# Patient Record
Sex: Female | Born: 1955 | Race: White | Hispanic: No | Marital: Married | Smoking: Current every day smoker
Health system: Southern US, Community
[De-identification: ages and names within clinical notes are randomized; demographics above are authoritative.]

## PROBLEM LIST (undated history)

## (undated) DIAGNOSIS — Z923 Personal history of irradiation: Secondary | ICD-10-CM

## (undated) DIAGNOSIS — Z720 Tobacco use: Secondary | ICD-10-CM

## (undated) DIAGNOSIS — C3491 Malignant neoplasm of unspecified part of right bronchus or lung: Secondary | ICD-10-CM

## (undated) DIAGNOSIS — C801 Malignant (primary) neoplasm, unspecified: Secondary | ICD-10-CM

## (undated) DIAGNOSIS — G4730 Sleep apnea, unspecified: Secondary | ICD-10-CM

## (undated) DIAGNOSIS — N179 Acute kidney failure, unspecified: Secondary | ICD-10-CM

## (undated) DIAGNOSIS — G629 Polyneuropathy, unspecified: Secondary | ICD-10-CM

## (undated) DIAGNOSIS — G8929 Other chronic pain: Secondary | ICD-10-CM

## (undated) DIAGNOSIS — E039 Hypothyroidism, unspecified: Secondary | ICD-10-CM

## (undated) DIAGNOSIS — F419 Anxiety disorder, unspecified: Secondary | ICD-10-CM

## (undated) DIAGNOSIS — K219 Gastro-esophageal reflux disease without esophagitis: Secondary | ICD-10-CM

## (undated) DIAGNOSIS — M797 Fibromyalgia: Secondary | ICD-10-CM

## (undated) DIAGNOSIS — R32 Unspecified urinary incontinence: Secondary | ICD-10-CM

## (undated) DIAGNOSIS — M5430 Sciatica, unspecified side: Secondary | ICD-10-CM

## (undated) DIAGNOSIS — L723 Sebaceous cyst: Secondary | ICD-10-CM

## (undated) DIAGNOSIS — F32A Depression, unspecified: Secondary | ICD-10-CM

## (undated) DIAGNOSIS — F329 Major depressive disorder, single episode, unspecified: Secondary | ICD-10-CM

## (undated) DIAGNOSIS — M549 Dorsalgia, unspecified: Secondary | ICD-10-CM

## (undated) DIAGNOSIS — M1990 Unspecified osteoarthritis, unspecified site: Secondary | ICD-10-CM

## (undated) DIAGNOSIS — R0600 Dyspnea, unspecified: Secondary | ICD-10-CM

## (undated) DIAGNOSIS — E7800 Pure hypercholesterolemia, unspecified: Secondary | ICD-10-CM

## (undated) DIAGNOSIS — J449 Chronic obstructive pulmonary disease, unspecified: Secondary | ICD-10-CM

## (undated) DIAGNOSIS — F418 Other specified anxiety disorders: Secondary | ICD-10-CM

## (undated) HISTORY — PX: OTHER SURGICAL HISTORY: SHX169

## (undated) HISTORY — DX: Gastro-esophageal reflux disease without esophagitis: K21.9

## (undated) HISTORY — DX: Malignant neoplasm of unspecified part of right bronchus or lung: C34.91

## (undated) HISTORY — DX: Other specified anxiety disorders: F41.8

## (undated) HISTORY — DX: Tobacco use: Z72.0

## (undated) HISTORY — DX: Polyneuropathy, unspecified: G62.9

## (undated) HISTORY — PX: BREAST LUMPECTOMY: SHX2

## (undated) HISTORY — DX: Personal history of irradiation: Z92.3

## (undated) HISTORY — DX: Unspecified urinary incontinence: R32

## (undated) HISTORY — DX: Sebaceous cyst: L72.3

---

## 2003-08-24 DIAGNOSIS — C801 Malignant (primary) neoplasm, unspecified: Secondary | ICD-10-CM

## 2003-08-24 HISTORY — DX: Malignant (primary) neoplasm, unspecified: C80.1

## 2003-11-27 ENCOUNTER — Other Ambulatory Visit: Admission: RE | Admit: 2003-11-27 | Discharge: 2003-11-27 | Payer: Self-pay | Admitting: Radiology

## 2003-12-01 DIAGNOSIS — C50911 Malignant neoplasm of unspecified site of right female breast: Secondary | ICD-10-CM | POA: Insufficient documentation

## 2003-12-09 ENCOUNTER — Encounter (INDEPENDENT_AMBULATORY_CARE_PROVIDER_SITE_OTHER): Payer: Self-pay | Admitting: *Deleted

## 2003-12-09 ENCOUNTER — Encounter (INDEPENDENT_AMBULATORY_CARE_PROVIDER_SITE_OTHER): Payer: Self-pay | Admitting: General Surgery

## 2003-12-09 ENCOUNTER — Encounter: Admission: RE | Admit: 2003-12-09 | Discharge: 2003-12-09 | Payer: Self-pay | Admitting: General Surgery

## 2003-12-09 ENCOUNTER — Ambulatory Visit (HOSPITAL_BASED_OUTPATIENT_CLINIC_OR_DEPARTMENT_OTHER): Admission: RE | Admit: 2003-12-09 | Discharge: 2003-12-09 | Payer: Self-pay | Admitting: General Surgery

## 2003-12-09 ENCOUNTER — Ambulatory Visit (HOSPITAL_COMMUNITY): Admission: RE | Admit: 2003-12-09 | Discharge: 2003-12-09 | Payer: Self-pay | Admitting: General Surgery

## 2003-12-31 ENCOUNTER — Ambulatory Visit: Admission: RE | Admit: 2003-12-31 | Discharge: 2004-01-27 | Payer: Self-pay | Admitting: Radiation Oncology

## 2004-01-01 ENCOUNTER — Encounter: Admission: RE | Admit: 2004-01-01 | Discharge: 2004-01-01 | Payer: Self-pay | Admitting: Oncology

## 2004-03-03 ENCOUNTER — Ambulatory Visit: Admission: RE | Admit: 2004-03-03 | Discharge: 2004-05-14 | Payer: Self-pay | Admitting: Radiation Oncology

## 2004-06-02 ENCOUNTER — Ambulatory Visit: Admission: RE | Admit: 2004-06-02 | Discharge: 2004-06-02 | Payer: Self-pay | Admitting: Radiation Oncology

## 2004-06-09 ENCOUNTER — Ambulatory Visit: Admission: RE | Admit: 2004-06-09 | Discharge: 2004-06-09 | Payer: Self-pay | Admitting: Radiation Oncology

## 2004-06-26 ENCOUNTER — Ambulatory Visit: Payer: Self-pay | Admitting: Oncology

## 2004-07-10 ENCOUNTER — Ambulatory Visit: Payer: Self-pay | Admitting: Internal Medicine

## 2004-11-03 ENCOUNTER — Ambulatory Visit: Payer: Self-pay | Admitting: Oncology

## 2004-11-11 ENCOUNTER — Ambulatory Visit: Admission: RE | Admit: 2004-11-11 | Discharge: 2004-11-11 | Payer: Self-pay | Admitting: Radiation Oncology

## 2005-03-21 ENCOUNTER — Emergency Department (HOSPITAL_COMMUNITY): Admission: EM | Admit: 2005-03-21 | Discharge: 2005-03-21 | Payer: Self-pay | Admitting: *Deleted

## 2005-05-20 ENCOUNTER — Ambulatory Visit: Payer: Self-pay | Admitting: Oncology

## 2005-07-06 ENCOUNTER — Ambulatory Visit: Payer: Self-pay | Admitting: Internal Medicine

## 2005-07-13 ENCOUNTER — Ambulatory Visit: Payer: Self-pay | Admitting: Internal Medicine

## 2005-07-19 ENCOUNTER — Ambulatory Visit: Payer: Self-pay | Admitting: *Deleted

## 2005-11-19 ENCOUNTER — Ambulatory Visit: Payer: Self-pay | Admitting: Oncology

## 2006-03-22 ENCOUNTER — Ambulatory Visit: Payer: Self-pay | Admitting: Family Medicine

## 2006-04-17 ENCOUNTER — Emergency Department (HOSPITAL_COMMUNITY): Admission: EM | Admit: 2006-04-17 | Discharge: 2006-04-17 | Payer: Self-pay | Admitting: Emergency Medicine

## 2006-05-06 ENCOUNTER — Ambulatory Visit: Payer: Self-pay | Admitting: Oncology

## 2006-05-26 ENCOUNTER — Ambulatory Visit: Payer: Self-pay | Admitting: Family Medicine

## 2006-06-01 ENCOUNTER — Ambulatory Visit: Payer: Self-pay | Admitting: Family Medicine

## 2006-06-08 ENCOUNTER — Ambulatory Visit: Payer: Self-pay | Admitting: Family Medicine

## 2006-07-19 ENCOUNTER — Ambulatory Visit: Payer: Self-pay | Admitting: Family Medicine

## 2006-08-17 ENCOUNTER — Ambulatory Visit: Payer: Self-pay | Admitting: Obstetrics & Gynecology

## 2006-08-17 DIAGNOSIS — L723 Sebaceous cyst: Secondary | ICD-10-CM

## 2006-10-18 ENCOUNTER — Ambulatory Visit: Payer: Self-pay | Admitting: Family Medicine

## 2006-11-07 ENCOUNTER — Ambulatory Visit: Payer: Self-pay | Admitting: Family Medicine

## 2006-12-09 ENCOUNTER — Ambulatory Visit: Payer: Self-pay | Admitting: Oncology

## 2006-12-14 LAB — CBC WITH DIFFERENTIAL/PLATELET
BASO%: 0.8 % (ref 0.0–2.0)
HCT: 34.7 % — ABNORMAL LOW (ref 34.8–46.6)
LYMPH%: 32.3 % (ref 14.0–48.0)
MCHC: 35.4 g/dL (ref 32.0–36.0)
MCV: 88.8 fL (ref 81.0–101.0)
MONO#: 0.5 10*3/uL (ref 0.1–0.9)
MONO%: 7.1 % (ref 0.0–13.0)
NEUT%: 57.6 % (ref 39.6–76.8)
Platelets: 225 10*3/uL (ref 145–400)
RBC: 3.9 10*6/uL (ref 3.70–5.32)
WBC: 6.6 10*3/uL (ref 3.9–10.0)

## 2007-01-06 ENCOUNTER — Encounter: Admission: RE | Admit: 2007-01-06 | Discharge: 2007-01-06 | Payer: Self-pay | Admitting: Family Medicine

## 2007-01-10 ENCOUNTER — Ambulatory Visit: Payer: Self-pay | Admitting: Family Medicine

## 2007-01-10 ENCOUNTER — Encounter (INDEPENDENT_AMBULATORY_CARE_PROVIDER_SITE_OTHER): Payer: Self-pay | Admitting: Family Medicine

## 2007-01-10 DIAGNOSIS — N8111 Cystocele, midline: Secondary | ICD-10-CM | POA: Insufficient documentation

## 2007-01-10 DIAGNOSIS — R32 Unspecified urinary incontinence: Secondary | ICD-10-CM | POA: Insufficient documentation

## 2007-01-10 LAB — CONVERTED CEMR LAB: Pap Smear: NORMAL

## 2007-01-30 ENCOUNTER — Encounter (INDEPENDENT_AMBULATORY_CARE_PROVIDER_SITE_OTHER): Payer: Self-pay | Admitting: Family Medicine

## 2007-01-30 DIAGNOSIS — F329 Major depressive disorder, single episode, unspecified: Secondary | ICD-10-CM

## 2007-01-30 DIAGNOSIS — K219 Gastro-esophageal reflux disease without esophagitis: Secondary | ICD-10-CM | POA: Insufficient documentation

## 2007-01-30 DIAGNOSIS — F17200 Nicotine dependence, unspecified, uncomplicated: Secondary | ICD-10-CM | POA: Insufficient documentation

## 2007-01-30 DIAGNOSIS — F411 Generalized anxiety disorder: Secondary | ICD-10-CM | POA: Insufficient documentation

## 2007-01-30 DIAGNOSIS — F3289 Other specified depressive episodes: Secondary | ICD-10-CM | POA: Insufficient documentation

## 2007-05-10 ENCOUNTER — Encounter (INDEPENDENT_AMBULATORY_CARE_PROVIDER_SITE_OTHER): Payer: Self-pay | Admitting: *Deleted

## 2007-06-08 ENCOUNTER — Telehealth (INDEPENDENT_AMBULATORY_CARE_PROVIDER_SITE_OTHER): Payer: Self-pay | Admitting: Internal Medicine

## 2007-06-12 ENCOUNTER — Ambulatory Visit: Payer: Self-pay | Admitting: Oncology

## 2007-07-05 ENCOUNTER — Ambulatory Visit: Payer: Self-pay | Admitting: Family Medicine

## 2007-07-13 ENCOUNTER — Telehealth (INDEPENDENT_AMBULATORY_CARE_PROVIDER_SITE_OTHER): Payer: Self-pay | Admitting: Nurse Practitioner

## 2007-07-24 ENCOUNTER — Ambulatory Visit: Payer: Self-pay | Admitting: Family Medicine

## 2007-07-24 ENCOUNTER — Telehealth (INDEPENDENT_AMBULATORY_CARE_PROVIDER_SITE_OTHER): Payer: Self-pay | Admitting: Family Medicine

## 2007-08-09 ENCOUNTER — Ambulatory Visit: Payer: Self-pay | Admitting: Oncology

## 2007-09-28 ENCOUNTER — Ambulatory Visit: Payer: Self-pay | Admitting: Oncology

## 2007-11-15 ENCOUNTER — Telehealth (INDEPENDENT_AMBULATORY_CARE_PROVIDER_SITE_OTHER): Payer: Self-pay | Admitting: Nurse Practitioner

## 2007-11-21 ENCOUNTER — Telehealth (INDEPENDENT_AMBULATORY_CARE_PROVIDER_SITE_OTHER): Payer: Self-pay | Admitting: *Deleted

## 2007-11-22 ENCOUNTER — Ambulatory Visit: Payer: Self-pay | Admitting: Family Medicine

## 2007-11-22 DIAGNOSIS — M542 Cervicalgia: Secondary | ICD-10-CM | POA: Insufficient documentation

## 2007-11-22 DIAGNOSIS — Q742 Other congenital malformations of lower limb(s), including pelvic girdle: Secondary | ICD-10-CM

## 2007-12-08 ENCOUNTER — Encounter (INDEPENDENT_AMBULATORY_CARE_PROVIDER_SITE_OTHER): Payer: Self-pay | Admitting: Family Medicine

## 2007-12-20 ENCOUNTER — Ambulatory Visit: Payer: Self-pay | Admitting: Oncology

## 2007-12-25 ENCOUNTER — Telehealth (INDEPENDENT_AMBULATORY_CARE_PROVIDER_SITE_OTHER): Payer: Self-pay | Admitting: *Deleted

## 2007-12-25 ENCOUNTER — Encounter (INDEPENDENT_AMBULATORY_CARE_PROVIDER_SITE_OTHER): Payer: Self-pay | Admitting: Family Medicine

## 2007-12-25 LAB — CBC WITH DIFFERENTIAL/PLATELET
BASO%: 0.5 % (ref 0.0–2.0)
EOS%: 2.3 % (ref 0.0–7.0)
Eosinophils Absolute: 0.2 10*3/uL (ref 0.0–0.5)
LYMPH%: 23.7 % (ref 14.0–48.0)
MCH: 31.5 pg (ref 26.0–34.0)
MCHC: 35 g/dL (ref 32.0–36.0)
MCV: 90.1 fL (ref 81.0–101.0)
MONO%: 5.2 % (ref 0.0–13.0)
NEUT#: 6 10*3/uL (ref 1.5–6.5)
Platelets: 225 10*3/uL (ref 145–400)
RBC: 3.75 10*6/uL (ref 3.70–5.32)
RDW: 13.8 % (ref 11.3–14.5)

## 2007-12-25 LAB — COMPREHENSIVE METABOLIC PANEL
ALT: <8 U/L (ref 0–35)
AST: 11 U/L (ref 0–37)
Albumin: 3.8 g/dL (ref 3.5–5.2)
Alkaline Phosphatase: 63 U/L (ref 39–117)
Potassium: 3.8 mEq/L (ref 3.5–5.3)
Sodium: 139 mEq/L (ref 135–145)
Total Bilirubin: 0.3 mg/dL (ref 0.3–1.2)
Total Protein: 6.8 g/dL (ref 6.0–8.3)

## 2008-01-23 ENCOUNTER — Ambulatory Visit: Payer: Self-pay | Admitting: Family Medicine

## 2008-01-23 DIAGNOSIS — H6120 Impacted cerumen, unspecified ear: Secondary | ICD-10-CM | POA: Insufficient documentation

## 2008-01-23 DIAGNOSIS — H9319 Tinnitus, unspecified ear: Secondary | ICD-10-CM | POA: Insufficient documentation

## 2008-01-31 ENCOUNTER — Telehealth (INDEPENDENT_AMBULATORY_CARE_PROVIDER_SITE_OTHER): Payer: Self-pay | Admitting: *Deleted

## 2008-02-07 ENCOUNTER — Encounter: Admission: RE | Admit: 2008-02-07 | Discharge: 2008-02-07 | Payer: Self-pay | Admitting: Family Medicine

## 2008-02-08 ENCOUNTER — Encounter (INDEPENDENT_AMBULATORY_CARE_PROVIDER_SITE_OTHER): Payer: Self-pay | Admitting: Family Medicine

## 2008-02-26 ENCOUNTER — Encounter (INDEPENDENT_AMBULATORY_CARE_PROVIDER_SITE_OTHER): Payer: Self-pay | Admitting: Family Medicine

## 2008-02-26 ENCOUNTER — Ambulatory Visit: Payer: Self-pay | Admitting: Family Medicine

## 2008-02-26 DIAGNOSIS — G4700 Insomnia, unspecified: Secondary | ICD-10-CM | POA: Insufficient documentation

## 2008-02-26 LAB — CONVERTED CEMR LAB
ALT: <8 units/L (ref 0–35)
AST: 11 units/L (ref 0–37)
Alkaline Phosphatase: 58 units/L (ref 39–117)
CO2: 24 meq/L (ref 19–32)
Cholesterol: 220 mg/dL — ABNORMAL HIGH (ref 0–200)
Creatinine, Ser: 0.74 mg/dL (ref 0.40–1.20)
Glucose, Urine, Semiquant: NEGATIVE
LDL Cholesterol: 124 mg/dL — ABNORMAL HIGH (ref 0–99)
Sodium: 141 meq/L (ref 135–145)
Specific Gravity, Urine: >=1.03
Total Bilirubin: 0.3 mg/dL (ref 0.3–1.2)
Total CHOL/HDL Ratio: 5.4
VLDL: 55 mg/dL — ABNORMAL HIGH (ref 0–40)
WBC Urine, dipstick: NEGATIVE
pH: 5

## 2008-02-27 LAB — CONVERTED CEMR LAB: Pap Smear: NORMAL

## 2008-03-06 ENCOUNTER — Ambulatory Visit: Payer: Self-pay | Admitting: Family Medicine

## 2008-03-08 ENCOUNTER — Telehealth (INDEPENDENT_AMBULATORY_CARE_PROVIDER_SITE_OTHER): Payer: Self-pay | Admitting: Nurse Practitioner

## 2008-03-13 ENCOUNTER — Encounter (INDEPENDENT_AMBULATORY_CARE_PROVIDER_SITE_OTHER): Payer: Self-pay | Admitting: Family Medicine

## 2008-03-14 ENCOUNTER — Encounter (INDEPENDENT_AMBULATORY_CARE_PROVIDER_SITE_OTHER): Payer: Self-pay | Admitting: Family Medicine

## 2008-03-20 ENCOUNTER — Encounter (INDEPENDENT_AMBULATORY_CARE_PROVIDER_SITE_OTHER): Payer: Self-pay | Admitting: Family Medicine

## 2008-04-15 ENCOUNTER — Telehealth (INDEPENDENT_AMBULATORY_CARE_PROVIDER_SITE_OTHER): Payer: Self-pay | Admitting: Nurse Practitioner

## 2008-04-28 ENCOUNTER — Emergency Department (HOSPITAL_COMMUNITY): Admission: EM | Admit: 2008-04-28 | Discharge: 2008-04-28 | Payer: Self-pay | Admitting: Emergency Medicine

## 2008-04-30 ENCOUNTER — Telehealth (INDEPENDENT_AMBULATORY_CARE_PROVIDER_SITE_OTHER): Payer: Self-pay | Admitting: *Deleted

## 2008-05-06 ENCOUNTER — Ambulatory Visit: Payer: Self-pay | Admitting: Family Medicine

## 2008-05-16 ENCOUNTER — Ambulatory Visit (HOSPITAL_COMMUNITY): Admission: RE | Admit: 2008-05-16 | Discharge: 2008-05-16 | Payer: Self-pay | Admitting: Pediatrics

## 2008-05-17 ENCOUNTER — Encounter (INDEPENDENT_AMBULATORY_CARE_PROVIDER_SITE_OTHER): Payer: Self-pay | Admitting: Family Medicine

## 2008-09-05 ENCOUNTER — Ambulatory Visit (HOSPITAL_COMMUNITY): Admission: RE | Admit: 2008-09-05 | Discharge: 2008-09-05 | Payer: Self-pay | Admitting: Pediatrics

## 2008-09-25 ENCOUNTER — Encounter (HOSPITAL_COMMUNITY): Admission: RE | Admit: 2008-09-25 | Discharge: 2008-10-25 | Payer: Self-pay | Admitting: Pediatrics

## 2008-10-03 ENCOUNTER — Ambulatory Visit (HOSPITAL_COMMUNITY): Admission: RE | Admit: 2008-10-03 | Discharge: 2008-10-03 | Payer: Self-pay | Admitting: Pediatrics

## 2008-10-04 ENCOUNTER — Ambulatory Visit (HOSPITAL_COMMUNITY): Admission: RE | Admit: 2008-10-04 | Discharge: 2008-10-04 | Payer: Self-pay | Admitting: Urology

## 2008-11-04 ENCOUNTER — Ambulatory Visit (HOSPITAL_COMMUNITY): Admission: RE | Admit: 2008-11-04 | Discharge: 2008-11-04 | Payer: Self-pay | Admitting: Pediatrics

## 2008-12-19 ENCOUNTER — Ambulatory Visit: Payer: Self-pay | Admitting: Oncology

## 2009-05-02 ENCOUNTER — Ambulatory Visit: Payer: Self-pay | Admitting: Oncology

## 2009-05-06 LAB — CBC WITH DIFFERENTIAL/PLATELET
BASO%: 0.4 % (ref 0.0–2.0)
Basophils Absolute: 0 10*3/uL (ref 0.0–0.1)
HCT: 42.1 % (ref 34.8–46.6)
HGB: 14.2 g/dL (ref 11.6–15.9)
MONO#: 0.4 10*3/uL (ref 0.1–0.9)
NEUT%: 73.7 % (ref 38.4–76.8)
RDW: 13.2 % (ref 11.2–14.5)
WBC: 9.4 10*3/uL (ref 3.9–10.3)
lymph#: 1.9 10*3/uL (ref 0.9–3.3)

## 2009-05-06 LAB — COMPREHENSIVE METABOLIC PANEL
AST: 19 U/L (ref 0–37)
Albumin: 3.7 g/dL (ref 3.5–5.2)
BUN: 7 mg/dL (ref 6–23)
Calcium: 9.1 mg/dL (ref 8.4–10.5)
Chloride: 107 mEq/L (ref 96–112)
Glucose, Bld: 119 mg/dL — ABNORMAL HIGH (ref 70–99)
Potassium: 3.5 mEq/L (ref 3.5–5.3)
Sodium: 143 mEq/L (ref 135–145)
Total Protein: 6.9 g/dL (ref 6.0–8.3)

## 2010-01-05 ENCOUNTER — Emergency Department (HOSPITAL_COMMUNITY): Admission: EM | Admit: 2010-01-05 | Discharge: 2010-01-05 | Payer: Self-pay | Admitting: Emergency Medicine

## 2010-04-28 ENCOUNTER — Inpatient Hospital Stay (HOSPITAL_COMMUNITY): Admission: RE | Admit: 2010-04-28 | Discharge: 2010-04-29 | Payer: Self-pay | Admitting: Urology

## 2010-04-28 ENCOUNTER — Ambulatory Visit: Payer: Self-pay | Admitting: Cardiology

## 2010-06-17 ENCOUNTER — Ambulatory Visit (HOSPITAL_COMMUNITY): Admission: RE | Admit: 2010-06-17 | Discharge: 2010-06-17 | Payer: Self-pay | Admitting: Pediatrics

## 2010-09-08 ENCOUNTER — Emergency Department (HOSPITAL_COMMUNITY)
Admission: EM | Admit: 2010-09-08 | Discharge: 2010-09-08 | Payer: Self-pay | Source: Home / Self Care | Admitting: Emergency Medicine

## 2010-09-13 ENCOUNTER — Encounter: Payer: Self-pay | Admitting: Pediatrics

## 2010-09-13 ENCOUNTER — Encounter: Payer: Self-pay | Admitting: Family Medicine

## 2010-09-14 ENCOUNTER — Encounter: Payer: Self-pay | Admitting: Oncology

## 2010-10-23 ENCOUNTER — Encounter: Payer: Self-pay | Admitting: Internal Medicine

## 2010-10-23 ENCOUNTER — Telehealth (INDEPENDENT_AMBULATORY_CARE_PROVIDER_SITE_OTHER): Payer: Self-pay | Admitting: *Deleted

## 2010-10-27 ENCOUNTER — Encounter: Payer: Self-pay | Admitting: Internal Medicine

## 2010-10-29 NOTE — Progress Notes (Signed)
  Phone Note Outgoing Call Call back at [phone]   Call placed by: Carolan Clines  Summary of Call: phone answered by room mate and message left to return call when pt arrives home     Appended Document:  pt returned call and tcs scrrening complete and forwarded to scheduler

## 2010-11-03 NOTE — Letter (Signed)
Summary: TRIAGE ORDER  TRIAGE ORDER   Imported By: Ave Filter 10/23/2010 16:44:36  _____________________________________________________________________  External Attachment:    Type:   Image     Comment:   External Document  Appended Document: TRIAGE ORDER ok as is  Appended Document: TRIAGE ORDER Instrctions mailed to pt.

## 2010-11-03 NOTE — Letter (Signed)
Summary: [facility] INSTRUCTIONS  [facility] INSTRUCTIONS   Imported By: Ave Filter 10/27/2010 08:56:08  _____________________________________________________________________  External Attachment:    Type:   Image     Comment:   External Document

## 2010-11-05 LAB — SURGICAL PCR SCREEN
MRSA, PCR: NEGATIVE
Staphylococcus aureus: NEGATIVE

## 2010-11-05 LAB — CARDIAC PANEL(CRET KIN+CKTOT+MB+TROPI)
CK, MB: 0.9 ng/mL (ref 0.3–4.0)
CK, MB: 0.9 ng/mL (ref 0.3–4.0)
Relative Index: INVALID (ref 0.0–2.5)
Total CK: 58 U/L (ref 7–177)
Troponin I: <0.01 ng/mL (ref 0.00–0.06)

## 2010-11-05 LAB — CBC
MCH: 30.7 pg (ref 26.0–34.0)
MCV: 90.8 fL (ref 78.0–100.0)
Platelets: 255 10*3/uL (ref 150–400)
RDW: 13.1 % (ref 11.5–15.5)
WBC: 8.5 10*3/uL (ref 4.0–10.5)

## 2010-11-05 LAB — BASIC METABOLIC PANEL
BUN: 3 mg/dL — ABNORMAL LOW (ref 6–23)
Calcium: 9.6 mg/dL (ref 8.4–10.5)
Chloride: 102 mEq/L (ref 96–112)
Creatinine, Ser: 0.64 mg/dL (ref 0.4–1.2)
GFR calc Af Amer: >60 mL/min (ref >60)
GFR calc non Af Amer: >60 mL/min (ref >60)
Potassium: 3.4 mEq/L — ABNORMAL LOW (ref 3.5–5.1)
Sodium: 140 mEq/L (ref 135–145)

## 2010-11-05 LAB — D-DIMER, QUANTITATIVE: D-Dimer, Quant: 0.46 ug/mL-FEU (ref 0.00–0.48)

## 2010-11-11 ENCOUNTER — Other Ambulatory Visit: Payer: Self-pay | Admitting: Internal Medicine

## 2010-11-11 ENCOUNTER — Ambulatory Visit (HOSPITAL_COMMUNITY)
Admission: RE | Admit: 2010-11-11 | Discharge: 2010-11-11 | Disposition: A | Discharge status: Home / Self Care | Payer: Medicaid Other | Source: Ambulatory Visit | Attending: Internal Medicine | Admitting: Internal Medicine

## 2010-11-11 ENCOUNTER — Encounter: Payer: Medicaid Other | Admitting: Internal Medicine

## 2010-11-11 DIAGNOSIS — D126 Benign neoplasm of colon, unspecified: Secondary | ICD-10-CM | POA: Insufficient documentation

## 2010-11-11 DIAGNOSIS — K219 Gastro-esophageal reflux disease without esophagitis: Secondary | ICD-10-CM

## 2010-11-11 DIAGNOSIS — R1310 Dysphagia, unspecified: Secondary | ICD-10-CM

## 2010-11-11 DIAGNOSIS — K449 Diaphragmatic hernia without obstruction or gangrene: Secondary | ICD-10-CM | POA: Insufficient documentation

## 2010-11-11 DIAGNOSIS — Z1211 Encounter for screening for malignant neoplasm of colon: Secondary | ICD-10-CM

## 2010-11-11 HISTORY — PX: ESOPHAGOGASTRODUODENOSCOPY: SHX1529

## 2010-11-11 HISTORY — PX: COLONOSCOPY: SHX174

## 2010-11-16 ENCOUNTER — Encounter: Payer: Self-pay | Admitting: Gastroenterology

## 2010-11-17 NOTE — Op Note (Signed)
NAME:  Sydney Blair, Sydney Blair           ACCOUNT NO.:  [id number]  MEDICAL RECORD NO.:  [id number]           PATIENT TYPE:  O  LOCATION:  [facility]                          FACILITY:  [facility]  PHYSICIAN:  R. Roetta Sessions, M.D. DATE OF BIRTH:  [birth year]  DATE OF PROCEDURE:  11/10/2010 DATE OF DISCHARGE:                              OPERATIVE REPORT   INDICATIONS FOR PROCEDURE:  This is a 55 year old lady referred at courtesy Dr. Ancil Boozer for first ever screening colonoscopy.  She has no lower GI tract symptoms.  No family history of colon cancer or colon polyps.  This approach has been discussed with the patient the risks, benefits, limitations, alternatives, and ponderables..  Sydney Blair relates that she has had longstanding GERD, well controlled on Nexium/omeprazole, but over the past 3 months developed esophageal dysphagia to solids and liquids and wonders if she could have her upper GI tract evaluated at the same time.  This approach is not unreasonable. I did discuss the possibility of EGD with dilation, etc., versus alternative of pill esophagram with this nice lady.  We talked about the risks, benefits, and alternatives of the EGD.  She is desirous of having an EGD with intervention as appropriate today at the time as her colonoscopy.  Please see the documentation of medical record.  PROCEDURE NOTE:  O2 saturation, blood pressure, pulse, and respirations were monitored throughout the entire procedure.  CONSCIOUS SEDATION:  Versed 7 mg IV, Demerol 150 mg IV in divided doses, Cetacaine spray for topical pharyngeal anesthesia.  INSTRUMENTATION:  Pentax video chip system.  FINDINGS:  EGD examination of tubular esophagus revealed no mucosal abnormalities.  Esophagus.  The tubular esophagus was patent through the EG junction.  Stomach:  Gas cavity was emptied and insufflated well with air. Thorough examination of the gastric mucosa including retroflexion of the proximal stomach  and esophagogastric junction demonstrated only a small hiatal hernia.  The gastric mucosa was well seen and appeared normal, otherwise.  Pylorus was patent and easily traversed.  Examination of the bulb and second portion revealed no abnormalities.  Therapeutic/diagnostic maneuvers performed:  Scope was withdrawn.  A 56- French Maloney dye was passed to full insertion with ease.  Look back revealed no apparent complication related to passage of the dilator. The patient tolerated the procedure well and was prepared for colonoscopy.  Digital rectal exam revealed no abnormalities.  Endoscopic findings:  Prep was adequate.  Colon:  Colonic mucosa was surveyed from the rectosigmoid junction through the left transverse, right colon, through the appendiceal orifice, ileocecal valve/cecum.  These structures were well seen and photographed for the record.  Terminal ileum was intubated to 5 cm. From this level, scope was slowly cautiously withdrawn.  All previous mentioned mucosal surfaces were again seen.  The patient was noted to have a 7-mm polyp at the splenic flexure with hot snare removed.  The remainder of colonic mucosa appeared normal.  Scope was pulled down into the rectum.  A thorough examination of the rectal mucosa including retroflexed view of the anal verge demonstrated 2 distal diminutive polyps which were ablated with the tip of the hot snare cautery unit.  No other rectal mucosal abnormalities were seen.  The patient tolerated the procedure well.  Cecal withdrawal time 16 minutes.  IMPRESSION: 1. Esophagogastroduodenoscopy, small hiatal hernia, otherwise normal     upper gastrointestinal tract status with passage of 56-French     Maloney dilator.  Colonoscopy findings, diminutive rectal polyp     status post hot snare ablation, otherwise normal rectum. 2. Polyp at splenic flexure status post snare polypectomy.  Remaining     colonic mucosa appeared  normal.  RECOMMENDATIONS: 1. Literature on reflux, hiatal hernia, and polyps provided to Ms.     Turkey today. 2. She is to continue her Nexium. 3. Follow up on path. 4. Further recommendations to follow.     Jonathon Bellows, M.D.     RMR/MEDQ  D:  11/11/2010  T:  11/11/2010  Job:  [id number]  cc:   Ancil Boozer, MD Fax: [phone]  Electronically Signed by Lorrin Goodell M.D. on 11/17/2010 11:23:44 AM

## 2010-12-07 ENCOUNTER — Ambulatory Visit: Payer: Medicaid Other | Attending: Pediatrics

## 2010-12-07 DIAGNOSIS — R0989 Other specified symptoms and signs involving the circulatory and respiratory systems: Secondary | ICD-10-CM | POA: Insufficient documentation

## 2010-12-07 DIAGNOSIS — R0609 Other forms of dyspnea: Secondary | ICD-10-CM | POA: Insufficient documentation

## 2010-12-07 DIAGNOSIS — R5381 Other malaise: Secondary | ICD-10-CM | POA: Insufficient documentation

## 2010-12-07 DIAGNOSIS — R5383 Other fatigue: Secondary | ICD-10-CM | POA: Insufficient documentation

## 2010-12-07 DIAGNOSIS — G4733 Obstructive sleep apnea (adult) (pediatric): Secondary | ICD-10-CM | POA: Insufficient documentation

## 2010-12-08 NOTE — Procedures (Signed)
NAME:  Sydney Blair, Sydney Blair           ACCOUNT NO.:  [id number]  MEDICAL RECORD NO.:  [id number]          PATIENT TYPE:  OUT  LOCATION:  SLEEP LAB                     FACILITY:  [facility]  PHYSICIAN:  Shavonna Corella A. Gerilyn Pilgrim, M.D. DATE OF BIRTH:  March 25, 1956  DATE OF STUDY:  12/07/2010                           NOCTURNAL POLYSOMNOGRAM  REFERRING PHYSICIAN:  Francoise Schaumann. Halm, DO, FAAP  REFERRING PHYSICIAN:  Francoise Schaumann. Halm, DO, FAAP.  RECORDING DATE:  December 07, 2010.  INDICATIONS:  A 55 year old who presents with fatigue, snoring and difficulty sleeping.  This has been done to evaluate for obstructive sleep apnea syndrome.  INDICATION FOR STUDY:  EPWORTH SLEEPINESS SCORE:  MEDICATIONS:  Clonazepam, tramadol, Atrovent, omeprazole, Flonase, prednisone, albuterol.  EPWORTH SLEEPINESS SCALE:  5.  BMI:  29.  ARCHITECTURAL SUMMARY:  Total recording time was 4-21 minutes.  Sleep efficiency 89%.  Sleep latency 13 minutes.  REM latency 0.  Stage N1 4.3%, N2 is 59.6%, N3 36.2% and REM sleep 0.  RESPIRATORY SUMMARY:  Baseline oxygen saturation is 90, lowest saturation 81 during non REM sleep.  Diagnostic AHI is 20 and RDI 21.  LIMB MOVEMENT SUMMARY:  PLM index 0.  ELECTROCARDIOGRAM SUMMARY:  Average heart rate 78 with no significant dysrhythmias observed.  IMPRESSION: 1. Moderate obstructive sleep apnea syndrome. 2. Abnormal sleep architecture with significantly increased slow wave     sleep suggestive of sleep deprivation.  RECOMMENDATIONS:  Formal CPAP titration study.  SLEEP ARCHITECTURE:  RESPIRATORY DATA:  OXYGEN DATA:  CARDIAC DATA:  MOVEMENT-PARASOMNIA:  IMPRESSIONS-RECOMMENDATIONS:     [name] A. Gerilyn Pilgrim, M.D. Electronically Signed 12/09/2010 10:03:08    KAD/MEDQ  D:  12/08/2010 09:23:55  T:  12/08/2010 21:43:36  Job:  [id number]

## 2011-01-08 ENCOUNTER — Other Ambulatory Visit (HOSPITAL_COMMUNITY): Payer: Self-pay | Admitting: Pediatrics

## 2011-01-08 DIAGNOSIS — N63 Unspecified lump in unspecified breast: Secondary | ICD-10-CM

## 2011-01-08 DIAGNOSIS — Z780 Asymptomatic menopausal state: Secondary | ICD-10-CM

## 2011-01-08 NOTE — Group Therapy Note (Signed)
NAME:  Sydney Blair, Sydney Blair NO.:  [id number]   MEDICAL RECORD NO.:  [id number]          PATIENT TYPE:  WOC   LOCATION:  [facility]                   FACILITY:  [facility]   PHYSICIAN:  Dorthula Perfect, MD     DATE OF BIRTH:  1956-06-22   DATE OF SERVICE:                                  CLINIC NOTE   A 55 year old white female gravida 2, para 2, last menstrual period  2005, is referred here from [facility] for evaluation of a left labial  cyst.  In 2005 she had a right breast lumpectomy performed for breast  cancer.  She has received chemotherapy and radiation therapy.  She is on  tamoxifen. She is followed by Dr. Darnelle Catalan as far as the breast cancer.  She thinks this area on the left labia has been present for at least 6  months.  It is nontender.  It has not changed in size.   She has been menopausal since 2005 and has had no vaginal bleeding or  spotting.  Her last Pap smear was 1 year ago, and by her history, was  normal.   PHYSICAL EXAM:  Weight 167, height 5 feet 3 inches.  Blood pressure  151/85.  ABDOMEN:  Flat and nontender.  No masses are felt.  Both inguinal areas  are completely normal.  No adenopathy is noted.  PELVIC:  External genitalia were examined.  In the upper left labia is  an area that is about 13-14 mm by 6-7 mm.  It is nontender.  It is  freely mobile.  It is not superficial.  It feels like a sebaceous cyst.  I had Dr. Okey Dupre also examine her and he feels the same way.  The rest of  her pelvic exam was not performed.   DIAGNOSES:  Sebaceous cyst, left labia.   DISPOSITION:  1. Reassurance.  2. The patient is asked to return if this area gets markedly larger      and/or symptomatic, such as being painful or tender.           ______________________________  Dorthula Perfect, MD     ER/MEDQ  D:  08/17/2006  T:  08/17/2006  Job:  [id number]   cc:   [facility]

## 2011-01-08 NOTE — Op Note (Signed)
NAMEANAEL, ROSCH                ACCOUNT NO.:  [id number]   MEDICAL RECORD NO.:  [id number]                   PATIENT TYPE:  AMB   LOCATION:  [facility]                                  FACILITY:  [facility]   PHYSICIAN:  Rose Phi. Maple Hudson, M.D.                DATE OF BIRTH:  1956-01-29   DATE OF PROCEDURE:  12/09/2003  DATE OF DISCHARGE:                                 OPERATIVE REPORT   PREOPERATIVE DIAGNOSIS:  Stage I carcinoma of the right breast.   POSTOPERATIVE DIAGNOSIS:  Stage I carcinoma of the right breast.   OPERATION PERFORMED:  1. Blue dye injection.  2. Right sentinel lymph node biopsy.  3. Right partial mastectomy.   SURGEON:  Rose Phi. Maple Hudson, M.D.   ANESTHESIA:  General.   DESCRIPTION OF PROCEDURE:  Prior to coming to the operating room 1 mCi of  technetium sulfur colloid was injected intradermally.  After suitable  general anesthesia was induced, 5 mL of a mixture of 2 mL of methylene blue  and 3 mL of injectable saline were injected in the subareolar tissue and the  breast gently massaged for about three minutes.  With both arms extended on  the arm board, the breast and axilla were prepped and draped in the standard  fashion.  A short transverse right axillary incision was made with  dissection through the subcutaneous tissue to the clavipectoral fascia.  Just deep to the clavipectoral fascia, one could see a blue lymphatic going  into a fairly small blue and hot lymph node.  The counts were in excess of  1000.  The lymphatics were clipped and the node removed. There were no other  blue, hot or palpable nodes.  That was submitted to the pathologist.   While that was being evaluated, a curved incision incorporating a wedge of  skin and the palpable mass in the upper inner quadrant of the right breast  was made and a wide excision of the mass was carried out.  Specimen was  oriented for the pathologist.  Touch Preps for the margins were clean and  Touch Preps  showed the lymph node to be negative.  Both incisions were then  injected with 0.25% Marcaine.  They both were closed in two layers with 3-0  Vicryl and subcuticular 4-0 Monocryl and Steri-Strips.  Dressing applied.  The patient was then transferred to the recovery room in satisfactory  condition having tolerated the procedure well.                                               Rose Phi. Maple Hudson, M.D.    PRY/MEDQ  D:  12/09/2003  T:  12/10/2003  Job:  [id number]

## 2011-01-13 ENCOUNTER — Ambulatory Visit (HOSPITAL_COMMUNITY): Payer: Medicaid Other

## 2011-01-13 ENCOUNTER — Other Ambulatory Visit (HOSPITAL_COMMUNITY): Payer: Medicaid Other

## 2011-01-20 ENCOUNTER — Encounter (HOSPITAL_COMMUNITY): Payer: Self-pay

## 2011-01-20 ENCOUNTER — Ambulatory Visit (HOSPITAL_COMMUNITY)
Admission: RE | Admit: 2011-01-20 | Discharge: 2011-01-20 | Disposition: A | Discharge status: Home / Self Care | Payer: Medicaid Other | Source: Ambulatory Visit | Attending: Pediatrics | Admitting: Pediatrics

## 2011-01-20 DIAGNOSIS — M899 Disorder of bone, unspecified: Secondary | ICD-10-CM | POA: Insufficient documentation

## 2011-01-20 DIAGNOSIS — Z780 Asymptomatic menopausal state: Secondary | ICD-10-CM

## 2011-01-20 DIAGNOSIS — N63 Unspecified lump in unspecified breast: Secondary | ICD-10-CM | POA: Insufficient documentation

## 2011-01-20 DIAGNOSIS — M949 Disorder of cartilage, unspecified: Secondary | ICD-10-CM | POA: Insufficient documentation

## 2011-01-20 HISTORY — DX: Malignant (primary) neoplasm, unspecified: C80.1

## 2011-07-08 ENCOUNTER — Ambulatory Visit: Payer: Medicaid Other | Admitting: Gastroenterology

## 2011-07-20 ENCOUNTER — Encounter: Payer: Self-pay | Admitting: Gastroenterology

## 2011-07-20 ENCOUNTER — Ambulatory Visit (INDEPENDENT_AMBULATORY_CARE_PROVIDER_SITE_OTHER): Payer: Medicaid Other | Admitting: Gastroenterology

## 2011-07-20 ENCOUNTER — Other Ambulatory Visit: Payer: Self-pay | Admitting: Gastroenterology

## 2011-07-20 VITALS — BP 129/77 | HR 87 | Temp 97.0°F | Ht 62.0 in | Wt 189.2 lb

## 2011-07-20 DIAGNOSIS — K219 Gastro-esophageal reflux disease without esophagitis: Secondary | ICD-10-CM

## 2011-07-20 DIAGNOSIS — D369 Benign neoplasm, unspecified site: Secondary | ICD-10-CM | POA: Insufficient documentation

## 2011-07-20 MED ORDER — DEXLANSOPRAZOLE 60 MG PO CPDR: 60.0000 mg | DELAYED_RELEASE_CAPSULE | Freq: Every day | ORAL | Status: DC

## 2011-07-20 MED ORDER — PANTOPRAZOLE SODIUM 40 MG PO TBEC: 40.0000 mg | DELAYED_RELEASE_TABLET | Freq: Every day | ORAL | Status: DC

## 2011-07-20 NOTE — Progress Notes (Signed)
Referring Provider: Ara Kussmaul, MD Primary Care Physician:  Ara Kussmaul, MD Primary Gastroenterologist: Dr. Jena Gauss   Chief Complaint  Patient presents with  . Dysphagia    HPI:   Sydney Blair presents today regarding concerns of dysphagia. She recently had an endoscopy and colonoscopy in March of this year. Findings outlined in PSH. No web, ring, or stricture noted on EGD. She was empirically dilated and did not improvement in symptoms. She will need a surveillance colonoscopy in 2017 due to findings of tubular adenoma. She reports intermittent dysphagia, pointing to throat. Question of oropharyngeal component. Difficulty with tough textures. No abdominal pain, nausea, vomiting, melena, or hematochezia. Denies any wt loss or lack of appetite. Does continue to have reflux, and she had done quite well with Nexium in the past. However, Medicaid is not covering this. She has been taking Prilosec for close eo a year. Complains of nocturnal reflux. Trying to avoid eating before bed.    Past Medical History  Diagnosis Date  . Cancer breast  . Incontinence   . Sebaceous cyst     lt labial location  . Tobacco abuse   . GERD (gastroesophageal reflux disease)   . Depression with anxiety     Past Surgical History  Procedure Date  . Breast lumpectomy     right with node dissection  . Esophagogastroduodenoscopy 11/11/2010    small hiatal hernia, 56 French dilator  . Colonoscopy 11/11/2010    diminutive rectal polyp otherwise normal; pathology showed  tubular adenoma  . Bladder tack     Current Outpatient Prescriptions  Medication Sig Dispense Refill  . albuterol (PROVENTIL HFA;VENTOLIN HFA) 108 (90 BASE) MCG/ACT inhaler Inhale 2 puffs into the lungs every 6 (six) hours as needed.        . ARIPiprazole (ABILIFY) 5 MG tablet Take 5 mg by mouth daily.        . budesonide-formoterol (SYMBICORT) 80-4.5 MCG/ACT inhaler Inhale 2 puffs into the lungs 2 (two) times daily.        .  butalbital-acetaminophen-caffeine (FIORICET, ESGIC) 50-325-40 MG per tablet Take 1 tablet by mouth 2 (two) times daily as needed.        . clonazePAM (KLONOPIN) 2 MG tablet Take 2 mg by mouth 2 (two) times daily as needed.        Marland Kitchen FLUoxetine (PROZAC) 40 MG capsule Take 40 mg by mouth daily.        Marland Kitchen ipratropium-albuterol (DUONEB) 0.5-2.5 (3) MG/3ML SOLN Take 3 mLs by nebulization.        Marland Kitchen omeprazole (PRILOSEC) 20 MG capsule Take 20 mg by mouth daily.        . predniSONE (DELTASONE) 10 MG tablet Take 10 mg by mouth daily.        . pregabalin (LYRICA) 150 MG capsule Take 150 mg by mouth 2 (two) times daily.        . simvastatin (ZOCOR) 10 MG tablet Take 10 mg by mouth at bedtime.        . traMADol (ULTRAM) 50 MG tablet Take 50 mg by mouth every 6 (six) hours as needed. Maximum dose= 8 tablets per day       . pantoprazole (PROTONIX) 40 MG tablet Take 1 tablet (40 mg total) by mouth daily. Take 30 minutes before breakfast.  30 tablet  1    Allergies as of 07/20/2011 - Review Complete 07/20/2011  Allergen Reaction Noted  . Penicillins Hives and Swelling     Family History  Problem  Relation Age of Onset  . Colon cancer Neg Hx     History   Social History  . Marital Status: Married    Spouse Name: N/A    Number of Children: N/A  . Years of Education: N/A   Social History Main Topics  . Smoking status: Current Everyday Smoker -- 1.0 packs/day    Types: Cigarettes  . Smokeless tobacco: None   Comment: started at age 57  . Alcohol Use: No  . Drug Use: No  . Sexually Active: None   Other Topics Concern  . None   Social History Narrative  . None    Review of Systems: Gen: Denies fever, chills, anorexia. Denies fatigue, weakness, weight loss.  CV: Denies chest pain, palpitations, syncope, peripheral edema, and claudication. Resp: Denies dyspnea at rest, cough, wheezing, coughing up blood, and pleurisy. GI: SEE HPI Derm: Denies rash, itching, dry skin Psych: Denies depression,  anxiety, memory loss, confusion. No homicidal or suicidal ideation.  Heme: Denies bruising, bleeding, and enlarged lymph nodes.  Physical Exam: BP 129/77  Pulse 87  Temp(Src) 97 F (36.1 C) (Temporal)  Ht 5\' 2"  (1.575 m)  Wt 189 lb 3.2 oz (85.821 kg)  BMI 34.61 kg/m2 General:   Alert and oriented. No distress noted. Pleasant and cooperative.  Head:  Normocephalic and atraumatic. Eyes:  Conjuctiva clear without scleral icterus. Mouth:  Oral mucosa pink and moist. Good dentition. No lesions. Neck:  Supple, without mass or thyromegaly. Heart:  S1, S2 present without murmurs, rubs, or gallops. Regular rate and rhythm. Abdomen:  +BS, soft, non-tender and non-distended. No rebound or guarding. No HSM or masses noted. Msk:  Symmetrical without gross deformities. Normal posture. Extremities:  Without edema. Neurologic:  Alert and  oriented x4;  grossly normal neurologically. Skin:  Intact without significant lesions or rashes. Cervical Nodes:  No significant cervical adenopathy. Psych:  Alert and cooperative. Normal mood and affect.

## 2011-07-20 NOTE — Assessment & Plan Note (Signed)
March 2012 last colonoscopy. Due for surveillance 2017.

## 2011-07-20 NOTE — Patient Instructions (Addendum)
We have set you up for an xray to look at your esophagus and stomach. We will be calling you with these results.  For now, stop taking Prilosec (Omeprazole). Start taking Protonix 40 mg daily that we have sent to your pharmacy. Your insurance will only cover this for now. However, if you see no improvement in 30 days, we can then try Dexilant or Nexium. .   We will be in touch shortly! Please review the reflux diet.

## 2011-07-20 NOTE — Assessment & Plan Note (Signed)
55 year old female with intermittent GERD, on Prilosec daily. Better results with Nexium in the past. Last EGD March 2012 with empiric dilation. Complains of dysphagia now, question of oropharyngeal component. No melena noted, no N/V, wt loss, lack of appetite. Due to recent EGD, will assess with UGI/BPE. ?esophageal dysmotility. Will also switch to Protonix. She will need to take this for 30 days prior to consideration of non-preferred PPIs such as Nexium, Dexilant, Aciphex, etc. GERD diet, avoid eating late at night. Further recommendations after BPE.

## 2011-07-21 NOTE — Progress Notes (Signed)
Cc to PCP 

## 2011-07-27 ENCOUNTER — Ambulatory Visit (HOSPITAL_COMMUNITY)
Admission: RE | Admit: 2011-07-27 | Discharge: 2011-07-27 | Disposition: A | Discharge status: Home / Self Care | Payer: Medicaid Other | Source: Ambulatory Visit | Attending: Gastroenterology | Admitting: Gastroenterology

## 2011-07-27 DIAGNOSIS — K219 Gastro-esophageal reflux disease without esophagitis: Secondary | ICD-10-CM | POA: Insufficient documentation

## 2011-07-27 DIAGNOSIS — R1310 Dysphagia, unspecified: Secondary | ICD-10-CM | POA: Insufficient documentation

## 2011-07-27 DIAGNOSIS — K224 Dyskinesia of esophagus: Secondary | ICD-10-CM | POA: Insufficient documentation

## 2011-07-29 NOTE — Progress Notes (Signed)
Quick Note:  Overall looks good. Minimal esophageal dysmotility as I suspected. She needs to continue with Protonix as planned. If no improvement in next few weeks, we can try a PA for another PPI agent.   For now, needs to chew well, take small bites, drink small sips of water if needed with meals. Sit upright after meals.   Follow-up in 3 mos. ______

## 2011-08-02 ENCOUNTER — Encounter: Payer: Self-pay | Admitting: Internal Medicine

## 2011-08-02 NOTE — Progress Notes (Signed)
Quick Note:  Pt aware. Please nic follow up in 3 months. ______

## 2011-08-06 ENCOUNTER — Inpatient Hospital Stay (HOSPITAL_COMMUNITY)
Admission: EM | Admit: 2011-08-06 | Discharge: 2011-08-08 | DRG: 194 | Disposition: A | Discharge status: Home / Self Care | Payer: Medicaid Other | Attending: Internal Medicine | Admitting: Internal Medicine

## 2011-08-06 ENCOUNTER — Other Ambulatory Visit: Payer: Self-pay

## 2011-08-06 ENCOUNTER — Emergency Department (HOSPITAL_COMMUNITY): Payer: Medicaid Other

## 2011-08-06 ENCOUNTER — Encounter (HOSPITAL_COMMUNITY): Payer: Self-pay | Admitting: Emergency Medicine

## 2011-08-06 DIAGNOSIS — R509 Fever, unspecified: Secondary | ICD-10-CM

## 2011-08-06 DIAGNOSIS — IMO0002 Reserved for concepts with insufficient information to code with codable children: Secondary | ICD-10-CM

## 2011-08-06 DIAGNOSIS — F329 Major depressive disorder, single episode, unspecified: Secondary | ICD-10-CM

## 2011-08-06 DIAGNOSIS — M609 Myositis, unspecified: Secondary | ICD-10-CM

## 2011-08-06 DIAGNOSIS — K219 Gastro-esophageal reflux disease without esophagitis: Secondary | ICD-10-CM

## 2011-08-06 DIAGNOSIS — H9319 Tinnitus, unspecified ear: Secondary | ICD-10-CM

## 2011-08-06 DIAGNOSIS — G4700 Insomnia, unspecified: Secondary | ICD-10-CM

## 2011-08-06 DIAGNOSIS — M542 Cervicalgia: Secondary | ICD-10-CM

## 2011-08-06 DIAGNOSIS — J101 Influenza due to other identified influenza virus with other respiratory manifestations: Secondary | ICD-10-CM

## 2011-08-06 DIAGNOSIS — L723 Sebaceous cyst: Secondary | ICD-10-CM

## 2011-08-06 DIAGNOSIS — F411 Generalized anxiety disorder: Secondary | ICD-10-CM

## 2011-08-06 DIAGNOSIS — C50911 Malignant neoplasm of unspecified site of right female breast: Secondary | ICD-10-CM | POA: Diagnosis present

## 2011-08-06 DIAGNOSIS — F17200 Nicotine dependence, unspecified, uncomplicated: Secondary | ICD-10-CM

## 2011-08-06 DIAGNOSIS — N179 Acute kidney failure, unspecified: Secondary | ICD-10-CM

## 2011-08-06 DIAGNOSIS — H6120 Impacted cerumen, unspecified ear: Secondary | ICD-10-CM

## 2011-08-06 DIAGNOSIS — F3289 Other specified depressive episodes: Secondary | ICD-10-CM

## 2011-08-06 DIAGNOSIS — C50919 Malignant neoplasm of unspecified site of unspecified female breast: Secondary | ICD-10-CM

## 2011-08-06 DIAGNOSIS — J09X2 Influenza due to identified novel influenza A virus with other respiratory manifestations: Principal | ICD-10-CM | POA: Diagnosis present

## 2011-08-06 DIAGNOSIS — Q742 Other congenital malformations of lower limb(s), including pelvic girdle: Secondary | ICD-10-CM

## 2011-08-06 DIAGNOSIS — Z853 Personal history of malignant neoplasm of breast: Secondary | ICD-10-CM

## 2011-08-06 DIAGNOSIS — E860 Dehydration: Secondary | ICD-10-CM

## 2011-08-06 DIAGNOSIS — R32 Unspecified urinary incontinence: Secondary | ICD-10-CM

## 2011-08-06 DIAGNOSIS — J441 Chronic obstructive pulmonary disease with (acute) exacerbation: Secondary | ICD-10-CM

## 2011-08-06 DIAGNOSIS — IMO0001 Reserved for inherently not codable concepts without codable children: Secondary | ICD-10-CM | POA: Diagnosis present

## 2011-08-06 DIAGNOSIS — N8111 Cystocele, midline: Secondary | ICD-10-CM

## 2011-08-06 DIAGNOSIS — D369 Benign neoplasm, unspecified site: Secondary | ICD-10-CM

## 2011-08-06 DIAGNOSIS — E876 Hypokalemia: Secondary | ICD-10-CM

## 2011-08-06 HISTORY — DX: Acute kidney failure, unspecified: N17.9

## 2011-08-06 HISTORY — DX: Chronic obstructive pulmonary disease, unspecified: J44.9

## 2011-08-06 LAB — DIFFERENTIAL
Basophils Relative: 0 % (ref 0–1)
Eosinophils Absolute: 0 10*3/uL (ref 0.0–0.7)
Monocytes Relative: 10 % (ref 3–12)
Neutro Abs: 8 10*3/uL — ABNORMAL HIGH (ref 1.7–7.7)
Neutrophils Relative %: 68 % (ref 43–77)

## 2011-08-06 LAB — COMPREHENSIVE METABOLIC PANEL
ALT: 15 U/L (ref 0–35)
AST: 36 U/L (ref 0–37)
Albumin: 3.6 g/dL (ref 3.5–5.2)
Alkaline Phosphatase: 65 U/L (ref 39–117)
BUN: 12 mg/dL (ref 6–23)
Chloride: 90 mEq/L — ABNORMAL LOW (ref 96–112)
Potassium: 2.8 mEq/L — ABNORMAL LOW (ref 3.5–5.1)
Sodium: 132 mEq/L — ABNORMAL LOW (ref 135–145)
Total Bilirubin: 0.3 mg/dL (ref 0.3–1.2)
Total Protein: 7.1 g/dL (ref 6.0–8.3)

## 2011-08-06 LAB — CARDIAC PANEL(CRET KIN+CKTOT+MB+TROPI)
Relative Index: 0.6 (ref 0.0–2.5)
Relative Index: 0.7 (ref 0.0–2.5)
Total CK: 1034 U/L — ABNORMAL HIGH (ref 7–177)
Troponin I: <0.3 ng/mL (ref <0.30)
Troponin I: <0.3 ng/mL (ref <0.30)

## 2011-08-06 LAB — URINALYSIS, ROUTINE W REFLEX MICROSCOPIC
Leukocytes, UA: NEGATIVE
Nitrite: NEGATIVE
Specific Gravity, Urine: 1.02 (ref 1.005–1.030)
Urobilinogen, UA: 0.2 mg/dL (ref 0.0–1.0)
pH: 6 (ref 5.0–8.0)

## 2011-08-06 LAB — GLUCOSE, CAPILLARY

## 2011-08-06 LAB — BLOOD GAS, ARTERIAL
Acid-Base Excess: 2.8 mmol/L — ABNORMAL HIGH (ref 0.0–2.0)
Bicarbonate: 27.7 mEq/L — ABNORMAL HIGH (ref 20.0–24.0)
O2 Saturation: 91.8 %
Patient temperature: 37
TCO2: 25 mmol/L (ref 0–100)
pH, Arterial: 7.363 (ref 7.350–7.400)

## 2011-08-06 LAB — MAGNESIUM: Magnesium: 1.6 mg/dL (ref 1.5–2.5)

## 2011-08-06 LAB — INFLUENZA PANEL BY PCR (TYPE A & B)
Influenza A By PCR: POSITIVE — AB
Influenza B By PCR: NEGATIVE

## 2011-08-06 LAB — CBC
Hemoglobin: 13 g/dL (ref 12.0–15.0)
MCH: 31 pg (ref 26.0–34.0)
MCHC: 32.9 g/dL (ref 30.0–36.0)
Platelets: 175 10*3/uL (ref 150–400)
RBC: 4.19 MIL/uL (ref 3.87–5.11)

## 2011-08-06 LAB — URINE MICROSCOPIC-ADD ON

## 2011-08-06 MED ORDER — ONDANSETRON HCL 4 MG/2ML IJ SOLN: 4.0000 mg | Freq: Four times a day (QID) | INTRAMUSCULAR | Status: DC | PRN

## 2011-08-06 MED ORDER — POTASSIUM CHLORIDE 10 MEQ/100ML IV SOLN
10.0000 meq | INTRAVENOUS | Status: AC
  Administered 2011-08-06 (×4): 10 meq via INTRAVENOUS
  Filled 2011-08-06 (×3): qty 100

## 2011-08-06 MED ORDER — GUAIFENESIN ER 600 MG PO TB12
600.0000 mg | ORAL_TABLET | Freq: Two times a day (BID) | ORAL | Status: DC
  Administered 2011-08-06 – 2011-08-08 (×4): 600 mg via ORAL
  Filled 2011-08-06 (×4): qty 1

## 2011-08-06 MED ORDER — POTASSIUM CHLORIDE 10 MEQ/100ML IV SOLN
10.0000 meq | Freq: Once | INTRAVENOUS | Status: AC
  Administered 2011-08-06: 10 meq via INTRAVENOUS
  Filled 2011-08-06: qty 100

## 2011-08-06 MED ORDER — MOXIFLOXACIN HCL IN NACL 400 MG/250ML IV SOLN
400.0000 mg | INTRAVENOUS | Status: DC
  Filled 2011-08-06 (×3): qty 250

## 2011-08-06 MED ORDER — SODIUM CHLORIDE 0.9 % IV SOLN: INTRAVENOUS | Status: DC

## 2011-08-06 MED ORDER — CLONAZEPAM 0.5 MG PO TABS
1.0000 mg | ORAL_TABLET | Freq: Two times a day (BID) | ORAL | Status: DC | PRN
Start: 2011-08-06 — End: 2011-08-08

## 2011-08-06 MED ORDER — BUDESONIDE-FORMOTEROL FUMARATE 80-4.5 MCG/ACT IN AERO
INHALATION_SPRAY | RESPIRATORY_TRACT | Status: AC
  Filled 2011-08-06: qty 6.9

## 2011-08-06 MED ORDER — FLUOXETINE HCL 20 MG PO CAPS
40.0000 mg | ORAL_CAPSULE | Freq: Every day | ORAL | Status: DC
  Administered 2011-08-07 – 2011-08-08 (×2): 40 mg via ORAL
  Filled 2011-08-06 (×2): qty 2

## 2011-08-06 MED ORDER — TRAMADOL HCL 50 MG PO TABS: 50.0000 mg | ORAL_TABLET | Freq: Four times a day (QID) | ORAL | Status: DC | PRN

## 2011-08-06 MED ORDER — PANTOPRAZOLE SODIUM 40 MG PO TBEC
40.0000 mg | DELAYED_RELEASE_TABLET | Freq: Every day | ORAL | Status: DC
  Administered 2011-08-06 – 2011-08-08 (×3): 40 mg via ORAL
  Filled 2011-08-06 (×3): qty 1

## 2011-08-06 MED ORDER — ALUM & MAG HYDROXIDE-SIMETH 200-200-20 MG/5ML PO SUSP: 30.0000 mL | Freq: Four times a day (QID) | ORAL | Status: DC | PRN

## 2011-08-06 MED ORDER — MOXIFLOXACIN HCL IN NACL 400 MG/250ML IV SOLN: 400.0000 mg | INTRAVENOUS | Status: DC

## 2011-08-06 MED ORDER — ARIPIPRAZOLE 5 MG PO TABS
5.0000 mg | ORAL_TABLET | Freq: Every day | ORAL | Status: DC
  Filled 2011-08-06 (×3): qty 1

## 2011-08-06 MED ORDER — BUTALBITAL-APAP-CAFFEINE 50-325-40 MG PO TABS: 1.0000 | ORAL_TABLET | Freq: Two times a day (BID) | ORAL | Status: DC | PRN

## 2011-08-06 MED ORDER — PREDNISONE 10 MG PO TABS
10.0000 mg | ORAL_TABLET | Freq: Every day | ORAL | Status: DC
  Administered 2011-08-07 – 2011-08-08 (×2): 10 mg via ORAL
  Filled 2011-08-06 (×2): qty 1

## 2011-08-06 MED ORDER — SENNA 8.6 MG PO TABS
1.0000 | ORAL_TABLET | Freq: Two times a day (BID) | ORAL | Status: DC
  Administered 2011-08-06 – 2011-08-08 (×4): 8.6 mg via ORAL
  Filled 2011-08-06 (×4): qty 1

## 2011-08-06 MED ORDER — POTASSIUM CHLORIDE IN NACL 40-0.9 MEQ/L-% IV SOLN
INTRAVENOUS | Status: AC
  Filled 2011-08-06: qty 1000

## 2011-08-06 MED ORDER — SODIUM CHLORIDE 0.9 % IV BOLUS (SEPSIS)
500.0000 mL | Freq: Once | INTRAVENOUS | Status: AC
  Administered 2011-08-06: 1000 mL via INTRAVENOUS

## 2011-08-06 MED ORDER — MORPHINE SULFATE 2 MG/ML IJ SOLN: 2.0000 mg | INTRAMUSCULAR | Status: DC | PRN

## 2011-08-06 MED ORDER — IPRATROPIUM BROMIDE 0.02 % IN SOLN
0.5000 mg | Freq: Once | RESPIRATORY_TRACT | Status: AC
  Administered 2011-08-06: 0.5 mg via RESPIRATORY_TRACT
  Filled 2011-08-06: qty 2.5

## 2011-08-06 MED ORDER — PREGABALIN 75 MG PO CAPS
150.0000 mg | ORAL_CAPSULE | Freq: Two times a day (BID) | ORAL | Status: DC
  Administered 2011-08-06 – 2011-08-08 (×4): 150 mg via ORAL
  Filled 2011-08-06 (×4): qty 2

## 2011-08-06 MED ORDER — ONDANSETRON HCL 4 MG PO TABS: 4.0000 mg | ORAL_TABLET | Freq: Four times a day (QID) | ORAL | Status: DC | PRN

## 2011-08-06 MED ORDER — ACETAMINOPHEN 325 MG PO TABS: 650.0000 mg | ORAL_TABLET | Freq: Four times a day (QID) | ORAL | Status: DC | PRN

## 2011-08-06 MED ORDER — BUDESONIDE-FORMOTEROL FUMARATE 80-4.5 MCG/ACT IN AERO
2.0000 | INHALATION_SPRAY | Freq: Two times a day (BID) | RESPIRATORY_TRACT | Status: DC
  Administered 2011-08-07 – 2011-08-08 (×3): 2 via RESPIRATORY_TRACT
  Filled 2011-08-06: qty 6.9

## 2011-08-06 MED ORDER — MOXIFLOXACIN HCL IN NACL 400 MG/250ML IV SOLN
400.0000 mg | Freq: Once | INTRAVENOUS | Status: AC
  Administered 2011-08-06: 400 mg via INTRAVENOUS
  Filled 2011-08-06: qty 250

## 2011-08-06 MED ORDER — LEVALBUTEROL HCL 0.63 MG/3ML IN NEBU: 0.6300 mg | INHALATION_SOLUTION | RESPIRATORY_TRACT | Status: DC | PRN

## 2011-08-06 MED ORDER — ALBUTEROL SULFATE (5 MG/ML) 0.5% IN NEBU
2.5000 mg | INHALATION_SOLUTION | Freq: Once | RESPIRATORY_TRACT | Status: AC
  Administered 2011-08-06: 2.5 mg via RESPIRATORY_TRACT
  Filled 2011-08-06: qty 0.5

## 2011-08-06 MED ORDER — POTASSIUM CHLORIDE 10 MEQ/100ML IV SOLN
INTRAVENOUS | Status: AC
  Administered 2011-08-06: 10 meq via INTRAVENOUS
  Filled 2011-08-06: qty 100

## 2011-08-06 MED ORDER — SIMVASTATIN 10 MG PO TABS
10.0000 mg | ORAL_TABLET | Freq: Every day | ORAL | Status: DC
  Administered 2011-08-06 – 2011-08-07 (×2): 10 mg via ORAL
  Filled 2011-08-06 (×2): qty 1

## 2011-08-06 MED ORDER — SODIUM CHLORIDE 0.9 % IV BOLUS (SEPSIS)
1000.0000 mL | Freq: Once | INTRAVENOUS | Status: AC
  Administered 2011-08-06: 1000 mL via INTRAVENOUS

## 2011-08-06 MED ORDER — IPRATROPIUM BROMIDE 0.02 % IN SOLN
0.5000 mg | Freq: Four times a day (QID) | RESPIRATORY_TRACT | Status: DC
  Administered 2011-08-06 – 2011-08-08 (×7): 0.5 mg via RESPIRATORY_TRACT
  Filled 2011-08-06 (×6): qty 2.5

## 2011-08-06 MED ORDER — METHYLPREDNISOLONE SODIUM SUCC 125 MG IJ SOLR
125.0000 mg | Freq: Once | INTRAMUSCULAR | Status: AC
  Administered 2011-08-06: 125 mg via INTRAVENOUS
  Filled 2011-08-06: qty 2

## 2011-08-06 MED ORDER — ACETAMINOPHEN 500 MG PO TABS
1000.0000 mg | ORAL_TABLET | Freq: Once | ORAL | Status: AC
  Administered 2011-08-06: 1000 mg via ORAL
  Filled 2011-08-06: qty 2

## 2011-08-06 MED ORDER — LEVALBUTEROL HCL 0.63 MG/3ML IN NEBU
0.6300 mg | INHALATION_SOLUTION | Freq: Four times a day (QID) | RESPIRATORY_TRACT | Status: DC
  Administered 2011-08-06 – 2011-08-08 (×7): 0.63 mg via RESPIRATORY_TRACT
  Filled 2011-08-06 (×7): qty 3

## 2011-08-06 MED ORDER — POTASSIUM CHLORIDE IN NACL 40-0.9 MEQ/L-% IV SOLN
INTRAVENOUS | Status: DC
  Administered 2011-08-06 – 2011-08-07 (×2): via INTRAVENOUS
  Filled 2011-08-06 (×11): qty 1000

## 2011-08-06 MED ORDER — ENOXAPARIN SODIUM 40 MG/0.4ML ~~LOC~~ SOLN
40.0000 mg | SUBCUTANEOUS | Status: DC
  Administered 2011-08-06 – 2011-08-07 (×2): 40 mg via SUBCUTANEOUS
  Filled 2011-08-06 (×2): qty 0.4

## 2011-08-06 MED ORDER — ACETAMINOPHEN 650 MG RE SUPP: 650.0000 mg | Freq: Four times a day (QID) | RECTAL | Status: DC | PRN

## 2011-08-06 NOTE — ED Notes (Signed)
Donato Heinz  [phone]  Husband out of town.
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
## 2011-08-06 NOTE — ED Provider Notes (Signed)
History   This chart was scribed for Sydney Lennert, MD by Sydney Blair. The patient was seen in room APA05/APA05 and the patient's care was started at 12:00pm.   CSN: [id number] Arrival date & time: 08/06/2011 11:31 AM   First MD Initiated Contact with Patient 08/06/11 1200      Chief Complaint  Patient presents with  . Dizziness    (Consider location/radiation/quality/duration/timing/severity/associated sxs/prior treatment) HPI Sydney Blair is a 55 y.o. female who presents to the Emergency Department complaining of constant moderate dizziness accompanied by confusion and off balance for the past few days. She also reports having a headache, and having a productive cough with yellow sputum. Patient denies chest pain. She reports being on 1 liter of oxygen at home, and also uses a Cpap.   PCP: Dr. Anastasia Blair  Past Medical History  Diagnosis Date  . Cancer breast  . Incontinence   . Sebaceous cyst     lt labial location  . Tobacco abuse   . GERD (gastroesophageal reflux disease)   . Depression with anxiety   . COPD (chronic obstructive pulmonary disease)     Past Surgical History  Procedure Date  . Breast lumpectomy     right with node dissection  . Esophagogastroduodenoscopy 11/11/2010    small hiatal hernia, 56 French dilator  . Colonoscopy 11/11/2010    diminutive rectal polyp otherwise normal; pathology showed  tubular adenoma  . Bladder tack   . Mastectomy     partial    Family History  Problem Relation Age of Onset  . Colon cancer Neg Hx     History  Substance Use Topics  . Smoking status: Current Everyday Smoker -- 1.0 packs/day    Types: Cigarettes  . Smokeless tobacco: Not on file   Comment: started at age 12  . Alcohol Use: No    OB History    Grav Para Term Preterm Abortions TAB SAB Ect Mult Living                  Review of Systems  Constitutional: Negative for fatigue.  HENT: Negative for congestion, sinus pressure and ear discharge.     Eyes: Negative for discharge.  Respiratory: Positive for cough (productive with yellow sputum).   Cardiovascular: Negative for chest pain.  Gastrointestinal: Negative for abdominal pain and diarrhea.  Genitourinary: Negative for frequency and hematuria.  Musculoskeletal: Negative for back pain.  Skin: Negative for rash.  Neurological: Positive for dizziness and headaches. Negative for seizures.  Hematological: Negative.   Psychiatric/Behavioral: Positive for confusion. Negative for hallucinations.    Allergies  Penicillins  Home Medications   Current Outpatient Rx  Name Route Sig Dispense Refill  . ARIPIPRAZOLE 5 MG PO TABS Oral Take 5 mg by mouth daily.     . BUDESONIDE-FORMOTEROL FUMARATE 80-4.5 MCG/ACT IN AERO Inhalation Inhale 2 puffs into the lungs 2 (two) times daily.      Marland Kitchen BUTALBITAL-APAP-CAFFEINE 50-325-40 MG PO TABS Oral Take 1 tablet by mouth 2 (two) times daily as needed.      Marland Kitchen CLONAZEPAM 2 MG PO TABS Oral Take 2 mg by mouth 2 (two) times daily as needed.      Marland Kitchen FLUOXETINE HCL 40 MG PO CAPS Oral Take 40 mg by mouth daily.      . IPRATROPIUM-ALBUTEROL 0.5-2.5 (3) MG/3ML IN SOLN Nebulization Take 3 mLs by nebulization.      Marland Kitchen OMEPRAZOLE 20 MG PO CPDR Oral Take 20 mg by mouth daily.      Marland Kitchen  PANTOPRAZOLE SODIUM 40 MG PO TBEC Oral Take 1 tablet (40 mg total) by mouth daily. Take 30 minutes before breakfast. 30 tablet 1  . PREDNISONE 10 MG PO TABS Oral Take 10 mg by mouth daily.      Marland Kitchen PREGABALIN 150 MG PO CAPS Oral Take 150 mg by mouth 2 (two) times daily.      Marland Kitchen SIMVASTATIN 10 MG PO TABS Oral Take 10 mg by mouth at bedtime.      . TRAMADOL HCL 50 MG PO TABS Oral Take 50 mg by mouth every 6 (six) hours as needed. Maximum dose= 8 tablets per day     . ALBUTEROL SULFATE HFA 108 (90 BASE) MCG/ACT IN AERS Inhalation Inhale 2 puffs into the lungs every 6 (six) hours as needed. wheezing      BP 89/48  Pulse 85  Temp(Src) 101.1 F (38.4 C) (Core (Comment))  Resp 23  Ht 5\' 6"   (1.676 m)  Wt 194 lb (87.998 kg)  BMI 31.31 kg/m2  SpO2 94%  Physical Exam  Nursing note and vitals reviewed. Constitutional: She is oriented to person, place, and time. She appears well-developed and well-nourished. No distress.  HENT:  Head: Normocephalic and atraumatic.       Mucus membranes dry   Eyes: EOM are normal. Pupils are equal, round, and reactive to light.  Neck: Neck supple. No tracheal deviation present.  Cardiovascular: Normal rate, regular rhythm and normal heart sounds.   Pulmonary/Chest: Effort normal. No respiratory distress.       Crackles at left base   Abdominal: Soft. She exhibits no distension.  Musculoskeletal: Normal range of motion. She exhibits no edema.  Neurological: She is alert and oriented to person, place, and time. No sensory deficit.  Skin: Skin is warm and dry.  Psychiatric: She has a normal mood and affect. Her behavior is normal.    ED Course  Procedures (including critical care time)  DIAGNOSTIC STUDIES: Oxygen Saturation is 94% on Leon 1 liter, adequate by my interpretation.    COORDINATION OF CARE:  1:20pm: Recheck: Her dizziness has not improved.    Labs Reviewed  CBC - Abnormal; Notable for the following:    WBC 11.8 (*)    All other components within normal limits  DIFFERENTIAL - Abnormal; Notable for the following:    Neutro Abs 8.0 (*)    Monocytes Absolute 1.2 (*)    All other components within normal limits  COMPREHENSIVE METABOLIC PANEL - Abnormal; Notable for the following:    Sodium 132 (*)    Potassium 2.8 (*)    Chloride 90 (*)    Creatinine, Ser 1.93 (*)    Calcium 8.3 (*)    GFR calc non Af Amer 29 (*)    GFR calc Af Amer 33 (*)    All other components within normal limits  BLOOD GAS, ARTERIAL - Abnormal; Notable for the following:    pCO2 arterial 50.0 (*)    pO2, Arterial 62.6 (*)    Bicarbonate 27.7 (*)    Acid-Base Excess 2.8 (*)    All other components within normal limits  CARDIAC PANEL(CRET  KIN+CKTOT+MB+TROPI) - Abnormal; Notable for the following:    Total CK 1110 (*)    CK, MB 6.3 (*)    All other components within normal limits  URINALYSIS, ROUTINE W REFLEX MICROSCOPIC - Abnormal; Notable for the following:    Hgb urine dipstick SMALL (*)    Bilirubin Urine SMALL (*)    Protein,  ur TRACE (*)    All other components within normal limits  URINE MICROSCOPIC-ADD ON - Abnormal; Notable for the following:    Squamous Epithelial / LPF FEW (*)    Casts HYALINE CASTS (*)    All other components within normal limits  LACTIC ACID, PLASMA  CULTURE, BLOOD (ROUTINE X 2)  CULTURE, BLOOD (ROUTINE X 2)  INFLUENZA PANEL BY PCR   Ct Head Wo Contrast  08/06/2011  *RADIOLOGY REPORT*  Clinical Data: Dizziness.  Breast cancer.  CT HEAD WITHOUT CONTRAST  Technique:  Contiguous axial images were obtained from the base of the skull through the vertex without contrast.  Comparison: 01/01/2004.  Findings: No intracranial hemorrhage.  No CT evidence of large acute infarct.  Small acute infarct cannot be excluded by CT.  No intracranial mass lesion detected on this unenhanced exam.  Partially empty sella incidentally noted and unchanged.  Outer cortex of the anterior right temporal region sclerotic lesion measuring up to 1.4 cm versus prior 1.3 cm.  This may represent an osteoma.  No skull base destructive lesion.  IMPRESSION: No intracranial hemorrhage or CT evidence of large acute infarct. Please see above.  Original Report Authenticated By: Fuller Canada, M.D.   Dg Chest Portable 1 View  08/06/2011  *RADIOLOGY REPORT*  Clinical Data: Shortness of breath, fever  PORTABLE CHEST - 1 VIEW  Comparison: Chest x-ray of 09/08/2010  Findings: No active infiltrate or effusion is seen.  Again there is peribronchial thickening noted which may indicate bronchitis, possibly chronic.  The heart is within normal limits in size.  No bony abnormality is seen.  IMPRESSION: No pneumonia.  Question chronic bronchitis.   Original Report Authenticated By: Juline Patch, M.D.   Results for orders placed during the hospital encounter of 08/06/11  CBC      Component Value Range   WBC 11.8 (*) 4.0 - 10.5 (K/uL)   RBC 4.19  3.87 - 5.11 (MIL/uL)   Hemoglobin 13.0  12.0 - 15.0 (g/dL)   HCT 45.4  09.8 - 11.9 (%)   MCV 94.3  78.0 - 100.0 (fL)   MCH 31.0  26.0 - 34.0 (pg)   MCHC 32.9  30.0 - 36.0 (g/dL)   RDW 14.7  82.9 - 56.2 (%)   Platelets 175  150 - 400 (K/uL)  DIFFERENTIAL      Component Value Range   Neutrophils Relative 68  43 - 77 (%)   Neutro Abs 8.0 (*) 1.7 - 7.7 (K/uL)   Lymphocytes Relative 22  12 - 46 (%)   Lymphs Abs 2.6  0.7 - 4.0 (K/uL)   Monocytes Relative 10  3 - 12 (%)   Monocytes Absolute 1.2 (*) 0.1 - 1.0 (K/uL)   Eosinophils Relative 0  0 - 5 (%)   Eosinophils Absolute 0.0  0.0 - 0.7 (K/uL)   Basophils Relative 0  0 - 1 (%)   Basophils Absolute 0.1  0.0 - 0.1 (K/uL)  COMPREHENSIVE METABOLIC PANEL      Component Value Range   Sodium 132 (*) 135 - 145 (mEq/L)   Potassium 2.8 (*) 3.5 - 5.1 (mEq/L)   Chloride 90 (*) 96 - 112 (mEq/L)   CO2 31  19 - 32 (mEq/L)   Glucose, Bld 97  70 - 99 (mg/dL)   BUN 12  6 - 23 (mg/dL)   Creatinine, Ser 1.30 (*) 0.50 - 1.10 (mg/dL)   Calcium 8.3 (*) 8.4 - 10.5 (mg/dL)   Total Protein 7.1  6.0 - 8.3 (g/dL)   Albumin 3.6  3.5 - 5.2 (g/dL)   AST 36  0 - 37 (U/L)   ALT 15  0 - 35 (U/L)   Alkaline Phosphatase 65  39 - 117 (U/L)   Total Bilirubin 0.3  0.3 - 1.2 (mg/dL)   GFR calc non Af Amer 29 (*) >90 (mL/min)   GFR calc Af Amer 33 (*) >90 (mL/min)  LACTIC ACID, PLASMA      Component Value Range   Lactic Acid, Venous 1.0  0.5 - 2.2 (mmol/L)  CULTURE, BLOOD (ROUTINE X 2)      Component Value Range   Specimen Description BLOOD BLOOD LEFT HAND     Special Requests BOTTLES DRAWN AEROBIC AND ANAEROBIC  4 CC EACH     Culture PENDING     Report Status PENDING    CULTURE, BLOOD (ROUTINE X 2)      Component Value Range   Specimen Description BLOOD BLOOD  LEFT HAND     Special Requests BOTTLES DRAWN AEROBIC ONLY  2 CC EACH     Culture PENDING     Report Status PENDING    BLOOD GAS, ARTERIAL      Component Value Range   O2 Content 1.0     Delivery systems NASAL CANNULA     pH, Arterial 7.363  7.350 - 7.400    pCO2 arterial 50.0 (*) 35.0 - 45.0 (mmHg)   pO2, Arterial 62.6 (*) 80.0 - 100.0 (mmHg)   Bicarbonate 27.7 (*) 20.0 - 24.0 (mEq/L)   TCO2 25.0  0 - 100 (mmol/L)   Acid-Base Excess 2.8 (*) 0.0 - 2.0 (mmol/L)   O2 Saturation 91.8     Patient temperature 37.0     Collection site LEFT RADIAL     Drawn by COLLECTED BY RT     Sample type ARTERIAL     Allens test (pass/fail) PASS  PASS   CARDIAC PANEL(CRET KIN+CKTOT+MB+TROPI)      Component Value Range   Total CK 1110 (*) 7 - 177 (U/L)   CK, MB 6.3 (*) 0.3 - 4.0 (ng/mL)   Troponin I <0.30  <0.30 (ng/mL)   Relative Index 0.6  0.0 - 2.5   URINALYSIS, ROUTINE W REFLEX MICROSCOPIC      Component Value Range   Color, Urine YELLOW  YELLOW    APPearance CLEAR  CLEAR    Specific Gravity, Urine 1.020  1.005 - 1.030    pH 6.0  5.0 - 8.0    Glucose, UA NEGATIVE  NEGATIVE (mg/dL)   Hgb urine dipstick SMALL (*) NEGATIVE    Bilirubin Urine SMALL (*) NEGATIVE    Ketones, ur NEGATIVE  NEGATIVE (mg/dL)   Protein, ur TRACE (*) NEGATIVE (mg/dL)   Urobilinogen, UA 0.2  0.0 - 1.0 (mg/dL)   Nitrite NEGATIVE  NEGATIVE    Leukocytes, UA NEGATIVE  NEGATIVE   URINE MICROSCOPIC-ADD ON      Component Value Range   Squamous Epithelial / LPF FEW (*) RARE    WBC, UA 3-6  <3 (WBC/hpf)   RBC / HPF 3-6  <3 (RBC/hpf)   Bacteria, UA RARE  RARE    Casts HYALINE CASTS (*) NEGATIVE    Dg Chest Portable 1 View  08/06/2011  *RADIOLOGY REPORT*  Clinical Data: Shortness of breath, fever  PORTABLE CHEST - 1 VIEW  Comparison: Chest x-ray of 09/08/2010  Findings: No active infiltrate or effusion is seen.  Again there is peribronchial thickening noted which may  indicate bronchitis, possibly chronic.  The heart is  within normal limits in size.  No bony abnormality is seen.  IMPRESSION: No pneumonia.  Question chronic bronchitis.  Original Report Authenticated By: Juline Patch, M.D.   Varney Biles Kayleen Memos W/high Density W/kub  07/27/2011  *RADIOLOGY REPORT*  Clinical Data: Dysphagia for solids and liquids for 1 month, history of upper endoscopy and esophageal dilatation 5 months ago  UPPER GI SERIES WITH AIR/HIGH DENSITY BARIUM AND KUB:  Technique: Routine air contrast upper GI series performed following ingestion of effervescent granules and high density barium.  Scout image was performed.  Fluoroscopy time:  3.9 minutes  Comparison:  None  Findings: Normal bowel gas pattern on scout image. Normal esophageal distention. No esophageal mass or stricture. 12.5 mm diameter barium tablet passes from oral cavity to stomach without delay. Targeted rapid sequence imaging of cervical esophagus and hypopharynx is normal. Mild diffuse age-related impairment of esophageal motility identified. Small amount of gastroesophageal reflux is noted.  Stomach distends without mass or ulceration. Normal rugal fold pattern without gastric outlet obstruction. Duodenal bulb and sweep normal appearance. Visualized jejunal loops unremarkable.  IMPRESSION: Minimal esophageal dysmotility compatible with age. Mild gastroesophageal reflux. No other esophageal or upper gastrointestinal abnormalities identified.  Original Report Authenticated By: Lollie Marrow, M.D.      1. Fever   2. Dehydration     Date: 08/06/2011  [id number]  Rhythm: normal sinus rhythm  QRS Axis: normal  Intervals: normal  ST/T Wave abnormalities: ST depressions laterally  Conduction Disutrbances:none  Narrative Interpretation:   Old EKG Reviewed: changes noted  CRITICAL CARE Performed by: Evyn Putzier L   Total critical care time: 45  Critical care time was exclusive of separately billable procedures and treating other patients.  Critical care was necessary to treat or  prevent imminent or life-threatening deterioration.  Critical care was time spent personally by me on the following activities: development of treatment plan with patient and/or surrogate as well as nursing, discussions with consultants, evaluation of patient's response to treatment, examination of patient, obtaining history from patient or surrogate, ordering and performing treatments and interventions, ordering and review of laboratory studies, ordering and review of radiographic studies, pulse oximetry and re-evaluation of patient's condition.    MDM  Fever,  Influenza,  Pneumonia,    The chart was scribed for me under my direct supervision.  I personally performed the history, physical, and medical decision making and all procedures in the evaluation of this patient.Sydney Lennert, MD 08/06/11 [phone]

## 2011-08-06 NOTE — Plan of Care (Signed)
Problem: Consults Goal: Respiratory Problems Patient Education See Patient Education Module for education specifics. Outcome: Progressing Reduce sob w/ activity, maintain O2 sats greater than 92%  Problem: Phase II Progression Outcomes Goal: Discharge plan remains appropriate-arrangements made Outcome: Completed/Met Date Met:  08/06/11 Plan to d/c home when stable

## 2011-08-06 NOTE — ED Notes (Signed)
Critical value CKMB called by Virl Diamond in lab 6.3  Dr Estell Harpin notified.

## 2011-08-07 ENCOUNTER — Encounter (HOSPITAL_COMMUNITY): Payer: Self-pay | Admitting: Internal Medicine

## 2011-08-07 DIAGNOSIS — J441 Chronic obstructive pulmonary disease with (acute) exacerbation: Secondary | ICD-10-CM | POA: Diagnosis present

## 2011-08-07 DIAGNOSIS — M609 Myositis, unspecified: Secondary | ICD-10-CM | POA: Diagnosis present

## 2011-08-07 DIAGNOSIS — N179 Acute kidney failure, unspecified: Secondary | ICD-10-CM

## 2011-08-07 DIAGNOSIS — E860 Dehydration: Secondary | ICD-10-CM | POA: Diagnosis present

## 2011-08-07 DIAGNOSIS — E876 Hypokalemia: Secondary | ICD-10-CM | POA: Diagnosis present

## 2011-08-07 DIAGNOSIS — J101 Influenza due to other identified influenza virus with other respiratory manifestations: Secondary | ICD-10-CM | POA: Diagnosis present

## 2011-08-07 HISTORY — DX: Acute kidney failure, unspecified: N17.9

## 2011-08-07 LAB — BASIC METABOLIC PANEL
BUN: 9 mg/dL (ref 6–23)
Calcium: 7.3 mg/dL — ABNORMAL LOW (ref 8.4–10.5)
Chloride: 107 mEq/L (ref 96–112)
Creatinine, Ser: 0.83 mg/dL (ref 0.50–1.10)
GFR calc Af Amer: >90 mL/min (ref >90)
GFR calc non Af Amer: 79 mL/min — ABNORMAL LOW (ref >90)

## 2011-08-07 LAB — CARDIAC PANEL(CRET KIN+CKTOT+MB+TROPI): Relative Index: 0.8 (ref 0.0–2.5)

## 2011-08-07 MED ORDER — IPRATROPIUM BROMIDE 0.02 % IN SOLN
RESPIRATORY_TRACT | Status: AC
  Filled 2011-08-07: qty 2.5

## 2011-08-07 MED ORDER — ALBUTEROL SULFATE (5 MG/ML) 0.5% IN NEBU
INHALATION_SOLUTION | RESPIRATORY_TRACT | Status: AC
  Filled 2011-08-07: qty 0.5

## 2011-08-07 MED ORDER — ARIPIPRAZOLE 10 MG PO TABS
5.0000 mg | ORAL_TABLET | Freq: Every day | ORAL | Status: DC
  Filled 2011-08-07 (×3): qty 1

## 2011-08-07 MED ORDER — ARIPIPRAZOLE 10 MG PO TABS
5.0000 mg | ORAL_TABLET | Freq: Every day | ORAL | Status: DC
  Administered 2011-08-07 – 2011-08-08 (×2): 5 mg via ORAL
  Filled 2011-08-07 (×3): qty 1

## 2011-08-07 MED ORDER — OSELTAMIVIR PHOSPHATE 75 MG PO CAPS
75.0000 mg | ORAL_CAPSULE | Freq: Two times a day (BID) | ORAL | Status: DC
  Administered 2011-08-07 – 2011-08-08 (×3): 75 mg via ORAL
  Filled 2011-08-07 (×3): qty 1

## 2011-08-07 MED ORDER — POTASSIUM CHLORIDE IN NACL 40-0.9 MEQ/L-% IV SOLN
INTRAVENOUS | Status: AC
  Filled 2011-08-07: qty 1000

## 2011-08-07 NOTE — Progress Notes (Signed)
Patient was placed on BIPAP following patient being found with saturations of 26% on room air and patient snoring loudly. Patient wasn't very alert following treatment. Patient did arouse later to answer a few questions that included that she wore a CPAP at home along with O 2. Patient was placed on BIPAP 12/6 with 2 lpm bled into unit. Patient is now arousal and saturations or up to 98%.

## 2011-08-07 NOTE — Progress Notes (Signed)
Subjective: Feels better. Breathing easier. Cough improved. Tolerating a diet without nausea or vomiting. Strength is better. More comfortable. Asking about blurry vision for the past year.  Objective: Vital signs in last 24 hours: Filed Vitals:   08/06/11 2231 08/07/11 0409 08/07/11 0459 08/07/11 0817  BP: 102/67  102/64   Pulse: 77  66   Temp: 98 F (36.7 C)  97.4 F (36.3 C)   TempSrc: Axillary  Axillary   Resp: 24  24   Height:      Weight:      SpO2: 97% 98% 98% 98%   Weight change:   Intake/Output Summary (Last 24 hours) at 08/07/11 1043 Last data filed at 08/07/11 0900  Gross per 24 hour  Intake   2120 ml  Output   1425 ml  Net    695 ml   Physical Exam: General: Much more comfortable today. HEENT: Left pupil larger than the right, reactive. Funduscopic exam without any significant abnormalities noted. Moist mucous membranes Lungs coarse throughout but less wheeze and less labored Cardiovascular regular rate rhythm without murmurs gallops rubs Abdomen soft nontender nondistended Extremities no clubbing cyanosis or edema   Lab Results: Basic Metabolic Panel:  Lab 08/07/11 6578 08/06/11 1805 08/06/11 1213  NA 139 -- 132*  K 4.8 -- 2.8*  CL 107 -- 90*  CO2 27 -- 31  GLUCOSE 96 -- 97  BUN 9 -- 12  CREATININE 0.83 -- 1.93*  CALCIUM 7.3* -- 8.3*  MG -- 1.6 --  PHOS -- -- --   Liver Function Tests:  Lab 08/06/11 1213  AST 36  ALT 15  ALKPHOS 65  BILITOT 0.3  PROT 7.1  ALBUMIN 3.6   No results found for this basename: LIPASE:2,AMYLASE:2 in the last 168 hours No results found for this basename: AMMONIA:2 in the last 168 hours CBC:  Lab 08/06/11 1213  WBC 11.8*  NEUTROABS 8.0*  HGB 13.0  HCT 39.5  MCV 94.3  PLT 175   Cardiac Enzymes:  Lab 08/07/11 0009 08/06/11 1828 08/06/11 1213  CKTOTAL 951* 1034* 1110*  CKMB 7.5* 6.8* 6.3*  CKMBINDEX -- -- --  TROPONINI <0.30 <0.30 <0.30   BNP: No components found with this basename:  POCBNP:3 D-Dimer: No results found for this basename: DDIMER:2 in the last 168 hours CBG:  Lab 08/06/11 2227  GLUCAP 312*   Hemoglobin A1C: No results found for this basename: HGBA1C in the last 168 hours Fasting Lipid Panel: No results found for this basename: CHOL,HDL,LDLCALC,TRIG,CHOLHDL,LDLDIRECT in the last 469 hours  Micro Results: Recent Results (from the past 240 hour(s))  CULTURE, BLOOD (ROUTINE X 2)     Status: Normal (Preliminary result)   Collection Time   08/06/11 12:30 PM      Component Value Range Status Comment   Specimen Description BLOOD BLOOD LEFT HAND   Final    Special Requests BOTTLES DRAWN AEROBIC AND ANAEROBIC  4 CC EACH   Final    Culture NO GROWTH 1 DAY   Final    Report Status PENDING   Incomplete   CULTURE, BLOOD (ROUTINE X 2)     Status: Normal (Preliminary result)   Collection Time   08/06/11  1:10 PM      Component Value Range Status Comment   Specimen Description BLOOD BLOOD LEFT HAND   Final    Special Requests BOTTLES DRAWN AEROBIC ONLY  2 CC EACH   Final    Culture NO GROWTH 1 DAY   Final  Report Status PENDING   Incomplete    Studies/Results: Ct Head Wo Contrast  08/06/2011  *RADIOLOGY REPORT*  Clinical Data: Dizziness.  Breast cancer.  CT HEAD WITHOUT CONTRAST  Technique:  Contiguous axial images were obtained from the base of the skull through the vertex without contrast.  Comparison: 01/01/2004.  Findings: No intracranial hemorrhage.  No CT evidence of large acute infarct.  Small acute infarct cannot be excluded by CT.  No intracranial mass lesion detected on this unenhanced exam.  Partially empty sella incidentally noted and unchanged.  Outer cortex of the anterior right temporal region sclerotic lesion measuring up to 1.4 cm versus prior 1.3 cm.  This may represent an osteoma.  No skull base destructive lesion.  IMPRESSION: No intracranial hemorrhage or CT evidence of large acute infarct. Please see above.  Original Report Authenticated  By: Fuller Canada, M.D.   Dg Chest Portable 1 View  08/06/2011  *RADIOLOGY REPORT*  Clinical Data: Shortness of breath, fever  PORTABLE CHEST - 1 VIEW  Comparison: Chest x-ray of 09/08/2010  Findings: No active infiltrate or effusion is seen.  Again there is peribronchial thickening noted which may indicate bronchitis, possibly chronic.  The heart is within normal limits in size.  No bony abnormality is seen.  IMPRESSION: No pneumonia.  Question chronic bronchitis.  Original Report Authenticated By: Juline Patch, M.D.   Scheduled Meds:   . acetaminophen  1,000 mg Oral Once  . albuterol  2.5 mg Nebulization Once  . ARIPiprazole  5 mg Oral Daily  . budesonide-formoterol  2 puff Inhalation BID  . enoxaparin  40 mg Subcutaneous Q24H  . FLUoxetine  40 mg Oral Daily  . guaiFENesin  600 mg Oral BID  . ipratropium  0.5 mg Nebulization Once  . ipratropium  0.5 mg Nebulization Q6H  . levalbuterol  0.63 mg Nebulization Q6H  . methylPREDNISolone (SOLU-MEDROL) injection  125 mg Intravenous Once  . moxifloxacin  400 mg Intravenous Once  . oseltamivir  75 mg Oral BID  . pantoprazole  40 mg Oral Q1200  . potassium chloride  10 mEq Intravenous Once  . potassium chloride  10 mEq Intravenous Q1 Hr x 4  . predniSONE  10 mg Oral Q breakfast  . pregabalin  150 mg Oral BID  . senna  1 tablet Oral BID  . simvastatin  10 mg Oral QHS  . sodium chloride  1,000 mL Intravenous Once  . sodium chloride  500 mL Intravenous Once  . DISCONTD: sodium chloride   Intravenous STAT  . DISCONTD: ARIPiprazole  5 mg Oral Daily  . DISCONTD: moxifloxacin  400 mg Intravenous Q24H  . DISCONTD: moxifloxacin  400 mg Intravenous Q24H   Continuous Infusions:   . DISCONTD: 0.9 % NaCl with KCl 40 mEq / L 150 mL/hr at 08/07/11 0428   PRN Meds:.acetaminophen, alum & mag hydroxide-simeth, butalbital-acetaminophen-caffeine, clonazePAM, levalbuterol, morphine, ondansetron (ZOFRAN) IV, ondansetron, traMADol, DISCONTD:  acetaminophen Assessment/Plan: Active Problems:  Influenza A  COPD exacerbation  Hypokalemia  Dehydration  ADENOCARCINOMA, BREAST, RIGHT  ANXIETY  TOBACCO ABUSE  DEPRESSION  GERD  Myositis  ARF (acute renal failure) anisocoria  Much better today. Stop IV fluids. Increase activity. Discontinue Foley catheter. Outpatient ophthalmology referral. Home tomorrow if stable.   LOS: 1 day   Onie Kasparek L 08/07/2011, 10:43 AM

## 2011-08-07 NOTE — H&P (Signed)
Hospital Admission Note Date: 08/07/2011  Patient name: Sydney Blair Medical record number: [id number] Date of birth: Apr 07, 1956 Age: 55 y.o. Gender: female PCP: Ara Kussmaul, MD  Attending physician: Christiane Ha  Chief Complaint: Fever, weakness, cough  History of Present Illness:  Sydney Blair is an 55 y.o. female smoker who presents with a several day history of worsening cough, fevers, weakness, shortness of breath and wheezing. She continues to smoke a pack of cigarettes a day. Apparently she had an episode a few days ago where she became confused and got lost in her own house. Her appetite has been poor. She's had a bit of nausea. She is coughing up yellow sputum. She's had myalgias. No sick contacts. She feels dizzy with standing. She's had no chest pain. She uses home oxygen and CPAP at night.  Past Medical History  Diagnosis Date  . Cancer breast  . Incontinence   . Sebaceous cyst     lt labial location  . Tobacco abuse   . GERD (gastroesophageal reflux disease)   . Depression with anxiety   . COPD (chronic obstructive pulmonary disease)   . ARF (acute renal failure) 08/07/2011    Meds: Prescriptions prior to admission  Medication Sig Dispense Refill  . ARIPiprazole (ABILIFY) 5 MG tablet Take 5 mg by mouth daily.       . budesonide-formoterol (SYMBICORT) 80-4.5 MCG/ACT inhaler Inhale 2 puffs into the lungs 2 (two) times daily.        . butalbital-acetaminophen-caffeine (FIORICET, ESGIC) 50-325-40 MG per tablet Take 1 tablet by mouth 2 (two) times daily as needed.        . clonazePAM (KLONOPIN) 2 MG tablet Take 2 mg by mouth 2 (two) times daily as needed.        Marland Kitchen FLUoxetine (PROZAC) 40 MG capsule Take 40 mg by mouth daily.        Marland Kitchen ipratropium-albuterol (DUONEB) 0.5-2.5 (3) MG/3ML SOLN Take 3 mLs by nebulization.        Marland Kitchen omeprazole (PRILOSEC) 20 MG capsule Take 20 mg by mouth daily.        . pantoprazole (PROTONIX) 40 MG tablet Take 1 tablet (40 mg  total) by mouth daily. Take 30 minutes before breakfast.  30 tablet  1  . predniSONE (DELTASONE) 10 MG tablet Take 10 mg by mouth daily.        . pregabalin (LYRICA) 150 MG capsule Take 150 mg by mouth 2 (two) times daily.        . simvastatin (ZOCOR) 10 MG tablet Take 10 mg by mouth at bedtime.        . traMADol (ULTRAM) 50 MG tablet Take 50 mg by mouth every 6 (six) hours as needed. Maximum dose= 8 tablets per day       . albuterol (PROVENTIL HFA;VENTOLIN HFA) 108 (90 BASE) MCG/ACT inhaler Inhale 2 puffs into the lungs every 6 (six) hours as needed. wheezing        Allergies: Penicillins History   Social History  . Marital Status: Married    Spouse Name: N/A    Number of Children: N/A  . Years of Education: N/A   Occupational History  . Not on file.   Social History Main Topics  . Smoking status: Current Everyday Smoker -- 1.0 packs/day    Types: Cigarettes  . Smokeless tobacco: Not on file   Comment: started at age 65  . Alcohol Use: No  . Drug Use: No  . Sexually Active:  Yes    Birth Control/ Protection: Post-menopausal     chemo stopped her periods   Other Topics Concern  . Not on file   Social History Narrative  . No narrative on file   Family History  Problem Relation Age of Onset  . Colon cancer Neg Hx    Past Surgical History  Procedure Date  . Breast lumpectomy     right with node dissection  . Esophagogastroduodenoscopy 11/11/2010    small hiatal hernia, 56 French dilator  . Colonoscopy 11/11/2010    diminutive rectal polyp otherwise normal; pathology showed  tubular adenoma  . Bladder tack   . Mastectomy     partial    Review of Systems: Systems reviewed and as per HPI, otherwise negative.  Physical Exam: Blood pressure 102/64, pulse 66, temperature 97.4 F (36.3 C), temperature source Axillary, resp. rate 24, height 5\' 6"  (1.676 m), weight 87.4 kg (192 lb 10.9 oz), SpO2 98.00%. BP 102/64  Pulse 66  Temp(Src) 97.4 F (36.3 C) (Axillary)  Resp  24  Ht 5\' 6"  (1.676 m)  Wt 87.4 kg (192 lb 10.9 oz)  BMI 31.10 kg/m2  SpO2 98%  General Appearance:    ill-appearing. Cooperative and appropriate.   Head:    Normocephalic, without obvious abnormality, atraumatic  Eyes:    PERRL, conjunctiva/corneas clear, EOM's intact, fundi    benign, both eyes  Ears:    Normal TM's and external ear canals, both ears  Nose:   Nares normal, septum midline, mucosa normal, no drainage    or sinus tenderness  Throat:   very dry mucous membranes   Neck:   Supple, symmetrical, trachea midline, no adenopathy;    thyroid:  no enlargement/tenderness/nodules; no carotid   bruit or JVD  Back:     Symmetric, no curvature, ROM normal, no CVA tenderness  Lungs:     rhonchi and wheeze throughout. Breathing is nonlabored.       Heart:    Regular rate and rhythm, S1 and S2 normal, no murmur, rub   or gallop     Abdomen:     Soft, non-tender, bowel sounds active all four quadrants,    no masses, no organomegaly  Genitalia:   Foley catheter draining clear urine   Rectal:   deferred   Extremities:   Extremities normal, atraumatic, no cyanosis or edema  Pulses:   2+ and symmetric all extremities  Skin:   Skin color, texture, turgor normal, no rashes or lesions  Lymph nodes:   Cervical, supraclavicular, and axillary nodes normal  Neurologic:   CNII-XII intact, normal strength, sensation and reflexes    throughout    Psychiatric: Normal affect. Cooperative.  Lab results: Basic Metabolic Panel:  Basename 08/06/11 1805 08/06/11 1213  NA -- 132*  K -- 2.8*  CL -- 90*  CO2 -- 31  GLUCOSE -- 97  BUN -- 12  CREATININE -- 1.93*  CALCIUM -- 8.3*  MG 1.6 --  PHOS -- --   Liver Function Tests:  Basename 08/06/11 1213  AST 36  ALT 15  ALKPHOS 65  BILITOT 0.3  PROT 7.1  ALBUMIN 3.6   No results found for this basename: LIPASE:2,AMYLASE:2 in the last 72 hours No results found for this basename: AMMONIA:2 in the last 72 hours CBC:  Basename 08/06/11 1213    WBC 11.8*  NEUTROABS 8.0*  HGB 13.0  HCT 39.5  MCV 94.3  PLT 175   Cardiac Enzymes:  Basename 08/07/11 0009 08/06/11  1828 08/06/11 1213  CKTOTAL 951* 1034* 1110*  CKMB 7.5* 6.8* 6.3*  CKMBINDEX -- -- --  TROPONINI <0.30 <0.30 <0.30   BNP: No components found with this basename: POCBNP:3 D-Dimer: No results found for this basename: DDIMER:2 in the last 72 hours CBG:  Basename 08/06/11 2227  GLUCAP 312*   Hemoglobin A1C: No results found for this basename: HGBA1C in the last 72 hours Fasting Lipid Panel: No results found for this basename: CHOL,HDL,LDLCALC,TRIG,CHOLHDL,LDLDIRECT in the last 72 hours Thyroid Function Tests: No results found for this basename: TSH,T4TOTAL,FREET4,T3FREE,THYROIDAB in the last 72 hours Anemia Panel: No results found for this basename: VITAMINB12,FOLATE,FERRITIN,TIBC,IRON,RETICCTPCT in the last 72 hours Coagulation: No results found for this basename: LABPROT:2,INR:2 in the last 72 hours Urine Drug Screen: Drugs of Abuse  No results found for this basename: labopia, cocainscrnur, labbenz, amphetmu, thcu, labbarb    Alcohol Level: No results found for this basename: ETH:2 in the last 72 hours Urinalysis: Results for FONDA, ROCHON (MRN [id number]) as of 08/07/2011 08:26  Ref. Range 08/06/2011 14:08  Color, Urine Latest Range: YELLOW  YELLOW  APPearance Latest Range: CLEAR  CLEAR  Specific Gravity, Urine Latest Range: 1.005-1.030  1.020  pH Latest Range: 5.0-8.0  6.0  Glucose, UA Latest Range: NEGATIVE mg/dL NEGATIVE  Bilirubin Urine Latest Range: NEGATIVE  SMALL (A)  Ketones, ur Latest Range: NEGATIVE mg/dL NEGATIVE  Protein Latest Range: NEGATIVE mg/dL TRACE (A)  Urobilinogen, UA Latest Range: 0.0-1.0 mg/dL 0.2  Nitrite Latest Range: NEGATIVE  NEGATIVE  Leukocytes, UA Latest Range: NEGATIVE  NEGATIVE  WBC, UA Latest Range: <3 WBC/hpf 3-6  RBC / HPF Latest Range: <3 RBC/hpf 3-6  Squamous Epithelial / LPF Latest Range: RARE  FEW  (A)  Bacteria, UA Latest Range: RARE  RARE  Casts Latest Range: NEGATIVE  HYALINE CASTS (A)    Imaging results:  Ct Head Wo Contrast  08/06/2011  *RADIOLOGY REPORT*  Clinical Data: Dizziness.  Breast cancer.  CT HEAD WITHOUT CONTRAST  Technique:  Contiguous axial images were obtained from the base of the skull through the vertex without contrast.  Comparison: 01/01/2004.  Findings: No intracranial hemorrhage.  No CT evidence of large acute infarct.  Small acute infarct cannot be excluded by CT.  No intracranial mass lesion detected on this unenhanced exam.  Partially empty sella incidentally noted and unchanged.  Outer cortex of the anterior right temporal region sclerotic lesion measuring up to 1.4 cm versus prior 1.3 cm.  This may represent an osteoma.  No skull base destructive lesion.  IMPRESSION: No intracranial hemorrhage or CT evidence of large acute infarct. Please see above.  Original Report Authenticated By: Fuller Canada, M.D.   Dg Chest Portable 1 View  08/06/2011  *RADIOLOGY REPORT*  Clinical Data: Shortness of breath, fever  PORTABLE CHEST - 1 VIEW  Comparison: Chest x-ray of 09/08/2010  Findings: No active infiltrate or effusion is seen.  Again there is peribronchial thickening noted which may indicate bronchitis, possibly chronic.  The heart is within normal limits in size.  No bony abnormality is seen.  IMPRESSION: No pneumonia.  Question chronic bronchitis.  Original Report Authenticated By: Juline Patch, M.D.    Assessment & Plan: Active Problems:  Influenza A  COPD exacerbation  Hypokalemia  Dehydration  ADENOCARCINOMA, BREAST, RIGHT  ANXIETY  TOBACCO ABUSE  DEPRESSION  GERD  Myositis  ARF (acute renal failure)  Patient will be hydrated, start Tamiflu. Droplet percussions. Replete potassium IV. Antipyretics and supportive care. Bronchodilators. She received  a dose of Solu-Medrol. She tells me she is on chronic prednisone 10 mg daily. I will continue her home dose  for now and reassess tomorrow. Encouraged tobacco cessation. repeat labs in the morning.  Slade Pierpoint L 08/07/2011, 8:03 AM

## 2011-08-08 MED ORDER — OSELTAMIVIR PHOSPHATE 75 MG PO CAPS: 75.0000 mg | ORAL_CAPSULE | Freq: Two times a day (BID) | ORAL | Status: AC

## 2011-08-08 MED ORDER — PREDNISONE 10 MG PO TABS: ORAL_TABLET | ORAL | Status: DC

## 2011-08-08 NOTE — Progress Notes (Signed)
Reviewed discharge instructions and medications with client. Client discharged to home in stable condition.

## 2011-08-08 NOTE — Discharge Summary (Signed)
Physician Discharge Summary  Patient ID: Sydney Blair MRN: [id number] DOB/AGE: Jun 19, 1956 55 y.o.  Admit date: 08/06/2011 Discharge date: 08/08/2011  Discharge Diagnoses:  Active Problems:  Influenza A  COPD exacerbation  Hypokalemia  Dehydration  ADENOCARCINOMA, BREAST, RIGHT  ANXIETY  TOBACCO ABUSE  DEPRESSION  GERD  Myositis  ARF (acute renal failure)   Current Discharge Medication List    START taking these medications   Details  oseltamivir (TAMIFLU) 75 MG capsule Take 1 capsule (75 mg total) by mouth 2 (two) times daily. Qty: 7 capsule, Refills: 0      CONTINUE these medications which have CHANGED   Details  predniSONE (DELTASONE) 10 MG tablet Take 4 tablets for 2 days, then 3 tablets daily for 2 days, then 2 tablets daily for 2 days then resume one tablet daily Qty: 20 tablet, Refills: 0      CONTINUE these medications which have NOT CHANGED   Details  ARIPiprazole (ABILIFY) 5 MG tablet Take 5 mg by mouth daily.     budesonide-formoterol (SYMBICORT) 80-4.5 MCG/ACT inhaler Inhale 2 puffs into the lungs 2 (two) times daily.      butalbital-acetaminophen-caffeine (FIORICET, ESGIC) 50-325-40 MG per tablet Take 1 tablet by mouth 2 (two) times daily as needed.      clonazePAM (KLONOPIN) 2 MG tablet Take 2 mg by mouth 2 (two) times daily as needed.      FLUoxetine (PROZAC) 40 MG capsule Take 40 mg by mouth daily.      ipratropium-albuterol (DUONEB) 0.5-2.5 (3) MG/3ML SOLN Take 3 mLs by nebulization.      omeprazole (PRILOSEC) 20 MG capsule Take 20 mg by mouth daily.      pantoprazole (PROTONIX) 40 MG tablet Take 1 tablet (40 mg total) by mouth daily. Take 30 minutes before breakfast. Qty: 30 tablet, Refills: 1    pregabalin (LYRICA) 150 MG capsule Take 150 mg by mouth 2 (two) times daily.      simvastatin (ZOCOR) 10 MG tablet Take 10 mg by mouth at bedtime.      traMADol (ULTRAM) 50 MG tablet Take 50 mg by mouth every 6 (six) hours as needed. Maximum  dose= 8 tablets per day     albuterol (PROVENTIL HFA;VENTOLIN HFA) 108 (90 BASE) MCG/ACT inhaler Inhale 2 puffs into the lungs every 6 (six) hours as needed. wheezing        Discharge Orders    Future Appointments: Provider: Department: Dept Phone: Center:   11/01/2011 10:00 AM Gerrit Halls, NP [facility] [phone] [facility]     Future Orders Please Complete By Expires   Diet general      Increase activity slowly      Discharge instructions      Comments:   Drink Plenty of fluids.      Follow-up Information    Follow up with HALM,STEVEN J. (If symptoms worsen)          Disposition: Home or Self Care  Discharged Condition: stable  Consults:  none  Labs:   Results for orders placed during the hospital encounter of 08/06/11 (from the past 48 hour(s))  CBC     Status: Abnormal   Collection Time   08/06/11 12:13 PM      Component Value Range Comment   WBC 11.8 (*) 4.0 - 10.5 (K/uL)    RBC 4.19  3.87 - 5.11 (MIL/uL)    Hemoglobin 13.0  12.0 - 15.0 (g/dL)    HCT 14.7  82.9 - 56.2 (%)  MCV 94.3  78.0 - 100.0 (fL)    MCH 31.0  26.0 - 34.0 (pg)    MCHC 32.9  30.0 - 36.0 (g/dL)    RDW 16.1  09.6 - 04.5 (%)    Platelets 175  150 - 400 (K/uL)   DIFFERENTIAL     Status: Abnormal   Collection Time   08/06/11 12:13 PM      Component Value Range Comment   Neutrophils Relative 68  43 - 77 (%)    Neutro Abs 8.0 (*) 1.7 - 7.7 (K/uL)    Lymphocytes Relative 22  12 - 46 (%)    Lymphs Abs 2.6  0.7 - 4.0 (K/uL)    Monocytes Relative 10  3 - 12 (%)    Monocytes Absolute 1.2 (*) 0.1 - 1.0 (K/uL)    Eosinophils Relative 0  0 - 5 (%)    Eosinophils Absolute 0.0  0.0 - 0.7 (K/uL)    Basophils Relative 0  0 - 1 (%)    Basophils Absolute 0.1  0.0 - 0.1 (K/uL)   COMPREHENSIVE METABOLIC PANEL     Status: Abnormal   Collection Time   08/06/11 12:13 PM      Component Value Range Comment   Sodium 132 (*) 135 - 145 (mEq/L)    Potassium 2.8 (*) 3.5 - 5.1 (mEq/L)    Chloride 90 (*) 96 -  112 (mEq/L)    CO2 31  19 - 32 (mEq/L)    Glucose, Bld 97  70 - 99 (mg/dL)    BUN 12  6 - 23 (mg/dL)    Creatinine, Ser 4.09 (*) 0.50 - 1.10 (mg/dL)    Calcium 8.3 (*) 8.4 - 10.5 (mg/dL)    Total Protein 7.1  6.0 - 8.3 (g/dL)    Albumin 3.6  3.5 - 5.2 (g/dL)    AST 36  0 - 37 (U/L)    ALT 15  0 - 35 (U/L)    Alkaline Phosphatase 65  39 - 117 (U/L)    Total Bilirubin 0.3  0.3 - 1.2 (mg/dL)    GFR calc non Af Amer 29 (*) >90 (mL/min)    GFR calc Af Amer 33 (*) >90 (mL/min)   CARDIAC PANEL(CRET KIN+CKTOT+MB+TROPI)     Status: Abnormal   Collection Time   08/06/11 12:13 PM      Component Value Range Comment   Total CK 1110 (*) 7 - 177 (U/L)    CK, MB 6.3 (*) 0.3 - 4.0 (ng/mL)    Troponin I <0.30  <0.30 (ng/mL)    Relative Index 0.6  0.0 - 2.5    LACTIC ACID, PLASMA     Status: Normal   Collection Time   08/06/11 12:30 PM      Component Value Range Comment   Lactic Acid, Venous 1.0  0.5 - 2.2 (mmol/L)   CULTURE, BLOOD (ROUTINE X 2)     Status: Normal (Preliminary result)   Collection Time   08/06/11 12:30 PM      Component Value Range Comment   Specimen Description BLOOD BLOOD LEFT HAND      Special Requests BOTTLES DRAWN AEROBIC AND ANAEROBIC  4 CC EACH      Culture NO GROWTH 2 DAYS      Report Status PENDING     BLOOD GAS, ARTERIAL     Status: Abnormal   Collection Time   08/06/11 12:47 PM      Component Value Range Comment   O2 Content  1.0      Delivery systems NASAL CANNULA      pH, Arterial 7.363  7.350 - 7.400     pCO2 arterial 50.0 (*) 35.0 - 45.0 (mmHg)    pO2, Arterial 62.6 (*) 80.0 - 100.0 (mmHg)    Bicarbonate 27.7 (*) 20.0 - 24.0 (mEq/L)    TCO2 25.0  0 - 100 (mmol/L)    Acid-Base Excess 2.8 (*) 0.0 - 2.0 (mmol/L)    O2 Saturation 91.8      Patient temperature 37.0      Collection site LEFT RADIAL      Drawn by COLLECTED BY RT      Sample type ARTERIAL      Allens test (pass/fail) PASS  PASS    CULTURE, BLOOD (ROUTINE X 2)     Status: Normal (Preliminary  result)   Collection Time   08/06/11  1:10 PM      Component Value Range Comment   Specimen Description BLOOD BLOOD LEFT HAND      Special Requests BOTTLES DRAWN AEROBIC ONLY  2 CC EACH      Culture NO GROWTH 2 DAYS      Report Status PENDING     URINALYSIS, ROUTINE W REFLEX MICROSCOPIC     Status: Abnormal   Collection Time   08/06/11  2:08 PM      Component Value Range Comment   Color, Urine YELLOW  YELLOW     APPearance CLEAR  CLEAR     Specific Gravity, Urine 1.020  1.005 - 1.030     pH 6.0  5.0 - 8.0     Glucose, UA NEGATIVE  NEGATIVE (mg/dL)    Hgb urine dipstick SMALL (*) NEGATIVE     Bilirubin Urine SMALL (*) NEGATIVE     Ketones, ur NEGATIVE  NEGATIVE (mg/dL)    Protein, ur TRACE (*) NEGATIVE (mg/dL)    Urobilinogen, UA 0.2  0.0 - 1.0 (mg/dL)    Nitrite NEGATIVE  NEGATIVE     Leukocytes, UA NEGATIVE  NEGATIVE    URINE MICROSCOPIC-ADD ON     Status: Abnormal   Collection Time   08/06/11  2:08 PM      Component Value Range Comment   Squamous Epithelial / LPF FEW (*) RARE     WBC, UA 3-6  <3 (WBC/hpf)    RBC / HPF 3-6  <3 (RBC/hpf)    Bacteria, UA RARE  RARE     Casts HYALINE CASTS (*) NEGATIVE    INFLUENZA PANEL BY PCR     Status: Abnormal   Collection Time   08/06/11  4:15 PM      Component Value Range Comment   Influenza A By PCR POSITIVE (*) NEGATIVE     Influenza B By PCR NEGATIVE  NEGATIVE     H1N1 flu by pcr NOT DETECTED  NOT DETECTED    MAGNESIUM     Status: Normal   Collection Time   08/06/11  6:05 PM      Component Value Range Comment   Magnesium 1.6  1.5 - 2.5 (mg/dL)   CARDIAC PANEL(CRET KIN+CKTOT+MB+TROPI)     Status: Abnormal   Collection Time   08/06/11  6:28 PM      Component Value Range Comment   Total CK 1034 (*) 7 - 177 (U/L)    CK, MB 6.8 (*) 0.3 - 4.0 (ng/mL) CRITICAL VALUE NOTED.  VALUE IS CONSISTENT WITH PREVIOUSLY REPORTED AND CALLED VALUE.   Troponin I <0.30  <  0.30 (ng/mL)    Relative Index 0.7  0.0 - 2.5    GLUCOSE, CAPILLARY      Status: Abnormal   Collection Time   08/06/11 10:27 PM      Component Value Range Comment   Glucose-Capillary 312 (*) 70 - 99 (mg/dL)    Comment 1 Notify RN      Comment 2 Documented in Chart     CARDIAC PANEL(CRET KIN+CKTOT+MB+TROPI)     Status: Abnormal   Collection Time   08/07/11 12:09 AM      Component Value Range Comment   Total CK 951 (*) 7 - 177 (U/L)    CK, MB 7.5 (*) 0.3 - 4.0 (ng/mL) CRITICAL VALUE NOTED.  VALUE IS CONSISTENT WITH PREVIOUSLY REPORTED AND CALLED VALUE.   Troponin I <0.30  <0.30 (ng/mL)    Relative Index 0.8  0.0 - 2.5    BASIC METABOLIC PANEL     Status: Abnormal   Collection Time   08/07/11  8:17 AM      Component Value Range Comment   Sodium 139  135 - 145 (mEq/L)    Potassium 4.8  3.5 - 5.1 (mEq/L)    Chloride 107  96 - 112 (mEq/L)    CO2 27  19 - 32 (mEq/L)    Glucose, Bld 96  70 - 99 (mg/dL)    BUN 9  6 - 23 (mg/dL)    Creatinine, Ser 4.09  0.50 - 1.10 (mg/dL)    Calcium 7.3 (*) 8.4 - 10.5 (mg/dL)    GFR calc non Af Amer 79 (*) >90 (mL/min)    GFR calc Af Amer >90  >90 (mL/min)     Diagnostics:  Ct Head Wo Contrast  08/06/2011  *RADIOLOGY REPORT*  Clinical Data: Dizziness.  Breast cancer.  CT HEAD WITHOUT CONTRAST  Technique:  Contiguous axial images were obtained from the base of the skull through the vertex without contrast.  Comparison: 01/01/2004.  Findings: No intracranial hemorrhage.  No CT evidence of large acute infarct.  Small acute infarct cannot be excluded by CT.  No intracranial mass lesion detected on this unenhanced exam.  Partially empty sella incidentally noted and unchanged.  Outer cortex of the anterior right temporal region sclerotic lesion measuring up to 1.4 cm versus prior 1.3 cm.  This may represent an osteoma.  No skull base destructive lesion.  IMPRESSION: No intracranial hemorrhage or CT evidence of large acute infarct. Please see above.  Original Report Authenticated By: Fuller Canada, M.D.   Dg Chest Portable 1  View  08/06/2011  *RADIOLOGY REPORT*  Clinical Data: Shortness of breath, fever  PORTABLE CHEST - 1 VIEW  Comparison: Chest x-ray of 09/08/2010  Findings: No active infiltrate or effusion is seen.  Again there is peribronchial thickening noted which may indicate bronchitis, possibly chronic.  The heart is within normal limits in size.  No bony abnormality is seen.  IMPRESSION: No pneumonia.  Question chronic bronchitis.  Original Report Authenticated By: Juline Patch, M.D.   Varney Biles Kayleen Memos W/high Density W/kub  07/27/2011  *RADIOLOGY REPORT*  Clinical Data: Dysphagia for solids and liquids for 1 month, history of upper endoscopy and esophageal dilatation 5 months ago  UPPER GI SERIES WITH AIR/HIGH DENSITY BARIUM AND KUB:  Technique: Routine air contrast upper GI series performed following ingestion of effervescent granules and high density barium.  Scout image was performed.  Fluoroscopy time:  3.9 minutes  Comparison:  None  Findings: Normal bowel gas pattern on  scout image. Normal esophageal distention. No esophageal mass or stricture. 12.5 mm diameter barium tablet passes from oral cavity to stomach without delay. Targeted rapid sequence imaging of cervical esophagus and hypopharynx is normal. Mild diffuse age-related impairment of esophageal motility identified. Small amount of gastroesophageal reflux is noted.  Stomach distends without mass or ulceration. Normal rugal fold pattern without gastric outlet obstruction. Duodenal bulb and sweep normal appearance. Visualized jejunal loops unremarkable.  IMPRESSION: Minimal esophageal dysmotility compatible with age. Mild gastroesophageal reflux. No other esophageal or upper gastrointestinal abnormalities identified.  Original Report Authenticated By: Lollie Marrow, M.D.    Procedures:  EKG: Normal sinus rhythm Low voltage QRS Septal infarct , age undetermined Abnormal ECG When compared with ECG of 06-Aug-2011 12:31, QRS voltage has decreased Septal infarct  is now Present Non-specific change in ST segment in Lateral leads T wave inversion no longer evident in Lateral leads  Full Code   Hospital Course: See H&P for complete admission details. The patient is a pleasant 55 year old white female smoker who presented with weakness, shortness of breath, cough, fevers chills. Her temperature in the emergency room was 1-2.6. She appeared acutely ill with extremely dry mucous membranes. Coarse wheeze and rhonchi on lung exam. She was uncomfortable and tachypneic. Her potassium was low and her creatinine was higher than usual. She had been given a dose of Avelox as well as steroids bronchodilators oxygen therapy in the emergency room. Her flu swab was positive for influenza A. The Avelox was stopped and she was started on Tamiflu. She is on chronic prednisone and her steroids were increased while here. She was given IV hydration, potassium repletion, supportive care. Her bronchodilators were continued. She is on chronic oxygen and CPAP at night. We talked about quitting smoking and I've encouraged her to again attempt. By the time of discharge, she was ambulating, afebrile, lungs were more clear, no longer dehydrated and feeling much better. She is stable for discharge.  Discharge Exam:  Blood pressure 123/84, pulse 98, temperature 99.6 F (37.6 C), temperature source Oral, resp. rate 16, height 5\' 6"  (1.676 m), weight 87.8 kg (193 lb 9 oz), SpO2 82.00%.  Comfortable Lungs less wheeze,  Better air movement   Signed: [place] Brogden L 08/08/2011, 9:55 AM

## 2011-08-11 LAB — CULTURE, BLOOD (ROUTINE X 2)
Culture: NO GROWTH
Culture: NO GROWTH

## 2011-09-23 ENCOUNTER — Other Ambulatory Visit: Payer: Self-pay

## 2011-09-24 MED ORDER — PANTOPRAZOLE SODIUM 40 MG PO TBEC: 40.0000 mg | DELAYED_RELEASE_TABLET | Freq: Every day | ORAL | Status: DC

## 2011-11-01 ENCOUNTER — Telehealth: Payer: Self-pay | Admitting: Gastroenterology

## 2011-11-01 ENCOUNTER — Ambulatory Visit: Payer: Medicaid Other | Admitting: Gastroenterology

## 2011-11-01 NOTE — Telephone Encounter (Signed)
Pt was a no show

## 2011-12-23 NOTE — Telephone Encounter (Signed)
First no-show. Let's offer OV for f/u of dysphagia. May send letter.

## 2011-12-29 ENCOUNTER — Encounter: Payer: Self-pay | Admitting: Internal Medicine

## 2011-12-29 NOTE — Telephone Encounter (Signed)
Letter mailed to patient to call office to set up OV °

## 2012-02-01 ENCOUNTER — Encounter (HOSPITAL_COMMUNITY): Payer: Self-pay | Admitting: *Deleted

## 2012-02-01 ENCOUNTER — Emergency Department (HOSPITAL_COMMUNITY): Payer: Medicaid Other

## 2012-02-01 ENCOUNTER — Emergency Department (HOSPITAL_COMMUNITY)
Admission: EM | Admit: 2012-02-01 | Discharge: 2012-02-01 | Disposition: A | Discharge status: Home / Self Care | Payer: Medicaid Other | Attending: Emergency Medicine | Admitting: Emergency Medicine

## 2012-02-01 DIAGNOSIS — F17200 Nicotine dependence, unspecified, uncomplicated: Secondary | ICD-10-CM | POA: Insufficient documentation

## 2012-02-01 DIAGNOSIS — M25569 Pain in unspecified knee: Secondary | ICD-10-CM | POA: Insufficient documentation

## 2012-02-01 DIAGNOSIS — F341 Dysthymic disorder: Secondary | ICD-10-CM | POA: Insufficient documentation

## 2012-02-01 DIAGNOSIS — J4489 Other specified chronic obstructive pulmonary disease: Secondary | ICD-10-CM | POA: Insufficient documentation

## 2012-02-01 DIAGNOSIS — J449 Chronic obstructive pulmonary disease, unspecified: Secondary | ICD-10-CM

## 2012-02-01 DIAGNOSIS — M549 Dorsalgia, unspecified: Secondary | ICD-10-CM

## 2012-02-01 DIAGNOSIS — M79609 Pain in unspecified limb: Secondary | ICD-10-CM | POA: Insufficient documentation

## 2012-02-01 DIAGNOSIS — M7989 Other specified soft tissue disorders: Secondary | ICD-10-CM | POA: Insufficient documentation

## 2012-02-01 DIAGNOSIS — K219 Gastro-esophageal reflux disease without esophagitis: Secondary | ICD-10-CM | POA: Insufficient documentation

## 2012-02-01 LAB — COMPREHENSIVE METABOLIC PANEL
ALT: 9 U/L (ref 0–35)
AST: 12 U/L (ref 0–37)
Albumin: 3.1 g/dL — ABNORMAL LOW (ref 3.5–5.2)
CO2: 33 mEq/L — ABNORMAL HIGH (ref 19–32)
Calcium: 9.3 mg/dL (ref 8.4–10.5)
Chloride: 96 mEq/L (ref 96–112)
Creatinine, Ser: 0.72 mg/dL (ref 0.50–1.10)
Sodium: 138 mEq/L (ref 135–145)
Total Bilirubin: 0.2 mg/dL — ABNORMAL LOW (ref 0.3–1.2)

## 2012-02-01 LAB — PRO B NATRIURETIC PEPTIDE: Pro B Natriuretic peptide (BNP): 335.7 pg/mL — ABNORMAL HIGH (ref 0–125)

## 2012-02-01 LAB — DIFFERENTIAL
Basophils Absolute: 0 10*3/uL (ref 0.0–0.1)
Basophils Relative: 0 % (ref 0–1)
Lymphocytes Relative: 18 % (ref 12–46)
Monocytes Absolute: 0.7 10*3/uL (ref 0.1–1.0)
Neutro Abs: 10.6 10*3/uL — ABNORMAL HIGH (ref 1.7–7.7)

## 2012-02-01 LAB — CBC
HCT: 39.8 % (ref 36.0–46.0)
MCHC: 31.9 g/dL (ref 30.0–36.0)
Platelets: 270 10*3/uL (ref 150–400)
RDW: 16.5 % — ABNORMAL HIGH (ref 11.5–15.5)
WBC: 14 10*3/uL — ABNORMAL HIGH (ref 4.0–10.5)

## 2012-02-01 MED ORDER — HYDROMORPHONE HCL PF 1 MG/ML IJ SOLN
1.0000 mg | Freq: Once | INTRAMUSCULAR | Status: AC
  Administered 2012-02-01: 1 mg via INTRAVENOUS
  Filled 2012-02-01: qty 1

## 2012-02-01 MED ORDER — OXYCODONE-ACETAMINOPHEN 5-325 MG PO TABS: 1.0000 | ORAL_TABLET | Freq: Four times a day (QID) | ORAL | Status: AC | PRN

## 2012-02-01 MED ORDER — ONDANSETRON HCL 4 MG/2ML IJ SOLN
4.0000 mg | Freq: Once | INTRAMUSCULAR | Status: AC
  Administered 2012-02-01: 4 mg via INTRAVENOUS
  Filled 2012-02-01: qty 2

## 2012-02-01 MED ORDER — PREDNISONE 10 MG PO TABS: 10.0000 mg | ORAL_TABLET | Freq: Every day | ORAL | Status: DC

## 2012-02-01 MED ORDER — SODIUM CHLORIDE 0.9 % IV SOLN
Freq: Once | INTRAVENOUS | Status: AC
  Administered 2012-02-01: 11:00:00 via INTRAVENOUS

## 2012-02-01 NOTE — ED Provider Notes (Addendum)
History  This chart was scribed for Sydney Lennert, MD by Bennett Scrape. This patient was seen in room APA07/[facility]07 and the patient's care was started at 10:04AM.  CSN: [id number]  Arrival date & time 02/01/12  4098   First MD Initiated Contact with Patient 02/01/12 1004      Chief Complaint  Patient presents with  . Shortness of Breath  . Back Pain  . Leg Swelling    Patient is a 56 y.o. female presenting with back pain. The history is provided by the patient. No language interpreter was used.  Back Pain  This is a recurrent problem. The current episode started more than 1 week ago. The problem occurs constantly. The problem has been gradually worsening. The pain is associated with no known injury. The pain is present in the lumbar spine. The quality of the pain is described as stabbing. The pain radiates to the left thigh, left knee and left foot. The pain is at a severity of 10/10. The symptoms are aggravated by certain positions. Pertinent negatives include no chest pain, no headaches and no abdominal pain. She has tried nothing for the symptoms.    Sydney Blair is a 56 y.o. female who presents to the Emergency Department complaining of 2 years of gradual onset, gradually worsening, constant left-sided lower back pain described as sharp that radiates shooting pains down both legs but is worse in the left leg. She states that the pain has been at its worst the past 2 to 3 months. She rates her pain a 10 out of 10 at its worst. The pain is worse with certain postions, lifting her legs, sitting and walking. She reports that she is having increased difficulty walking because of the pain. She denies taking any OTC medications to improve her pain. She denies having any known cause. She denies any recent falls or injuries that would have worsened the back painShe also states that she has constant bilateral foot numbness and bilateral finger numbness for the past 2 weeks. She states that  she almost fell several times last week because of the feet numbness but denies any injury. She states that she was following up with Dr. Gerilyn Pilgrim but has not followed up since Medicaid canceled her claim. She states that when she was following up with [place] she was getting tramadol with improvement in her pain. She denies chest pain, abdominal pain, bowel or bladder incontinence and dysuria as associated symptoms. She has a h/o CA, GERD and COPD. She states that she has been taking one prednisone a day for the past year for COPD. She also states that she has chronic diarrhea, cough, SOB, and urgency. She is a current everyday smoker but denies alcohol use.   Was seeing Dr. Stephania Fragmin. Last saw him 6 months ago.   Past Medical History  Diagnosis Date  . Cancer breast  . Incontinence   . Sebaceous cyst     lt labial location  . Tobacco abuse   . GERD (gastroesophageal reflux disease)   . Depression with anxiety   . COPD (chronic obstructive pulmonary disease)   . ARF (acute renal failure) 08/07/2011    Past Surgical History  Procedure Date  . Breast lumpectomy     right with node dissection  . Esophagogastroduodenoscopy 11/11/2010    small hiatal hernia, 56 French dilator  . Colonoscopy 11/11/2010    diminutive rectal polyp otherwise normal; pathology showed  tubular adenoma  . Bladder tack   . Mastectomy  partial    Family History  Problem Relation Age of Onset  . Colon cancer Neg Hx     History  Substance Use Topics  . Smoking status: Current Everyday Smoker -- 1.0 packs/day    Types: Cigarettes  . Smokeless tobacco: Not on file   Comment: started at age 63  . Alcohol Use: No    Review of Systems  Constitutional: Negative for fatigue.  HENT: Negative for congestion, sinus pressure and ear discharge.   Eyes: Negative for discharge.  Respiratory: Negative for cough.   Cardiovascular: Negative for chest pain.  Gastrointestinal: Negative for abdominal pain and diarrhea.    Genitourinary: Negative for frequency and hematuria.  Musculoskeletal: Positive for back pain (radiates down both legs).  Skin: Negative for rash.  Neurological: Negative for seizures and headaches.  Hematological: Negative.   Psychiatric/Behavioral: Negative for hallucinations.    Allergies  Penicillins  Home Medications   Current Outpatient Rx  Name Route Sig Dispense Refill  . ALBUTEROL SULFATE HFA 108 (90 BASE) MCG/ACT IN AERS Inhalation Inhale 2 puffs into the lungs every 6 (six) hours as needed. wheezing    . ARIPIPRAZOLE 5 MG PO TABS Oral Take 5 mg by mouth daily.     . BUDESONIDE-FORMOTEROL FUMARATE 80-4.5 MCG/ACT IN AERO Inhalation Inhale 2 puffs into the lungs 2 (two) times daily.      Marland Kitchen BUTALBITAL-APAP-CAFFEINE 50-325-40 MG PO TABS Oral Take 1 tablet by mouth 2 (two) times daily as needed.      Marland Kitchen CLONAZEPAM 2 MG PO TABS Oral Take 2 mg by mouth 2 (two) times daily as needed.      Marland Kitchen FLUOXETINE HCL 40 MG PO CAPS Oral Take 40 mg by mouth daily.      . IPRATROPIUM-ALBUTEROL 0.5-2.5 (3) MG/3ML IN SOLN Nebulization Take 3 mLs by nebulization.      Marland Kitchen OMEPRAZOLE 20 MG PO CPDR Oral Take 20 mg by mouth daily.      Marland Kitchen PANTOPRAZOLE SODIUM 40 MG PO TBEC Oral Take 1 tablet (40 mg total) by mouth daily. Take 30 minutes before breakfast. 30 tablet 11  . PREDNISONE 10 MG PO TABS  Take 4 tablets for 2 days, then 3 tablets daily for 2 days, then 2 tablets daily for 2 days then resume one tablet daily 20 tablet 0  . PREGABALIN 150 MG PO CAPS Oral Take 150 mg by mouth 2 (two) times daily.      Marland Kitchen SIMVASTATIN 10 MG PO TABS Oral Take 10 mg by mouth at bedtime.      . TRAMADOL HCL 50 MG PO TABS Oral Take 50 mg by mouth every 6 (six) hours as needed. Maximum dose= 8 tablets per day       Triage Vitals: BP 142/66  Pulse 97  Temp(Src) 98.8 F (37.1 C) (Oral)  Resp 19  Ht 5\' 2"  (1.575 m)  Wt 187 lb (84.823 kg)  BMI 34.20 kg/m2  SpO2 94%  Physical Exam  Nursing note and vitals  reviewed. Constitutional: She is oriented to person, place, and time. She appears well-developed and well-nourished. No distress.  HENT:  Head: Normocephalic and atraumatic.  Eyes: Conjunctivae and EOM are normal. No scleral icterus.  Neck: Neck supple. No thyromegaly present.  Cardiovascular: Normal rate and regular rhythm.  Exam reveals no gallop and no friction rub.   No murmur heard. Pulmonary/Chest: Effort normal. No stridor. She has wheezes (minimal wheezing). She has no rales. She exhibits no tenderness.  Abdominal: Soft. She exhibits  no distension. There is no tenderness. There is no rebound.  Musculoskeletal: Normal range of motion. She exhibits edema (1+ pitting edema in both ankles).       Tenderness in lumbar spine that radiates down the left leg, positive straight leg raise on the left  Lymphadenopathy:    She has no cervical adenopathy.  Neurological: She is alert and oriented to person, place, and time. Coordination normal.  Skin: Skin is warm and dry. No rash noted. No erythema.  Psychiatric: She has a normal mood and affect. Her behavior is normal.    ED Course  Procedures (including critical care time)  DIAGNOSTIC STUDIES: Oxygen Saturation is 94% on room air, adequate by my interpretation.    COORDINATION OF CARE: 10:20AM-Discussed treatment plan which includes blood work and MRI with pt and pt agreed to plan.   Labs Reviewed  CBC - Abnormal; Notable for the following:    WBC 14.0 (*)    RDW 16.5 (*)    All other components within normal limits  DIFFERENTIAL - Abnormal; Notable for the following:    Neutro Abs 10.6 (*)    All other components within normal limits  COMPREHENSIVE METABOLIC PANEL - Abnormal; Notable for the following:    Potassium 3.1 (*)    CO2 33 (*)    BUN 5 (*)    Albumin 3.1 (*)    Total Bilirubin 0.2 (*)    All other components within normal limits  PRO B NATRIURETIC PEPTIDE - Abnormal; Notable for the following:    Pro B Natriuretic  peptide (BNP) 335.7 (*)    All other components within normal limits  TROPONIN I  URINALYSIS, ROUTINE W REFLEX MICROSCOPIC   No results found.   No diagnosis found.    MDM        Date: 03/02/2012  Rate: 88  Rhythm: normal sinus rhythm  QRS Axis: normal  Intervals: normal  ST/T Wave abnormalities: normal  Conduction Disutrbances:none  Narrative Interpretation:   Old EKG Reviewed: none available     The chart was scribed for me under my direct supervision.  I personally performed the history, physical, and medical decision making and all procedures in the evaluation of this patient.Sydney Lennert, MD 02/01/12 1420  Sydney Lennert, MD 03/02/12 1357

## 2012-02-01 NOTE — Discharge Instructions (Signed)
Follow up with a doctor in 2-3 weeks for recheck

## 2012-02-01 NOTE — ED Notes (Signed)
Lower back pain for 2-3 months, worse over the past few weeks, swelling to lower extremities that started a few days ago became worse today, c/o sob but pt states sob is not any different than her normal.  ,pt hx of chronic sob due to copd, worse with exertion,

## 2012-03-07 ENCOUNTER — Encounter (HOSPITAL_COMMUNITY): Payer: Self-pay | Admitting: Emergency Medicine

## 2012-03-07 ENCOUNTER — Emergency Department (HOSPITAL_COMMUNITY)
Admission: EM | Admit: 2012-03-07 | Discharge: 2012-03-07 | Disposition: A | Discharge status: Home / Self Care | Payer: Medicaid Other | Attending: Emergency Medicine | Admitting: Emergency Medicine

## 2012-03-07 DIAGNOSIS — N179 Acute kidney failure, unspecified: Secondary | ICD-10-CM | POA: Insufficient documentation

## 2012-03-07 DIAGNOSIS — J4489 Other specified chronic obstructive pulmonary disease: Secondary | ICD-10-CM | POA: Insufficient documentation

## 2012-03-07 DIAGNOSIS — F17200 Nicotine dependence, unspecified, uncomplicated: Secondary | ICD-10-CM | POA: Insufficient documentation

## 2012-03-07 DIAGNOSIS — M79609 Pain in unspecified limb: Secondary | ICD-10-CM | POA: Insufficient documentation

## 2012-03-07 DIAGNOSIS — G629 Polyneuropathy, unspecified: Secondary | ICD-10-CM

## 2012-03-07 DIAGNOSIS — J449 Chronic obstructive pulmonary disease, unspecified: Secondary | ICD-10-CM | POA: Insufficient documentation

## 2012-03-07 DIAGNOSIS — K219 Gastro-esophageal reflux disease without esophagitis: Secondary | ICD-10-CM | POA: Insufficient documentation

## 2012-03-07 MED ORDER — GABAPENTIN 300 MG PO CAPS: 300.0000 mg | ORAL_CAPSULE | Freq: Three times a day (TID) | ORAL | Status: DC

## 2012-03-07 MED ORDER — GABAPENTIN 300 MG PO CAPS
300.0000 mg | ORAL_CAPSULE | Freq: Once | ORAL | Status: AC
  Administered 2012-03-07: 300 mg via ORAL
  Filled 2012-03-07: qty 1

## 2012-03-07 NOTE — ED Provider Notes (Signed)
I saw and evaluated the patient, reviewed the resident's note and I agree with the findings and plan. Has copd, no dm. No chf, liver dz, or renal dz.  C/o bilat leg swelling and pain.  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  pe no distress. Heart and lungs nl.  abd obese. Legs + 2 pitting edema with erythema c/w venous stasis changes.    No cellulitis.  No tests indicated.  Will give gabapentin for peripheral neuropathy and refer back to pcp.    Cheri Guppy, MD 03/07/12 [phone]

## 2012-03-07 NOTE — ED Provider Notes (Signed)
History     CSN: [id number]  Arrival date & time 03/07/12  0825   First MD Initiated Contact with Patient 03/07/12 [phone]      No chief complaint on file.   (Consider location/radiation/quality/duration/timing/severity/associated sxs/prior treatment) Patient is a 56 y.o. female presenting with leg pain. The history is provided by the patient.  Leg Pain  Incident onset: months. Incident location: no injury incident. There was no injury mechanism. The pain is present in the left leg, left foot, right leg and right foot. The quality of the pain is described as tingling and burning. The pain is moderate. The pain has been constant since onset. Pertinent negatives include no numbness. She reports no foreign bodies present. The symptoms are aggravated by bearing weight. She has tried nothing for the symptoms. The treatment provided no relief.    Past Medical History  Diagnosis Date  . Cancer breast  . Incontinence   . Sebaceous cyst     lt labial location  . Tobacco abuse   . GERD (gastroesophageal reflux disease)   . Depression with anxiety   . COPD (chronic obstructive pulmonary disease)   . ARF (acute renal failure) 08/07/2011    Past Surgical History  Procedure Date  . Breast lumpectomy     right with node dissection  . Esophagogastroduodenoscopy 11/11/2010    small hiatal hernia, 56 French dilator  . Colonoscopy 11/11/2010    diminutive rectal polyp otherwise normal; pathology showed  tubular adenoma  . Bladder tack   . Mastectomy     partial    Family History  Problem Relation Age of Onset  . Colon cancer Neg Hx     History  Substance Use Topics  . Smoking status: Current Everyday Smoker -- 1.0 packs/day    Types: Cigarettes  . Smokeless tobacco: Not on file   Comment: started at age 8  . Alcohol Use: No    OB History    Grav Para Term Preterm Abortions TAB SAB Ect Mult Living                  Review of Systems  Constitutional: Negative for fever, chills,  diaphoresis and fatigue.  HENT: Negative for ear pain, congestion, sore throat, facial swelling, mouth sores, trouble swallowing, neck pain and neck stiffness.   Eyes: Negative.   Respiratory: Negative for apnea, cough, chest tightness, shortness of breath and wheezing.   Cardiovascular: Positive for leg swelling. Negative for chest pain and palpitations.  Gastrointestinal: Negative for nausea, vomiting, abdominal pain, diarrhea and abdominal distention.  Genitourinary: Negative for hematuria, flank pain, vaginal discharge, difficulty urinating and menstrual problem.  Musculoskeletal: Negative for back pain and gait problem.  Skin: Negative for rash and wound.  Neurological: Negative for dizziness, tremors, seizures, syncope, facial asymmetry, numbness and headaches.  Psychiatric/Behavioral: Negative.   All other systems reviewed and are negative.    Allergies  Penicillins  Home Medications   Current Outpatient Rx  Name Route Sig Dispense Refill  . BECLOMETHASONE DIPROPIONATE 80 MCG/ACT IN AERS Inhalation Inhale 1 puff into the lungs as needed. For shortness of breath    . BUDESONIDE-FORMOTEROL FUMARATE 80-4.5 MCG/ACT IN AERO Inhalation Inhale 2 puffs into the lungs 2 (two) times daily.    . FUROSEMIDE 40 MG PO TABS Oral Take 40 mg by mouth daily.    Marland Kitchen OMEPRAZOLE 20 MG PO CPDR Oral Take 20 mg by mouth daily.    Marland Kitchen PREDNISONE 10 MG PO TABS Oral Take 10 mg  by mouth daily.      BP 129/85  Pulse 100  Temp 98.4 F (36.9 C) (Oral)  Resp 20  SpO2 96%  Physical Exam  Nursing note and vitals reviewed. Constitutional: She is oriented to person, place, and time. She appears well-developed and well-nourished. No distress.  HENT:  Head: Normocephalic and atraumatic.  Right Ear: External ear normal.  Left Ear: External ear normal.  Nose: Nose normal.  Mouth/Throat: Oropharynx is clear and moist. No oropharyngeal exudate.  Eyes: Conjunctivae and EOM are normal. Pupils are equal, round,  and reactive to light. Right eye exhibits no discharge. Left eye exhibits no discharge.  Neck: Normal range of motion. Neck supple. No JVD present. No tracheal deviation present. No thyromegaly present.  Cardiovascular: Normal rate, regular rhythm, normal heart sounds and intact distal pulses.  Exam reveals no gallop and no friction rub.   No murmur heard. Pulmonary/Chest: Effort normal and breath sounds normal. No respiratory distress. She has no wheezes. She has no rales. She exhibits no tenderness.  Abdominal: Soft. Bowel sounds are normal. She exhibits no distension. There is no tenderness. There is no rebound and no guarding.  Musculoskeletal: Normal range of motion. She exhibits edema (2+ pitting edema to bilteral mid shins).       Pain with palpation of both legs to the feet  Lymphadenopathy:    She has no cervical adenopathy.  Neurological: She is alert and oriented to person, place, and time. No cranial nerve deficit. Coordination normal.  Skin: Skin is warm. No rash noted. She is not diaphoretic.  Psychiatric: She has a normal mood and affect. Her behavior is normal. Judgment and thought content normal.    ED Course  Procedures (including critical care time)  Labs Reviewed - No data to display No results found.   No diagnosis found.    MDM  56 year old female patient with past medical history of COPD depression hypertension and diabetes presents with worsening bilateral leg pain. Patient was here month ago with the same complaints of bilateral leg pain and leg swelling. Patient says is progressively worsening canal are taken. Patient says she has pain with weight-bearing on her feet. Patient says the pain feels burning tingling sensation with some loss of sensation is there all the time not worse any time. Nothing makes him better nothing makes it worse aside from weight-bearing and palpation. 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 Patient without any physical exam findings consistent with heart failure such as S3 JVD. Patient without a history of liver or kidney failure and no exam findings consistent with that either. Patient with normal laboratory work when she was here before with the same complaints. Suspect peripheral neuropathy and not well treated. We'll give patient prescription for gabapentin and instruct her to followup with PCP for further management.  Case discussed with Dr. Allison Quarry, MD 03/07/12 [phone]

## 2012-03-07 NOTE — ED Provider Notes (Signed)
I saw and evaluated the patient, reviewed the resident's note and I agree with the findings and plan.  Cheri Guppy, MD 03/07/12 [phone]

## 2012-03-07 NOTE — ED Notes (Signed)
Pt reports 3 months of bilateral leg pain, cramps and swelling. Denies recent injury. Pt with 3-4+ edema from feet to knees.

## 2012-04-19 ENCOUNTER — Emergency Department (HOSPITAL_COMMUNITY): Payer: Medicaid Other

## 2012-04-19 ENCOUNTER — Emergency Department (HOSPITAL_COMMUNITY)
Admission: EM | Admit: 2012-04-19 | Discharge: 2012-04-19 | Disposition: A | Discharge status: Home / Self Care | Payer: Medicaid Other | Attending: Emergency Medicine | Admitting: Emergency Medicine

## 2012-04-19 ENCOUNTER — Encounter (HOSPITAL_COMMUNITY): Payer: Self-pay | Admitting: *Deleted

## 2012-04-19 DIAGNOSIS — J449 Chronic obstructive pulmonary disease, unspecified: Secondary | ICD-10-CM | POA: Insufficient documentation

## 2012-04-19 DIAGNOSIS — K219 Gastro-esophageal reflux disease without esophagitis: Secondary | ICD-10-CM | POA: Insufficient documentation

## 2012-04-19 DIAGNOSIS — F17200 Nicotine dependence, unspecified, uncomplicated: Secondary | ICD-10-CM | POA: Insufficient documentation

## 2012-04-19 DIAGNOSIS — J4489 Other specified chronic obstructive pulmonary disease: Secondary | ICD-10-CM | POA: Insufficient documentation

## 2012-04-19 DIAGNOSIS — IMO0001 Reserved for inherently not codable concepts without codable children: Secondary | ICD-10-CM | POA: Insufficient documentation

## 2012-04-19 DIAGNOSIS — M940 Chondrocostal junction syndrome [Tietze]: Secondary | ICD-10-CM | POA: Insufficient documentation

## 2012-04-19 HISTORY — DX: Fibromyalgia: M79.7

## 2012-04-19 MED ORDER — BENZONATATE 100 MG PO CAPS: 100.0000 mg | ORAL_CAPSULE | Freq: Three times a day (TID) | ORAL | Status: AC

## 2012-04-19 NOTE — ED Notes (Signed)
Pt reports episode of coughing that caused severe right sided rib cage pain. Reports same thing happened a week and a half ago. States it feels like my ribs are broken. Pt reports hx of coughing due to copd and smoking. Reports pain increases with certain movements and coughing.

## 2012-04-19 NOTE — ED Provider Notes (Signed)
History     CSN: [id number]  Arrival date & time 04/19/12  1753   First MD Initiated Contact with Patient 04/19/12 1925      Chief Complaint  Patient presents with  . Chest Pain    rib  . Cough    (Consider location/radiation/quality/duration/timing/severity/associated sxs/prior treatment) HPI  56 y.o. female in no acute distress complaining of right-sided mid axillary rib cage pain exacerbated by coughing. Patient states she thinks that her bruits are broken. She denies any trauma. Patient had a sentinel event a week ago and that resolved pain has been worsening over the course of the past 2 days. Pain is moderate 7/10 not relieved by tramadol. Exacerbated by deep breathing movement and coughing. Patient denies any fever, shortness of breath, or productive cough. She is an active smoker with a diagnosis of COPD.   Past Medical History  Diagnosis Date  . Cancer breast  . Incontinence   . Sebaceous cyst     lt labial location  . Tobacco abuse   . GERD (gastroesophageal reflux disease)   . Depression with anxiety   . COPD (chronic obstructive pulmonary disease)   . ARF (acute renal failure) 08/07/2011  . Fibromyalgia     Past Surgical History  Procedure Date  . Breast lumpectomy     right with node dissection  . Esophagogastroduodenoscopy 11/11/2010    small hiatal hernia, 56 French dilator  . Colonoscopy 11/11/2010    diminutive rectal polyp otherwise normal; pathology showed  tubular adenoma  . Bladder tack   . Mastectomy     partial    Family History  Problem Relation Age of Onset  . Colon cancer Neg Hx     History  Substance Use Topics  . Smoking status: Current Everyday Smoker -- 1.0 packs/day    Types: Cigarettes  . Smokeless tobacco: Not on file   Comment: started at age 50  . Alcohol Use: No    OB History    Grav Para Term Preterm Abortions TAB SAB Ect Mult Living                  Review of Systems  Constitutional: Negative for fever.    Musculoskeletal: Positive for arthralgias.  All other systems reviewed and are negative.    Allergies  Penicillins  Home Medications   Current Outpatient Rx  Name Route Sig Dispense Refill  . BECLOMETHASONE DIPROPIONATE 80 MCG/ACT IN AERS Inhalation Inhale 1 puff into the lungs 2 (two) times daily. For shortness of breath    . BUDESONIDE-FORMOTEROL FUMARATE 80-4.5 MCG/ACT IN AERO Inhalation Inhale 1 puff into the lungs 2 (two) times daily.     . DULOXETINE HCL 60 MG PO CPEP Oral Take 60 mg by mouth daily. Take with food    . FLUOXETINE HCL 40 MG PO CAPS Oral Take 40 mg by mouth daily.    . FUROSEMIDE 40 MG PO TABS Oral Take 40 mg by mouth daily.    Marland Kitchen LEVOTHYROXINE SODIUM 100 MCG PO TABS Oral Take 100 mcg by mouth daily.    Marland Kitchen OMEPRAZOLE 20 MG PO CPDR Oral Take 20 mg by mouth 2 (two) times daily.     Marland Kitchen PRAVASTATIN SODIUM 20 MG PO TABS Oral Take 20 mg by mouth daily.    Marland Kitchen PREDNISONE 10 MG PO TABS Oral Take 10 mg by mouth daily.    Marland Kitchen PREGABALIN 100 MG PO CAPS Oral Take 100 mg by mouth 3 (three) times daily.    Marland Kitchen  TRAMADOL HCL 50 MG PO TABS Oral Take 50-100 mg by mouth every 6 (six) hours as needed. For pain - do not exceed 6 tablets in 24 hours    . ZOLPIDEM TARTRATE 5 MG PO TABS Oral Take 5 mg by mouth at bedtime as needed. For insomnia    . BENZONATATE 100 MG PO CAPS Oral Take 1 capsule (100 mg total) by mouth every 8 (eight) hours. 21 capsule 0    BP 134/76  Pulse 98  Temp 98.8 F (37.1 C) (Oral)  Resp 24  SpO2 94%  Physical Exam  Nursing note and vitals reviewed. Constitutional: She is oriented to person, place, and time. She appears well-developed and well-nourished. No distress.  HENT:  Head: Normocephalic.  Eyes: Conjunctivae and EOM are normal.  Cardiovascular: Normal rate and regular rhythm.   Pulmonary/Chest: Effort normal and breath sounds normal. No respiratory distress. She has no wheezes. She has no rales. She exhibits tenderness.       Tenderness is reproducible  to palpation of the right lower mid axillary area.  Abdominal: Soft.  Musculoskeletal: Normal range of motion.  Lymphadenopathy:    She has no cervical adenopathy.  Neurological: She is alert and oriented to person, place, and time.  Psychiatric: She has a normal mood and affect.    ED Course  Procedures (including critical care time)  Labs Reviewed - No data to display Dg Chest 2 View  04/19/2012  *RADIOLOGY REPORT*  Clinical Data: Cough for 2 weeks.  CHEST - 2 VIEW  Comparison: Plain films of the chest 02/01/2012 and 09/08/2010.  Findings: Linear atelectasis or scar is seen in the left lung base. The lungs are otherwise clear.  The chest is hyperexpanded.  Heart size is normal.  No pneumothorax or pleural fluid.  IMPRESSION: No acute finding.  Stable compared to prior exam with changes of COPD noted.   Original Report Authenticated By: Bernadene Bell. D'ALESSIO, M.D.      1. Costochondritis, acute       MDM  56 y.o. female complaining of right midaxillary rib pain exacerbated by cough or movement. Chest x-ray is unremarkable except for COPD hyperexpansion diaphragm for a flattening consistent with prior CXRs. Pain is reproducible. I will treat her for costochondritis and ask her to follow with her primary care physician.  Counseled patient on smoking cessation.  We'll advise her to take Motrin 800 mg 3 times a day for 5 days. No will also write her for Tessalon.       Wynetta Emery, PA-C 04/19/12 2011

## 2012-04-20 NOTE — ED Provider Notes (Signed)
Medical screening examination/treatment/procedure(s) were performed by non-physician practitioner and as supervising physician I was immediately available for consultation/collaboration.    Nelia Shi, MD 04/20/12 2250

## 2012-06-20 ENCOUNTER — Emergency Department (HOSPITAL_COMMUNITY): Payer: Medicaid Other

## 2012-06-20 ENCOUNTER — Emergency Department (HOSPITAL_COMMUNITY)
Admission: EM | Admit: 2012-06-20 | Discharge: 2012-06-20 | Disposition: A | Discharge status: Home / Self Care | Payer: Medicaid Other | Attending: Emergency Medicine | Admitting: Emergency Medicine

## 2012-06-20 ENCOUNTER — Encounter (HOSPITAL_COMMUNITY): Payer: Self-pay

## 2012-06-20 DIAGNOSIS — K219 Gastro-esophageal reflux disease without esophagitis: Secondary | ICD-10-CM | POA: Insufficient documentation

## 2012-06-20 DIAGNOSIS — N179 Acute kidney failure, unspecified: Secondary | ICD-10-CM | POA: Insufficient documentation

## 2012-06-20 DIAGNOSIS — J90 Pleural effusion, not elsewhere classified: Secondary | ICD-10-CM | POA: Insufficient documentation

## 2012-06-20 DIAGNOSIS — F17200 Nicotine dependence, unspecified, uncomplicated: Secondary | ICD-10-CM | POA: Insufficient documentation

## 2012-06-20 DIAGNOSIS — IMO0001 Reserved for inherently not codable concepts without codable children: Secondary | ICD-10-CM | POA: Insufficient documentation

## 2012-06-20 DIAGNOSIS — F341 Dysthymic disorder: Secondary | ICD-10-CM | POA: Insufficient documentation

## 2012-06-20 DIAGNOSIS — J449 Chronic obstructive pulmonary disease, unspecified: Secondary | ICD-10-CM | POA: Insufficient documentation

## 2012-06-20 DIAGNOSIS — R091 Pleurisy: Secondary | ICD-10-CM

## 2012-06-20 DIAGNOSIS — N644 Mastodynia: Secondary | ICD-10-CM | POA: Insufficient documentation

## 2012-06-20 DIAGNOSIS — R0602 Shortness of breath: Secondary | ICD-10-CM | POA: Insufficient documentation

## 2012-06-20 DIAGNOSIS — Z79899 Other long term (current) drug therapy: Secondary | ICD-10-CM | POA: Insufficient documentation

## 2012-06-20 DIAGNOSIS — J4489 Other specified chronic obstructive pulmonary disease: Secondary | ICD-10-CM | POA: Insufficient documentation

## 2012-06-20 DIAGNOSIS — Z872 Personal history of diseases of the skin and subcutaneous tissue: Secondary | ICD-10-CM | POA: Insufficient documentation

## 2012-06-20 DIAGNOSIS — Z853 Personal history of malignant neoplasm of breast: Secondary | ICD-10-CM | POA: Insufficient documentation

## 2012-06-20 LAB — POCT I-STAT TROPONIN I

## 2012-06-20 LAB — CBC WITH DIFFERENTIAL/PLATELET
Eosinophils Absolute: 0.1 10*3/uL (ref 0.0–0.7)
Hemoglobin: 12.5 g/dL (ref 12.0–15.0)
Lymphs Abs: 1.1 10*3/uL (ref 0.7–4.0)
MCH: 30.9 pg (ref 26.0–34.0)
Monocytes Relative: 6 % (ref 3–12)
Neutrophils Relative %: 85 % — ABNORMAL HIGH (ref 43–77)
RBC: 4.05 MIL/uL (ref 3.87–5.11)

## 2012-06-20 LAB — URINALYSIS, ROUTINE W REFLEX MICROSCOPIC
Bilirubin Urine: NEGATIVE
Hgb urine dipstick: NEGATIVE
Specific Gravity, Urine: 1.007 (ref 1.005–1.030)
pH: 7.5 (ref 5.0–8.0)

## 2012-06-20 LAB — BASIC METABOLIC PANEL
BUN: 5 mg/dL — ABNORMAL LOW (ref 6–23)
Chloride: 97 mEq/L (ref 96–112)
GFR calc non Af Amer: >90 mL/min (ref >90)
Glucose, Bld: 102 mg/dL — ABNORMAL HIGH (ref 70–99)
Potassium: 3.1 mEq/L — ABNORMAL LOW (ref 3.5–5.1)

## 2012-06-20 MED ORDER — IOHEXOL 350 MG/ML SOLN
80.0000 mL | Freq: Once | INTRAVENOUS | Status: AC | PRN
  Administered 2012-06-20: 80 mL via INTRAVENOUS

## 2012-06-20 MED ORDER — SODIUM CHLORIDE 0.9 % IV SOLN
Freq: Once | INTRAVENOUS | Status: AC
  Administered 2012-06-20: 75 mL/h via INTRAVENOUS

## 2012-06-20 MED ORDER — POTASSIUM CHLORIDE CRYS ER 20 MEQ PO TBCR
40.0000 meq | EXTENDED_RELEASE_TABLET | Freq: Once | ORAL | Status: AC
  Administered 2012-06-20: 40 meq via ORAL
  Filled 2012-06-20: qty 2

## 2012-06-20 NOTE — ED Provider Notes (Signed)
Medical screening examination/treatment/procedure(s) were performed by non-physician practitioner and as supervising physician I was immediately available for consultation/collaboration.  Everette Dimauro, MD 06/20/12 1459 

## 2012-06-20 NOTE — ED Provider Notes (Signed)
History     CSN: [id number]  Arrival date & time 06/20/12  1033   First MD Initiated Contact with Patient 06/20/12 1037      Chief Complaint  Patient presents with  . Breast Pain    (Consider location/radiation/quality/duration/timing/severity/associated sxs/prior treatment) HPI Comments: 56 year old female with a history of breast cancer (treated in 05 with chemotherapy and radiation), COPD and fibromyalgia presents to the emergency complaining of left-sided breast/chest pain.  Onset of symptoms began approximately one week ago and described as a constant chest tightness that is sharp in nature and significantly worsened with coughing and deep inhalation.  Patient describes that it feels like "I am being stabbed with a knife".  Associated symptoms include shortness of breath, dyspnea on exertion and cough.  Patient denies fever, night sweats, chills, PND, orthopnea, claudication, unilateral leg swelling, hemoptysis, recent travel, fever, night sweats, chills, diaphoresis, exertional chest pain, palpitations, or syncope.  Patient is a current everyday smoker but denies normal replacement therapy or recent travel.  The history is provided by the patient.    Past Medical History  Diagnosis Date  . Cancer breast  . Incontinence   . Sebaceous cyst     lt labial location  . Tobacco abuse   . GERD (gastroesophageal reflux disease)   . Depression with anxiety   . COPD (chronic obstructive pulmonary disease)   . ARF (acute renal failure) 08/07/2011  . Fibromyalgia     Past Surgical History  Procedure Date  . Breast lumpectomy     right with node dissection  . Esophagogastroduodenoscopy 11/11/2010    small hiatal hernia, 56 French dilator  . Colonoscopy 11/11/2010    diminutive rectal polyp otherwise normal; pathology showed  tubular adenoma  . Bladder tack   . Mastectomy     partial    Family History  Problem Relation Age of Onset  . Colon cancer Neg Hx     History    Substance Use Topics  . Smoking status: Current Every Day Smoker -- 1.0 packs/day    Types: Cigarettes  . Smokeless tobacco: Not on file   Comment: started at age 12  . Alcohol Use: No    OB History    Grav Para Term Preterm Abortions TAB SAB Ect Mult Living                  Review of Systems  All other systems reviewed and are negative.    Allergies  Penicillins  Home Medications   Current Outpatient Rx  Name Route Sig Dispense Refill  . ALENDRONATE SODIUM 70 MG PO TABS Oral Take 70 mg by mouth every 7 (seven) days. On Monday  -  Take with a full glass of water on an empty stomach.    . BECLOMETHASONE DIPROPIONATE 80 MCG/ACT IN AERS Inhalation Inhale 1 puff into the lungs 2 (two) times daily.    . BUDESONIDE-FORMOTEROL FUMARATE 160-4.5 MCG/ACT IN AERO Inhalation Inhale 1 puff into the lungs 2 (two) times daily.    Marland Kitchen VITAMIN D 2000 UNITS PO TABS Oral Take 2,000 Units by mouth daily.    . DULOXETINE HCL 60 MG PO CPEP Oral Take 60 mg by mouth daily. Take with food    . FLUOXETINE HCL 40 MG PO CAPS Oral Take 40 mg by mouth daily.    Marland Kitchen FOLIC ACID 400 MCG PO TABS Oral Take 400 mcg by mouth daily.    . FUROSEMIDE 40 MG PO TABS Oral Take 40 mg by  mouth daily.    . IPRATROPIUM BROMIDE HFA 17 MCG/ACT IN AERS Inhalation Inhale 2 puffs into the lungs every 6 (six) hours as needed. For cough    . LEVOTHYROXINE SODIUM 100 MCG PO TABS Oral Take 100 mcg by mouth daily.    Marland Kitchen OMEPRAZOLE 20 MG PO CPDR Oral Take 20 mg by mouth 2 (two) times daily.     Marland Kitchen PRAVASTATIN SODIUM 20 MG PO TABS Oral Take 20 mg by mouth daily.    Marland Kitchen PREDNISONE 10 MG PO TABS Oral Take 10 mg by mouth daily.    Marland Kitchen PREGABALIN 100 MG PO CAPS Oral Take 100 mg by mouth 3 (three) times daily.    . TRAMADOL HCL 50 MG PO TABS Oral Take 50-100 mg by mouth every 6 (six) hours as needed. For pain - do not exceed 6 tablets in 24 hours    . VITAMIN B-12 50 MCG PO TABS Oral Take 50 mcg by mouth daily.    Marland Kitchen ZOLPIDEM TARTRATE 5 MG PO  TABS Oral Take 5 mg by mouth at bedtime as needed. For insomnia      BP 132/76  Pulse 85  Resp 18  SpO2 95%  Physical Exam  Nursing note and vitals reviewed. Constitutional: She appears well-developed and well-nourished. No distress.  HENT:  Head: Normocephalic and atraumatic.  Eyes: Conjunctivae normal and EOM are normal. Pupils are equal, round, and reactive to light.  Neck: Normal range of motion. Neck supple. Normal carotid pulses and no JVD present. Carotid bruit is not present. No rigidity. Normal range of motion present.  Cardiovascular: Normal rate, regular rhythm, S1 normal, S2 normal, normal heart sounds, intact distal pulses and normal pulses.  Exam reveals no gallop and no friction rub.   No murmur heard.      No pitting edema bilaterally, RRR, no aberrant sounds on auscultations, distal pulses intact, no carotid bruit or JVD.   Pulmonary/Chest: Effort normal and breath sounds normal. No accessory muscle usage or stridor. No respiratory distress. She exhibits tenderness (left breast and chest). She exhibits no bony tenderness.  Abdominal: Bowel sounds are normal.       Obese Soft non tender. Non pulsatile aorta.   Skin: Skin is warm, dry and intact. No rash noted. She is not diaphoretic. No cyanosis. Nails show no clubbing.    ED Course  Procedures (including critical care time)  Labs Reviewed  CBC WITH DIFFERENTIAL - Abnormal; Notable for the following:    WBC 13.0 (*)     Neutrophils Relative 85 (*)     Neutro Abs 11.1 (*)     Lymphocytes Relative 8 (*)     All other components within normal limits  BASIC METABOLIC PANEL - Abnormal; Notable for the following:    Potassium 3.1 (*)     Glucose, Bld 102 (*)     BUN 5 (*)     All other components within normal limits  URINALYSIS, ROUTINE W REFLEX MICROSCOPIC  POCT I-STAT TROPONIN I   Dg Chest 2 View  06/20/2012  *RADIOLOGY REPORT*  Clinical Data: Cough, short of breath, left chest pain, smoking history  CHEST - 2  VIEW  Comparison: Chest x-ray of 04/19/2012  Findings: The lungs are clear and somewhat hyperaerated. Mediastinal contours are stable.  The heart is within upper limits of normal.  No bony abnormality is seen.  IMPRESSION: Stable chest x-ray.  No active lung disease.   Original Report Authenticated By: Juline Patch, M.D.  Ct Angio Chest Pe W/cm &/or Wo Cm  06/20/2012  *RADIOLOGY REPORT*  Clinical Data: Chest pain and shortness of breath.  CT ANGIOGRAPHY CHEST  Technique:  Multidetector CT imaging of the chest using the standard protocol during bolus administration of intravenous contrast. Multiplanar reconstructed images including MIPs were obtained and reviewed to evaluate the vascular anatomy.  Contrast: 80mL OMNIPAQUE IOHEXOL 350 MG/ML SOLN  Comparison: Chest CT 11/04/2008.  Findings:  Mediastinum: No filling defects within the pulmonary arterial tree to suggest underlying pulmonary embolism. Heart size is normal. There is no significant pericardial fluid, thickening or pericardial calcification. There is atherosclerosis of the thoracic aorta, the great vessels of the mediastinum and the coronary arteries, including calcified atherosclerotic plaque in the left anterior descending coronary artery. No pathologically enlarged mediastinal or hilar lymph nodes. The esophagus is unremarkable in appearance.  Lungs/Pleura: Mild diffuse bronchial wall thickening is again noted.  There are some patchy areas of mild cylindrical and varicose bronchiectasis, most pronounced in the periphery of the right lower lobe where there is some mucoid plugging within an area of bronchiectasis.  Pattern of diffuse centrilobular ground-glass attenuation micro nodularity is noted, most compatible with a smoking related disease such as respiratory bronchiolitis.  No definite larger more suspicious appearing pulmonary nodules or masses are otherwise identified.  Background of mild centrilobular and paraseptal emphysema, most  pronounced in the lung apices.  No acute consolidative airspace disease.  No pleural effusions.  Upper Abdomen: Unremarkable.  Musculoskeletal: There are healing nondisplaced fractures of the lateral aspect of the right eighth and ninth ribs. Large disc osteophyte complex at T6-T7 which causes focal narrowing of the central spinal canal (9 mm AP).  There are no aggressive appearing lytic or blastic lesions noted in the visualized portions of the skeleton.  IMPRESSION: 1.  No evidence of pulmonary embolism. 2.  Healing nondisplaced fractures of the lateral aspect of the right eighth and ninth ribs. 3. Smoking related changes in the lungs, as above, compatible with a combination of the mild centrilobular and paraseptal emphysema, as well as a smoking related disease such as respiratory bronchiolitis (RB), or if the patient is symptomatic, respiratory bronchiolitis interstitial lung disease (RB-ILD). 4.  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  This is similar compared to prior study 05/07/2009. 5. Atherosclerosis, including left anterior descending coronary artery disease. Please note that although the presence of coronary artery calcium documents the presence of coronary artery disease, the severity of this disease and any potential stenosis cannot be assessed on this non-gated CT examination.  Assessment for potential risk factor modification, dietary therapy or pharmacologic therapy may be warranted, if clinically indicated.   Original Report Authenticated By: Florencia Reasons, M.D.      No diagnosis found.  Date: 06/20/2012  Rate: 90  Rhythm: normal sinus rhythm  QRS Axis: normal  Intervals: normal  ST/T Wave abnormalities: nonspecific T wave changes  Conduction Disutrbances:none  Narrative Interpretation:   Old EKG Reviewed:  unchanged    MDM   56 year old female with a history of COPD and breast cancer presents emergency department complaining of atypical sharp stabbing chest/breast pain with onset about one week ago.  Associated symptoms included cough shortness of breath and pleurisy. Patient is to be discharged with recommendation to follow up with PCP & breast clinic in regards to today's  hospital visit. Chest pain is not likely of cardiac or pulmonary etiology d/t presentation, CT angio with out PE, VSS, no tracheal deviation, no JVD or new murmur, RRR, breath sounds equal bilaterally, EKG without acute abnormalities, negative troponin, and negative CXR. Pt has been advised to return to the ED is CP becomes exertional, associated with diaphoresis or nausea, radiates to left jaw/arm, worsens or becomes concerning in any way. Pt appears reliable for follow up and is agreeable to discharge.   Case has been discussed with and seen by Dr. Jeraldine Loots who agrees with the above plan to discharge.         Jaci Carrel, [place] 06/20/12 [phone]

## 2012-06-20 NOTE — ED Notes (Signed)
Pt with pain in her breast when she coughs, began a week ago, hx of breast CA

## 2012-06-23 ENCOUNTER — Other Ambulatory Visit (HOSPITAL_COMMUNITY): Payer: Self-pay | Admitting: Internal Medicine

## 2012-06-23 DIAGNOSIS — Z09 Encounter for follow-up examination after completed treatment for conditions other than malignant neoplasm: Secondary | ICD-10-CM

## 2012-06-23 DIAGNOSIS — Z139 Encounter for screening, unspecified: Secondary | ICD-10-CM

## 2012-06-27 ENCOUNTER — Ambulatory Visit (HOSPITAL_COMMUNITY): Payer: Medicaid Other

## 2012-06-28 ENCOUNTER — Ambulatory Visit (HOSPITAL_COMMUNITY)
Admission: RE | Admit: 2012-06-28 | Discharge: 2012-06-28 | Disposition: A | Discharge status: Home / Self Care | Payer: Medicaid Other | Source: Ambulatory Visit | Attending: Internal Medicine | Admitting: Internal Medicine

## 2012-06-28 DIAGNOSIS — Z853 Personal history of malignant neoplasm of breast: Secondary | ICD-10-CM | POA: Insufficient documentation

## 2012-06-28 DIAGNOSIS — Z09 Encounter for follow-up examination after completed treatment for conditions other than malignant neoplasm: Secondary | ICD-10-CM | POA: Insufficient documentation

## 2012-10-27 ENCOUNTER — Institutional Professional Consult (permissible substitution): Payer: Medicaid Other | Admitting: Pulmonary Disease

## 2012-11-09 ENCOUNTER — Encounter: Payer: Self-pay | Admitting: Internal Medicine

## 2012-11-09 ENCOUNTER — Ambulatory Visit (INDEPENDENT_AMBULATORY_CARE_PROVIDER_SITE_OTHER): Payer: Medicaid Other | Admitting: Urgent Care

## 2012-11-09 VITALS — BP 110/58 | HR 112 | Temp 98.3°F | Ht 62.0 in | Wt 212.0 lb

## 2012-11-09 DIAGNOSIS — R1310 Dysphagia, unspecified: Secondary | ICD-10-CM

## 2012-11-09 NOTE — Progress Notes (Signed)
Referring Provider: Dorrene German, MD Primary Care Physician:  Dorrene German, MD Primary Gastroenterologist:  Dr. Jena Gauss  Chief Complaint  Patient presents with  . Dysphagia    HPI:  Sydney Blair is a 57 y.o. female here for follow up for dysphagia.  Last EGD with empiric esophageal dilation (65F) was in March 2012 by Dr Jena Gauss.  She continues to c/o occasional regurgitation.  Pills feel like they get stuck in her upper esophagus.  She has problems with solids & liquids as well.  She has pain in right ribs.  Dx with costochondritis.  Denies heartburn & indigestion.  On omeprazole 20mg  BID.  Takes Aleve 4-6 daily for right hip & rib pain for the past month.   Past Medical History  Diagnosis Date  . Cancer 2005    breast  . Incontinence   . Sebaceous cyst     lt labial location  . Tobacco abuse   . GERD (gastroesophageal reflux disease)   . Depression with anxiety   . COPD (chronic obstructive pulmonary disease)   . ARF (acute renal failure) 08/07/2011  . Fibromyalgia   . Peripheral neuropathy     Past Surgical History  Procedure Laterality Date  . Breast lumpectomy      right with node dissection  . Esophagogastroduodenoscopy  11/11/2010    RUE:AVWUJ hiatal hernia, 56 French dilator  . Colonoscopy  11/11/2010    WJX:[id number] rectal polyp otherwise normal; pathology showed  tubular adenoma  . Bladder tack    . Mastectomy      partial    Current Outpatient Prescriptions  Medication Sig Dispense Refill  . alendronate (FOSAMAX) 70 MG tablet Take 70 mg by mouth every 7 (seven) days. On Monday  -  Take with a full glass of water on an empty stomach.      Marland Kitchen aspirin 81 MG tablet Take 81 mg by mouth daily.      . beclomethasone (QVAR) 80 MCG/ACT inhaler Inhale 1 puff into the lungs 2 (two) times daily.      . budesonide-formoterol (SYMBICORT) 160-4.5 MCG/ACT inhaler Inhale 1 puff into the lungs 2 (two) times daily.      . cyanocobalamin (,VITAMIN B-12,) 1000 MCG/ML  injection Inject 1,000 mcg into the muscle every 30 (thirty) days.      . cyclobenzaprine (FLEXERIL) 10 MG tablet Take 10 mg by mouth 3 (three) times daily as needed for muscle spasms.      . DULoxetine (CYMBALTA) 60 MG capsule Take 60 mg by mouth daily. Take with food      . FLUoxetine (PROZAC) 40 MG capsule Take 40 mg by mouth daily.      . folic acid (FOLVITE) 400 MCG tablet Take 400 mcg by mouth daily.      Marland Kitchen HYDROcodone-acetaminophen (NORCO/VICODIN) 5-325 MG per tablet Take 1 tablet by mouth every 6 (six) hours as needed for pain.      Marland Kitchen ipratropium (ATROVENT HFA) 17 MCG/ACT inhaler Inhale 2 puffs into the lungs every 6 (six) hours as needed. For cough      . levothyroxine (SYNTHROID, LEVOTHROID) 100 MCG tablet Take 100 mcg by mouth daily.      . nitroGLYCERIN (NITROSTAT) 0.4 MG SL tablet Place 0.4 mg under the tongue every 5 (five) minutes as needed for chest pain.      Marland Kitchen omeprazole (PRILOSEC) 20 MG capsule Take 20 mg by mouth 2 (two) times daily.       . predniSONE (DELTASONE) 10 MG  tablet Take 10 mg by mouth daily.      . pregabalin (LYRICA) 100 MG capsule Take 300 mg by mouth 2 (two) times daily.       . traMADol (ULTRAM) 50 MG tablet Take 50-100 mg by mouth every 6 (six) hours as needed. For pain - do not exceed 6 tablets in 24 hours      . Vitamin D, Ergocalciferol, (DRISDOL) 50000 UNITS CAPS Take 50,000 Units by mouth every 7 (seven) days.      Marland Kitchen zolpidem (AMBIEN) 5 MG tablet Take 5 mg by mouth at bedtime as needed. For insomnia       No current facility-administered medications for this visit.    Allergies as of 11/09/2012 - Review Complete 11/09/2012  Allergen Reaction Noted  . Penicillins Anaphylaxis     Review of Systems: See HPI, otherwise negative ROS  Physical Exam: BP 110/58  Pulse 112  Temp(Src) 98.3 F (36.8 C) (Oral)  Ht 5\' 2"  (1.575 m)  Wt 212 lb (96.163 kg)  BMI 38.77 kg/m2 General:   Alert,  Well-developed, obese, pleasant and cooperative in NAD Eyes:   Sclera clear, no icterus.   Conjunctiva pink. Mouth:  No deformity or lesions, oropharynx pink and moist. Neck:  Supple; no masses or thyromegaly. Heart:  Mild tachycardia. Abdomen:  Normal bowel sounds.  No bruits.  Soft, non-tender and non-distended without masses, hepatosplenomegaly or hernias noted.  No guarding or rebound tenderness.   Rectal:  Deferred. Msk:  Symmetrical without gross deformities.  Pulses:  Normal pulses noted. Extremities:  +clubbing.  Trace ankle edema bilat. Neurologic:  Alert and oriented x4;  grossly normal neurologically. Skin:  Intact without significant lesions or rashes.

## 2012-11-09 NOTE — Patient Instructions (Addendum)
We will call you with results of your barium pill esophogram.  If you do not receive a call by a week after your test, please call us. Dysphagia 2 diet 1-800-QUIT-NOW for help quitting smoking Avoid all anti-inflammatories we discussed.  You may use 1 Aleve daily if needed for rib, joint pain. Continue omeprazole 20mg  before breakfast & dinner.  Dysphagia Diet Level 2, Mechanically Altered This dysphagia mechanically altered diet is restricted to:  Foods that are moist, soft-textured, and easy to chew and swallow.  Meats that are ground or minced to no larger than -inch pieces. Meats are moist with gravy or sauce added.  Foods that do not include bread or bread-like textures except soft pancakes, well-moistened with syrup or sauce.  Textures with some chewing ability required.  Casseroles without rice.  Cooked vegetables that are less than -inch in size and easily mashed with a fork. No cooked corn, peas, broccoli, cauliflower, cabbage, Brussels sprouts, asparagus, or other fibrous, non-tender, or rubbery cooked vegetables.  Canned fruit except for pineapple. Fruit must be cut into no larger than -inch pieces.  Foods that do not include nuts, seeds, coconut, or sticky textures. FOOD TEXTURES Includes all foods listed on Dysphagia Diet Level 1, Pureed, in addition to the foods listed below. Beverages  Recommended: All beverages thickened to recommended consistency with minimal amounts of texture pulp. Any texture should be suspended in the liquid and should not fall out.  Avoid: All others.  You are currently limited to one of the following liquid consistency levels:  Thin.  Nectar-like.  Honey-like.  Spoon-thick. Breads  Recommended: Soft pancakes, well-moistened with syrup or sauce.  Avoid: All others. Cereals  Recommended: Cooked cereals with little texture, including oatmeal. Unprocessed wheat bran stirred into cereals for bulk. If thin liquids are restricted, it  is important that all of the liquid is absorbed into the cereal.  Avoid: All dry cereals and any cooked cereals that may contain flax seeds or other seeds or nuts. Whole-grain, dry, or coarse cereals. Cereals with nuts, seeds, dried fruit, or coconut. Desserts  Recommended: Pudding, custard. Soft fruit pies with bottom crust only. Canned fruit (excluding pineapple). Soft, moist cakes with icing.  Avoid: Dry, coarse cakes and cookies. Anything with nuts, seeds, coconut, pineapple, or dried fruit. Breakfast yogurt with nuts. Rice or bread pudding.  These foods are considered thin liquids and should be avoided if thin liquids are restricted:  Frozen malts, milk shakes, frozen yogurt, eggnog, nutritional supplements, ice cream, sherbet, regular or sugar-free gelatin, or any foods that become thin liquid at either room temperature, 70 F (21.1 C) or body temperature, 98 F (36.7 C). Fats  Recommended: Butter, margarine, cream for cereal (depending on liquid consistency recommendations), gravy, cream sauces, sour cream, sour cream dips with soft additives, mayonnaise, salad dressings, cream cheese, cream cheese spreads with soft additives, whipped toppings.  Avoid: All fats with coarse or chunky additives. Fruits  Recommended: Soft drained, canned, or cooked fruits without seeds or skin. Fresh soft and ripe banana. Fruit juices with a small amount of pulp. If thin liquids are restricted, fruit juices should be thickened to appropriate consistency.  Avoid: Fresh or frozen fruits. Cooked fruit with skin or seeds. Dried fruits. Fresh, canned, or cooked pineapple. Meats and Meat Substitutes Meat pieces should not exceed -inch cubes and should be tender.  Recommended: Moistened ground or cooked meat, poultry, or fish. Moist ground or tender meat may be served with gravy or sauce. Casseroles without rice. Moist  macaroni and cheese, well-cooked pasta with meat sauce, tuna noodle casserole, soft, moist  lasagna. Moist meatballs, meatloaf, or fish loaf. Protein salads, such as tuna or egg without large chunks, celery, or onion. Cottage cheese, smooth quiche without large chunks. Poached, scrambled, or soft-cooked eggs (egg yolks should not be "runny" but should be moist and able to be mashed with butter, margarine, or other moisture added to them). Cook eggs to 160 F (71.1 C) or use pasteurized eggs for safety. Souffls may have small, soft chunks. Tofu. Well-cooked, slightly mashed, moist legumes such as baked beans. All meats or protein substitutes should be served with sauces or moistened to help maintain cohesiveness in the mouth.  Avoid: Dry meats and tough meats, such as bacon, sausage, hot dogs, and bratwurst. Dry casseroles or casseroles with rice or large chunks. Peanut butter. Cheese slices and cubes. Hard-cooked or crisp fried eggs. Sandwiches. Pizza. Potatoes and Starches  Recommended: Well-cooked, moistened, boiled, baked, or mashed potatoes. Well-cooked shredded hash brown potatoes that are not crisp. All potatoes need to be moist and in sauces. Well-cooked noodles in sauce. Spaetzel or soft dumplings that have been moistened with butter or gravy.  Avoid: Potato skins and chips. Fried or French-fried potatoes. Rice. Soups  Recommended: Soups with easy-to-chew or easy-to-swallow meats or vegetables. Contents in soups should be less than -inch pieces. Soups will need to be thickened to appropriate consistency if soup is thinner than prescribed liquid consistency.  Avoid: Soups with large chunks of meat and vegetables. Soups with rice, corn, peas. Vegetables  Recommended: All soft, well-cooked vegetables. Vegetables should be less than -inch pieces. They should be easily mashed with a fork.  Avoid: Cooked corn and peas. Broccoli, cabbage, Brussels sprouts, asparagus, or other fibrous, non-tender, or rubbery cooked vegetables. Miscellaneous  Recommended: Jams and preserves without  seeds, jelly. Sauces or salsas with small, tender, less than -inch pieces. Soft, smooth chocolate bars that are easily chewed.  Avoid: Seeds, nuts, coconut, or sticky foods. Chewy candies such as caramels or licorice. Document Released: 08/09/2005 Document Revised: 11/01/2011 Document Reviewed: 09/01/2009 [facility] Patient Information 2013 [place], [place].

## 2012-11-09 NOTE — Assessment & Plan Note (Addendum)
Sydney Blair is a pleasant 57 y.o. female with persistent dysphagia with both solids & liquids despite empiric dilation with 73F by Dr Jena Gauss in 2012.  Check BPE for occult stricture, ring or web. Query oropharyngeal component.  Excessive ALEVE use should be discontinued due to potential GI & renal toxicity. Dysphagia 2 diet 1-800-QUIT-NOW for help quitting smoking Avoid all anti-inflammatories we discussed.  You may use 1 Aleve daily if needed for rib, joint pain. Continue omeprazole 20mg  before breakfast & dinner.

## 2012-11-10 NOTE — Progress Notes (Signed)
Faxed to PCP

## 2012-11-13 ENCOUNTER — Institutional Professional Consult (permissible substitution): Payer: Medicaid Other | Admitting: Pulmonary Disease

## 2012-11-14 ENCOUNTER — Other Ambulatory Visit: Payer: Self-pay | Admitting: Neurology

## 2012-11-14 ENCOUNTER — Other Ambulatory Visit (HOSPITAL_COMMUNITY): Payer: Medicaid Other

## 2012-11-14 DIAGNOSIS — R42 Dizziness and giddiness: Secondary | ICD-10-CM

## 2012-11-16 ENCOUNTER — Ambulatory Visit (HOSPITAL_COMMUNITY)
Admission: RE | Admit: 2012-11-16 | Discharge: 2012-11-16 | Disposition: A | Discharge status: Home / Self Care | Payer: Medicaid Other | Source: Ambulatory Visit | Attending: Neurology | Admitting: Neurology

## 2012-11-16 ENCOUNTER — Other Ambulatory Visit (HOSPITAL_COMMUNITY): Payer: Medicaid Other

## 2012-11-16 DIAGNOSIS — I10 Essential (primary) hypertension: Secondary | ICD-10-CM | POA: Insufficient documentation

## 2012-11-16 DIAGNOSIS — I2510 Atherosclerotic heart disease of native coronary artery without angina pectoris: Secondary | ICD-10-CM | POA: Insufficient documentation

## 2012-11-16 DIAGNOSIS — R0989 Other specified symptoms and signs involving the circulatory and respiratory systems: Secondary | ICD-10-CM | POA: Insufficient documentation

## 2012-11-16 DIAGNOSIS — R42 Dizziness and giddiness: Secondary | ICD-10-CM | POA: Insufficient documentation

## 2012-11-22 ENCOUNTER — Other Ambulatory Visit: Payer: Self-pay | Admitting: Neurology

## 2012-12-01 ENCOUNTER — Encounter: Payer: Self-pay | Admitting: Pulmonary Disease

## 2012-12-01 ENCOUNTER — Ambulatory Visit (INDEPENDENT_AMBULATORY_CARE_PROVIDER_SITE_OTHER): Payer: Medicaid Other | Admitting: Pulmonary Disease

## 2012-12-01 ENCOUNTER — Other Ambulatory Visit: Payer: Medicaid Other

## 2012-12-01 VITALS — BP 138/74 | HR 95 | Temp 99.1°F | Ht 62.0 in | Wt 212.8 lb

## 2012-12-01 DIAGNOSIS — J84115 Respiratory bronchiolitis interstitial lung disease: Secondary | ICD-10-CM | POA: Insufficient documentation

## 2012-12-01 DIAGNOSIS — R0989 Other specified symptoms and signs involving the circulatory and respiratory systems: Secondary | ICD-10-CM

## 2012-12-01 MED ORDER — TIOTROPIUM BROMIDE MONOHYDRATE 18 MCG IN CAPS: 18.0000 ug | ORAL_CAPSULE | Freq: Every day | RESPIRATORY_TRACT | Status: DC

## 2012-12-01 MED ORDER — PREDNISONE 5 MG PO TABS: ORAL_TABLET | ORAL | Status: DC

## 2012-12-01 NOTE — Assessment & Plan Note (Signed)
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  Unfortunately, the patient continues to smoke, and I have told her there is no treatment for this disease except total smoking cessation.  I have also told her this can be a progressive disease if she does not stop smoking.  It is really unclear to me if she has chronic airflow obstruction or not, and she will obviously need pulmonary function studies for evaluation.  I will also send an alpha one antitrypsin level for completeness.

## 2012-12-01 NOTE — Progress Notes (Signed)
  Subjective:    Patient ID: Sydney Blair, female    DOB: 1956/07/09, 57 y.o.   MRN: [id number]  HPI The patient is a 57 year old female who I've been asked to see for ongoing breathing issues and chronic respiratory failure.  The patient has had breathing issues for 3-4 years, and feels they are getting worse.  It has gotten to the point that she will get winded bringing groceries in from the car, or even attempting light housework.  Most of the time, she will get short of breath just walking through the house.  She has a presumed diagnosis of COPD, but has never had pulmonary function studies.  She has been on various inhalers, as well as prednisone at 10 mg a day for the last 3 years.  She is also wearing oxygen while she sleeps, and as needed during the day.  Unfortunately she continues to smoke.  The patient has a chronic cough that is primarily dry in nature, and does have intermittent lower extremity edema.  She also tells me that her weight has increased 100 pounds in the last 2 years.  She has had a CT of her chest which shows mild emphysematous changes, but also changes suggestive of respiratory bronchiolitis with interstitial lung disease.   Review of Systems  Constitutional: Negative for fever and unexpected weight change.  HENT: Positive for congestion. Negative for ear pain, nosebleeds, sore throat, rhinorrhea, sneezing, trouble swallowing, dental problem, postnasal drip and sinus pressure.   Eyes: Negative for redness and itching.  Respiratory: Positive for cough, chest tightness, shortness of breath and wheezing.   Cardiovascular: Positive for chest pain and leg swelling. Negative for palpitations.  Gastrointestinal: Negative for nausea and vomiting.  Genitourinary: Negative for dysuria.  Musculoskeletal: Negative for joint swelling.  Skin: Negative for rash.  Neurological: Positive for headaches.  Hematological: Does not bruise/bleed easily.  Psychiatric/Behavioral: Positive  for dysphoric mood. The patient is nervous/anxious.        Objective:   Physical Exam Constitutional: obese female, no acute distress  HENT:  Nares patent without discharge  Oropharynx without exudate, palate and uvula are thick and very elongated  Eyes:  Perrla, eomi, no scleral icterus  Neck:  No JVD, no TMG  Cardiovascular:  Normal rate, regular rhythm, no rubs or gallops.  No murmurs        Intact distal pulses but diminished.  Pulmonary :  Mildly decreased breath sounds, no stridor or respiratory distress   No rales, rhonchi, or wheezing  Abdominal:  Soft, nondistended, bowel sounds present.  No tenderness noted.   Musculoskeletal:  1+ lower extremity edema noted.  Lymph Nodes:  No cervical lymphadenopathy noted  Skin:  No cyanosis noted  Neurologic:  Alert, appropriate, moves all 4 extremities without obvious deficit.         Assessment & Plan:

## 2012-12-01 NOTE — Patient Instructions (Addendum)
Stay on symbicort 2 puffs am and pm everyday no matter what.  Rinse mouth well.  Stop qvar, atrovent. Decrease prednisone to 7.5mg  a day for a week, then decrease to 5mg  each day and stay there. Will start on spiriva one inhalation each am everyday. You must stop smoking, or your breathing will never improve. Will check blood test today to evaluate for inherited form of lung disease Will see you back in 3 weeks, and do breathing studies the same day.

## 2012-12-01 NOTE — Assessment & Plan Note (Signed)
The pt has doe that is multifactorial.  She has some type of underlying lung disease, but unclear if copd or just RBILD with ongoing airway inflammation from smoking.  She has gained 100 pounds in 2 yrs, and is clearly deconditioned getting up on exam table.  She has persistent LE edema with changes of LVH with diastolic dysfxn on recent echo, and would benefit from diuresis.  I will leave that to her primary md.

## 2012-12-05 LAB — ALPHA-1 ANTITRYPSIN PHENOTYPE: A-1 Antitrypsin: 171 mg/dL (ref 83–199)

## 2012-12-06 ENCOUNTER — Inpatient Hospital Stay (HOSPITAL_COMMUNITY): Admission: RE | Admit: 2012-12-06 | Payer: Medicaid Other | Source: Ambulatory Visit

## 2012-12-07 ENCOUNTER — Telehealth: Payer: Self-pay | Admitting: *Deleted

## 2012-12-07 NOTE — Telephone Encounter (Signed)
Unable to reach patient...no working numbers for the pt available--emergency contact is D/C as well. Will send letter to pt to contact our office.

## 2012-12-11 ENCOUNTER — Ambulatory Visit (HOSPITAL_COMMUNITY)
Admission: RE | Admit: 2012-12-11 | Discharge: 2012-12-11 | Disposition: A | Discharge status: Home / Self Care | Payer: Medicaid Other | Source: Ambulatory Visit | Attending: Urgent Care | Admitting: Urgent Care

## 2012-12-11 DIAGNOSIS — K224 Dyskinesia of esophagus: Secondary | ICD-10-CM | POA: Insufficient documentation

## 2012-12-11 DIAGNOSIS — R1310 Dysphagia, unspecified: Secondary | ICD-10-CM | POA: Insufficient documentation

## 2012-12-13 NOTE — Progress Notes (Signed)
Quick Note:  No esophageal stricture. laryngeal penetration. ?aspiration but not "caught" on BPE Recommend speech therapy evaluation. ______

## 2012-12-14 NOTE — Progress Notes (Signed)
Referral has been faxed to speech therapy at [facility] and they will contact Mrs. Savarino to schedule date & time

## 2012-12-14 NOTE — Progress Notes (Signed)
Quick Note:  Tried to contact pt- no phone number listed for pt. Tried to call emergency contact- that number has been changed. Letter mailed to pt. ______

## 2012-12-15 ENCOUNTER — Telehealth: Payer: Self-pay | Admitting: Pulmonary Disease

## 2012-12-15 NOTE — Telephone Encounter (Signed)
Patient aware of lab results. New number has been put in pt chart---primary number [phone] Nothing further needed.

## 2012-12-19 ENCOUNTER — Telehealth: Payer: Self-pay

## 2012-12-19 NOTE — Telephone Encounter (Signed)
Pt received letter and called the office. She is aware of her BPE results and the need for speech therapy. She said they have already called her and she is scheduled for next week. Pts phone number has already been changed in epic.

## 2012-12-25 ENCOUNTER — Ambulatory Visit (HOSPITAL_COMMUNITY)
Admission: RE | Admit: 2012-12-25 | Discharge: 2012-12-25 | Disposition: A | Discharge status: Home / Self Care | Payer: Medicaid Other | Source: Ambulatory Visit | Attending: Gastroenterology | Admitting: Gastroenterology

## 2012-12-25 DIAGNOSIS — R1310 Dysphagia, unspecified: Secondary | ICD-10-CM | POA: Insufficient documentation

## 2012-12-25 DIAGNOSIS — IMO0001 Reserved for inherently not codable concepts without codable children: Secondary | ICD-10-CM | POA: Insufficient documentation

## 2012-12-26 ENCOUNTER — Ambulatory Visit (HOSPITAL_COMMUNITY)
Admission: RE | Admit: 2012-12-26 | Discharge: 2012-12-26 | Disposition: A | Discharge status: Home / Self Care | Payer: Medicaid Other | Source: Ambulatory Visit | Attending: Pulmonary Disease | Admitting: Pulmonary Disease

## 2012-12-26 ENCOUNTER — Encounter: Payer: Self-pay | Admitting: Pulmonary Disease

## 2012-12-26 ENCOUNTER — Ambulatory Visit (INDEPENDENT_AMBULATORY_CARE_PROVIDER_SITE_OTHER): Payer: Medicaid Other | Admitting: Pulmonary Disease

## 2012-12-26 VITALS — BP 108/70 | HR 88 | Temp 96.9°F | Ht 61.0 in | Wt 207.4 lb

## 2012-12-26 DIAGNOSIS — J988 Other specified respiratory disorders: Secondary | ICD-10-CM | POA: Insufficient documentation

## 2012-12-26 DIAGNOSIS — J439 Emphysema, unspecified: Secondary | ICD-10-CM

## 2012-12-26 DIAGNOSIS — R0609 Other forms of dyspnea: Secondary | ICD-10-CM | POA: Insufficient documentation

## 2012-12-26 DIAGNOSIS — R062 Wheezing: Secondary | ICD-10-CM | POA: Insufficient documentation

## 2012-12-26 DIAGNOSIS — R059 Cough, unspecified: Secondary | ICD-10-CM | POA: Insufficient documentation

## 2012-12-26 DIAGNOSIS — J438 Other emphysema: Secondary | ICD-10-CM

## 2012-12-26 DIAGNOSIS — J84115 Respiratory bronchiolitis interstitial lung disease: Secondary | ICD-10-CM

## 2012-12-26 DIAGNOSIS — R0989 Other specified symptoms and signs involving the circulatory and respiratory systems: Secondary | ICD-10-CM | POA: Insufficient documentation

## 2012-12-26 DIAGNOSIS — R05 Cough: Secondary | ICD-10-CM | POA: Insufficient documentation

## 2012-12-26 LAB — PULMONARY FUNCTION TEST

## 2012-12-26 MED ORDER — ALBUTEROL SULFATE (5 MG/ML) 0.5% IN NEBU
2.5000 mg | INHALATION_SOLUTION | Freq: Once | RESPIRATORY_TRACT | Status: AC
  Administered 2012-12-26: 2.5 mg via RESPIRATORY_TRACT

## 2012-12-26 NOTE — Assessment & Plan Note (Signed)
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

## 2012-12-26 NOTE — Progress Notes (Signed)
  Subjective:    Patient ID: Sydney Blair, female    DOB: 18-Jan-1956, 56 y.o.   MRN: [id number]  HPI The patient comes in today for followup of PFTs.  She was found to have moderate airflow obstruction, definite air trapping, no restriction, and a diffusion capacity that was moderately reduced at 52% of predicted.  I have reviewed the study with her in detail, and answered all of her questions.  The patient states since being on Spiriva, she has seen a decrease in her cough and mucus.  Since decreasing her prednisone, she has seen no change in her breathing.  It should be noted, she continues to smoke.   Review of Systems  Constitutional: Negative for fever and unexpected weight change.  HENT: Negative for ear pain, nosebleeds, congestion, sore throat, rhinorrhea, sneezing, trouble swallowing, dental problem, postnasal drip and sinus pressure.   Eyes: Negative for redness and itching.  Respiratory: Positive for cough, shortness of breath and wheezing. Negative for chest tightness.   Cardiovascular: Positive for leg swelling ( feet swell//numb feet). Negative for palpitations.  Gastrointestinal: Negative for nausea and vomiting.  Genitourinary: Negative for dysuria.  Musculoskeletal: Negative for joint swelling.  Skin: Negative for rash.  Neurological: Negative for headaches.  Hematological: Does not bruise/bleed easily.  Psychiatric/Behavioral: Negative for dysphoric mood. The patient is not nervous/anxious.        Objective:   Physical Exam Obese female in no acute distress Nose without purulence or discharge noted Oropharynx clear Neck without lymphadenopathy or thyromegaly Chest with decreased breath sounds, a few rhonchi bilaterally. Cardiac exam is regular rate and rhythm Lower extremities with mild edema, no cyanosis Alert and oriented, moves all 4 extremities.       Assessment & Plan:

## 2012-12-26 NOTE — Assessment & Plan Note (Signed)
The patient surprisingly had moderate disease by PFTs today, she is on an excellent bronchodilator regimen currently.  I see no reason for her to be steroid dependent, although her ongoing smoking does lead to chronic airway inflammation and increased risk of recurrent exacerbations.  I had a long discussion with her about smoking cessation and its importance.

## 2012-12-26 NOTE — Patient Instructions (Addendum)
Stay on symbicort and spiriva Decrease prednisone to 2.5mg  ( 1/2 of a five mg tab) each day for one week, then stop Stop smoking!  This is the most important treatment for you followup with me in 6mos if doing well.

## 2012-12-27 ENCOUNTER — Encounter (HOSPITAL_COMMUNITY): Payer: Medicaid Other

## 2013-01-04 ENCOUNTER — Telehealth: Payer: Self-pay

## 2013-01-04 NOTE — Telephone Encounter (Signed)
I'm not sure. Were they faxed to [place]? I can't find in epic. I am cc'ing Verlon Au. Looks like she had followed-up on the BPE and referred to speech in the absence of [place]. I don't know if she has seen it? I can address it, as long as I know where to look.

## 2013-01-04 NOTE — Telephone Encounter (Signed)
Pt called wanting the results from the speech therapy.Please advise

## 2013-01-08 NOTE — Telephone Encounter (Signed)
Please request results

## 2013-01-09 NOTE — Telephone Encounter (Signed)
Please request copy of speech therapy results. I could not find in EPIC.

## 2013-01-11 NOTE — Telephone Encounter (Signed)
Sydney Blair can you please try and see if we can get the results for Speech Therapy

## 2013-01-12 NOTE — Telephone Encounter (Signed)
I spoke with Angelica Chessman at [facility] [employer] and she will be faxing results to [place].

## 2013-01-16 NOTE — Progress Notes (Signed)
Clinical/Bedside Swallow Evaluation Patient Details  Name: Sydney Blair MRN: [id number] Date of Birth: [birth year]  Today's Date: 12/25/2012 Time:  -     Past Medical History:  Past Medical History  Diagnosis Date  . Cancer 2005    breast  . Incontinence   . Sebaceous cyst     lt labial location  . Tobacco abuse   . GERD (gastroesophageal reflux disease)   . Depression with anxiety   . COPD (chronic obstructive pulmonary disease)   . ARF (acute renal failure) 08/07/2011  . Fibromyalgia   . Peripheral neuropathy    Past Surgical History:  Past Surgical History  Procedure Laterality Date  . Breast lumpectomy      right with node dissection  . Esophagogastroduodenoscopy  11/11/2010    NWG:[id number] hiatal hernia, 56 French dilator  . Colonoscopy  11/11/2010    [id number] rectal polyp otherwise normal; pathology showed  tubular adenoma  . Bladder tack    . Mastectomy      partial   HPI:  Symptoms/Limitations Symptoms: Pt c/o hoarseness and dysphagia to solids and liquids x 6-7 months ago  Prior Functional Status  Cognitive/Linguistic Baseline: Within functional limits Type of Home: Mobile home  General  Date of Onset: 08/23/12 HPI: Sydney Blair is a 57 yo woman who was referred for clinical swallow evaluation by Jeraldine Loots due to penetration on BPE completed on 12/14/2012. Positive TOB use. Type of Study: Bedside swallow evaluation Previous Swallow Assessment: None on record (has had BPE and EGD in past) Diet Prior to this Study: Regular;Thin liquids Temperature Spikes Noted: No Respiratory Status: Room air History of Recent Intubation: No Behavior/Cognition: Alert;Cooperative;Pleasant mood Oral Cavity - Dentition: Adequate natural dentition Self-Feeding Abilities: Able to feed self Patient Positioning: Upright in chair Baseline Vocal Quality: Hoarse Volitional Cough: Strong;Congested Volitional Swallow: Able to elicit  Oral Motor/Sensory Function   Overall Oral Motor/Sensory Function: Appears within functional limits for tasks assessed  Consistency Results  Ice Chips Ice chips: Not tested  Thin Liquid Thin Liquid: Within functional limits Presentation: Cup;Straw  Nectar Thick Liquid Nectar Thick Liquid: Not tested  Honey Thick Liquid Honey Thick Liquid: Not tested  Puree Puree: Within functional limits  Regular textures: Within functional limits  SLP Goals   N/A  Assessment/Plan  Patient Active Problem List   Diagnosis Date Noted  . COPD (chronic obstructive pulmonary disease) with emphysema 12/26/2012  . DOE (dyspnea on exertion) 12/01/2012  . 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 Dysphagia 11/09/2012  . Influenza A 08/07/2011  . COPD exacerbation 08/07/2011  . Hypokalemia 08/07/2011  . Myositis 08/07/2011  . Dehydration 08/07/2011  . ARF (acute renal failure) 08/07/2011  . Tubular adenoma 07/20/2011  . INSOMNIA 02/26/2008  . CERUMEN IMPACTION, RIGHT 01/23/2008  . 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 NECK PAIN 11/22/2007  . HAMMER TOE 11/22/2007  . ANXIETY 01/30/2007  . TOBACCO ABUSE 01/30/2007  . DEPRESSION 01/30/2007  . GERD 01/30/2007  . PROLAPSE, VAGINAL WALL, CYSTOCELE, MIDLINE 01/10/2007  . INCONTINENCE 01/10/2007  . SEBACEOUS CYST 08/17/2006  . ADENOCARCINOMA, BREAST, RIGHT 12/01/2003   General Behavior During Therapy: [facility] for tasks assessed/performed Cognition: WFL for tasks performed  SLP Assessment/Plan Clinical Impression Statement: Pt shows no overt signs or symptoms of aspiration during clinical exam, although she complains of intermittent dysphagia with solids and liquids. BPE was positive for age related esophageal dysmotility and one episode of penetration to the vocal folds without witnessed aspiration with thin barium. Pt  does present with hoarse vocal quality that she says is not painful and started after being diagnosed with fibromyalgia  about 6-7 months ago. She states that she has a hard time swallowing liquids and pills. Pt does take a PPI twice daily. Pt was advised to take small sips of liquid and chew food thoroughly. If she continues to have difficulty with swallowing, an objective study via MBSS would provide more information and should be ordered. Pt continues to smoke which likely contributes to hoarseness and coughing. No further SLP services indicated at this time, unless physician desires MBSS. She goes for PFT tomorrow in [place].  Thank you,  Havery Moros, CCC-SLP [phone]  PORTER,DABNEY 12/25/2012,8:17 PM

## 2013-01-17 NOTE — Telephone Encounter (Signed)
Reviewed speech pathologist note. Please let patient know the following: No evidence of aspiration or oropharyngeal dysphagia. Continue to use plenty of liquid when taking her pills and eating solid food. Set upright 30 minutes after eating and taking medication.

## 2013-01-17 NOTE — Telephone Encounter (Signed)
Pt is aware of results. 

## 2013-01-25 ENCOUNTER — Ambulatory Visit (HOSPITAL_COMMUNITY)
Admission: RE | Admit: 2013-01-25 | Discharge: 2013-01-25 | Disposition: A | Discharge status: Home / Self Care | Payer: Medicaid Other | Source: Ambulatory Visit | Attending: Neurology | Admitting: Neurology

## 2013-01-25 DIAGNOSIS — R1310 Dysphagia, unspecified: Secondary | ICD-10-CM | POA: Insufficient documentation

## 2013-01-25 DIAGNOSIS — R2681 Unsteadiness on feet: Secondary | ICD-10-CM | POA: Insufficient documentation

## 2013-01-25 DIAGNOSIS — IMO0001 Reserved for inherently not codable concepts without codable children: Secondary | ICD-10-CM | POA: Insufficient documentation

## 2013-01-25 NOTE — Evaluation (Addendum)
Physical Therapy Evaluation  Patient Details  Name: Sydney Blair MRN: [id number] Date of Birth: 13-Jan-1956  Today's Date: 01/25/2013 Time: 0810-0848 PT Time Calculation (min): 38 min Charges: Evaluation: 1             Visit#: 1 of 4  Re-eval:   Assessment Diagnosis: unsteady gait Next MD Visit: Dr. Gerilyn Pilgrim - next week Prior Therapy: SLP evaluation  Authorization: [employer]    Authorization Time Period:    Authorization Visit#: 1 of 4   Past Medical History:  Past Medical History  Diagnosis Date  . Cancer 2005    breast  . Incontinence   . Sebaceous cyst     lt labial location  . Tobacco abuse   . GERD (gastroesophageal reflux disease)   . Depression with anxiety   . COPD (chronic obstructive pulmonary disease)   . ARF (acute renal failure) 08/07/2011  . Fibromyalgia   . Peripheral neuropathy    Past Surgical History:  Past Surgical History  Procedure Laterality Date  . Breast lumpectomy      right with node dissection  . Esophagogastroduodenoscopy  11/11/2010    YQM:VHQIO hiatal hernia, 56 French dilator  . Colonoscopy  11/11/2010    NGE:XBMWUXLKGM rectal polyp otherwise normal; pathology showed  tubular adenoma  . Bladder tack    . Mastectomy      partial    Subjective Symptoms/Limitations Symptoms: PMH: Periphreal neuropathy, fibromyalgia, hx of breast cancer Pertinent History: Pt is referred to PT for unsteady gait.  Her c/co is difficulty walking because her legs get a "real funny feeling", she doesnt feel that her legs are weak and reports increased falls.  Patient Stated Goals: Pt reports she doesnt want to feel unsteady anymore, is unsure if PT will work Pain Assessment Currently in Pain?: Yes Pain Score:   4 (FACES) Pain Location: Foot Pain Orientation: Right;Left Pain Type: Neuropathic pain Pain Radiating Towards: BLE Pain Onset: More than a month ago Pain Relieving Factors: unable to find anything to help with pain Effect of Pain on Daily  Activities: difficulty walking  Precautions/Restrictions  Precautions Precautions: Fall Precaution Comments: hx of cancer  Balance Screening Balance Screen Has the patient fallen in the past 6 months: Yes How many times?: 6 Has the patient had a decrease in activity level because of a fear of falling? : No Is the patient reluctant to leave their home because of a fear of falling? : No  Prior Functioning  Home Living Type of Home: Mobile home Prior Function Vocation: Unemployed Comments: enjoys taking her grandson to the park.   Cognition/Observation Observation/Other Assessments Observations: mild swelling to her BLE  Sensation/Coordination/Flexibility/Functional Tests Functional Tests Functional Tests: Activity specific balance confidence scale (ABC): 20%  Assessment RLE Strength Right Hip Flexion: 4/5 Right Hip ABduction: 3+/5 Right Hip ADduction: 3/5 Right Knee Flexion: 5/5 Right Knee Extension: 5/5 LLE Strength Left Hip Flexion: 4/5 Left Hip ABduction: 3/5 Left Hip ADduction: 3/5 Left Knee Flexion: 5/5 Left Knee Extension: 5/5  Mobility/Balance  Ambulation/Gait Ambulation/Gait: Yes Assistive device: Straight cane Static Standing Balance Single Leg Stance - Right Leg: 3 Single Leg Stance - Left Leg: 3 Tandem Stance - Right Leg: 0 Tandem Stance - Left Leg: 0 Rhomberg - Eyes Opened: 60 Rhomberg - Eyes Closed: 10 Berg Balance Test Sit to Stand: Able to stand without using hands and stabilize independently Standing Unsupported: Able to stand safely 2 minutes Sitting with Back Unsupported but Feet Supported on Floor or Stool: Able to sit  safely and securely 2 minutes Stand to Sit: Sits safely with minimal use of hands Transfers: Able to transfer safely, definite need of hands Standing Unsupported with Eyes Closed: Able to stand 10 seconds safely Standing Ubsupported with Feet Together: Able to place feet together independently and stand 1 minute safely From  Standing, Reach Forward with Outstretched Arm: Can reach forward >12 cm safely (5") From Standing Position, Pick up Object from Floor: Able to pick up shoe, needs supervision From Standing Position, Turn to Look Behind Over each Shoulder: Looks behind one side only/other side shows less weight shift Turn 360 Degrees: Able to turn 360 degrees safely one side only in 4 seconds or less Standing Unsupported, Alternately Place Feet on Step/Stool: Able to stand independently and complete 8 steps >20 seconds Standing Unsupported, One Foot in Front: Able to plae foot ahead of the other independently and hold 30 seconds Standing on One Leg: Able to lift leg independently and hold equal to or more than 3 seconds Total Score: 47   Exercise/Treatments Supine Bridges: Both;10 reps Straight Leg Raises: Both;10 reps Sidelying Hip ABduction: Both;10 reps Hip ADduction: Both;10 reps Prone  Hip Extension: Both;10 reps  Physical Therapy Assessment and Plan PT Assessment and Plan Clinical Impression Statement: Pt is a 57 year old female referred to PT for unsteady gait with impairments listed below.  Pt has significant findings of impaired hip strategy and berg balance test 47/56.  Pt may benefit from 18-74mmHg compression hose to decrease edema to BLE.  Pt will benefit from skilled therapeutic intervention in order to improve on the following deficits: Decreased balance;Decreased activity tolerance;Difficulty walking;Increased fascial restricitons Rehab Potential: Fair Clinical Impairments Affecting Rehab Potential: secondary to limiations with insurance PT Frequency: Min 1X/week PT Duration:  (3 weeks) PT Treatment/Interventions: Engineer, water;Therapeutic activities;Therapeutic exercise;Balance training;Patient/family education PT Plan: FOCUS ON HEP for balance, do not use aerobic equipment as it is not available to her. Continue with BLE strengtheing (squats, heel/toe raises, standing knee  flexion, stair training) and gait training activities (retro, tandem, heel and toe walking, walking with head turns)    Goals Home Exercise Program Pt will Perform Home Exercise Program: Independently PT Goal: Perform Home Exercise Program - Progress: Goal set today PT Short Term Goals Time to Complete Short Term Goals: 3 weeks PT Short Term Goal 1: Pt will improve her LE strength by 1 muscle grade in order to have greater ease with ambulation.   PT Short Term Goal 2: Pt will improve her proprioceptive awareness and demonstrate Rt and Lt SLS x15 sec on solid surface to decrease of falls.  PT Short Term Goal 3: Pt will improve her balance and score greater than a 50/56 on the berg balance test to decrease risk of falls.   Problem List Patient Active Problem List   Diagnosis Date Noted  . Unsteady gait 01/25/2013  . COPD (chronic obstructive pulmonary disease) with emphysema 12/26/2012  . DOE (dyspnea on exertion) 12/01/2012  . 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 Dysphagia 11/09/2012  . Influenza A 08/07/2011  . COPD exacerbation 08/07/2011  . Hypokalemia 08/07/2011  . Myositis 08/07/2011  . Dehydration 08/07/2011  . ARF (acute renal failure) 08/07/2011  . Tubular adenoma 07/20/2011  . INSOMNIA 02/26/2008  . CERUMEN IMPACTION, RIGHT 01/23/2008  . 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 NECK PAIN 11/22/2007  . HAMMER TOE 11/22/2007  . ANXIETY 01/30/2007  . TOBACCO ABUSE 01/30/2007  . DEPRESSION 01/30/2007  . GERD 01/30/2007  .  PROLAPSE, VAGINAL WALL, CYSTOCELE, MIDLINE 01/10/2007  . INCONTINENCE 01/10/2007  . SEBACEOUS CYST 08/17/2006  . ADENOCARCINOMA, BREAST, RIGHT 12/01/2003    General Behavior During Therapy: [facility] for tasks assessed/performed Cognition: [facility] for tasks performed PT Plan of Care PT Home Exercise Plan: see scanned report PT Patient Instructions: Teach back for: foot care for periphreal neurpathy, compression  stockings, HEP.  Discussed finding support groups for anxiety and loss of child, POC and answered questions regarding diagnosis and importance of training for strength and balance to improve balance.  Consulted and Agree with Plan of Care: Patient  GP    Annett Fabian, MPT, ATC 01/25/2013, 9:28 AM  Physician Documentation Your signature is required to indicate approval of the treatment plan as stated above.  Please sign and either send electronically or make a copy of this report for your files and return this physician signed original.   Please mark one 1.__approve of plan  2. ___approve of plan with the following conditions.   ______________________________                                                          _____________________ Physician Signature                                                                                                             Date  INITIAL EVALUATION  Physical Therapy     Patient Name: ERSIE SAVINO Date Of Birth: July 29, 1956  Guardian Name: N/A Treatment ICD-9 Code: 7812  Address: [address] Date of Evaluation: 12/25/2012  [place], [place] [address] Requested Dates of Service: 01/25/2013 - 02/15/2013       Therapy History: No known therapy for this problem  Reason For Referral: Recipient has a new injury, disease or condition  Prior Level of Function: Independent/Modified Independent with all ADLs (OT/PT) or Audition, Communication, Voice and/or Swallowing Skills (ST/AUD)  Additional Medical History: Symptoms/Limitations Symptoms: PMH: Periphreal neuropathy, fibromyalgia, hx of breast cancer Pertinent History: Pt is referred to PT for unsteady gait. Her c/co is difficulty walking because her legs get a "real funny feeling", she doesnt feel that her legs are weak and reports increased falls. Patient Stated Goals: Pt reports she doesnt want to feel unsteady anymore, is unsure if PT will work Pain Assessment Currently in Pain?: Yes Pain Score: 4 (FACES) Pain  Location: Foot Pain Orientation: Right;Left Pain Type: Neuropathic pain Pain Radiating Towards: BLE Pain Onset: More than a month ago Pain Relieving Factors: unable to find anything to help with pain Effect of Pain on Daily Activities: difficulty walking Precautions/Restrictions Precautions Precautions: Fall Precaution Comments: hx of cancer Balance Screening Balance Screen Has the patient fallen in the past 6 months: Yes How many times?: 6 Has the patient had a decrease in activity level because of a fear of falling? : No Is the patient reluctant to  leave their home because of a fear of falling? : No Prior Functioning Home Living Type of Home: Mobile home Prior Function Vocation: Unemployed Comments: enjoys taking her grandson to the park. Cognition/Observation Observation/Other Assessments Observations: mild swelling to her BLE Sensation/Coordination/Flexibility/Functional Tests Functional Tests Functional Tests: Activity specific balance confidence scale (ABC): 20%  Prematurity: N/A  Severity Level: N/A       Treatment Goals:  1. Goal: Pt will Perform Home Exercise Program: Independently  Baseline: Given  Duration: 3 Week(s)  2. Goal: PT Short Term Goal 1: Pt will improve her LE strength by 1 muscle grade in order to have greater ease with ambulation.  Baseline: RLE Strength Right Hip Flexion: 4/5 Right Hip ABduction: 3+/5 Right Hip ADduction: 3/5 Right Knee Flexion: 5/5 Right Knee Extension: 5/5 LLE Strength Left Hip Flexion: 4/5 Left Hip ABduction: 3/5 Left Hip ADduction: 3/5 Left Knee Flexion: 5/5 Left Knee Extension: 5/5 Ambulation/Gait: Yes Assistive device: Straight cane  Duration: 3 Week(s)  3. Goal: Pt will improve her proprioceptive awareness and demonstrate Rt and Lt SLS x15 sec on solid surface to decrease of falls  Baseline: Static Standing Balance Single Leg Stance - Right Leg: 3 Single Leg Stance - Left Leg: 3 Tandem Stance - Right Leg: 0 Tandem Stance - Left Leg: 0 Rhomberg - Eyes  Opened: 60 Rhomberg - Eyes Closed: 10  Duration: 3 Week(s)  Goal: Pt will improve her balance and score greater than a 50/56 on the berg balance test to decrease risk of falls.  Baseline: Berg Score: 47/56  Duration: 3 Week(s)         Treatment Frequency/Duration:  1x/week for 3 weeks  Units per visit: N/A    Additional Information: PT Assessment and Plan Clinical Impression Statement: Pt is a 57 year old female referred to PT for unsteady gait with impairments listed below. Pt has significant findings of impaired hip strategy and berg balance test 47/56. Pt may benefit from 18-34mmHg compression hose to decrease edema to BLE. Pt will benefit from skilled therapeutic intervention in order to improve on the following deficits: Decreased balance;Decreased activity tolerance;Difficulty walking;Increased fascial restricitons Rehab Potential: Fair Clinical Impairments Affecting Rehab Potential: secondary to limiations with insurance PT Frequency: Min 1X/week PT Duration: (3 weeks) PT Treatment/Interventions: Engineer, water;Therapeutic activities;Therapeutic exercise;Balance training;Patient/family education PT Home Exercise Plan: see scanned report PT Patient Instructions: Teach back for: foot care for periphreal neurpathy, compression stockings, HEP. Discussed finding support groups for anxiety and loss of child, POC and answered questions regarding diagnosis and importance of training for strength and balance to improve balance.   Annett Fabian, MPT, [place] 01/25/13      Therapist Signature  Date Physician Signature  Date    Annett Fabian       Therapist Name  Physician Name   Refer to the Review Status page for current case status

## 2013-01-30 ENCOUNTER — Ambulatory Visit (HOSPITAL_COMMUNITY)
Admission: RE | Admit: 2013-01-30 | Discharge: 2013-01-30 | Disposition: A | Discharge status: Home / Self Care | Payer: Medicaid Other | Source: Ambulatory Visit | Attending: Neurology | Admitting: Neurology

## 2013-01-30 ENCOUNTER — Telehealth (HOSPITAL_COMMUNITY): Payer: Self-pay

## 2013-01-30 NOTE — Progress Notes (Signed)
Physical Therapy Discharge Patient Details  Name: Sydney Blair MRN: [id number] Date of Birth: 1956-07-07  Today's Date: 01/30/2013 Time: 1200-1205 PT Time Calculation (min): 5 min  Physical Therapy Assessment and Plan PT Assessment and Plan Clinical Impression Statement: Pt came in today and explained to pt that her diagnosis is not covered by Medicaid, they have not denied her as of today, however with current Medicaid changes, her diagnosis was not covered.  Updated her HEP with LE strengthening and balance activities she can safely continue at home.      Problem List Patient Active Problem List   Diagnosis Date Noted  . Unsteady gait 01/25/2013  . COPD (chronic obstructive pulmonary disease) with emphysema 12/26/2012  . DOE (dyspnea on exertion) 12/01/2012  . 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 Dysphagia 11/09/2012  . Influenza A 08/07/2011  . COPD exacerbation 08/07/2011  . Hypokalemia 08/07/2011  . Myositis 08/07/2011  . Dehydration 08/07/2011  . ARF (acute renal failure) 08/07/2011  . Tubular adenoma 07/20/2011  . INSOMNIA 02/26/2008  . CERUMEN IMPACTION, RIGHT 01/23/2008  . 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 NECK PAIN 11/22/2007  . HAMMER TOE 11/22/2007  . ANXIETY 01/30/2007  . TOBACCO ABUSE 01/30/2007  . DEPRESSION 01/30/2007  . GERD 01/30/2007  . PROLAPSE, VAGINAL WALL, CYSTOCELE, MIDLINE 01/10/2007  . INCONTINENCE 01/10/2007  . SEBACEOUS CYST 08/17/2006  . ADENOCARCINOMA, BREAST, RIGHT 12/01/2003    PT Plan of Care PT Home Exercise Plan: see updated report Consulted and Agree with Plan of Care: Patient  GP    Harlow Basley, MPT, ATC 01/30/2013, 12:02 PM

## 2013-02-08 ENCOUNTER — Ambulatory Visit (HOSPITAL_COMMUNITY): Payer: Medicaid Other | Admitting: Physical Therapy

## 2013-02-15 ENCOUNTER — Ambulatory Visit (HOSPITAL_COMMUNITY): Payer: Medicaid Other | Admitting: Physical Therapy

## 2013-03-05 ENCOUNTER — Ambulatory Visit (INDEPENDENT_AMBULATORY_CARE_PROVIDER_SITE_OTHER): Payer: Medicaid Other | Admitting: Gastroenterology

## 2013-03-05 ENCOUNTER — Encounter: Payer: Self-pay | Admitting: Gastroenterology

## 2013-03-05 VITALS — BP 111/64 | HR 97 | Temp 97.2°F | Ht 61.0 in | Wt 210.0 lb

## 2013-03-05 DIAGNOSIS — R1310 Dysphagia, unspecified: Secondary | ICD-10-CM

## 2013-03-05 NOTE — Patient Instructions (Addendum)
1. I will discuss your swallowing issues with Dr. Jena Gauss. Further recommendations to follow. 2. In the meantime, please to your food thoroughly. Have plenty of liquids available to use when you swallow food or pills.  3. Please check with your pharmacy, if they're any pills that you can crush and take in applesauce please do so.

## 2013-03-05 NOTE — Progress Notes (Signed)
Primary Care Physician:  Dorrene German, MD  Primary Gastroenterologist:  Roetta Sessions, MD   Chief Complaint  Patient presents with  . Dysphagia    HPI:  Sydney Blair is a 57 y.o. female here for followup of dysphagia. EGD in March 2012 Dr. Jena Gauss showed small hiatal hernia, normal esophagus. 56 French Maloney dilator was passed due to history of dysphagia. Patient reported that this did not help her symptoms. She had a barium pill esophagram in April 2014. She had laryngeal penetration but no aspiration noted. Age-related esophageal dysmotility. Noted to have a wet sounding cough after the witnessed penetration. We advised that she have speech therapy evaluation.  The speech pathologist note stated there was no evidence of aspiration or oropharyngeal dysphagia but advised objective study via modified barium swallow study if patient continued to have difficulty swallowing.. She's had no weight loss.  Worsening problems taking pills. Takes one at a time. Coughs pills back up frequently. Pills are dissolving in throat area. Really bad for past few weeks. With food, three hours after meals, feels the food come up in esophagus. Belch and the food just spontaneously comes back up. No heartburn on omeprazole BID. Does not eat and lay down. Liquids go down ok. BM regular. No melena, brbpr. No abdominal pain.  Current Outpatient Prescriptions  Medication Sig Dispense Refill  . budesonide-formoterol (SYMBICORT) 160-4.5 MCG/ACT inhaler Inhale 1 puff into the lungs 2 (two) times daily.      . cyclobenzaprine (FLEXERIL) 10 MG tablet Take 10 mg by mouth 3 (three) times daily as needed for muscle spasms.      . DULoxetine (CYMBALTA) 60 MG capsule Take 60 mg by mouth daily. Take with food      . FLUoxetine (PROZAC) 40 MG capsule Take 40 mg by mouth daily.      . fluticasone (FLONASE) 50 MCG/ACT nasal spray Place 2 sprays into the nose daily.      Marland Kitchen HYDROcodone-acetaminophen (NORCO/VICODIN) 5-325 MG per  tablet Take 1 tablet by mouth every 6 (six) hours as needed for pain.      Marland Kitchen levothyroxine (SYNTHROID, LEVOTHROID) 100 MCG tablet Take 100 mcg by mouth daily.      . meclizine (ANTIVERT) 12.5 MG tablet Take 12.5 mg by mouth 3 (three) times daily as needed.      . nitroGLYCERIN (NITROSTAT) 0.4 MG SL tablet Place 0.4 mg under the tongue every 5 (five) minutes as needed for chest pain.      Marland Kitchen omeprazole (PRILOSEC) 20 MG capsule Take 20 mg by mouth 2 (two) times daily.       . pravastatin (PRAVACHOL) 20 MG tablet Take 40 mg by mouth daily.       . pregabalin (LYRICA) 100 MG capsule Take 300 mg by mouth 2 (two) times daily.       Marland Kitchen spironolactone (ALDACTONE) 50 MG tablet Take 50 mg by mouth daily.      Marland Kitchen tiotropium (SPIRIVA HANDIHALER) 18 MCG inhalation capsule Place 1 capsule (18 mcg total) into inhaler and inhale daily.  30 capsule  3   No current facility-administered medications for this visit.    Allergies as of 03/05/2013 - Review Complete 03/05/2013  Allergen Reaction Noted  . Penicillins Anaphylaxis     Past Medical History  Diagnosis Date  . Cancer 2005    breast  . Incontinence   . Sebaceous cyst     lt labial location  . Tobacco abuse   . GERD (gastroesophageal reflux disease)   .  Depression with anxiety   . COPD (chronic obstructive pulmonary disease)   . ARF (acute renal failure) 08/07/2011  . Fibromyalgia   . Peripheral neuropathy     Past Surgical History  Procedure Laterality Date  . Breast lumpectomy      right with node dissection  . Esophagogastroduodenoscopy  11/11/2010    YQM:VHQIO hiatal hernia, 56 French dilator  . Colonoscopy  11/11/2010    NGE:XBMWUXLKGM rectal polyp and splenic flexure otherwise normal; pathology showed  tubular adenomas. Next TCS 10/2015.  . Bladder tack    . Mastectomy      partial    Family History  Problem Relation Age of Onset  . Cancer Other   . Heart attack Other   . Lung disease Other     History   Social History  .  Marital Status: Married    Spouse Name: N/A    Number of Children: 1  . Years of Education: N/A   Occupational History  . disabled    Social History Main Topics  . Smoking status: Current Every Day Smoker -- 1.00 packs/day for 35 years    Types: Cigarettes  . Smokeless tobacco: Not on file     Comment: STILL SMOKING-ONLY 2-3 CIGS DAILY...started at age 50  . Alcohol Use: No  . Drug Use: No  . Sexually Active: Yes    Birth Control/ Protection: Post-menopausal     Comment: chemo stopped her periods   Other Topics Concern  . Not on file   Social History Narrative   Lives w. Husband   Lost 1 son-?etiology      ROS:  General: Negative for anorexia, weight loss, fever, chills, fatigue, weakness. Eyes: Negative for vision changes.  ENT: Negative for hoarseness,  nasal congestion.see hpi CV: Negative for chest pain, angina, palpitations. + dyspnea on exertion, peripheral edema.  Respiratory: Negative for dyspnea at rest,  cough, sputum, wheezing. +DOE GI: See history of present illness. GU:  Negative for dysuria, hematuria, urinary incontinence, urinary frequency, nocturnal urination.  MS: Negative for joint pain, low back pain.  Derm: Negative for rash or itching.  Neuro: Negative for weakness, abnormal sensation, seizure, frequent headaches, memory loss, confusion.  Psych: Negative for anxiety, depression, suicidal ideation, hallucinations.  Endo: Negative for unusual weight change.  Heme: Negative for bruising or bleeding. Allergy: Negative for rash or hives.    Physical Examination:  BP 111/64  Pulse 97  Temp(Src) 97.2 F (36.2 C) (Oral)  Ht 5\' 1"  (1.549 m)  Wt 210 lb (95.255 kg)  BMI 39.7 kg/m2   General: Well-nourished, well-developed in no acute distress.  Head: Normocephalic, atraumatic.   Eyes: Conjunctiva pink, no icterus. Mouth: Oropharyngeal mucosa moist and pink , no lesions erythema or exudate. Neck: Supple without thyromegaly, masses, or  lymphadenopathy.  Lungs: Clear to auscultation bilaterally.  Heart: Regular rate and rhythm, no murmurs rubs or gallops.  Abdomen: Bowel sounds are normal, nontender, nondistended, no hepatosplenomegaly or masses, no abdominal bruits or    hernia , no rebound or guarding.   Rectal: not performed Extremities: Trace lower extremity edema. No clubbing or deformities.  Neuro: Alert and oriented x 4 , grossly normal neurologically.  Skin: Warm and dry, no rash or jaundice.   Psych: Alert and cooperative, normal mood and affect.

## 2013-03-05 NOTE — Assessment & Plan Note (Signed)
Patient continues to have difficulty swallowing pills, worsened over the last few weeks. Notes that the pills are dissolving in the back or throat area and eventually she's ever washed them down. "Coughs up" food at times, can be hours after a meal. Food goes down okay for the most part. No problems with liquids. Previous EGD/ED in 2012 did not help. BPE/ST eval as above.  To discuss with Dr. Jena Gauss. Consider MBSS v. Neck CT v. Esophageal manometry.

## 2013-03-05 NOTE — Progress Notes (Signed)
Cc PCP 

## 2013-03-06 NOTE — Progress Notes (Signed)
Please let patient know, I discussed her swallowing issues with Dr. Jena Gauss.  He advises we pursue modified barium swallowing study (diagnosis difficulty swallowing, advised by speech therapist) as next step. Please arrange.

## 2013-03-07 ENCOUNTER — Other Ambulatory Visit: Payer: Self-pay | Admitting: Gastroenterology

## 2013-03-07 DIAGNOSIS — R1310 Dysphagia, unspecified: Secondary | ICD-10-CM

## 2013-03-07 NOTE — Progress Notes (Signed)
Referral has been sent to [facility] Speech Pathologist and Angelica Chessman will contact the patient with date & time

## 2013-03-08 ENCOUNTER — Other Ambulatory Visit: Payer: Self-pay | Admitting: Gastroenterology

## 2013-03-08 NOTE — Progress Notes (Signed)
Pt is aware that Speech will be contacting her to set up Therapy.

## 2013-03-15 ENCOUNTER — Other Ambulatory Visit: Payer: Self-pay | Admitting: Internal Medicine

## 2013-03-15 DIAGNOSIS — R1310 Dysphagia, unspecified: Secondary | ICD-10-CM

## 2013-03-16 ENCOUNTER — Encounter (HOSPITAL_COMMUNITY): Payer: Self-pay | Admitting: *Deleted

## 2013-03-16 ENCOUNTER — Emergency Department (HOSPITAL_COMMUNITY)
Admission: EM | Admit: 2013-03-16 | Discharge: 2013-03-16 | Discharge status: Against Medical Advice | Payer: Medicaid Other | Attending: Emergency Medicine | Admitting: Emergency Medicine

## 2013-03-16 DIAGNOSIS — R52 Pain, unspecified: Secondary | ICD-10-CM | POA: Insufficient documentation

## 2013-03-16 DIAGNOSIS — M79609 Pain in unspecified limb: Secondary | ICD-10-CM | POA: Insufficient documentation

## 2013-03-16 DIAGNOSIS — M542 Cervicalgia: Secondary | ICD-10-CM | POA: Insufficient documentation

## 2013-03-16 DIAGNOSIS — J449 Chronic obstructive pulmonary disease, unspecified: Secondary | ICD-10-CM | POA: Insufficient documentation

## 2013-03-16 DIAGNOSIS — F17200 Nicotine dependence, unspecified, uncomplicated: Secondary | ICD-10-CM | POA: Insufficient documentation

## 2013-03-16 DIAGNOSIS — J4489 Other specified chronic obstructive pulmonary disease: Secondary | ICD-10-CM | POA: Insufficient documentation

## 2013-03-16 NOTE — ED Notes (Signed)
Pt c/o pain in her bones, states her feet hurt, arms hurt, and neck hurts. Pt states this has been going on for about a year but has gotten worse today.

## 2013-03-27 ENCOUNTER — Encounter (HOSPITAL_COMMUNITY): Payer: Medicaid Other | Admitting: Speech Pathology

## 2013-03-27 ENCOUNTER — Other Ambulatory Visit (HOSPITAL_COMMUNITY): Payer: Medicaid Other

## 2013-04-04 ENCOUNTER — Other Ambulatory Visit (HOSPITAL_COMMUNITY): Payer: Self-pay | Admitting: Anesthesiology

## 2013-04-04 DIAGNOSIS — M5432 Sciatica, left side: Secondary | ICD-10-CM

## 2013-04-04 DIAGNOSIS — G608 Other hereditary and idiopathic neuropathies: Secondary | ICD-10-CM

## 2013-04-04 DIAGNOSIS — M545 Low back pain: Secondary | ICD-10-CM

## 2013-04-09 ENCOUNTER — Ambulatory Visit (HOSPITAL_COMMUNITY)
Admission: RE | Admit: 2013-04-09 | Discharge: 2013-04-09 | Disposition: A | Discharge status: Home / Self Care | Payer: Medicaid Other | Source: Ambulatory Visit | Attending: Internal Medicine | Admitting: Internal Medicine

## 2013-04-09 ENCOUNTER — Other Ambulatory Visit: Payer: Self-pay | Admitting: Internal Medicine

## 2013-04-09 ENCOUNTER — Ambulatory Visit (HOSPITAL_COMMUNITY)
Admission: RE | Admit: 2013-04-09 | Discharge: 2013-04-09 | Disposition: A | Discharge status: Home / Self Care | Payer: Medicaid Other | Source: Ambulatory Visit | Attending: Anesthesiology | Admitting: Anesthesiology

## 2013-04-09 DIAGNOSIS — R1310 Dysphagia, unspecified: Secondary | ICD-10-CM | POA: Insufficient documentation

## 2013-04-09 DIAGNOSIS — Q762 Congenital spondylolisthesis: Secondary | ICD-10-CM | POA: Insufficient documentation

## 2013-04-09 DIAGNOSIS — M47817 Spondylosis without myelopathy or radiculopathy, lumbosacral region: Secondary | ICD-10-CM | POA: Insufficient documentation

## 2013-04-09 DIAGNOSIS — G608 Other hereditary and idiopathic neuropathies: Secondary | ICD-10-CM

## 2013-04-09 DIAGNOSIS — M545 Low back pain, unspecified: Secondary | ICD-10-CM | POA: Insufficient documentation

## 2013-04-09 DIAGNOSIS — IMO0001 Reserved for inherently not codable concepts without codable children: Secondary | ICD-10-CM | POA: Insufficient documentation

## 2013-04-09 DIAGNOSIS — M5432 Sciatica, left side: Secondary | ICD-10-CM

## 2013-04-09 DIAGNOSIS — M5430 Sciatica, unspecified side: Secondary | ICD-10-CM | POA: Insufficient documentation

## 2013-04-09 NOTE — Procedures (Addendum)
Objective Swallowing Evaluation: Modified Barium Swallowing Study   Patient Details  Name: Sydney Blair MRN: [id number] Date of Birth: 04/19/1956  Today's Date: 04/09/2013 Time:  - 10:30AM    Past Medical History:  Past Medical History  Diagnosis Date  . Cancer 2005    breast  . Incontinence   . Sebaceous cyst     lt labial location  . Tobacco abuse   . GERD (gastroesophageal reflux disease)   . Depression with anxiety   . COPD (chronic obstructive pulmonary disease)   . ARF (acute renal failure) 08/07/2011  . Fibromyalgia   . Peripheral neuropathy    Past Surgical History:  Past Surgical History  Procedure Laterality Date  . Breast lumpectomy      right with node dissection  . Esophagogastroduodenoscopy  11/11/2010    WUJ:WJXBJ hiatal hernia, 56 French dilator  . Colonoscopy  11/11/2010    YNW:[id number] rectal polyp and splenic flexure otherwise normal; pathology showed  tubular adenomas. Next TCS 10/2015.  . Bladder tack    . Mastectomy      partial   HPI:  Sydney Blair First is a 57 yo female who was referred by Dr. Benard Rink for MBSS due to pt with continued complaints of difficulty swallowing. Ms. Moilanen was seen in my office in May for clinical swallow evaluation.She had BPE in April which showed penetration without aspiration age-related esophageal dysmotility, and question of small hiatal hernia.   Symptoms/Limitations Symptoms: Pt continues to complain of difficulty swallowing pills and some solids. Special Tests: mbss  Recommendation/Prognosis  Clinical Impression Dysphagia Diagnosis: Mild oral phase dysphagia;Mild pharyngeal phase dysphagia;Suspected primary esophageal dysphagia  Clinical impression: Mild oropharyngeal phase dysphagia characterized by delayed oral transit with solids (pt has dentures) and difficulty with anterior posterior transit of barium tablet to between faucial pillars, but once there she swallowed without issue. Pt with consistent  premature spillage with thins and nectars to the pyriforms and delay in swallow initiiation with consistent penetration of liquids (flash without aspiration). Delayed emptying of esophagus noted especially with solids. Significant delay in passing of barium tablet near LES/? small hiatal hernia. The pill passed after several minutes, but did collect food behind it which fillled 2/3 of esophagus. Pt expressed discomfort and pointed to laryngeal area, however no residuals were seen there- perhaps referred symptoms from esophagus. Pt may benefit from repeat EGD (last done in 2012), however she reports that it didn't seem to help that much.   Pt does have delay in swallow initiation and penetration, but therapy is unlikely to change this. It would not surprise me if pt does occasionally aspirate when taking large sips, as she reports strong coughing episodes while eating/drinking at times (not witness with SLP today or when seen in May). Pt was encouraged to take small sips and chew food thoroughly (especially meats and breads) to facilitate better movement through esophagus. She may do better to take her pills whole with puree (she reports that it is cumbersome to take with thin liquids) since she seemed to have difficulty transferring pill over base of tongue. No further SLP recommendations can be made. All recommendations were reviewed extensively with pt.   Swallow Evaluation Recommendations Recommended Consults: Consider esophageal assessment (Consider EGD with dilation, however pt reports min relief) Diet Recommendations: Dysphagia 3 (Mechanical Soft);Thin liquid Liquid Administration via: Cup Medication Administration: Whole meds with liquid Supervision: Patient able to self feed Compensations: Small sips/bites;Follow solids with liquid Postural Changes and/or Swallow Maneuvers: Seated upright 90 degrees;Upright  30-60 min after meal;Out of bed for meals Oral Care Recommendations: Oral care BID Other  Recommendations: Clarify dietary restrictions Follow up Recommendations: None Prognosis Prognosis for Safe Diet Advancement: Good Individuals Consulted Consulted and Agree with Results and Recommendations: Patient (SLP spoke at length with pt) Report Sent to : Referring physician   General:  Date of Onset: 11/21/12 HPI: Sydney Blair is a 57 yo female who was referred by Dr. Benard Rink for MBSS due to pt with continued complaints of difficulty swallowing. Ms. Mcdougald was seen in my office in May for clinical swallow evaluation.She had BPE in April which showed penetration without aspiration age-related esophageal dysmotility, and question of small hiatal hernia.  Type of Study: Modified Barium Swallowing Study Reason for Referral: Objectively evaluate swallowing function Previous Swallow Assessment: Clinical: Pt shows no overt signs or symptoms of aspiration during clinical exam, although she complains of intermittent dysphagia with solids and liquids. BPE was positive for age related esophageal dysmotility and one episode of penetration to the vocal folds without witnessed aspiration with thin barium. Pt does present with hoarse vocal quality that she says is not painful and started after being diagnosed with fibromyalgia about 6-7 months ago. She states that she has a hard time swallowing liquids and pills. Pt does take a PPI twice daily. Pt was advised to take small sips of liquid and chew food thoroughly. If she continues to have difficulty with swallowing, an objective study via MBSS would provide more information and should be ordered. Pt continues to smoke which likely contributes to hoarseness and coughing. No further SLP services indicated at this time, unless physician desires MBSS. She goes for PFT tomorrow in [place]. Diet Prior to this Study: Regular;Thin liquids Temperature Spikes Noted: No Respiratory Status: Room air History of Recent Intubation: No Behavior/Cognition:  Alert;Cooperative Oral Cavity - Dentition: Dentures, top;Dentures, bottom Oral Motor / Sensory Function: Within functional limits Self-Feeding Abilities: Able to feed self Patient Positioning: Upright in chair Baseline Vocal Quality: Clear;Hoarse (pt smokes TOB) Volitional Cough: Strong Volitional Swallow: Able to elicit Anatomy: Within functional limits Pharyngeal Secretions: Not observed secondary MBS  Reason for Referral:  Objectively evaluate swallowing function   Oral Phase Oral Preparation/Oral Phase Oral Phase: Impaired Oral - Solids Oral - Pill: Reduced posterior propulsion;Delayed oral transit Oral Phase - Comment Oral Phase - Comment: Mild prolonged oral transit with solids  Pharyngeal Phase  Pharyngeal Phase Pharyngeal Phase: Impaired Pharyngeal - Nectar Pharyngeal - Nectar Cup: Premature spillage to valleculae;Delayed swallow initiation;Premature spillage to pyriform sinuses;Reduced airway/laryngeal closure;Penetration/Aspiration during swallow;Lateral channel residue Penetration/Aspiration details (nectar cup): Material does not enter airway;Material enters airway, remains ABOVE vocal cords then ejected out Pharyngeal - Thin Pharyngeal - Thin Cup: Delayed swallow initiation;Premature spillage to valleculae;Premature spillage to pyriform sinuses;Reduced airway/laryngeal closure;Lateral channel residue;Penetration/Aspiration during swallow Penetration/Aspiration details (thin cup): Material does not enter airway;Material enters airway, remains ABOVE vocal cords then ejected out Pharyngeal - Thin Straw: Delayed swallow initiation;Premature spillage to valleculae;Premature spillage to pyriform sinuses;Reduced airway/laryngeal closure;Lateral channel residue;Penetration/Aspiration during swallow Penetration/Aspiration details (thin straw): Material enters airway, remains ABOVE vocal cords then ejected out;Material does not enter airway Pharyngeal - Solids Pharyngeal - Puree:  Within functional limits Pharyngeal - Regular: Within functional limits Pharyngeal - Pill:  (Pt had difficulty propelling posteriorly in oral cavity) Pharyngeal Phase - Comment Pharyngeal Comment: consistent premature spillage to pyriforms, delay in swallow initiation, with penetration of liquids (without witnessed aspiration) despite chin tuck  Cervical Esophageal Phase  Cervical Esophageal Phase Cervical Esophageal Phase:  Impaired Cervical Esophageal Phase - Comment Cervical Esophageal Comment: Stasis of barium tablet in distal esophagus for several minutes- additional po stacked up behind it and pt expressed discomfort, but pill eventually passed throught to stomach.   Thank you,  Havery Moros, CCC-SLP [phone]  Kamon Fahr 04/09/2013, 5:19 PM

## 2013-04-10 ENCOUNTER — Other Ambulatory Visit: Payer: Self-pay | Admitting: Pulmonary Disease

## 2013-04-12 NOTE — Progress Notes (Signed)
Pt's appt. Is 06/04/13.

## 2013-05-02 ENCOUNTER — Emergency Department (HOSPITAL_COMMUNITY): Payer: Medicaid Other

## 2013-05-02 ENCOUNTER — Encounter (HOSPITAL_COMMUNITY): Payer: Self-pay | Admitting: Family Medicine

## 2013-05-02 ENCOUNTER — Emergency Department (HOSPITAL_COMMUNITY)
Admission: EM | Admit: 2013-05-02 | Discharge: 2013-05-02 | Disposition: A | Discharge status: Home / Self Care | Payer: Medicaid Other | Attending: Emergency Medicine | Admitting: Emergency Medicine

## 2013-05-02 DIAGNOSIS — S93409A Sprain of unspecified ligament of unspecified ankle, initial encounter: Secondary | ICD-10-CM | POA: Insufficient documentation

## 2013-05-02 DIAGNOSIS — IMO0002 Reserved for concepts with insufficient information to code with codable children: Secondary | ICD-10-CM | POA: Insufficient documentation

## 2013-05-02 DIAGNOSIS — Z853 Personal history of malignant neoplasm of breast: Secondary | ICD-10-CM | POA: Insufficient documentation

## 2013-05-02 DIAGNOSIS — Z79899 Other long term (current) drug therapy: Secondary | ICD-10-CM | POA: Insufficient documentation

## 2013-05-02 DIAGNOSIS — J4489 Other specified chronic obstructive pulmonary disease: Secondary | ICD-10-CM | POA: Insufficient documentation

## 2013-05-02 DIAGNOSIS — Z880 Allergy status to penicillin: Secondary | ICD-10-CM | POA: Insufficient documentation

## 2013-05-02 DIAGNOSIS — Y929 Unspecified place or not applicable: Secondary | ICD-10-CM | POA: Insufficient documentation

## 2013-05-02 DIAGNOSIS — J449 Chronic obstructive pulmonary disease, unspecified: Secondary | ICD-10-CM | POA: Insufficient documentation

## 2013-05-02 DIAGNOSIS — Y9301 Activity, walking, marching and hiking: Secondary | ICD-10-CM | POA: Insufficient documentation

## 2013-05-02 DIAGNOSIS — G609 Hereditary and idiopathic neuropathy, unspecified: Secondary | ICD-10-CM | POA: Insufficient documentation

## 2013-05-02 DIAGNOSIS — X500XXA Overexertion from strenuous movement or load, initial encounter: Secondary | ICD-10-CM | POA: Insufficient documentation

## 2013-05-02 DIAGNOSIS — F341 Dysthymic disorder: Secondary | ICD-10-CM | POA: Insufficient documentation

## 2013-05-02 DIAGNOSIS — IMO0001 Reserved for inherently not codable concepts without codable children: Secondary | ICD-10-CM | POA: Insufficient documentation

## 2013-05-02 DIAGNOSIS — K219 Gastro-esophageal reflux disease without esophagitis: Secondary | ICD-10-CM | POA: Insufficient documentation

## 2013-05-02 DIAGNOSIS — F17200 Nicotine dependence, unspecified, uncomplicated: Secondary | ICD-10-CM | POA: Insufficient documentation

## 2013-05-02 DIAGNOSIS — S93401A Sprain of unspecified ligament of right ankle, initial encounter: Secondary | ICD-10-CM

## 2013-05-02 MED ORDER — NAPROXEN 250 MG PO TABS
500.0000 mg | ORAL_TABLET | Freq: Once | ORAL | Status: AC
  Administered 2013-05-02: 500 mg via ORAL
  Filled 2013-05-02: qty 2

## 2013-05-02 NOTE — ED Notes (Signed)
Pt sts she was walking earlier and felt something pop in her right foot. sts now severe pain.

## 2013-05-02 NOTE — ED Provider Notes (Signed)
Medical screening examination/treatment/procedure(s) were performed by non-physician practitioner and as supervising physician I was immediately available for consultation/collaboration.  Anaeli Cornwall L Markeshia Giebel, MD 05/02/13 1624 

## 2013-05-02 NOTE — ED Notes (Signed)
Patient presents to ed c/o pain in her right ankle states she was walking and heard something pop in her ankle. Denies twisting or turning ankle. Positive pedal pulse

## 2013-05-02 NOTE — ED Provider Notes (Signed)
CSN: [id number]     Arrival date & time 05/02/13  0857 History   First MD Initiated Contact with Patient 05/02/13 [phone]     Chief Complaint  Patient presents with  . Foot Pain   (Consider location/radiation/quality/duration/timing/severity/associated sxs/prior Treatment) HPI Comments: 57 year old female presents to the emergency department complaining of right foot and ankle pain beginning earlier this morning. Patient states she was walking and felt something "pop" in her right foot and has had severe pain since. Pain described as sharp and throbbing, nonradiating rated 10 out of 10. She has not had any alleviating factors for her pain. Applying pressure makes the pain worse. She has not noticed any swelling at this time.  The history is provided by the patient.    Past Medical History  Diagnosis Date  . Cancer 2005    breast  . Incontinence   . Sebaceous cyst     lt labial location  . Tobacco abuse   . GERD (gastroesophageal reflux disease)   . Depression with anxiety   . COPD (chronic obstructive pulmonary disease)   . ARF (acute renal failure) 08/07/2011  . Fibromyalgia   . Peripheral neuropathy    Past Surgical History  Procedure Laterality Date  . Breast lumpectomy      right with node dissection  . Esophagogastroduodenoscopy  11/11/2010    JXB:JYNWG hiatal hernia, 56 French dilator  . Colonoscopy  11/11/2010    [id number] rectal polyp and splenic flexure otherwise normal; pathology showed  tubular adenomas. Next TCS 10/2015.  . Bladder tack    . Mastectomy      partial   Family History  Problem Relation Age of Onset  . Cancer Other   . Heart attack Other   . Lung disease Other    History  Substance Use Topics  . Smoking status: Current Every Day Smoker -- 1.00 packs/day for 35 years    Types: Cigarettes  . Smokeless tobacco: Not on file     Comment: STILL SMOKING-ONLY 2-3 CIGS DAILY...started at age 24  . Alcohol Use: No   OB History   Grav Para Term  Preterm Abortions TAB SAB Ect Mult Living                 Review of Systems  Musculoskeletal: Negative for joint swelling.       Positive for right ankle and foot pain.  All other systems reviewed and are negative.    Allergies  Penicillins  Home Medications   Current Outpatient Rx  Name  Route  Sig  Dispense  Refill  . budesonide-formoterol (SYMBICORT) 160-4.5 MCG/ACT inhaler   Inhalation   Inhale 1 puff into the lungs 2 (two) times daily.         . cyclobenzaprine (FLEXERIL) 10 MG tablet   Oral   Take 10 mg by mouth 3 (three) times daily as needed for muscle spasms.         . DULoxetine (CYMBALTA) 60 MG capsule   Oral   Take 60 mg by mouth daily. Take with food         . FLUoxetine (PROZAC) 40 MG capsule   Oral   Take 40 mg by mouth daily.         . fluticasone (FLONASE) 50 MCG/ACT nasal spray   Nasal   Place 2 sprays into the nose daily.         Marland Kitchen levothyroxine (SYNTHROID, LEVOTHROID) 100 MCG tablet   Oral   Take 100 mcg  by mouth daily.         . meclizine (ANTIVERT) 12.5 MG tablet   Oral   Take 12.5 mg by mouth 3 (three) times daily as needed for dizziness.          . nitroGLYCERIN (NITROSTAT) 0.4 MG SL tablet   Sublingual   Place 0.4 mg under the tongue every 5 (five) minutes as needed for chest pain.         Marland Kitchen omeprazole (PRILOSEC) 20 MG capsule   Oral   Take 20 mg by mouth 2 (two) times daily.          Marland Kitchen oxyCODONE (OXYCONTIN) 10 MG 12 hr tablet   Oral   Take 10 mg by mouth every 12 (twelve) hours.         . pravastatin (PRAVACHOL) 20 MG tablet   Oral   Take 40 mg by mouth daily.          . pregabalin (LYRICA) 100 MG capsule   Oral   Take 300 mg by mouth 2 (two) times daily.          Marland Kitchen spironolactone (ALDACTONE) 50 MG tablet   Oral   Take 50 mg by mouth daily.         Marland Kitchen tiotropium (SPIRIVA) 18 MCG inhalation capsule   Inhalation   Place 18 mcg into inhaler and inhale daily.          BP 120/60  Pulse 95   Temp(Src) 98.9 F (37.2 C)  Resp 18  SpO2 90% Physical Exam  Nursing note and vitals reviewed. Constitutional: She is oriented to person, place, and time. She appears well-developed and well-nourished. No distress.  HENT:  Head: Normocephalic and atraumatic.  Mouth/Throat: Oropharynx is clear and moist.  Eyes: Conjunctivae are normal.  Neck: Normal range of motion. Neck supple.  Cardiovascular: Normal rate, regular rhythm and normal heart sounds.   Pulses:      Dorsalis pedis pulses are 2+ on the right side.       Posterior tibial pulses are 2+ on the right side.  Capillary refill less than 3 seconds.  Pulmonary/Chest: Effort normal and breath sounds normal.  Musculoskeletal: She exhibits no edema.  Tender to palpation over AITFL and head of the fifth metatarsal. Mild overlying swelling laterally, no erythema or bruising. No deformity. Able to perform range of motion of ankle, slow and painful.  Neurological: She is alert and oriented to person, place, and time.  Sensation intact.  Skin: Skin is warm and dry. She is not diaphoretic.  Psychiatric: She has a normal mood and affect. Her behavior is normal.    ED Course  Procedures (including critical care time) Labs Review Labs Reviewed - No data to display Imaging Review No results found.  MDM   1. Ankle sprain, right, initial encounter    Patient with right ankle sprain. X-ray without any acute abnormality. Neurovascularly intact. Ace wrap and crutches given. Discussed RICE. Return precautions discussed. Patient states understanding of plan and is agreeable.    Trevor Mace, PA-C 05/02/13 1048

## 2013-06-04 ENCOUNTER — Telehealth: Payer: Self-pay | Admitting: Internal Medicine

## 2013-06-04 ENCOUNTER — Encounter: Payer: Self-pay | Admitting: Gastroenterology

## 2013-06-04 ENCOUNTER — Ambulatory Visit (INDEPENDENT_AMBULATORY_CARE_PROVIDER_SITE_OTHER): Payer: Medicaid Other | Admitting: Gastroenterology

## 2013-06-04 ENCOUNTER — Encounter (INDEPENDENT_AMBULATORY_CARE_PROVIDER_SITE_OTHER): Payer: Self-pay

## 2013-06-04 VITALS — BP 117/74 | HR 85 | Temp 97.9°F | Ht 62.0 in | Wt 207.4 lb

## 2013-06-04 DIAGNOSIS — R1310 Dysphagia, unspecified: Secondary | ICD-10-CM | POA: Insufficient documentation

## 2013-06-04 DIAGNOSIS — K5900 Constipation, unspecified: Secondary | ICD-10-CM

## 2013-06-04 MED ORDER — LINACLOTIDE 145 MCG PO CAPS: 145.0000 ug | ORAL_CAPSULE | Freq: Every day | ORAL | Status: DC

## 2013-06-04 NOTE — Assessment & Plan Note (Signed)
Recurrent pill and solid food dysphagia, with last EGD in 2012 with empiric dilation. Speech Pathology has also evaluated patient recently and felt she was dealing with primary esophageal dysphagia. BPE in April of this year with age-related dysmotility. Would recommend repeat EGD/ED in near future with Dr. Jena Gauss; risks and benefits were discussed in detail with reported understanding by patient. May ultimately need further evaluation at tertiary facility, ?manometry.   Continue PPI BID

## 2013-06-04 NOTE — Assessment & Plan Note (Signed)
Next colonoscopy 2017. Start Linzess 145 mcg daily.

## 2013-06-04 NOTE — Telephone Encounter (Signed)
Pt LMOM that she was returning LAW call. I called patient back to let her know that we are needing to [facility] her procedure and that LAW will be in touch in the morning. Pt understood.

## 2013-06-04 NOTE — Progress Notes (Signed)
 Referring Provider: Avbuere, Edwin A, MD Primary Care Physician:  AVBUERE,EDWIN A, MD Primary GI: Dr. Rourk   Chief Complaint  Patient presents with  . Follow-up  . Dysphagia    HPI:   Sydney Blair returns today to discuss a possible repeat upper endoscopy with dilation. Recently seen in our office due to recurrent pill dysphagia. EGD in March 2012 by Dr. Rourk showed small hiatal hernia, normal esophagus, s/p 56 F dilation empirically. Underwent MBSS with Speech Pathology who felt she had mild oral phase dysphagia, mild pharyngeal phase dysphagia, suspected primary esophageal dysphagia.   Pills get "hung up", +solid food dysphagia. No dysphagia with soft foods. Mild odynophagia. Hoarse all the time. No melena, hematochezia. Stays constipated. GERD controlled on BID PPI. No rectal bleeding.   Past Medical History  Diagnosis Date  . Cancer 2005    breast  . Incontinence   . Sebaceous cyst     lt labial location  . Tobacco abuse   . GERD (gastroesophageal reflux disease)   . Depression with anxiety   . COPD (chronic obstructive pulmonary disease)   . ARF (acute renal failure) 08/07/2011  . Fibromyalgia   . Peripheral neuropathy     Past Surgical History  Procedure Laterality Date  . Breast lumpectomy      right with node dissection  . Esophagogastroduodenoscopy  11/11/2010    RMR:small hiatal hernia, 56 French dilator  . Colonoscopy  11/11/2010    RMR:diminutive rectal polyp and splenic flexure otherwise normal; pathology showed  tubular adenomas. Next TCS 10/2015.  . Bladder tack    . Mastectomy      partial    Current Outpatient Prescriptions  Medication Sig Dispense Refill  . budesonide-formoterol (SYMBICORT) 160-4.5 MCG/ACT inhaler Inhale 1 puff into the lungs 2 (two) times daily.      . cyclobenzaprine (FLEXERIL) 10 MG tablet Take 10 mg by mouth 3 (three) times daily as needed for muscle spasms.      . DULoxetine (CYMBALTA) 60 MG capsule Take 60 mg by mouth  daily. Take with food      . FLUoxetine (PROZAC) 40 MG capsule Take 40 mg by mouth daily.      . fluticasone (FLONASE) 50 MCG/ACT nasal spray Place 2 sprays into the nose daily.      . levothyroxine (SYNTHROID, LEVOTHROID) 100 MCG tablet Take 100 mcg by mouth daily.      . meclizine (ANTIVERT) 12.5 MG tablet Take 12.5 mg by mouth 3 (three) times daily as needed for dizziness.       . nitroGLYCERIN (NITROSTAT) 0.4 MG SL tablet Place 0.4 mg under the tongue every 5 (five) minutes as needed for chest pain.      . omeprazole (PRILOSEC) 20 MG capsule Take 20 mg by mouth 2 (two) times daily.       . oxyCODONE (OXYCONTIN) 10 MG 12 hr tablet Take 10 mg by mouth every 12 (twelve) hours.      . pravastatin (PRAVACHOL) 20 MG tablet Take 40 mg by mouth daily.       . pregabalin (LYRICA) 100 MG capsule Take 300 mg by mouth 2 (two) times daily.       . spironolactone (ALDACTONE) 50 MG tablet Take 50 mg by mouth daily.      . tiotropium (SPIRIVA) 18 MCG inhalation capsule Place 18 mcg into inhaler and inhale daily.       No current facility-administered medications for this visit.    Allergies as   of 06/04/2013 - Review Complete 06/04/2013  Allergen Reaction Noted  . Penicillins Anaphylaxis     Family History  Problem Relation Age of Onset  . Cancer Other   . Heart attack Other   . Lung disease Other     History   Social History  . Marital Status: Married    Spouse Name: N/A    Number of Children: 1  . Years of Education: N/A   Occupational History  . disabled    Social History Main Topics  . Smoking status: Current Every Day Smoker -- 1.00 packs/day for 35 years    Types: Cigarettes  . Smokeless tobacco: None     Comment: STILL SMOKING-ONLY 2-3 CIGS DAILY...started at age 15  . Alcohol Use: No  . Drug Use: No  . Sexual Activity: Yes    Birth Control/ Protection: Post-menopausal     Comment: chemo stopped her periods   Other Topics Concern  . None   Social History Narrative    Lives w. Husband   Lost 1 son-?etiology    Review of Systems: Gen: Denies fever, chills, anorexia. Denies fatigue, weakness, weight loss.  CV: Denies chest pain, palpitations, syncope, peripheral edema, and claudication. Resp: +DOE GI: see HPI Derm: +dry skin Psych: Denies depression, anxiety, memory loss, confusion. No homicidal or suicidal ideation.  Heme: Denies bruising, bleeding, and enlarged lymph nodes.  Physical Exam: BP 117/74  Pulse 85  Temp(Src) 97.9 F (36.6 C) (Oral)  Ht 5' 2" (1.575 m)  Wt 207 lb 6.4 oz (94.076 kg)  BMI 37.92 kg/m2 General:   Alert and oriented. No distress noted. Pleasant and cooperative.  Head:  Normocephalic and atraumatic. Eyes:  Conjuctiva clear without scleral icterus. Mouth:  Oral mucosa pink and moist. Good dentition. No lesions. Heart:  S1, S2 present without murmurs, rubs, or gallops. Regular rate and rhythm. Abdomen:  +BS, soft, non-tender and non-distended. No rebound or guarding. Possible small umbilical hernia noted.  Msk:  Symmetrical without gross deformities. Normal posture. Extremities:  Mild pedal/ankle edema Neurologic:  Alert and  oriented x4;  grossly normal neurologically. Skin:  Intact without significant lesions or rashes. Psych:  Alert and cooperative. Normal mood and affect.  April 2014 BPE: Laryngeal penetration without direct visualization of aspiration,  though the patient did demonstrate a somewhat wet sounding cough  subsequent to the witnessed penetration.  Age-related esophageal dysmotility.  Question tiny hiatal hernia.   Aug 2014 MBSS . Suspected primary esophageal dysphagia. Mild oropharyngeal phase dysphagia characterized by delayed oral transit with solids (pt has dentures) and difficulty with anterior posterior transit of barium tablet to between faucial pillars, but once there she swallowed without issue. Pt with consistent premature spillage with thins and nectars to the pyriforms and delay in swallow  initiiation with consistent penetration of liquids (flash without aspiration). Delayed emptying of esophagus noted especially with solids. Significant delay in passing of barium tablet near LES/? small hiatal hernia. The pill passed after several minutes, but did collect food behind it which fillled 2/3 of esophagus. Pt expressed discomfort and pointed to laryngeal area, however no residuals were seen there- perhaps referred symptoms from esophagus. Pt may benefit from repeat EGD (last done in 2012), however she reports that it didn't seem to help that much.    

## 2013-06-04 NOTE — Patient Instructions (Signed)
For constipation: Start taking Linzess 1 capsule each morning on an empty stomach, 30 minutes before breakfast. I have provided a voucher for a month supply and sent to the pharmacy.   We have scheduled you for an upper endoscopy with dilation with Dr. Jena Gauss in the near future. Further recommendations to follow.

## 2013-06-04 NOTE — Telephone Encounter (Signed)
I have LMOM for Sydney Blair to call us back ASAP to R/S EGD/ED because of an emergency added to RMRs schedule on 06/14/13

## 2013-06-05 ENCOUNTER — Other Ambulatory Visit: Payer: Self-pay | Admitting: Internal Medicine

## 2013-06-05 NOTE — Telephone Encounter (Signed)
Returned her call LMOM

## 2013-06-05 NOTE — Progress Notes (Signed)
cc'd to pcp 

## 2013-06-06 ENCOUNTER — Encounter (HOSPITAL_COMMUNITY): Payer: Self-pay | Admitting: Pharmacy Technician

## 2013-06-21 ENCOUNTER — Encounter (HOSPITAL_COMMUNITY): Payer: Self-pay | Admitting: *Deleted

## 2013-06-21 ENCOUNTER — Encounter (HOSPITAL_COMMUNITY)
Admission: RE | Disposition: A | Discharge status: Home / Self Care | Payer: Self-pay | Source: Ambulatory Visit | Attending: Internal Medicine

## 2013-06-21 ENCOUNTER — Ambulatory Visit (HOSPITAL_COMMUNITY)
Admission: RE | Admit: 2013-06-21 | Discharge: 2013-06-21 | Disposition: A | Discharge status: Home / Self Care | Payer: Medicaid Other | Source: Ambulatory Visit | Attending: Internal Medicine | Admitting: Internal Medicine

## 2013-06-21 DIAGNOSIS — J4489 Other specified chronic obstructive pulmonary disease: Secondary | ICD-10-CM | POA: Insufficient documentation

## 2013-06-21 DIAGNOSIS — R1310 Dysphagia, unspecified: Secondary | ICD-10-CM

## 2013-06-21 DIAGNOSIS — K5900 Constipation, unspecified: Secondary | ICD-10-CM

## 2013-06-21 DIAGNOSIS — J449 Chronic obstructive pulmonary disease, unspecified: Secondary | ICD-10-CM | POA: Insufficient documentation

## 2013-06-21 HISTORY — PX: ESOPHAGOGASTRODUODENOSCOPY (EGD) WITH ESOPHAGEAL DILATION: SHX5812

## 2013-06-21 SURGERY — ESOPHAGOGASTRODUODENOSCOPY (EGD) WITH ESOPHAGEAL DILATION
Anesthesia: Moderate Sedation

## 2013-06-21 MED ORDER — ONDANSETRON HCL 4 MG/2ML IJ SOLN
INTRAMUSCULAR | Status: DC | PRN
  Administered 2013-06-21: 4 mg via INTRAVENOUS

## 2013-06-21 MED ORDER — ONDANSETRON HCL 4 MG/2ML IJ SOLN
INTRAMUSCULAR | Status: AC
  Filled 2013-06-21: qty 2

## 2013-06-21 MED ORDER — BUTAMBEN-TETRACAINE-BENZOCAINE 2-2-14 % EX AERO
INHALATION_SPRAY | CUTANEOUS | Status: DC | PRN
  Administered 2013-06-21: 2 via TOPICAL

## 2013-06-21 MED ORDER — MEPERIDINE HCL 100 MG/ML IJ SOLN
INTRAMUSCULAR | Status: AC
  Filled 2013-06-21: qty 2

## 2013-06-21 MED ORDER — SODIUM CHLORIDE 0.9 % IV SOLN
INTRAVENOUS | Status: DC
  Administered 2013-06-21: 10:00:00 via INTRAVENOUS

## 2013-06-21 MED ORDER — MEPERIDINE HCL 100 MG/ML IJ SOLN
INTRAMUSCULAR | Status: DC | PRN
  Administered 2013-06-21: 25 mg via INTRAVENOUS
  Administered 2013-06-21: 50 mg via INTRAVENOUS

## 2013-06-21 MED ORDER — STERILE WATER FOR IRRIGATION IR SOLN
Status: DC | PRN
  Administered 2013-06-21: 11:00:00

## 2013-06-21 MED ORDER — MIDAZOLAM HCL 5 MG/5ML IJ SOLN
INTRAMUSCULAR | Status: DC | PRN
  Administered 2013-06-21: 1 mg via INTRAVENOUS
  Administered 2013-06-21: 2 mg via INTRAVENOUS

## 2013-06-21 MED ORDER — MIDAZOLAM HCL 5 MG/5ML IJ SOLN
INTRAMUSCULAR | Status: AC
  Filled 2013-06-21: qty 10

## 2013-06-21 NOTE — Interval H&P Note (Signed)
History and Physical Interval Note:  06/21/2013 10:43 AM  Sydney Blair  has presented today for surgery, with the diagnosis of DYSPHAGIA  The various methods of treatment have been discussed with the patient and family. After consideration of risks, benefits and other options for treatment, the patient has consented to  Procedure(s) with comments: ESOPHAGOGASTRODUODENOSCOPY (EGD) WITH ESOPHAGEAL DILATION (N/A) - 8:45-rescheduled to 11:30am [name] notified pt as a surgical intervention .  The patient's history has been reviewed, patient examined, no change in status, stable for surgery.  I have reviewed the patient's chart and labs.  Questions were answered to the patient's satisfaction.     [name] The risks, benefits, limitations, alternatives and imponderables have been reviewed with the patient. Potential for esophageal dilation, biopsy, etc. have also been reviewed.  Questions have been answered. All parties agreeable.

## 2013-06-21 NOTE — Op Note (Signed)
[facility] [address] [place], [address]   ENDOSCOPY PROCEDURE REPORT  PATIENT: Sydney Blair, Sydney Blair  MR#: [id number] BIRTHDATE: [birth year] , 57  yrs. old GENDER: Female ENDOSCOPIST: R.  Roetta Sessions, MD FACP FACG REFERRED BY:  Fleet Contras, M.D. PROCEDURE DATE:  06/21/2013 PROCEDURE:     EGD with Elease Hashimoto dilation followed by esophageal biopsy  INDICATIONS:     recurrent esophageal dysphagia  INFORMED CONSENT:   The risks, benefits, limitations, alternatives and imponderables have been discussed.  The potential for biopsy, esophogeal dilation, etc. have also been reviewed.  Questions have been answered.  All parties agreeable.  Please see the history and physical in the medical record for more information.  MEDICATIONS: Versed 3 mg IV and Demerol 75 mg IV in divided doses. Zofran 4 mg IV. Cetacaine spray.  DESCRIPTION OF PROCEDURE:   The EG-2990i ([id number]) and EG-2990i ([id number])  endoscope was introduced through the mouth and advanced to the second portion of the duodenum without difficulty or limitations.  The mucosal surfaces were surveyed very carefully during advancement of the scope and upon withdrawal.  Retroflexion view of the proximal stomach and esophagogastric junction was performed.      FINDINGS: Normal appearing esophageal mucosa. Ttubular esophagus appeared patent throughout its course. Stomach. Minimal streaky erythema of the antrum; otherwise the gastric mucosa appeared entirely normal. Patent pylorus. Normal first and second portion of the duodenum.  THERAPEUTIC / DIAGNOSTIC MANEUVERS PERFORMED:  A 56 French Maloney dilator was passed to full insertion without any resistance, whatsoever. A look back revealed no apparent complication related to this maneuver Subsequently. Biopsies the mid and distal esophagus were taken to assess histology.   COMPLICATIONS:  None  IMPRESSION:  Normal esophagus  - status post Maloney dilation  and subsequent biopsy. Minimal erythema of the gastric mucosa of doubtful clinical significance.  RECOMMENDATIONS:   Continue twice a day omeprazole for now. Followup on pathology. Continue linzess as this appears to be working very well for her constipation.    _______________________________ R. Roetta Sessions, MD FACP [facility] eSigned:  R. Roetta Sessions, MD FACP [facility] 06/21/2013 11:28 AM     CC:  PATIENT NAME:  Vera, Furniss MR#: [id number]

## 2013-06-21 NOTE — H&P (View-Only) (Signed)
Referring Provider: Dorrene German, MD Primary Care Physician:  Dorrene German, MD Primary GI: Dr. Jena Gauss   Chief Complaint  Patient presents with  . Follow-up  . Dysphagia    HPI:   Sydney Blair returns today to discuss a possible repeat upper endoscopy with dilation. Recently seen in our office due to recurrent pill dysphagia. EGD in March 2012 by Dr. Jena Gauss showed small hiatal hernia, normal esophagus, s/p 38 F dilation empirically. Underwent MBSS with Speech Pathology who felt she had mild oral phase dysphagia, mild pharyngeal phase dysphagia, suspected primary esophageal dysphagia.   Pills get "hung up", +solid food dysphagia. No dysphagia with soft foods. Mild odynophagia. Hoarse all the time. No melena, hematochezia. Stays constipated. GERD controlled on BID PPI. No rectal bleeding.   Past Medical History  Diagnosis Date  . Cancer 2005    breast  . Incontinence   . Sebaceous cyst     lt labial location  . Tobacco abuse   . GERD (gastroesophageal reflux disease)   . Depression with anxiety   . COPD (chronic obstructive pulmonary disease)   . ARF (acute renal failure) 08/07/2011  . Fibromyalgia   . Peripheral neuropathy     Past Surgical History  Procedure Laterality Date  . Breast lumpectomy      right with node dissection  . Esophagogastroduodenoscopy  11/11/2010    [id number] hiatal hernia, 56 French dilator  . Colonoscopy  11/11/2010    [id number] rectal polyp and splenic flexure otherwise normal; pathology showed  tubular adenomas. Next TCS 10/2015.  . Bladder tack    . Mastectomy      partial    Current Outpatient Prescriptions  Medication Sig Dispense Refill  . budesonide-formoterol (SYMBICORT) 160-4.5 MCG/ACT inhaler Inhale 1 puff into the lungs 2 (two) times daily.      . cyclobenzaprine (FLEXERIL) 10 MG tablet Take 10 mg by mouth 3 (three) times daily as needed for muscle spasms.      . DULoxetine (CYMBALTA) 60 MG capsule Take 60 mg by mouth  daily. Take with food      . FLUoxetine (PROZAC) 40 MG capsule Take 40 mg by mouth daily.      . fluticasone (FLONASE) 50 MCG/ACT nasal spray Place 2 sprays into the nose daily.      Marland Kitchen levothyroxine (SYNTHROID, LEVOTHROID) 100 MCG tablet Take 100 mcg by mouth daily.      . meclizine (ANTIVERT) 12.5 MG tablet Take 12.5 mg by mouth 3 (three) times daily as needed for dizziness.       . nitroGLYCERIN (NITROSTAT) 0.4 MG SL tablet Place 0.4 mg under the tongue every 5 (five) minutes as needed for chest pain.      Marland Kitchen omeprazole (PRILOSEC) 20 MG capsule Take 20 mg by mouth 2 (two) times daily.       Marland Kitchen oxyCODONE (OXYCONTIN) 10 MG 12 hr tablet Take 10 mg by mouth every 12 (twelve) hours.      . pravastatin (PRAVACHOL) 20 MG tablet Take 40 mg by mouth daily.       . pregabalin (LYRICA) 100 MG capsule Take 300 mg by mouth 2 (two) times daily.       Marland Kitchen spironolactone (ALDACTONE) 50 MG tablet Take 50 mg by mouth daily.      Marland Kitchen tiotropium (SPIRIVA) 18 MCG inhalation capsule Place 18 mcg into inhaler and inhale daily.       No current facility-administered medications for this visit.    Allergies as  of 06/04/2013 - Review Complete 06/04/2013  Allergen Reaction Noted  . Penicillins Anaphylaxis     Family History  Problem Relation Age of Onset  . Cancer Other   . Heart attack Other   . Lung disease Other     History   Social History  . Marital Status: Married    Spouse Name: N/A    Number of Children: 1  . Years of Education: N/A   Occupational History  . disabled    Social History Main Topics  . Smoking status: Current Every Day Smoker -- 1.00 packs/day for 35 years    Types: Cigarettes  . Smokeless tobacco: None     Comment: STILL SMOKING-ONLY 2-3 CIGS DAILY...started at age 68  . Alcohol Use: No  . Drug Use: No  . Sexual Activity: Yes    Birth Control/ Protection: Post-menopausal     Comment: chemo stopped her periods   Other Topics Concern  . None   Social History Narrative    Lives w. Husband   Lost 1 son-?etiology    Review of Systems: Gen: Denies fever, chills, anorexia. Denies fatigue, weakness, weight loss.  CV: Denies chest pain, palpitations, syncope, peripheral edema, and claudication. Resp: +DOE GI: see HPI Derm: +dry skin Psych: Denies depression, anxiety, memory loss, confusion. No homicidal or suicidal ideation.  Heme: Denies bruising, bleeding, and enlarged lymph nodes.  Physical Exam: BP 117/74  Pulse 85  Temp(Src) 97.9 F (36.6 C) (Oral)  Ht 5\' 2"  (1.575 m)  Wt 207 lb 6.4 oz (94.076 kg)  BMI 37.92 kg/m2 General:   Alert and oriented. No distress noted. Pleasant and cooperative.  Head:  Normocephalic and atraumatic. Eyes:  Conjuctiva clear without scleral icterus. Mouth:  Oral mucosa pink and moist. Good dentition. No lesions. Heart:  S1, S2 present without murmurs, rubs, or gallops. Regular rate and rhythm. Abdomen:  +BS, soft, non-tender and non-distended. No rebound or guarding. Possible small umbilical hernia noted.  Msk:  Symmetrical without gross deformities. Normal posture. Extremities:  Mild pedal/ankle edema Neurologic:  Alert and  oriented x4;  grossly normal neurologically. Skin:  Intact without significant lesions or rashes. Psych:  Alert and cooperative. Normal mood and affect.  April 2014 BPE: Laryngeal penetration without direct visualization of aspiration,  though the patient did demonstrate a somewhat wet sounding cough  subsequent to the witnessed penetration.  Age-related esophageal dysmotility.  Question tiny hiatal hernia.   Aug 2014 MBSS . Suspected primary esophageal dysphagia. Mild oropharyngeal phase dysphagia characterized by delayed oral transit with solids (pt has dentures) and difficulty with anterior posterior transit of barium tablet to between faucial pillars, but once there she swallowed without issue. Pt with consistent premature spillage with thins and nectars to the pyriforms and delay in swallow  initiiation with consistent penetration of liquids (flash without aspiration). Delayed emptying of esophagus noted especially with solids. Significant delay in passing of barium tablet near LES/? small hiatal hernia. The pill passed after several minutes, but did collect food behind it which fillled 2/3 of esophagus. Pt expressed discomfort and pointed to laryngeal area, however no residuals were seen there- perhaps referred symptoms from esophagus. Pt may benefit from repeat EGD (last done in 2012), however she reports that it didn't seem to help that much.

## 2013-06-22 ENCOUNTER — Encounter (HOSPITAL_COMMUNITY): Payer: Self-pay | Admitting: Internal Medicine

## 2013-06-26 ENCOUNTER — Encounter: Payer: Self-pay | Admitting: Pulmonary Disease

## 2013-06-26 ENCOUNTER — Encounter: Payer: Self-pay | Admitting: Internal Medicine

## 2013-06-26 ENCOUNTER — Telehealth: Payer: Self-pay

## 2013-06-26 ENCOUNTER — Ambulatory Visit (INDEPENDENT_AMBULATORY_CARE_PROVIDER_SITE_OTHER): Payer: Medicaid Other | Admitting: Pulmonary Disease

## 2013-06-26 VITALS — BP 132/72 | HR 92 | Temp 99.0°F | Ht 62.0 in | Wt 208.4 lb

## 2013-06-26 DIAGNOSIS — J438 Other emphysema: Secondary | ICD-10-CM

## 2013-06-26 DIAGNOSIS — J441 Chronic obstructive pulmonary disease with (acute) exacerbation: Secondary | ICD-10-CM

## 2013-06-26 DIAGNOSIS — J439 Emphysema, unspecified: Secondary | ICD-10-CM

## 2013-06-26 MED ORDER — LEVOFLOXACIN 750 MG PO TABS: 750.0000 mg | ORAL_TABLET | Freq: Every day | ORAL | Status: DC

## 2013-06-26 MED ORDER — PREDNISONE 10 MG PO TABS: ORAL_TABLET | ORAL | Status: DC

## 2013-06-26 NOTE — Patient Instructions (Signed)
Will treat with levaquin 750mg  one a day for 5 days Prednisone taper over 8 days. Stop smoking. Continue your current breathing medications. followup with me in 6mos, but sooner if having issues.

## 2013-06-26 NOTE — Assessment & Plan Note (Signed)
The patient's history is most consistent with acute bronchitis and an acute COPD exacerbation.  She will need treatment with antibiotics as well as a short course of prednisone.  I have counseled her extensively about smoking cessation, and explained that she will continue to have these episodes until she can quit smoking.

## 2013-06-26 NOTE — Telephone Encounter (Signed)
Letter from: Corbin Ade Reason for Letter: Results Review Send letter to patient.  Send copy of letter with path to referring provider and PCP.   Offered patient followup appointment with extender in 6 months

## 2013-06-26 NOTE — Progress Notes (Signed)
  Subjective:    Patient ID: Sydney Blair, female    DOB: 02-05-1956, 57 y.o.   MRN: [id number]  HPI The patient comes in today for followup, but also for an acute sick visit.  She has known COPD with ongoing smoking, and gives a 1-1/2 day history of increasing cough with purulent mucus, chest congestion, and increased shortness of breath with wheezing.  She denies any fever, chills, or sweats.   Review of Systems  Constitutional: Negative for fever and unexpected weight change.  HENT: Negative for congestion, dental problem, ear pain, nosebleeds, postnasal drip, rhinorrhea, sinus pressure, sneezing, sore throat and trouble swallowing.   Eyes: Negative for redness and itching.  Respiratory: Positive for cough, chest tightness, shortness of breath and wheezing.   Cardiovascular: Negative for palpitations and leg swelling.  Gastrointestinal: Negative for nausea and vomiting.  Genitourinary: Negative for dysuria.  Musculoskeletal: Negative for joint swelling.  Skin: Negative for rash.  Neurological: Negative for headaches.  Hematological: Does not bruise/bleed easily.  Psychiatric/Behavioral: Negative for dysphoric mood. The patient is not nervous/anxious.        Objective:   Physical Exam Overweight female in no acute distress Nose without purulent discharge noted Oropharynx clear Neck without lymphadenopathy or thyromegaly Chest with rhonchi noted, mildly decreased breath sounds, no active wheezing Cardiac exam with regular rate and rhythm Lower extremities without significant edema, no cyanosis Alert and oriented, moves all 4 extremities.       Assessment & Plan:

## 2013-06-26 NOTE — Telephone Encounter (Signed)
Letter mailed to pt.  

## 2013-06-26 NOTE — Assessment & Plan Note (Signed)
The patient is on an excellent maintenance dilator regimen, and I have asked her to continue with this.

## 2013-06-26 NOTE — Addendum Note (Signed)
Addended by: Maisie Fus on: 06/26/2013 10:16 AM   Modules accepted: Orders

## 2013-06-26 NOTE — Telephone Encounter (Signed)
Cc PCP 

## 2013-07-02 NOTE — Telephone Encounter (Signed)
REMINDER APPT MADE 

## 2013-07-09 ENCOUNTER — Telehealth: Payer: Self-pay | Admitting: Pulmonary Disease

## 2013-07-09 NOTE — Telephone Encounter (Signed)
Spoke to pt. States that she has finished Levaquin and is still coughing. Cough is non productive and is worse at night. Pt is wanting to know of any medications she can take OTC due to her insurance, it will not cover most rx cough medications.  KC - please advise. Thanks.

## 2013-07-09 NOTE — Telephone Encounter (Signed)
i called and made pt aware of recs. Nothing further needed

## 2013-07-09 NOTE — Telephone Encounter (Signed)
She needs to stop smoking.  This irritates her airways even more and keeps her cough from getting better.  Can try delsym otc or mucinex dm extra strength one in am and pm with large glass of water.

## 2013-07-11 ENCOUNTER — Telehealth: Payer: Self-pay

## 2013-07-11 MED ORDER — LUBIPROSTONE 24 MCG PO CAPS: 24.0000 ug | ORAL_CAPSULE | Freq: Two times a day (BID) | ORAL | Status: DC

## 2013-07-11 NOTE — Telephone Encounter (Signed)
Working on [employer] for [employer]. Pt has [place] medicaid and they will not pay for linzess until pt has tried [place]. Called pt, she has not tried [place]. She is aware that she will have to try it prior to insurance paying for linzess. Also informed her that Valinda Hoar has to be taken with food. She said it was ok to send in rx to pharmacy and she will let us know if it doesn't work so we can re-try [place] for linzess.

## 2013-07-11 NOTE — Telephone Encounter (Signed)
done

## 2013-07-20 ENCOUNTER — Emergency Department (HOSPITAL_COMMUNITY)
Admission: EM | Admit: 2013-07-20 | Discharge: 2013-07-20 | Disposition: A | Discharge status: Home / Self Care | Payer: Medicaid Other | Attending: Emergency Medicine | Admitting: Emergency Medicine

## 2013-07-20 ENCOUNTER — Encounter (HOSPITAL_COMMUNITY): Payer: Self-pay | Admitting: Emergency Medicine

## 2013-07-20 ENCOUNTER — Emergency Department (HOSPITAL_COMMUNITY): Payer: Medicaid Other

## 2013-07-20 DIAGNOSIS — F17200 Nicotine dependence, unspecified, uncomplicated: Secondary | ICD-10-CM | POA: Insufficient documentation

## 2013-07-20 DIAGNOSIS — Z880 Allergy status to penicillin: Secondary | ICD-10-CM | POA: Insufficient documentation

## 2013-07-20 DIAGNOSIS — G8929 Other chronic pain: Secondary | ICD-10-CM | POA: Insufficient documentation

## 2013-07-20 DIAGNOSIS — Z8659 Personal history of other mental and behavioral disorders: Secondary | ICD-10-CM | POA: Insufficient documentation

## 2013-07-20 DIAGNOSIS — Z853 Personal history of malignant neoplasm of breast: Secondary | ICD-10-CM | POA: Insufficient documentation

## 2013-07-20 DIAGNOSIS — J441 Chronic obstructive pulmonary disease with (acute) exacerbation: Secondary | ICD-10-CM | POA: Insufficient documentation

## 2013-07-20 DIAGNOSIS — Z87448 Personal history of other diseases of urinary system: Secondary | ICD-10-CM | POA: Insufficient documentation

## 2013-07-20 DIAGNOSIS — K219 Gastro-esophageal reflux disease without esophagitis: Secondary | ICD-10-CM | POA: Insufficient documentation

## 2013-07-20 DIAGNOSIS — M549 Dorsalgia, unspecified: Secondary | ICD-10-CM | POA: Insufficient documentation

## 2013-07-20 DIAGNOSIS — IMO0001 Reserved for inherently not codable concepts without codable children: Secondary | ICD-10-CM | POA: Insufficient documentation

## 2013-07-20 DIAGNOSIS — R609 Edema, unspecified: Secondary | ICD-10-CM | POA: Insufficient documentation

## 2013-07-20 DIAGNOSIS — R600 Localized edema: Secondary | ICD-10-CM

## 2013-07-20 DIAGNOSIS — Z872 Personal history of diseases of the skin and subcutaneous tissue: Secondary | ICD-10-CM | POA: Insufficient documentation

## 2013-07-20 DIAGNOSIS — Z792 Long term (current) use of antibiotics: Secondary | ICD-10-CM | POA: Insufficient documentation

## 2013-07-20 DIAGNOSIS — Z79899 Other long term (current) drug therapy: Secondary | ICD-10-CM | POA: Insufficient documentation

## 2013-07-20 HISTORY — DX: Dorsalgia, unspecified: M54.9

## 2013-07-20 HISTORY — DX: Sciatica, unspecified side: M54.30

## 2013-07-20 HISTORY — DX: Other chronic pain: G89.29

## 2013-07-20 LAB — COMPREHENSIVE METABOLIC PANEL
ALT: 14 U/L (ref 0–35)
AST: 26 U/L (ref 0–37)
Albumin: 3.1 g/dL — ABNORMAL LOW (ref 3.5–5.2)
Chloride: 95 mEq/L — ABNORMAL LOW (ref 96–112)
Creatinine, Ser: 0.83 mg/dL (ref 0.50–1.10)
Sodium: 136 mEq/L (ref 135–145)
Total Bilirubin: 0.6 mg/dL (ref 0.3–1.2)

## 2013-07-20 LAB — CBC WITH DIFFERENTIAL/PLATELET
Basophils Absolute: 0 10*3/uL (ref 0.0–0.1)
Basophils Relative: 0 % (ref 0–1)
Lymphocytes Relative: 13 % (ref 12–46)
MCHC: 30.7 g/dL (ref 30.0–36.0)
Monocytes Absolute: 0.6 10*3/uL (ref 0.1–1.0)
Neutro Abs: 9.4 10*3/uL — ABNORMAL HIGH (ref 1.7–7.7)
Neutrophils Relative %: 80 % — ABNORMAL HIGH (ref 43–77)
Platelets: 257 10*3/uL (ref 150–400)
RDW: 17.6 % — ABNORMAL HIGH (ref 11.5–15.5)
WBC: 11.8 10*3/uL — ABNORMAL HIGH (ref 4.0–10.5)

## 2013-07-20 LAB — PRO B NATRIURETIC PEPTIDE: Pro B Natriuretic peptide (BNP): 531.5 pg/mL — ABNORMAL HIGH (ref 0–125)

## 2013-07-20 LAB — D-DIMER, QUANTITATIVE: D-Dimer, Quant: 0.73 ug/mL-FEU — ABNORMAL HIGH (ref 0.00–0.48)

## 2013-07-20 MED ORDER — SODIUM CHLORIDE 0.9 % IV SOLN
INTRAVENOUS | Status: DC
  Administered 2013-07-20: 1000 mL via INTRAVENOUS

## 2013-07-20 MED ORDER — ENOXAPARIN SODIUM 100 MG/ML ~~LOC~~ SOLN
100.0000 mg | Freq: Once | SUBCUTANEOUS | Status: AC
  Administered 2013-07-20: 100 mg via SUBCUTANEOUS
  Filled 2013-07-20: qty 1

## 2013-07-20 MED ORDER — IOHEXOL 350 MG/ML SOLN
100.0000 mL | Freq: Once | INTRAVENOUS | Status: AC | PRN
  Administered 2013-07-20: 100 mL via INTRAVENOUS

## 2013-07-20 MED ORDER — FUROSEMIDE 40 MG PO TABS: 40.0000 mg | ORAL_TABLET | Freq: Every day | ORAL | Status: DC

## 2013-07-20 MED ORDER — POTASSIUM CHLORIDE CRYS ER 20 MEQ PO TBCR: 20.0000 meq | EXTENDED_RELEASE_TABLET | Freq: Every day | ORAL | Status: DC

## 2013-07-20 NOTE — ED Provider Notes (Signed)
CSN: [id number]     Arrival date & time 07/20/13  1557 History   First MD Initiated Contact with Patient 07/20/13 1714      This chart was scribed for Ward Givens, MD by Arlan Organ, ED Scribe. This patient was seen in room APA12/APA12 and the patient's care was started 5:18 PM.   Chief Complaint  Patient presents with  . Peripheral Neuropathy   The history is provided by the patient. No language interpreter was used.   HPI Comments: Sydney Blair is a 57 y.o. Female with a hx of chronic back pain who presents to the Emergency Department complaining of lower extremity heaviness that started about 1 year ago, but has worsened in the last week. She denies any recent injury. Pt also reports recent weight gain, chronic back pain which she says is relieved with steroid injections shots. She states she has a 3rd injection scheduled for her current back pain next week. Pt states she feels a lot of pressure and weight in her legs when she walks. She says she feels as if "someone is pushing down on her hips and legs" preventing her from moving her legs. She reports leg swelling that started 3 days ago and intermittent SOB. She reports having a 2%  oxygen  at home. She says typically uses her oxygen when she is sitting up. Pt says she currently has a cane, but does not use it all of the time. She currently lives with her husband who helps her throughout the day when he is home. Pt says she smokes about 1 pack of cigarettes a day. She denies fever, CP, nausea, or emesis. Pt reports going through treatment for breast cancer in 2005. Patient not denies any prior history of DVT or any recent injury.  PCP Dr Concepc[facility]  Past Medical History  Diagnosis Date  . Cancer 2005    breast  . Incontinence   . Sebaceous cyst     lt labial location  . Tobacco abuse   . GERD (gastroesophageal reflux disease)   . Depression with anxiety   . COPD (chronic obstructive pulmonary  disease)   . ARF (acute renal failure) 08/07/2011  . Fibromyalgia   . Peripheral neuropathy   . Chronic back pain   . Sciatic pain    Past Surgical History  Procedure Laterality Date  . Breast lumpectomy      right with node dissection  . Esophagogastroduodenoscopy  11/11/2010    YNW:GNFAO hiatal hernia, 56 French dilator  . Colonoscopy  11/11/2010    ZHY:QMVHQIONGE rectal polyp and splenic flexure otherwise normal; pathology showed  tubular adenomas. Next TCS 10/2015.  . Bladder tack    . Mastectomy      partial  . Esophagogastroduodenoscopy (egd) with esophageal dilation N/A 06/21/2013    Procedure: ESOPHAGOGASTRODUODENOSCOPY (EGD) WITH ESOPHAGEAL DILATION;  Surgeon: Corbin Ade, MD;  Location: [facility];  Service: Endoscopy;  Laterality: N/A;  8:45-rescheduled to 11:30am [name] notified pt   Family History  Problem Relation Age of Onset  . Cancer Other   . Heart attack Other   . Lung disease Other    History  Substance Use Topics  . Smoking status: Current Every Day Smoker -- 1.00 packs/day for 35 years    Types: Cigarettes  . Smokeless tobacco: Not on file     Comment: STILL SMOKING-ONLY 2-3 CIGS DAILY...started at age 55  . Alcohol Use: No   Lives at home  Lives with husband Uses oxygen 2 lpm [place] at night  OB History   Grav Para Term Preterm Abortions TAB SAB Ect Mult Living                 Review of Systems  Constitutional: Negative for fever.  Respiratory: Negative for shortness of breath.   Musculoskeletal: Positive for back pain.  Skin:       Lower extremity heaviness  All other systems reviewed and are negative.    Allergies  Penicillins  Home Medications   Current Outpatient Rx  Name  Route  Sig  Dispense  Refill  . budesonide-formoterol (SYMBICORT) 160-4.5 MCG/ACT inhaler   Inhalation   Inhale 1 puff into the lungs 2 (two) times daily.         . cyclobenzaprine (FLEXERIL) 10 MG tablet   Oral   Take 10 mg by mouth 3 (three) times  daily as needed for muscle spasms.         . fluticasone (FLONASE) 50 MCG/ACT nasal spray   Nasal   Place 2 sprays into the nose daily.         Marland Kitchen levofloxacin (LEVAQUIN) 750 MG tablet   Oral   Take 1 tablet (750 mg total) by mouth daily.   5 tablet   0   . levothyroxine (SYNTHROID, LEVOTHROID) 100 MCG tablet   Oral   Take 100 mcg by mouth daily.         Marland Kitchen lubiprostone (AMITIZA) 24 MCG capsule   Oral   Take 1 capsule (24 mcg total) by mouth 2 (two) times daily with a meal.   60 capsule   3   . meclizine (ANTIVERT) 12.5 MG tablet   Oral   Take 12.5 mg by mouth 3 (three) times daily as needed for dizziness.          . nitroGLYCERIN (NITROSTAT) 0.4 MG SL tablet   Sublingual   Place 0.4 mg under the tongue every 5 (five) minutes as needed for chest pain.         Marland Kitchen omeprazole (PRILOSEC) 20 MG capsule   Oral   Take 20 mg by mouth 2 (two) times daily.          Marland Kitchen oxyCODONE-acetaminophen (PERCOCET) 10-325 MG per tablet   Oral   Take 1 tablet by mouth 2 (two) times daily.         . pravastatin (PRAVACHOL) 20 MG tablet   Oral   Take 40 mg by mouth daily.          . predniSONE (DELTASONE) 10 MG tablet      Take 4 tabs po x 2 days, then 3 x 2 days, then 2 x 2 days, then 1 x 2 days then stop.   20 tablet   0   . pregabalin (LYRICA) 100 MG capsule   Oral   Take 300 mg by mouth 2 (two) times daily.          Marland Kitchen spironolactone (ALDACTONE) 50 MG tablet   Oral   Take 50 mg by mouth daily.         Marland Kitchen tiotropium (SPIRIVA) 18 MCG inhalation capsule   Inhalation   Place 18 mcg into inhaler and inhale daily.          Triage Vitals: BP 100/50  Pulse 90  Temp(Src) 98.1 F (36.7 C) (Oral)  Resp 20  Ht 5\' 2"  (1.575 m)  Wt 207 lb (93.895 kg)  BMI 37.85 kg/m2  SpO2 96%  Vital signs normal    Physical Exam  Nursing note and vitals reviewed. Constitutional: She is oriented to person, place, and time. She appears well-developed and well-nourished.   Non-toxic appearance. She does not appear ill. No distress.  HENT:  Head: Normocephalic and atraumatic.  Right Ear: External ear normal.  Left Ear: External ear normal.  Nose: Nose normal. No mucosal edema or rhinorrhea.  Mouth/Throat: Oropharynx is clear and moist and mucous membranes are normal. No dental abscesses or uvula swelling.  Eyes: Conjunctivae and EOM are normal. Pupils are equal, round, and reactive to light.  Neck: Normal range of motion and full passive range of motion without pain. Neck supple.  Cardiovascular: Normal rate, regular rhythm and normal heart sounds.  Exam reveals no gallop and no friction rub.   No murmur heard. Pulmonary/Chest: Effort normal. No respiratory distress. She has wheezes. She has no rhonchi. She has no rales. She exhibits no tenderness and no crepitus.  Rare wheeze  Abdominal: Soft. Normal appearance and bowel sounds are normal. She exhibits no distension. There is no tenderness. There is no rebound and no guarding.  Musculoskeletal: Normal range of motion. She exhibits no edema and no tenderness.  Moves all extremities well.  No pitting edema No redness or lesions of skin  Neurological: She is alert and oriented to person, place, and time. She has normal strength. No cranial nerve deficit.  Skin: Skin is warm, dry and intact. No rash noted. No erythema. No pallor.  Psychiatric: She has a normal mood and affect. Her speech is normal and behavior is normal. Her mood appears not anxious.    ED Course  Procedures (including critical care time)  Medications  0.9 %  sodium chloride infusion (0 mLs Intravenous Stopped 07/20/13 2251)  iohexol (OMNIPAQUE) 350 MG/ML injection 100 mL (100 mLs Intravenous Contrast Given 07/20/13 2111)  enoxaparin (LOVENOX) injection 100 mg (100 mg Subcutaneous Given 07/20/13 2241)     DIAGNOSTIC STUDIES: Oxygen Saturation is 96% on RA, Adequate by my interpretation.    COORDINATION OF CARE: 5:18 PM- Will order  chest X-Ray and blood work. Discussed treatment plan with pt at bedside and pt agreed to plan.     8:22 PM- Discussed lab results with pt.  Spoke to pt regarding CT angio scan for possible pulmonary embolism or pneumonia.  Labs Review Results for orders placed during the hospital encounter of 07/20/13  PRO B NATRIURETIC PEPTIDE      Result Value Range   Pro B Natriuretic peptide (BNP) 531.5 (*) 0 - 125 pg/mL  D-DIMER, QUANTITATIVE      Result Value Range   D-Dimer, Quant 0.73 (*) 0.00 - 0.48 ug/mL-FEU  CBC WITH DIFFERENTIAL      Result Value Range   WBC 11.8 (*) 4.0 - 10.5 K/uL   RBC 4.08  3.87 - 5.11 MIL/uL   Hemoglobin 11.1 (*) 12.0 - 15.0 g/dL   HCT 16.1  09.6 - 04.5 %   MCV 88.7  78.0 - 100.0 fL   MCH 27.2  26.0 - 34.0 pg   MCHC 30.7  30.0 - 36.0 g/dL   RDW 40.9 (*) 81.1 - 91.4 %   Platelets 257  150 - 400 K/uL   Neutrophils Relative % 80 (*) 43 - 77 %   Neutro Abs 9.4 (*) 1.7 - 7.7 K/uL   Lymphocytes Relative 13  12 - 46 %   Lymphs Abs 1.5  0.7 - 4.0 K/uL   Monocytes Relative  5  3 - 12 %   Monocytes Absolute 0.6  0.1 - 1.0 K/uL   Eosinophils Relative 1  0 - 5 %   Eosinophils Absolute 0.2  0.0 - 0.7 K/uL   Basophils Relative 0  0 - 1 %   Basophils Absolute 0.0  0.0 - 0.1 K/uL  COMPREHENSIVE METABOLIC PANEL      Result Value Range   Sodium 136  135 - 145 mEq/L   Potassium 3.4 (*) 3.5 - 5.1 mEq/L   Chloride 95 (*) 96 - 112 mEq/L   CO2 32  19 - 32 mEq/L   Glucose, Bld 111 (*) 70 - 99 mg/dL   BUN 7  6 - 23 mg/dL   Creatinine, Ser 1.61  0.50 - 1.10 mg/dL   Calcium 9.2  8.4 - 09.6 mg/dL   Total Protein 6.9  6.0 - 8.3 g/dL   Albumin 3.1 (*) 3.5 - 5.2 g/dL   AST 26  0 - 37 U/L   ALT 14  0 - 35 U/L   Alkaline Phosphatase 98  39 - 117 U/L   Total Bilirubin 0.6  0.3 - 1.2 mg/dL   GFR calc non Af Amer 78 (*) >90 mL/min   GFR calc Af Amer >90  >90 mL/min   Laboratory interpretation all normal except elevated d-dimer, mild elevation of BNP, mild leukocytosis   Imaging  Review Dg Chest 2 View  07/20/2013   CLINICAL DATA:  Shortness of breath.  EXAM: CHEST  2 VIEW  COMPARISON:  CTA chest 06/20/2012  FINDINGS: The heart size and mediastinal contours are within normal limits. There are a few faint peripheral opacities, one in the right upper lung field, one in the right mid lung field, and one in the left mid lung field. Mild diffuse interstitial prominence is seen throughout both lungs, chronic. There is irregularity in contour of at least two right ribs, that are felt to correspond to previously described fractured right ribs on the chest CT of 06/20/2012. No definite acute rib fracture is seen. Negative for pneumothorax.  IMPRESSION: 1. Question small faint areas of airspace disease in both lungs. Multifocal pneumonia cannot be excluded, suggest clinical correlation. Focal pleural abnormalities could have a similar appearance. 2. Remote right rib fractures.   Electronically Signed   By: Britta Mccreedy M.D.   On: 07/20/2013 18:49   Ct Angio Chest W/cm &/or Wo Cm  07/20/2013   CLINICAL DATA:  Shortness of breath. Abnormal chest x-ray. Swelling and pain in legs. Increased D-dimer.  EXAM: CT ANGIOGRAPHY CHEST WITH CONTRAST  TECHNIQUE: Multidetector CT imaging of the chest was performed using the standard protocol during bolus administration of intravenous contrast. Multiplanar CT image reconstructions including MIPs were obtained to evaluate the vascular anatomy.  CONTRAST:  OMNIPAQUE IOHEXOL 350 MG/ML SOLN  COMPARISON:  06/20/2012  FINDINGS: Technically adequate study with good opacification of the central and segmental pulmonary arteries. No focal filling defects demonstrated. No evidence of significant pulmonary embolus. There is mild emphysematous change in the upper lungs with slight peripheral fibrosis noted. There are multiple old right rib fractures. Chest x-ray changes likely correspond to the fibrosis and the rib fractures. No focal airspace disease or  consolidation. No pneumothorax. No pleural effusions. No significant lymphadenopathy in the chest. There are calcified lymph nodes in the mediastinum. The esophagus is decompressed. There is a small Bochdalek's type diaphragmatic hernia on the right containing fat. Visualized portions of the upper abdominal organs are grossly unremarkable.  Review of the MIP images confirms the above findings.  IMPRESSION: No evidence of significant pulmonary embolus. No evidence of active pulmonary disease.   Electronically Signed   By: Burman Nieves M.D.   On: 07/20/2013 21:27    EKG Interpretation   None       MDM   1. Peripheral edema    Discharge medications Lasix, potassium if her doppler US is negative   Plan discharge   I personally performed the services described in this documentation, which was scribed in my presence. The recorded information has been reviewed and considered.  Devoria Albe, MD, Armando Gang   Ward Givens, MD 07/20/13 [phone]

## 2013-07-20 NOTE — ED Notes (Signed)
States she is having increased difficulty walking onset yesterday, states she has peripheral neuropathy

## 2013-07-21 ENCOUNTER — Other Ambulatory Visit (HOSPITAL_COMMUNITY): Payer: Medicaid Other

## 2013-07-23 ENCOUNTER — Other Ambulatory Visit (HOSPITAL_COMMUNITY): Payer: Self-pay | Admitting: Emergency Medicine

## 2013-07-23 ENCOUNTER — Ambulatory Visit (HOSPITAL_COMMUNITY)
Admission: RE | Admit: 2013-07-23 | Discharge: 2013-07-23 | Disposition: A | Discharge status: Home / Self Care | Payer: Medicaid Other | Source: Ambulatory Visit | Attending: Emergency Medicine | Admitting: Emergency Medicine

## 2013-07-23 DIAGNOSIS — M7989 Other specified soft tissue disorders: Secondary | ICD-10-CM

## 2013-08-09 ENCOUNTER — Telehealth: Payer: Self-pay

## 2013-08-09 MED ORDER — LINACLOTIDE 145 MCG PO CAPS: 145.0000 ug | ORAL_CAPSULE | Freq: Every day | ORAL | Status: DC

## 2013-08-09 NOTE — Telephone Encounter (Signed)
agree

## 2013-08-09 NOTE — Telephone Encounter (Signed)
Pt called- she tried the [place] and it is not working for her and it is hurting her stomach. Advised pt that we can proceed with PA for linzess. I will send in PA to [facility] medicaid.

## 2013-09-20 ENCOUNTER — Other Ambulatory Visit (HOSPITAL_COMMUNITY): Payer: Self-pay | Admitting: Internal Medicine

## 2013-09-20 DIAGNOSIS — Z139 Encounter for screening, unspecified: Secondary | ICD-10-CM

## 2013-09-21 ENCOUNTER — Ambulatory Visit (HOSPITAL_COMMUNITY)
Admission: RE | Admit: 2013-09-21 | Discharge: 2013-09-21 | Disposition: A | Discharge status: Home / Self Care | Payer: Medicaid Other | Source: Ambulatory Visit | Attending: Internal Medicine | Admitting: Internal Medicine

## 2013-09-21 DIAGNOSIS — Z139 Encounter for screening, unspecified: Secondary | ICD-10-CM

## 2013-09-21 DIAGNOSIS — Z1231 Encounter for screening mammogram for malignant neoplasm of breast: Secondary | ICD-10-CM | POA: Insufficient documentation

## 2013-11-27 ENCOUNTER — Encounter: Payer: Self-pay | Admitting: Internal Medicine

## 2013-12-24 ENCOUNTER — Ambulatory Visit (HOSPITAL_COMMUNITY): Payer: Medicaid Other

## 2013-12-24 ENCOUNTER — Ambulatory Visit: Payer: Medicaid Other | Admitting: Pulmonary Disease

## 2013-12-27 ENCOUNTER — Other Ambulatory Visit (HOSPITAL_COMMUNITY): Payer: Self-pay | Admitting: Internal Medicine

## 2013-12-27 ENCOUNTER — Ambulatory Visit (HOSPITAL_COMMUNITY)
Admission: RE | Admit: 2013-12-27 | Discharge: 2013-12-27 | Disposition: A | Discharge status: Home / Self Care | Payer: Medicaid Other | Source: Ambulatory Visit | Attending: Internal Medicine | Admitting: Internal Medicine

## 2013-12-27 DIAGNOSIS — M79609 Pain in unspecified limb: Secondary | ICD-10-CM

## 2013-12-27 DIAGNOSIS — I739 Peripheral vascular disease, unspecified: Secondary | ICD-10-CM

## 2013-12-27 NOTE — Progress Notes (Signed)
VASCULAR LAB PRELIMINARY  ARTERIAL  ABI completed: ABIs indicate adequate blood flow.    RIGHT    LEFT    PRESSURE WAVEFORM  PRESSURE WAVEFORM  BRACHIAL Breast cancer  BRACHIAL 142 T  DP   DP    AT 172 B AT 162 T  PT 190 B PT 184 T  PER   PER    GREAT TOE  NA GREAT TOE  NA    RIGHT LEFT  ABI 1.34 1.30     Tiah Heckel R Marko Skalski, RVT 12/27/2013, 11:35 AM

## 2014-01-11 ENCOUNTER — Ambulatory Visit (INDEPENDENT_AMBULATORY_CARE_PROVIDER_SITE_OTHER): Payer: Medicaid Other

## 2014-01-11 VITALS — BP 100/71 | HR 97 | Resp 16 | Ht 62.0 in | Wt 200.0 lb

## 2014-01-11 DIAGNOSIS — M2010 Hallux valgus (acquired), unspecified foot: Secondary | ICD-10-CM

## 2014-01-11 DIAGNOSIS — S93336A Other dislocation of unspecified foot, initial encounter: Secondary | ICD-10-CM

## 2014-01-11 DIAGNOSIS — S93104A Unspecified dislocation of right toe(s), initial encounter: Secondary | ICD-10-CM

## 2014-01-11 DIAGNOSIS — M2040 Other hammer toe(s) (acquired), unspecified foot: Secondary | ICD-10-CM

## 2014-01-11 NOTE — Patient Instructions (Signed)
Pre-Operative Instructions  Congratulations, you have decided to take an important step to improving your quality of life.  You can be assured that the doctors of [facility] will be with you every step of the way.  1. Plan to be at the surgery center/hospital at least 1 (one) hour prior to your scheduled time unless otherwise directed by the surgical center/hospital staff.  You must have a responsible adult accompany you, remain during the surgery and drive you home.  Make sure you have directions to the surgical center/hospital and know how to get there on time. 2. For hospital based surgery you will need to obtain a history and physical form from your family physician within 1 month prior to the date of surgery- we will give you a form for you primary physician.  3. We make every effort to accommodate the date you request for surgery.  There are however, times where surgery dates or times have to be moved.  We will contact you as soon as possible if a change in schedule is required.   4. No Aspirin/Ibuprofen for one week before surgery.  If you are on aspirin, any non-steroidal anti-inflammatory medications (Mobic, Aleve, Ibuprofen) you should stop taking it 7 days prior to your surgery.  You make take Tylenol  For pain prior to surgery.  5. Medications- If you are taking daily heart and blood pressure medications, seizure, reflux, allergy, asthma, anxiety, pain or diabetes medications, make sure the surgery center/hospital is aware before the day of surgery so they may notify you which medications to take or avoid the day of surgery. 6. No food or drink after midnight the night before surgery unless directed otherwise by surgical center/hospital staff. 7. No alcoholic beverages 24 hours prior to surgery.  No smoking 24 hours prior to or 24 hours after surgery. 8. Wear loose pants or shorts- loose enough to fit over bandages, boots, and casts. 9. No slip on shoes, sneakers are best. 10. Bring  your boot with you to the surgery center/hospital.  Also bring crutches or a walker if your physician has prescribed it for you.  If you do not have this equipment, it will be provided for you after surgery. 11. If you have not been contracted by the surgery center/hospital by the day before your surgery, call to confirm the date and time of your surgery. 12. Leave-time from work may vary depending on the type of surgery you have.  Appropriate arrangements should be made prior to surgery with your employer. 13. Prescriptions will be provided immediately following surgery by your doctor.  Have these filled as soon as possible after surgery and take the medication as directed. 14. Remove nail polish on the operative foot. 15. Wash the night before surgery.  The night before surgery wash the foot and leg well with the antibacterial soap provided and water paying special attention to beneath the toenails and in between the toes.  Rinse thoroughly with water and dry well with a towel.  Perform this wash unless told not to do so by your physician.  Enclosed: 1 Ice pack (please put in freezer the night before surgery)   1 Hibiclens skin cleaner   Pre-op Instructions  If you have any questions regarding the instructions, do not hesitate to call our office.  [place]: [address] [place], [place] [address] [phone]  [place]: [address] [place], [place] [address] [phone]  [place]: [address]  [place], [place] [address] [phone]  Dr. Asyia Hornung   Tuchman DPM, Dr. Norman Regal DPM Dr. Adelae Yodice DPM, Dr. M. Todd Hyatt DPM, Dr. Kathryn Egerton DPM 

## 2014-01-11 NOTE — Progress Notes (Signed)
   Subjective:    Patient ID: Sydney Blair, female    DOB: 12/29/1955, 59 y.o.   MRN: [id number]  HPI Comments: "I have this bad toe"  Patient c/o aching 1st MPJ and 2nd toe right for about 2 years. The 2nd toe overlaps the 1st toe. The toe sometimes looks purple and red. She can't wear enclosed shoes-too painful. PCP sent for vascular studies because of discoloration and results were good.  Foot Pain Associated symptoms include coughing, fatigue, myalgias and numbness.      Review of Systems  Constitutional: Positive for fatigue.  HENT: Positive for tinnitus.   Eyes: Positive for visual disturbance.  Respiratory: Positive for cough.   Musculoskeletal: Positive for back pain, gait problem and myalgias.  Neurological: Positive for dizziness and numbness.  All other systems reviewed and are negative.      Objective:   Physical Exam Vascular status is intact with pedal pulses palpable DP +2/4 bilateral PT plus one over 4 bilateral ABIs were 1.3 posse some limited he cannot rule out noncompressible vessels however pulses are certainly palpable temperature is warm and has mild Raynaud's type symptomology with her toes change in color at times patient explains that happens during cold weather and weather changes in particular. 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 Neurologically epicritic sensations diminished on Semmes Weinstein testing to forefoot digits patient does have a history peripheral neuropathy as well as fibromyalgia contributing factors to her pain in symptomology as well as abnormal gait and posse development deforming the first place her left foot is relatively rectus and unremarkable. 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 #2 is hammertoe deformity with redislocation second toe right foot which is painful tender symptomatic and arthritic. At this time discussed options for reconstruction which would likely require Lapidus and reviewed and Green as well as hammertoe repair and tenotomy capsulotomy possibly 12 weeks recovery in the air fracture boot to 6 weeks of nonweightbearing during the recovery. The other option was offered to the patient is a simple amputation of the second toe at the MTP joint his toes or redislocated nonfunctional and since the bunion and great toe is asymptomatic patient would likely continue to ambulate would be a Darco or surgical shoe for 3-4 weeks tops. Patient would easily be able to care for self a she does have take care of herself.. Patient has a husband these gone from home prolonged periods of time she would not have anybody help take care of her if she had the immobilizer nonweightbearing. Based on that parameter patient has elected my recommendation is a simple amputation consent form is reviewed and signed all questions asked by the patient are answered there no contraindications of surgery will be scheduled her convenience with appropriate provided postop followup thereafter.  Harriet Masson DPM

## 2014-01-16 ENCOUNTER — Encounter: Payer: Self-pay | Admitting: Gastroenterology

## 2014-01-16 ENCOUNTER — Other Ambulatory Visit: Payer: Self-pay | Admitting: Gastroenterology

## 2014-01-16 ENCOUNTER — Ambulatory Visit (INDEPENDENT_AMBULATORY_CARE_PROVIDER_SITE_OTHER): Payer: Medicaid Other | Admitting: Gastroenterology

## 2014-01-16 VITALS — BP 142/80 | HR 86 | Temp 97.6°F | Ht 62.0 in | Wt 196.0 lb

## 2014-01-16 DIAGNOSIS — K5900 Constipation, unspecified: Secondary | ICD-10-CM

## 2014-01-16 DIAGNOSIS — R490 Dysphonia: Secondary | ICD-10-CM

## 2014-01-16 DIAGNOSIS — K219 Gastro-esophageal reflux disease without esophagitis: Secondary | ICD-10-CM

## 2014-01-16 DIAGNOSIS — Z800 Family history of malignant neoplasm of digestive organs: Secondary | ICD-10-CM

## 2014-01-16 DIAGNOSIS — R1319 Other dysphagia: Secondary | ICD-10-CM

## 2014-01-16 NOTE — Patient Instructions (Signed)
1. Referral to ear nose and throat doctor for your chronic hoarseness. 2. Return to the office in 6 months. 3. If you decide you would like to have further testing for your swallowing issues, i.e. manometry at [facility] please let me know. 4. For feeling of fullness, poor digestion, I would recommend eating 5-6 small noted fat meals/snacks daily. If her symptoms worsen, would advise you to have a gastric emptying study. Please call and let me know if you have any further issues.  Gastroparesis  Gastroparesis is also called slowed stomach emptying (delayed gastric emptying). It is a condition in which the stomach takes too long to empty its contents. It often happens in people with diabetes.  CAUSES  Gastroparesis happens when nerves to the stomach are damaged or stop working. When the nerves are damaged, the muscles of the stomach and intestines do not work normally. The movement of food is slowed or stopped. High blood glucose (sugar) causes changes in nerves and can damage the blood vessels that carry oxygen and nutrients to the nerves. RISK FACTORS  Diabetes.  Post-viral syndromes.  Eating disorders (anorexia, bulimia).  Surgery on the stomach or vagus nerve.  Gastroesophageal reflux disease (rarely).  Smooth muscle disorders (amyloidosis, scleroderma).  Metabolic disorders, including hypothyroidism.  Parkinson disease. SYMPTOMS   Heartburn.  Feeling sick to your stomach (nausea).  Vomiting of undigested food.  An early feeling of fullness when eating.  Weight loss.  Abdominal bloating.  Erratic blood glucose levels.  Lack of appetite.  Gastroesophageal reflux.  Spasms of the stomach wall. Complications can include:  Bacterial overgrowth in stomach. Food stays in the stomach and can ferment and cause bacteria to grow.  Weight loss due to difficulty digesting and absorbing nutrients.  Vomiting.  Obstruction in the stomach. Undigested food can harden and cause  nausea and vomiting.  Blood glucose fluctuations caused by inconsistent food absorption. DIAGNOSIS  The diagnosis of gastroparesis is confirmed through one or more of the following tests:  Barium X-rays and scans. These tests look at how long it takes for food to move through the stomach.  Gastric manometry. This test measures electrical and muscular activity in the stomach. A thin tube is passed down the throat into the stomach. The tube contains a wire that takes measurements of the stomach's electrical and muscular activity as it digests liquids and solid food.  Endoscopy. This procedure is done with a long, thin tube called an endoscope. It is passed through the mouth and gently down the esophagus into the stomach. This tube helps the caregiver look at the lining of the stomach to check for any abnormalities.  Ultrasonography. This can rule out gallbladder disease or pancreatitis. This test will outline and define the shape of the gallbladder and pancreas. TREATMENT   Treatments may include:  Exercise.  Medicines to control nausea and vomiting.  Medicines to stimulate stomach muscles.  Changes in what and when you eat.  Having smaller meals more often.  Eating low-fiber forms of high-fiber foods, such as eating cooked vegetables instead of raw vegetables.  Eating low-fat foods.  Consuming liquids, which are easier to digest.  In severe cases, feeding tubes and intravenous (IV) feeding may be needed. It is important to note that in most cases, treatment does not cure gastroparesis. It is usually a lasting (chronic) condition. Treatment helps you manage the underlying condition so that you can be as healthy and comfortable as possible. Other treatments  A gastric neurostimulator has been developed to  assist people with gastroparesis. The battery-operated device is surgically implanted. It emits mild electrical pulses to help improve stomach emptying and to control nausea and  vomiting.  The use of botulinum toxin has been shown to improve stomach emptying by decreasing the prolonged contractions of the muscle between the stomach and the small intestine (pyloric sphincter). The benefits are temporary. SEEK MEDICAL CARE IF:   You have diabetes and you are having problems keeping your blood glucose in goal range.  You are having nausea, vomiting, bloating, or early feelings of fullness with eating.  Your symptoms do not change with a change in diet. Document Released: 08/09/2005 Document Revised: 12/04/2012 Document Reviewed: 01/16/2009 [facility] Patient Information 2014 [place], [place].

## 2014-01-16 NOTE — Assessment & Plan Note (Signed)
No improvement with esophageal dilation. Unremarkable esophageal biopsies. Age-related esophageal motility noted on barium swallow last year. Previous findings of modified barium swallow study with esophageal emptying issues noted. Offered patient esophageal manometry at [facility]. She is not interested at this time. Discussed swallowing precautions. She will call if symptoms become progressive.

## 2014-01-16 NOTE — Assessment & Plan Note (Signed)
No improvement on omeprazole twice a day. Chronic smoker. Family history of throat cancer, father. Recommend ENT evaluation.

## 2014-01-16 NOTE — Assessment & Plan Note (Addendum)
GERD, well controlled on omeprazole twice a day. Anti-reflex measures discussed.  Complains of early satiety, bloating. Possible gastroparesis. Discussed options of gastric emptying study versus trial of gastroparesis diet. She prefers trial of diet. She will call with any progressive symptoms.

## 2014-01-16 NOTE — Assessment & Plan Note (Signed)
Doing well on Linzess.

## 2014-01-16 NOTE — Progress Notes (Signed)
Primary Care Physician: Philis Fendt, MD  Primary Gastroenterologist:  Garfield Cornea, MD   Chief Complaint  Patient presents with  . Follow-up    HPI: Sydney Blair is a 58 y.o. female here for followup office visit. She was last seen at time of EGD in October for dysphagia. She had a normal-appearing esophagus. 38 French Maloney dilator was passed without any evidence of abnormality. Esophageal biopsies were negative. Advised to continue omeprazole twice a day. She has also been on Linzess for constipation. Next colonoscopy due 2017.Underwent MBSS with Speech Pathology last year who felt she had mild oral phase dysphagia, mild pharyngeal phase dysphagia, suspected primary esophageal dysphagia.   Complains of ongoing problems with swallowing pills and solid food. No difficulty with liquids. Has to chew food thoroughly. Feels like food goes down wrong way at times. Complains of hoarseness. Smoking pack of cigarettes per day. No heartburn on omeprazole BID. Linzess helps constipation. No melena, brbpr. No abdominal pain.   Seeing Dr. Merlene Laughter for chronic pain, fibromyalgia, sensory neuropathy. On Tylenol No. 3, Neurontin, Cymbalta.  Weight down 10 pounds now off Lyrica. Changed diet a lot due to prediabetes.   Father had throat cancer.   Current Outpatient Prescriptions  Medication Sig Dispense Refill  . acetaminophen-codeine (TYLENOL #3) 300-30 MG per tablet Take by mouth every 4 (four) hours as needed for moderate pain.      Marland Kitchen albuterol (PROVENTIL HFA;VENTOLIN HFA) 108 (90 BASE) MCG/ACT inhaler Inhale 2 puffs into the lungs every 6 (six) hours as needed for wheezing or shortness of breath.      Marland Kitchen aspirin 81 MG tablet Take 81 mg by mouth daily.      . budesonide-formoterol (SYMBICORT) 160-4.5 MCG/ACT inhaler Inhale 2 puffs into the lungs 2 (two) times daily.      . cetirizine (ZYRTEC) 10 MG tablet Take 10 mg by mouth daily.      . cyclobenzaprine (FLEXERIL) 10 MG tablet  Take 10 mg by mouth at bedtime.       . DULoxetine (CYMBALTA) 60 MG capsule Take 60 mg by mouth daily.      Marland Kitchen gabapentin (NEURONTIN) 400 MG capsule Take 400 mg by mouth 3 (three) times daily.      Marland Kitchen levothyroxine (SYNTHROID, LEVOTHROID) 100 MCG tablet Take 100 mcg by mouth daily.      . Linaclotide (LINZESS) 145 MCG CAPS capsule Take 1 capsule (145 mcg total) by mouth daily.  30 capsule  11  . meclizine (ANTIVERT) 12.5 MG tablet Take 12.5 mg by mouth 2 (two) times daily as needed for dizziness.       Marland Kitchen omeprazole (PRILOSEC) 20 MG capsule Take 20 mg by mouth 2 (two) times daily.       . pravastatin (PRAVACHOL) 40 MG tablet Take 40 mg by mouth every evening.      . Vortioxetine HBr (BRINTELLIX) 10 MG TABS Take 10 mg by mouth every morning.      . zolpidem (AMBIEN) 5 MG tablet Take 5 mg by mouth at bedtime as needed for sleep.       No current facility-administered medications for this visit.    Allergies as of 01/16/2014 - Review Complete 01/16/2014  Allergen Reaction Noted  . Penicillins Anaphylaxis     ROS:  General: Negative for anorexia, weight loss, fever, chills, fatigue, weakness. ENT: Negative for nasal congestion. See history of present illness CV: Negative for chest pain, angina, palpitations, dyspnea on exertion, peripheral edema.  Respiratory: Negative for dyspnea at rest, dyspnea on exertion,  sputum, wheezing. See history of present illness GI: See history of present illness. GU:  Negative for dysuria, hematuria, urinary incontinence, urinary frequency, nocturnal urination.  Endo: Negative for unusual weight change.    Physical Examination:   BP 142/80  Pulse 86  Temp(Src) 97.6 F (36.4 C) (Oral)  Ht 5\' 2"  (1.575 m)  Wt 196 lb (88.905 kg)  BMI 35.84 kg/m2  General: Well-nourished, well-developed in no acute distress. Positive hoarseness. Eyes: No icterus. Mouth: Oropharyngeal mucosa moist and pink , no lesions erythema or exudate. Lungs: Clear to auscultation  bilaterally.  Heart: Regular rate and rhythm, no murmurs rubs or gallops.  Abdomen: Bowel sounds are normal, nontender, nondistended, no hepatosplenomegaly or masses, no abdominal bruits or hernia , no rebound or guarding.   Extremities: No lower extremity edema. No clubbing or deformities. Neuro: Alert and oriented x 4   Skin: Warm and dry, no jaundice.   Psych: Alert and cooperative, normal mood and affect.

## 2014-01-17 ENCOUNTER — Telehealth: Payer: Self-pay | Admitting: *Deleted

## 2014-01-17 NOTE — Telephone Encounter (Signed)
Looking over my pre-surgery papers.  It said I would need to bring a boot and a walker with me.  If I do, is there any way you can write out a prescription so I can take it to [facility]?   So I can see if Medicaid will cover any of it.  I returned her call.  I informed her she will not be using a boot nor the walker because she chose the option to have the toe amputated.  She asked when she would be able to drive.  I told her he would make that determination at her one week follow up.  I also informed her as long as she's taking the pain medication she will not be able to drive.

## 2014-01-28 DIAGNOSIS — S93119A Dislocation of interphalangeal joint of unspecified toe(s), initial encounter: Secondary | ICD-10-CM

## 2014-01-29 ENCOUNTER — Telehealth: Payer: Self-pay | Admitting: *Deleted

## 2014-01-29 NOTE — Telephone Encounter (Signed)
Need to know how long I keep my foot wrapped until I come back to see him.  I returned her call.  She stated she had surgery yesterday.  I told her to leave the bandage on until her scheduled appointment, we will take it off.  She asked how long does she need to elevate.  I told her to elevate as much as possible to help reduce swelling.  She asked how long does she ice it.  I told her to ice it every hour for a couple of days.

## 2014-01-30 ENCOUNTER — Telehealth: Payer: Self-pay

## 2014-01-30 NOTE — Telephone Encounter (Signed)
Spoke with pt regarding post op status. Pain is being managed effectively with medication. Advise to ice and elevate as much as possible. Pt to keep f/u appt and to call if any issues or concerns arise.

## 2014-02-05 ENCOUNTER — Ambulatory Visit (INDEPENDENT_AMBULATORY_CARE_PROVIDER_SITE_OTHER): Payer: Medicaid Other

## 2014-02-05 VITALS — BP 135/83 | HR 87 | Resp 18 | Ht 62.0 in | Wt 194.0 lb

## 2014-02-05 DIAGNOSIS — S93336A Other dislocation of unspecified foot, initial encounter: Secondary | ICD-10-CM

## 2014-02-05 DIAGNOSIS — Z09 Encounter for follow-up examination after completed treatment for conditions other than malignant neoplasm: Secondary | ICD-10-CM

## 2014-02-05 DIAGNOSIS — M2040 Other hammer toe(s) (acquired), unspecified foot: Secondary | ICD-10-CM

## 2014-02-05 DIAGNOSIS — S93104A Unspecified dislocation of right toe(s), initial encounter: Secondary | ICD-10-CM

## 2014-02-05 NOTE — Patient Instructions (Signed)

## 2014-02-05 NOTE — Progress Notes (Signed)
   Subjective:    Patient ID: Sydney Blair, female    DOB: 08/26/1955, 58 y.o.   MRN: [id number]  HPI Comments: Pt presents for POV1 of Right 2nd toe amputation.  Pt's surgery site is a little pink, but without swelling and drainage.  Pt states she has pain in right 1st and 3rd toes occasionally.     Review of Systems no new findings or systemic changes noted     Objective:   Physical Exam Lower extremity objective findings as follows vascular status is intact pedal pulses palpable bilateral patient is status post amputation second toe right foot incision site clean dry well coapted dressings intact and dry x-rays reveal unremarkable adequate resection of bone at the metatarsal head noted there is no open wounds ulcerations no dehiscence no discharge or drainage no signs of infection noted mild edema and ecchymosis consistent with postop course. Minimal pain or discomfort occasionally some achiness or twinging according to the patient       Assessment & Plan:  Assessment good postop progress following amputation second toe right foot presto compressive dressing reapplied reappointed one week for followup maintain moderate walking activities ambulation maintain Darco shoe at all times reappointed one week plan for suture removal at that time.  Harriet Masson DPM

## 2014-02-12 ENCOUNTER — Ambulatory Visit (INDEPENDENT_AMBULATORY_CARE_PROVIDER_SITE_OTHER): Payer: Medicaid Other

## 2014-02-12 VITALS — BP 116/77 | HR 78 | Resp 17 | Ht 61.0 in | Wt 190.0 lb

## 2014-02-12 DIAGNOSIS — M2040 Other hammer toe(s) (acquired), unspecified foot: Secondary | ICD-10-CM

## 2014-02-12 DIAGNOSIS — S93104D Unspecified dislocation of right toe(s), subsequent encounter: Secondary | ICD-10-CM

## 2014-02-12 DIAGNOSIS — Z09 Encounter for follow-up examination after completed treatment for conditions other than malignant neoplasm: Secondary | ICD-10-CM

## 2014-02-12 DIAGNOSIS — M2041 Other hammer toe(s) (acquired), right foot: Secondary | ICD-10-CM

## 2014-02-12 DIAGNOSIS — Z5189 Encounter for other specified aftercare: Secondary | ICD-10-CM

## 2014-02-12 MED ORDER — HYDROCODONE-ACETAMINOPHEN 10-325 MG PO TABS: 1.0000 | ORAL_TABLET | Freq: Three times a day (TID) | ORAL | Status: DC | PRN

## 2014-02-12 NOTE — Progress Notes (Signed)
   Subjective:    Patient ID: Sydney Blair, female    DOB: 1955/11/07, 58 y.o.   MRN: [id number]  HPI Comments: Pt states she may need some more pain medication, since she continues to have some pain and can't sleep regularly at night.     Review of Systems no new findings or systemic changes noted     Objective:   Physical Exam Neurovascular status is intact pedal pulses palpable epicritic and proprioceptive sensations intact and symmetric is normal plantar response DTRs not elicited patient's incision is well coapted sutures removed at this time Neosporin and Band-Aid dressing are applied maintain a catgut anklet to maintain compression as well patient having a little aching throbbing and aching and posse some mild phantom pain and feels like her toe is still there at times. Remainder of exam unremarkable no dehiscence no discharge no drainage       Assessment & Plan:  Assessment good postop progress following amputation second toe right foot maintain Neosporin and Band-Aid for least a week with daily dressing changes may resume normal bathing and hygiene starting tomorrow maintain Darco shoe for one more week and then can discontinue and return to come for walking tennis or athletic shoe in the future recheck in 3 or 4 weeks for long-term followup. Dispensed additional prescription for some hydrocodone for minor pain on an as-needed basis. Reschedule 3-4 weeks for postop followup and x-ray  Harriet Masson DPM

## 2014-02-21 ENCOUNTER — Ambulatory Visit (INDEPENDENT_AMBULATORY_CARE_PROVIDER_SITE_OTHER): Payer: Medicaid Other | Admitting: Otolaryngology

## 2014-03-12 ENCOUNTER — Ambulatory Visit (INDEPENDENT_AMBULATORY_CARE_PROVIDER_SITE_OTHER): Payer: Medicaid Other

## 2014-03-12 ENCOUNTER — Ambulatory Visit (INDEPENDENT_AMBULATORY_CARE_PROVIDER_SITE_OTHER): Payer: Medicaid Other | Admitting: Podiatry

## 2014-03-12 VITALS — BP 130/76 | HR 90 | Resp 19

## 2014-03-12 DIAGNOSIS — M2010 Hallux valgus (acquired), unspecified foot: Secondary | ICD-10-CM

## 2014-03-12 DIAGNOSIS — M2040 Other hammer toe(s) (acquired), unspecified foot: Secondary | ICD-10-CM

## 2014-03-12 NOTE — Progress Notes (Signed)
   Subjective:    Patient ID: Sydney Blair, female    DOB: 1955-09-14, 58 y.o.   MRN: [id number]  HPI Comments: Pt presents for post-op of right 2nd toe amputation on 01/29/2104.  Pt's right 2nd interdigital space is without redness, and does have a small dry scab, and very little swelling to the foot.  Dr. Jacqualyn Posey states the site looks good on x-ray.  I instructed pt to wear a cotton white footie over the site, leaving off the bandaid due to areas of skin rash from the long-term bandaid wear, and to make an appt for 1 month and call with concerns.     Review of Systems     Objective:   Physical Exam        Assessment & Plan:

## 2014-03-21 ENCOUNTER — Ambulatory Visit (INDEPENDENT_AMBULATORY_CARE_PROVIDER_SITE_OTHER): Payer: Medicaid Other | Admitting: Otolaryngology

## 2014-03-21 DIAGNOSIS — K219 Gastro-esophageal reflux disease without esophagitis: Secondary | ICD-10-CM

## 2014-03-21 DIAGNOSIS — R490 Dysphonia: Secondary | ICD-10-CM

## 2014-04-09 ENCOUNTER — Ambulatory Visit (INDEPENDENT_AMBULATORY_CARE_PROVIDER_SITE_OTHER): Payer: Medicaid Other

## 2014-04-09 VITALS — BP 129/70 | HR 91 | Resp 17

## 2014-04-09 DIAGNOSIS — S93104D Unspecified dislocation of right toe(s), subsequent encounter: Secondary | ICD-10-CM

## 2014-04-09 DIAGNOSIS — G609 Hereditary and idiopathic neuropathy, unspecified: Secondary | ICD-10-CM

## 2014-04-09 DIAGNOSIS — G629 Polyneuropathy, unspecified: Secondary | ICD-10-CM

## 2014-04-09 DIAGNOSIS — Z5189 Encounter for other specified aftercare: Secondary | ICD-10-CM

## 2014-04-09 DIAGNOSIS — Z9889 Other specified postprocedural states: Secondary | ICD-10-CM

## 2014-04-09 DIAGNOSIS — M2040 Other hammer toe(s) (acquired), unspecified foot: Secondary | ICD-10-CM

## 2014-04-09 DIAGNOSIS — M2030 Hallux varus (acquired), unspecified foot: Secondary | ICD-10-CM

## 2014-04-09 NOTE — Progress Notes (Signed)
   Subjective:    Patient ID: Sydney Blair, female    DOB: [birth year], 58 y.o.   MRN: [id number]  HPI  Pt presents as post op 2nd right toe amp, she also states that she has bilateral foot pain/burning sensation, pain has been ongoing for a year and worsening. No relief with resting, activity worsens.   Review of Systems new findings or systemic changes noted     Objective:   Physical Exam Neurovascular status intact and unchanged patient status post amputation second toe right foot 2 months postop doing well no open wound no dehiscence no discharge or drainage no pain or discomfort. 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 Discuss that she would consider and talk with her neurologist about an implantable spinal nerve stimulator to help with her peripheral neuropathy pain. As far as her right foot operative sites well healed and coapted       Assessment & Plan:  Assessment good postop progress following education second toe right return in 3 months for long-term followup x-ray and likely discharge thereafter did suggest considering a spinal nerve stimulator for pain management the future if needed  Harriet Masson DPM

## 2014-04-09 NOTE — Patient Instructions (Signed)

## 2014-04-18 ENCOUNTER — Ambulatory Visit (INDEPENDENT_AMBULATORY_CARE_PROVIDER_SITE_OTHER): Payer: Medicaid Other | Admitting: Otolaryngology

## 2014-04-18 DIAGNOSIS — K219 Gastro-esophageal reflux disease without esophagitis: Secondary | ICD-10-CM

## 2014-04-18 DIAGNOSIS — R490 Dysphonia: Secondary | ICD-10-CM

## 2014-05-30 ENCOUNTER — Ambulatory Visit (INDEPENDENT_AMBULATORY_CARE_PROVIDER_SITE_OTHER): Payer: Medicaid Other | Admitting: Otolaryngology

## 2014-05-30 DIAGNOSIS — R490 Dysphonia: Secondary | ICD-10-CM

## 2014-05-30 DIAGNOSIS — K219 Gastro-esophageal reflux disease without esophagitis: Secondary | ICD-10-CM

## 2014-06-10 ENCOUNTER — Encounter: Payer: Self-pay | Admitting: Internal Medicine

## 2014-07-09 ENCOUNTER — Ambulatory Visit (INDEPENDENT_AMBULATORY_CARE_PROVIDER_SITE_OTHER): Payer: Medicaid Other

## 2014-07-09 VITALS — BP 137/77 | HR 82 | Resp 12

## 2014-07-09 DIAGNOSIS — M2040 Other hammer toe(s) (acquired), unspecified foot: Secondary | ICD-10-CM

## 2014-07-09 DIAGNOSIS — Z9889 Other specified postprocedural states: Secondary | ICD-10-CM

## 2014-07-09 DIAGNOSIS — M2011 Hallux valgus (acquired), right foot: Secondary | ICD-10-CM

## 2014-07-09 DIAGNOSIS — M2041 Other hammer toe(s) (acquired), right foot: Secondary | ICD-10-CM

## 2014-07-09 DIAGNOSIS — G629 Polyneuropathy, unspecified: Secondary | ICD-10-CM

## 2014-07-09 NOTE — Patient Instructions (Signed)
Peripheral Neuropathy Peripheral neuropathy is a type of nerve damage. It affects nerves that carry signals between the spinal cord and other parts of the body. These are called peripheral nerves. With peripheral neuropathy, one nerve or a group of nerves may be damaged.  CAUSES  Many things can damage peripheral nerves. For some people with peripheral neuropathy, the cause is unknown. Some causes include:  Diabetes. This is the most common cause of peripheral neuropathy.  Injury to a nerve.  Pressure or stress on a nerve that lasts a long time.  Too little vitamin B. Alcoholism can lead to this.  Infections.  Autoimmune diseases, such as multiple sclerosis and systemic lupus erythematosus.  Inherited nerve diseases.  Some medicines, such as cancer drugs.  Toxic substances, such as lead and mercury.  Too little blood flowing to the legs.  Kidney disease.  Thyroid disease. SIGNS AND SYMPTOMS  Different people have different symptoms. The symptoms you have will depend on which of your nerves is damaged. Common symptoms include:  Loss of feeling (numbness) in the feet and hands.  Tingling in the feet and hands.  Pain that burns.  Very sensitive skin.  Weakness.  Not being able to move a part of the body (paralysis).  Muscle twitching.  Clumsiness or poor coordination.  Loss of balance.  Not being able to control your bladder.  Feeling dizzy.  Sexual problems. DIAGNOSIS  Peripheral neuropathy is a symptom, not a disease. Finding the cause of peripheral neuropathy can be hard. To figure that out, your health care provider will take a medical history and do a physical exam. A neurological exam will also be done. This involves checking things affected by your brain, spinal cord, and nerves (nervous system). For example, your health care provider will check your reflexes, how you move, and what you can feel.  Other types of tests may also be ordered, such as:  Blood  tests.  A test of the fluid in your spinal cord.  Imaging tests, such as CT scans or an MRI.  Electromyography (EMG). This test checks the nerves that control muscles.  Nerve conduction velocity tests. These tests check how fast messages pass through your nerves.  Nerve biopsy. A small piece of nerve is removed. It is then checked under a microscope. TREATMENT   Medicine is often used to treat peripheral neuropathy. Medicines may include:  Pain-relieving medicines. Prescription or over-the-counter medicine may be suggested.  Antiseizure medicine. This may be used for pain.  Antidepressants. These also may help ease pain from neuropathy.  Lidocaine. This is a numbing medicine. You might wear a patch or be given a shot.  Mexiletine. This medicine is typically used to help control irregular heart rhythms.  Surgery. Surgery may be needed to relieve pressure on a nerve or to destroy a nerve that is causing pain.  Physical therapy to help movement.  Assistive devices to help movement. HOME CARE INSTRUCTIONS   Only take over-the-counter or prescription medicines as directed by your health care provider. Follow the instructions carefully for any given medicines. Do not take any other medicines without first getting approval from your health care provider.  If you have diabetes, work closely with your health care provider to keep your blood sugar under control.  If you have numbness in your feet:  Check every day for signs of injury or infection. Watch for redness, warmth, and swelling.  Wear padded socks and comfortable shoes. These help protect your feet.  Do not do   things that put pressure on your damaged nerve.  Do not smoke. Smoking keeps blood from getting to damaged nerves.  Avoid or limit alcohol. Too much alcohol can cause a lack of B vitamins. These vitamins are needed for healthy nerves.  Develop a good support system. Coping with peripheral neuropathy can be  stressful. Talk to a mental health specialist or join a support group if you are struggling.  Follow up with your health care provider as directed. SEEK MEDICAL CARE IF:   You have new signs or symptoms of peripheral neuropathy.  You are struggling emotionally from dealing with peripheral neuropathy.  You have a fever. SEEK IMMEDIATE MEDICAL CARE IF:   You have an injury or infection that is not healing.  You feel very dizzy or begin vomiting.  You have chest pain.  You have trouble breathing. Document Released: 07/30/2002 Document Revised: 04/21/2011 Document Reviewed: 04/16/2013 [facility] Patient Information 2015 [place], [place]. This information is not intended to replace advice given to you by your health care provider. Make sure you discuss any questions you have with your health care provider.   Recommendations for the chronic pain and peripheral neuropathy would be to discuss with your neurologist the possibilities of using an implantable nerve stimulator, this would be like an internal TENS unit that can be surgically implanted to block pain. Need to talk with your neurologist about the possibility of this helping.

## 2014-07-09 NOTE — Progress Notes (Signed)
   Subjective:    Patient ID: Sydney Blair, female    DOB: May 25, 1956, 58 y.o.   MRN: [id number]  HPI DOS 01/21/14 2ND TOE AMPUTATION.'' THE TOE AREA IS SORE, BURNING, AND HAVE TINGGLING SENSATION.''   Review of Systemsno new findings or systemic changes noted     Objective:   Physical Exam Patient is proxy 5 months status post amputation second toe right foot the site is well healed and coapted she can't wear flip-flops and was advised she should not wear flip-flops. Patient continues to have peripheral neuropathy still taking gabapentin and Cymbalta as prescribed by neurology. 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 #2 patient needs to have persistent peripheral neuropathy and neuritis or neuralgia symptoms did recommend a possible implantable nerve stimulator she will discuss this with her neurologist and pain management people. Discharge to an as-needed basis for any future follow-updispensed proxy 10 minutes discussing and counseling about pain neuropathic pain associated with her history of chemotherapy. Continues to have burning has pain all parts of her body but significantly in her feet as well. Patient will follow-up in the future as needed  Harriet Masson DPM

## 2014-08-01 ENCOUNTER — Encounter: Payer: Self-pay | Admitting: Pulmonary Disease

## 2014-08-01 ENCOUNTER — Ambulatory Visit (INDEPENDENT_AMBULATORY_CARE_PROVIDER_SITE_OTHER): Payer: Medicaid Other | Admitting: Pulmonary Disease

## 2014-08-01 VITALS — BP 120/64 | HR 83 | Temp 98.0°F | Ht 62.0 in | Wt 176.6 lb

## 2014-08-01 DIAGNOSIS — J438 Other emphysema: Secondary | ICD-10-CM

## 2014-08-01 NOTE — Assessment & Plan Note (Signed)
The patient appears to be at a stable baseline from a COPD standpoint. Unfortunately she continues to smoke, and I have told her that her lung disease will be progressive if she is not able to stop. I have asked her to stay on her current bronchodilator regimen, and also to work on weight reduction.

## 2014-08-01 NOTE — Patient Instructions (Signed)
Continue on your current breathing medications. Stop smoking. This is the key to you getting better. Stay as active as possible. followup with me again in 84mos

## 2014-08-01 NOTE — Progress Notes (Signed)
   Subjective:    Patient ID: Sydney Blair, female    DOB: 12-12-1955, 59 y.o.   MRN: [id number]  HPI The patient comes in today for follow-up of her known COPD.  She is staying on her bronchodilator regimen, but unfortunately continues to smoke. She has not had an acute exacerbation since the last visit, and feels that her breathing is at baseline. She has a mild cough probably related to her smoking, but no purulent mucus.   Review of Systems  Constitutional: Negative for fever and unexpected weight change.  HENT: Positive for congestion and rhinorrhea. Negative for dental problem, ear pain, nosebleeds, postnasal drip, sinus pressure, sneezing, sore throat and trouble swallowing.   Eyes: Negative for redness and itching.  Respiratory: Positive for cough, shortness of breath and wheezing. Negative for chest tightness.   Cardiovascular: Negative for palpitations and leg swelling.  Gastrointestinal: Negative for nausea and vomiting.  Genitourinary: Negative for dysuria.  Musculoskeletal: Negative for joint swelling.  Skin: Negative for rash.  Neurological: Negative for headaches.  Hematological: Does not bruise/bleed easily.  Psychiatric/Behavioral: Negative for dysphoric mood. The patient is not nervous/anxious.        Objective:   Physical Exam Obese female in no acute distress Nose without purulence or discharge noted Neck without lymphadenopathy or thyromegaly Chest with fairly good airflow, no rhonchi or wheezes Cardiac exam with regular rate and rhythm Lower extremities with minimal ankle edema, no cyanosis Alert and oriented, moves all 4 extremities.       Assessment & Plan:

## 2014-08-28 ENCOUNTER — Ambulatory Visit: Payer: Medicaid Other | Admitting: Nurse Practitioner

## 2014-09-18 ENCOUNTER — Ambulatory Visit: Payer: Medicaid Other | Admitting: Nurse Practitioner

## 2014-09-19 ENCOUNTER — Ambulatory Visit (INDEPENDENT_AMBULATORY_CARE_PROVIDER_SITE_OTHER): Payer: Medicaid Other | Admitting: Otolaryngology

## 2014-09-30 ENCOUNTER — Ambulatory Visit: Payer: Medicaid Other | Admitting: Nurse Practitioner

## 2014-10-10 ENCOUNTER — Ambulatory Visit (INDEPENDENT_AMBULATORY_CARE_PROVIDER_SITE_OTHER): Payer: Medicaid Other | Admitting: Otolaryngology

## 2014-10-16 ENCOUNTER — Ambulatory Visit: Payer: Medicaid Other | Admitting: Nurse Practitioner

## 2014-10-24 ENCOUNTER — Other Ambulatory Visit (HOSPITAL_COMMUNITY): Payer: Self-pay | Admitting: Internal Medicine

## 2014-10-24 DIAGNOSIS — Z1231 Encounter for screening mammogram for malignant neoplasm of breast: Secondary | ICD-10-CM

## 2014-10-24 DIAGNOSIS — IMO0002 Reserved for concepts with insufficient information to code with codable children: Secondary | ICD-10-CM

## 2014-10-24 DIAGNOSIS — R229 Localized swelling, mass and lump, unspecified: Secondary | ICD-10-CM

## 2014-10-29 ENCOUNTER — Encounter (HOSPITAL_COMMUNITY): Payer: Medicaid Other

## 2014-10-29 ENCOUNTER — Encounter: Payer: Self-pay | Admitting: Nurse Practitioner

## 2014-10-29 ENCOUNTER — Ambulatory Visit (INDEPENDENT_AMBULATORY_CARE_PROVIDER_SITE_OTHER): Payer: Medicaid Other | Admitting: Nurse Practitioner

## 2014-10-29 VITALS — BP 162/89 | HR 83 | Temp 97.4°F | Ht 62.0 in | Wt 180.8 lb

## 2014-10-29 DIAGNOSIS — R490 Dysphonia: Secondary | ICD-10-CM

## 2014-10-29 DIAGNOSIS — R1310 Dysphagia, unspecified: Secondary | ICD-10-CM

## 2014-10-29 DIAGNOSIS — R197 Diarrhea, unspecified: Secondary | ICD-10-CM

## 2014-10-29 DIAGNOSIS — K219 Gastro-esophageal reflux disease without esophagitis: Secondary | ICD-10-CM

## 2014-10-29 MED ORDER — RANITIDINE HCL 300 MG PO TABS: 300.0000 mg | ORAL_TABLET | Freq: Every day | ORAL | Status: DC

## 2014-10-29 NOTE — Assessment & Plan Note (Signed)
Continued symptoms but is still wanting to hold off on GES and esophageal manometry. Food goes down ok, is following gastroparesis diet. Continued marginal and intermittent GERD symptom control. Will add Zantac for better control. Follow-up in 3 months for re-evaluation. Continues gastroparesis diet.

## 2014-10-29 NOTE — Assessment & Plan Note (Signed)
Continued hoarseness, has seen ENT and the patient states they attributed the issue to GERD symptoms. Adding Zantac for better GERD symptoms control. Is scheduled for follow-up with the ENT next week. Return for follow-up in 3 months.

## 2014-10-29 NOTE — Assessment & Plan Note (Signed)
GERD with marginal and intermittent control, on Prilosec 20 mg bid, will add Zantac for attempted better control. Continues to want to hold off on further testing for now. Follow-up in 3 months for re-evaluation.

## 2014-10-29 NOTE — Patient Instructions (Signed)
1. I've increased your Zantac to 300 mg at bedtime 2. Collect the stool samples the next time you have diarrhea and bring them to the lab 3. Start taking a probiotic daily. You can get them over the counter or if you'd prefer it as a prescription, let us know and we can do that 4. Follow-up in 3 months to re-evaluate your symptoms.

## 2014-10-29 NOTE — Progress Notes (Signed)
Referring Provider: [name], MD Primary Care Physician:  Philis Fendt, MD Primary GI:  Dr. Gala Romney  Chief Complaint  Patient presents with  . Follow-up    HPI:   59 year old female presents for 6 month follow-up on previous visit for constipation, GERD, and dysphagia. At that time was determined GERD well controlled on PPI, noted early satiety possible gastroparesis but patietn wanted to hold off on GES and trial gastroparesis diet. Dysphagia no improvement after esophageal dilation and unremarkable gastric biopsies, noted age-related dysmotility on barium swallow 2014, offered esophageal manometry at [facility] which she declined as last visit. Constipation was improved on Linzess at last visit. Hoarseness not improved on bit omeprazole, noted chronic smoker and history of throat cancer, recommended ENT evaluation.  Today she states she's doing about the same. Has been seeing Dr. Melene Plan (ENT) who said her hoarsenes is due to acid reflux, she has not had any improvements but she has a follow-up visit next week. States she's started having diarrhea a couple months ago so she stopped the Linzess and has been taking Imodium with some improvement. Denies abdominal pain. Has occasional/rare N/V. Continued gastroparesis and swallowing difficulty. Is still wanting to hold off on GES and esophageal monometry at this time. Denies hematochezia and melena. States she has marginal and intermittent control with her GERD symptoms. Is currently on Omeprazole 20 mg bid. Has not been trialed on any other anti-reflux medications. When she has diarrhea it is with every bowel movement and multiple times through the day. Has to go immediately after eating. Is described as watery. Denies any other upper or lower GI symptoms.  Past Medical History  Diagnosis Date  . Cancer 2005    breast  . Incontinence   . Sebaceous cyst     lt labial location  . Tobacco abuse   . GERD (gastroesophageal reflux disease)   .  Depression with anxiety   . COPD (chronic obstructive pulmonary disease)   . ARF (acute renal failure) 08/07/2011  . Fibromyalgia   . Peripheral neuropathy   . Chronic back pain   . Sciatic pain     Past Surgical History  Procedure Laterality Date  . Breast lumpectomy      right with node dissection  . Esophagogastroduodenoscopy  11/11/2010    [id number]:[id number] hiatal hernia, 75 French dilator  . Colonoscopy  11/11/2010    [id number]:[id number] rectal polyp and splenic flexure otherwise normal; pathology showed  tubular adenomas. Next TCS 10/2015.  . Bladder tack    . Mastectomy      partial  . Esophagogastroduodenoscopy (egd) with esophageal dilation N/A 06/21/2013    CXK:[id number] esophagus  - status post Maloney dilation s/p bx (benign esophageal bx)    Current Outpatient Prescriptions  Medication Sig Dispense Refill  . albuterol (PROVENTIL HFA;VENTOLIN HFA) 108 (90 BASE) MCG/ACT inhaler Inhale 2 puffs into the lungs every 6 (six) hours as needed for wheezing or shortness of breath.    Marland Kitchen aspirin 81 MG tablet Take 81 mg by mouth daily.    . budesonide-formoterol (SYMBICORT) 160-4.5 MCG/ACT inhaler Inhale 2 puffs into the lungs 2 (two) times daily.    . cetirizine (ZYRTEC) 10 MG tablet Take 10 mg by mouth daily.    . DULoxetine (CYMBALTA) 60 MG capsule Take 60 mg by mouth daily.    Marland Kitchen gabapentin (NEURONTIN) 400 MG capsule Take 600 mg by mouth 3 (three) times daily.     Marland Kitchen levothyroxine (SYNTHROID, LEVOTHROID) 100 MCG tablet Take  100 mcg by mouth daily.    . meclizine (ANTIVERT) 12.5 MG tablet Take 12.5 mg by mouth 2 (two) times daily as needed for dizziness.     . methocarbamol (ROBAXIN) 500 MG tablet Take 500 mg by mouth 4 (four) times daily as needed for muscle spasms.    Marland Kitchen omeprazole (PRILOSEC) 20 MG capsule Take 20 mg by mouth 2 (two) times daily.     . pravastatin (PRAVACHOL) 40 MG tablet Take 40 mg by mouth every evening.    . ranitidine (ZANTAC) 150 MG capsule Take 150 mg by mouth at bedtime.     . traMADol (ULTRAM) 50 MG tablet Take 50 mg by mouth every 6 (six) hours as needed.    . zolpidem (AMBIEN) 5 MG tablet Take 10 mg by mouth at bedtime as needed for sleep.      No current facility-administered medications for this visit.    Allergies as of 10/29/2014 - Review Complete 10/29/2014  Allergen Reaction Noted  . Penicillins Anaphylaxis     Family History  Problem Relation Age of Onset  . Cancer Other   . Heart attack Other   . Lung disease Other     History   Social History  . Marital Status: Married    Spouse Name: N/A  . Number of Children: 1  . Years of Education: N/A   Occupational History  . disabled    Social History Main Topics  . Smoking status: Current Every Day Smoker -- 1.00 packs/day for 35 years    Types: Cigarettes  . Smokeless tobacco: Not on file  . Alcohol Use: No  . Drug Use: No  . Sexual Activity: Yes    Birth Control/ Protection: Post-menopausal     Comment: chemo stopped her periods   Other Topics Concern  . None   Social History Narrative   Lives w. Husband   Lost 1 son-?etiology    Review of Systems: Gen: Denies fever, chills, anorexia. Denies fatigue, weakness, weight loss.  CV: Denies chest pain, palpitations, syncope, peripheral edema, and claudication. Resp: Denies dyspnea at rest, cough, wheezing, coughing up blood, and pleurisy. GI: Denies vomiting blood, jaundice, and fecal incontinence.   Denies dysphagia or odynophagia. Derm: Denies rash, itching, dry skin Psych: Denies depression, anxiety, memory loss, confusion. No homicidal or suicidal ideation.  Heme: Denies bruising, bleeding, and enlarged lymph nodes.  Physical Exam: BP 162/89 mmHg  Pulse 83  Temp(Src) 97.4 F (36.3 C)  Ht 5\' 2"  (1.575 m)  Wt 180 lb 12.8 oz (82.01 kg)  BMI 33.06 kg/m2 General:   Alert and oriented. No distress noted. Pleasant and cooperative.  Head:  Normocephalic and atraumatic. Mouth:  Oral mucosa pink and moist. Good dentition. No  lesions. Lungs:  Clear to auscultation bilaterally. No wheezes, rales, or rhonchi. No distress.  Heart:  S1, S2 present without murmurs, rubs, or gallops. Regular rate and rhythm. Abdomen:  +BS, soft, and non-distended. Mild epigastric TTP. No rebound or guarding. No HSM or masses noted. Msk:  Symmetrical without gross deformities. Normal posture. Extremities:  Without edema. Neurologic:  Alert and  oriented x4;  grossly normal neurologically. Skin:  Intact without significant lesions or rashes. Psych:  Alert and cooperative. Normal mood and affect.    10/29/2014 11:08 AM

## 2014-10-29 NOTE — Assessment & Plan Note (Signed)
Was previously having constipation for which Linzess was effective but in the past month has begun having watery dirrhea without abdominal pain. Has stopped taking Linzess and trying Imodium. Has never had issues with diarrhea before. Will order stool studies to check for infection. Follow-up in 3 months for further evaluation.

## 2014-10-30 NOTE — Progress Notes (Signed)
CC'ED TO PCP 

## 2014-11-01 ENCOUNTER — Telehealth: Payer: Self-pay | Admitting: *Deleted

## 2014-11-01 NOTE — Telephone Encounter (Signed)
"  I need a spacer for between my toes.  Do I need an appointment or can I get one?"  I called and left her a message to schedule an appointment with Dr. Blenda Mounts, to find out what type of spacer would be good for her to use.

## 2014-11-05 ENCOUNTER — Ambulatory Visit (HOSPITAL_COMMUNITY)
Admission: RE | Admit: 2014-11-05 | Discharge: 2014-11-05 | Disposition: A | Discharge status: Home / Self Care | Payer: Medicaid Other | Source: Ambulatory Visit | Attending: Internal Medicine | Admitting: Internal Medicine

## 2014-11-05 DIAGNOSIS — R921 Mammographic calcification found on diagnostic imaging of breast: Secondary | ICD-10-CM | POA: Diagnosis not present

## 2014-11-05 DIAGNOSIS — N641 Fat necrosis of breast: Secondary | ICD-10-CM | POA: Insufficient documentation

## 2014-11-05 DIAGNOSIS — Z853 Personal history of malignant neoplasm of breast: Secondary | ICD-10-CM | POA: Diagnosis not present

## 2014-11-05 DIAGNOSIS — N63 Unspecified lump in breast: Secondary | ICD-10-CM | POA: Diagnosis present

## 2014-11-05 DIAGNOSIS — R229 Localized swelling, mass and lump, unspecified: Secondary | ICD-10-CM

## 2014-11-05 DIAGNOSIS — IMO0002 Reserved for concepts with insufficient information to code with codable children: Secondary | ICD-10-CM

## 2014-11-07 ENCOUNTER — Ambulatory Visit (INDEPENDENT_AMBULATORY_CARE_PROVIDER_SITE_OTHER): Payer: Medicaid Other | Admitting: Otolaryngology

## 2014-11-07 ENCOUNTER — Telehealth: Payer: Self-pay | Admitting: Internal Medicine

## 2014-11-07 DIAGNOSIS — R490 Dysphonia: Secondary | ICD-10-CM | POA: Diagnosis not present

## 2014-11-07 NOTE — Telephone Encounter (Signed)
Pt called this afternoon and said she thought she was suppose to be getting bottles in the mail. I asked her was she talking about a prescription and she started coughing and hung up. Do you know what she's referring too?

## 2014-11-07 NOTE — Telephone Encounter (Signed)
Candy mailed the stool study containers to her last week.

## 2015-01-27 ENCOUNTER — Ambulatory Visit (INDEPENDENT_AMBULATORY_CARE_PROVIDER_SITE_OTHER): Payer: Medicaid Other | Admitting: Pulmonary Disease

## 2015-01-27 ENCOUNTER — Encounter (INDEPENDENT_AMBULATORY_CARE_PROVIDER_SITE_OTHER): Payer: Self-pay

## 2015-01-27 ENCOUNTER — Encounter: Payer: Self-pay | Admitting: Pulmonary Disease

## 2015-01-27 VITALS — BP 118/64 | HR 85 | Temp 98.0°F | Ht 61.0 in | Wt 181.4 lb

## 2015-01-27 DIAGNOSIS — J438 Other emphysema: Secondary | ICD-10-CM

## 2015-01-27 DIAGNOSIS — J84115 Respiratory bronchiolitis interstitial lung disease: Secondary | ICD-10-CM | POA: Diagnosis not present

## 2015-01-27 MED ORDER — TIOTROPIUM BROMIDE MONOHYDRATE 2.5 MCG/ACT IN AERS: 2.0000 | INHALATION_SPRAY | Freq: Every day | RESPIRATORY_TRACT | Status: DC

## 2015-01-27 NOTE — Progress Notes (Signed)
   Subjective:    Patient ID: Sydney Blair, female    DOB: [birth year], 59 y.o.   MRN: [id number]  HPI The patient comes in today for follow-up of her known COPD. Fortunately, she continues to smoke, but feels that her breathing is not far from her usual baseline. She has been taken off Spiriva, and has seen increased cough and mucus production since that time.   Review of Systems  Constitutional: Negative for fever and unexpected weight change.  HENT: Negative for congestion, dental problem, ear pain, nosebleeds, postnasal drip, rhinorrhea, sinus pressure, sneezing, sore throat and trouble swallowing.   Eyes: Negative for redness and itching.  Respiratory: Positive for cough and shortness of breath. Negative for chest tightness and wheezing.   Cardiovascular: Negative for palpitations and leg swelling.  Gastrointestinal: Negative for nausea and vomiting.  Genitourinary: Negative for dysuria.  Musculoskeletal: Negative for joint swelling.  Skin: Negative for rash.  Neurological: Negative for headaches.  Hematological: Does not bruise/bleed easily.  Psychiatric/Behavioral: Negative for dysphoric mood. The patient is not nervous/anxious.        Objective:   Physical Exam Overweight female in no acute distress Nose without purulence or discharge noted Neck without lymphadenopathy or thyromegaly Chest with a few rhonchi, but adequate airflow with mild basilar crackles. Cardiac exam with regular rate and rhythm Lower extremities with no significant edema Alert and oriented, moves all 4 extremities.       Assessment & Plan:

## 2015-01-27 NOTE — Patient Instructions (Signed)
Stay on your symbicort, and add back spiriva respimat 2 inhalations each am everyday Continue to work on smoking cessation. followup with Dr. Lamonte Sakai in 24mo, but call if you are having issues with your breathing.

## 2015-01-27 NOTE — Assessment & Plan Note (Signed)
The patient unfortunately continues to smoke, but at least has not had frequent exacerbation since her last visit. For some reason she was taken off Spiriva, and has seen increasing cough and mucus production since that time. I will get her back on Spiriva respiratory, and I've asked her to stay on Symbicort as well. She has her rescue inhaler for rescue only. Finally, I have stressed to her that she must stop smoking if she wishes to stay functional.

## 2015-01-29 ENCOUNTER — Encounter: Payer: Self-pay | Admitting: Nurse Practitioner

## 2015-01-29 ENCOUNTER — Ambulatory Visit (INDEPENDENT_AMBULATORY_CARE_PROVIDER_SITE_OTHER): Payer: Medicaid Other | Admitting: Nurse Practitioner

## 2015-01-29 ENCOUNTER — Telehealth: Payer: Self-pay

## 2015-01-29 VITALS — BP 137/88 | HR 82 | Temp 97.3°F | Ht 61.0 in | Wt 182.8 lb

## 2015-01-29 DIAGNOSIS — K219 Gastro-esophageal reflux disease without esophagitis: Secondary | ICD-10-CM | POA: Diagnosis not present

## 2015-01-29 DIAGNOSIS — R1310 Dysphagia, unspecified: Secondary | ICD-10-CM | POA: Diagnosis not present

## 2015-01-29 DIAGNOSIS — R490 Dysphonia: Secondary | ICD-10-CM | POA: Diagnosis not present

## 2015-01-29 NOTE — Patient Instructions (Signed)
1. Continue taking her acid blocking medicines. 2. Follow-up with ENT 6 and continue to evaluate your hoarseness. 3. Return here for follow-up if needed for any change in symptoms or worsening symptoms.

## 2015-01-29 NOTE — Telephone Encounter (Signed)
I spoke with the patient and she said everything was fine and she will call back if she needs a follow-up appt.

## 2015-01-29 NOTE — Telephone Encounter (Signed)
Pt called office and stated that she was not happy at her visit with [place] today. States that she hasn't been happy the last 2 visits.  States that she had to wait for an hour to be seen and that she was not happy with the NP either. I placed pt on hold so she could speak with the office manager but pt hung up. Sydney Blair is aware of pt complaint.

## 2015-01-29 NOTE — Progress Notes (Signed)
Referring Provider: [name], MD Primary Care Physician:  Philis Fendt, MD Primary GI:  Dr. Gala Romney  Chief Complaint  Patient presents with  . Follow-up    HPI:   59 year old female presents for follow-up on GERD symptoms. At last visit she was having marginal control on Prilosec 20 mg twice a day. We added Zantac daily for improved baseline control. At that time the patient was not wanting to proceed with any other workup.  Today she states her GERD symptoms are about the same. Deneis chest pain, esophageal burning. Only symptoms is hoarseness. Is seeing ENT and at last visit the patient stated they thought her symptoms were due to GERD. Now she states they're not sure. Overall she states her GERD isn't bad currently "it doesn't wake me up at night or anything." Has continued solid food and pill dysphagia. Underwent MBSS with Speech Pathology 2014 who felt she had mild oral phase dysphagia, mild pharyngeal phase dysphagia, suspected primary esophageal dysphagia. EGD with dilation in 2014 did not improve symptoms. Denies chest pain, dyspnea, dizziness, lightheadedness, syncope, near syncope. Denies any other upper or lower GI symptoms. She continues to decline further evaluation/workup at [facility] (ie- monometry).   Past Medical History  Diagnosis Date  . Cancer 2005    breast  . Incontinence   . Sebaceous cyst     lt labial location  . Tobacco abuse   . GERD (gastroesophageal reflux disease)   . Depression with anxiety   . COPD (chronic obstructive pulmonary disease)   . ARF (acute renal failure) 08/07/2011  . Fibromyalgia   . Peripheral neuropathy   . Chronic back pain   . Sciatic pain     Past Surgical History  Procedure Laterality Date  . Breast lumpectomy      right with node dissection  . Esophagogastroduodenoscopy  11/11/2010    OZD:GUYQI hiatal hernia, 65 French dilator  . Colonoscopy  11/11/2010    [id number] rectal polyp and splenic flexure otherwise  normal; pathology showed  tubular adenomas. Next TCS 10/2015.  . Bladder tack    . Mastectomy      partial  . Esophagogastroduodenoscopy (egd) with esophageal dilation N/A 06/21/2013    IEP:PIRJJO esophagus  - status post Maloney dilation s/p bx (benign esophageal bx)    Current Outpatient Prescriptions  Medication Sig Dispense Refill  . albuterol (PROVENTIL HFA;VENTOLIN HFA) 108 (90 BASE) MCG/ACT inhaler Inhale 2 puffs into the lungs every 6 (six) hours as needed for wheezing or shortness of breath.    Marland Kitchen aspirin 81 MG tablet Take 81 mg by mouth daily.    . budesonide-formoterol (SYMBICORT) 160-4.5 MCG/ACT inhaler Inhale 2 puffs into the lungs 2 (two) times daily.    . cetirizine (ZYRTEC) 10 MG tablet Take 10 mg by mouth daily.    . DULoxetine (CYMBALTA) 60 MG capsule Take 60 mg by mouth daily.    . fluticasone (FLONASE) 50 MCG/ACT nasal spray Place 2 sprays into both nostrils daily.    Marland Kitchen gabapentin (NEURONTIN) 400 MG capsule Take 600 mg by mouth 4 (four) times daily.     Marland Kitchen levothyroxine (SYNTHROID, LEVOTHROID) 100 MCG tablet Take 100 mcg by mouth daily.    . meclizine (ANTIVERT) 12.5 MG tablet Take 12.5 mg by mouth 2 (two) times daily as needed for dizziness.     . meloxicam (MOBIC) 15 MG tablet Take 15 mg by mouth daily.    . methocarbamol (ROBAXIN) 500 MG tablet Take 500 mg by mouth 4 (  four) times daily as needed for muscle spasms.    Marland Kitchen omeprazole (PRILOSEC) 20 MG capsule Take 20 mg by mouth 2 (two) times daily.     . pravastatin (PRAVACHOL) 40 MG tablet Take 40 mg by mouth every evening.    . ranitidine (ZANTAC) 300 MG tablet Take 1 tablet (300 mg total) by mouth at bedtime. (Patient taking differently: Take 150 mg by mouth at bedtime. ) 30 tablet 5  . Tiotropium Bromide Monohydrate (SPIRIVA RESPIMAT) 2.5 MCG/ACT AERS Inhale 2 puffs into the lungs daily. 4 g 4  . traMADol (ULTRAM) 50 MG tablet Take 50 mg by mouth every 6 (six) hours as needed.    . Vortioxetine HBr 10 MG TABS Take by  mouth daily.    Marland Kitchen zolpidem (AMBIEN) 5 MG tablet Take 10 mg by mouth at bedtime as needed for sleep.      No current facility-administered medications for this visit.    Allergies as of 01/29/2015 - Review Complete 01/29/2015  Allergen Reaction Noted  . Penicillins Anaphylaxis     Family History  Problem Relation Age of Onset  . Cancer Other   . Heart attack Other   . Lung disease Other     History   Social History  . Marital Status: Married    Spouse Name: N/A  . Number of Children: 1  . Years of Education: N/A   Occupational History  . disabled    Social History Main Topics  . Smoking status: Current Every Day Smoker -- 1.00 packs/day for 35 years    Types: Cigarettes  . Smokeless tobacco: Not on file  . Alcohol Use: No  . Drug Use: No  . Sexual Activity: Yes    Birth Control/ Protection: Post-menopausal     Comment: chemo stopped her periods   Other Topics Concern  . None   Social History Narrative   Lives w. Husband   Lost 1 son-?etiology    Review of Systems: General: Negative for anorexia, weight loss, fever, chills, fatigue, weakness. Eyes: Negative for vision changes.  CV: Negative for chest pain, angina, palpitations, peripheral edema.  Respiratory: Negative for dyspnea at rest, cough, wheezing.  GI: See history of present illness. Derm: Negative for rash or itching.  Endo: Negative for unusual weight change.  Heme: Negative for bruising or bleeding. Allergy: Negative for rash or hives.   Physical Exam: BP 137/88 mmHg  Pulse 82  Temp(Src) 97.3 F (36.3 C)  Ht '5\' 1"'$  (1.549 m)  Wt 182 lb 12.8 oz (82.918 kg)  BMI 34.56 kg/m2 General:   Alert and oriented. Pleasant and cooperative. Well-nourished and well-developed.  Head:  Normocephalic and atraumatic. Eyes:  Without icterus, sclera clear and conjunctiva pink.  Ears:  Normal auditory acuity. Cardiovascular:  S1, S2 present without murmurs appreciated. Extremities without clubbing or  edema. Respiratory:  Clear to auscultation bilaterally. No wheezes, rales, or rhonchi. No distress.  Gastrointestinal:  +BS, soft, non-tender and non-distended. No HSM noted. No guarding or rebound. No masses appreciated.  Rectal:  Deferred  Musculoskalatal:  Symmetrical without gross deformities. Normal posture. Psych:  Alert and cooperative. Normal mood and affect. Heme/Lymph/Immune: No significant cervical adenopathy. No excessive bruising noted.    01/29/2015 11:05 AM

## 2015-01-30 ENCOUNTER — Telehealth: Payer: Self-pay | Admitting: Pulmonary Disease

## 2015-01-30 NOTE — Telephone Encounter (Signed)
Called and spoke to [employer] and was advised pt's Spiriva respimat needs a PA. However, the Spiriva Handihaler is covered. Called and spoke to pt. Informed her of the PA. Pt stated she has used the handihaler before and it worked well for her.   [place] please advise if you want to proceed with PA for respimat or order the handihaler. Thanks.

## 2015-01-31 MED ORDER — TIOTROPIUM BROMIDE MONOHYDRATE 18 MCG IN CAPS: 18.0000 ug | ORAL_CAPSULE | Freq: Every day | RESPIRATORY_TRACT | Status: AC

## 2015-01-31 NOTE — Telephone Encounter (Signed)
[place] is covered by insurance.  Do you want to proceed with Respimat or order handihaler?  Please advise.

## 2015-01-31 NOTE — Telephone Encounter (Signed)
handihaler is fine if it does not require PA

## 2015-01-31 NOTE — Telephone Encounter (Signed)
Called and spoke to pt. Informed her of the change. Rx sent to preferred pharmacy. Pt verbalized understanding and denied any further questions or concerns at this time.

## 2015-02-03 ENCOUNTER — Ambulatory Visit: Payer: Medicaid Other | Admitting: Pulmonary Disease

## 2015-02-03 ENCOUNTER — Telehealth: Payer: Self-pay | Admitting: *Deleted

## 2015-02-03 NOTE — Telephone Encounter (Signed)
Per last OV pt to f/u with RB in 6 months, That will be her new provider

## 2015-02-03 NOTE — Telephone Encounter (Signed)
Patient has [place] Medicaid/[employer] PA are paper fax and must be signed by provided. Since [facility] is no longer with [facility]. What to do?  [facility] is pharmacy.  Spiriva is non-formulary.  Pt KZ#[phone] N

## 2015-02-03 NOTE — Telephone Encounter (Signed)
Dr. Gwenette Greet had already ok for patient to switch to Spiriva handihaler since no PA was required. Pt has already p/u inhaler. Nothing further needed at this time.

## 2015-02-04 NOTE — Assessment & Plan Note (Signed)
Patient with continued hoarseness, has a follow-up scheduled with ENT. At last visit ENT said they weren't sure if it was GERD causing her symptoms or not and he wanted to further evaluate her. At this point patient is declining further GI evaluation as she has been offered previously. She reiterated [place] today. Continue PPI and Zantac, return as needed for any worsening or recurrent symptoms.

## 2015-02-04 NOTE — Assessment & Plan Note (Signed)
Patient with continued GERD symptoms although, per her description, these seem tolerable. Continue with PPI and Zantac as previously ordered. Return as needed for worsening or recurrent symptoms or she changes her mind about further workup. She has declined, and continues to decline, further workup at this point.

## 2015-02-04 NOTE — Assessment & Plan Note (Signed)
Patient with dysphagia, likely from GERD symptoms or possible ENT etiology. Modified barium swallow study with speech therapy in 2014 with mild oral phase dysphagia, mild pharyngeal phase dysphagia, suspected primary esophageal dysphagia. She underwent endoscopy dilation 2014 which she states did not improve her symptoms. She is continued to decline further evaluation and workup including referral to [facility]. At this point we'll have her return as needed for worsening or continued symptoms. Keep follow-up 0.0 with ENT

## 2015-02-05 NOTE — Progress Notes (Signed)
cc'd to pcp 

## 2015-02-06 ENCOUNTER — Ambulatory Visit (INDEPENDENT_AMBULATORY_CARE_PROVIDER_SITE_OTHER): Payer: Medicaid Other | Admitting: Otolaryngology

## 2015-02-06 DIAGNOSIS — R490 Dysphonia: Secondary | ICD-10-CM | POA: Diagnosis not present

## 2015-02-06 DIAGNOSIS — K219 Gastro-esophageal reflux disease without esophagitis: Secondary | ICD-10-CM | POA: Diagnosis not present

## 2015-04-23 ENCOUNTER — Emergency Department (HOSPITAL_COMMUNITY)
Admission: EM | Admit: 2015-04-23 | Discharge: 2015-04-23 | Disposition: A | Discharge status: Home / Self Care | Payer: Medicaid Other

## 2015-04-23 NOTE — ED Notes (Signed)
Pt not in waiting room

## 2015-04-23 NOTE — ED Notes (Signed)
Called for pt x 2, pt not in waiting room.

## 2015-04-23 NOTE — ED Notes (Signed)
Called for pt x 1.

## 2015-07-30 ENCOUNTER — Encounter: Payer: Self-pay | Admitting: Gastroenterology

## 2015-07-30 ENCOUNTER — Ambulatory Visit (INDEPENDENT_AMBULATORY_CARE_PROVIDER_SITE_OTHER): Payer: Medicaid Other | Admitting: Gastroenterology

## 2015-07-30 VITALS — BP 124/87 | HR 86 | Temp 97.2°F | Ht 61.0 in | Wt 176.4 lb

## 2015-07-30 DIAGNOSIS — K591 Functional diarrhea: Secondary | ICD-10-CM

## 2015-07-30 DIAGNOSIS — K219 Gastro-esophageal reflux disease without esophagitis: Secondary | ICD-10-CM

## 2015-07-30 DIAGNOSIS — K589 Irritable bowel syndrome without diarrhea: Secondary | ICD-10-CM | POA: Diagnosis not present

## 2015-07-30 MED ORDER — DICYCLOMINE HCL 10 MG PO CAPS: 10.0000 mg | ORAL_CAPSULE | Freq: Three times a day (TID) | ORAL | Status: DC

## 2015-07-30 MED ORDER — PANTOPRAZOLE SODIUM 40 MG PO TBEC: 40.0000 mg | DELAYED_RELEASE_TABLET | Freq: Two times a day (BID) | ORAL | Status: DC

## 2015-07-30 NOTE — Progress Notes (Signed)
CC'ED TO PCP 

## 2015-07-30 NOTE — Assessment & Plan Note (Signed)
Patient reports diarrhea returned earlier this year when she switched from Lyrica to Neurontin. Used to have diarrhea with fecal urgency years ago until she started Lyrica. Actually had to be treated for constipation last year. Symptoms associated with urgency and fecal incontinence. Discussed doing stool studies that have been planned previously which patient wants to avoid. Discussed empirical treatment with Flagyl but she is not interested. We will proceed IBS treatment and go from there. Start Bentyl 10 mg every before meals daily at bedtime hold for constipation. She'll call in a few weeks with a progress report. We have requested most recent labs from PCP for review.

## 2015-07-30 NOTE — Assessment & Plan Note (Signed)
Chronic GERD with breakthrough symptoms on current regimen. Continues to have intermittent hoarseness for which she is seeing the ENT previously and they have questioned acid reflux as the etiology. At this time patient would like to try a different PPI which I think is reasonable. Will start pantoprazole 40 mg twice daily before meals. She will call in a few weeks and let us know how she is doing. Advised that it may take a couple of months for her to notice dramatic improvement in her hoarseness if it is related to reflux. I am concerned that some of it is related to her chronic smoking.

## 2015-07-30 NOTE — Progress Notes (Signed)
Primary Care Physician: Philis Fendt, MD  Primary Gastroenterologist:  Garfield Cornea, MD   Chief Complaint  Patient presents with  . Diarrhea    HPI: Sydney Blair is a 59 y.o. female here for follow-up visit. She made the appointment for diarrhea. We last saw her in June. She has a history of chronic GERD, question LPR, recurrent diarrhea.  Patient states that she used to have diarrhea with postprandial urgency years ago. Symptoms resolved when she took Lyrica which she was on chronically up until earlier this year. She was switched to Neurontin. After that point her diarrhea returned. She is having 4-5 stools per day. Complains of fecal urgency and incontinence. If she coughs she has a bowel movement. This can happen frequently. Occurs a lot of times while she is asleep. Wakes up in the morning having been incontinent without her knowledge. Denies any melena rectal bleeding. Rarely has any abdominal pain. Appetite remains good. No unintentional weight loss. Denies any recent antibiotics. No well water. No ill contacts or travel abroad. No camping. Back in March we had recommended stool studies the patient states she never received the bottles in the mail. lyrica caused constiaption and diarrhea resolved. Several months ago neurtonin instead and diarrhea back. With regards to her heartburn, she continues to have breakthrough symptoms. Her hoarseness is intermittent. Denies any dysphagia. Only tried on omeprazole in the past along with ranitidine.    Current Outpatient Prescriptions  Medication Sig Dispense Refill  . albuterol (PROVENTIL HFA;VENTOLIN HFA) 108 (90 BASE) MCG/ACT inhaler Inhale 2 puffs into the lungs every 6 (six) hours as needed for wheezing or shortness of breath.    Marland Kitchen aspirin 81 MG tablet Take 81 mg by mouth daily.    . budesonide-formoterol (SYMBICORT) 160-4.5 MCG/ACT inhaler Inhale 2 puffs into the lungs 2 (two) times daily.    . cetirizine (ZYRTEC) 10 MG  tablet Take 10 mg by mouth daily.    . DULoxetine (CYMBALTA) 60 MG capsule Take 60 mg by mouth daily.    . fluticasone (FLONASE) 50 MCG/ACT nasal spray Place 2 sprays into both nostrils daily.    Marland Kitchen gabapentin (NEURONTIN) 400 MG capsule Take 600 mg by mouth 4 (four) times daily.     Marland Kitchen levothyroxine (SYNTHROID, LEVOTHROID) 100 MCG tablet Take 100 mcg by mouth daily.    . meclizine (ANTIVERT) 12.5 MG tablet Take 12.5 mg by mouth 2 (two) times daily as needed for dizziness.     . meloxicam (MOBIC) 15 MG tablet Take 15 mg by mouth daily.    . methocarbamol (ROBAXIN) 500 MG tablet Take 500 mg by mouth 4 (four) times daily as needed for muscle spasms.    Marland Kitchen omeprazole (PRILOSEC) 20 MG capsule Take 20 mg by mouth 2 (two) times daily.     . pravastatin (PRAVACHOL) 40 MG tablet Take 40 mg by mouth every evening.    . ranitidine (ZANTAC) 300 MG tablet Take 1 tablet (300 mg total) by mouth at bedtime. (Patient taking differently: Take 150 mg by mouth at bedtime. ) 30 tablet 5  . tiotropium (SPIRIVA) 18 MCG inhalation capsule Place 1 capsule (18 mcg total) into inhaler and inhale daily. 30 capsule 6  . traMADol (ULTRAM) 50 MG tablet Take 50 mg by mouth every 6 (six) hours as needed.    . zolpidem (AMBIEN) 5 MG tablet Take 10 mg by mouth at bedtime as needed for sleep.     . Vortioxetine HBr 10 MG  TABS Take by mouth daily.     No current facility-administered medications for this visit.    Allergies as of 07/30/2015 - Review Complete 07/30/2015  Allergen Reaction Noted  . Penicillins Anaphylaxis     ROS:  General: Negative for anorexia, weight loss, fever, chills, fatigue, weakness. ENT: Negative for hoarseness, difficulty swallowing , nasal congestion. CV: Negative for chest pain, angina, palpitations, dyspnea on exertion, peripheral edema.  Respiratory: Negative for dyspnea at rest, dyspnea on exertion, cough, sputum, wheezing.  GI: See history of present illness. GU:  Negative for dysuria,  hematuria, urinary incontinence, urinary frequency, nocturnal urination.  Endo: Negative for unusual weight change.    Physical Examination:   BP 124/87 mmHg  Pulse 86  Temp(Src) 97.2 F (36.2 C)  Ht '5\' 1"'$  (1.549 m)  Wt 176 lb 6.4 oz (80.015 kg)  BMI 33.35 kg/m2  General: Well-nourished, well-developed in no acute distress.  Eyes: No icterus. Mouth: Oropharyngeal mucosa moist and pink , no lesions erythema or exudate. Lungs: Clear to auscultation bilaterally.  Heart: Regular rate and rhythm, no murmurs rubs or gallops.  Abdomen: Bowel sounds are normal, nontender, nondistended, no hepatosplenomegaly or masses, no abdominal bruits or hernia , no rebound or guarding.   Extremities: No lower extremity edema. No clubbing or deformities. Neuro: Alert and oriented x 4   Skin: Warm and dry, no jaundice.   Psych: Alert and cooperative, normal mood and affect.  Labs:  Requested last labs from PCP Imaging Studies: No results found.

## 2015-07-30 NOTE — Patient Instructions (Signed)
1. Start dicyclomine one capsule before each meal and at bed night for loose stools. Hold for constipation. Prescription sent to pharmacy. 2. Stop omeprazole. Begin pantoprazole 40 mg twice daily before breakfast and evening meal. Prescription sent to pharmacy. 3. Call in a few weeks and let me know how you are doing or sooner if needed.

## 2015-09-02 ENCOUNTER — Ambulatory Visit: Payer: Medicaid Other | Admitting: Emergency Medicine

## 2015-09-03 ENCOUNTER — Telehealth: Payer: Self-pay | Admitting: Internal Medicine

## 2015-09-03 ENCOUNTER — Encounter: Payer: Self-pay | Admitting: Internal Medicine

## 2015-09-03 MED ORDER — METRONIDAZOLE 500 MG PO TABS: 500.0000 mg | ORAL_TABLET | Freq: Three times a day (TID) | ORAL | Status: DC

## 2015-09-03 NOTE — Telephone Encounter (Signed)
Pt is aware. Please schedule ov.  

## 2015-09-03 NOTE — Telephone Encounter (Signed)
Routing to [facility] 

## 2015-09-03 NOTE — Telephone Encounter (Signed)
APPT MADE AND LETTER SENT  °

## 2015-09-03 NOTE — Addendum Note (Signed)
Addended by: Mahala Menghini on: 09/03/2015 02:17 PM   Modules accepted: Orders

## 2015-09-03 NOTE — Telephone Encounter (Signed)
PATIENT CALLED AND STATED THAT THE DICYCLOMINE IS NOT WORKING FOR THE DIARRHEA.  PLEASE ADVISE [phone]

## 2015-09-03 NOTE — Telephone Encounter (Signed)
Let's try Flagyl to cover for possible infectious etiology given patient declined stool studies. RX sent. Return to the office in 4 weeks. We can proceed with TCS at that time (due for surveillance of polyps).

## 2015-09-25 ENCOUNTER — Other Ambulatory Visit: Payer: Self-pay | Admitting: Gastroenterology

## 2015-10-07 ENCOUNTER — Ambulatory Visit: Payer: Medicaid Other | Admitting: Gastroenterology

## 2015-10-20 ENCOUNTER — Ambulatory Visit: Payer: Medicaid Other | Admitting: Emergency Medicine

## 2015-11-11 ENCOUNTER — Ambulatory Visit (INDEPENDENT_AMBULATORY_CARE_PROVIDER_SITE_OTHER): Payer: Medicaid Other

## 2015-11-11 ENCOUNTER — Ambulatory Visit (INDEPENDENT_AMBULATORY_CARE_PROVIDER_SITE_OTHER): Payer: Medicaid Other | Admitting: Sports Medicine

## 2015-11-11 ENCOUNTER — Encounter: Payer: Self-pay | Admitting: Sports Medicine

## 2015-11-11 DIAGNOSIS — Z89421 Acquired absence of other right toe(s): Secondary | ICD-10-CM

## 2015-11-11 DIAGNOSIS — S93601A Unspecified sprain of right foot, initial encounter: Secondary | ICD-10-CM

## 2015-11-11 DIAGNOSIS — M2011 Hallux valgus (acquired), right foot: Secondary | ICD-10-CM

## 2015-11-11 DIAGNOSIS — M79671 Pain in right foot: Secondary | ICD-10-CM

## 2015-11-11 DIAGNOSIS — R600 Localized edema: Secondary | ICD-10-CM

## 2015-11-11 DIAGNOSIS — G588 Other specified mononeuropathies: Secondary | ICD-10-CM

## 2015-11-11 NOTE — Progress Notes (Signed)
Patient ID: [name], female   DOB: 09/26/1955, 60 y.o.   MRN: [id number] Subjective: Sydney Blair is a 60 y.o. female patient who presents to office for evaluation of right foot pain. Patient complains of progressive pain especially over the last 1-2 weeks at the right 4-5 toes and along side of her foot; states that she could have sprained it but is not sure; states that it feels better with ice with is still painful and swollen. Patient denies any other pedal complaints.  Patient Active Problem List   Diagnosis Date Noted  . IBS (irritable bowel syndrome) 07/30/2015  . Diarrhea 10/29/2014  . Hoarseness 10/29/2014  . Chronic hoarseness 01/16/2014  . Other dysphagia 01/16/2014  . Dysphagia, unspecified(787.20) 06/04/2013  . Unspecified constipation 06/04/2013  . Unsteady gait 01/25/2013  . COPD (chronic obstructive pulmonary disease) with emphysema ([place]) 12/26/2012  . DOE (dyspnea on exertion) 12/01/2012  . 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 Dysphagia 11/09/2012  . COPD exacerbation ([place]) 08/07/2011  . Hypokalemia 08/07/2011  . Myositis 08/07/2011  . Dehydration 08/07/2011  . ARF (acute renal failure) ([place]) 08/07/2011  . Tubular adenoma 07/20/2011  . INSOMNIA 02/26/2008  . CERUMEN IMPACTION, RIGHT 01/23/2008  . 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 NECK PAIN 11/22/2007  . HAMMER TOE 11/22/2007  . ANXIETY 01/30/2007  . TOBACCO ABUSE 01/30/2007  . DEPRESSION 01/30/2007  . GERD 01/30/2007  . PROLAPSE, VAGINAL WALL, CYSTOCELE, MIDLINE 01/10/2007  . INCONTINENCE 01/10/2007  . SEBACEOUS CYST 08/17/2006  . ADENOCARCINOMA, BREAST, RIGHT 12/01/2003    Current Outpatient Prescriptions on File Prior to Visit  Medication Sig Dispense Refill  . albuterol (PROVENTIL HFA;VENTOLIN HFA) 108 (90 BASE) MCG/ACT inhaler Inhale 2 puffs into the lungs every 6 (six) hours as needed for wheezing or shortness of breath.    Marland Kitchen  aspirin 81 MG tablet Take 81 mg by mouth daily.    . budesonide-formoterol (SYMBICORT) 160-4.5 MCG/ACT inhaler Inhale 2 puffs into the lungs 2 (two) times daily.    . cetirizine (ZYRTEC) 10 MG tablet Take 10 mg by mouth daily.    Marland Kitchen dicyclomine (BENTYL) 10 MG capsule TAKE ONE CAPSULE BY MOUTH FOUR TIMES DAILY BEFORE MEALS AND AT BEDTIME. HOLD FOR CONSTIPATION 120 capsule 1  . DULoxetine (CYMBALTA) 60 MG capsule Take 60 mg by mouth daily.    . fluticasone (FLONASE) 50 MCG/ACT nasal spray Place 2 sprays into both nostrils daily.    Marland Kitchen gabapentin (NEURONTIN) 400 MG capsule Take 600 mg by mouth 4 (four) times daily.     Marland Kitchen levothyroxine (SYNTHROID, LEVOTHROID) 100 MCG tablet Take 100 mcg by mouth daily.    . meclizine (ANTIVERT) 12.5 MG tablet Take 12.5 mg by mouth 2 (two) times daily as needed for dizziness.     . meloxicam (MOBIC) 15 MG tablet Take 15 mg by mouth daily.    . methocarbamol (ROBAXIN) 500 MG tablet Take 500 mg by mouth 4 (four) times daily as needed for muscle spasms.    . metroNIDAZOLE (FLAGYL) 500 MG tablet Take 1 tablet (500 mg total) by mouth 3 (three) times daily. 30 tablet 0  . pantoprazole (PROTONIX) 40 MG tablet Take 1 tablet (40 mg total) by mouth 2 (two) times daily before a meal. 60 tablet 5  . pravastatin (PRAVACHOL) 40 MG tablet Take 40 mg by mouth every evening.    . ranitidine (ZANTAC) 300 MG tablet Take 1 tablet (300 mg total) by mouth at bedtime. (Patient taking differently:  Take 150 mg by mouth at bedtime. ) 30 tablet 5  . tiotropium (SPIRIVA) 18 MCG inhalation capsule Place 1 capsule (18 mcg total) into inhaler and inhale daily. 30 capsule 6  . traMADol (ULTRAM) 50 MG tablet Take 50 mg by mouth every 6 (six) hours as needed.    . Vortioxetine HBr 10 MG TABS Take by mouth daily.    Marland Kitchen zolpidem (AMBIEN) 5 MG tablet Take 10 mg by mouth at bedtime as needed for sleep.      No current facility-administered medications on file prior to visit.    Allergies  Allergen  Reactions  . Penicillins Anaphylaxis    Objective:  General: Alert and oriented x3 in no acute distress  Dermatology: No open lesions bilateral lower extremities, no webspace macerations, no ecchymosis bilateral, all nails x 10 are short and asymptomatic. Feet are dirty on the bottom with evidence of walking barefoot.   Vascular: Dorsalis Pedis and Posterior Tibial pedal pulses palpable, Capillary Fill Time 3 seconds,(-) pedal hair growth bilateral, mild focal edema to dorsolateral right foot, Temperature gradient within normal limits.  Neurology: Johney Maine sensation intact via light touch bilateral, Protective sensation diminished with Semmes Weinstein Monofilament to all pedal sites, vibratory dimished bilateral.   Musculoskeletal: Mild tenderness with palpation at 5th>4th ray on right foot, Amputation status of 2nd toe, Significant bunion and hammertoe deformity vs adaptive changes after toe amp on right, No pain with calf compression bilateral. Strength within normal limits in all groups bilateral.   Xrays  Right Foot   Impression: Normal osseous mineralization, bunion, amputation status of 2nd toe, hammertoe, no obvious fracture/disclocation, mild soft tissue swelling, no other acute findings.  Assessment and Plan: Problem List Items Addressed This Visit    None    Visit Diagnoses    Right foot pain    -  Primary    Relevant Orders    DG Foot 2 Views Right    Sprain of foot, right, initial encounter        Other mononeuropathy        Status post amputation of toe of right foot (HCC)        2nd    Localized edema          -Complete examination performed -Xrays reviewed -Discussed treatement options for likely sprain complicated by neuropathy -Applied unna boot to right foot to keep clean, dry, and intact for 1 week; recommend to purchase post op shoe or to use a stiff sole shoe will unna boot is on. Patient may remove after 1 week -Cont with icing, protection, and elevation as  needed for pain relief -Patient to return to office in 2 weeks will consider re-xray to see if stress fracture is evident however not present at this encouter vs steroid injection if swelling/inflammation persists or sooner if condition worsens.  Landis Martins, DPM

## 2015-11-18 ENCOUNTER — Encounter: Payer: Self-pay | Admitting: Internal Medicine

## 2015-11-19 ENCOUNTER — Other Ambulatory Visit: Payer: Self-pay | Admitting: Nurse Practitioner

## 2015-11-25 ENCOUNTER — Ambulatory Visit: Payer: Medicaid Other | Admitting: Sports Medicine

## 2015-12-09 ENCOUNTER — Ambulatory Visit: Payer: Medicaid Other | Admitting: Emergency Medicine

## 2016-01-22 ENCOUNTER — Other Ambulatory Visit (HOSPITAL_COMMUNITY): Payer: Self-pay | Admitting: Internal Medicine

## 2016-01-22 DIAGNOSIS — Z1231 Encounter for screening mammogram for malignant neoplasm of breast: Secondary | ICD-10-CM

## 2016-01-28 ENCOUNTER — Ambulatory Visit (HOSPITAL_COMMUNITY): Payer: Medicaid Other

## 2016-01-28 ENCOUNTER — Ambulatory Visit (HOSPITAL_COMMUNITY)
Admission: RE | Admit: 2016-01-28 | Discharge: 2016-01-28 | Disposition: A | Discharge status: Home / Self Care | Payer: Medicaid Other | Source: Ambulatory Visit | Attending: Internal Medicine | Admitting: Internal Medicine

## 2016-01-28 DIAGNOSIS — Z1231 Encounter for screening mammogram for malignant neoplasm of breast: Secondary | ICD-10-CM | POA: Insufficient documentation

## 2016-02-02 ENCOUNTER — Other Ambulatory Visit: Payer: Self-pay | Admitting: Gastroenterology

## 2016-02-03 ENCOUNTER — Encounter: Payer: Self-pay | Admitting: Orthopaedic Surgery

## 2016-02-03 ENCOUNTER — Ambulatory Visit (INDEPENDENT_AMBULATORY_CARE_PROVIDER_SITE_OTHER): Payer: Medicaid Other

## 2016-02-03 ENCOUNTER — Ambulatory Visit (INDEPENDENT_AMBULATORY_CARE_PROVIDER_SITE_OTHER): Payer: Medicaid Other | Admitting: Orthopaedic Surgery

## 2016-02-03 VITALS — BP 125/80 | HR 97 | Temp 97.0°F | Ht 60.0 in | Wt 182.6 lb

## 2016-02-03 DIAGNOSIS — J438 Other emphysema: Secondary | ICD-10-CM

## 2016-02-03 DIAGNOSIS — S92351G Displaced fracture of fifth metatarsal bone, right foot, subsequent encounter for fracture with delayed healing: Secondary | ICD-10-CM

## 2016-02-03 DIAGNOSIS — R1310 Dysphagia, unspecified: Secondary | ICD-10-CM

## 2016-02-03 DIAGNOSIS — M79671 Pain in right foot: Secondary | ICD-10-CM

## 2016-02-03 NOTE — Patient Instructions (Signed)
Return after CT.

## 2016-02-03 NOTE — Addendum Note (Signed)
Addended by: Baldomero Lamy B on: 02/03/2016 04:22 PM   Modules accepted: Orders

## 2016-02-03 NOTE — Progress Notes (Signed)
Subjective:  My right foot hurts    Patient ID: Sydney Blair, female    DOB: 1955/09/01, 60 y.o.   MRN: [id number]  Foot Pain This is a chronic problem. The current episode started more than 1 year ago. The problem occurs daily. The problem has been gradually worsening. Associated symptoms include arthralgias, coughing, joint swelling and myalgias. Pertinent negatives include no chest pain or congestion. The symptoms are aggravated by standing and walking. She has tried immobilization, NSAIDs, relaxation, rest, ice and heat for the symptoms. The treatment provided moderate relief.   She has a long history of right foot pain.  She has significant peripheral neuropathy and myofascitis.  She has no trauma.  She has had amputation of the right second toe years ago.  She has a large right foot bunion.  She has swelling of the foot different from the swelling she gets from her peripheral neuropathy.  She has no redness.  The foot hurts over the neck of the fifth and fourth metatarsals.  She is not getting any better but worse.  She saw a podiatrist in March of this year and x-rays were negative.  She only went one visit.  She is wearing only sandals now as she cannot get into a regular show.  She has COPD and that is a problem as well. That and the foot pain limit her activities.  She says she had no trauma to the foot.  She has chronic GERD and dysphagia that really limits her as well.  She has some knee pain and hip pains.  She smokes and not willing to stop.   Review of Systems  HENT: Negative for congestion.   Respiratory: Positive for cough and shortness of breath.   Cardiovascular: Negative for chest pain and leg swelling.  Endocrine: Positive for cold intolerance.  Musculoskeletal: Positive for myalgias, joint swelling, arthralgias and gait problem.  Allergic/Immunologic: Positive for environmental allergies.  Psychiatric/Behavioral: The patient is nervous/anxious.    Past  Medical History  Diagnosis Date  . Cancer [facility]) 2005    breast  . Incontinence   . Sebaceous cyst     lt labial location  . Tobacco abuse   . GERD (gastroesophageal reflux disease)   . Depression with anxiety   . COPD (chronic obstructive pulmonary disease) ([place])   . ARF (acute renal failure) ([place]) 08/07/2011  . Fibromyalgia   . Peripheral neuropathy ([place])   . Chronic back pain   . Sciatic pain     Past Surgical History  Procedure Laterality Date  . Breast lumpectomy      right with node dissection  . Esophagogastroduodenoscopy  11/11/2010    WCB:JSEGB hiatal hernia, 41 French dilator  . Colonoscopy  11/11/2010    TDV:[id number] rectal polyp and splenic flexure otherwise normal; pathology showed  tubular adenomas. Next TCS 10/2015.  . Bladder tack    . Mastectomy      partial  . Esophagogastroduodenoscopy (egd) with esophageal dilation N/A 06/21/2013    GYI:RSWNIO esophagus  - status post Maloney dilation s/p bx (benign esophageal bx)    Current Outpatient Prescriptions on File Prior to Visit  Medication Sig Dispense Refill  . albuterol (PROVENTIL HFA;VENTOLIN HFA) 108 (90 BASE) MCG/ACT inhaler Inhale 2 puffs into the lungs every 6 (six) hours as needed for wheezing or shortness of breath.    Marland Kitchen aspirin 81 MG tablet Take 81 mg by mouth daily.    . budesonide-formoterol (SYMBICORT) 160-4.5 MCG/ACT inhaler Inhale 2 puffs  into the lungs 2 (two) times daily.    . cetirizine (ZYRTEC) 10 MG tablet Take 10 mg by mouth daily.    Marland Kitchen dicyclomine (BENTYL) 10 MG capsule TAKE ONE CAPSULE BY MOUTH FOUR TIMES DAILY BEFORE MEALS AND AT BEDTIME. HOLD IF CONSTIPATED. 120 capsule 3  . DULoxetine (CYMBALTA) 60 MG capsule Take 60 mg by mouth daily.    . fluticasone (FLONASE) 50 MCG/ACT nasal spray Place 2 sprays into both nostrils daily.    Marland Kitchen gabapentin (NEURONTIN) 400 MG capsule Take 600 mg by mouth 4 (four) times daily.     . meclizine (ANTIVERT) 12.5 MG tablet Take 12.5 mg by mouth 2 (two) times  daily as needed for dizziness.     . methocarbamol (ROBAXIN) 500 MG tablet Take 500 mg by mouth 4 (four) times daily as needed for muscle spasms.    . pravastatin (PRAVACHOL) 40 MG tablet Take 40 mg by mouth every evening.    . tiotropium (SPIRIVA) 18 MCG inhalation capsule Place 1 capsule (18 mcg total) into inhaler and inhale daily. 30 capsule 6  . Vortioxetine HBr 10 MG TABS Take by mouth daily. Reported on 02/03/2016    . zolpidem (AMBIEN) 5 MG tablet Take 10 mg by mouth at bedtime as needed for sleep.     Marland Kitchen levothyroxine (SYNTHROID, LEVOTHROID) 100 MCG tablet Take 100 mcg by mouth daily. Reported on 02/03/2016    . meloxicam (MOBIC) 15 MG tablet Take 15 mg by mouth daily. Reported on 02/03/2016    . metroNIDAZOLE (FLAGYL) 500 MG tablet Take 1 tablet (500 mg total) by mouth 3 (three) times daily. (Patient not taking: Reported on 02/03/2016) 30 tablet 0  . pantoprazole (PROTONIX) 40 MG tablet Take 1 tablet (40 mg total) by mouth 2 (two) times daily before a meal. (Patient not taking: Reported on 02/03/2016) 60 tablet 5  . ranitidine (ZANTAC) 300 MG tablet Take 1 tablet (300 mg total) by mouth at bedtime. (Patient not taking: Reported on 02/03/2016) 30 tablet 5  . traMADol (ULTRAM) 50 MG tablet Take 50 mg by mouth every 6 (six) hours as needed. Reported on 02/03/2016     No current facility-administered medications on file prior to visit.    Social History   Social History  . Marital Status: Married    Spouse Name: N/A  . Number of Children: 1  . Years of Education: N/A   Occupational History  . disabled    Social History Main Topics  . Smoking status: Current Every Day Smoker -- 1.00 packs/day for 35 years    Types: Cigarettes  . Smokeless tobacco: Not on file  . Alcohol Use: No  . Drug Use: No  . Sexual Activity: Yes    Birth Control/ Protection: Post-menopausal     Comment: chemo stopped her periods   Other Topics Concern  . Not on file   Social History Narrative   Lives w.  Husband   Lost 1 son-?etiology    BP 125/80 mmHg  Pulse 97  Temp(Src) 97 F (36.1 C)  Ht 5' (1.524 m)  Wt 182 lb 9.6 oz (82.827 kg)  BMI 35.66 kg/m2     Objective:   Physical Exam  Constitutional: She is oriented to person, place, and time. She appears well-developed and well-nourished.  HENT:  Head: Normocephalic and atraumatic.  Eyes: Conjunctivae and EOM are normal. Pupils are equal, round, and reactive to light.  Neck: Normal range of motion. Neck supple.  Cardiovascular: Normal rate, regular rhythm  and intact distal pulses.   Pulmonary/Chest: Effort normal.  Abdominal: Soft.  Musculoskeletal: She exhibits tenderness (Pain of the right foot, more laterally.  Large bunion great toe and overlap of toe to the third toe.  Absence of second toe right.  Limp to right.  Left foot negative.).  She has some pain of both knees, crepitus but ROM is 0 to 110 bilaterally.  Limp to right secondary to foot problem.  Neurological: She is alert and oriented to person, place, and time. She displays normal reflexes. No cranial nerve deficit. She exhibits normal muscle tone. Coordination normal.  Skin: Skin is warm and dry.  Psychiatric: She has a normal mood and affect. Her behavior is normal. Judgment and thought content normal.    X-rays were done of the right foot, reported separately.      Assessment & Plan:   Encounter Diagnoses  Name Primary?  . Fracture of fifth metatarsal bone of right foot with delayed healing   . Right foot pain Yes  . Other emphysema ([place])   . Dysphagia    The fifth metatarsal has a fracture but not complete healing.  I would like to get a CT of the foot. I have shown her the x-rays and told her of my concerns.  She is agreeable to this.  She is to wear her sandals.  Call if any problem.  Precautions given.  Electronically Signed Sanjuana Kava, MD 6/13/201712:15 PM

## 2016-02-05 ENCOUNTER — Ambulatory Visit (HOSPITAL_COMMUNITY)
Admission: RE | Admit: 2016-02-05 | Discharge: 2016-02-05 | Disposition: A | Discharge status: Home / Self Care | Payer: Medicaid Other | Source: Ambulatory Visit | Attending: Orthopaedic Surgery | Admitting: Orthopaedic Surgery

## 2016-02-05 DIAGNOSIS — S92351A Displaced fracture of fifth metatarsal bone, right foot, initial encounter for closed fracture: Secondary | ICD-10-CM | POA: Insufficient documentation

## 2016-02-05 DIAGNOSIS — M79671 Pain in right foot: Secondary | ICD-10-CM

## 2016-02-05 DIAGNOSIS — X58XXXA Exposure to other specified factors, initial encounter: Secondary | ICD-10-CM | POA: Insufficient documentation

## 2016-02-05 DIAGNOSIS — M2011 Hallux valgus (acquired), right foot: Secondary | ICD-10-CM | POA: Insufficient documentation

## 2016-02-06 ENCOUNTER — Ambulatory Visit (HOSPITAL_COMMUNITY): Payer: Medicaid Other

## 2016-02-10 ENCOUNTER — Encounter: Payer: Self-pay | Admitting: Orthopaedic Surgery

## 2016-02-10 ENCOUNTER — Ambulatory Visit (INDEPENDENT_AMBULATORY_CARE_PROVIDER_SITE_OTHER): Payer: Medicaid Other | Admitting: Orthopaedic Surgery

## 2016-02-10 ENCOUNTER — Ambulatory Visit (INDEPENDENT_AMBULATORY_CARE_PROVIDER_SITE_OTHER): Payer: Medicaid Other

## 2016-02-10 VITALS — BP 132/86 | HR 100 | Temp 97.5°F | Resp 20 | Ht 65.0 in | Wt 182.0 lb

## 2016-02-10 DIAGNOSIS — M25552 Pain in left hip: Secondary | ICD-10-CM

## 2016-02-10 DIAGNOSIS — S92351K Displaced fracture of fifth metatarsal bone, right foot, subsequent encounter for fracture with nonunion: Secondary | ICD-10-CM | POA: Diagnosis not present

## 2016-02-10 NOTE — Progress Notes (Signed)
Patient Sydney Blair, female DOB:1956/01/13, 60 y.o. [id number]  Chief Complaint  Patient presents with  . Follow-up    right foot and left hip pain    HPI  Sydney Blair is a 60 y.o. female who has chronic right foot pain.  She got a CT scan of the right foot and it shows: IMPRESSION: 1. 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 Subcutaneous edema plantar to these fracture site. 2. Hallux valgus. Varus angulation at the Chopart joint. 3. 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 4. Mild degenerative findings at the Lisfranc joint, without Malalignment.  I have explained the findings to her.  This is what we talked about last visit about a possible nonunion of the fracture.  I have told her about possible surgery to correct this.  She has new problem:  Left hip pain confined to the left hip.  It hurts after walking on it. She has noted decreased motion in the left hip. She has no numbness, no trauma, no redness.   HPI  Body mass index is 30.29 kg/(m^2).  ROS  Review of Systems  HENT: Negative for congestion.   Respiratory: Positive for cough and shortness of breath.   Cardiovascular: Negative for chest pain and leg swelling.  Endocrine: Positive for cold intolerance.  Musculoskeletal: Positive for myalgias, joint swelling, arthralgias and gait problem.  Allergic/Immunologic: Positive for environmental allergies.  Psychiatric/Behavioral: The patient is nervous/anxious.     Past Medical History  Diagnosis Date  . Cancer [facility]) 2005    breast  . Incontinence   . Sebaceous cyst     lt labial location  . Tobacco abuse   . GERD (gastroesophageal reflux disease)   . Depression with anxiety   . COPD (chronic obstructive pulmonary disease) ([place])   . ARF (acute renal failure) ([place]) 08/07/2011  . Fibromyalgia   . Peripheral neuropathy ([place])    . Chronic back pain   . Sciatic pain     Past Surgical History  Procedure Laterality Date  . Breast lumpectomy      right with node dissection  . Esophagogastroduodenoscopy  11/11/2010    DJM:EQAST hiatal hernia, 18 French dilator  . Colonoscopy  11/11/2010    [id number] rectal polyp and splenic flexure otherwise normal; pathology showed  tubular adenomas. Next TCS 10/2015.  . Bladder tack    . Mastectomy      partial  . Esophagogastroduodenoscopy (egd) with esophageal dilation N/A 06/21/2013    JJH:ERDEYC esophagus  - status post Maloney dilation s/p bx (benign esophageal bx)    Family History  Problem Relation Age of Onset  . Cancer Other   . Heart attack Other   . Lung disease Other     Social History Social History  Substance Use Topics  . Smoking status: Current Every Day Smoker -- 1.00 packs/day for 35 years    Types: Cigarettes  . Smokeless tobacco: Not on file  . Alcohol Use: No    Allergies  Allergen Reactions  . Penicillins Anaphylaxis    Current Outpatient Prescriptions  Medication Sig Dispense Refill  . albuterol (PROVENTIL HFA;VENTOLIN HFA) 108 (90 BASE) MCG/ACT inhaler Inhale 2 puffs into the lungs every 6 (six) hours as needed for wheezing or shortness of breath.    Marland Kitchen aspirin 81 MG tablet Take 81 mg by mouth daily.    . budesonide-formoterol (SYMBICORT) 160-4.5 MCG/ACT inhaler Inhale 2  puffs into the lungs 2 (two) times daily.    . cetirizine (ZYRTEC) 10 MG tablet Take 10 mg by mouth daily.    . diclofenac (VOLTAREN) 0.1 % ophthalmic solution 4 (four) times daily.    Marland Kitchen dicyclomine (BENTYL) 10 MG capsule TAKE ONE CAPSULE BY MOUTH FOUR TIMES DAILY BEFORE MEALS AND AT BEDTIME. HOLD IF CONSTIPATED. 120 capsule 3  . DULoxetine (CYMBALTA) 60 MG capsule Take 60 mg by mouth daily.    . fluticasone (FLONASE) 50 MCG/ACT nasal spray Place 2 sprays into both nostrils daily.    Marland Kitchen gabapentin (NEURONTIN) 400 MG capsule Take 600 mg by mouth 4 (four) times daily.      Marland Kitchen ibuprofen (ADVIL,MOTRIN) 800 MG tablet Take 800 mg by mouth every 8 (eight) hours as needed.    Marland Kitchen levothyroxine (SYNTHROID, LEVOTHROID) 100 MCG tablet Take 100 mcg by mouth daily. Reported on 02/03/2016    . meclizine (ANTIVERT) 12.5 MG tablet Take 12.5 mg by mouth 2 (two) times daily as needed for dizziness.     . meloxicam (MOBIC) 15 MG tablet Take 15 mg by mouth daily. Reported on 02/03/2016    . methocarbamol (ROBAXIN) 500 MG tablet Take 500 mg by mouth 4 (four) times daily as needed for muscle spasms.    . metroNIDAZOLE (FLAGYL) 500 MG tablet Take 1 tablet (500 mg total) by mouth 3 (three) times daily. (Patient not taking: Reported on 02/03/2016) 30 tablet 0  . nortriptyline (PAMELOR) 25 MG capsule Take 25 mg by mouth at bedtime.    . pantoprazole (PROTONIX) 40 MG tablet TAKE 1 TABLET(40 MG) BY MOUTH TWICE DAILY BEFORE A MEAL 60 tablet 5  . pravastatin (PRAVACHOL) 40 MG tablet Take 40 mg by mouth every evening.    . ranitidine (ZANTAC) 300 MG tablet Take 1 tablet (300 mg total) by mouth at bedtime. (Patient not taking: Reported on 02/03/2016) 30 tablet 5  . tapentadol (NUCYNTA) 50 MG TABS tablet Take 50 mg by mouth.    . tiotropium (SPIRIVA) 18 MCG inhalation capsule Place 1 capsule (18 mcg total) into inhaler and inhale daily. 30 capsule 6  . traMADol (ULTRAM) 50 MG tablet Take 50 mg by mouth every 6 (six) hours as needed. Reported on 02/03/2016    . Vortioxetine HBr 10 MG TABS Take by mouth daily. Reported on 02/03/2016    . zolpidem (AMBIEN) 5 MG tablet Take 10 mg by mouth at bedtime as needed for sleep.      No current facility-administered medications for this visit.     Physical Exam  Blood pressure 132/86, pulse 100, temperature 97.5 F (36.4 C), resp. rate 20, height '5\' 5"'$  (1.651 m), weight 182 lb (82.555 kg).  Constitutional: overall normal hygiene, normal nutrition, well developed, normal grooming, normal body habitus. Assistive device:none  Musculoskeletal: gait and station  Limp right, muscle tone and strength are normal, no tremors or atrophy is present.  .  Neurological: coordination overall normal.  Deep tendon reflex/nerve stretch intact.  Sensation normal.  Cranial nerves II-XII intact.   Skin:   normal overall no scars, lesions, ulcers or rashes. No psoriasis.  Psychiatric: Alert and oriented x 3.  Recent memory intact, remote memory unclear.  Normal mood and affect. Well groomed.  Good eye contact.  Cardiovascular: overall no swelling, no varicosities, no edema bilaterally, normal temperatures of the legs and arms, no clubbing, cyanosis and good capillary refill.  Lymphatic: palpation is normal.  Her right foot is tender over the distal fifth metatarsal  with no redness or swelling.  NV is intact.  She has a limp to the right.  The left hip has full motion and good gait. She has no pain of the right hip.  X-rays were done of the left hip, reported separately.  The patient has been educated about the nature of the problem(s) and counseled on treatment options.  The patient appeared to understand what I have discussed and is in agreement with it.  Encounter Diagnoses  Name Primary?  . 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 Left hip pain Yes    PLAN Call if any problems.  Precautions discussed.  Continue current medications.   Return to clinic 1 month   She wants to think about possible surgery of the right foot at this time.  Electronically Signed Sanjuana Kava, MD 6/20/20173:26 PM

## 2016-03-09 ENCOUNTER — Encounter: Payer: Self-pay | Admitting: Orthopaedic Surgery

## 2016-03-09 ENCOUNTER — Ambulatory Visit (INDEPENDENT_AMBULATORY_CARE_PROVIDER_SITE_OTHER): Payer: Medicaid Other | Admitting: Orthopaedic Surgery

## 2016-03-09 VITALS — BP 129/85 | HR 98 | Temp 97.9°F | Ht 60.0 in | Wt 184.4 lb

## 2016-03-09 DIAGNOSIS — J438 Other emphysema: Secondary | ICD-10-CM

## 2016-03-09 DIAGNOSIS — S92351K Displaced fracture of fifth metatarsal bone, right foot, subsequent encounter for fracture with nonunion: Secondary | ICD-10-CM | POA: Diagnosis not present

## 2016-03-09 NOTE — Progress Notes (Signed)
Patient Sydney Blair, female DOB:[birth year], 58 y.o. [id number]  Chief Complaint  Patient presents with  . Follow-up    Right Foot    HPI  Sydney Blair is a 60 y.o. 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  She has thought about possible surgery of the foot.  She wants to wait for now.  I talked to her about the surgery.  She has a bunion of the right great toe and prior amputation of the second toe.  The great toe is now tilted and rubbing the third toe.  She wants to consider surgery on both parts of the foot but not now.  I will see her as needed.  I went over precautions.  HPI  Body mass index is 36.01 kg/(m^2).  ROS  Review of Systems  HENT: Negative for congestion.   Respiratory: Positive for cough and shortness of breath.   Cardiovascular: Negative for chest pain and leg swelling.  Endocrine: Positive for cold intolerance.  Musculoskeletal: Positive for myalgias, joint swelling, arthralgias and gait problem.  Allergic/Immunologic: Positive for environmental allergies.  Psychiatric/Behavioral: The patient is nervous/anxious.     Past Medical History  Diagnosis Date  . Cancer [facility]) 2005    breast  . Incontinence   . Sebaceous cyst     lt labial location  . Tobacco abuse   . GERD (gastroesophageal reflux disease)   . Depression with anxiety   . COPD (chronic obstructive pulmonary disease) ([place])   . ARF (acute renal failure) ([place]) 08/07/2011  . Fibromyalgia   . Peripheral neuropathy ([place])   . Chronic back pain   . Sciatic pain     Past Surgical History  Procedure Laterality Date  . Breast lumpectomy      right with node dissection  . Esophagogastroduodenoscopy  11/11/2010    IRJ:JOACZ hiatal hernia, 50 French dilator  . Colonoscopy  11/11/2010    [id number] rectal polyp and splenic flexure otherwise normal; pathology showed  tubular adenomas. Next TCS 10/2015.  . Bladder tack    . Mastectomy      partial  .  Esophagogastroduodenoscopy (egd) with esophageal dilation N/A 06/21/2013    ATF:TDDUKG esophagus  - status post Maloney dilation s/p bx (benign esophageal bx)    Family History  Problem Relation Age of Onset  . Cancer Other   . Heart attack Other   . Lung disease Other     Social History Social History  Substance Use Topics  . Smoking status: Current Every Day Smoker -- 1.00 packs/day for 35 years    Types: Cigarettes  . Smokeless tobacco: None  . Alcohol Use: No    Allergies  Allergen Reactions  . Penicillins Anaphylaxis    Current Outpatient Prescriptions  Medication Sig Dispense Refill  . albuterol (PROVENTIL HFA;VENTOLIN HFA) 108 (90 BASE) MCG/ACT inhaler Inhale 2 puffs into the lungs every 6 (six) hours as needed for wheezing or shortness of breath.    Marland Kitchen aspirin 81 MG tablet Take 81 mg by mouth daily.    . budesonide-formoterol (SYMBICORT) 160-4.5 MCG/ACT inhaler Inhale 2 puffs into the lungs 2 (two) times daily.    . cetirizine (ZYRTEC) 10 MG tablet Take 10 mg by mouth daily.    . diclofenac (VOLTAREN) 0.1 % ophthalmic solution 4 (four) times daily.    Marland Kitchen dicyclomine (BENTYL) 10 MG capsule TAKE ONE CAPSULE BY MOUTH FOUR TIMES DAILY BEFORE MEALS AND AT BEDTIME. HOLD IF CONSTIPATED. 120 capsule 3  . DULoxetine (  CYMBALTA) 60 MG capsule Take 60 mg by mouth daily.    . fluticasone (FLONASE) 50 MCG/ACT nasal spray Place 2 sprays into both nostrils daily.    Marland Kitchen gabapentin (NEURONTIN) 400 MG capsule Take 600 mg by mouth 4 (four) times daily.     Marland Kitchen ibuprofen (ADVIL,MOTRIN) 800 MG tablet Take 800 mg by mouth every 8 (eight) hours as needed.    Marland Kitchen levothyroxine (SYNTHROID, LEVOTHROID) 100 MCG tablet Take 100 mcg by mouth daily. Reported on 02/03/2016    . meclizine (ANTIVERT) 12.5 MG tablet Take 12.5 mg by mouth 2 (two) times daily as needed for dizziness.     . meloxicam (MOBIC) 15 MG tablet Take 15 mg by mouth daily. Reported on 02/03/2016    . methocarbamol (ROBAXIN) 500 MG tablet  Take 500 mg by mouth 4 (four) times daily as needed for muscle spasms.    . metroNIDAZOLE (FLAGYL) 500 MG tablet Take 1 tablet (500 mg total) by mouth 3 (three) times daily. 30 tablet 0  . nortriptyline (PAMELOR) 25 MG capsule Take 25 mg by mouth at bedtime.    . pantoprazole (PROTONIX) 40 MG tablet TAKE 1 TABLET(40 MG) BY MOUTH TWICE DAILY BEFORE A MEAL 60 tablet 5  . pravastatin (PRAVACHOL) 40 MG tablet Take 40 mg by mouth every evening.    . ranitidine (ZANTAC) 300 MG tablet Take 1 tablet (300 mg total) by mouth at bedtime. 30 tablet 5  . tapentadol (NUCYNTA) 50 MG TABS tablet Take 50 mg by mouth.    . tiotropium (SPIRIVA) 18 MCG inhalation capsule Place 1 capsule (18 mcg total) into inhaler and inhale daily. 30 capsule 6  . traMADol (ULTRAM) 50 MG tablet Take 50 mg by mouth every 6 (six) hours as needed. Reported on 02/03/2016    . Vortioxetine HBr 10 MG TABS Take by mouth daily. Reported on 02/03/2016    . zolpidem (AMBIEN) 5 MG tablet Take 10 mg by mouth at bedtime as needed for sleep.      No current facility-administered medications for this visit.     Physical Exam  Blood pressure 129/85, pulse 98, temperature 97.9 F (36.6 C), height 5' (1.524 m), weight 184 lb 6.4 oz (83.643 kg).  Constitutional: overall normal hygiene, normal nutrition, well developed, normal grooming, normal body habitus. Assistive device:none  Musculoskeletal: gait and station Limp right, muscle tone and strength are normal, no tremors or atrophy is present.  .  Neurological: coordination overall normal.  Deep tendon reflex/nerve stretch intact.  Sensation normal.  Cranial nerves II-XII intact.   Skin:   normal overall no scars, lesions, ulcers or rashes. No psoriasis.  Psychiatric: Alert and oriented x 3.  Recent memory intact, remote memory unclear.  Normal mood and affect. Well groomed.  Good eye contact.  Cardiovascular: overall no swelling, no varicosities, no edema bilaterally, normal temperatures of  the legs and arms, no clubbing, cyanosis and good capillary refill.  Lymphatic: palpation is normal.  She has bunion deformity of the right great toe and prior amputation of the second toe on the right.  She has tenderness of the fifth metatarsal with no redness or swelling. She has a limp to the right.  Left foot negative.  The patient has been educated about the nature of the problem(s) and counseled on treatment options.  The patient appeared to understand what I have discussed and is in agreement with it.  Encounter Diagnoses  Name Primary?  . 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 Other emphysema ([place])     PLAN Call if any problems.  Precautions discussed.  Continue current medications.   Return to clinic see as needed.   Electronically Signed Sanjuana Kava, MD 7/18/201710:18 AM

## 2016-05-03 ENCOUNTER — Other Ambulatory Visit: Payer: Self-pay | Admitting: Gastroenterology

## 2016-05-28 ENCOUNTER — Ambulatory Visit (INDEPENDENT_AMBULATORY_CARE_PROVIDER_SITE_OTHER): Payer: Medicaid Other

## 2016-05-28 ENCOUNTER — Ambulatory Visit (INDEPENDENT_AMBULATORY_CARE_PROVIDER_SITE_OTHER): Payer: Medicaid Other | Admitting: Orthopedic Surgery

## 2016-05-28 VITALS — BP 131/77 | HR 92 | Ht 61.0 in | Wt 176.0 lb

## 2016-05-28 DIAGNOSIS — M79645 Pain in left finger(s): Secondary | ICD-10-CM

## 2016-05-28 DIAGNOSIS — S62617A Displaced fracture of proximal phalanx of left little finger, initial encounter for closed fracture: Secondary | ICD-10-CM

## 2016-05-28 NOTE — Progress Notes (Signed)
Patient ID: Sydney Blair, female   DOB: 05/18/1956, 60 y.o.   MRN: [id number]  Chief Complaint  Patient presents with  . Hand Pain    Left smal finger.    HPI Sydney Blair is a 60 y.o. female.  Presents for evaluation of her left small finger status post fall 2-1/2 weeks ago  Complains of 7 out of 10 constant throbbing pain swelling stiffness and numbness in the left small finger  Review of Systems Review of Systems 1. No chest pain 2. No shortness of breath   Past Medical History:  Diagnosis Date  . ARF (acute renal failure) ([place]) 08/07/2011  . Cancer ([place]) 2005   breast  . Chronic back pain   . COPD (chronic obstructive pulmonary disease) ([place])   . Depression with anxiety   . Fibromyalgia   . GERD (gastroesophageal reflux disease)   . Incontinence   . Peripheral neuropathy ([place])   . Sciatic pain   . Sebaceous cyst    lt labial location  . Tobacco abuse     Past Surgical History:  Procedure Laterality Date  . bladder tack    . BREAST LUMPECTOMY     right with node dissection  . COLONOSCOPY  11/11/2010   TOI:[id number] rectal polyp and splenic flexure otherwise normal; pathology showed  tubular adenomas. Next TCS 10/2015.  Marland Kitchen ESOPHAGOGASTRODUODENOSCOPY  11/11/2010   PJA:SNKNL hiatal hernia, 43 French dilator  . ESOPHAGOGASTRODUODENOSCOPY (EGD) WITH ESOPHAGEAL DILATION N/A 06/21/2013   ZJQ:BHALPF esophagus  - status post Maloney dilation s/p bx (benign esophageal bx)  . MASTECTOMY     partial    Social History Social History  Substance Use Topics  . Smoking status: Current Every Day Smoker    Packs/day: 1.00    Years: 35.00    Types: Cigarettes  . Smokeless tobacco: Not on file  . Alcohol use No    Allergies  Allergen Reactions  . Penicillins Anaphylaxis    No outpatient prescriptions have been marked as taking for the 05/28/16 encounter (Office Visit) with Carole Civil, MD.      Physical Exam Physical Exam BP 131/77   Pulse 92    Ht '5\' 1"'$  (1.549 m)   Wt 176 lb (79.8 kg)   BMI 33.25 kg/m   Gen. appearance. The patient is well-developed and well-nourished, grooming and hygiene are normal. There are no gross congenital abnormalities  The patient is alert and oriented to person place and time  Mood and affect are normal  Ambulation no acute abnormalities on ambulation  Examination reveals the following: On inspection we find left small finger swollen tender in motion at the fracture site of the proximal phalanx  With the range of motion of  decreased at the metacarpophalangeal joint and IP joints of the left small finger  Stability tests were not testable because of the fracture just distal to the joint  Strength tests revealed grade 5 motor strength  Skin we find no rash ulceration or erythema  Sensation remains intact  Impression vascular system shows no peripheral edema  Data Reviewed X-ray shows a proximal phalanx fracture slight apex volar angulation  Assessment    Closed fracture proximal phalanx left small finger    Plan    Close reduction splint x-ray 3 weeks       Arther Abbott 05/28/2016, 12:48 PM

## 2016-06-18 ENCOUNTER — Ambulatory Visit (INDEPENDENT_AMBULATORY_CARE_PROVIDER_SITE_OTHER): Payer: Medicaid Other

## 2016-06-18 ENCOUNTER — Encounter: Payer: Self-pay | Admitting: Orthopedic Surgery

## 2016-06-18 ENCOUNTER — Ambulatory Visit (INDEPENDENT_AMBULATORY_CARE_PROVIDER_SITE_OTHER): Payer: Medicaid Other | Admitting: Orthopedic Surgery

## 2016-06-18 DIAGNOSIS — S62647D Nondisplaced fracture of proximal phalanx of left little finger, subsequent encounter for fracture with routine healing: Secondary | ICD-10-CM

## 2016-06-18 NOTE — Progress Notes (Signed)
Patient ID: Sydney Blair, female   DOB: February 20, 1956, 60 y.o.   MRN: [id number]  FRACTURE CARE   Chief Complaint  Patient presents with  . Follow-up    LEFT SMALL FINGER FX, DOI 09/19?   Clinical alignment looks okay flexion is limited but in all 4 fingers are bent together she has decent motion  x-ray shows angulated fracture small finger proximal phalanx    I reviewed with her some exercises to do she doesn't have to come back.

## 2016-06-18 NOTE — Patient Instructions (Signed)
WORK ON EXERCISES

## 2016-08-26 ENCOUNTER — Other Ambulatory Visit (HOSPITAL_COMMUNITY): Payer: Self-pay | Admitting: Internal Medicine

## 2016-08-26 DIAGNOSIS — IMO0002 Reserved for concepts with insufficient information to code with codable children: Secondary | ICD-10-CM

## 2016-08-26 DIAGNOSIS — R229 Localized swelling, mass and lump, unspecified: Principal | ICD-10-CM

## 2016-09-02 ENCOUNTER — Other Ambulatory Visit (HOSPITAL_COMMUNITY): Payer: Self-pay | Admitting: Internal Medicine

## 2016-09-02 DIAGNOSIS — N6310 Unspecified lump in the right breast, unspecified quadrant: Secondary | ICD-10-CM

## 2016-09-03 ENCOUNTER — Ambulatory Visit: Payer: Medicaid Other | Admitting: Gastroenterology

## 2016-09-06 ENCOUNTER — Encounter: Payer: Self-pay | Admitting: Gastroenterology

## 2016-09-06 ENCOUNTER — Ambulatory Visit (INDEPENDENT_AMBULATORY_CARE_PROVIDER_SITE_OTHER): Payer: Medicaid Other | Admitting: Gastroenterology

## 2016-09-06 VITALS — BP 101/72 | HR 104 | Temp 98.1°F | Ht 60.0 in | Wt 172.8 lb

## 2016-09-06 DIAGNOSIS — R1319 Other dysphagia: Secondary | ICD-10-CM

## 2016-09-06 DIAGNOSIS — K219 Gastro-esophageal reflux disease without esophagitis: Secondary | ICD-10-CM | POA: Diagnosis not present

## 2016-09-06 MED ORDER — DEXLANSOPRAZOLE 60 MG PO CPDR: 60.0000 mg | DELAYED_RELEASE_CAPSULE | Freq: Every day | ORAL | 11 refills | Status: DC

## 2016-09-06 NOTE — Assessment & Plan Note (Signed)
61 year old female with history of historically difficult to control GERD who presents back with a 2 to three-week history of refractory symptoms. One week ago began having some dysphagia to solid foods. Suspect symptoms due to refractory GERD. Initially tried to change PPI therapy. Stop pantoprazole. Begin Dexilant 60 mg daily. Discussed antireflux measures, food handout provided. She'll come back in 4 weeks for follow-up. If ongoing refractory symptoms, consider EGD at that time. She is also due for colonoscopy and would pursue that after next office visit. Discussed with patient who is in agreement with plan.  Plan for deep sedation in the OR given polypharmacy and chronic opioids.

## 2016-09-06 NOTE — Patient Instructions (Signed)
1. Stop pantoprazole. Start Dexilant ONCE daily before breakfast. Samples and RX provided.  2. Return to the office in four weeks to schedule your colonoscopy and we may offer upper endoscopy depending on if you continue to have difficult to manage reflux.  Food Choices for Gastroesophageal Reflux Disease, Adult When you have gastroesophageal reflux disease (GERD), the foods you eat and your eating habits are very important. Choosing the right foods can help ease the discomfort of GERD. What general guidelines do I need to follow?  Choose fruits, vegetables, whole grains, low-fat dairy products, and low-fat meat, fish, and poultry.  Limit fats such as oils, salad dressings, butter, nuts, and avocado.  Keep a food diary to identify foods that cause symptoms.  Avoid foods that cause reflux. These may be different for different people.  Eat frequent small meals instead of three large meals each day.  Eat your meals slowly, in a relaxed setting.  Limit fried foods.  Cook foods using methods other than frying.  Avoid drinking alcohol.  Avoid drinking large amounts of liquids with your meals.  Avoid bending over or lying down until 2-3 hours after eating. What foods are not recommended? The following are some foods and drinks that may worsen your symptoms: Vegetables  Tomatoes. Tomato juice. Tomato and spaghetti sauce. Chili peppers. Onion and garlic. Horseradish. Fruits  Oranges, grapefruit, and lemon (fruit and juice). Meats  High-fat meats, fish, and poultry. This includes hot dogs, ribs, ham, sausage, salami, and bacon. Dairy  Whole milk and chocolate milk. Sour cream. Cream. Butter. Ice cream. Cream cheese. Beverages  Coffee and tea, with or without caffeine. Carbonated beverages or energy drinks. Condiments  Hot sauce. Barbecue sauce. Sweets/Desserts  Chocolate and cocoa. Donuts. Peppermint and spearmint. Fats and Oils  High-fat foods, including [place] fries and potato  chips. Other  Vinegar. Strong spices, such as black pepper, white pepper, red pepper, cayenne, curry powder, cloves, ginger, and chili powder. The items listed above may not be a complete list of foods and beverages to avoid. Contact your dietitian for more information.  This information is not intended to replace advice given to you by your health care provider. Make sure you discuss any questions you have with your health care provider. Document Released: 08/09/2005 Document Revised: 01/15/2016 Document Reviewed: 06/13/2013 Elsevier Interactive Patient Education  2017 [employer].

## 2016-09-06 NOTE — Progress Notes (Signed)
Primary Care Physician: Philis Fendt, MD  Primary Gastroenterologist:  Garfield Cornea, MD   Chief Complaint  Patient presents with  . Gastroesophageal Reflux    HPI: Sydney Blair is a 61 y.o. female here for follow-up. History of chronic GERD,? LPR, recurrent diarrhea. Last seen in November 2016. She presents today stating of the past 2-3 weeks she's had refractory reflux. Typically has been on pantoprazole 40 mg twice a day and Zantac 300 mg. Has had to add Rolaids. She is having bad heartburn after meals, nocturnal. Over the past 1 week feels like food doesn't go down well. No vomiting. No odynophagia. Denies abdominal pain. Bowel movements are regular. Usually one to 2 stools per day. No melena or rectal bleeding. Denies any new medication changes. No new activities. Eating the same foods as usual. She has previously failed omeprazole and now pantoprazole both twice a day.  Patient is due for surveillance colonoscopy for history of tubular adenomas. She was supposed to have a colonoscopy in March 2017, she states she did not receive the reminder letter that we sent.   Current Outpatient Prescriptions  Medication Sig Dispense Refill  . albuterol (PROVENTIL HFA;VENTOLIN HFA) 108 (90 BASE) MCG/ACT inhaler Inhale 2 puffs into the lungs every 6 (six) hours as needed for wheezing or shortness of breath.    Marland Kitchen aspirin 81 MG tablet Take 81 mg by mouth daily.    . budesonide-formoterol (SYMBICORT) 160-4.5 MCG/ACT inhaler Inhale 2 puffs into the lungs 2 (two) times daily.    . Ca Carbonate-Mag Hydroxide (ROLAIDS PO) Take 1 tablet by mouth as needed.    . cetirizine (ZYRTEC) 10 MG tablet Take 10 mg by mouth daily.    Marland Kitchen dicyclomine (BENTYL) 10 MG capsule TAKE 1 CAPSULE BY MOUTH FOUR TIMES DAILY BEFORE MEALS AND AT BEDTIME. HOLD IF CONSTIPATED 120 capsule 3  . DULoxetine (CYMBALTA) 60 MG capsule Take 60 mg by mouth daily.    . fluticasone (FLONASE) 50 MCG/ACT nasal spray Place 2 sprays  into both nostrils daily.    Marland Kitchen gabapentin (NEURONTIN) 400 MG capsule Take 800 mg by mouth 4 (four) times daily.     Marland Kitchen ibuprofen (ADVIL,MOTRIN) 800 MG tablet Take 800 mg by mouth every 8 (eight) hours as needed.    Marland Kitchen levothyroxine (SYNTHROID, LEVOTHROID) 100 MCG tablet Take 100 mcg by mouth daily. Reported on 02/03/2016    . meclizine (ANTIVERT) 12.5 MG tablet Take 12.5 mg by mouth 2 (two) times daily as needed for dizziness.     . meloxicam (MOBIC) 15 MG tablet Take 15 mg by mouth daily. Reported on 02/03/2016    . methocarbamol (ROBAXIN) 500 MG tablet Take 500 mg by mouth 4 (four) times daily as needed for muscle spasms.    . nortriptyline (PAMELOR) 25 MG capsule Take 25 mg by mouth at bedtime.    . pantoprazole (PROTONIX) 40 MG tablet TAKE 1 TABLET(40 MG) BY MOUTH TWICE DAILY BEFORE A MEAL 60 tablet 5  . pravastatin (PRAVACHOL) 40 MG tablet Take 40 mg by mouth every evening.    . ranitidine (ZANTAC) 300 MG tablet Take 1 tablet (300 mg total) by mouth at bedtime. 30 tablet 5  . tapentadol (NUCYNTA) 50 MG TABS tablet Take 100 mg by mouth 3 (three) times daily.     Marland Kitchen tiotropium (SPIRIVA) 18 MCG inhalation capsule Place 1 capsule (18 mcg total) into inhaler and inhale daily. 30 capsule 6  . zolpidem (AMBIEN) 5 MG tablet  Take 10 mg by mouth at bedtime as needed for sleep.      No current facility-administered medications for this visit.     Allergies as of 09/06/2016 - Review Complete 09/06/2016  Allergen Reaction Noted  . Penicillins Anaphylaxis         ROS:  General: Negative for anorexia, weight loss, fever, chills, fatigue, weakness. ENT: Negative for hoarseness, difficulty swallowing , nasal congestion. CV: Negative for chest pain, angina, palpitations, dyspnea on exertion, peripheral edema.  Respiratory: Negative for dyspnea at rest, dyspnea on exertion, cough, sputum, wheezing.  GI: See history of present illness. GU:  Negative for dysuria, hematuria, urinary incontinence, urinary  frequency, nocturnal urination.  Endo: Negative for unusual weight change.    Physical Examination:   BP 101/72   Pulse (!) 104   Temp 98.1 F (36.7 C) (Oral)   Ht 5' (1.524 m)   Wt 172 lb 12.8 oz (78.4 kg)   BMI 33.75 kg/m   General: Well-nourished, well-developed in no acute distress.  Eyes: No icterus. Mouth: Oropharyngeal mucosa moist and pink , no lesions erythema or exudate. Lungs: Clear to auscultation bilaterally.  Heart: Regular rate and rhythm, no murmurs rubs or gallops.  Abdomen: Bowel sounds are normal, nontender, nondistended, no hepatosplenomegaly or masses, no abdominal bruits or hernia , no rebound or guarding.   Extremities: No lower extremity edema. No clubbing or deformities. Neuro: Alert and oriented x 4   Skin: Warm and dry, no jaundice.   Psych: Alert and cooperative, normal mood and affect.

## 2016-09-07 ENCOUNTER — Encounter (HOSPITAL_COMMUNITY): Payer: Medicaid Other

## 2016-09-07 NOTE — Progress Notes (Signed)
CC'ED TO PCP 

## 2016-09-16 ENCOUNTER — Ambulatory Visit: Payer: Medicaid Other | Admitting: Gastroenterology

## 2016-10-05 ENCOUNTER — Other Ambulatory Visit: Payer: Self-pay

## 2016-10-05 ENCOUNTER — Encounter: Payer: Self-pay | Admitting: Gastroenterology

## 2016-10-05 ENCOUNTER — Ambulatory Visit (INDEPENDENT_AMBULATORY_CARE_PROVIDER_SITE_OTHER): Payer: Medicaid Other | Admitting: Gastroenterology

## 2016-10-05 VITALS — BP 131/68 | HR 93 | Temp 98.3°F | Ht 60.0 in | Wt 171.2 lb

## 2016-10-05 DIAGNOSIS — R1310 Dysphagia, unspecified: Secondary | ICD-10-CM

## 2016-10-05 DIAGNOSIS — R1319 Other dysphagia: Secondary | ICD-10-CM

## 2016-10-05 DIAGNOSIS — K219 Gastro-esophageal reflux disease without esophagitis: Secondary | ICD-10-CM

## 2016-10-05 DIAGNOSIS — D369 Benign neoplasm, unspecified site: Secondary | ICD-10-CM

## 2016-10-05 DIAGNOSIS — Z86010 Personal history of colonic polyps: Secondary | ICD-10-CM

## 2016-10-05 MED ORDER — PEG 3350-KCL-NA BICARB-NACL 420 G PO SOLR: 4000.0000 mL | ORAL | 0 refills | Status: DC

## 2016-10-05 NOTE — Progress Notes (Signed)
cc'ed to pcp °

## 2016-10-05 NOTE — Progress Notes (Signed)
Primary Care Physician:  Philis Fendt, MD  Primary Gastroenterologist:  Garfield Cornea, MD   Chief Complaint  Patient presents with  . Gastroesophageal Reflux    doing better    HPI:  Sydney Blair is a 61 y.o. female here for follow-up. She was last seen in January 2018. Seen at that time for refractory reflux.  History of chronic GERD, question LPR, recurrent diarrhea. She was due surveillance colonoscopy March 2017 for history of adenomatous colon polyps. At last office visit she was switched from pantoprazole to [place]. She states that heartburn is much better controlled. Swallowing issues really has not improved. Previous esophageal dilations helped significantly. Having lots of problems with solid foods especially bread, meat. Denies abdominal pain. Bowel movements have been irregular. No blood in the stool or melena. She is ready to schedule her surveillance colonoscopy for history of tubular adenomas.  Current Outpatient Prescriptions  Medication Sig Dispense Refill  . albuterol (PROVENTIL HFA;VENTOLIN HFA) 108 (90 BASE) MCG/ACT inhaler Inhale 2 puffs into the lungs every 6 (six) hours as needed for wheezing or shortness of breath.    Marland Kitchen aspirin 81 MG tablet Take 81 mg by mouth daily.    . budesonide-formoterol (SYMBICORT) 160-4.5 MCG/ACT inhaler Inhale 2 puffs into the lungs 2 (two) times daily.    . Ca Carbonate-Mag Hydroxide (ROLAIDS PO) Take 1 tablet by mouth as needed.    . cetirizine (ZYRTEC) 10 MG tablet Take 10 mg by mouth daily.    Marland Kitchen dexlansoprazole (DEXILANT) 60 MG capsule Take 1 capsule (60 mg total) by mouth daily before breakfast. 30 capsule 11  . dicyclomine (BENTYL) 10 MG capsule TAKE 1 CAPSULE BY MOUTH FOUR TIMES DAILY BEFORE MEALS AND AT BEDTIME. HOLD IF CONSTIPATED 120 capsule 3  . DULoxetine (CYMBALTA) 60 MG capsule Take 60 mg by mouth daily.    . fluticasone (FLONASE) 50 MCG/ACT nasal spray Place 2 sprays into both nostrils daily.    Marland Kitchen gabapentin  (NEURONTIN) 400 MG capsule Take 800 mg by mouth 4 (four) times daily.     Marland Kitchen ibuprofen (ADVIL,MOTRIN) 800 MG tablet Take 800 mg by mouth every 8 (eight) hours as needed.    Marland Kitchen levothyroxine (SYNTHROID, LEVOTHROID) 100 MCG tablet Take 100 mcg by mouth daily. Reported on 02/03/2016    . meclizine (ANTIVERT) 12.5 MG tablet Take 12.5 mg by mouth 2 (two) times daily as needed for dizziness.     . meloxicam (MOBIC) 15 MG tablet Take 15 mg by mouth daily. Reported on 02/03/2016    . methocarbamol (ROBAXIN) 500 MG tablet Take 500 mg by mouth 4 (four) times daily as needed for muscle spasms.    . nortriptyline (PAMELOR) 25 MG capsule Take 25 mg by mouth at bedtime.    . pravastatin (PRAVACHOL) 40 MG tablet Take 40 mg by mouth every evening.    . ranitidine (ZANTAC) 300 MG tablet Take 1 tablet (300 mg total) by mouth at bedtime. 30 tablet 5  . tapentadol (NUCYNTA) 50 MG TABS tablet Take 100 mg by mouth 3 (three) times daily.     Marland Kitchen tiotropium (SPIRIVA) 18 MCG inhalation capsule Place 1 capsule (18 mcg total) into inhaler and inhale daily. 30 capsule 6  . zolpidem (AMBIEN) 5 MG tablet Take 10 mg by mouth at bedtime as needed for sleep.      No current facility-administered medications for this visit.     Allergies as of 10/05/2016 - Review Complete 10/05/2016  Allergen Reaction Noted  .  Penicillins Anaphylaxis     Past Medical History:  Diagnosis Date  . ARF (acute renal failure) ([place]) 08/07/2011  . Cancer ([place]) 2005   breast  . Chronic back pain   . COPD (chronic obstructive pulmonary disease) ([place])   . Depression with anxiety   . Fibromyalgia   . GERD (gastroesophageal reflux disease)   . Incontinence   . Peripheral neuropathy ([place])   . Sciatic pain   . Sebaceous cyst    lt labial location  . Tobacco abuse     Past Surgical History:  Procedure Laterality Date  . bladder tack    . BREAST LUMPECTOMY     right with node dissection  . COLONOSCOPY  11/11/2010   [id number] rectal polyp  and splenic flexure otherwise normal; pathology showed  tubular adenomas. Next TCS 10/2015.  Marland Kitchen ESOPHAGOGASTRODUODENOSCOPY  11/11/2010   [id number] hiatal hernia, 60 French dilator  . ESOPHAGOGASTRODUODENOSCOPY (EGD) WITH ESOPHAGEAL DILATION N/A 06/21/2013   [id number] esophagus  - status post Maloney dilation s/p bx (benign esophageal bx)  . MASTECTOMY     partial    Family History  Problem Relation Age of Onset  . Cancer Other   . Heart attack Other   . Lung disease Other     Social History   Social History  . Marital status: Legally Separated    Spouse name: N/A  . Number of children: 1  . Years of education: N/A   Occupational History  . disabled Unemployed   Social History Main Topics  . Smoking status: Current Every Day Smoker    Packs/day: 1.00    Years: 35.00    Types: Cigarettes  . Smokeless tobacco: Never Used     Comment: one pack daily  . Alcohol use No  . Drug use: No  . Sexual activity: Yes    Birth control/ protection: Post-menopausal     Comment: chemo stopped her periods   Other Topics Concern  . Not on file   Social History Narrative   Lives w. Husband   Lost 1 son-?etiology      ROS:  General: Negative for anorexia, weight loss, fever, chills, fatigue, weakness. Eyes: Negative for vision changes.  ENT: Negative for hoarseness,nasal congestion.See history of present illness CV: Negative for chest pain, angina, palpitations, dyspnea on exertion, peripheral edema.  Respiratory: Negative for dyspnea at rest, dyspnea on exertion, cough, sputum, wheezing.  GI: See history of present illness. GU:  Negative for dysuria, hematuria, urinary incontinence, urinary frequency, nocturnal urination.  MS: Negative for joint pain, low back pain.  Derm: Negative for rash or itching.  Neuro: Negative for weakness, abnormal sensation, seizure, frequent headaches, memory loss, confusion.  Psych: Negative for anxiety, depression, suicidal ideation, hallucinations.   Endo: Negative for unusual weight change.  Heme: Negative for bruising or bleeding. Allergy: Negative for rash or hives.    Physical Examination:  BP 131/68   Pulse 93   Temp 98.3 F (36.8 C) (Oral)   Ht 5' (1.524 m)   Wt 171 lb 3.2 oz (77.7 kg)   BMI 33.44 kg/m    General: Well-nourished, well-developed in no acute distress.  Head: Normocephalic, atraumatic.   Eyes: Conjunctiva pink, no icterus. Mouth: Oropharyngeal mucosa moist and pink , no lesions erythema or exudate. Neck: Supple without thyromegaly, masses, or lymphadenopathy.  Lungs: Clear to auscultation bilaterally.  Heart: Regular rate and rhythm, no murmurs rubs or gallops.  Abdomen: Bowel sounds are normal, nontender, nondistended, no hepatosplenomegaly or masses, no  abdominal bruits or    hernia , no rebound or guarding.   Rectal: Not performed Extremities: No lower extremity edema. No clubbing or deformities.  Neuro: Alert and oriented x 4 , grossly normal neurologically.  Skin: Warm and dry, no rash or jaundice.   Psych: Alert and cooperative, normal mood and affect.

## 2016-10-05 NOTE — Patient Instructions (Signed)
1. Upper endoscopy and colonoscopy with Dr. Gala Romney. Please see separate instructions.

## 2016-10-05 NOTE — Assessment & Plan Note (Signed)
Chronic GERD with recent refractory symptoms. Change in PPI resulted in resolution of refractory heartburn. She continues to have solid food esophageal dysphagia however. Previous EGD with dilation helped her symptoms. She would like to try esophageal dilation again, this is reasonable. Plan for EGD with dilation at time of colonoscopy, deep sedation planned due to polypharmacy.  I have discussed the risks, alternatives, benefits with regards to but not limited to the risk of reaction to medication, bleeding, infection, perforation and the patient is agreeable to proceed. Written consent to be obtained.

## 2016-10-05 NOTE — Assessment & Plan Note (Signed)
Due for surveillance colonoscopy at this time. Plan for deep sedation in the OR given polypharmacy.  I have discussed the risks, alternatives, benefits with regards to but not limited to the risk of reaction to medication, bleeding, infection, perforation and the patient is agreeable to proceed. Written consent to be obtained.

## 2016-10-13 ENCOUNTER — Other Ambulatory Visit (HOSPITAL_COMMUNITY): Payer: Self-pay | Admitting: Internal Medicine

## 2016-10-13 DIAGNOSIS — N6310 Unspecified lump in the right breast, unspecified quadrant: Secondary | ICD-10-CM

## 2016-10-19 ENCOUNTER — Encounter (HOSPITAL_COMMUNITY): Payer: Self-pay

## 2016-10-19 ENCOUNTER — Other Ambulatory Visit (HOSPITAL_COMMUNITY): Payer: Self-pay | Admitting: Internal Medicine

## 2016-10-19 ENCOUNTER — Ambulatory Visit (HOSPITAL_COMMUNITY)
Admission: RE | Admit: 2016-10-19 | Discharge: 2016-10-19 | Disposition: A | Discharge status: Home / Self Care | Payer: Medicaid Other | Source: Ambulatory Visit | Attending: Internal Medicine | Admitting: Internal Medicine

## 2016-10-19 DIAGNOSIS — N6310 Unspecified lump in the right breast, unspecified quadrant: Secondary | ICD-10-CM

## 2016-10-19 DIAGNOSIS — D493 Neoplasm of unspecified behavior of breast: Secondary | ICD-10-CM | POA: Insufficient documentation

## 2016-10-19 DIAGNOSIS — Z853 Personal history of malignant neoplasm of breast: Secondary | ICD-10-CM | POA: Insufficient documentation

## 2016-10-22 NOTE — Patient Instructions (Signed)
Sydney Blair  10/22/2016     '@PREFPERIOPPHARMACY'$ @   Your procedure is scheduled on  11/01/2016  Report to [facility] at  72   A.M.  Call this number if you have problems the morning of surgery:  [phone]   Remember:  Do not eat food or drink liquids after midnight.  Take these medicines the morning of surgery with A SIP OF WATER  Zyrtec, dexilant, cymbalta,neurontin, levothyroxine, antivert, tapentadol, trintellix.   Do not wear jewelry, make-up or nail polish.  Do not wear lotions, powders, or perfumes, or deoderant.  Do not shave 48 hours prior to surgery.  Men may shave face and neck.  Do not bring valuables to the hospital.  [facility] is not responsible for any belongings or valuables.  Contacts, dentures or bridgework may not be worn into surgery.  Leave your suitcase in the car.  After surgery it may be brought to your room.  For patients admitted to the hospital, discharge time will be determined by your treatment team.  Patients discharged the day of surgery will not be allowed to drive home.   Name and phone number of your driver:   family Special instructions:  Follow the diet and prep instructions given to you by Dr Roseanne Kaufman office.  Please read over the following fact sheets that you were given. Anesthesia Post-op Instructions and Care and Recovery After Surgery       Esophagogastroduodenoscopy Esophagogastroduodenoscopy (EGD) is a procedure to examine the lining of the esophagus, stomach, and first part of the small intestine (duodenum). This procedure is done to check for problems such as inflammation, bleeding, ulcers, or growths. During this procedure, a long, flexible, lighted tube with a camera attached (endoscope) is inserted down the throat. Tell a health care provider about:  Any allergies you have.  All medicines you are taking, including vitamins, herbs, eye drops, creams, and over-the-counter medicines.  Any  problems you or family members have had with anesthetic medicines.  Any blood disorders you have.  Any surgeries you have had.  Any medical conditions you have.  Whether you are pregnant or may be pregnant. What are the risks? Generally, this is a safe procedure. However, problems may occur, including:  Infection.  Bleeding.  A tear (perforation) in the esophagus, stomach, or duodenum.  Trouble breathing.  Excessive sweating.  Spasms of the larynx.  A slowed heartbeat.  Low blood pressure. What happens before the procedure?  Follow instructions from your health care provider about eating or drinking restrictions.  Ask your health care provider about:  Changing or stopping your regular medicines. This is especially important if you are taking diabetes medicines or blood thinners.  Taking medicines such as aspirin and ibuprofen. These medicines can thin your blood. Do not take these medicines before your procedure if your health care provider instructs you not to.  Plan to have someone take you home after the procedure.  If you wear dentures, be ready to remove them before the procedure. What happens during the procedure?  To reduce your risk of infection, your health care team will wash or sanitize their hands.  An IV tube will be put in a vein in your hand or arm. You will get medicines and fluids through this tube.  You will be given one or more of the following:  A medicine to help you relax (sedative).  A medicine to numb the area (local  anesthetic). This medicine may be sprayed into your throat. It will make you feel more comfortable and keep you from gagging or coughing during the procedure.  A medicine for pain.  A mouth guard may be placed in your mouth to protect your teeth and to keep you from biting on the endoscope.  You will be asked to lie on your left side.  The endoscope will be lowered down your throat into your esophagus, stomach, and  duodenum.  Air will be put into the endoscope. This will help your health care provider see better.  The lining of your esophagus, stomach, and duodenum will be examined.  Your health care provider may:  Take a tissue sample so it can be looked at in a lab (biopsy).  Remove growths.  Remove objects (foreign bodies) that are stuck.  Treat any bleeding with medicines or other devices that stop tissue from bleeding.  Widen (dilate) or stretch narrowed areas of your esophagus and stomach.  The endoscope will be taken out. The procedure may vary among health care providers and hospitals. What happens after the procedure?  Your blood pressure, heart rate, breathing rate, and blood oxygen level will be monitored often until the medicines you were given have worn off.  Do not eat or drink anything until the numbing medicine has worn off and your gag reflex has returned. This information is not intended to replace advice given to you by your health care provider. Make sure you discuss any questions you have with your health care provider. Document Released: 12/10/2004 Document Revised: 01/15/2016 Document Reviewed: 07/03/2015 Elsevier Interactive Patient Education  2017 [place]. Esophagogastroduodenoscopy, Care After Refer to this sheet in the next few weeks. These instructions provide you with information about caring for yourself after your procedure. Your health care provider may also give you more specific instructions. Your treatment has been planned according to current medical practices, but problems sometimes occur. Call your health care provider if you have any problems or questions after your procedure. What can I expect after the procedure? After the procedure, it is common to have:  A sore throat.  Nausea.  Bloating.  Dizziness.  Fatigue. Follow these instructions at home:  Do not eat or drink anything until the numbing medicine (local anesthetic) has worn off and  your gag reflex has returned. You will know that the local anesthetic has worn off when you can swallow comfortably.  Do not drive for 24 hours if you received a medicine to help you relax (sedative).  If your health care provider took a tissue sample for testing during the procedure, make sure to get your test results. This is your responsibility. Ask your health care provider or the department performing the test when your results will be ready.  Keep all follow-up visits as told by your health care provider. This is important. Contact a health care provider if:  You cannot stop coughing.  You are not urinating.  You are urinating less than usual. Get help right away if:  You have trouble swallowing.  You cannot eat or drink.  You have throat or chest pain that gets worse.  You are dizzy or light-headed.  You faint.  You have nausea or vomiting.  You have chills.  You have a fever.  You have severe abdominal pain.  You have black, tarry, or bloody stools. This information is not intended to replace advice given to you by your health care provider. Make sure you discuss any questions  you have with your health care provider. Document Released: 07/26/2012 Document Revised: 01/15/2016 Document Reviewed: 07/03/2015 Elsevier Interactive Patient Education  2017 [employer]  Esophageal Dilatation Esophageal dilatation is a procedure to open a blocked or narrowed part of the esophagus. The esophagus is the long tube in your throat that carries food and liquid from your mouth to your stomach. The procedure is also called esophageal dilation. You may need this procedure if you have a buildup of scar tissue in your esophagus that makes it difficult, painful, or even impossible to swallow. This can be caused by gastroesophageal reflux disease (GERD). In rare cases, people need this procedure because they have cancer of the esophagus or a problem with the way food moves through the  esophagus. Sometimes you may need to have another dilatation to enlarge the opening of the esophagus gradually. Tell a health care provider about:  Any allergies you have.  All medicines you are taking, including vitamins, herbs, eye drops, creams, and over-the-counter medicines.  Any problems you or family members have had with anesthetic medicines.  Any blood disorders you have.  Any surgeries you have had.  Any medical conditions you have.  Any antibiotic medicines you are required to take before dental procedures. What are the risks? Generally, this is a safe procedure. However, problems can occur and include:  Bleeding from a tear in the lining of the esophagus.  A hole (perforation) in the esophagus. What happens before the procedure?  Do not eat or drink anything after midnight on the night before the procedure or as directed by your health care provider.  Ask your health care provider about changing or stopping your regular medicines. This is especially important if you are taking diabetes medicines or blood thinners.  Plan to have someone take you home after the procedure. What happens during the procedure?  You will be given a medicine that makes you relaxed and sleepy (sedative).  A medicine may be sprayed or gargled to numb the back of the throat.  Your health care provider can use various instruments to do an esophageal dilatation. During the procedure, the instrument used will be placed in your mouth and passed down into your esophagus. Options include:  Simple dilators. This instrument is carefully placed in the esophagus to stretch it.  Guided wire bougies. In this method, a flexible tube (endoscope) is used to insert a wire into the esophagus. The dilator is passed over this wire to enlarge the esophagus. Then the wire is removed.  Balloon dilators. An endoscope with a small balloon at the end is passed down into the esophagus. Inflating the balloon gently  stretches the esophagus and opens it up. What happens after the procedure?  Your blood pressure, heart rate, breathing rate, and blood oxygen level will be monitored often until the medicines you were given have worn off.  Your throat may feel slightly sore and will probably still feel numb. This will improve slowly over time.  You will not be allowed to eat or drink until the throat numbness has resolved.  If this is a same-day procedure, you may be allowed to go home once you have been able to drink, urinate, and sit on the edge of the bed without nausea or dizziness.  If this is a same-day procedure, you should have a friend or family member with you for the next 24 hours after the procedure. This information is not intended to replace advice given to you by your health care  provider. Make sure you discuss any questions you have with your health care provider. Document Released: 09/30/2005 Document Revised: 01/15/2016 Document Reviewed: 12/19/2013 Elsevier Interactive Patient Education  2017 [place].  Colonoscopy, Adult A colonoscopy is an exam to look at the entire large intestine. During the exam, a lubricated, bendable tube is inserted into the anus and then passed into the rectum, colon, and other parts of the large intestine. A colonoscopy is often done as a part of normal colorectal screening or in response to certain symptoms, such as anemia, persistent diarrhea, abdominal pain, and blood in the stool. The exam can help screen for and diagnose medical problems, including:  Tumors.  Polyps.  Inflammation.  Areas of bleeding. Tell a health care provider about:  Any allergies you have.  All medicines you are taking, including vitamins, herbs, eye drops, creams, and over-the-counter medicines.  Any problems you or family members have had with anesthetic medicines.  Any blood disorders you have.  Any surgeries you have had.  Any medical conditions you have.  Any  problems you have had passing stool. What are the risks? Generally, this is a safe procedure. However, problems may occur, including:  Bleeding.  A tear in the intestine.  A reaction to medicines given during the exam.  Infection (rare). What happens before the procedure? Eating and drinking restrictions  Follow instructions from your health care provider about eating and drinking, which may include:  A few days before the procedure - follow a low-fiber diet. Avoid nuts, seeds, dried fruit, raw fruits, and vegetables.  1-3 days before the procedure - follow a clear liquid diet. Drink only clear liquids, such as clear broth or bouillon, black coffee or tea, clear juice, clear soft drinks or sports drinks, gelatin dessert, and popsicles. Avoid any liquids that contain red or purple dye.  On the day of the procedure - do not eat or drink anything during the 2 hours before the procedure, or within the time period that your health care provider recommends. Bowel prep  If you were prescribed an oral bowel prep to clean out your colon:  Take it as told by your health care provider. Starting the day before your procedure, you will need to drink a large amount of medicated liquid. The liquid will cause you to have multiple loose stools until your stool is almost clear or light green.  If your skin or anus gets irritated from diarrhea, you may use these to relieve the irritation:  Medicated wipes, such as adult wet wipes with aloe and vitamin E.  A skin soothing-product like petroleum jelly.  If you vomit while drinking the bowel prep, take a break for up to 60 minutes and then begin the bowel prep again. If vomiting continues and you cannot take the bowel prep without vomiting, call your health care provider. General instructions   Ask your health care provider about changing or stopping your regular medicines. This is especially important if you are taking diabetes medicines or blood  thinners.  Plan to have someone take you home from the hospital or clinic. What happens during the procedure?  An IV tube may be inserted into one of your veins.  You will be given medicine to help you relax (sedative).  To reduce your risk of infection:  Your health care team will wash or sanitize their hands.  Your anal area will be washed with soap.  You will be asked to lie on your side with your knees bent.  Your health care provider will lubricate a long, thin, flexible tube. The tube will have a camera and a light on the end.  The tube will be inserted into your anus.  The tube will be gently eased through your rectum and colon.  Air will be delivered into your colon to keep it open. You may feel some pressure or cramping.  The camera will be used to take images during the procedure.  A small tissue sample may be removed from your body to be examined under a microscope (biopsy). If any potential problems are found, the tissue will be sent to a lab for testing.  If small polyps are found, your health care provider may remove them and have them checked for cancer cells.  The tube that was inserted into your anus will be slowly removed. The procedure may vary among health care providers and hospitals. What happens after the procedure?  Your blood pressure, heart rate, breathing rate, and blood oxygen level will be monitored until the medicines you were given have worn off.  Do not drive for 24 hours after the exam.  You may have a small amount of blood in your stool.  You may pass gas and have mild abdominal cramping or bloating due to the air that was used to inflate your colon during the exam.  It is up to you to get the results of your procedure. Ask your health care provider, or the department performing the procedure, when your results will be ready. This information is not intended to replace advice given to you by your health care provider. Make sure you discuss  any questions you have with your health care provider. Document Released: 08/06/2000 Document Revised: 06/09/2016 Document Reviewed: 10/21/2015 Elsevier Interactive Patient Education  2017 [employer]  Colonoscopy, Adult, Care After This sheet gives you information about how to care for yourself after your procedure. Your health care provider may also give you more specific instructions. If you have problems or questions, contact your health care provider. What can I expect after the procedure? After the procedure, it is common to have:  A small amount of blood in your stool for 24 hours after the procedure.  Some gas.  Mild abdominal cramping or bloating. Follow these instructions at home: General instructions    For the first 24 hours after the procedure:  Do not drive or use machinery.  Do not sign important documents.  Do not drink alcohol.  Do your regular daily activities at a slower pace than normal.  Eat soft, easy-to-digest foods.  Rest often.  Take over-the-counter or prescription medicines only as told by your health care provider.  It is up to you to get the results of your procedure. Ask your health care provider, or the department performing the procedure, when your results will be ready. Relieving cramping and bloating   Try walking around when you have cramps or feel bloated.  Apply heat to your abdomen as told by your health care provider. Use a heat source that your health care provider recommends, such as a moist heat pack or a heating pad.  Place a towel between your skin and the heat source.  Leave the heat on for 20-30 minutes.  Remove the heat if your skin turns bright red. This is especially important if you are unable to feel pain, heat, or cold. You may have a greater risk of getting burned. Eating and drinking   Drink enough fluid to keep your urine clear  or pale yellow.  Resume your normal diet as instructed by your health care  provider. Avoid heavy or fried foods that are hard to digest.  Avoid drinking alcohol for as long as instructed by your health care provider. Contact a health care provider if:  You have blood in your stool 2-3 days after the procedure. Get help right away if:  You have more than a small spotting of blood in your stool.  You pass large blood clots in your stool.  Your abdomen is swollen.  You have nausea or vomiting.  You have a fever.  You have increasing abdominal pain that is not relieved with medicine. This information is not intended to replace advice given to you by your health care provider. Make sure you discuss any questions you have with your health care provider. Document Released: 03/23/2004 Document Revised: 05/03/2016 Document Reviewed: 10/21/2015 Elsevier Interactive Patient Education  2017 [place] Anesthesia is a term that refers to techniques, procedures, and medicines that help a person stay safe and comfortable during a medical procedure. Monitored anesthesia care, or sedation, is one type of anesthesia. Your anesthesia specialist may recommend sedation if you will be having a procedure that does not require you to be unconscious, such as:  Cataract surgery.  A dental procedure.  A biopsy.  A colonoscopy. During the procedure, you may receive a medicine to help you relax (sedative). There are three levels of sedation:  Mild sedation. At this level, you may feel awake and relaxed. You will be able to follow directions.  Moderate sedation. At this level, you will be sleepy. You may not remember the procedure.  Deep sedation. At this level, you will be asleep. You will not remember the procedure. The more medicine you are given, the deeper your level of sedation will be. Depending on how you respond to the procedure, the anesthesia specialist may change your level of sedation or the type of anesthesia to fit your needs. An  anesthesia specialist will monitor you closely during the procedure. Let your health care provider know about:  Any allergies you have.  All medicines you are taking, including vitamins, herbs, eye drops, creams, and over-the-counter medicines.  Any use of steroids (by mouth or as a cream).  Any problems you or family members have had with sedatives and anesthetic medicines.  Any blood disorders you have.  Any surgeries you have had.  Any medical conditions you have, such as sleep apnea.  Whether you are pregnant or may be pregnant.  Any use of cigarettes, alcohol, or street drugs. What are the risks? Generally, this is a safe procedure. However, problems may occur, including:  Getting too much medicine (oversedation).  Nausea.  Allergic reaction to medicines.  Trouble breathing. If this happens, a breathing tube may be used to help with breathing. It will be removed when you are awake and breathing on your own.  Heart trouble.  Lung trouble. Before the procedure Staying hydrated  Follow instructions from your health care provider about hydration, which may include:  Up to 2 hours before the procedure - you may continue to drink clear liquids, such as water, clear fruit juice, black coffee, and plain tea. Eating and drinking restrictions  Follow instructions from your health care provider about eating and drinking, which may include:  8 hours before the procedure - stop eating heavy meals or foods such as meat, fried foods, or fatty foods.  6 hours before the procedure -  stop eating light meals or foods, such as toast or cereal.  6 hours before the procedure - stop drinking milk or drinks that contain milk.  2 hours before the procedure - stop drinking clear liquids. Medicines  Ask your health care provider about:  Changing or stopping your regular medicines. This is especially important if you are taking diabetes medicines or blood thinners.  Taking medicines  such as aspirin and ibuprofen. These medicines can thin your blood. Do not take these medicines before your procedure if your health care provider instructs you not to. Tests and exams  You will have a physical exam.  You may have blood tests done to show:  How well your kidneys and liver are working.  How well your blood can clot.  General instructions  Plan to have someone take you home from the hospital or clinic.  If you will be going home right after the procedure, plan to have someone with you for 24 hours. What happens during the procedure?  Your blood pressure, heart rate, breathing, level of pain and overall condition will be monitored.  An IV tube will be inserted into one of your veins.  Your anesthesia specialist will give you medicines as needed to keep you comfortable during the procedure. This may mean changing the level of sedation.  The procedure will be performed. After the procedure  Your blood pressure, heart rate, breathing rate, and blood oxygen level will be monitored until the medicines you were given have worn off.  Do not drive for 24 hours if you received a sedative.  You may:  Feel sleepy, clumsy, or nauseous.  Feel forgetful about what happened after the procedure.  Have a sore throat if you had a breathing tube during the procedure.  Vomit. This information is not intended to replace advice given to you by your health care provider. Make sure you discuss any questions you have with your health care provider. Document Released: 05/05/2005 Document Revised: 01/16/2016 Document Reviewed: 11/30/2015 Elsevier Interactive Patient Education  2017 [place], Care After These instructions provide you with information about caring for yourself after your procedure. Your health care provider may also give you more specific instructions. Your treatment has been planned according to current medical practices, but problems  sometimes occur. Call your health care provider if you have any problems or questions after your procedure. What can I expect after the procedure? After your procedure, it is common to:  Feel sleepy for several hours.  Feel clumsy and have poor balance for several hours.  Feel forgetful about what happened after the procedure.  Have poor judgment for several hours.  Feel nauseous or vomit.  Have a sore throat if you had a breathing tube during the procedure. Follow these instructions at home: For at least 24 hours after the procedure:    Do not:  Participate in activities in which you could fall or become injured.  Drive.  Use heavy machinery.  Drink alcohol.  Take sleeping pills or medicines that cause drowsiness.  Make important decisions or sign legal documents.  Take care of children on your own.  Rest. Eating and drinking   Follow the diet that is recommended by your health care provider.  If you vomit, drink water, juice, or soup when you can drink without vomiting.  Make sure you have little or no nausea before eating solid foods. General instructions   Have a responsible adult stay with you until you are awake  and alert.  Take over-the-counter and prescription medicines only as told by your health care provider.  If you smoke, do not smoke without supervision.  Keep all follow-up visits as told by your health care provider. This is important. Contact a health care provider if:  You keep feeling nauseous or you keep vomiting.  You feel light-headed.  You develop a rash.  You have a fever. Get help right away if:  You have trouble breathing. This information is not intended to replace advice given to you by your health care provider. Make sure you discuss any questions you have with your health care provider. Document Released: 11/30/2015 Document Revised: 03/31/2016 Document Reviewed: 11/30/2015 Elsevier Interactive Patient Education  2017  [employer].

## 2016-10-25 ENCOUNTER — Other Ambulatory Visit (HOSPITAL_COMMUNITY): Payer: Self-pay | Admitting: Internal Medicine

## 2016-10-25 DIAGNOSIS — N6310 Unspecified lump in the right breast, unspecified quadrant: Secondary | ICD-10-CM

## 2016-10-26 ENCOUNTER — Encounter (HOSPITAL_COMMUNITY)
Admission: RE | Admit: 2016-10-26 | Discharge: 2016-10-26 | Disposition: A | Discharge status: Home / Self Care | Payer: Medicaid Other | Source: Ambulatory Visit | Attending: Internal Medicine | Admitting: Internal Medicine

## 2016-10-26 ENCOUNTER — Encounter (HOSPITAL_COMMUNITY): Payer: Self-pay

## 2016-10-26 DIAGNOSIS — Z01810 Encounter for preprocedural cardiovascular examination: Secondary | ICD-10-CM | POA: Diagnosis present

## 2016-10-26 DIAGNOSIS — Z01812 Encounter for preprocedural laboratory examination: Secondary | ICD-10-CM | POA: Diagnosis present

## 2016-10-26 HISTORY — DX: Depression, unspecified: F32.A

## 2016-10-26 HISTORY — DX: Sleep apnea, unspecified: G47.30

## 2016-10-26 HISTORY — DX: Pure hypercholesterolemia, unspecified: E78.00

## 2016-10-26 HISTORY — DX: Major depressive disorder, single episode, unspecified: F32.9

## 2016-10-26 HISTORY — DX: Anxiety disorder, unspecified: F41.9

## 2016-10-26 HISTORY — DX: Hypothyroidism, unspecified: E03.9

## 2016-10-26 HISTORY — DX: Polyneuropathy, unspecified: G62.9

## 2016-10-26 LAB — BASIC METABOLIC PANEL
ANION GAP: 9 (ref 5–15)
BUN: 8 mg/dL (ref 6–20)
CALCIUM: 9.5 mg/dL (ref 8.9–10.3)
CHLORIDE: 97 mmol/L — AB (ref 101–111)
CO2: 31 mmol/L (ref 22–32)
Creatinine, Ser: 0.72 mg/dL (ref 0.44–1.00)
GFR calc non Af Amer: >60 mL/min (ref >60)
Glucose, Bld: 97 mg/dL (ref 65–99)
Potassium: 3 mmol/L — ABNORMAL LOW (ref 3.5–5.1)
Sodium: 137 mmol/L (ref 135–145)

## 2016-10-26 LAB — CBC WITH DIFFERENTIAL/PLATELET
BASOS ABS: 0 10*3/uL (ref 0.0–0.1)
BASOS PCT: 0 %
Eosinophils Absolute: 0.2 10*3/uL (ref 0.0–0.7)
Eosinophils Relative: 2 %
HEMATOCRIT: 44.9 % (ref 36.0–46.0)
HEMOGLOBIN: 14.3 g/dL (ref 12.0–15.0)
Lymphocytes Relative: 18 %
Lymphs Abs: 1.7 10*3/uL (ref 0.7–4.0)
MCH: 27.3 pg (ref 26.0–34.0)
MCHC: 31.8 g/dL (ref 30.0–36.0)
MCV: 85.9 fL (ref 78.0–100.0)
Monocytes Absolute: 0.6 10*3/uL (ref 0.1–1.0)
Monocytes Relative: 7 %
NEUTROS ABS: 6.8 10*3/uL (ref 1.7–7.7)
NEUTROS PCT: 73 %
Platelets: 245 10*3/uL (ref 150–400)
RBC: 5.23 MIL/uL — AB (ref 3.87–5.11)
RDW: 16.2 % — AB (ref 11.5–15.5)
WBC: 9.3 10*3/uL (ref 4.0–10.5)

## 2016-11-01 ENCOUNTER — Encounter (HOSPITAL_COMMUNITY): Admission: RE | Payer: Self-pay | Source: Ambulatory Visit

## 2016-11-01 ENCOUNTER — Telehealth: Payer: Self-pay | Admitting: General Practice

## 2016-11-01 ENCOUNTER — Encounter: Payer: Self-pay | Admitting: Internal Medicine

## 2016-11-01 ENCOUNTER — Other Ambulatory Visit: Payer: Self-pay | Admitting: Gastroenterology

## 2016-11-01 ENCOUNTER — Ambulatory Visit (HOSPITAL_COMMUNITY): Admission: RE | Admit: 2016-11-01 | Payer: Medicaid Other | Source: Ambulatory Visit | Admitting: Internal Medicine

## 2016-11-01 SURGERY — COLONOSCOPY WITH PROPOFOL
Anesthesia: Monitor Anesthesia Care

## 2016-11-01 MED ORDER — POTASSIUM CHLORIDE ER 10 MEQ PO TBCR: 20.0000 meq | EXTENDED_RELEASE_TABLET | Freq: Two times a day (BID) | ORAL | 0 refills | Status: DC

## 2016-11-01 NOTE — Progress Notes (Signed)
addressed

## 2016-11-01 NOTE — Telephone Encounter (Signed)
-----   Message from Daneil Dolin, MD sent at 11/01/2016  9:13 AM EDT ----- Patient did not show for procedures today. Patient needs an office visit. Before rescheduling.

## 2016-11-01 NOTE — Progress Notes (Signed)
This information was routed to me Friday after I was no longer working.   Ok to send in KCL 32mq po BID for 3 days. #6 no refills.

## 2016-11-01 NOTE — Telephone Encounter (Signed)
APPT MADE AND LETTER SENT  °

## 2016-11-01 NOTE — Telephone Encounter (Signed)
Routing to [facility] to schedule ov for procedure

## 2016-11-02 ENCOUNTER — Ambulatory Visit (HOSPITAL_COMMUNITY)
Admission: RE | Admit: 2016-11-02 | Discharge: 2016-11-02 | Disposition: A | Discharge status: Home / Self Care | Payer: Medicaid Other | Source: Ambulatory Visit | Attending: Internal Medicine | Admitting: Internal Medicine

## 2016-11-02 ENCOUNTER — Other Ambulatory Visit (HOSPITAL_COMMUNITY): Payer: Self-pay | Admitting: Internal Medicine

## 2016-11-02 DIAGNOSIS — N6312 Unspecified lump in the right breast, upper inner quadrant: Secondary | ICD-10-CM | POA: Insufficient documentation

## 2016-11-02 DIAGNOSIS — N6310 Unspecified lump in the right breast, unspecified quadrant: Secondary | ICD-10-CM

## 2016-11-02 MED ORDER — LIDOCAINE HCL (PF) 1 % IJ SOLN
INTRAMUSCULAR | Status: AC
  Filled 2016-11-02: qty 5

## 2016-11-02 MED ORDER — LIDOCAINE-EPINEPHRINE (PF) 1 %-1:200000 IJ SOLN
INTRAMUSCULAR | Status: AC
  Filled 2016-11-02: qty 30

## 2016-11-04 ENCOUNTER — Telehealth: Payer: Self-pay | Admitting: Internal Medicine

## 2016-11-04 NOTE — Progress Notes (Signed)
PT is aware and has already picked up the potassium and taking.

## 2016-11-04 NOTE — Telephone Encounter (Signed)
Talked with her and she understands everything

## 2016-11-04 NOTE — Telephone Encounter (Signed)
Pt called and has questions about her potassium and questions about her prescription for potassium. Please call her at [phone]

## 2016-11-23 ENCOUNTER — Encounter: Payer: Self-pay | Admitting: Gastroenterology

## 2016-11-23 ENCOUNTER — Ambulatory Visit: Payer: Medicaid Other | Admitting: Gastroenterology

## 2016-11-23 ENCOUNTER — Telehealth: Payer: Self-pay | Admitting: Gastroenterology

## 2016-11-23 NOTE — Telephone Encounter (Signed)
PATIENT WAS A NO SHOW AND LETTER SENT  °

## 2016-12-02 ENCOUNTER — Encounter: Payer: Self-pay | Admitting: General Surgery

## 2016-12-02 ENCOUNTER — Ambulatory Visit (INDEPENDENT_AMBULATORY_CARE_PROVIDER_SITE_OTHER): Payer: Medicaid Other | Admitting: General Surgery

## 2016-12-02 VITALS — BP 141/80 | HR 91 | Temp 97.8°F | Resp 18 | Ht 60.0 in | Wt 176.0 lb

## 2016-12-02 DIAGNOSIS — N6310 Unspecified lump in the right breast, unspecified quadrant: Secondary | ICD-10-CM

## 2016-12-02 DIAGNOSIS — N6312 Unspecified lump in the right breast, upper inner quadrant: Secondary | ICD-10-CM

## 2016-12-02 NOTE — Progress Notes (Signed)
Sydney Blair; [id number]; [birth year]   HPI Patient is a 61 year old white female status post right partial mastectomy with lymph node dissection, chemotherapy, and radiation therapy in 2005 who presents with a sudden thickening of the right breast at the surgical site. This occurred in February 2018. She underwent biopsy of this region by the breast center which was negative for malignancy. She has had no nipple discharge. The thickness of the skin and soft tissue in the upper, inner quadrant of the right breast is still present. It is tender to palpation and her pain is 5 out of 10. Past Medical History:  Diagnosis Date  . Anxiety   . ARF (acute renal failure) ([place]) 08/07/2011  . Cancer ([place]) 2005   breast-right  . Chronic back pain   . COPD (chronic obstructive pulmonary disease) ([place])   . Depression   . Depression with anxiety   . Fibromyalgia   . GERD (gastroesophageal reflux disease)   . Hypercholesteremia   . Hypothyroidism   . Incontinence   . Neuropathy ([place])   . Peripheral neuropathy ([place])   . Sciatic pain   . Sebaceous cyst    lt labial location  . Sleep apnea    uses CPAP  . Tobacco abuse     Past Surgical History:  Procedure Laterality Date  . bladder tack    . BREAST LUMPECTOMY     right with node dissection  . COLONOSCOPY  11/11/2010   POE:[id number] rectal polyp and splenic flexure otherwise normal; pathology showed  tubular adenomas. Next TCS 10/2015.  Marland Kitchen ESOPHAGOGASTRODUODENOSCOPY  11/11/2010   XVQ:MGQQP hiatal hernia, 30 French dilator  . ESOPHAGOGASTRODUODENOSCOPY (EGD) WITH ESOPHAGEAL DILATION N/A 06/21/2013   YPP:[id number] esophagus  - status post Maloney dilation s/p bx (benign esophageal bx)    Family History  Problem Relation Age of Onset  . Cancer Other   . Heart attack Other   . Lung disease Other     Current Outpatient Prescriptions on File Prior to Visit  Medication Sig Dispense Refill  . albuterol (PROVENTIL HFA;VENTOLIN HFA) 108 (90  BASE) MCG/ACT inhaler Inhale 2 puffs into the lungs every 6 (six) hours as needed for wheezing or shortness of breath.    Marland Kitchen aspirin 81 MG chewable tablet Chew 81 mg by mouth daily.    . budesonide-formoterol (SYMBICORT) 160-4.5 MCG/ACT inhaler Inhale 2 puffs into the lungs 2 (two) times daily.    . Ca Carbonate-Mag Hydroxide (ROLAIDS PO) Take 1 tablet by mouth 3 (three) times daily as needed (for heartburn/indigestion.).     Marland Kitchen cetirizine (ZYRTEC) 10 MG tablet Take 10 mg by mouth daily.    Marland Kitchen dexlansoprazole (DEXILANT) 60 MG capsule Take 1 capsule (60 mg total) by mouth daily before breakfast. 30 capsule 11  . dicyclomine (BENTYL) 10 MG capsule TAKE 1 CAPSULE BY MOUTH FOUR TIMES DAILY BEFORE MEALS AND AT BEDTIME. HOLD IF CONSTIPATED 120 capsule 3  . DULoxetine (CYMBALTA) 60 MG capsule Take 60 mg by mouth daily.    . fluticasone (FLONASE) 50 MCG/ACT nasal spray Place 2 sprays into both nostrils daily.    Marland Kitchen gabapentin (NEURONTIN) 800 MG tablet Take 800 mg by mouth 4 (four) times daily.  2  . ibuprofen (ADVIL,MOTRIN) 800 MG tablet Take 800 mg by mouth every 8 (eight) hours as needed (for pain.).     Marland Kitchen levothyroxine (SYNTHROID, LEVOTHROID) 100 MCG tablet Take 100 mcg by mouth daily before breakfast. Reported on 02/03/2016    . meclizine (ANTIVERT) 12.5 MG tablet  Take 12.5 mg by mouth 2 (two) times daily as needed for dizziness.     . meloxicam (MOBIC) 15 MG tablet Take 15 mg by mouth daily. Reported on 02/03/2016    . methocarbamol (ROBAXIN) 500 MG tablet Take 500 mg by mouth 4 (four) times daily as needed for muscle spasms.    . nortriptyline (PAMELOR) 25 MG capsule Take 25 mg by mouth at bedtime.    . polyethylene glycol-electrolytes (TRILYTE) 420 g solution Take 4,000 mLs by mouth as directed. 4000 mL 0  . potassium chloride (K-DUR) 10 MEQ tablet Take 2 tablets (20 mEq total) by mouth 2 (two) times daily. For 3 days 12 tablet 0  . pravastatin (PRAVACHOL) 40 MG tablet Take 40 mg by mouth every evening.     . Tapentadol HCl (NUCYNTA) 100 MG TABS Take 100 mg by mouth 2 (two) times daily.    Marland Kitchen tiotropium (SPIRIVA) 18 MCG inhalation capsule Place 1 capsule (18 mcg total) into inhaler and inhale daily. 30 capsule 6  . TRINTELLIX 10 MG TABS Take 10 mg by mouth daily.  5  . zolpidem (AMBIEN) 5 MG tablet Take 10 mg by mouth at bedtime as needed for sleep.      No current facility-administered medications on file prior to visit.     Allergies  Allergen Reactions  . Penicillins Anaphylaxis    Has patient had a PCN reaction causing immediate rash, facial/tongue/throat swelling, SOB or lightheadedness with hypotension:Yes Has patient had a PCN reaction causing severe rash involving mucus membranes or skin necrosis:Yes Has patient had a PCN reaction that required hospitalization:No Has patient had a PCN reaction occurring within the last 10 years:No If all of the above answers are "NO", then may proceed with Cephalosporin use.     History  Alcohol Use No    History  Smoking Status  . Current Every Day Smoker  . Packs/day: 1.00  . Years: 35.00  . Types: Cigarettes  Smokeless Tobacco  . Never Used    Comment: one pack daily    Review of Systems  Constitutional: Positive for malaise/fatigue.  HENT: Positive for sinus pain.   Eyes: Negative.   Respiratory: Positive for cough, shortness of breath and wheezing.   Cardiovascular: Negative.   Gastrointestinal: Positive for heartburn.  Genitourinary: Negative.   Musculoskeletal: Positive for back pain, joint pain and neck pain.  Skin: Negative.   Neurological: Positive for dizziness and headaches.  Endo/Heme/Allergies: Negative.   Psychiatric/Behavioral: Negative.     Objective   Vitals:   12/02/16 0957  BP: (!) 141/80  Pulse: 91  Resp: 18  Temp: 97.8 F (36.6 C)    Physical Exam  Constitutional: She is oriented to person, place, and time and well-developed, well-nourished, and in no distress.  HENT:  Head: Normocephalic and  atraumatic.  Neck: Normal range of motion. Neck supple.  Cardiovascular: Normal rate, regular rhythm and normal heart sounds.   No murmur heard. Pulmonary/Chest: Effort normal. She has wheezes. She has no rales.  Genitourinary:  Genitourinary Comments: Right breast is smaller than the left breast. A surgical scars present in the upper, inner quadrant of the right breast. Thickened skin with increased soft tissue density is noted in this region. It is indistinct in nature. No nipple discharge is noted. Right axilla is negative for palpable nodes. Left breast reveals no dominant mass, nipple discharge, dimpling. Axilla is negative for palpable nodes.  Lymphadenopathy:    She has no cervical adenopathy.  Neurological: She is  alert and oriented to person, place, and time.  Skin: Skin is warm and dry.  Vitals reviewed.  mammogram, ultrasound, and biopsy pathology results reviewed.  Assessment  Right breast mass, history of right breast malignancy Plan   Even though the core biopsy was negative, patient needs a right breast biopsy including skin to rule out recurrent right breast cancer. Patient understands and agrees. The patient scheduled for right breast biopsy on 12/13/2016. The risks and benefits of the procedure including bleeding and infection were fully explained to the patient, who gave informed consent.

## 2016-12-02 NOTE — H&P (Signed)
Sydney Blair; [id number]; 1956-04-05   HPI Patient is a 61 year old white female status post right partial mastectomy with lymph node dissection, chemotherapy, and radiation therapy in 2005 who presents with a sudden thickening of the right breast at the surgical site. This occurred in February 2018. She underwent biopsy of this region by the breast center which was negative for malignancy. She has had no nipple discharge. The thickness of the skin and soft tissue in the upper, inner quadrant of the right breast is still present. It is tender to palpation and her pain is 5 out of 10.     Past Medical History:  Diagnosis Date  . Anxiety   . ARF (acute renal failure) ([place]) 08/07/2011  . 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 Chronic back pain   . COPD (chronic obstructive pulmonary disease) ([place])   . Depression   . Depression with anxiety   . Fibromyalgia   . GERD (gastroesophageal reflux disease)   . Hypercholesteremia   . Hypothyroidism   . Incontinence   . Neuropathy ([place])   . Peripheral neuropathy ([place])   . Sciatic pain   . Sebaceous cyst    lt labial location  . Sleep apnea    uses CPAP  . Tobacco abuse          Past Surgical History:  Procedure Laterality Date  . bladder tack    . BREAST LUMPECTOMY     right with node dissection  . COLONOSCOPY  11/11/2010   HAL:[id number] rectal polyp and splenic flexure otherwise normal; pathology showed  tubular adenomas. Next TCS 10/2015.  Marland Kitchen ESOPHAGOGASTRODUODENOSCOPY  11/11/2010   DZH:GDJME hiatal hernia, 64 French dilator  . ESOPHAGOGASTRODUODENOSCOPY (EGD) WITH ESOPHAGEAL DILATION N/A 06/21/2013   QAS:[id number] esophagus  - status post Maloney dilation s/p bx (benign esophageal bx)         Family History  Problem Relation Age of Onset  . Cancer Other   . Heart attack Other   . Lung disease Other           Current Outpatient Prescriptions on File Prior to Visit  Medication Sig  Dispense Refill  . albuterol (PROVENTIL HFA;VENTOLIN HFA) 108 (90 BASE) MCG/ACT inhaler Inhale 2 puffs into the lungs every 6 (six) hours as needed for wheezing or shortness of breath.    Marland Kitchen aspirin 81 MG chewable tablet Chew 81 mg by mouth daily.    . budesonide-formoterol (SYMBICORT) 160-4.5 MCG/ACT inhaler Inhale 2 puffs into the lungs 2 (two) times daily.    . Ca Carbonate-Mag Hydroxide (ROLAIDS PO) Take 1 tablet by mouth 3 (three) times daily as needed (for heartburn/indigestion.).     Marland Kitchen cetirizine (ZYRTEC) 10 MG tablet Take 10 mg by mouth daily.    Marland Kitchen dexlansoprazole (DEXILANT) 60 MG capsule Take 1 capsule (60 mg total) by mouth daily before breakfast. 30 capsule 11  . dicyclomine (BENTYL) 10 MG capsule TAKE 1 CAPSULE BY MOUTH FOUR TIMES DAILY BEFORE MEALS AND AT BEDTIME. HOLD IF CONSTIPATED 120 capsule 3  . DULoxetine (CYMBALTA) 60 MG capsule Take 60 mg by mouth daily.    . fluticasone (FLONASE) 50 MCG/ACT nasal spray Place 2 sprays into both nostrils daily.    Marland Kitchen gabapentin (NEURONTIN) 800 MG tablet Take 800 mg by mouth 4 (four) times daily.  2  . ibuprofen (ADVIL,MOTRIN) 800 MG tablet Take 800 mg by mouth every 8 (eight) hours as needed (for pain.).     Marland Kitchen levothyroxine (SYNTHROID, LEVOTHROID) 100 MCG tablet  Take 100 mcg by mouth daily before breakfast. Reported on 02/03/2016    . meclizine (ANTIVERT) 12.5 MG tablet Take 12.5 mg by mouth 2 (two) times daily as needed for dizziness.     . meloxicam (MOBIC) 15 MG tablet Take 15 mg by mouth daily. Reported on 02/03/2016    . methocarbamol (ROBAXIN) 500 MG tablet Take 500 mg by mouth 4 (four) times daily as needed for muscle spasms.    . nortriptyline (PAMELOR) 25 MG capsule Take 25 mg by mouth at bedtime.    . polyethylene glycol-electrolytes (TRILYTE) 420 g solution Take 4,000 mLs by mouth as directed. 4000 mL 0  . potassium chloride (K-DUR) 10 MEQ tablet Take 2 tablets (20 mEq total) by mouth 2 (two) times daily. For 3  days 12 tablet 0  . pravastatin (PRAVACHOL) 40 MG tablet Take 40 mg by mouth every evening.    . Tapentadol HCl (NUCYNTA) 100 MG TABS Take 100 mg by mouth 2 (two) times daily.    Marland Kitchen tiotropium (SPIRIVA) 18 MCG inhalation capsule Place 1 capsule (18 mcg total) into inhaler and inhale daily. 30 capsule 6  . TRINTELLIX 10 MG TABS Take 10 mg by mouth daily.  5  . zolpidem (AMBIEN) 5 MG tablet Take 10 mg by mouth at bedtime as needed for sleep.      No current facility-administered medications on file prior to visit.          Allergies  Allergen Reactions  . Penicillins Anaphylaxis    Has patient had a PCN reaction causing immediate rash, facial/tongue/throat swelling, SOB or lightheadedness with hypotension:Yes Has patient had a PCN reaction causing severe rash involving mucus membranes or skin necrosis:Yes Has patient had a PCN reaction that required hospitalization:No Has patient had a PCN reaction occurring within the last 10 years:No If all of the above answers are "NO", then may proceed with Cephalosporin use.        History  Alcohol Use No         History  Smoking Status  . Current Every Day Smoker  . Packs/day: 1.00  . Years: 35.00  . Types: Cigarettes  Smokeless Tobacco  . Never Used    Comment: one pack daily    Review of Systems  Constitutional: Positive for malaise/fatigue.  HENT: Positive for sinus pain.   Eyes: Negative.   Respiratory: Positive for cough, shortness of breath and wheezing.   Cardiovascular: Negative.   Gastrointestinal: Positive for heartburn.  Genitourinary: Negative.   Musculoskeletal: Positive for back pain, joint pain and neck pain.  Skin: Negative.   Neurological: Positive for dizziness and headaches.  Endo/Heme/Allergies: Negative.   Psychiatric/Behavioral: Negative.     Objective      Vitals:   12/02/16 0957  BP: (!) 141/80  Pulse: 91  Resp: 18  Temp: 97.8 F (36.6 C)    Physical Exam   Constitutional: She is oriented to person, place, and time and well-developed, well-nourished, and in no distress.  HENT:  Head: Normocephalic and atraumatic.  Neck: Normal range of motion. Neck supple.  Cardiovascular: Normal rate, regular rhythm and normal heart sounds.   No murmur heard. Pulmonary/Chest: Effort normal. She has wheezes. She has no rales.  Genitourinary:  Genitourinary Comments: Right breast is smaller than the left breast. A surgical scars present in the upper, inner quadrant of the right breast. Thickened skin with increased soft tissue density is noted in this region. It is indistinct in nature. No nipple discharge is noted.  Right axilla is negative for palpable nodes. Left breast reveals no dominant mass, nipple discharge, dimpling. Axilla is negative for palpable nodes.  Lymphadenopathy:    She has no cervical adenopathy.  Neurological: She is alert and oriented to person, place, and time.  Skin: Skin is warm and dry.  Vitals reviewed.  mammogram, ultrasound, and biopsy pathology results reviewed.  Assessment  Right breast mass, history of right breast malignancy Plan   Even though the core biopsy was negative, patient needs a right breast biopsy including skin to rule out recurrent right breast cancer. Patient understands and agrees. The patient scheduled for right breast biopsy on 12/13/2016. The risks and benefits of the procedure including bleeding and infection were fully explained to the patient, who gave informed consent.

## 2016-12-02 NOTE — Patient Instructions (Signed)
Breast Biopsy A breast biopsy is a procedure in which a sample of suspicious breast tissue is removed from your breast. Following the procedure, the tissue or liquid that is removed from the breast is examined under a microscope to see if cancerous cells are present. You may need a breast biopsy if you have:  Any undiagnosed breast mass (tumor).  Nipple abnormalities, dimpling, crusting, or ulcerations.  Abnormal discharge from the nipple, especially blood.  Redness, swelling, and pain of the breast.  Calcium deposits (calcifications) or abnormalities seen on a mammogram, ultrasound results, or MRI results.  Suspicious changes in the breast seen on your mammogram. If the breast abnormality is found to be cancerous (malignant), a breast biopsy can help to determine what the best treatment is for you. There are many different types of breast biopsies. Talk with your health care provider about your options and which type is best for you. Tell a health care provider about:  Any allergies you have.  All medicines you are taking, including vitamins, herbs, eye drops, creams, and over-the-counter medicines.  Any problems you or family members have had with anesthetic medicines.  Any blood disorders you have.  Any surgeries you have had.  Any medical conditions you have.  Whether you are pregnant or may be pregnant. What are the risks? Generally, this is a safe procedure. However, problems may occur, including:  Bleeding.  Infection.  Discomfort. This is temporary.  Allergic reactions to medicines.  Bruising and swelling of the breast.  Alteration in the shape of the breast.  Damage to other tissues.  Not finding the lump or abnormality.  Needing more surgery. What happens before the procedure?  Plan to have someone take you home after the procedure.  Do not use any tobacco products, such as cigarettes, chewing tobacco, and e-cigarettes. If you need help quitting, ask  your health care provider.  Do not drink alcohol for 24 hours before the procedure.  Ask your health care provider about:  Changing or stopping your regular medicines. This is especially important if you are taking diabetes medicines or blood thinners.  Taking medicines such as aspirin and ibuprofen. These medicines can thin your blood. Do not take these medicines before your procedure if your health care provider instructs you not to.  Wear a good support bra to the procedure.  Ask your health care provider how your surgical site will be marked or identified.  You may be given antibiotic medicine to help prevent infection.  Your health care provider may perform a procedure to place a wire (needle localization) or a seed that gives off radiation (radioactive seed localization) in the breast lump. A mammogram, ultrasound, MRI, or a combination of these techniques will be done during this procedure to identify the location of the breast abnormality. The imaging technique used will depend on the type of biopsy you are having. The wire or seed will help the health care provider locate the lump when performing the biopsy, especially if the lump cannot be felt. What happens during the procedure? You may be given one or both of the following:  A medicine to numb the breast area (local anesthetic).  A medicine to help you relax (sedative) during the procedure. The following are the different types of biopsies that can be performed. Fine-Needle Aspiration  A thin needle will be attached to a syringe and inserted into a breast cyst. Fluid and cells will be removed. This technique is not as common as a core needle  biopsy. Core Needle Biopsy  A wide, hollow needle (core needle) will be inserted into a breast lump multiple times to remove tissue samples or cores. Stereotactic Biopsy  You will lie face-down on a table. Your breast will pass through an opening in the table and will be gently compressed  into a fixed position. X-ray equipment and a computer will be used to locate the breast lump. The surgeon will use this information to collect several samples of tissue using a needle collection device. Vacuum-Assisted Biopsy  A small incision (less than  inch) will be made in your breast. A biopsy device that includes a hollow needle and vacuum will be passed through the incision and into the breast tissue. The vacuum will gently draw abnormal breast tissue into the needle to remove it. No stitches (sutures) will be needed. The incision will be covered with a bandage (dressing). In this type of biopsy, a larger tissue sample is removed than in a regular core needle biopsy. Ultrasound-Guided Core Needle Biopsy  A high-frequency ultrasound will be used to help guide the core needle to the area of the mass or abnormality. An incision will be made to insert the needle. Then tissue samples will be removed. Surgical Biopsy  This method requires an incision in the breast to remove part or all of the suspicious tissue. After the tissue is removed, the skin over the area will be closed with sutures and covered with a dressing. There are two types of surgical biopsies:  Incisional biopsy. The surgeon will remove part of the breast lump.  Excisional biopsy. The surgeon will attempt to remove the whole breast lump or as much of it as possible. After any of these procedures, the tissue or liquid that was removed will be examined under a microscope. What happens after the procedure?  You will be taken to the recovery area. If you are doing well and have no problems, you will be allowed to go home.  You may notice bruising on your breast. This is normal.  You may have a pressure dressing applied on your breast for 24-48 hours. A pressure dressing is a bandage that is wrapped tightly around the chest to stop fluid from collecting underneath tissues. You may also be advised to wear a supportive bra during this  time.  Do not drive for 24 hours if you received a sedative. This information is not intended to replace advice given to you by your health care provider. Make sure you discuss any questions you have with your health care provider. Document Released: 08/09/2005 Document Revised: 12/18/2015 Document Reviewed: 05/13/2015 Elsevier Interactive Patient Education  2017 [employer].

## 2016-12-07 NOTE — Patient Instructions (Signed)
Your procedure is scheduled on: 12/13/2016  Report to [facility] at   6:15  AM.  Call this number if you have problems the morning of surgery: [phone]   Remember:   Do not drink or eat food:After Midnight.  :  Take these medicines the morning of surgery with A SIP OF WATER: Zyrtec, Dexilant, Cymbalta, Symthroid and Spiriva   Do not wear jewelry, make-up or nail polish.  Do not wear lotions, powders, or perfumes. You may wear deodorant.  Do not shave 48 hours prior to surgery. Men may shave face and neck.  Do not bring valuables to the hospital.  Contacts, dentures or bridgework may not be worn into surgery.  Leave suitcase in the car. After surgery it may be brought to your room.  For patients admitted to the hospital, checkout time is 11:00 AM the day of discharge.   Patients discharged the day of surgery will not be allowed to drive home.    Special Instructions: Shower using CHG night before surgery and shower the day of surgery use CHG.  Use special wash - you have one bottle of CHG for all showers.  You should use approximately 1/2 of the bottle for each shower.  Breast Biopsy, Care After These instructions give you information about caring for yourself after your procedure. Your doctor may also give you more specific instructions. Call your doctor if you have any problems or questions after your procedure. Follow these instructions at home: Medicines   Take over-the-counter and prescription medicines only as told by your doctor.  Do not drive for 24 hours if you received a sedative.  Do not drink alcohol while taking pain medicine.  Do not drive or use heavy machinery while taking prescription pain medicine. Biopsy Site Care    Follow instructions from your doctor about how to take care of your cut from surgery (incision) or puncture area. Make sure you:  Wash your hands with soap and water before you change your bandage. If you cannot use soap and water, use hand  sanitizer.  Change any bandages (dressings) as told by your doctor.  Leave any stitches (sutures), skin glue, or skin tape (adhesive) strips in place. They may need to stay in place for 2 weeks or longer. If tape strips get loose and curl up, you may trim the loose edges. Do not remove tape strips completely unless your doctor says it is okay.  If you have stitches, keep them dry when you take a bath or a shower.  Check your cut or puncture area every day for signs of infection. Check for:  More redness, swelling, or pain.  More fluid or blood.  Warmth.  Pus or a bad smell.  Protect the biopsy area. Do not let the area get bumped. Activity   Avoid activities that could pull the biopsy site open.  Avoid stretching.  Avoid reaching.  Avoid exercise.  Avoid sports.  Avoid lifting anything that is heavier than 3 pounds (1.4 kg).  Return to your normal activities as told by your doctor. Ask your doctor what activities are safe for you. General instructions   Continue your normal diet.  Wear a good support bra for as long as told by your doctor.  Get checked for extra fluid in your body (lymphedema) as often as told by your doctor.  Keep all follow-up visits as told by your doctor. This is important. Contact a health care provider if:  You have more redness, swelling,  or pain at the biopsy site.  You have more fluid or blood coming from your biopsy site.  Your biopsy site feels warm to the touch.  You have pus or a bad smell coming from the biopsy site.  Your biopsy site breaks open after the stitches, staples, or skin tape strips have been removed.  You have a rash.  You have a fever. Get help right away if:  You have more bleeding (more than a small spot) from the biopsy site.  You have trouble breathing.  You have red streaks around the biopsy site. This information is not intended to replace advice given to you by your health care provider. Make sure you  discuss any questions you have with your health care provider. Document Released: 06/05/2009 Document Revised: 04/15/2016 Document Reviewed: 05/13/2015 Elsevier Interactive Patient Education  2017 [place] Anesthesia, Adult, Care After These instructions provide you with information about caring for yourself after your procedure. Your health care provider may also give you more specific instructions. Your treatment has been planned according to current medical practices, but problems sometimes occur. Call your health care provider if you have any problems or questions after your procedure. What can I expect after the procedure? After the procedure, it is common to have:  Vomiting.  A sore throat.  Mental slowness. It is common to feel:  Nauseous.  Cold or shivery.  Sleepy.  Tired.  Sore or achy, even in parts of your body where you did not have surgery. Follow these instructions at home: For at least 24 hours after the procedure:   Do not:  Participate in activities where you could fall or become injured.  Drive.  Use heavy machinery.  Drink alcohol.  Take sleeping pills or medicines that cause drowsiness.  Make important decisions or sign legal documents.  Take care of children on your own.  Rest. Eating and drinking   If you vomit, drink water, juice, or soup when you can drink without vomiting.  Drink enough fluid to keep your urine clear or pale yellow.  Make sure you have little or no nausea before eating solid foods.  Follow the diet recommended by your health care provider. General instructions   Have a responsible adult stay with you until you are awake and alert.  Return to your normal activities as told by your health care provider. Ask your health care provider what activities are safe for you.  Take over-the-counter and prescription medicines only as told by your health care provider.  If you smoke, do not smoke without  supervision.  Keep all follow-up visits as told by your health care provider. This is important. Contact a health care provider if:  You continue to have nausea or vomiting at home, and medicines are not helpful.  You cannot drink fluids or start eating again.  You cannot urinate after 8-12 hours.  You develop a skin rash.  You have fever.  You have increasing redness at the site of your procedure. Get help right away if:  You have difficulty breathing.  You have chest pain.  You have unexpected bleeding.  You feel that you are having a life-threatening or urgent problem. This information is not intended to replace advice given to you by your health care provider. Make sure you discuss any questions you have with your health care provider. Document Released: 11/15/2000 Document Revised: 01/12/2016 Document Reviewed: 07/24/2015 Elsevier Interactive Patient Education  2017 [employer].

## 2016-12-08 ENCOUNTER — Ambulatory Visit (HOSPITAL_COMMUNITY)
Admission: RE | Admit: 2016-12-08 | Discharge: 2016-12-08 | Disposition: A | Discharge status: Home / Self Care | Payer: Medicaid Other | Source: Ambulatory Visit | Attending: General Surgery | Admitting: General Surgery

## 2016-12-08 ENCOUNTER — Encounter (HOSPITAL_COMMUNITY): Payer: Self-pay

## 2016-12-08 ENCOUNTER — Encounter (HOSPITAL_COMMUNITY)
Admission: RE | Admit: 2016-12-08 | Discharge: 2016-12-08 | Disposition: A | Discharge status: Home / Self Care | Payer: Medicaid Other | Source: Ambulatory Visit | Attending: General Surgery | Admitting: General Surgery

## 2016-12-08 DIAGNOSIS — Z01812 Encounter for preprocedural laboratory examination: Secondary | ICD-10-CM | POA: Diagnosis present

## 2016-12-08 DIAGNOSIS — Z01818 Encounter for other preprocedural examination: Secondary | ICD-10-CM | POA: Insufficient documentation

## 2016-12-08 DIAGNOSIS — N6310 Unspecified lump in the right breast, unspecified quadrant: Secondary | ICD-10-CM | POA: Insufficient documentation

## 2016-12-08 LAB — BASIC METABOLIC PANEL
Anion gap: 11 (ref 5–15)
BUN: 8 mg/dL (ref 6–20)
CALCIUM: 8.8 mg/dL — AB (ref 8.9–10.3)
CHLORIDE: 95 mmol/L — AB (ref 101–111)
CO2: 29 mmol/L (ref 22–32)
CREATININE: 0.7 mg/dL (ref 0.44–1.00)
GFR calc non Af Amer: >60 mL/min (ref >60)
Glucose, Bld: 83 mg/dL (ref 65–99)
Potassium: 3.5 mmol/L (ref 3.5–5.1)
SODIUM: 135 mmol/L (ref 135–145)

## 2016-12-13 ENCOUNTER — Encounter (HOSPITAL_COMMUNITY): Admission: RE | Payer: Self-pay | Source: Ambulatory Visit

## 2016-12-13 ENCOUNTER — Ambulatory Visit (HOSPITAL_COMMUNITY): Admission: RE | Admit: 2016-12-13 | Payer: Medicaid Other | Source: Ambulatory Visit | Admitting: General Surgery

## 2016-12-13 SURGERY — BREAST BIOPSY
Anesthesia: General | Laterality: Right

## 2016-12-14 ENCOUNTER — Ambulatory Visit: Payer: Medicaid Other | Admitting: Gastroenterology

## 2016-12-21 NOTE — Patient Instructions (Signed)
ASYRIA KOLANDER  12/21/2016     '@[email or web address]'$ @   Your procedure is scheduled on  12/29/2016   Report to [facility] at  700  A.M.  Call this number if you have problems the morning of surgery:  [phone]   Remember:  Do not eat food or drink liquids after midnight.  Take these medicines the morning of surgery with A SIP OF WATER  dexilant, cymbalta, neurontin, synthroid, antivert, mobic, robaxin, tapentadol (if needed), trintellix. Take your inhalers before you come.   Do not wear jewelry, make-up or nail polish.  Do not wear lotions, powders, or perfumes, or deoderant.  Do not shave 48 hours prior to surgery.  Men may shave face and neck.  Do not bring valuables to the hospital.  [facility] is not responsible for any belongings or valuables.  Contacts, dentures or bridgework may not be worn into surgery.  Leave your suitcase in the car.  After surgery it may be brought to your room.  For patients admitted to the hospital, discharge time will be determined by your treatment team.  Patients discharged the day of surgery will not be allowed to drive home.   Name and phone number of your driver:   family Special instructions:   None  Please read over the following fact sheets that you were given. Anesthesia Post-op Instructions and Care and Recovery After Surgery       Breast Biopsy A breast biopsy is a procedure in which a sample of suspicious breast tissue is removed from your breast. Following the procedure, the tissue or liquid that is removed from the breast is examined under a microscope to see if cancerous cells are present. You may need a breast biopsy if you have:  Any undiagnosed breast mass (tumor).  Nipple abnormalities, dimpling, crusting, or ulcerations.  Abnormal discharge from the nipple, especially blood.  Redness, swelling, and pain of the breast.  Calcium deposits (calcifications) or abnormalities seen on a mammogram, ultrasound  results, or MRI results.  Suspicious changes in the breast seen on your mammogram. If the breast abnormality is found to be cancerous (malignant), a breast biopsy can help to determine what the best treatment is for you. There are many different types of breast biopsies. Talk with your health care provider about your options and which type is best for you. Tell a health care provider about:  Any allergies you have.  All medicines you are taking, including vitamins, herbs, eye drops, creams, and over-the-counter medicines.  Any problems you or family members have had with anesthetic medicines.  Any blood disorders you have.  Any surgeries you have had.  Any medical conditions you have.  Whether you are pregnant or may be pregnant. What are the risks? Generally, this is a safe procedure. However, problems may occur, including:  Bleeding.  Infection.  Discomfort. This is temporary.  Allergic reactions to medicines.  Bruising and swelling of the breast.  Alteration in the shape of the breast.  Damage to other tissues.  Not finding the lump or abnormality.  Needing more surgery. What happens before the procedure?  Plan to have someone take you home after the procedure.  Do not use any tobacco products, such as cigarettes, chewing tobacco, and e-cigarettes. If you need help quitting, ask your health care provider.  Do not drink alcohol for 24 hours before the procedure.  Ask your health care provider about:  Changing or stopping your regular  medicines. This is especially important if you are taking diabetes medicines or blood thinners.  Taking medicines such as aspirin and ibuprofen. These medicines can thin your blood. Do not take these medicines before your procedure if your health care provider instructs you not to.  Wear a good support bra to the procedure.  Ask your health care provider how your surgical site will be marked or identified.  You may be given  antibiotic medicine to help prevent infection.  Your health care provider may perform a procedure to place a wire (needle localization) or a seed that gives off radiation (radioactive seed localization) in the breast lump. A mammogram, ultrasound, MRI, or a combination of these techniques will be done during this procedure to identify the location of the breast abnormality. The imaging technique used will depend on the type of biopsy you are having. The wire or seed will help the health care provider locate the lump when performing the biopsy, especially if the lump cannot be felt. What happens during the procedure? You may be given one or both of the following:  A medicine to numb the breast area (local anesthetic).  A medicine to help you relax (sedative) during the procedure. The following are the different types of biopsies that can be performed. Fine-Needle Aspiration  A thin needle will be attached to a syringe and inserted into a breast cyst. Fluid and cells will be removed. This technique is not as common as a core needle biopsy. Core Needle Biopsy  A wide, hollow needle (core needle) will be inserted into a breast lump multiple times to remove tissue samples or cores. Stereotactic Biopsy  You will lie face-down on a table. Your breast will pass through an opening in the table and will be gently compressed into a fixed position. X-ray equipment and a computer will be used to locate the breast lump. The surgeon will use this information to collect several samples of tissue using a needle collection device. Vacuum-Assisted Biopsy  A small incision (less than  inch) will be made in your breast. A biopsy device that includes a hollow needle and vacuum will be passed through the incision and into the breast tissue. The vacuum will gently draw abnormal breast tissue into the needle to remove it. No stitches (sutures) will be needed. The incision will be covered with a bandage (dressing). In this  type of biopsy, a larger tissue sample is removed than in a regular core needle biopsy. Ultrasound-Guided Core Needle Biopsy  A high-frequency ultrasound will be used to help guide the core needle to the area of the mass or abnormality. An incision will be made to insert the needle. Then tissue samples will be removed. Surgical Biopsy  This method requires an incision in the breast to remove part or all of the suspicious tissue. After the tissue is removed, the skin over the area will be closed with sutures and covered with a dressing. There are two types of surgical biopsies:  Incisional biopsy. The surgeon will remove part of the breast lump.  Excisional biopsy. The surgeon will attempt to remove the whole breast lump or as much of it as possible. After any of these procedures, the tissue or liquid that was removed will be examined under a microscope. What happens after the procedure?  You will be taken to the recovery area. If you are doing well and have no problems, you will be allowed to go home.  You may notice bruising on your breast. This  is normal.  You may have a pressure dressing applied on your breast for 24-48 hours. A pressure dressing is a bandage that is wrapped tightly around the chest to stop fluid from collecting underneath tissues. You may also be advised to wear a supportive bra during this time.  Do not drive for 24 hours if you received a sedative. This information is not intended to replace advice given to you by your health care provider. Make sure you discuss any questions you have with your health care provider. Document Released: 08/09/2005 Document Revised: 12/18/2015 Document Reviewed: 05/13/2015 Elsevier Interactive Patient Education  2017 [place].  Breast Biopsy, Care After These instructions give you information about caring for yourself after your procedure. Your doctor may also give you more specific instructions. Call your doctor if you have any  problems or questions after your procedure. Follow these instructions at home: Medicines   Take over-the-counter and prescription medicines only as told by your doctor.  Do not drive for 24 hours if you received a sedative.  Do not drink alcohol while taking pain medicine.  Do not drive or use heavy machinery while taking prescription pain medicine. Biopsy Site Care    Follow instructions from your doctor about how to take care of your cut from surgery (incision) or puncture area. Make sure you:  Wash your hands with soap and water before you change your bandage. If you cannot use soap and water, use hand sanitizer.  Change any bandages (dressings) as told by your doctor.  Leave any stitches (sutures), skin glue, or skin tape (adhesive) strips in place. They may need to stay in place for 2 weeks or longer. If tape strips get loose and curl up, you may trim the loose edges. Do not remove tape strips completely unless your doctor says it is okay.  If you have stitches, keep them dry when you take a bath or a shower.  Check your cut or puncture area every day for signs of infection. Check for:  More redness, swelling, or pain.  More fluid or blood.  Warmth.  Pus or a bad smell.  Protect the biopsy area. Do not let the area get bumped. Activity   Avoid activities that could pull the biopsy site open.  Avoid stretching.  Avoid reaching.  Avoid exercise.  Avoid sports.  Avoid lifting anything that is heavier than 3 pounds (1.4 kg).  Return to your normal activities as told by your doctor. Ask your doctor what activities are safe for you. General instructions   Continue your normal diet.  Wear a good support bra for as long as told by your doctor.  Get checked for extra fluid in your body (lymphedema) as often as told by your doctor.  Keep all follow-up visits as told by your doctor. This is important. Contact a health care provider if:  You have more redness,  swelling, or pain at the biopsy site.  You have more fluid or blood coming from your biopsy site.  Your biopsy site feels warm to the touch.  You have pus or a bad smell coming from the biopsy site.  Your biopsy site breaks open after the stitches, staples, or skin tape strips have been removed.  You have a rash.  You have a fever. Get help right away if:  You have more bleeding (more than a small spot) from the biopsy site.  You have trouble breathing.  You have red streaks around the biopsy site. This information is  not intended to replace advice given to you by your health care provider. Make sure you discuss any questions you have with your health care provider. Document Released: 06/05/2009 Document Revised: 04/15/2016 Document Reviewed: 05/13/2015 Elsevier Interactive Patient Education  2017 [employer] PATIENT INSTRUCTIONS POST-ANESTHESIA  IMMEDIATELY FOLLOWING SURGERY:  Do not drive or operate machinery for the first twenty four hours after surgery.  Do not make any important decisions for twenty four hours after surgery or while taking narcotic pain medications or sedatives.  If you develop intractable nausea and vomiting or a severe headache please notify your doctor immediately.  FOLLOW-UP:  Please make an appointment with your surgeon as instructed. You do not need to follow up with anesthesia unless specifically instructed to do so.  WOUND CARE INSTRUCTIONS (if applicable):  Keep a dry clean dressing on the anesthesia/puncture wound site if there is drainage.  Once the wound has quit draining you may leave it open to air.  Generally you should leave the bandage intact for twenty four hours unless there is drainage.  If the epidural site drains for more than 36-48 hours please call the anesthesia department.  QUESTIONS?:  Please feel free to call your physician or the hospital operator if you have any questions, and they will be happy to assist you.

## 2016-12-24 ENCOUNTER — Encounter (HOSPITAL_COMMUNITY)
Admission: RE | Admit: 2016-12-24 | Discharge: 2016-12-24 | Disposition: A | Discharge status: Home / Self Care | Payer: Medicaid Other | Source: Ambulatory Visit | Attending: General Surgery | Admitting: General Surgery

## 2016-12-24 ENCOUNTER — Encounter (HOSPITAL_COMMUNITY): Payer: Self-pay

## 2016-12-27 ENCOUNTER — Ambulatory Visit (INDEPENDENT_AMBULATORY_CARE_PROVIDER_SITE_OTHER): Payer: Medicaid Other

## 2016-12-27 ENCOUNTER — Encounter (INDEPENDENT_AMBULATORY_CARE_PROVIDER_SITE_OTHER): Payer: Self-pay | Admitting: Specialist

## 2016-12-27 ENCOUNTER — Ambulatory Visit (INDEPENDENT_AMBULATORY_CARE_PROVIDER_SITE_OTHER): Payer: Medicaid Other | Admitting: Specialist

## 2016-12-27 VITALS — BP 130/79 | HR 84 | Ht 60.0 in | Wt 172.0 lb

## 2016-12-27 DIAGNOSIS — M5416 Radiculopathy, lumbar region: Secondary | ICD-10-CM

## 2016-12-27 DIAGNOSIS — M4316 Spondylolisthesis, lumbar region: Secondary | ICD-10-CM

## 2016-12-27 DIAGNOSIS — M545 Low back pain: Secondary | ICD-10-CM | POA: Diagnosis not present

## 2016-12-27 DIAGNOSIS — M48062 Spinal stenosis, lumbar region with neurogenic claudication: Secondary | ICD-10-CM | POA: Diagnosis not present

## 2016-12-27 MED ORDER — ALPRAZOLAM 0.5 MG PO TABS: ORAL_TABLET | ORAL | 0 refills | Status: DC

## 2016-12-27 NOTE — Progress Notes (Addendum)
Office Visit Note   Patient: Sydney Blair           Date of Birth: 10/08/1955           MRN: [id number] Visit Date: 12/27/2016              Requested by: [name], MD [id number] [place], [place] [address] PCP: [name], MD   Assessment & Plan: Visit Diagnoses:  1. Low back pain, unspecified back pain laterality, unspecified chronicity, with sciatica presence unspecified   2. Spinal stenosis of lumbar region with neurogenic claudication   3. Spondylolisthesis, lumbar region   4. Radiculopathy, lumbar region    61 year old female appears older than age, increasing difficulty with standing and walking. Anesthesia of the buttocks and into her feet. Her spondylolisthesis L5-S1 is worsening and there is evidence of motion across the L5-S1 disc space. Most of the lumbar spine is well maintained With relatively normal disc heights L2-3, L3-4 and L4-5. Last MRI was in 2014. Requesting a new MRI to assess for nerve compression.   Plan: Avoid bending, stooping and avoid lifting weights greater than 10 lbs. Avoid prolong standing and walking. Order for a new walker with wheels. MRI of the lumbar spine is scheduled to evaluate for worsening narrowing of the spinal canal or L5 neuroforamen due to increasing slip of L5 on S1.      Follow-Up Instructions: Return in about 3 weeks (around 01/17/2017).   Orders:  Orders Placed This Encounter  Procedures  . XR Lumbar Spine 2-3 Views  . MR Lumbar Spine w/o contrast   Meds ordered this encounter  Medications  . ALPRAZolam (XANAX) 0.5 MG tablet    Sig: Take one tablet po at the MRI scanner.    Dispense:  1 tablet    Refill:  0      Procedures: No procedures performed   Clinical Data: No additional findings.   Subjective: Chief Complaint  Patient presents with  . Lower Back - Follow-up    61 year old female with history of neuropathy and fibromyalgia, complains of increasing difficulties walking, even from  car to inside the house with the legs hurting and feeling tired. Leans on carts at the grocery store. Prefers sitting and stooping to walking or standing upright. Complains of numbness in the buttocks and down the legs and into the feet. 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 No diabetes. Having night pain in the buttocks and legs and feet. Takes gabapentin and nucenyta 100  Mg bid and gabapentin 100 mg QID. Ibuprofen also for pain.    Review of Systems  Constitutional: Negative.   HENT: Negative.   Eyes: Negative.   Respiratory: Negative.   Cardiovascular: Negative.   Gastrointestinal: Negative.   Endocrine: Negative.   Genitourinary: Negative.   Musculoskeletal: Negative.   Skin: Negative.   Allergic/Immunologic: Negative.   Neurological: Negative.   Hematological: Negative.   Psychiatric/Behavioral: Negative.      Objective: Vital Signs: BP 130/79 (BP Location: Left Arm, Patient Position: Sitting)   Pulse 84   Ht 5' (1.524 m)   Wt 172 lb (78 kg)   BMI 33.59 kg/m   Physical Exam  Constitutional: She is oriented to person, place, and time. She appears well-developed and well-nourished.  HENT:  Head: Normocephalic and atraumatic.  Eyes: EOM are normal. Pupils are equal, round, and reactive to light.  Neck: Normal range of motion. Neck supple.  Pulmonary/Chest: Effort normal and breath sounds normal.  Abdominal: Soft. Bowel sounds are normal.  Neurological: She is alert and oriented to person, place, and time.  Skin: Skin is warm and dry.  Psychiatric: She has a normal mood and affect. Her behavior is normal. Judgment and thought content normal.    Back Exam   Tenderness  The patient is experiencing tenderness in the lumbar.  Range of Motion  Extension: abnormal  Flexion: normal  Lateral Bend Right: normal  Lateral Bend Left: normal  Rotation Right: normal  Rotation Left: normal   Muscle Strength  Right  Quadriceps:  5/5  Left Quadriceps:  5/5  Right Hamstrings:  5/5  Left Hamstrings:  5/5   Tests  Straight leg raise right: negative Straight leg raise left: negative  Reflexes  Patellar: normal Achilles: abnormal Biceps: normal Babinski's sign: normal   Other  Toe Walk: normal Heel Walk: abnormal Sensation: decreased Gait: abnormal  Erythema: no back redness Scars: absent      Specialty Comments:  No specialty comments available.  Imaging: Xr Lumbar Spine 2-3 Views  Result Date: 12/27/2016 AP and lateral flexion and extension radiographs of the lumbar spine show the Spondylolisthesis at the L5-S1 level is nearly grade 3, the disc space is narrowed but more apparent on extension of the lumbar spine. Remaining discs L2 to L5 are more normal appearance. There are degenerative disc changes at the thoracolumbar level.    PMFS History: Patient Active Problem List   Diagnosis Date Noted  . Fracture of fifth metatarsal bone of right foot with delayed healing 02/03/2016  . IBS (irritable bowel syndrome) 07/30/2015  . Diarrhea 10/29/2014  . Hoarseness 10/29/2014  . Chronic hoarseness 01/16/2014  . Other dysphagia 01/16/2014  . Dysphagia, unspecified(787.20) 06/04/2013  . Unspecified constipation 06/04/2013  . Unsteady gait 01/25/2013  . COPD (chronic obstructive pulmonary disease) with emphysema ([facility]) 12/26/2012  . DOE (dyspnea on exertion) 12/01/2012  . 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 Dysphagia 11/09/2012  . COPD exacerbation ([facility]) 08/07/2011  . Hypokalemia 08/07/2011  . Myositis 08/07/2011  . Dehydration 08/07/2011  . ARF (acute renal failure) ([facility]) 08/07/2011  . Tubular adenoma 07/20/2011  . INSOMNIA 02/26/2008  . CERUMEN IMPACTION, RIGHT 01/23/2008  . 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 NECK PAIN 11/22/2007  . HAMMER TOE 11/22/2007  . ANXIETY 01/30/2007  . TOBACCO ABUSE 01/30/2007  . DEPRESSION  01/30/2007  . GERD 01/30/2007  . PROLAPSE, VAGINAL WALL, CYSTOCELE, MIDLINE 01/10/2007  . INCONTINENCE 01/10/2007  . SEBACEOUS CYST 08/17/2006  . ADENOCARCINOMA, BREAST, RIGHT 12/01/2003   Past Medical History:  Diagnosis Date  . Anxiety   . ARF (acute renal failure) ([facility]) 08/07/2011  . Cancer ([facility]) 2005   breast-right  . Chronic back pain   . COPD (chronic obstructive pulmonary disease) ([facility])   . Depression   . Depression with anxiety   . Fibromyalgia   . GERD (gastroesophageal reflux disease)   . Hypercholesteremia   . Hypothyroidism   . Incontinence   . Neuropathy   . Peripheral neuropathy   . Sciatic pain   . Sebaceous cyst    lt labial location  . Sleep apnea    uses CPAP  . Tobacco abuse     Family History  Problem Relation Age of Onset  . Cancer Other   . Heart attack Other   . Lung disease Other     Past Surgical History:  Procedure Laterality Date  . bladder tack    .  BREAST LUMPECTOMY     right with node dissection  . COLONOSCOPY  11/11/2010   OXB:[id number] rectal polyp and splenic flexure otherwise normal; pathology showed  tubular adenomas. Next TCS 10/2015.  Marland Kitchen ESOPHAGOGASTRODUODENOSCOPY  11/11/2010   STM:HDQQI hiatal hernia, 83 French dilator  . ESOPHAGOGASTRODUODENOSCOPY (EGD) WITH ESOPHAGEAL DILATION N/A 06/21/2013   WLN:LGXQJJ esophagus  - status post Maloney dilation s/p bx (benign esophageal bx)   Social History   Occupational History  . disabled Unemployed   Social History Main Topics  . Smoking status: Current Every Day Smoker    Packs/day: 1.00    Years: 35.00    Types: Cigarettes  . Smokeless tobacco: Never Used     Comment: one pack daily  . Alcohol use No  . Drug use: No  . Sexual activity: Yes    Birth control/ protection: Post-menopausal     Comment: chemo stopped her periods

## 2016-12-27 NOTE — Patient Instructions (Signed)
Avoid bending, stooping and avoid lifting weights greater than 10 lbs. Avoid prolong standing and walking. Order for a new walker with wheels. MRI of the lumbar spine is scheduled to evaluate for worsening narrowing of the spinal canal due to increasing slip of L5 on S1.

## 2016-12-28 ENCOUNTER — Ambulatory Visit (INDEPENDENT_AMBULATORY_CARE_PROVIDER_SITE_OTHER): Payer: Medicaid Other | Admitting: Gastroenterology

## 2016-12-28 ENCOUNTER — Other Ambulatory Visit: Payer: Self-pay

## 2016-12-28 ENCOUNTER — Encounter: Payer: Self-pay | Admitting: Gastroenterology

## 2016-12-28 VITALS — BP 132/90 | HR 89 | Temp 97.2°F | Ht 60.0 in | Wt 172.0 lb

## 2016-12-28 DIAGNOSIS — Z860100 Personal history of colon polyps, unspecified: Secondary | ICD-10-CM

## 2016-12-28 DIAGNOSIS — K219 Gastro-esophageal reflux disease without esophagitis: Secondary | ICD-10-CM

## 2016-12-28 DIAGNOSIS — R1032 Left lower quadrant pain: Secondary | ICD-10-CM

## 2016-12-28 DIAGNOSIS — R1319 Other dysphagia: Secondary | ICD-10-CM

## 2016-12-28 DIAGNOSIS — R1310 Dysphagia, unspecified: Secondary | ICD-10-CM

## 2016-12-28 MED ORDER — PEG 3350-KCL-NA BICARB-NACL 420 G PO SOLR: 4000.0000 mL | ORAL | 0 refills | Status: DC

## 2016-12-28 NOTE — Assessment & Plan Note (Signed)
Chronic GERD with recent refractory symptoms, resolved with changing PPI. However has had persistent solid food dysphagia. Previous EGD dilations have helped. Plan for EGD with dilation in the near future. Deep sedation planned due to polypharmacy.  I have discussed the risks, alternatives, benefits with regards to but not limited to the risk of reaction to medication, bleeding, infection, perforation and the patient is agreeable to proceed. Written consent to be obtained.

## 2016-12-28 NOTE — Assessment & Plan Note (Signed)
Due for surveillance colonoscopy for history of adenomatous colon polyps. Clinically doing well from a GI standpoint with the exception of mild diffuse intermittent abdominal discomfort in the setting of fibromyalgia and chronic pain. No postprandial components. No change in bowel habits. No blood in stool. Pursue colonoscopy with deep sedation in the near future as planned.  I have discussed the risks, alternatives, benefits with regards to but not limited to the risk of reaction to medication, bleeding, infection, perforation and the patient is agreeable to proceed. Written consent to be obtained.

## 2016-12-28 NOTE — Progress Notes (Signed)
cc'ed to pcp °

## 2016-12-28 NOTE — Patient Instructions (Signed)
1. Colonoscopy and upper endoscopy as scheduled. Please see separate instructions. 

## 2016-12-28 NOTE — Progress Notes (Signed)
Primary Care Physician:  [name], MD  Primary Gastroenterologist:  Garfield Cornea, MD   Chief Complaint  Patient presents with  . Abdominal Pain    lower abd, reschedule tcs/egd    HPI:  Sydney Blair is a 61 y.o. female here to reschedule procedures. She did not have her procedures done back in March due to the weather. She has a history of chronic GERD,? LPR, recurrent diarrhea. Due for surveillance colonoscopy in March 2017 for history of adenomatous colon polyps. Her heartburn had improved with switch from pantoprazole to Dexilant but secondary to ongoing dysphagia EGD with dilation was planned. Esophageal dilations have helped significantly in the past. Predominant complaint of solid food dysphagia.  Some mild abdominal pain, intermittent, "once in blue moon". Mostly lower abdomen. She believes it may be from her fibromyalgia. BM regular. No hard stools. No melena, brbpr. No unintentional weight loss. No heartburn, vomiting.  Current Outpatient Prescriptions  Medication Sig Dispense Refill  . albuterol (PROVENTIL HFA;VENTOLIN HFA) 108 (90 BASE) MCG/ACT inhaler Inhale 2 puffs into the lungs every 6 (six) hours as needed for wheezing or shortness of breath.    Marland Kitchen aspirin 81 MG chewable tablet Chew 81 mg by mouth daily.    . budesonide-formoterol (SYMBICORT) 160-4.5 MCG/ACT inhaler Inhale 2 puffs into the lungs 2 (two) times daily.    . cetirizine (ZYRTEC) 10 MG tablet Take 10 mg by mouth daily.    Marland Kitchen dexlansoprazole (DEXILANT) 60 MG capsule Take 1 capsule (60 mg total) by mouth daily before breakfast. 30 capsule 11  . diclofenac sodium (VOLTAREN) 1 % GEL Apply 4 g topically 4 (four) times daily.    . DULoxetine (CYMBALTA) 60 MG capsule Take 60 mg by mouth daily.    . fluticasone (FLONASE) 50 MCG/ACT nasal spray Place 2 sprays into both nostrils daily.    Marland Kitchen gabapentin (NEURONTIN) 800 MG tablet Take 800 mg by mouth 4 (four) times daily.  2  . ibuprofen (ADVIL,MOTRIN) 800 MG  tablet Take 800 mg by mouth every 8 (eight) hours as needed for moderate pain.     Marland Kitchen levothyroxine (SYNTHROID, LEVOTHROID) 100 MCG tablet Take 100 mcg by mouth daily before breakfast. Reported on 02/03/2016    . meclizine (ANTIVERT) 12.5 MG tablet Take 12.5 mg by mouth 2 (two) times daily as needed for dizziness.     . methocarbamol (ROBAXIN) 500 MG tablet Take 500 mg by mouth 4 (four) times daily as needed for muscle spasms.    . nortriptyline (PAMELOR) 25 MG capsule Take 25 mg by mouth at bedtime.    Marland Kitchen olopatadine (PATANOL) 0.1 % ophthalmic solution Place 1 drop into both eyes 2 (two) times daily.     Marland Kitchen OVER THE COUNTER MEDICATION Calcium '800mg'$  by mouth daily    . OVER THE COUNTER MEDICATION Potassium by mouth daily (unsure of dosage) over the counter    . pravastatin (PRAVACHOL) 40 MG tablet Take 40 mg by mouth every evening.    . Tapentadol HCl (NUCYNTA) 100 MG TABS Take 100 mg by mouth 2 (two) times daily.    Marland Kitchen tiotropium (SPIRIVA) 18 MCG inhalation capsule Place 1 capsule (18 mcg total) into inhaler and inhale daily. 30 capsule 6  . TRINTELLIX 10 MG TABS Take 10 mg by mouth daily.  5  . zolpidem (AMBIEN) 5 MG tablet Take 10 mg by mouth at bedtime as needed for sleep.     .        No current facility-administered medications  for this visit.     Allergies as of 12/28/2016 - Review Complete 12/28/2016  Allergen Reaction Noted  . Penicillins Anaphylaxis     Past Medical History:  Diagnosis Date  . Anxiety   . ARF (acute renal failure) ([facility]) 08/07/2011  . Cancer ([facility]) 2005   breast-right  . Chronic back pain   . COPD (chronic obstructive pulmonary disease) ([facility])   . Depression   . Depression with anxiety   . Fibromyalgia   . GERD (gastroesophageal reflux disease)   . Hypercholesteremia   . Hypothyroidism   . Incontinence   . Neuropathy   . Peripheral neuropathy   . Sciatic pain   . Sebaceous cyst    lt labial location  . Sleep apnea    uses CPAP  . Tobacco abuse      Past Surgical History:  Procedure Laterality Date  . bladder tack    . BREAST LUMPECTOMY     right with node dissection  . COLONOSCOPY  11/11/2010   LKG:[id number] rectal polyp and splenic flexure otherwise normal; pathology showed  tubular adenomas. Next TCS 10/2015.  Marland Kitchen ESOPHAGOGASTRODUODENOSCOPY  11/11/2010   UYQ:IHKVQ hiatal hernia, 70 French dilator  . ESOPHAGOGASTRODUODENOSCOPY (EGD) WITH ESOPHAGEAL DILATION N/A 06/21/2013   QVZ:DGLOVF esophagus  - status post Maloney dilation s/p bx (benign esophageal bx)    Family History  Problem Relation Age of Onset  . Cancer Other   . Heart attack Other   . Lung disease Other     Social History   Social History  . Marital status: Legally Separated    Spouse name: N/A  . Number of children: 1  . Years of education: N/A   Occupational History  . disabled Unemployed   Social History Main Topics  . Smoking status: Current Every Day Smoker    Packs/day: 1.00    Years: 35.00    Types: Cigarettes  . Smokeless tobacco: Never Used     Comment: one pack daily  . Alcohol use No  . Drug use: No  . Sexual activity: Yes    Birth control/ protection: Post-menopausal     Comment: chemo stopped her periods   Other Topics Concern  . Not on file   Social History Narrative   Lives w. Husband   Lost 1 son-?etiology      ROS:  General: Negative for anorexia, weight loss, fever, chills, fatigue, weakness. Eyes: Negative for vision changes.  ENT: Negative for hoarseness, difficulty swallowing , nasal congestion. CV: Negative for chest pain, angina, palpitations, dyspnea on exertion, peripheral edema.  Respiratory: Negative for dyspnea at rest, dyspnea on exertion, cough, sputum, wheezing.  GI: See history of present illness. GU:  Negative for dysuria, hematuria, urinary incontinence, urinary frequency, nocturnal urination.  MS: Chronic joint, back pain  Derm: Negative for rash or itching.  Neuro: Negative for weakness, abnormal  sensation, seizure, frequent headaches, memory loss, confusion.  Psych: Negative for anxiety, depression, suicidal ideation, hallucinations.  Endo: Negative for unusual weight change.  Heme: Negative for bruising or bleeding. Allergy: Negative for rash or hives.    Physical Examination:  BP 132/90   Pulse 89   Temp 97.2 F (36.2 C) (Oral)   Ht 5' (1.524 m)   Wt 172 lb (78 kg)   BMI 33.59 kg/m    General: Well-nourished, well-developed in no acute distress.  Head: Normocephalic, atraumatic.   Eyes: Conjunctiva pink, no icterus. Mouth: Oropharyngeal mucosa moist and pink , no lesions erythema or exudate. Neck:  Supple without thyromegaly, masses, or lymphadenopathy.  Lungs: Clear to auscultation bilaterally.  Heart: Regular rate and rhythm, no murmurs rubs or gallops.  Abdomen: Bowel sounds are normal, very mild diffuse tenderness to deep palpation, nondistended, no hepatosplenomegaly or masses, no abdominal bruits or    hernia , no rebound or guarding.   Rectal: Deferred Extremities: No lower extremity edema. No clubbing or deformities.  Neuro: Alert and oriented x 4 , grossly normal neurologically.  Skin: Warm and dry, no rash or jaundice.   Psych: Alert and cooperative, normal mood and affect.  Labs: Lab Results  Component Value Date   CREATININE 0.70 12/08/2016   BUN 8 12/08/2016   NA 135 12/08/2016   K 3.5 12/08/2016   CL 95 (L) 12/08/2016   CO2 29 12/08/2016   Lab Results  Component Value Date   WBC 9.3 10/26/2016   HGB 14.3 10/26/2016   HCT 44.9 10/26/2016   MCV 85.9 10/26/2016   PLT 245 10/26/2016     Imaging Studies: Chest 2 View  Result Date: 12/08/2016 CLINICAL DATA:  Preoperative examination prior to right breast surgery. History of right breast cancer and lumpectomy. Cough for 1 week. EXAM: CHEST  2 VIEW COMPARISON:  05/28/2015 chest radiograph and prior exams FINDINGS: The cardiomediastinal silhouette is unremarkable. A 5 cm masslike opacity within  the right upper lobe is identified. COPD type changes are noted. No pleural effusion or pneumothorax. No acute bony abnormalities are noted. IMPRESSION: 5 cm masslike opacity within the right upper lobe -chest CT with contrast is recommended for further evaluation. Electronically Signed   By: Margarette Canada M.D.   On: 12/08/2016 13:47   Xr Lumbar Spine 2-3 Views  Result Date: 12/27/2016 AP and lateral flexion and extension radiographs of the lumbar spine show the Spondylolisthesis at the L5-S1 level is nearly grade 3, the disc space is narrowed but more apparent on extension of the lumbar spine. Remaining discs L2 to L5 are more normal appearance. There are degenerative disc changes at the thoracolumbar level.

## 2016-12-29 ENCOUNTER — Encounter (HOSPITAL_COMMUNITY)
Admission: RE | Disposition: A | Discharge status: Home / Self Care | Payer: Self-pay | Source: Ambulatory Visit | Attending: General Surgery

## 2016-12-29 ENCOUNTER — Ambulatory Visit (HOSPITAL_COMMUNITY): Payer: Medicaid Other | Admitting: Anesthesiology

## 2016-12-29 ENCOUNTER — Encounter (HOSPITAL_COMMUNITY): Payer: Self-pay | Admitting: *Deleted

## 2016-12-29 ENCOUNTER — Ambulatory Visit (HOSPITAL_COMMUNITY)
Admission: RE | Admit: 2016-12-29 | Discharge: 2016-12-29 | Disposition: A | Discharge status: Home / Self Care | Payer: Medicaid Other | Source: Ambulatory Visit | Attending: General Surgery | Admitting: General Surgery

## 2016-12-29 DIAGNOSIS — Z8249 Family history of ischemic heart disease and other diseases of the circulatory system: Secondary | ICD-10-CM | POA: Insufficient documentation

## 2016-12-29 DIAGNOSIS — G629 Polyneuropathy, unspecified: Secondary | ICD-10-CM | POA: Insufficient documentation

## 2016-12-29 DIAGNOSIS — Z880 Allergy status to penicillin: Secondary | ICD-10-CM | POA: Insufficient documentation

## 2016-12-29 DIAGNOSIS — E039 Hypothyroidism, unspecified: Secondary | ICD-10-CM | POA: Diagnosis not present

## 2016-12-29 DIAGNOSIS — M797 Fibromyalgia: Secondary | ICD-10-CM | POA: Diagnosis not present

## 2016-12-29 DIAGNOSIS — N6310 Unspecified lump in the right breast, unspecified quadrant: Secondary | ICD-10-CM

## 2016-12-29 DIAGNOSIS — G4730 Sleep apnea, unspecified: Secondary | ICD-10-CM | POA: Insufficient documentation

## 2016-12-29 DIAGNOSIS — N6312 Unspecified lump in the right breast, upper inner quadrant: Secondary | ICD-10-CM

## 2016-12-29 DIAGNOSIS — Z853 Personal history of malignant neoplasm of breast: Secondary | ICD-10-CM | POA: Diagnosis not present

## 2016-12-29 DIAGNOSIS — Z87892 Personal history of anaphylaxis: Secondary | ICD-10-CM | POA: Insufficient documentation

## 2016-12-29 DIAGNOSIS — Z836 Family history of other diseases of the respiratory system: Secondary | ICD-10-CM | POA: Insufficient documentation

## 2016-12-29 DIAGNOSIS — Z7982 Long term (current) use of aspirin: Secondary | ICD-10-CM | POA: Diagnosis not present

## 2016-12-29 DIAGNOSIS — K219 Gastro-esophageal reflux disease without esophagitis: Secondary | ICD-10-CM | POA: Insufficient documentation

## 2016-12-29 DIAGNOSIS — Z86010 Personal history of colonic polyps: Secondary | ICD-10-CM | POA: Insufficient documentation

## 2016-12-29 DIAGNOSIS — Z79899 Other long term (current) drug therapy: Secondary | ICD-10-CM | POA: Insufficient documentation

## 2016-12-29 DIAGNOSIS — Z791 Long term (current) use of non-steroidal anti-inflammatories (NSAID): Secondary | ICD-10-CM | POA: Insufficient documentation

## 2016-12-29 DIAGNOSIS — Z923 Personal history of irradiation: Secondary | ICD-10-CM | POA: Insufficient documentation

## 2016-12-29 DIAGNOSIS — F17210 Nicotine dependence, cigarettes, uncomplicated: Secondary | ICD-10-CM | POA: Diagnosis not present

## 2016-12-29 DIAGNOSIS — E7800 Pure hypercholesterolemia, unspecified: Secondary | ICD-10-CM | POA: Diagnosis not present

## 2016-12-29 DIAGNOSIS — M549 Dorsalgia, unspecified: Secondary | ICD-10-CM | POA: Diagnosis not present

## 2016-12-29 DIAGNOSIS — K449 Diaphragmatic hernia without obstruction or gangrene: Secondary | ICD-10-CM | POA: Diagnosis not present

## 2016-12-29 DIAGNOSIS — Z8719 Personal history of other diseases of the digestive system: Secondary | ICD-10-CM | POA: Insufficient documentation

## 2016-12-29 DIAGNOSIS — J449 Chronic obstructive pulmonary disease, unspecified: Secondary | ICD-10-CM | POA: Diagnosis not present

## 2016-12-29 DIAGNOSIS — N6031 Fibrosclerosis of right breast: Secondary | ICD-10-CM | POA: Insufficient documentation

## 2016-12-29 DIAGNOSIS — Z7951 Long term (current) use of inhaled steroids: Secondary | ICD-10-CM | POA: Diagnosis not present

## 2016-12-29 DIAGNOSIS — Z9221 Personal history of antineoplastic chemotherapy: Secondary | ICD-10-CM | POA: Insufficient documentation

## 2016-12-29 DIAGNOSIS — F418 Other specified anxiety disorders: Secondary | ICD-10-CM | POA: Insufficient documentation

## 2016-12-29 DIAGNOSIS — G8929 Other chronic pain: Secondary | ICD-10-CM | POA: Insufficient documentation

## 2016-12-29 DIAGNOSIS — Z9889 Other specified postprocedural states: Secondary | ICD-10-CM | POA: Insufficient documentation

## 2016-12-29 HISTORY — PX: BREAST BIOPSY: SHX20

## 2016-12-29 SURGERY — BREAST BIOPSY
Anesthesia: General | Laterality: Right

## 2016-12-29 MED ORDER — BUPIVACAINE HCL (PF) 0.5 % IJ SOLN
INTRAMUSCULAR | Status: AC
  Filled 2016-12-29: qty 30

## 2016-12-29 MED ORDER — FENTANYL CITRATE (PF) 100 MCG/2ML IJ SOLN
INTRAMUSCULAR | Status: DC | PRN
  Administered 2016-12-29: 25 ug via INTRAVENOUS
  Administered 2016-12-29: 50 ug via INTRAVENOUS

## 2016-12-29 MED ORDER — LACTATED RINGERS IV SOLN
INTRAVENOUS | Status: DC
  Administered 2016-12-29: 1000 mL via INTRAVENOUS

## 2016-12-29 MED ORDER — MIDAZOLAM HCL 2 MG/2ML IJ SOLN
1.0000 mg | INTRAMUSCULAR | Status: AC
  Administered 2016-12-29: 2 mg via INTRAVENOUS
  Filled 2016-12-29: qty 2

## 2016-12-29 MED ORDER — LIDOCAINE HCL (PF) 1 % IJ SOLN
INTRAMUSCULAR | Status: AC
  Filled 2016-12-29: qty 5

## 2016-12-29 MED ORDER — ROCURONIUM BROMIDE 50 MG/5ML IV SOLN
INTRAVENOUS | Status: AC
  Filled 2016-12-29: qty 1

## 2016-12-29 MED ORDER — FENTANYL CITRATE (PF) 100 MCG/2ML IJ SOLN
INTRAMUSCULAR | Status: AC
  Filled 2016-12-29: qty 2

## 2016-12-29 MED ORDER — CHLORHEXIDINE GLUCONATE CLOTH 2 % EX PADS: 6.0000 | MEDICATED_PAD | Freq: Once | CUTANEOUS | Status: DC

## 2016-12-29 MED ORDER — LIDOCAINE HCL (CARDIAC) 10 MG/ML IV SOLN
INTRAVENOUS | Status: DC | PRN
  Administered 2016-12-29: 50 mg via INTRAVENOUS

## 2016-12-29 MED ORDER — GLYCOPYRROLATE 0.2 MG/ML IJ SOLN
0.2000 mg | Freq: Once | INTRAMUSCULAR | Status: AC
  Administered 2016-12-29: 0.2 mg via INTRAVENOUS
  Filled 2016-12-29: qty 1

## 2016-12-29 MED ORDER — PROPOFOL 10 MG/ML IV BOLUS
INTRAVENOUS | Status: AC
  Filled 2016-12-29: qty 40

## 2016-12-29 MED ORDER — PROPOFOL 10 MG/ML IV BOLUS
INTRAVENOUS | Status: DC | PRN
  Administered 2016-12-29: 100 mg via INTRAVENOUS

## 2016-12-29 MED ORDER — BUPIVACAINE HCL (PF) 0.5 % IJ SOLN
INTRAMUSCULAR | Status: DC | PRN
  Administered 2016-12-29: 6 mL

## 2016-12-29 MED ORDER — ONDANSETRON HCL 4 MG/2ML IJ SOLN
4.0000 mg | Freq: Once | INTRAMUSCULAR | Status: AC
  Administered 2016-12-29: 4 mg via INTRAVENOUS
  Filled 2016-12-29: qty 2

## 2016-12-29 MED ORDER — FENTANYL CITRATE (PF) 100 MCG/2ML IJ SOLN: 25.0000 ug | INTRAMUSCULAR | Status: DC | PRN

## 2016-12-29 SURGICAL SUPPLY — 30 items
ADH SKN CLS APL DERMABOND .7 (GAUZE/BANDAGES/DRESSINGS) ×1
BAG HAMPER (MISCELLANEOUS) ×3 IMPLANT
BLADE SURG 15 STRL LF DISP TIS (BLADE) ×1 IMPLANT
BLADE SURG 15 STRL SS (BLADE)
CHLORAPREP W/TINT 26ML (MISCELLANEOUS) ×3 IMPLANT
CLOTH BEACON ORANGE TIMEOUT ST (SAFETY) ×3 IMPLANT
COVER LIGHT HANDLE STERIS (MISCELLANEOUS) ×6 IMPLANT
DECANTER SPIKE VIAL GLASS SM (MISCELLANEOUS) ×3 IMPLANT
DERMABOND ADVANCED (GAUZE/BANDAGES/DRESSINGS) ×2
DERMABOND ADVANCED .7 DNX12 (GAUZE/BANDAGES/DRESSINGS) IMPLANT
ELECT REM PT RETURN 9FT ADLT (ELECTROSURGICAL) ×3
ELECTRODE REM PT RTRN 9FT ADLT (ELECTROSURGICAL) ×1 IMPLANT
FORMALIN 10 PREFIL 120ML (MISCELLANEOUS) ×3 IMPLANT
GLOVE BIOGEL PI IND STRL 7.0 (GLOVE) ×1 IMPLANT
GLOVE BIOGEL PI INDICATOR 7.0 (GLOVE) ×2
GLOVE SURG SS PI 7.5 STRL IVOR (GLOVE) ×6 IMPLANT
GOWN STRL REUS W/TWL LRG LVL3 (GOWN DISPOSABLE) ×6 IMPLANT
KIT ROOM TURNOVER APOR (KITS) ×3 IMPLANT
MANIFOLD NEPTUNE II (INSTRUMENTS) ×3 IMPLANT
NDL HYPO 25X1 1.5 SAFETY (NEEDLE) ×1 IMPLANT
NEEDLE HYPO 25X1 1.5 SAFETY (NEEDLE) ×3 IMPLANT
NS IRRIG 1000ML POUR BTL (IV SOLUTION) ×3 IMPLANT
PACK MINOR (CUSTOM PROCEDURE TRAY) ×3 IMPLANT
PAD ARMBOARD 7.5X6 YLW CONV (MISCELLANEOUS) ×3 IMPLANT
SET BASIN LINEN APH (SET/KITS/TRAYS/PACK) ×3 IMPLANT
SUT SILK 2 0 SH (SUTURE) IMPLANT
SUT VIC AB 3-0 SH 27 (SUTURE) ×3
SUT VIC AB 3-0 SH 27X BRD (SUTURE) ×1 IMPLANT
SUT VIC AB 4-0 PS2 27 (SUTURE) ×3 IMPLANT
SYR CONTROL 10ML LL (SYRINGE) ×3 IMPLANT

## 2016-12-29 NOTE — Interval H&P Note (Signed)
History and Physical Interval Note:  12/29/2016 7:12 AM  Sydney Blair  has presented today for surgery, with the diagnosis of right breast mass  The various methods of treatment have been discussed with the patient and family. After consideration of risks, benefits and other options for treatment, the patient has consented to  Procedure(s): BREAST BIOPSY (Right) as a surgical intervention .  The patient's history has been reviewed, patient examined, no change in status, stable for surgery.  I have reviewed the patient's chart and labs.  Questions were answered to the patient's satisfaction.     Aviva Signs

## 2016-12-29 NOTE — Interval H&P Note (Signed)
History and Physical Interval Note:  12/29/2016 7:11 AM  Sydney Blair  has presented today for surgery, with the diagnosis of right breast mass  The various methods of treatment have been discussed with the patient and family. After consideration of risks, benefits and other options for treatment, the patient has consented to  Procedure(s): BREAST BIOPSY (Right) as a surgical intervention .  The patient's history has been reviewed, patient examined, no change in status, stable for surgery.  I have reviewed the patient's chart and labs.  Questions were answered to the patient's satisfaction.     Aviva Signs

## 2016-12-29 NOTE — Transfer of Care (Signed)
Immediate Anesthesia Transfer of Care Note  Patient: Sydney Blair  Procedure(s) Performed: Procedure(s): RIGHT BREAST BIOPSY (Right)  Patient Location: PACU  Anesthesia Type:General  Level of Consciousness: sedated and patient cooperative  Airway & Oxygen Therapy: Patient Spontanous Breathing and non-rebreather face mask  Post-op Assessment: Report given to RN and Post -op Vital signs reviewed and stable  Post vital signs: Reviewed and stable  Last Vitals:  Vitals:   12/29/16 0700 12/29/16 0715  BP: (!) 142/90 134/89  Resp: 19 16  Temp:      Last Pain:  Vitals:   12/29/16 0639  TempSrc: Oral      Patients Stated Pain Goal: 7 (57/84/69 [id number])  Complications: No apparent anesthesia complications

## 2016-12-29 NOTE — H&P (View-Only) (Signed)
Sydney Blair; [id number]; 04-17-1956   HPI Patient is a 61 year old white female status post right partial mastectomy with lymph node dissection, chemotherapy, and radiation therapy in 2005 who presents with a sudden thickening of the right breast at the surgical site. This occurred in February 2018. She underwent biopsy of this region by the breast center which was negative for malignancy. She has had no nipple discharge. The thickness of the skin and soft tissue in the upper, inner quadrant of the right breast is still present. It is tender to palpation and her pain is 5 out of 10.     Past Medical History:  Diagnosis Date  . Anxiety   . ARF (acute renal failure) ([place]) 08/07/2011  . Cancer ([place]) 2005   breast-right  . Chronic back pain   . COPD (chronic obstructive pulmonary disease) ([place])   . Depression   . Depression with anxiety   . Fibromyalgia   . GERD (gastroesophageal reflux disease)   . Hypercholesteremia   . Hypothyroidism   . Incontinence   . Neuropathy ([place])   . Peripheral neuropathy ([place])   . Sciatic pain   . Sebaceous cyst    lt labial location  . Sleep apnea    uses CPAP  . Tobacco abuse          Past Surgical History:  Procedure Laterality Date  . bladder tack    . BREAST LUMPECTOMY     right with node dissection  . COLONOSCOPY  11/11/2010   VOZ:[id number] rectal polyp and splenic flexure otherwise normal; pathology showed  tubular adenomas. Next TCS 10/2015.  Marland Kitchen ESOPHAGOGASTRODUODENOSCOPY  11/11/2010   QVZ:DGLOV hiatal hernia, 44 French dilator  . ESOPHAGOGASTRODUODENOSCOPY (EGD) WITH ESOPHAGEAL DILATION N/A 06/21/2013   FIE:PPIRJJ esophagus  - status post Maloney dilation s/p bx (benign esophageal bx)         Family History  Problem Relation Age of Onset  . Cancer Other   . Heart attack Other   . Lung disease Other           Current Outpatient Prescriptions on File Prior to Visit  Medication Sig  Dispense Refill  . albuterol (PROVENTIL HFA;VENTOLIN HFA) 108 (90 BASE) MCG/ACT inhaler Inhale 2 puffs into the lungs every 6 (six) hours as needed for wheezing or shortness of breath.    Marland Kitchen aspirin 81 MG chewable tablet Chew 81 mg by mouth daily.    . budesonide-formoterol (SYMBICORT) 160-4.5 MCG/ACT inhaler Inhale 2 puffs into the lungs 2 (two) times daily.    . Ca Carbonate-Mag Hydroxide (ROLAIDS PO) Take 1 tablet by mouth 3 (three) times daily as needed (for heartburn/indigestion.).     Marland Kitchen cetirizine (ZYRTEC) 10 MG tablet Take 10 mg by mouth daily.    Marland Kitchen dexlansoprazole (DEXILANT) 60 MG capsule Take 1 capsule (60 mg total) by mouth daily before breakfast. 30 capsule 11  . dicyclomine (BENTYL) 10 MG capsule TAKE 1 CAPSULE BY MOUTH FOUR TIMES DAILY BEFORE MEALS AND AT BEDTIME. HOLD IF CONSTIPATED 120 capsule 3  . DULoxetine (CYMBALTA) 60 MG capsule Take 60 mg by mouth daily.    . fluticasone (FLONASE) 50 MCG/ACT nasal spray Place 2 sprays into both nostrils daily.    Marland Kitchen gabapentin (NEURONTIN) 800 MG tablet Take 800 mg by mouth 4 (four) times daily.  2  . ibuprofen (ADVIL,MOTRIN) 800 MG tablet Take 800 mg by mouth every 8 (eight) hours as needed (for pain.).     Marland Kitchen levothyroxine (SYNTHROID, LEVOTHROID) 100 MCG tablet  Take 100 mcg by mouth daily before breakfast. Reported on 02/03/2016    . meclizine (ANTIVERT) 12.5 MG tablet Take 12.5 mg by mouth 2 (two) times daily as needed for dizziness.     . meloxicam (MOBIC) 15 MG tablet Take 15 mg by mouth daily. Reported on 02/03/2016    . methocarbamol (ROBAXIN) 500 MG tablet Take 500 mg by mouth 4 (four) times daily as needed for muscle spasms.    . nortriptyline (PAMELOR) 25 MG capsule Take 25 mg by mouth at bedtime.    . polyethylene glycol-electrolytes (TRILYTE) 420 g solution Take 4,000 mLs by mouth as directed. 4000 mL 0  . potassium chloride (K-DUR) 10 MEQ tablet Take 2 tablets (20 mEq total) by mouth 2 (two) times daily. For 3  days 12 tablet 0  . pravastatin (PRAVACHOL) 40 MG tablet Take 40 mg by mouth every evening.    . Tapentadol HCl (NUCYNTA) 100 MG TABS Take 100 mg by mouth 2 (two) times daily.    Marland Kitchen tiotropium (SPIRIVA) 18 MCG inhalation capsule Place 1 capsule (18 mcg total) into inhaler and inhale daily. 30 capsule 6  . TRINTELLIX 10 MG TABS Take 10 mg by mouth daily.  5  . zolpidem (AMBIEN) 5 MG tablet Take 10 mg by mouth at bedtime as needed for sleep.      No current facility-administered medications on file prior to visit.          Allergies  Allergen Reactions  . Penicillins Anaphylaxis    Has patient had a PCN reaction causing immediate rash, facial/tongue/throat swelling, SOB or lightheadedness with hypotension:Yes Has patient had a PCN reaction causing severe rash involving mucus membranes or skin necrosis:Yes Has patient had a PCN reaction that required hospitalization:No Has patient had a PCN reaction occurring within the last 10 years:No If all of the above answers are "NO", then may proceed with Cephalosporin use.        History  Alcohol Use No         History  Smoking Status  . Current Every Day Smoker  . Packs/day: 1.00  . Years: 35.00  . Types: Cigarettes  Smokeless Tobacco  . Never Used    Comment: one pack daily    Review of Systems  Constitutional: Positive for malaise/fatigue.  HENT: Positive for sinus pain.   Eyes: Negative.   Respiratory: Positive for cough, shortness of breath and wheezing.   Cardiovascular: Negative.   Gastrointestinal: Positive for heartburn.  Genitourinary: Negative.   Musculoskeletal: Positive for back pain, joint pain and neck pain.  Skin: Negative.   Neurological: Positive for dizziness and headaches.  Endo/Heme/Allergies: Negative.   Psychiatric/Behavioral: Negative.     Objective      Vitals:   12/02/16 0957  BP: (!) 141/80  Pulse: 91  Resp: 18  Temp: 97.8 F (36.6 C)    Physical Exam   Constitutional: She is oriented to person, place, and time and well-developed, well-nourished, and in no distress.  HENT:  Head: Normocephalic and atraumatic.  Neck: Normal range of motion. Neck supple.  Cardiovascular: Normal rate, regular rhythm and normal heart sounds.   No murmur heard. Pulmonary/Chest: Effort normal. She has wheezes. She has no rales.  Genitourinary:  Genitourinary Comments: Right breast is smaller than the left breast. A surgical scars present in the upper, inner quadrant of the right breast. Thickened skin with increased soft tissue density is noted in this region. It is indistinct in nature. No nipple discharge is noted.  Right axilla is negative for palpable nodes. Left breast reveals no dominant mass, nipple discharge, dimpling. Axilla is negative for palpable nodes.  Lymphadenopathy:    She has no cervical adenopathy.  Neurological: She is alert and oriented to person, place, and time.  Skin: Skin is warm and dry.  Vitals reviewed.  mammogram, ultrasound, and biopsy pathology results reviewed.  Assessment  Right breast mass, history of right breast malignancy Plan   Even though the core biopsy was negative, patient needs a right breast biopsy including skin to rule out recurrent right breast cancer. Patient understands and agrees. The patient scheduled for right breast biopsy on 12/13/2016. The risks and benefits of the procedure including bleeding and infection were fully explained to the patient, who gave informed consent.

## 2016-12-29 NOTE — Discharge Instructions (Signed)
Breast Biopsy, Care After These instructions give you information about caring for yourself after your procedure. Your doctor may also give you more specific instructions. Call your doctor if you have any problems or questions after your procedure. Follow these instructions at home: Medicines   Take over-the-counter and prescription medicines only as told by your doctor.  Do not drive for 24 hours if you received a sedative.  Do not drink alcohol while taking pain medicine.  Do not drive or use heavy machinery while taking prescription pain medicine. Biopsy Site Care    Follow instructions from your doctor about how to take care of your cut from surgery (incision) or puncture area. Make sure you:  Wash your hands with soap and water before you change your bandage. If you cannot use soap and water, use hand sanitizer.  Change any bandages (dressings) as told by your doctor.  Leave any stitches (sutures), skin glue, or skin tape (adhesive) strips in place. They may need to stay in place for 2 weeks or longer. If tape strips get loose and curl up, you may trim the loose edges. Do not remove tape strips completely unless your doctor says it is okay.  If you have stitches, keep them dry when you take a bath or a shower.  Check your cut or puncture area every day for signs of infection. Check for:  More redness, swelling, or pain.  More fluid or blood.  Warmth.  Pus or a bad smell.  Protect the biopsy area. Do not let the area get bumped. Activity   Avoid activities that could pull the biopsy site open.  Avoid stretching.  Avoid reaching.  Avoid exercise.  Avoid sports.  Avoid lifting anything that is heavier than 3 pounds (1.4 kg).  Return to your normal activities as told by your doctor. Ask your doctor what activities are safe for you. General instructions   Continue your normal diet.  Wear a good support bra for as long as told by your doctor.  Get checked for  extra fluid in your body (lymphedema) as often as told by your doctor.  Keep all follow-up visits as told by your doctor. This is important. Contact a health care provider if:  You have more redness, swelling, or pain at the biopsy site.  You have more fluid or blood coming from your biopsy site.  Your biopsy site feels warm to the touch.  You have pus or a bad smell coming from the biopsy site.  Your biopsy site breaks open after the stitches, staples, or skin tape strips have been removed.  You have a rash.  You have a fever. Get help right away if:  You have more bleeding (more than a small spot) from the biopsy site.  You have trouble breathing.  You have red streaks around the biopsy site. This information is not intended to replace advice given to you by your health care provider. Make sure you discuss any questions you have with your health care provider. Document Released: 06/05/2009 Document Revised: 04/15/2016 Document Reviewed: 05/13/2015 Elsevier Interactive Patient Education  2017 [employer]   PATIENT INSTRUCTIONS POST-ANESTHESIA  IMMEDIATELY FOLLOWING SURGERY:  Do not drive or operate machinery for the first twenty four hours after surgery.  Do not make any important decisions for twenty four hours after surgery or while taking narcotic pain medications or sedatives.  If you develop intractable nausea and vomiting or a severe headache please notify your doctor immediately.  FOLLOW-UP:  Please  make an appointment with your surgeon as instructed. You do not need to follow up with anesthesia unless specifically instructed to do so.  WOUND CARE INSTRUCTIONS (if applicable):  Keep a dry clean dressing on the anesthesia/puncture wound site if there is drainage.  Once the wound has quit draining you may leave it open to air.  Generally you should leave the bandage intact for twenty four hours unless there is drainage.  If the epidural site drains for more than 36-48  hours please call the anesthesia department.  QUESTIONS?:  Please feel free to call your physician or the hospital operator if you have any questions, and they will be happy to assist you.

## 2016-12-29 NOTE — Op Note (Signed)
Patient:  Sydney Blair  DOB:  [birth year]  MRN:  [id number]   Preop Diagnosis:  Right breast mass, history of right breast carcinoma  Postop Diagnosis:  Same  Procedure:  Right breast biopsy  Surgeon:  Aviva Signs, M.D.  Anes:  Gen.  Indications:  Patient is a 61 year old white female with a history of right breast cancer who presents with thickened skin and scarring along the incision in the upper, inner quadrant of the right breast. Initial core biopsy was negative for malignancy. The patient now comes to the operating room for right breast biopsy. The risks and benefits of the procedure including bleeding, infection, and the possibility of recurrence of the cancer were fully explained to the patient, who gave informed consent. Incidentally, a preoperative chest x-ray revealed a 5 cm mass in the right lung. A CT scan of the chest will be performed as an outpatient. The patient is aware of this.  Procedure note:  The patient was placed the supine position. After general anesthesia was administered, the right breast was prepped and draped using the usual sterile technique with DuraPrep. Surgical site confirmation was performed.  An elliptical skin incision was made in the upper, inner quadrant of the right breast nor to assess the thickened skin. Breast tissue deep to this was also excised. The specimen was sent to pathology for further examination. A bleeding was controlled using Bovie electrocautery. 0.5% Sensorcaine was instilled into the surrounding wound. The subcutaneous layer was reapproximated using a 3-0 Vicryl interrupted suture. The skin was closed using a 4-0 Vicryl subcuticular suture. Dermabond was applied.  All tape and needle counts were correct at the end of the procedure. The patient was awakened and transferred to PACU in stable condition.  Complications:  None  EBL:  Minimal  Specimen:  Right breast tissue

## 2016-12-29 NOTE — Anesthesia Procedure Notes (Signed)
Procedure Name: LMA Insertion Date/Time: 12/29/2016 7:35 AM Performed by: Vista Deck Pre-anesthesia Checklist: Patient identified, Patient being monitored, Emergency Drugs available, Timeout performed and Suction available Patient Re-evaluated:Patient Re-evaluated prior to inductionOxygen Delivery Method: Circle System Utilized Preoxygenation: Pre-oxygenation with 100% oxygen Intubation Type: IV induction Ventilation: Mask ventilation without difficulty LMA: LMA inserted LMA Size: 4.0 Number of attempts: 1 Placement Confirmation: positive ETCO2 and breath sounds checked- equal and bilateral Tube secured with: Tape Dental Injury: Teeth and Oropharynx as per pre-operative assessment

## 2016-12-29 NOTE — Anesthesia Postprocedure Evaluation (Signed)
Anesthesia Post Note  Patient: Sydney Blair  Procedure(s) Performed: Procedure(s) (LRB): RIGHT BREAST BIOPSY (Right)  Patient location during evaluation: Short Stay Anesthesia Type: General Level of consciousness: awake Pain management: satisfactory to patient Vital Signs Assessment: post-procedure vital signs reviewed and stable Respiratory status: spontaneous breathing Cardiovascular status: stable Anesthetic complications: no     Last Vitals:  Vitals:   12/29/16 0900 12/29/16 0903  BP: (!) 135/123 131/77  Pulse: 87 84  Resp: 15 18  Temp:  36.6 C    Last Pain:  Vitals:   12/29/16 0903  TempSrc: Oral  PainSc:                  [name]

## 2016-12-29 NOTE — Anesthesia Preprocedure Evaluation (Signed)
Anesthesia Evaluation  Patient identified by MRN, date of birth, ID band Patient awake    Reviewed: Allergy & Precautions, NPO status , Patient's Chart, lab work & pertinent test results  Airway Mallampati: II  TM Distance: >3 FB Neck ROM: Full    Dental  (+) Edentulous Upper, Edentulous Lower   Pulmonary shortness of breath, sleep apnea , COPD (raspy voice ),  COPD inhaler, Current Smoker,    breath sounds clear to auscultation       Cardiovascular + DOE   Rhythm:Regular Rate:Normal     Neuro/Psych PSYCHIATRIC DISORDERS Anxiety Depression  Neuromuscular disease    GI/Hepatic GERD  Controlled and Medicated,  Endo/Other  Hypothyroidism   Renal/GU Renal disease     Musculoskeletal  (+) Fibromyalgia -  Abdominal   Peds  Hematology   Anesthesia Other Findings   Reproductive/Obstetrics                            Anesthesia Physical Anesthesia Plan  ASA: III  Anesthesia Plan: General   Post-op Pain Management:    Induction: Intravenous  Airway Management Planned: LMA  Additional Equipment:   Intra-op Plan:   Post-operative Plan: Extubation in OR  Informed Consent: I have reviewed the patients History and Physical, chart, labs and discussed the procedure including the risks, benefits and alternatives for the proposed anesthesia with the patient or authorized representative who has indicated his/her understanding and acceptance.     Plan Discussed with:   Anesthesia Plan Comments:         Anesthesia Quick Evaluation

## 2016-12-30 ENCOUNTER — Encounter (HOSPITAL_COMMUNITY): Payer: Self-pay | Admitting: General Surgery

## 2017-01-04 ENCOUNTER — Encounter: Payer: Self-pay | Admitting: General Surgery

## 2017-01-04 ENCOUNTER — Ambulatory Visit (INDEPENDENT_AMBULATORY_CARE_PROVIDER_SITE_OTHER): Payer: Self-pay | Admitting: General Surgery

## 2017-01-04 VITALS — BP 153/88 | HR 95 | Temp 98.0°F | Resp 18 | Ht 60.0 in | Wt 178.0 lb

## 2017-01-04 DIAGNOSIS — Z09 Encounter for follow-up examination after completed treatment for conditions other than malignant neoplasm: Secondary | ICD-10-CM

## 2017-01-04 NOTE — Patient Instructions (Signed)
CXR shows 5cm right sided mass on preop evaluation.  Will need CT scan of chest with contrast.

## 2017-01-04 NOTE — Progress Notes (Signed)
Subjective:     Sydney Blair  Status post right breast biopsy. Patient doing well. Denies any incisional pain. Objective:    BP (!) 153/88   Pulse 95   Temp 98 F (36.7 C)   Resp 18   Ht 5' (1.524 m)   Wt 178 lb (80.7 kg)   BMI 34.76 kg/m   General:  alert, cooperative and no distress  Right breast incision healing well. No hematoma present. Final pathology negative for malignancy.     Assessment:    Doing well postoperatively.   Preoperative chest x-ray reveals 5 cm right-sided lung mass.   Plan:   Patient is aware of the chest x-ray results. She was instructed to notify her primary care physician who is she is seeing tomorrow for a follow-up CT scan with contrast of the right lung mass. Follow-up here as needed.

## 2017-01-06 ENCOUNTER — Telehealth: Payer: Self-pay

## 2017-01-06 NOTE — Telephone Encounter (Signed)
Pt is wanting to know if she can do the colon-graud test done instead of the TCS. Please advise

## 2017-01-07 NOTE — Telephone Encounter (Signed)
Please inform the patient and the following, colonoscopy the preferred method in her because she's had a history of precancerous colon polyps but ultimately the decision is up to her and would prefer Cologuard over doing nothing.   Cologuard is a test to DETECT colon cancer. About 50% of the time it can pick up on precancerous polyps that are large and ALMOST cancer. If it comes back positive, the next step would be a colonoscopy.  Cologuard is not meant to PREVENT colon cancer like a colonoscopy. With colonoscopy, PRECANCEROUS polyps can be detected at a higher rate and removed therefore PREVENTING colon cancer.   If she chooses Cologuard, she should have Cologuard done every 3 years.   PLEASE REITERATE THAT GIVEN HER HISTORY OF PRECANCEROUS COLON POLYPS IT IS LIKELY THAT SHE WILL HAVE MORE OF THEM AND IS BEST MANAGED VIA COLONOSCOPY.

## 2017-01-07 NOTE — Telephone Encounter (Signed)
Pt is aware and she is going to keep her TCS appointment

## 2017-01-14 ENCOUNTER — Ambulatory Visit
Admission: RE | Admit: 2017-01-14 | Discharge: 2017-01-14 | Disposition: A | Discharge status: Home / Self Care | Payer: Medicaid Other | Source: Ambulatory Visit | Attending: Specialist | Admitting: Specialist

## 2017-01-14 DIAGNOSIS — M5416 Radiculopathy, lumbar region: Secondary | ICD-10-CM

## 2017-01-14 DIAGNOSIS — M4316 Spondylolisthesis, lumbar region: Secondary | ICD-10-CM

## 2017-01-14 DIAGNOSIS — M48062 Spinal stenosis, lumbar region with neurogenic claudication: Secondary | ICD-10-CM

## 2017-01-28 NOTE — Patient Instructions (Signed)
Sydney Blair  01/28/2017     @PREFPERIOPPHARMACY @   Your procedure is scheduled on  02/08/2017   Report to [facility] at  800  A.M.  Call this number if you have problems the morning of surgery:  [phone]   Remember:  Do not eat food or drink liquids after midnight.  Take these medicines the morning of surgery with A SIP OF WATER  Zyrtec, dexilant, cymbalta, neurontin, levothyroxine, antivert, robaxin. Use your inhalers before you come.   Do not wear jewelry, make-up or nail polish.  Do not wear lotions, powders, or perfumes, or deoderant.  Do not shave 48 hours prior to surgery.  Men may shave face and neck.  Do not bring valuables to the hospital.  [facility] is not responsible for any belongings or valuables.  Contacts, dentures or bridgework may not be worn into surgery.  Leave your suitcase in the car.  After surgery it may be brought to your room.  For patients admitted to the hospital, discharge time will be determined by your treatment team.  Patients discharged the day of surgery will not be allowed to drive home.   Name and phone number of your driver:   family Special instructions:  Follow the diet and prep instructions given to you by Dr Roseanne Kaufman ofice.  Please read over the following fact sheets that you were given. Anesthesia Post-op Instructions and Care and Recovery After Surgery       Esophagogastroduodenoscopy Esophagogastroduodenoscopy (EGD) is a procedure to examine the lining of the esophagus, stomach, and first part of the small intestine (duodenum). This procedure is done to check for problems such as inflammation, bleeding, ulcers, or growths. During this procedure, a long, flexible, lighted tube with a camera attached (endoscope) is inserted down the throat. Tell a health care provider about:  Any allergies you have.  All medicines you are taking, including vitamins, herbs, eye drops, creams, and  over-the-counter medicines.  Any problems you or family members have had with anesthetic medicines.  Any blood disorders you have.  Any surgeries you have had.  Any medical conditions you have.  Whether you are pregnant or may be pregnant. What are the risks? Generally, this is a safe procedure. However, problems may occur, including:  Infection.  Bleeding.  A tear (perforation) in the esophagus, stomach, or duodenum.  Trouble breathing.  Excessive sweating.  Spasms of the larynx.  A slowed heartbeat.  Low blood pressure.  What happens before the procedure?  Follow instructions from your health care provider about eating or drinking restrictions.  Ask your health care provider about: ? Changing or stopping your regular medicines. This is especially important if you are taking diabetes medicines or blood thinners. ? Taking medicines such as aspirin and ibuprofen. These medicines can thin your blood. Do not take these medicines before your procedure if your health care provider instructs you not to.  Plan to have someone take you home after the procedure.  If you wear dentures, be ready to remove them before the procedure. What happens during the procedure?  To reduce your risk of infection, your health care team will wash or sanitize their hands.  An IV tube will be put in a vein in your hand or arm. You will get medicines and fluids through this tube.  You will be given one or more of the following: ? A medicine to help  you relax (sedative). ? A medicine to numb the area (local anesthetic). This medicine may be sprayed into your throat. It will make you feel more comfortable and keep you from gagging or coughing during the procedure. ? A medicine for pain.  A mouth guard may be placed in your mouth to protect your teeth and to keep you from biting on the endoscope.  You will be asked to lie on your left side.  The endoscope will be lowered down your throat into  your esophagus, stomach, and duodenum.  Air will be put into the endoscope. This will help your health care provider see better.  The lining of your esophagus, stomach, and duodenum will be examined.  Your health care provider may: ? Take a tissue sample so it can be looked at in a lab (biopsy). ? Remove growths. ? Remove objects (foreign bodies) that are stuck. ? Treat any bleeding with medicines or other devices that stop tissue from bleeding. ? Widen (dilate) or stretch narrowed areas of your esophagus and stomach.  The endoscope will be taken out. The procedure may vary among health care providers and hospitals. What happens after the procedure?  Your blood pressure, heart rate, breathing rate, and blood oxygen level will be monitored often until the medicines you were given have worn off.  Do not eat or drink anything until the numbing medicine has worn off and your gag reflex has returned. This information is not intended to replace advice given to you by your health care provider. Make sure you discuss any questions you have with your health care provider. Document Released: 12/10/2004 Document Revised: 01/15/2016 Document Reviewed: 07/03/2015 Elsevier Interactive Patient Education  2018 [employer]. Esophagogastroduodenoscopy, Care After Refer to this sheet in the next few weeks. These instructions provide you with information about caring for yourself after your procedure. Your health care provider may also give you more specific instructions. Your treatment has been planned according to current medical practices, but problems sometimes occur. Call your health care provider if you have any problems or questions after your procedure. What can I expect after the procedure? After the procedure, it is common to have:  A sore throat.  Nausea.  Bloating.  Dizziness.  Fatigue.  Follow these instructions at home:  Do not eat or drink anything until the numbing medicine  (local anesthetic) has worn off and your gag reflex has returned. You will know that the local anesthetic has worn off when you can swallow comfortably.  Do not drive for 24 hours if you received a medicine to help you relax (sedative).  If your health care provider took a tissue sample for testing during the procedure, make sure to get your test results. This is your responsibility. Ask your health care provider or the department performing the test when your results will be ready.  Keep all follow-up visits as told by your health care provider. This is important. Contact a health care provider if:  You cannot stop coughing.  You are not urinating.  You are urinating less than usual. Get help right away if:  You have trouble swallowing.  You cannot eat or drink.  You have throat or chest pain that gets worse.  You are dizzy or light-headed.  You faint.  You have nausea or vomiting.  You have chills.  You have a fever.  You have severe abdominal pain.  You have black, tarry, or bloody stools. This information is not intended to replace advice given to  you by your health care provider. Make sure you discuss any questions you have with your health care provider. Document Released: 07/26/2012 Document Revised: 01/15/2016 Document Reviewed: 07/03/2015 Elsevier Interactive Patient Education  2018 [employer].  Esophageal Dilatation Esophageal dilatation is a procedure to open a blocked or narrowed part of the esophagus. The esophagus is the long tube in your throat that carries food and liquid from your mouth to your stomach. The procedure is also called esophageal dilation. You may need this procedure if you have a buildup of scar tissue in your esophagus that makes it difficult, painful, or even impossible to swallow. This can be caused by gastroesophageal reflux disease (GERD). In rare cases, people need this procedure because they have cancer of the esophagus or a problem  with the way food moves through the esophagus. Sometimes you may need to have another dilatation to enlarge the opening of the esophagus gradually. Tell a health care provider about:  Any allergies you have.  All medicines you are taking, including vitamins, herbs, eye drops, creams, and over-the-counter medicines.  Any problems you or family members have had with anesthetic medicines.  Any blood disorders you have.  Any surgeries you have had.  Any medical conditions you have.  Any antibiotic medicines you are required to take before dental procedures. What are the risks? Generally, this is a safe procedure. However, problems can occur and include:  Bleeding from a tear in the lining of the esophagus.  A hole (perforation) in the esophagus.  What happens before the procedure?  Do not eat or drink anything after midnight on the night before the procedure or as directed by your health care provider.  Ask your health care provider about changing or stopping your regular medicines. This is especially important if you are taking diabetes medicines or blood thinners.  Plan to have someone take you home after the procedure. What happens during the procedure?  You will be given a medicine that makes you relaxed and sleepy (sedative).  A medicine may be sprayed or gargled to numb the back of the throat.  Your health care provider can use various instruments to do an esophageal dilatation. During the procedure, the instrument used will be placed in your mouth and passed down into your esophagus. Options include: ? Simple dilators. This instrument is carefully placed in the esophagus to stretch it. ? Guided wire bougies. In this method, a flexible tube (endoscope) is used to insert a wire into the esophagus. The dilator is passed over this wire to enlarge the esophagus. Then the wire is removed. ? Balloon dilators. An endoscope with a small balloon at the end is passed down into the  esophagus. Inflating the balloon gently stretches the esophagus and opens it up. What happens after the procedure?  Your blood pressure, heart rate, breathing rate, and blood oxygen level will be monitored often until the medicines you were given have worn off.  Your throat may feel slightly sore and will probably still feel numb. This will improve slowly over time.  You will not be allowed to eat or drink until the throat numbness has resolved.  If this is a same-day procedure, you may be allowed to go home once you have been able to drink, urinate, and sit on the edge of the bed without nausea or dizziness.  If this is a same-day procedure, you should have a friend or family member with you for the next 24 hours after the procedure. This information  is not intended to replace advice given to you by your health care provider. Make sure you discuss any questions you have with your health care provider. Document Released: 09/30/2005 Document Revised: 01/15/2016 Document Reviewed: 12/19/2013 Elsevier Interactive Patient Education  2017 [place].  Colonoscopy, Adult A colonoscopy is an exam to look at the entire large intestine. During the exam, a lubricated, bendable tube is inserted into the anus and then passed into the rectum, colon, and other parts of the large intestine. A colonoscopy is often done as a part of normal colorectal screening or in response to certain symptoms, such as anemia, persistent diarrhea, abdominal pain, and blood in the stool. The exam can help screen for and diagnose medical problems, including:  Tumors.  Polyps.  Inflammation.  Areas of bleeding.  Tell a health care provider about:  Any allergies you have.  All medicines you are taking, including vitamins, herbs, eye drops, creams, and over-the-counter medicines.  Any problems you or family members have had with anesthetic medicines.  Any blood disorders you have.  Any surgeries you have  had.  Any medical conditions you have.  Any problems you have had passing stool. What are the risks? Generally, this is a safe procedure. However, problems may occur, including:  Bleeding.  A tear in the intestine.  A reaction to medicines given during the exam.  Infection (rare).  What happens before the procedure? Eating and drinking restrictions Follow instructions from your health care provider about eating and drinking, which may include:  A few days before the procedure - follow a low-fiber diet. Avoid nuts, seeds, dried fruit, raw fruits, and vegetables.  1-3 days before the procedure - follow a clear liquid diet. Drink only clear liquids, such as clear broth or bouillon, black coffee or tea, clear juice, clear soft drinks or sports drinks, gelatin dessert, and popsicles. Avoid any liquids that contain red or purple dye.  On the day of the procedure - do not eat or drink anything during the 2 hours before the procedure, or within the time period that your health care provider recommends.  Bowel prep If you were prescribed an oral bowel prep to clean out your colon:  Take it as told by your health care provider. Starting the day before your procedure, you will need to drink a large amount of medicated liquid. The liquid will cause you to have multiple loose stools until your stool is almost clear or light green.  If your skin or anus gets irritated from diarrhea, you may use these to relieve the irritation: ? Medicated wipes, such as adult wet wipes with aloe and vitamin E. ? A skin soothing-product like petroleum jelly.  If you vomit while drinking the bowel prep, take a break for up to 60 minutes and then begin the bowel prep again. If vomiting continues and you cannot take the bowel prep without vomiting, call your health care provider.  General instructions  Ask your health care provider about changing or stopping your regular medicines. This is especially important  if you are taking diabetes medicines or blood thinners.  Plan to have someone take you home from the hospital or clinic. What happens during the procedure?  An IV tube may be inserted into one of your veins.  You will be given medicine to help you relax (sedative).  To reduce your risk of infection: ? Your health care team will wash or sanitize their hands. ? Your anal area will be washed with soap.  You will be asked to lie on your side with your knees bent.  Your health care provider will lubricate a long, thin, flexible tube. The tube will have a camera and a light on the end.  The tube will be inserted into your anus.  The tube will be gently eased through your rectum and colon.  Air will be delivered into your colon to keep it open. You may feel some pressure or cramping.  The camera will be used to take images during the procedure.  A small tissue sample may be removed from your body to be examined under a microscope (biopsy). If any potential problems are found, the tissue will be sent to a lab for testing.  If small polyps are found, your health care provider may remove them and have them checked for cancer cells.  The tube that was inserted into your anus will be slowly removed. The procedure may vary among health care providers and hospitals. What happens after the procedure?  Your blood pressure, heart rate, breathing rate, and blood oxygen level will be monitored until the medicines you were given have worn off.  Do not drive for 24 hours after the exam.  You may have a small amount of blood in your stool.  You may pass gas and have mild abdominal cramping or bloating due to the air that was used to inflate your colon during the exam.  It is up to you to get the results of your procedure. Ask your health care provider, or the department performing the procedure, when your results will be ready. This information is not intended to replace advice given to you by  your health care provider. Make sure you discuss any questions you have with your health care provider. Document Released: 08/06/2000 Document Revised: 06/09/2016 Document Reviewed: 10/21/2015 Elsevier Interactive Patient Education  2018 [employer].  Colonoscopy, Adult, Care After This sheet gives you information about how to care for yourself after your procedure. Your health care provider may also give you more specific instructions. If you have problems or questions, contact your health care provider. What can I expect after the procedure? After the procedure, it is common to have:  A small amount of blood in your stool for 24 hours after the procedure.  Some gas.  Mild abdominal cramping or bloating.  Follow these instructions at home: General instructions   For the first 24 hours after the procedure: ? Do not drive or use machinery. ? Do not sign important documents. ? Do not drink alcohol. ? Do your regular daily activities at a slower pace than normal. ? Eat soft, easy-to-digest foods. ? Rest often.  Take over-the-counter or prescription medicines only as told by your health care provider.  It is up to you to get the results of your procedure. Ask your health care provider, or the department performing the procedure, when your results will be ready. Relieving cramping and bloating  Try walking around when you have cramps or feel bloated.  Apply heat to your abdomen as told by your health care provider. Use a heat source that your health care provider recommends, such as a moist heat pack or a heating pad. ? Place a towel between your skin and the heat source. ? Leave the heat on for 20-30 minutes. ? Remove the heat if your skin turns bright red. This is especially important if you are unable to feel pain, heat, or cold. You may have a greater risk of getting burned.  Eating and drinking  Drink enough fluid to keep your urine clear or pale yellow.  Resume your normal  diet as instructed by your health care provider. Avoid heavy or fried foods that are hard to digest.  Avoid drinking alcohol for as long as instructed by your health care provider. Contact a health care provider if:  You have blood in your stool 2-3 days after the procedure. Get help right away if:  You have more than a small spotting of blood in your stool.  You pass large blood clots in your stool.  Your abdomen is swollen.  You have nausea or vomiting.  You have a fever.  You have increasing abdominal pain that is not relieved with medicine. This information is not intended to replace advice given to you by your health care provider. Make sure you discuss any questions you have with your health care provider. Document Released: 03/23/2004 Document Revised: 05/03/2016 Document Reviewed: 10/21/2015 Elsevier Interactive Patient Education  2018 [place] Anesthesia is a term that refers to techniques, procedures, and medicines that help a person stay safe and comfortable during a medical procedure. Monitored anesthesia care, or sedation, is one type of anesthesia. Your anesthesia specialist may recommend sedation if you will be having a procedure that does not require you to be unconscious, such as:  Cataract surgery.  A dental procedure.  A biopsy.  A colonoscopy.  During the procedure, you may receive a medicine to help you relax (sedative). There are three levels of sedation:  Mild sedation. At this level, you may feel awake and relaxed. You will be able to follow directions.  Moderate sedation. At this level, you will be sleepy. You may not remember the procedure.  Deep sedation. At this level, you will be asleep. You will not remember the procedure.  The more medicine you are given, the deeper your level of sedation will be. Depending on how you respond to the procedure, the anesthesia specialist may change your level of sedation or the  type of anesthesia to fit your needs. An anesthesia specialist will monitor you closely during the procedure. Let your health care provider know about:  Any allergies you have.  All medicines you are taking, including vitamins, herbs, eye drops, creams, and over-the-counter medicines.  Any use of steroids (by mouth or as a cream).  Any problems you or family members have had with sedatives and anesthetic medicines.  Any blood disorders you have.  Any surgeries you have had.  Any medical conditions you have, such as sleep apnea.  Whether you are pregnant or may be pregnant.  Any use of cigarettes, alcohol, or street drugs. What are the risks? Generally, this is a safe procedure. However, problems may occur, including:  Getting too much medicine (oversedation).  Nausea.  Allergic reaction to medicines.  Trouble breathing. If this happens, a breathing tube may be used to help with breathing. It will be removed when you are awake and breathing on your own.  Heart trouble.  Lung trouble.  Before the procedure Staying hydrated Follow instructions from your health care provider about hydration, which may include:  Up to 2 hours before the procedure - you may continue to drink clear liquids, such as water, clear fruit juice, black coffee, and plain tea.  Eating and drinking restrictions Follow instructions from your health care provider about eating and drinking, which may include:  8 hours before the procedure - stop eating heavy meals or foods such  as meat, fried foods, or fatty foods.  6 hours before the procedure - stop eating light meals or foods, such as toast or cereal.  6 hours before the procedure - stop drinking milk or drinks that contain milk.  2 hours before the procedure - stop drinking clear liquids.  Medicines Ask your health care provider about:  Changing or stopping your regular medicines. This is especially important if you are taking diabetes  medicines or blood thinners.  Taking medicines such as aspirin and ibuprofen. These medicines can thin your blood. Do not take these medicines before your procedure if your health care provider instructs you not to.  Tests and exams  You will have a physical exam.  You may have blood tests done to show: ? How well your kidneys and liver are working. ? How well your blood can clot.  General instructions  Plan to have someone take you home from the hospital or clinic.  If you will be going home right after the procedure, plan to have someone with you for 24 hours.  What happens during the procedure?  Your blood pressure, heart rate, breathing, level of pain and overall condition will be monitored.  An IV tube will be inserted into one of your veins.  Your anesthesia specialist will give you medicines as needed to keep you comfortable during the procedure. This may mean changing the level of sedation.  The procedure will be performed. After the procedure  Your blood pressure, heart rate, breathing rate, and blood oxygen level will be monitored until the medicines you were given have worn off.  Do not drive for 24 hours if you received a sedative.  You may: ? Feel sleepy, clumsy, or nauseous. ? Feel forgetful about what happened after the procedure. ? Have a sore throat if you had a breathing tube during the procedure. ? Vomit. This information is not intended to replace advice given to you by your health care provider. Make sure you discuss any questions you have with your health care provider. Document Released: 05/05/2005 Document Revised: 01/16/2016 Document Reviewed: 11/30/2015 Elsevier Interactive Patient Education  2018 [place], Care After These instructions provide you with information about caring for yourself after your procedure. Your health care provider may also give you more specific instructions. Your treatment has been planned  according to current medical practices, but problems sometimes occur. Call your health care provider if you have any problems or questions after your procedure. What can I expect after the procedure? After your procedure, it is common to:  Feel sleepy for several hours.  Feel clumsy and have poor balance for several hours.  Feel forgetful about what happened after the procedure.  Have poor judgment for several hours.  Feel nauseous or vomit.  Have a sore throat if you had a breathing tube during the procedure.  Follow these instructions at home: For at least 24 hours after the procedure:   Do not: ? Participate in activities in which you could fall or become injured. ? Drive. ? Use heavy machinery. ? Drink alcohol. ? Take sleeping pills or medicines that cause drowsiness. ? Make important decisions or sign legal documents. ? Take care of children on your own.  Rest. Eating and drinking  Follow the diet that is recommended by your health care provider.  If you vomit, drink water, juice, or soup when you can drink without vomiting.  Make sure you have little or no nausea before eating solid foods.  General instructions  Have a responsible adult stay with you until you are awake and alert.  Take over-the-counter and prescription medicines only as told by your health care provider.  If you smoke, do not smoke without supervision.  Keep all follow-up visits as told by your health care provider. This is important. Contact a health care provider if:  You keep feeling nauseous or you keep vomiting.  You feel light-headed.  You develop a rash.  You have a fever. Get help right away if:  You have trouble breathing. This information is not intended to replace advice given to you by your health care provider. Make sure you discuss any questions you have with your health care provider. Document Released: 11/30/2015 Document Revised: 03/31/2016 Document Reviewed:  11/30/2015 Elsevier Interactive Patient Education  [employer].

## 2017-02-01 ENCOUNTER — Ambulatory Visit (INDEPENDENT_AMBULATORY_CARE_PROVIDER_SITE_OTHER): Payer: Medicaid Other | Admitting: Pulmonary Disease

## 2017-02-01 ENCOUNTER — Encounter: Payer: Self-pay | Admitting: Pulmonary Disease

## 2017-02-01 VITALS — BP 136/78 | HR 89 | Ht 60.0 in | Wt 177.0 lb

## 2017-02-01 DIAGNOSIS — R918 Other nonspecific abnormal finding of lung field: Secondary | ICD-10-CM | POA: Diagnosis not present

## 2017-02-01 DIAGNOSIS — F17200 Nicotine dependence, unspecified, uncomplicated: Secondary | ICD-10-CM | POA: Diagnosis not present

## 2017-02-01 DIAGNOSIS — J438 Other emphysema: Secondary | ICD-10-CM

## 2017-02-01 NOTE — Assessment & Plan Note (Signed)
Counsel to quit smoking today

## 2017-02-01 NOTE — Assessment & Plan Note (Signed)
I have reviewed the images on her chest x-ray independently today showing a large right upper lung mass. Given her smoking history I'm very worried that this represents primary lung cancer.  Plan: Arrange for a PET scan to see if there are evidence of distant metastasis Will likely need a bronchoscopy, we'll check a CBC today to ensure her bleeding risk is minimal

## 2017-02-01 NOTE — Progress Notes (Signed)
Subjective:    Patient ID: Sydney Blair, female    DOB: 27-May-1956, 61 y.o.   MRN: [id number]   Synopsis: Referred back to the pulmonary clinic in 2018 for evaluation of an abnormal chest x-ray. Has a history of COPD and was previously seen by Dr. Gwenette Blair.   HPI Chief Complaint  Patient presents with  . Advice Only    Former [facility] pt last seen 01/2015- referred back to clinic for abn cxr.     Sydney Blair was referred to [place] because she had an abnormal CXR.  She says that she was "having her breast checked out" with a CXR which was abnormal.  She has not had any new respiratory complaints.   She has not had a CT scan of her lungs yet.    She reports no bronchitis or pneumonia.  She received treatment for breast cancer from 2005 through 2006.   She had a breast biopsy on 12/2016 which showed fibrotic scar tissue in her R breast, but no evidence of cancer.    She notes that she has been experiencing shortness for years and this has not changed much lately.  She says that sometimes it is difficult to walk a long ways on level ground.  She has a cough but this hasn't changed, its dry and never bloody.  If she is in an enclosed area she will cough.  No chest apin,, no weight loss.   She used to work as a Patent examiner.  Never worked with chemicals or in Psychologist, educational.   She smokes 1ppd, used to smoke more than this 2-3 ppd for a few years in the past.  She has smoked for 30 years.     Past Medical History:  Diagnosis Date  . Anxiety   . ARF (acute renal failure) ([place]) 08/07/2011  . Cancer ([facility]) 2005   breast-right  . Chronic back pain   . COPD (chronic obstructive pulmonary disease) ([place])   . Depression   . Depression with anxiety   . Fibromyalgia   . GERD (gastroesophageal reflux disease)   . Hypercholesteremia   . Hypothyroidism   . Incontinence   . Neuropathy   . Peripheral neuropathy   . Sciatic pain   . Sebaceous cyst    lt labial location  . Sleep  apnea    uses CPAP  . Tobacco abuse      Family History  Problem Relation Age of Onset  . Cancer Other   . Heart attack Other   . Lung disease Other   . Emphysema Mother      Social History   Social History  . Marital status: Legally Separated    Spouse name: N/A  . Number of children: 1  . Years of education: N/A   Occupational History  . disabled Unemployed   Social History Main Topics  . Smoking status: Current Every Day Smoker    Packs/day: 1.50    Years: 35.00    Types: Cigarettes  . Smokeless tobacco: Never Used     Comment: has cut back to one pack daily  . Alcohol use No  . Drug use: No  . Sexual activity: Yes    Birth control/ protection: Post-menopausal     Comment: chemo stopped her periods   Other Topics Concern  . Not on file   Social History Narrative   Lives w. Husband   Lost 1 son-?etiology     Allergies  Allergen Reactions  .  Penicillins Anaphylaxis    Has patient had a PCN reaction causing immediate rash, facial/tongue/throat swelling, SOB or lightheadedness with hypotension:Yes Has patient had a PCN reaction causing severe rash involving mucus membranes or skin necrosis:Yes Has patient had a PCN reaction that required hospitalization:No Has patient had a PCN reaction occurring within the last 10 years:No If all of the above answers are "NO", then may proceed with Cephalosporin use.      Outpatient Medications Prior to Visit  Medication Sig Dispense Refill  . albuterol (PROVENTIL HFA;VENTOLIN HFA) 108 (90 BASE) MCG/ACT inhaler Inhale 2 puffs into the lungs every 6 (six) hours as needed for wheezing or shortness of breath.    Marland Kitchen aspirin 81 MG chewable tablet Chew 81 mg by mouth daily.    . budesonide-formoterol (SYMBICORT) 160-4.5 MCG/ACT inhaler Inhale 2 puffs into the lungs daily.     Marland Kitchen CALCIUM PO Take 1 tablet by mouth daily.    . cetirizine (ZYRTEC) 10 MG tablet Take 10 mg by mouth daily.    Marland Kitchen dexlansoprazole (DEXILANT) 60 MG capsule  Take 1 capsule (60 mg total) by mouth daily before breakfast. 30 capsule 11  . diclofenac sodium (VOLTAREN) 1 % GEL Apply 4 g topically 4 (four) times daily.    . DULoxetine (CYMBALTA) 60 MG capsule Take 60 mg by mouth daily.    . fluticasone (FLONASE) 50 MCG/ACT nasal spray Place 2 sprays into both nostrils daily.    Marland Kitchen gabapentin (NEURONTIN) 800 MG tablet Take 800 mg by mouth 4 (four) times daily.  2  . ibuprofen (ADVIL,MOTRIN) 800 MG tablet Take 800 mg by mouth every 8 (eight) hours as needed for moderate pain.     Marland Kitchen levothyroxine (SYNTHROID, LEVOTHROID) 100 MCG tablet Take 100 mcg by mouth daily before breakfast. Reported on 02/03/2016    . meclizine (ANTIVERT) 25 MG tablet Take 25 mg by mouth daily.    . methocarbamol (ROBAXIN) 500 MG tablet Take 500 mg by mouth 4 (four) times daily.     . nortriptyline (PAMELOR) 25 MG capsule Take 25 mg by mouth at bedtime.    Marland Kitchen olopatadine (PATANOL) 0.1 % ophthalmic solution Place 1 drop into both eyes 2 (two) times daily.     . polyethylene glycol-electrolytes (TRILYTE) 420 g solution Take 4,000 mLs by mouth as directed. 4000 mL 0  . pravastatin (PRAVACHOL) 40 MG tablet Take 40 mg by mouth every evening.    . Tapentadol HCl (NUCYNTA) 100 MG TABS Take 100 mg by mouth 2 (two) times daily.    Marland Kitchen tiotropium (SPIRIVA) 18 MCG inhalation capsule Place 1 capsule (18 mcg total) into inhaler and inhale daily. 30 capsule 6  . TRINTELLIX 10 MG TABS Take 10 mg by mouth daily.  5  . zolpidem (AMBIEN) 10 MG tablet Take 10 mg by mouth at bedtime as needed for sleep.     No facility-administered medications prior to visit.       Review of Systems  Constitutional: Negative for fever and unexpected weight change.  HENT: Negative for congestion, dental problem, ear pain, nosebleeds, postnasal drip, rhinorrhea, sinus pressure, sneezing, sore throat and trouble swallowing.   Eyes: Negative for redness and itching.  Respiratory: Positive for cough, chest tightness and  shortness of breath. Negative for wheezing.   Cardiovascular: Negative for palpitations and leg swelling.  Gastrointestinal: Negative for nausea and vomiting.  Genitourinary: Negative for dysuria.  Musculoskeletal: Negative for joint swelling.  Skin: Negative for rash.  Neurological: Negative for headaches.  Hematological:  Does not bruise/bleed easily.  Psychiatric/Behavioral: Negative for dysphoric mood. The patient is not nervous/anxious.        Objective:   Physical Exam Vitals:   02/01/17 1501  BP: 136/78  Pulse: 89  SpO2: 97%  Weight: 177 lb (80.3 kg)  Height: 5' (1.524 m)    Gen: chronically ill appearing, no acute distress HENT: NCAT, OP clear, neck supple without masses Eyes: PERRL, EOMi Lymph: no cervical lymphadenopathy PULM: CTA B CV: RRR, no mgr, no JVD GI: BS+, soft, nontender, no hsm Derm: no rash or skin breakdown MSK: normal bulk and tone Neuro: A&Ox4, CN II-XII intact, strength 5/5 in all 4 extremities Psyche: normal mood and affect    PFT: PFT's 12/2012:  FEV1 1.34 (53%), ratio 63, TLC normal, +airtrapping, DLCO 52%.    Labs: AAT 11/2012:  Normal level, MM  Chest imaging: 2018 chest x-ray images independently reviewed showing a right upper lobe mass.     Assessment & Plan:  Lung mass I have reviewed the images on her chest x-ray independently today showing a large right upper lung mass. Given her smoking history I'm very worried that this represents primary lung cancer.  Plan: Arrange for a PET scan to see if there are evidence of distant metastasis Will likely need a bronchoscopy, we'll check a CBC today to ensure her bleeding risk is minimal  COPD (chronic obstructive pulmonary disease) with emphysema (HCC) Moderate airflow obstruction with some functional limitation but no recent change in symptoms over the last several years.  Plan: Counsel to quit smoking Continue Spiriva Continue Symbicort  TOBACCO ABUSE Counsel to quit smoking  today    Current Outpatient Prescriptions:  .  albuterol (PROVENTIL HFA;VENTOLIN HFA) 108 (90 BASE) MCG/ACT inhaler, Inhale 2 puffs into the lungs every 6 (six) hours as needed for wheezing or shortness of breath., Disp: , Rfl:  .  aspirin 81 MG chewable tablet, Chew 81 mg by mouth daily., Disp: , Rfl:  .  budesonide-formoterol (SYMBICORT) 160-4.5 MCG/ACT inhaler, Inhale 2 puffs into the lungs daily. , Disp: , Rfl:  .  CALCIUM PO, Take 1 tablet by mouth daily., Disp: , Rfl:  .  cetirizine (ZYRTEC) 10 MG tablet, Take 10 mg by mouth daily., Disp: , Rfl:  .  dexlansoprazole (DEXILANT) 60 MG capsule, Take 1 capsule (60 mg total) by mouth daily before breakfast., Disp: 30 capsule, Rfl: 11 .  diclofenac sodium (VOLTAREN) 1 % GEL, Apply 4 g topically 4 (four) times daily., Disp: , Rfl:  .  DULoxetine (CYMBALTA) 60 MG capsule, Take 60 mg by mouth daily., Disp: , Rfl:  .  fluticasone (FLONASE) 50 MCG/ACT nasal spray, Place 2 sprays into both nostrils daily., Disp: , Rfl:  .  gabapentin (NEURONTIN) 800 MG tablet, Take 800 mg by mouth 4 (four) times daily., Disp: , Rfl: 2 .  ibuprofen (ADVIL,MOTRIN) 800 MG tablet, Take 800 mg by mouth every 8 (eight) hours as needed for moderate pain. , Disp: , Rfl:  .  levothyroxine (SYNTHROID, LEVOTHROID) 100 MCG tablet, Take 100 mcg by mouth daily before breakfast. Reported on 02/03/2016, Disp: , Rfl:  .  meclizine (ANTIVERT) 25 MG tablet, Take 25 mg by mouth daily., Disp: , Rfl:  .  methocarbamol (ROBAXIN) 500 MG tablet, Take 500 mg by mouth 4 (four) times daily. , Disp: , Rfl:  .  nortriptyline (PAMELOR) 25 MG capsule, Take 25 mg by mouth at bedtime., Disp: , Rfl:  .  olopatadine (PATANOL) 0.1 % ophthalmic solution,  Place 1 drop into both eyes 2 (two) times daily. , Disp: , Rfl:  .  polyethylene glycol-electrolytes (TRILYTE) 420 g solution, Take 4,000 mLs by mouth as directed., Disp: 4000 mL, Rfl: 0 .  pravastatin (PRAVACHOL) 40 MG tablet, Take 40 mg by mouth every  evening., Disp: , Rfl:  .  Tapentadol HCl (NUCYNTA) 100 MG TABS, Take 100 mg by mouth 2 (two) times daily., Disp: , Rfl:  .  tiotropium (SPIRIVA) 18 MCG inhalation capsule, Place 1 capsule (18 mcg total) into inhaler and inhale daily., Disp: 30 capsule, Rfl: 6 .  TRINTELLIX 10 MG TABS, Take 10 mg by mouth daily., Disp: , Rfl: 5 .  zolpidem (AMBIEN) 10 MG tablet, Take 10 mg by mouth at bedtime as needed for sleep., Disp: , Rfl:

## 2017-02-01 NOTE — Assessment & Plan Note (Signed)
Moderate airflow obstruction with some functional limitation but no recent change in symptoms over the last several years.  Plan: Counsel to quit smoking Continue Spiriva Continue Symbicort

## 2017-02-01 NOTE — Patient Instructions (Signed)
We will arrange for a PET scan and call you with those results We will likely need to perform a bronchoscopy, we will make arrangements for this after we have seen the PET scan I recommend that you stop smoking Follow-up 4-6 weeks or sooner if needed

## 2017-02-03 ENCOUNTER — Encounter (HOSPITAL_COMMUNITY)
Admission: RE | Admit: 2017-02-03 | Discharge: 2017-02-03 | Disposition: A | Discharge status: Home / Self Care | Payer: Medicaid Other | Source: Ambulatory Visit | Attending: Internal Medicine | Admitting: Internal Medicine

## 2017-02-03 ENCOUNTER — Encounter (HOSPITAL_COMMUNITY): Payer: Self-pay

## 2017-02-03 DIAGNOSIS — Z01818 Encounter for other preprocedural examination: Secondary | ICD-10-CM | POA: Diagnosis not present

## 2017-02-03 LAB — BASIC METABOLIC PANEL
Anion gap: 9 (ref 5–15)
BUN: 9 mg/dL (ref 6–20)
CALCIUM: 8.2 mg/dL — AB (ref 8.9–10.3)
CO2: 32 mmol/L (ref 22–32)
Chloride: 93 mmol/L — ABNORMAL LOW (ref 101–111)
Creatinine, Ser: 0.84 mg/dL (ref 0.44–1.00)
GFR calc Af Amer: >60 mL/min (ref >60)
GLUCOSE: 102 mg/dL — AB (ref 65–99)
POTASSIUM: 3.7 mmol/L (ref 3.5–5.1)
SODIUM: 134 mmol/L — AB (ref 135–145)

## 2017-02-03 LAB — CBC
HCT: 41.4 % (ref 36.0–46.0)
Hemoglobin: 13 g/dL (ref 12.0–15.0)
MCH: 27.4 pg (ref 26.0–34.0)
MCHC: 31.4 g/dL (ref 30.0–36.0)
MCV: 87.2 fL (ref 78.0–100.0)
PLATELETS: 233 10*3/uL (ref 150–400)
RBC: 4.75 MIL/uL (ref 3.87–5.11)
RDW: 15.6 % — AB (ref 11.5–15.5)
WBC: 9.5 10*3/uL (ref 4.0–10.5)

## 2017-02-07 ENCOUNTER — Telehealth: Payer: Self-pay | Admitting: Internal Medicine

## 2017-02-07 ENCOUNTER — Other Ambulatory Visit (HOSPITAL_COMMUNITY): Payer: Medicaid Other

## 2017-02-07 NOTE — Telephone Encounter (Signed)
Pt is aware that she will have to come back into the office to reschedule her TCS

## 2017-02-07 NOTE — Telephone Encounter (Signed)
Pt called to say that she isn't feeling well and can't get out of bed and tomorrow won't be any better. She said to cancel her procedure for tomorrow with RMR.

## 2017-02-08 ENCOUNTER — Ambulatory Visit (HOSPITAL_COMMUNITY): Admission: RE | Admit: 2017-02-08 | Payer: Medicaid Other | Source: Ambulatory Visit | Admitting: Internal Medicine

## 2017-02-08 ENCOUNTER — Encounter (HOSPITAL_COMMUNITY): Admission: RE | Payer: Self-pay | Source: Ambulatory Visit

## 2017-02-08 SURGERY — COLONOSCOPY WITH PROPOFOL
Anesthesia: Monitor Anesthesia Care

## 2017-02-08 NOTE — Telephone Encounter (Signed)
noted 

## 2017-02-08 NOTE — Progress Notes (Signed)
Patient has appt on 02/16/17 to review scan

## 2017-02-11 ENCOUNTER — Ambulatory Visit (INDEPENDENT_AMBULATORY_CARE_PROVIDER_SITE_OTHER): Payer: Medicaid Other | Admitting: Specialist

## 2017-02-11 ENCOUNTER — Encounter (HOSPITAL_COMMUNITY)
Admission: RE | Admit: 2017-02-11 | Discharge: 2017-02-11 | Disposition: A | Discharge status: Home / Self Care | Payer: Medicaid Other | Source: Ambulatory Visit | Attending: Pulmonary Disease | Admitting: Pulmonary Disease

## 2017-02-11 DIAGNOSIS — R918 Other nonspecific abnormal finding of lung field: Secondary | ICD-10-CM

## 2017-02-11 LAB — GLUCOSE, CAPILLARY
GLUCOSE-CAPILLARY: 34 mg/dL — AB (ref 65–99)
GLUCOSE-CAPILLARY: 77 mg/dL (ref 65–99)

## 2017-02-11 MED ORDER — FLUDEOXYGLUCOSE F - 18 (FDG) INJECTION
8.7000 | Freq: Once | INTRAVENOUS | Status: AC | PRN
  Administered 2017-02-11: 8.7 via INTRAVENOUS

## 2017-02-16 ENCOUNTER — Encounter (INDEPENDENT_AMBULATORY_CARE_PROVIDER_SITE_OTHER): Payer: Self-pay | Admitting: Specialist

## 2017-02-16 ENCOUNTER — Telehealth: Payer: Self-pay | Admitting: Pulmonary Disease

## 2017-02-16 ENCOUNTER — Ambulatory Visit (INDEPENDENT_AMBULATORY_CARE_PROVIDER_SITE_OTHER): Payer: Medicaid Other | Admitting: Specialist

## 2017-02-16 VITALS — BP 130/86 | HR 96 | Ht 60.0 in | Wt 174.0 lb

## 2017-02-16 DIAGNOSIS — N6310 Unspecified lump in the right breast, unspecified quadrant: Secondary | ICD-10-CM

## 2017-02-16 DIAGNOSIS — J84115 Respiratory bronchiolitis interstitial lung disease: Secondary | ICD-10-CM

## 2017-02-16 DIAGNOSIS — M48062 Spinal stenosis, lumbar region with neurogenic claudication: Secondary | ICD-10-CM | POA: Diagnosis not present

## 2017-02-16 DIAGNOSIS — M5136 Other intervertebral disc degeneration, lumbar region: Secondary | ICD-10-CM

## 2017-02-16 DIAGNOSIS — R918 Other nonspecific abnormal finding of lung field: Secondary | ICD-10-CM

## 2017-02-16 DIAGNOSIS — M4316 Spondylolisthesis, lumbar region: Secondary | ICD-10-CM

## 2017-02-16 NOTE — Patient Instructions (Addendum)
Avoid bending, stooping and avoid lifting weights greater than 10 lbs. Avoid prolong standing and walking. Avoid frequent bending and stooping  No lifting greater than 10 lbs. May use ice or moist heat for pain. Weight loss is of benefit. Handicap license is approved. Therapy will be scheduled. Will plan to treat conservatively as her lung mass dictates need for more urgent treatment, her lumbar condition is less Urgent.

## 2017-02-16 NOTE — Telephone Encounter (Signed)
Pt called back, aware of BQ's recs.  Pt wishes to have bronch done on 7/5 at [facility].  I cannot call to schedule time today d/t respiratory closing at 4:30.   Will route to myself to schedule tomorrow AM- will call pt tomorrow to make aware of time for bronch

## 2017-02-16 NOTE — Progress Notes (Signed)
Office Visit Note   Patient: Sydney Blair           Date of Birth: 10-27-1955           MRN: [id number] Visit Date: 02/16/2017              Requested by: [name], MD [address] [place], [place] [address] PCP: [name], MD   Assessment & Plan: Visit Diagnoses:  1. Spinal stenosis of lumbar region with neurogenic claudication   2. Spondylolisthesis, lumbar region   3. Degenerative disc disease, lumbar   4. 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 Lung mass   6. Breast mass, right     Plan: Avoid bending, stooping and avoid lifting weights greater than 10 lbs. Avoid prolong standing and walking. Avoid frequent bending and stooping  No lifting greater than 10 lbs. May use ice or moist heat for pain. Weight loss is of benefit. Therapy will be scheduled. Will plan to treat conservatively as her lung mass dictates need for more urgent treatment, her lumbar condition is less Urgent. Handicap license is approved.  Follow-Up Instructions: No Follow-up on file.   Orders:  No orders of the defined types were placed in this encounter.  No orders of the defined types were placed in this encounter.     Procedures: No procedures performed   Clinical Data: No additional findings.   Subjective: Chief Complaint  Patient presents with  . Lower Back - Follow-up    MRI Review--Lumbar    61 year old female with history of back pain and radiation into both legs, no bowel or bladder difficulties. She is limited in her standing only 10-15 minutes and can only make it to her driveway with leg weakness and hurt and  And feelings of heaviness. She has been having difficulty with her back for years. Underwent MRI, her last lumbar MRI was in 03/2013. Recently found to have a lung mass, this is undergoing work up and she can not consider surgical intervention.     Review of Systems  Constitutional: Negative.   HENT: Negative.     Eyes: Negative.   Respiratory: Negative.   Cardiovascular: Negative.   Gastrointestinal: Negative.   Endocrine: Negative.   Genitourinary: Negative.   Musculoskeletal: Negative.   Skin: Negative.   Allergic/Immunologic: Negative.   Neurological: Negative.   Hematological: Negative.   Psychiatric/Behavioral: Negative.      Objective: Vital Signs: BP 130/86 (BP Location: Left Arm, Patient Position: Sitting)   Pulse 96   Ht 5' (1.524 m)   Wt 174 lb (78.9 kg)   BMI 33.98 kg/m   Physical Exam  Constitutional: She is oriented to person, place, and time. She appears well-developed and well-nourished.  HENT:  Head: Normocephalic and atraumatic.  Eyes: EOM are normal. Pupils are equal, round, and reactive to light.  Neck: Normal range of motion. Neck supple.  Pulmonary/Chest: Effort normal and breath sounds normal.  Abdominal: Soft. Bowel sounds are normal.  Neurological: She is alert and oriented to person, place, and time.  Skin: Skin is warm and dry.  Psychiatric: She has a normal mood and affect. Her behavior is normal. Judgment and thought content normal.    Back Exam   Tenderness  The patient is experiencing tenderness in the lumbar.  Range of Motion  Extension: abnormal  Flexion: normal  Lateral Bend Right: abnormal  Lateral Bend Left: abnormal  Rotation Right: abnormal  Rotation Left: abnormal   Muscle  Strength  Right Quadriceps:  5/5  Left Quadriceps:  5/5  Right Hamstrings:  5/5  Left Hamstrings:  5/5   Tests  Straight leg raise right: negative Straight leg raise left: negative  Reflexes  Patellar: normal Achilles: normal Biceps: normal Babinski's sign: normal   Other  Toe Walk: abnormal Heel Walk: abnormal Sensation: normal Gait: normal  Erythema: no back redness Scars: absent      Specialty Comments:  No specialty comments available.  Imaging: No results found.   PMFS History: Patient Active Problem List   Diagnosis Date Noted   . Lung mass 02/01/2017  . Breast mass, right   . Hx of adenomatous colonic polyps 12/28/2016  . Fracture of fifth metatarsal bone of right foot with delayed healing 02/03/2016  . IBS (irritable bowel syndrome) 07/30/2015  . Diarrhea 10/29/2014  . Hoarseness 10/29/2014  . Chronic hoarseness 01/16/2014  . Other dysphagia 01/16/2014  . Dysphagia, unspecified(787.20) 06/04/2013  . Unspecified constipation 06/04/2013  . Unsteady gait 01/25/2013  . COPD (chronic obstructive pulmonary disease) with emphysema ([place]) 12/26/2012  . DOE (dyspnea on exertion) 12/01/2012  . 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 Dysphagia 11/09/2012  . COPD exacerbation ([place]) 08/07/2011  . Hypokalemia 08/07/2011  . Myositis 08/07/2011  . Dehydration 08/07/2011  . ARF (acute renal failure) ([place]) 08/07/2011  . Tubular adenoma 07/20/2011  . INSOMNIA 02/26/2008  . CERUMEN IMPACTION, RIGHT 01/23/2008  . 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 NECK PAIN 11/22/2007  . HAMMER TOE 11/22/2007  . ANXIETY 01/30/2007  . TOBACCO ABUSE 01/30/2007  . DEPRESSION 01/30/2007  . GERD 01/30/2007  . PROLAPSE, VAGINAL WALL, CYSTOCELE, MIDLINE 01/10/2007  . INCONTINENCE 01/10/2007  . SEBACEOUS CYST 08/17/2006  . ADENOCARCINOMA, BREAST, RIGHT 12/01/2003   Past Medical History:  Diagnosis Date  . Anxiety   . ARF (acute renal failure) ([place]) 08/07/2011  . Cancer ([place]) 2005   breast-right  . Chronic back pain   . COPD (chronic obstructive pulmonary disease) ([place])   . Depression   . Depression with anxiety   . Fibromyalgia   . GERD (gastroesophageal reflux disease)   . Hypercholesteremia   . Hypothyroidism   . Incontinence   . Neuropathy   . Peripheral neuropathy   . Sciatic pain   . Sebaceous cyst    lt labial location  . Sleep apnea    uses CPAP  . Tobacco abuse     Family History  Problem Relation Age of Onset  . Cancer Other   . Heart attack Other   . Lung  disease Other   . Emphysema Mother     Past Surgical History:  Procedure Laterality Date  . bladder tack    . BREAST BIOPSY Right 12/29/2016   Procedure: RIGHT BREAST BIOPSY;  Surgeon: Aviva Signs, MD;  Location: [facility];  Service: General;  Laterality: Right;  . BREAST LUMPECTOMY     right with node dissection  . COLONOSCOPY  11/11/2010   XTK:WIOXBDZHGD rectal polyp and splenic flexure otherwise normal; pathology showed  tubular adenomas. Next TCS 10/2015.  Marland Kitchen ESOPHAGOGASTRODUODENOSCOPY  11/11/2010   JME:QASTM hiatal hernia, 69 French dilator  . ESOPHAGOGASTRODUODENOSCOPY (EGD) WITH ESOPHAGEAL DILATION N/A 06/21/2013   HDQ:QIWLNL esophagus  - status post Maloney dilation s/p bx (benign esophageal bx)   Social History   Occupational History  . disabled Unemployed   Social History Main Topics  . Smoking status: Current Every Day Smoker    Packs/day: 1.50    Years: 35.00  Types: Cigarettes  . Smokeless tobacco: Never Used     Comment: has cut back to one pack daily  . Alcohol use No  . Drug use: No  . Sexual activity: Yes    Birth control/ protection: Post-menopausal     Comment: chemo stopped her periods

## 2017-02-16 NOTE — Telephone Encounter (Signed)
I tried calling Mrs. Sydney Blair to go over the results of her PET scan today but I had to leave a message. She needs to have a bronchoscopy with endobronchial ultrasound as soon as possible. I will ask our office staff to make arrangements.

## 2017-02-16 NOTE — Telephone Encounter (Signed)
Per BQ, EBUS can be done either tomorrow at [facility]  At 7:30, Friday morning (before 12:00) at [facility], or next Thursday am at [facility].   BQ just left a message for this pt- will cb after lunch today.

## 2017-02-16 NOTE — Telephone Encounter (Signed)
lmtcb X2 for pt.  

## 2017-02-17 NOTE — Telephone Encounter (Signed)
[facility] has scheduled this EBUS for 02/24/2017 at [facility] at 7:30.  Pt is aware of date/time, and questions regarding procedure were answered to the best of my ability.  Pt aware that respiratory team from hospital will be calling her as well to answer further questions.  Forwarding to BQ as FYI for date/time of procedure.  Nothing further needed.

## 2017-02-17 NOTE — Telephone Encounter (Signed)
noted 

## 2017-02-17 NOTE — Addendum Note (Signed)
Addended by: Len Blalock on: 02/17/2017 10:57 AM   Modules accepted: Orders

## 2017-02-17 NOTE — Telephone Encounter (Signed)
lmtcb for Sydney Blair at respiratory to schedule procedure.

## 2017-02-21 ENCOUNTER — Telehealth: Payer: Self-pay | Admitting: Pulmonary Disease

## 2017-02-21 NOTE — Telephone Encounter (Signed)
Spoke with pt. She had some questions about what time and where to go for her EBUS. Pt's questions were answered. Nothing further was needed.

## 2017-02-22 ENCOUNTER — Encounter (HOSPITAL_COMMUNITY): Payer: Self-pay | Admitting: *Deleted

## 2017-02-22 ENCOUNTER — Telehealth: Payer: Self-pay | Admitting: Pulmonary Disease

## 2017-02-22 ENCOUNTER — Ambulatory Visit (HOSPITAL_COMMUNITY): Payer: Medicaid Other | Admitting: Certified Registered Nurse Anesthetist

## 2017-02-22 NOTE — Telephone Encounter (Signed)
Spoke with Sydney Blair. She stated that they needed orders for patient's bronchoscopy scheduled for Thursday. Advised her that BQ is working nights this week but we will route the message to him.    BQ, please advise. Thanks.

## 2017-02-23 ENCOUNTER — Other Ambulatory Visit: Payer: Self-pay | Admitting: Pulmonary Disease

## 2017-02-23 DIAGNOSIS — R918 Other nonspecific abnormal finding of lung field: Secondary | ICD-10-CM

## 2017-02-23 NOTE — Progress Notes (Signed)
Orders entered for case request for OR 7/5: lung mass, mediastinal lymphadenopathy

## 2017-02-24 ENCOUNTER — Other Ambulatory Visit: Payer: Self-pay | Admitting: Pulmonary Disease

## 2017-02-24 ENCOUNTER — Other Ambulatory Visit: Payer: Self-pay

## 2017-02-24 ENCOUNTER — Ambulatory Visit (HOSPITAL_COMMUNITY)
Admission: RE | Admit: 2017-02-24 | Discharge: 2017-02-24 | Disposition: A | Discharge status: Home / Self Care | Payer: Medicaid Other | Source: Ambulatory Visit | Attending: Pulmonary Disease | Admitting: Pulmonary Disease

## 2017-02-24 ENCOUNTER — Encounter (HOSPITAL_COMMUNITY): Payer: Self-pay | Admitting: Certified Registered Nurse Anesthetist

## 2017-02-24 ENCOUNTER — Encounter (HOSPITAL_COMMUNITY)
Admission: RE | Disposition: A | Discharge status: Home / Self Care | Payer: Self-pay | Source: Ambulatory Visit | Attending: Pulmonary Disease

## 2017-02-24 DIAGNOSIS — R918 Other nonspecific abnormal finding of lung field: Secondary | ICD-10-CM | POA: Diagnosis present

## 2017-02-24 DIAGNOSIS — G4730 Sleep apnea, unspecified: Secondary | ICD-10-CM | POA: Diagnosis not present

## 2017-02-24 DIAGNOSIS — F17200 Nicotine dependence, unspecified, uncomplicated: Secondary | ICD-10-CM | POA: Diagnosis not present

## 2017-02-24 DIAGNOSIS — J449 Chronic obstructive pulmonary disease, unspecified: Secondary | ICD-10-CM | POA: Insufficient documentation

## 2017-02-24 DIAGNOSIS — Z538 Procedure and treatment not carried out for other reasons: Secondary | ICD-10-CM | POA: Diagnosis not present

## 2017-02-24 DIAGNOSIS — M1990 Unspecified osteoarthritis, unspecified site: Secondary | ICD-10-CM | POA: Diagnosis not present

## 2017-02-24 DIAGNOSIS — Z01812 Encounter for preprocedural laboratory examination: Secondary | ICD-10-CM

## 2017-02-24 DIAGNOSIS — R591 Generalized enlarged lymph nodes: Secondary | ICD-10-CM | POA: Diagnosis not present

## 2017-02-24 DIAGNOSIS — K219 Gastro-esophageal reflux disease without esophagitis: Secondary | ICD-10-CM | POA: Insufficient documentation

## 2017-02-24 HISTORY — DX: Unspecified osteoarthritis, unspecified site: M19.90

## 2017-02-24 LAB — CBC
HCT: 42.7 % (ref 36.0–46.0)
Hemoglobin: 13.1 g/dL (ref 12.0–15.0)
MCH: 26.6 pg (ref 26.0–34.0)
MCHC: 30.7 g/dL (ref 30.0–36.0)
MCV: 86.6 fL (ref 78.0–100.0)
PLATELETS: 241 10*3/uL (ref 150–400)
RBC: 4.93 MIL/uL (ref 3.87–5.11)
RDW: 15.6 % — ABNORMAL HIGH (ref 11.5–15.5)
WBC: 8.6 10*3/uL (ref 4.0–10.5)

## 2017-02-24 LAB — BASIC METABOLIC PANEL
Anion gap: 9 (ref 5–15)
BUN: 5 mg/dL — ABNORMAL LOW (ref 6–20)
CALCIUM: 8.4 mg/dL — AB (ref 8.9–10.3)
CO2: 31 mmol/L (ref 22–32)
CREATININE: 0.83 mg/dL (ref 0.44–1.00)
Chloride: 96 mmol/L — ABNORMAL LOW (ref 101–111)
GLUCOSE: 100 mg/dL — AB (ref 65–99)
Potassium: 2.5 mmol/L — CL (ref 3.5–5.1)
Sodium: 136 mmol/L (ref 135–145)

## 2017-02-24 SURGERY — BRONCHOSCOPY, WITH EBUS
Anesthesia: General

## 2017-02-24 MED ORDER — POTASSIUM CHLORIDE ER 20 MEQ PO TBCR: 40.0000 meq | EXTENDED_RELEASE_TABLET | Freq: Every day | ORAL | 0 refills | Status: DC

## 2017-02-24 MED ORDER — LIDOCAINE HCL 2 % EX GEL
1.0000 "application " | Freq: Once | CUTANEOUS | Status: DC
  Filled 2017-02-24: qty 5

## 2017-02-24 MED ORDER — PHENYLEPHRINE HCL 0.25 % NA SOLN
1.0000 | Freq: Four times a day (QID) | NASAL | Status: DC | PRN
  Filled 2017-02-24: qty 15

## 2017-02-24 MED ORDER — FENTANYL CITRATE (PF) 250 MCG/5ML IJ SOLN
INTRAMUSCULAR | Status: AC
  Filled 2017-02-24: qty 5

## 2017-02-24 MED ORDER — POTASSIUM CHLORIDE CRYS ER 20 MEQ PO TBCR
60.0000 meq | EXTENDED_RELEASE_TABLET | Freq: Once | ORAL | Status: AC
  Administered 2017-02-24: 60 meq via ORAL
  Filled 2017-02-24: qty 3

## 2017-02-24 MED ORDER — PROPOFOL 10 MG/ML IV BOLUS
INTRAVENOUS | Status: AC
  Filled 2017-02-24: qty 40

## 2017-02-24 MED ORDER — MIDAZOLAM HCL 2 MG/2ML IJ SOLN
INTRAMUSCULAR | Status: AC
  Filled 2017-02-24: qty 2

## 2017-02-24 NOTE — Anesthesia Preprocedure Evaluation (Signed)
Anesthesia Evaluation  Patient identified by MRN, date of birth, ID band Patient awake    Reviewed: Allergy & Precautions, NPO status , Patient's Chart, lab work & pertinent test results  Airway        Dental   Pulmonary sleep apnea , COPD, Current Smoker,           Cardiovascular + DOE   Rhythm:Regular Rate:Normal     Neuro/Psych  Neuromuscular disease    GI/Hepatic GERD  ,  Endo/Other    Renal/GU Renal InsufficiencyRenal disease     Musculoskeletal  (+) Arthritis ,   Abdominal   Peds  Hematology   Anesthesia Other Findings   Reproductive/Obstetrics                             Anesthesia Physical Anesthesia Plan  ASA: III  Anesthesia Plan: General   Post-op Pain Management:    Induction: Intravenous  PONV Risk Score and Plan: 3 and Ondansetron, Dexamethasone, Propofol and Midazolam  Airway Management Planned: Oral ETT  Additional Equipment:   Intra-op Plan:   Post-operative Plan: Extubation in OR  Informed Consent: I have reviewed the patients History and Physical, chart, labs and discussed the procedure including the risks, benefits and alternatives for the proposed anesthesia with the patient or authorized representative who has indicated his/her understanding and acceptance.     Plan Discussed with:   Anesthesia Plan Comments:         Anesthesia Quick Evaluation

## 2017-02-24 NOTE — Progress Notes (Signed)
Patient's surgery will be cancelled and the patient will be given Po potassium per Dr. [place] Bells

## 2017-02-24 NOTE — Telephone Encounter (Signed)
Orders were written yesterday

## 2017-02-24 NOTE — Telephone Encounter (Signed)
BQ - please advise. Thanks. 

## 2017-02-24 NOTE — Progress Notes (Signed)
CRITICAL VALUE ALERT  Critical Value:  K 2.5  Date & Time Notied:  6:53 AM 02/24/17   Provider Notified: Dr. Orene Desanctis  Orders Received/Actions taken: he is going to see patient

## 2017-02-24 NOTE — Progress Notes (Signed)
EBUS case canceled for very low potassium (2.5).  Labs reviewed, value seems accurate and she reports having a history of low potassium in the past.  She doesn't take potassium supplementation.  Will give 60 mEq orally here, and have her day another 40 mEq at home and then daily after that.  Repeat BMET on Monday and reschedule case for next week.  Will cc my office staff to order the BMET for Monday and coordinate an EBUS in the OR at [facility] next week.  I can do Mon, Tues or Wed.    Roselie Awkward, MD [place] [facility] Pager: [phone] After 3pm or if no response, call [phone]

## 2017-02-24 NOTE — Telephone Encounter (Signed)
From further inspection of pt's chart, her EBUS was canceled today due to a low K+ level. Per triage protocol, message will be closed.

## 2017-02-25 ENCOUNTER — Other Ambulatory Visit (INDEPENDENT_AMBULATORY_CARE_PROVIDER_SITE_OTHER): Payer: Medicaid Other

## 2017-02-25 ENCOUNTER — Telehealth: Payer: Self-pay | Admitting: Pulmonary Disease

## 2017-02-25 DIAGNOSIS — Z01812 Encounter for preprocedural laboratory examination: Secondary | ICD-10-CM

## 2017-02-25 DIAGNOSIS — R918 Other nonspecific abnormal finding of lung field: Secondary | ICD-10-CM

## 2017-02-25 LAB — BASIC METABOLIC PANEL
BUN: 7 mg/dL (ref 6–23)
CHLORIDE: 99 meq/L (ref 96–112)
CO2: 36 mEq/L — ABNORMAL HIGH (ref 19–32)
Calcium: 8.9 mg/dL (ref 8.4–10.5)
Creatinine, Ser: 0.85 mg/dL (ref 0.40–1.20)
GFR: 72.65 mL/min (ref >60.00)
Glucose, Bld: 121 mg/dL — ABNORMAL HIGH (ref 70–99)
POTASSIUM: 4.4 meq/L (ref 3.5–5.1)
SODIUM: 140 meq/L (ref 135–145)

## 2017-02-25 LAB — CBC WITH DIFFERENTIAL/PLATELET
BASOS ABS: 0.1 10*3/uL (ref 0.0–0.1)
BASOS PCT: 0.6 % (ref 0.0–3.0)
EOS ABS: 0.2 10*3/uL (ref 0.0–0.7)
Eosinophils Relative: 2.3 % (ref 0.0–5.0)
HCT: 43.3 % (ref 36.0–46.0)
Hemoglobin: 14 g/dL (ref 12.0–15.0)
LYMPHS PCT: 15.9 % (ref 12.0–46.0)
Lymphs Abs: 1.8 10*3/uL (ref 0.7–4.0)
MCHC: 32.3 g/dL (ref 30.0–36.0)
MCV: 83.7 fl (ref 78.0–100.0)
MONO ABS: 0.7 10*3/uL (ref 0.1–1.0)
Monocytes Relative: 6.2 % (ref 3.0–12.0)
NEUTROS ABS: 8.3 10*3/uL — AB (ref 1.4–7.7)
NEUTROS PCT: 75 % (ref 43.0–77.0)
PLATELETS: 293 10*3/uL (ref 150.0–400.0)
RBC: 5.17 Mil/uL — ABNORMAL HIGH (ref 3.87–5.11)
RDW: 16.6 % — AB (ref 11.5–15.5)
WBC: 11.1 10*3/uL — AB (ref 4.0–10.5)

## 2017-02-25 LAB — HEMOGLOBIN A1C
HEMOGLOBIN A1C: 5.4 % (ref 4.8–5.6)
Mean Plasma Glucose: 108 mg/dL

## 2017-02-25 NOTE — Telephone Encounter (Signed)
Spoke with Minna Merritts. She stated that the patient is scheduled for a bronchoscopy on 02/28/17 with BQ and they need orders for this procedure.   BQ, please advise. Thanks!

## 2017-02-28 ENCOUNTER — Ambulatory Visit (HOSPITAL_COMMUNITY): Payer: Medicaid Other | Admitting: Anesthesiology

## 2017-02-28 ENCOUNTER — Ambulatory Visit (HOSPITAL_COMMUNITY)
Admission: RE | Admit: 2017-02-28 | Discharge: 2017-02-28 | Disposition: A | Discharge status: Home / Self Care | Payer: Medicaid Other | Source: Ambulatory Visit | Attending: Pulmonary Disease | Admitting: Pulmonary Disease

## 2017-02-28 ENCOUNTER — Ambulatory Visit (HOSPITAL_COMMUNITY): Payer: Medicaid Other | Admitting: Physical Therapy

## 2017-02-28 ENCOUNTER — Encounter (HOSPITAL_COMMUNITY)
Admission: RE | Disposition: A | Discharge status: Home / Self Care | Payer: Self-pay | Source: Ambulatory Visit | Attending: Pulmonary Disease

## 2017-02-28 ENCOUNTER — Encounter (HOSPITAL_COMMUNITY): Payer: Self-pay | Admitting: *Deleted

## 2017-02-28 DIAGNOSIS — K219 Gastro-esophageal reflux disease without esophagitis: Secondary | ICD-10-CM | POA: Insufficient documentation

## 2017-02-28 DIAGNOSIS — F17210 Nicotine dependence, cigarettes, uncomplicated: Secondary | ICD-10-CM | POA: Diagnosis not present

## 2017-02-28 DIAGNOSIS — M1990 Unspecified osteoarthritis, unspecified site: Secondary | ICD-10-CM | POA: Diagnosis not present

## 2017-02-28 DIAGNOSIS — R918 Other nonspecific abnormal finding of lung field: Secondary | ICD-10-CM | POA: Diagnosis not present

## 2017-02-28 DIAGNOSIS — J449 Chronic obstructive pulmonary disease, unspecified: Secondary | ICD-10-CM | POA: Insufficient documentation

## 2017-02-28 DIAGNOSIS — E7800 Pure hypercholesterolemia, unspecified: Secondary | ICD-10-CM | POA: Diagnosis not present

## 2017-02-28 DIAGNOSIS — G8929 Other chronic pain: Secondary | ICD-10-CM | POA: Diagnosis not present

## 2017-02-28 DIAGNOSIS — E039 Hypothyroidism, unspecified: Secondary | ICD-10-CM | POA: Insufficient documentation

## 2017-02-28 DIAGNOSIS — M797 Fibromyalgia: Secondary | ICD-10-CM | POA: Insufficient documentation

## 2017-02-28 DIAGNOSIS — C3490 Malignant neoplasm of unspecified part of unspecified bronchus or lung: Secondary | ICD-10-CM | POA: Insufficient documentation

## 2017-02-28 DIAGNOSIS — G629 Polyneuropathy, unspecified: Secondary | ICD-10-CM | POA: Diagnosis not present

## 2017-02-28 DIAGNOSIS — F17200 Nicotine dependence, unspecified, uncomplicated: Secondary | ICD-10-CM

## 2017-02-28 DIAGNOSIS — Z880 Allergy status to penicillin: Secondary | ICD-10-CM | POA: Diagnosis not present

## 2017-02-28 DIAGNOSIS — F418 Other specified anxiety disorders: Secondary | ICD-10-CM | POA: Insufficient documentation

## 2017-02-28 DIAGNOSIS — G4730 Sleep apnea, unspecified: Secondary | ICD-10-CM | POA: Diagnosis not present

## 2017-02-28 HISTORY — PX: VIDEO BRONCHOSCOPY WITH ENDOBRONCHIAL ULTRASOUND: SHX6177

## 2017-02-28 HISTORY — DX: Dyspnea, unspecified: R06.00

## 2017-02-28 SURGERY — BRONCHOSCOPY, WITH EBUS
Anesthesia: General

## 2017-02-28 MED ORDER — ONDANSETRON HCL 4 MG/2ML IJ SOLN
INTRAMUSCULAR | Status: AC
  Filled 2017-02-28: qty 2

## 2017-02-28 MED ORDER — MIDAZOLAM HCL 2 MG/2ML IJ SOLN
INTRAMUSCULAR | Status: AC
  Filled 2017-02-28: qty 2

## 2017-02-28 MED ORDER — ONDANSETRON HCL 4 MG/2ML IJ SOLN
INTRAMUSCULAR | Status: DC | PRN
  Administered 2017-02-28: 4 mg via INTRAVENOUS

## 2017-02-28 MED ORDER — MIDAZOLAM HCL 5 MG/5ML IJ SOLN
INTRAMUSCULAR | Status: DC | PRN
  Administered 2017-02-28: 1 mg via INTRAVENOUS

## 2017-02-28 MED ORDER — LACTATED RINGERS IV SOLN
INTRAVENOUS | Status: DC
  Administered 2017-02-28: 08:00:00 via INTRAVENOUS

## 2017-02-28 MED ORDER — LIDOCAINE HCL (CARDIAC) 20 MG/ML IV SOLN
INTRAVENOUS | Status: DC | PRN
  Administered 2017-02-28: 80 mg via INTRATRACHEAL

## 2017-02-28 MED ORDER — LIDOCAINE 2% (20 MG/ML) 5 ML SYRINGE
INTRAMUSCULAR | Status: DC | PRN
  Administered 2017-02-28: 100 mg via INTRAVENOUS

## 2017-02-28 MED ORDER — PHENYLEPHRINE 40 MCG/ML (10ML) SYRINGE FOR IV PUSH (FOR BLOOD PRESSURE SUPPORT)
PREFILLED_SYRINGE | INTRAVENOUS | Status: DC | PRN
  Administered 2017-02-28: 40 ug via INTRAVENOUS
  Administered 2017-02-28 (×2): 80 ug via INTRAVENOUS
  Administered 2017-02-28 (×5): 40 ug via INTRAVENOUS

## 2017-02-28 MED ORDER — 0.9 % SODIUM CHLORIDE (POUR BTL) OPTIME
TOPICAL | Status: DC | PRN
  Administered 2017-02-28: 1000 mL

## 2017-02-28 MED ORDER — SUGAMMADEX SODIUM 200 MG/2ML IV SOLN
INTRAVENOUS | Status: DC | PRN
  Administered 2017-02-28: 200 mg via INTRAVENOUS

## 2017-02-28 MED ORDER — PHENYLEPHRINE 40 MCG/ML (10ML) SYRINGE FOR IV PUSH (FOR BLOOD PRESSURE SUPPORT)
PREFILLED_SYRINGE | INTRAVENOUS | Status: AC
Start: 2017-02-28 — End: 2017-02-28
  Filled 2017-02-28: qty 10

## 2017-02-28 MED ORDER — SUGAMMADEX SODIUM 200 MG/2ML IV SOLN
INTRAVENOUS | Status: AC
  Filled 2017-02-28: qty 2

## 2017-02-28 MED ORDER — ROCURONIUM BROMIDE 10 MG/ML (PF) SYRINGE
PREFILLED_SYRINGE | INTRAVENOUS | Status: DC | PRN
  Administered 2017-02-28: 35 mg via INTRAVENOUS

## 2017-02-28 MED ORDER — FENTANYL CITRATE (PF) 250 MCG/5ML IJ SOLN
INTRAMUSCULAR | Status: AC
  Filled 2017-02-28: qty 5

## 2017-02-28 MED ORDER — LACTATED RINGERS IV SOLN
INTRAVENOUS | Status: DC | PRN
  Administered 2017-02-28 (×2): via INTRAVENOUS

## 2017-02-28 MED ORDER — PHENYLEPHRINE HCL 10 MG/ML IJ SOLN
INTRAMUSCULAR | Status: DC | PRN
  Administered 2017-02-28: 30 ug/min via INTRAVENOUS

## 2017-02-28 MED ORDER — FENTANYL CITRATE (PF) 100 MCG/2ML IJ SOLN
INTRAMUSCULAR | Status: DC | PRN
  Administered 2017-02-28: 75 ug via INTRAVENOUS
  Administered 2017-02-28 (×3): 25 ug via INTRAVENOUS

## 2017-02-28 MED ORDER — PROPOFOL 10 MG/ML IV BOLUS
INTRAVENOUS | Status: DC | PRN
  Administered 2017-02-28: 30 mg via INTRAVENOUS
  Administered 2017-02-28: 170 mg via INTRAVENOUS

## 2017-02-28 MED ORDER — PROPOFOL 10 MG/ML IV BOLUS
INTRAVENOUS | Status: AC
  Filled 2017-02-28: qty 40

## 2017-02-28 SURGICAL SUPPLY — 30 items
BRUSH CYTOL CELLEBRITY 1.5X140 (MISCELLANEOUS) IMPLANT
CANISTER SUCT 3000ML PPV (MISCELLANEOUS) ×3 IMPLANT
CONT SPEC 4OZ CLIKSEAL STRL BL (MISCELLANEOUS) ×5 IMPLANT
COVER BACK TABLE 60X90IN (DRAPES) ×3 IMPLANT
COVER DOME SNAP 22 D (MISCELLANEOUS) ×3 IMPLANT
FORCEPS BIOP RJ4 1.8 (CUTTING FORCEPS) IMPLANT
GAUZE SPONGE 4X4 12PLY STRL (GAUZE/BANDAGES/DRESSINGS) ×3 IMPLANT
GLOVE BIO SURGEON STRL SZ8 (GLOVE) ×3 IMPLANT
GLOVE SURG SS PI 7.0 STRL IVOR (GLOVE) ×2 IMPLANT
GOWN STRL REUS W/ TWL LRG LVL3 (GOWN DISPOSABLE) ×1 IMPLANT
GOWN STRL REUS W/ TWL XL LVL3 (GOWN DISPOSABLE) IMPLANT
GOWN STRL REUS W/TWL LRG LVL3 (GOWN DISPOSABLE) ×3
GOWN STRL REUS W/TWL XL LVL3 (GOWN DISPOSABLE) ×3
KIT CLEAN ENDO COMPLIANCE (KITS) ×6 IMPLANT
KIT ROOM TURNOVER OR (KITS) ×3 IMPLANT
MARKER SKIN DUAL TIP RULER LAB (MISCELLANEOUS) ×3 IMPLANT
NDL EBUS SONO TIP PENTAX (NEEDLE) ×1 IMPLANT
NDL ECHOTIP HI DEF 22GA (NEEDLE) IMPLANT
NEEDLE EBUS SONO TIP PENTAX (NEEDLE) IMPLANT
NEEDLE ECHOTIP HI DEF 22GA (NEEDLE) ×3 IMPLANT
NS IRRIG 1000ML POUR BTL (IV SOLUTION) ×3 IMPLANT
OIL SILICONE PENTAX (PARTS (SERVICE/REPAIRS)) ×3 IMPLANT
PAD ARMBOARD 7.5X6 YLW CONV (MISCELLANEOUS) ×6 IMPLANT
SYR 20CC LL (SYRINGE) ×3 IMPLANT
SYR 20ML ECCENTRIC (SYRINGE) ×6 IMPLANT
SYR 5ML LUER SLIP (SYRINGE) ×3 IMPLANT
TOWEL GREEN STERILE FF (TOWEL DISPOSABLE) ×2 IMPLANT
TRAP SPECIMEN MUCOUS 40CC (MISCELLANEOUS) ×2 IMPLANT
TUBE CONNECTING 20'X1/4 (TUBING) ×2
TUBE CONNECTING 20X1/4 (TUBING) ×4 IMPLANT

## 2017-02-28 NOTE — Anesthesia Procedure Notes (Signed)
Procedure Name: Intubation Date/Time: 02/28/2017 9:36 AM Performed by: Garrison Columbus T Pre-anesthesia Checklist: Patient identified, Emergency Drugs available, Suction available and Patient being monitored Patient Re-evaluated:Patient Re-evaluated prior to inductionOxygen Delivery Method: Circle System Utilized Preoxygenation: Pre-oxygenation with 100% oxygen Intubation Type: IV induction Ventilation: Mask ventilation without difficulty Laryngoscope Size: Miller and 2 Grade View: Grade I Tube type: Oral Tube size: 8.5 mm Number of attempts: 1 Airway Equipment and Method: Stylet and Oral airway Placement Confirmation: ETT inserted through vocal cords under direct vision,  positive ETCO2 and breath sounds checked- equal and bilateral Secured at: 22 cm Tube secured with: Tape Dental Injury: Teeth and Oropharynx as per pre-operative assessment  Comments: Intubation by Morton Peters

## 2017-02-28 NOTE — Transfer of Care (Signed)
Immediate Anesthesia Transfer of Care Note  Patient: Sydney Blair  Procedure(s) Performed: Procedure(s): VIDEO BRONCHOSCOPY WITH ENDOBRONCHIAL ULTRASOUND (N/A)  Patient Location: PACU  Anesthesia Type:General  Level of Consciousness: awake and alert   Airway & Oxygen Therapy: Patient Spontanous Breathing and Patient connected to face mask oxygen  Post-op Assessment: Report given to RN, Post -op Vital signs reviewed and stable and Patient moving all extremities  Post vital signs: Reviewed and stable  Last Vitals:  Vitals:   02/28/17 0759  BP: (!) 168/81  Pulse: 88  Resp: 20  Temp: 36.8 C    Last Pain:  Vitals:   02/28/17 0759  TempSrc: Oral      Patients Stated Pain Goal: 3 (53/00/51 1021)  Complications: No apparent anesthesia complications

## 2017-02-28 NOTE — Anesthesia Preprocedure Evaluation (Addendum)
Anesthesia Evaluation  Patient identified by MRN, date of birth, ID band Patient awake    Reviewed: Allergy & Precautions, NPO status , Patient's Chart, lab work & pertinent test results  Airway Mallampati: II  TM Distance: >3 FB Neck ROM: Full    Dental  (+) Edentulous Upper, Edentulous Lower, Dental Advisory Given   Pulmonary shortness of breath, sleep apnea, Continuous Positive Airway Pressure Ventilation and Oxygen sleep apnea , COPD,  COPD inhaler, Current Smoker,    + rhonchi        Cardiovascular + DOE   Rhythm:Regular Rate:Normal     Neuro/Psych    GI/Hepatic   Endo/Other  Hypothyroidism   Renal/GU Renal InsufficiencyRenal disease     Musculoskeletal  (+) Arthritis , Fibromyalgia -  Abdominal (+) + obese,   Peds  Hematology   Anesthesia Other Findings   Reproductive/Obstetrics                           Anesthesia Physical Anesthesia Plan  ASA: III  Anesthesia Plan: General   Post-op Pain Management:    Induction: Intravenous  PONV Risk Score and Plan: 3 and Ondansetron, Dexamethasone, Propofol and Midazolam  Airway Management Planned: Oral ETT  Additional Equipment:   Intra-op Plan:   Post-operative Plan: Extubation in OR and Possible Post-op intubation/ventilation  Informed Consent: I have reviewed the patients History and Physical, chart, labs and discussed the procedure including the risks, benefits and alternatives for the proposed anesthesia with the patient or authorized representative who has indicated his/her understanding and acceptance.   Dental advisory given  Plan Discussed with: CRNA  Anesthesia Plan Comments:         Anesthesia Quick Evaluation

## 2017-02-28 NOTE — Op Note (Signed)
Video Bronchoscopy with Endobronchial Ultrasound Procedure Note  Date of Operation: 02/28/2017  Pre-op Diagnosis: Lung mass, mediastinal lymphadenopathy  Post-op Diagnosis: Lung Carcinoma, uncertain tissue type  Surgeon: Roselie Awkward  Assistants: none  Anesthesia: General endotracheal anesthesia  Operation: Flexible video fiberoptic bronchoscopy with endobronchial ultrasound and biopsies.  Estimated Blood Loss: Minimal  Complications: None immediate  Indications and History: Sydney Blair is a 61 y.o. female with tobacco use and a lung mass and mediastinal lymphadenopathy.  The risks, benefits, complications, treatment options and expected outcomes were discussed with the patient.  The possibilities of pneumothorax, pneumonia, reaction to medication, pulmonary aspiration, perforation of a viscus, bleeding, failure to diagnose a condition and creating a complication requiring transfusion or operation were discussed with the patient who freely signed the consent.    Description of Procedure: The patient was examined in the preoperative area and history and data from the preprocedure consultation were reviewed. It was deemed appropriate to proceed.  The patient was taken to OR 10, identified as Sydney Blair and the procedure verified as Flexible Video Fiberoptic Bronchoscopy.  A Time Out was held and the above information confirmed. After being taken to the operating room general anesthesia was initiated and the patient  was orally intubated. The video fiberoptic bronchoscope was introduced via the endotracheal tube and a general inspection was performed which showed normal tracheobronchial anatomy.  Subsegments of the posterior segment of the right upper lobe was partially occluded but there was no clear endobronchial mass. The standard scope was then withdrawn and the endobronchial ultrasound was used to identify and characterize the peritracheal, hilar and bronchial lymph nodes.  Inspection showed enlargement of 4R and 7. Using real-time ultrasound guidance Wang needle biopsies were take from Station 4R and 7 nodes and were sent for cytology. Then the standard bronchoscope was used to inspect the airway and a BAL was performed in the posterior segement of the right upper lobe and brushings were performed in the right upper lobe.  The patient tolerated the procedure well without apparent complications. There was no significant blood loss. The bronchoscope was withdrawn. Anesthesia was reversed and the patient was taken to the PACU for recovery.   Samples: 1. Wang needle biopsies from 4R node 2. Wang needle biopsies from 7 node 3. BAL from the posterior segment of the right upper lobe 4. Endobronchial brushings from the posterior segment of the right upper lobe  Plans:  The patient will be discharged from the PACU to home when recovered from anesthesia. We will review the cytology and pathology results with the patient when they become available. Outpatient followup will be with Dr. Lake Bells.    Roselie Awkward, MD [place] [facility] Pager: [phone] Cell: [phone] After 3pm or if no response, call [phone] 02/28/2017_0 :49 AM

## 2017-02-28 NOTE — H&P (Signed)
[facility] H&P  HPI: 61 y/o female known to me from the office.  She smoked cigarettes and has a new RUL mass and mediastinal lymphadenopathy.  Here for bronchoscopy.    Past Medical History:  Diagnosis Date  . Anxiety   . ARF (acute renal failure) ([place]) 08/07/2011   ?   Marland Kitchen Arthritis   . 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 Chronic back pain   . COPD (chronic obstructive pulmonary disease) ([place])   . Depression   . Depression with anxiety   . Dyspnea    on exertion  . Fibromyalgia   . GERD (gastroesophageal reflux disease)   . Hypercholesteremia   . Hypothyroidism   . Incontinence   . Neuropathy   . Peripheral neuropathy   . Sciatic pain   . Sebaceous cyst    lt labial location  . Sleep apnea    uses CPAP  . Tobacco abuse      Family History  Problem Relation Age of Onset  . Cancer Other   . Heart attack Other   . Lung disease Other   . Emphysema Mother      Social History   Social History  . Marital status: Married    Spouse name: N/A  . Number of children: 1  . Years of education: N/A   Occupational History  . disabled Unemployed   Social History Main Topics  . Smoking status: Current Every Day Smoker    Packs/day: 1.00    Years: 35.00    Types: Cigarettes  . Smokeless tobacco: Never Used     Comment: has cut back to one pack daily  . Alcohol use No  . Drug use: No  . Sexual activity: Yes    Birth control/ protection: Post-menopausal     Comment: chemo stopped her periods   Other Topics Concern  . Not on file   Social History Narrative   Lives w. Husband   Lost 1 son-?etiology     Allergies  Allergen Reactions  . Penicillins Anaphylaxis and Other (See Comments)    Has patient had a PCN reaction causing immediate rash, facial/tongue/throat swelling, SOB or lightheadedness with hypotension:Yes Has patient had a PCN reaction causing severe rash involving mucus membranes or skin necrosis:Yes Has patient had a PCN reaction that required  hospitalization:No Has patient had a PCN reaction occurring within the last 10 years:No If all of the above answers are "NO", then may proceed with Cephalosporin use.      @encmedstart @  Vitals:   02/28/17 0759  BP: (!) 168/81  Pulse: 88  Resp: 20  Temp: 98.3 F (36.8 C)  TempSrc: Oral  SpO2: 91%   General:  Resting comfortably in bed HENT: NCAT OP clear PULM: CTA B, normal effort CV: RRR, no mgr GI: BS+, soft, nontender MSK: normal bulk and tone Neuro: awake, alert, no distress, MAEW  PET CT images reviewed: RUL mass with mediastinal lymphadenopathy  CBC    Component Value Date/Time   WBC 11.1 (H) 02/25/2017 1234   RBC 5.17 (H) 02/25/2017 1234   HGB 14.0 02/25/2017 1234   HGB 14.2 05/06/2009 1359   HCT 43.3 02/25/2017 1234   HCT 42.1 05/06/2009 1359   PLT 293.0 02/25/2017 1234   PLT 203 05/06/2009 1359   MCV 83.7 02/25/2017 1234   MCV 92.7 05/06/2009 1359   MCH 26.6 02/24/2017 0607   MCHC 32.3 02/25/2017 1234   RDW 16.6 (H) 02/25/2017 1234   RDW 13.2 05/06/2009  1359   LYMPHSABS 1.8 02/25/2017 1234   LYMPHSABS 1.9 05/06/2009 1359   MONOABS 0.7 02/25/2017 1234   MONOABS 0.4 05/06/2009 1359   EOSABS 0.2 02/25/2017 1234   EOSABS 0.1 05/06/2009 1359   BASOSABS 0.1 02/25/2017 1234   BASOSABS 0.0 05/06/2009 1359   . BMET    Component Value Date/Time   NA 140 02/25/2017 1234   K 4.4 02/25/2017 1234   CL 99 02/25/2017 1234   CO2 36 (H) 02/25/2017 1234   GLUCOSE 121 (H) 02/25/2017 1234   BUN 7 02/25/2017 1234   CREATININE 0.85 02/25/2017 1234   CALCIUM 8.9 02/25/2017 1234   GFRNONAA >60 02/24/2017 3785   GFRAA >60 02/24/2017 0607     Impression/plan:  Lung mass: very worrisome for malignancy, plan Endobronchial ultrasound FNA today under general anesthesia COPD   Roselie Awkward, MD [place] [facility] Pager: [phone] Cell: [phone] After 3pm or if no response, call [phone]

## 2017-02-28 NOTE — Anesthesia Postprocedure Evaluation (Signed)
Anesthesia Post Note  Patient: Sydney Blair  Procedure(s) Performed: Procedure(s) (LRB): VIDEO BRONCHOSCOPY WITH ENDOBRONCHIAL ULTRASOUND (N/A)     Patient location during evaluation: PACU Anesthesia Type: General Level of consciousness: awake and alert Pain management: pain level controlled Vital Signs Assessment: post-procedure vital signs reviewed and stable Respiratory status: spontaneous breathing, nonlabored ventilation, respiratory function stable and patient connected to nasal cannula oxygen Cardiovascular status: blood pressure returned to baseline and stable Postop Assessment: no signs of nausea or vomiting Anesthetic complications: no    Last Vitals:  Vitals:   02/28/17 1300 02/28/17 1315  BP: (!) 107/59   Pulse: 74 75  Resp: 20 19  Temp:      Last Pain:  Vitals:   02/28/17 1200  TempSrc:   PainSc: 0-No pain                 Shardee Dieu,JAMES TERRILL

## 2017-03-01 ENCOUNTER — Encounter (HOSPITAL_COMMUNITY): Payer: Self-pay | Admitting: Pulmonary Disease

## 2017-03-02 ENCOUNTER — Telehealth (HOSPITAL_COMMUNITY): Payer: Self-pay | Admitting: Physical Therapy

## 2017-03-02 ENCOUNTER — Telehealth: Payer: Self-pay | Admitting: Pulmonary Disease

## 2017-03-02 DIAGNOSIS — C3491 Malignant neoplasm of unspecified part of right bronchus or lung: Secondary | ICD-10-CM

## 2017-03-02 NOTE — Telephone Encounter (Signed)
Spoke with patient-aware that other part of labs(she states BQ told her about adding some) have not come back but would send to BQ as a friendly reminder that patient would like a call back asap. Thanks.

## 2017-03-02 NOTE — Telephone Encounter (Signed)
Pt has been diagnosed with cancer and wants to be called in two weeks to reschedule this appointment if needed.

## 2017-03-03 ENCOUNTER — Ambulatory Visit (HOSPITAL_COMMUNITY): Payer: Medicaid Other | Admitting: Physical Therapy

## 2017-03-03 ENCOUNTER — Telehealth: Payer: Self-pay | Admitting: *Deleted

## 2017-03-03 DIAGNOSIS — R918 Other nonspecific abnormal finding of lung field: Secondary | ICD-10-CM

## 2017-03-03 NOTE — Telephone Encounter (Signed)
Today is 7/12 and patient has had bronch and is aware of the results. Will close this message.

## 2017-03-03 NOTE — Telephone Encounter (Signed)
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 We will make a referral to thoracic oncology.

## 2017-03-03 NOTE — Telephone Encounter (Signed)
Oncology Nurse Navigator Documentation  Oncology Nurse Navigator Flowsheets 03/03/2017  Navigator Location [facility]  Referral date to RadOnc/MedOnc 03/02/2017  Navigator Encounter Type Telephone/I received a referral form Dr. Lake Bells.  I called patient and scheduled her to be seen at [facility] next week. She verbalized understanding of appt time and place.   Telephone Outgoing Call  Treatment Phase Pre-Tx/Tx Discussion  Barriers/Navigation Needs Coordination of Care  Interventions Coordination of Care  Coordination of Care Appts  Acuity Level 2  Time Spent with Patient 30

## 2017-03-04 ENCOUNTER — Encounter: Payer: Self-pay | Admitting: *Deleted

## 2017-03-04 ENCOUNTER — Telehealth: Payer: Self-pay | Admitting: Pulmonary Disease

## 2017-03-04 DIAGNOSIS — J438 Other emphysema: Secondary | ICD-10-CM

## 2017-03-04 NOTE — Telephone Encounter (Signed)
Called and spoke with pt and she stated that the tank that she has now is too heavy and she cannot carry this around.  She stated that she uses [facility] for her DME and they have a smaller POC that she would be able to handle better.  BQ please advise. Thanks

## 2017-03-09 ENCOUNTER — Institutional Professional Consult (permissible substitution): Payer: Medicaid Other | Admitting: Radiation Oncology

## 2017-03-09 NOTE — Telephone Encounter (Signed)
OK to Rx POC

## 2017-03-09 NOTE — Telephone Encounter (Signed)
BQ - please advise. Thanks. 

## 2017-03-10 ENCOUNTER — Encounter: Payer: Self-pay | Admitting: *Deleted

## 2017-03-10 ENCOUNTER — Institutional Professional Consult (permissible substitution): Payer: Medicaid Other | Admitting: Radiation Oncology

## 2017-03-10 ENCOUNTER — Ambulatory Visit (HOSPITAL_BASED_OUTPATIENT_CLINIC_OR_DEPARTMENT_OTHER): Payer: Medicaid Other | Admitting: Internal Medicine

## 2017-03-10 ENCOUNTER — Encounter (HOSPITAL_COMMUNITY): Payer: Self-pay

## 2017-03-10 ENCOUNTER — Ambulatory Visit
Admission: RE | Admit: 2017-03-10 | Discharge: 2017-03-10 | Disposition: A | Discharge status: Home / Self Care | Payer: Medicaid Other | Source: Ambulatory Visit | Attending: Radiation Oncology | Admitting: Radiation Oncology

## 2017-03-10 ENCOUNTER — Other Ambulatory Visit (HOSPITAL_BASED_OUTPATIENT_CLINIC_OR_DEPARTMENT_OTHER): Payer: Medicaid Other

## 2017-03-10 ENCOUNTER — Other Ambulatory Visit: Payer: Self-pay | Admitting: *Deleted

## 2017-03-10 ENCOUNTER — Telehealth: Payer: Self-pay | Admitting: Internal Medicine

## 2017-03-10 ENCOUNTER — Encounter: Payer: Self-pay | Admitting: Internal Medicine

## 2017-03-10 ENCOUNTER — Ambulatory Visit: Payer: Medicaid Other | Attending: Internal Medicine | Admitting: Physical Therapy

## 2017-03-10 VITALS — BP 113/94 | HR 113 | Temp 99.0°F | Resp 17 | Ht 60.0 in | Wt 172.3 lb

## 2017-03-10 DIAGNOSIS — J439 Emphysema, unspecified: Secondary | ICD-10-CM

## 2017-03-10 DIAGNOSIS — Z5111 Encounter for antineoplastic chemotherapy: Secondary | ICD-10-CM

## 2017-03-10 DIAGNOSIS — M79605 Pain in left leg: Secondary | ICD-10-CM | POA: Insufficient documentation

## 2017-03-10 DIAGNOSIS — C3411 Malignant neoplasm of upper lobe, right bronchus or lung: Secondary | ICD-10-CM | POA: Diagnosis not present

## 2017-03-10 DIAGNOSIS — F17200 Nicotine dependence, unspecified, uncomplicated: Secondary | ICD-10-CM

## 2017-03-10 DIAGNOSIS — R262 Difficulty in walking, not elsewhere classified: Secondary | ICD-10-CM | POA: Insufficient documentation

## 2017-03-10 DIAGNOSIS — C50911 Malignant neoplasm of unspecified site of right female breast: Secondary | ICD-10-CM

## 2017-03-10 DIAGNOSIS — Z7189 Other specified counseling: Secondary | ICD-10-CM

## 2017-03-10 DIAGNOSIS — G8929 Other chronic pain: Secondary | ICD-10-CM | POA: Insufficient documentation

## 2017-03-10 DIAGNOSIS — M545 Low back pain: Secondary | ICD-10-CM | POA: Diagnosis present

## 2017-03-10 DIAGNOSIS — J449 Chronic obstructive pulmonary disease, unspecified: Secondary | ICD-10-CM | POA: Diagnosis not present

## 2017-03-10 DIAGNOSIS — Z510 Encounter for antineoplastic radiation therapy: Secondary | ICD-10-CM | POA: Insufficient documentation

## 2017-03-10 DIAGNOSIS — R2681 Unsteadiness on feet: Secondary | ICD-10-CM | POA: Insufficient documentation

## 2017-03-10 DIAGNOSIS — C3491 Malignant neoplasm of unspecified part of right bronchus or lung: Secondary | ICD-10-CM | POA: Insufficient documentation

## 2017-03-10 DIAGNOSIS — M79604 Pain in right leg: Secondary | ICD-10-CM | POA: Diagnosis present

## 2017-03-10 DIAGNOSIS — R918 Other nonspecific abnormal finding of lung field: Secondary | ICD-10-CM

## 2017-03-10 HISTORY — DX: Malignant neoplasm of unspecified part of right bronchus or lung: C34.91

## 2017-03-10 LAB — COMPREHENSIVE METABOLIC PANEL
ALK PHOS: 116 U/L (ref 40–150)
ALT: 7 U/L (ref 0–55)
ANION GAP: 13 meq/L — AB (ref 3–11)
AST: 15 U/L (ref 5–34)
Albumin: 2.8 g/dL — ABNORMAL LOW (ref 3.5–5.0)
BUN: 7.6 mg/dL (ref 7.0–26.0)
CALCIUM: 9.2 mg/dL (ref 8.4–10.4)
CHLORIDE: 90 meq/L — AB (ref 98–109)
CO2: 31 mEq/L — ABNORMAL HIGH (ref 22–29)
Creatinine: 0.8 mg/dL (ref 0.6–1.1)
EGFR: 78 mL/min/{1.73_m2} — ABNORMAL LOW (ref >90)
Glucose: 90 mg/dl (ref 70–140)
POTASSIUM: 4.2 meq/L (ref 3.5–5.1)
Sodium: 135 mEq/L — ABNORMAL LOW (ref 136–145)
Total Bilirubin: 1.01 mg/dL (ref 0.20–1.20)
Total Protein: 7.4 g/dL (ref 6.4–8.3)

## 2017-03-10 LAB — CBC WITH DIFFERENTIAL/PLATELET
BASO%: 0.3 % (ref 0.0–2.0)
BASOS ABS: 0 10*3/uL (ref 0.0–0.1)
EOS ABS: 0 10*3/uL (ref 0.0–0.5)
EOS%: 0.1 % (ref 0.0–7.0)
HEMATOCRIT: 40.7 % (ref 34.8–46.6)
HGB: 12.7 g/dL (ref 11.6–15.9)
LYMPH%: 8.4 % — AB (ref 14.0–49.7)
MCH: 26.7 pg (ref 25.1–34.0)
MCHC: 31.2 g/dL — AB (ref 31.5–36.0)
MCV: 85.5 fL (ref 79.5–101.0)
MONO#: 1.2 10*3/uL — AB (ref 0.1–0.9)
MONO%: 8.8 % (ref 0.0–14.0)
NEUT#: 11.4 10*3/uL — ABNORMAL HIGH (ref 1.5–6.5)
NEUT%: 82.4 % — ABNORMAL HIGH (ref 38.4–76.8)
PLATELETS: 312 10*3/uL (ref 145–400)
RBC: 4.76 10*6/uL (ref 3.70–5.45)
RDW: 15.9 % — ABNORMAL HIGH (ref 11.2–14.5)
WBC: 13.9 10*3/uL — ABNORMAL HIGH (ref 3.9–10.3)
lymph#: 1.2 10*3/uL (ref 0.9–3.3)
nRBC: 0 % (ref 0–0)

## 2017-03-10 MED ORDER — PROCHLORPERAZINE MALEATE 10 MG PO TABS: 10.0000 mg | ORAL_TABLET | Freq: Four times a day (QID) | ORAL | 0 refills | Status: DC | PRN

## 2017-03-10 NOTE — Progress Notes (Signed)
Oncology Nurse Navigator Documentation  Oncology Nurse Navigator Flowsheets 03/10/2017  Navigator Location [facility]  Navigator Encounter Type Clinic/MDC/I spoke with patient today at thoracic clinic.  I gave and explained lung cancer, treatments, resources and support at [facility], and next steps.   Abnormal Finding Date 12/08/2016  Confirmed Diagnosis Date 02/28/2017  Multidisiplinary Clinic Date 03/10/2017  Treatment Initiated Date 03/15/2017  Patient Visit Type MedOnc  Treatment Phase Pre-Tx/Tx Discussion  Barriers/Navigation Needs Education  Education Newly Diagnosed Cancer Education;Understanding Cancer/ Treatment Options;Other  Interventions Education  Education Method Verbal;Written  Acuity Level 2  Acuity Level 2 Educational needs  Time Spent with Patient 30

## 2017-03-10 NOTE — Progress Notes (Signed)
Radiation Oncology         [phone] ________________________________  Multidisciplinary Thoracic Oncology Clinic [facility]) Initial Outpatient Consultation  Name: Sydney Blair MRN: [id number]  Date: 03/10/2017  DOB: 1955/09/25  AC:ZYSAYTK, Christean Grief, MD  Juanito Doom, MD   REFERRING PHYSICIAN: Juanito Doom, MD  DIAGNOSIS: The primary encounter diagnosis was Malignant neoplasm of right female breast, unspecified estrogen receptor status, unspecified site of breast ([place]). 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    ICD-10-CM   1. Malignant neoplasm of right female breast, unspecified estrogen receptor status, unspecified site of breast ([place]) C50.911   2. 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 female who presented with incidental finding of a lung mass on a pre operative chest x-ray for breast biopsy.  The patient was initially referred to Dr. Pennie Banter in June 2018 for abnormal chest x-ray showing a large right upper lung mass. She denied any new respiratory symptoms at that time.  PET scan on 02/11/17 revealed hypermetabolic right upper lobe mass consistent with bronchogenic carcinoma, as well as hypermetabolic right hilar and ipsilateral right paratracheal metastatic adenopathy. No contralateral nodal metastasis or supraclavicular nodal metastasis.  On 02/28/17 the patient proceeded with fine needle aspiration biopsy and bronchial lavage, which revealed malignant cells consistent with squamous cell carcinoma, as well as rare atypical cell, and lymphoid material.   The patient was referred today for presentation in the multidisciplinary conference.  Radiology studies and pathology slides were presented there for review and discussion of treatment options.   A consensus was discussed regarding potential next steps.  On review of systems, the patient denies new breathing concerns or hemoptysis. She denies headaches. She denies new onset back pain, though she reports some chronic pain. The patient uses supplemental oxygen as needed. She denies swallowing difficulty at this time.  PREVIOUS RADIATION THERAPY: Yes  2005 : Right Breast treated by Dr. Sondra Come  PAST MEDICAL HISTORY:  has a past medical history of Anxiety; ARF (acute renal failure) ([place]) (08/07/2011); Arthritis; Cancer ([place]) (2005); Chronic back pain; COPD (chronic obstructive pulmonary disease) ([place]); Depression; Depression with anxiety; Dyspnea; Fibromyalgia; GERD (gastroesophageal reflux disease); Hypercholesteremia; Hypothyroidism; Incontinence; Neuropathy; Peripheral neuropathy; Sciatic pain; Sebaceous cyst; Sleep apnea; Stage III squamous cell carcinoma of right lung ([place]) (03/10/2017); and Tobacco abuse.    PAST SURGICAL HISTORY: Past Surgical History:  Procedure Laterality Date  . bladder tack    . BREAST BIOPSY Right 12/29/2016   Procedure: RIGHT BREAST BIOPSY;  Surgeon: Aviva Signs, MD;  Location: [facility];  Service: General;  Laterality: Right;  . BREAST LUMPECTOMY     right with node dissection  . COLONOSCOPY  11/11/2010   ZSW:[id number] rectal polyp and splenic flexure otherwise normal; pathology showed  tubular adenomas. Next TCS 10/2015.  Marland Kitchen ESOPHAGOGASTRODUODENOSCOPY  11/11/2010   UKG:URKYH hiatal hernia, 71 French dilator  . ESOPHAGOGASTRODUODENOSCOPY (EGD) WITH ESOPHAGEAL DILATION N/A 06/21/2013   CWC:BJSEGB esophagus  - status post Maloney dilation s/p bx (benign esophageal bx)  . VIDEO BRONCHOSCOPY WITH ENDOBRONCHIAL ULTRASOUND N/A 02/28/2017   Procedure: VIDEO BRONCHOSCOPY WITH ENDOBRONCHIAL ULTRASOUND;  Surgeon: Juanito Doom, MD;  Location: [place];  Service: Thoracic;  Laterality: N/A;    FAMILY HISTORY: family history includes Cancer in her other; Emphysema in  her  mother; Heart attack in her other; Lung disease in her other.  SOCIAL HISTORY:  reports that she has been smoking Cigarettes.  She has a 35.00 pack-year smoking history. She has never used smokeless tobacco. She reports that she does not drink alcohol or use drugs. The patient lives alone, but has many friends and family in the area who can assist her.  ALLERGIES: Penicillins  MEDICATIONS:  Current Outpatient Prescriptions  Medication Sig Dispense Refill  . albuterol (PROVENTIL HFA;VENTOLIN HFA) 108 (90 BASE) MCG/ACT inhaler Inhale 2 puffs into the lungs every 6 (six) hours as needed for wheezing or shortness of breath.    Marland Kitchen aspirin 81 MG chewable tablet Chew 81 mg by mouth daily.    . budesonide-formoterol (SYMBICORT) 160-4.5 MCG/ACT inhaler Inhale 2 puffs into the lungs daily.     Marland Kitchen CALCIUM PO Take 1 tablet by mouth daily.    . cetirizine (ZYRTEC) 10 MG tablet Take 10 mg by mouth daily.    Marland Kitchen dexlansoprazole (DEXILANT) 60 MG capsule Take 1 capsule (60 mg total) by mouth daily before breakfast. 30 capsule 11  . diclofenac sodium (VOLTAREN) 1 % GEL Apply 4 g topically 4 (four) times daily.    . DULoxetine (CYMBALTA) 60 MG capsule Take 60 mg by mouth daily.    . fluticasone (FLONASE) 50 MCG/ACT nasal spray Place 2 sprays into both nostrils daily.    Marland Kitchen gabapentin (NEURONTIN) 800 MG tablet Take 800 mg by mouth 4 (four) times daily.  2  . ibuprofen (ADVIL,MOTRIN) 800 MG tablet Take 800 mg by mouth every 8 (eight) hours as needed for moderate pain.     Marland Kitchen levothyroxine (SYNTHROID, LEVOTHROID) 100 MCG tablet Take 100 mcg by mouth daily before breakfast. Reported on 02/03/2016    . meclizine (ANTIVERT) 25 MG tablet Take 25 mg by mouth daily.    . methocarbamol (ROBAXIN) 500 MG tablet Take 500 mg by mouth 4 (four) times daily.     . nortriptyline (PAMELOR) 25 MG capsule Take 25 mg by mouth at bedtime.    Marland Kitchen olopatadine (PATANOL) 0.1 % ophthalmic solution Place 1 drop into both eyes 2 (two) times daily.      . Potassium Chloride ER 20 MEQ TBCR Take 40 mEq by mouth daily. 20 tablet 0  . pravastatin (PRAVACHOL) 40 MG tablet Take 40 mg by mouth every evening.    . prochlorperazine (COMPAZINE) 10 MG tablet Take 1 tablet (10 mg total) by mouth every 6 (six) hours as needed for nausea or vomiting. 30 tablet 0  . Tapentadol HCl (NUCYNTA) 100 MG TABS Take 100 mg by mouth 2 (two) times daily.    Marland Kitchen tiotropium (SPIRIVA) 18 MCG inhalation capsule Place 1 capsule (18 mcg total) into inhaler and inhale daily. 30 capsule 6  . TRINTELLIX 10 MG TABS Take 10 mg by mouth daily.  5  . zolpidem (AMBIEN) 10 MG tablet Take 10 mg by mouth at bedtime as needed for sleep.     No current facility-administered medications for this encounter.     REVIEW OF SYSTEMS:  A 10+ POINT REVIEW OF SYSTEMS WAS OBTAINED including neurology, dermatology, psychiatry, cardiac, respiratory, lymph, extremities, GI, GU, musculoskeletal, constitutional, reproductive, HEENT. All pertinent positives are noted in the HPI. All others are negative.   PHYSICAL EXAM:  Vitals with BMI 03/10/2017  Height 5\' 0"   Weight 172 lbs 5 oz  BMI 54.6  Systolic 503  Diastolic 94  Pulse 546  Respirations 17  General: Alert  and oriented, in no acute distress. Accompanied by daughter on evaluation today HEENT: Head is normocephalic. Extraocular movements are intact. Oropharynx is clear. Upper dentures not removed for exam. Neck: Neck is supple, no palpable cervical or supraclavicular lymphadenopathy. Heart: Regular in rate and rhythm with no murmurs. Chest: Tattoos on chest from prior right breast RT. Clear to auscultation bilaterally, with no rhonchi, wheezes, or rales.  Abdomen: Soft, non tender, non distended, with no rigidity or guarding. Extremities: No edema. Lymphatics: see Neck Exam Skin: No concerning lesions. Musculoskeletal: symmetric strength and muscle tone throughout. Neurologic: Cranial nerves II through XII are grossly intact. No obvious  focalities. Speech is fluent. Coordination is intact. Psychiatric: Judgment and insight are intact. Affect is appropriate.   ECOG = 1   LABORATORY DATA:  Lab Results  Component Value Date   WBC 13.9 (H) 03/10/2017   HGB 12.7 03/10/2017   HCT 40.7 03/10/2017   MCV 85.5 03/10/2017   PLT 312 03/10/2017   Lab Results  Component Value Date   NA 135 (L) 03/10/2017   K 4.2 03/10/2017   CL 99 02/25/2017   CO2 31 (H) 03/10/2017   Lab Results  Component Value Date   ALT 7 03/10/2017   AST 15 03/10/2017   ALKPHOS 116 03/10/2017   BILITOT 1.01 03/10/2017    PULMONARY FUNCTION TEST:   Recent Review Flowsheet Data    There is no flowsheet data to display.      RADIOGRAPHY: Nm Pet Image Initial (pi) Skull Base To Thigh  Result Date: 02/11/2017 CLINICAL DATA:  Initial treatment strategy for lung mass. EXAM: NUCLEAR MEDICINE PET SKULL BASE TO THIGH TECHNIQUE: 8.7 mCi F-18 FDG was injected intravenously. Full-ring PET imaging was performed from the skull base to thigh after the radiotracer. CT data was obtained and used for attenuation correction and anatomic localization. FASTING BLOOD GLUCOSE:  Value: 77 mg/dl COMPARISON:  None. FINDINGS: NECK No hypermetabolic lymph nodes in the neck. CHEST RIGHT upper lobe mass measuring 5.4 x 4.6 cm has intense peripheral metabolic activity with SUV max equal 24. Hypermetabolic RIGHT hilar lymph node on image 61, series 607. Hypermetabolic RIGHT lower paratracheal lymph node measures 13 mm with SUV max equal 21.8. No hypermetabolic supraclavicular lymph nodes. No new hypermetabolic contralateral adenopathy. No LEFT lung pulmonary nodules. ABDOMEN/PELVIS LEFT hydronephrosis and dilatation of the LEFT renal pelvis. The more distal LEFT ureter appears normal diameter. Bladder appears normal. 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  Ovaries normal. SKELETON No focal hypermetabolic activity to suggest skeletal metastasis. Multiple healed RIGHT rib  fractures. IMPRESSION: 1. Hypermetabolic RIGHT upper lobe mass consists with bronchogenic carcinoma. 2. Hypermetabolic RIGHT hilar and ipsilateral RIGHT paratracheal metastatic adenopathy. 3. No contralateral nodal metastasis or supraclavicular nodal metastasis. 4. No distant metastatic disease. 5. Hydronephrosis and LEFT renal pelvis dilatation suggests UPJ obstruction. Recommend urology consultation. Electronically Signed   By: Suzy Bouchard M.D.   On: 02/11/2017 13:09      IMPRESSION: Stage III Squamous Cell Carcinoma of the right upper lobe, pending brain MRI. Today, I talked to the patient and family about the findings and work-up thus far.  We discussed the natural history of lung cancer and general treatment, highlighting the role of radiotherapy in the management.  We discussed the available radiation techniques, and focused on the details of logistics and delivery.  We reviewed the anticipated acute and late sequelae associated with radiation in this setting.  This patient would be a good candidate for 6 weeks of  radiation therapy.  The patient was encouraged to ask questions that I answered to the best of my ability.  The patient would like to proceed with radiation and will be scheduled for CT simulation. A consent form was discussed and signed.  PLAN: The patient will proceed with staging MRI. Subsequently, she may proceed with simulation and treatment planning. She is scheduled for simulation on Tuesday 7/24 at 11 am, with plans to begin treatment soon thereafter the week of July 30. Given the patient's prior history of right breast radiation treatments, the patient will be treated with intensity modulated radiation therapy (IMRT) to limit the potential overlap along the right chest region.    ------------------------------------------------  [name], PhD, MD  This document serves as a record of services personally performed by Gery Pray, MD. It was created on his behalf by  Maryla Morrow, a trained medical scribe. The creation of this record is based on the scribe's personal observations and the provider's statements to them. This document has been checked and approved by the attending provider.

## 2017-03-10 NOTE — Telephone Encounter (Signed)
Spoke with patient who is aware that BQ gave the ok for the order being placed. The order was placed and she had no additional questions at this time. Nothing further is needed

## 2017-03-10 NOTE — Therapy (Signed)
[place], [place], [address] Phone: [phone]   Fax:  [phone]  Physical Therapy Evaluation  Patient Details  Name: Sydney Blair MRN: [id number] Date of Birth: 1956-06-18 Referring Provider: Dr. Curt Blair  Encounter Date: 03/10/2017      PT End of Session - 03/10/17 1613    Visit Number 1   Number of Visits 1   PT Start Time 1427   PT Stop Time 1447   PT Time Calculation (min) 20 min   Activity Tolerance Patient tolerated treatment well   Behavior During Therapy Sydney Blair for tasks assessed/performed      Past Medical History:  Diagnosis Date  . Anxiety   . ARF (acute renal failure) ([place]) 08/07/2011   ?   Marland Kitchen Arthritis   . Cancer ([place]) 2005   breast-right  . Chronic back pain   . COPD (chronic obstructive pulmonary disease) ([place])   . Depression   . Depression with anxiety   . Dyspnea    on exertion  . Fibromyalgia   . GERD (gastroesophageal reflux disease)   . Hypercholesteremia   . Hypothyroidism   . Incontinence   . Neuropathy   . Peripheral neuropathy   . Sciatic pain   . Sebaceous cyst    lt labial location  . Sleep apnea    uses CPAP  . Stage III squamous cell carcinoma of right lung ([place]) 03/10/2017  . Tobacco abuse     Past Surgical History:  Procedure Laterality Date  . bladder tack    . BREAST BIOPSY Right 12/29/2016   Procedure: RIGHT BREAST BIOPSY;  Surgeon: Sydney Signs, MD;  Location: [facility];  Service: General;  Laterality: Right;  . BREAST LUMPECTOMY     right with node dissection  . COLONOSCOPY  11/11/2010   IWP:[id number] rectal polyp and splenic flexure otherwise normal; pathology showed  tubular adenomas. Next TCS 10/2015.  Marland Kitchen ESOPHAGOGASTRODUODENOSCOPY  11/11/2010   KNL:ZJQBH hiatal hernia, 67 French dilator  . ESOPHAGOGASTRODUODENOSCOPY (EGD) WITH ESOPHAGEAL DILATION N/A 06/21/2013   ALP:FXTKWI esophagus  - status post Maloney dilation s/p bx  (benign esophageal bx)  . VIDEO BRONCHOSCOPY WITH ENDOBRONCHIAL ULTRASOUND N/A 02/28/2017   Procedure: VIDEO BRONCHOSCOPY WITH ENDOBRONCHIAL ULTRASOUND;  Surgeon: Sydney Doom, MD;  Location: [facility];  Service: Thoracic;  Laterality: N/A;    There were no vitals filed for this visit.       Subjective Assessment - 03/10/17 1501    Subjective I have a bad back and foot & leg pain from neuropathy.   Patient is accompained by: Family member  sister Sydney Blair   Pertinent History Incidental finding of lung nodule on pre-op chest x-ray for breast biopsy (which was not malignant).  Scans and bronchoscopy indicated right upper lobe malignant cells consistent with squamous cell carcinoma.  Tumor is 5.4 cm. and ipsilateral nodes were hypermetabolic on PET scan.  She is expected to get chemoradiation and then probably immuno-therapy.  Current smoker. h/o breast cancer 2005 s/p mastectomy. Fibromyalgia, LE neuropathy, and back pain for which surgery was recommended but has been put on hold with current lung cancer diagnosis.   Patient Stated Goals get info from all lung clinic providers   Currently in Pain? Yes   Pain Score 9   9.5   Pain Location Leg   Pain Orientation Right;Left   Pain Descriptors / Indicators Burning   Pain Type Neuropathic pain   Aggravating Factors  movement   Pain Relieving  Factors nothing            [facility] PT Assessment - 03/10/17 0001      Assessment   Medical Diagnosis squamous cell carcinoma of right upper lobe   Referring Provider Dr. Curt Blair   Onset Date/Surgical Date 12/08/16   Hand Dominance Right   Prior Therapy none     Precautions   Precautions Fall;Other (comment)   Precaution Comments on oxygen; cancer precautions     Restrictions   Weight Bearing Restrictions No     Balance Screen   Has the patient fallen in the past 6 months No   Has the patient had a decrease in activity level because of a fear of falling?  No   Is the patient  reluctant to leave their home because of a fear of falling?  No     Home Ecologist residence   Living Arrangements --  someone at home with her   Type of [place] to enter   Entrance Stairs-Number of Steps 4   Entrance Stairs-Rails --  yes   Home Layout One level     Prior Function   Level of Independence Independent   Leisure no regular exercise     Cognition   Overall Cognitive Status Within Functional Limits for tasks assessed     Functional Tests   Functional tests Sit to Stand     Sit to Stand   Comments does one repetition without pushing off with hands; limited by pain from doing more     Posture/Postural Control   Posture/Postural Control No significant limitations     ROM / Strength   AROM / PROM / Strength AROM     AROM   Overall AROM Comments trunk AROM in standing: flexion 90% loss, extension 95% loss, both limited by back pain; sidebend 50% loss bilat.,rotation 75% loss bilat.     Ambulation/Gait   Ambulation/Gait Yes   Ambulation/Gait Assistance 6: Modified independent (Device/Increase time)   Gait Comments able to walk without assistive device but has a straight cane which she uses "sometimes"; on O2 and says her saturation drops with activity, but she uses O2 prn.  Appears unstead with short distance ambulation on level.     Balance   Balance Assessed Yes     Dynamic Standing Balance   Dynamic Standing - Comments reaches forward 4 inches in standing, limited by pain, and well below average for age            Objective measurements completed on examination: See above findings.                  PT Education - 03/10/17 1612    Education provided Yes   Education Details energy conservation, walking or staying active, CURE article on staying active, posture, breathing, PT info   Person(s) Educated Patient;Other (comment)  sister   Methods Explanation;Handout    Comprehension Verbalized understanding               Lung Clinic Goals - 03/10/17 1631      Patient will be able to verbalize understanding of the benefit of exercise to decrease fatigue.   Status Achieved     Patient will be able to verbalize the importance of posture.   Status Achieved     Patient will be able to demonstrate diaphragmatic breathing for improved lung function.   Status Achieved  Patient will be able to verbalize understanding of the role of physical therapy to prevent functional decline and who to contact if physical therapy is needed.   Status Achieved              Plan - 03/10/17 1613    Clinical Impression Statement This is a woman with multiple health problems now with a 5.4 cm. right upper lobe nodule of squamous cell carcinoma.  She is expected to have chemoradiation treatment.  She has back pain that limits her AROM significantly as well as limiting her ability to do repeated sit to stand.  She is on oxygen and also uses a cane for gait part of the time.    History and Personal Factors relevant to plan of care: current smoker, fibromyalgia, back pain, LE neuropathy   Clinical Presentation Evolving   Clinical Presentation due to: new lung cancer diagnosis and expected to have chemoradiation treatment   Clinical Decision Making Moderate   Rehab Potential Fair   PT Frequency One time visit   PT Treatment/Interventions Patient/family education   PT Next Visit Plan none at this time   PT Home Exercise Plan walking or other exercise, breathing exercise   Consulted and Agree with Plan of Care Patient      Patient will benefit from skilled therapeutic intervention in order to improve the following deficits and impairments:  Decreased mobility, Decreased range of motion, Decreased balance, Cardiopulmonary status limiting activity  Visit Diagnosis: Difficulty in walking, not elsewhere classified - Plan: PT plan of care cert/re-cert  Chronic  right-sided low back pain without sciatica - Plan: PT plan of care cert/re-cert  Pain in both lower extremities - Plan: PT plan of care cert/re-cert  Unsteadiness on feet - Plan: PT plan of care cert/re-cert     Problem List Patient Active Problem List   Diagnosis Date Noted  . Stage III squamous cell carcinoma of right lung ([place]) 03/10/2017  . Goals of care, counseling/discussion 03/10/2017  . Encounter for antineoplastic chemotherapy 03/10/2017  . Lung mass 02/01/2017  . Breast mass, right   . Hx of adenomatous colonic polyps 12/28/2016  . Fracture of fifth metatarsal bone of right foot with delayed healing 02/03/2016  . IBS (irritable bowel syndrome) 07/30/2015  . Diarrhea 10/29/2014  . Hoarseness 10/29/2014  . Chronic hoarseness 01/16/2014  . Other dysphagia 01/16/2014  . Dysphagia, unspecified(787.20) 06/04/2013  . Unspecified constipation 06/04/2013  . Unsteady gait 01/25/2013  . COPD (chronic obstructive pulmonary disease) with emphysema ([place]) 12/26/2012  . DOE (dyspnea on exertion) 12/01/2012  . 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 Dysphagia 11/09/2012  . COPD exacerbation ([place]) 08/07/2011  . Hypokalemia 08/07/2011  . Myositis 08/07/2011  . Dehydration 08/07/2011  . ARF (acute renal failure) ([place]) 08/07/2011  . Tubular adenoma 07/20/2011  . INSOMNIA 02/26/2008  . CERUMEN IMPACTION, RIGHT 01/23/2008  . 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 NECK PAIN 11/22/2007  . HAMMER TOE 11/22/2007  . ANXIETY 01/30/2007  . TOBACCO ABUSE 01/30/2007  . DEPRESSION 01/30/2007  . GERD 01/30/2007  . PROLAPSE, VAGINAL WALL, CYSTOCELE, MIDLINE 01/10/2007  . INCONTINENCE 01/10/2007  . SEBACEOUS CYST 08/17/2006  . ADENOCARCINOMA, BREAST, RIGHT 12/01/2003    SALISBUR[place],DONNA 03/10/2017, 4:33 PM  [place], [place], [address] Phone: [phone]   Fax:   [phone]  Name: Sydney Blair MRN: [id number] Date of Birth: 1956-03-25  Serafina Royals, PT 03/10/17 4:33 PM

## 2017-03-10 NOTE — Progress Notes (Signed)
Sydney [phone]   [phone] Multidisciplinary thoracic oncology clinic  CONSULT NOTE  REFERRING PHYSICIAN: Dr. Simonne Maffucci  REASON FOR CONSULTATION:  61 years old white female recently diagnosed with lung cancer.  HPI Sydney Blair is a 61 y.o. female with a long history of smoking and past medical history significant for right breast carcinoma diagnosed in 2005 status post lumpectomy followed by radiation and chemotherapy, COPD, depression, GERD, fibromyalgia, hypothyroidism, chronic back pain and peripheral neuropathy. The patient was is scheduled to have a right breast biopsy and had chest x-ray performed because of persistent cough for 1 week. Her chest x-ray on 12/08/2016 showed 5.0 cm masslike opacity in the right upper lobe. She was referred to Dr. Lake Bells and she had a PET scan performed on 02/11/2017 and it showed right lower lobe mass measuring 5.4 x 4.6 cm with intense hypermetabolic activity with SUV max equal 24. There was also hypermetabolic right hilar lymph node and hypermetabolic right lower paratracheal lymph node measuring 1.3 cm with SUV max equal 21.8. There was no evidence for contralateral lymph node or distant metastatic disease. Her scan also showed hydronephrosis and left adrenal pelvis dietitian suggesting UPJ obstruction. On 02/28/2017 the patient underwent flexible video fiberoptic bronchoscopy with endobronchial ultrasound and biopsies under the care of Dr. Lake Bells. The final pathology ([id number]) showed malignant cells consistent with squamous cell carcinoma. PDL 1 expression was negative. Dr. Lake Bells kindly referred the patient to the multidisciplinary thoracic oncology clinic today for evaluation and discussion of her treatment options. When seen today the patient is feeling fine except for lack of energy secondary to fibromyalgia. She also has neuropathy and occasional dizzy spells. She has chest pain with  cough and shortness breath at baseline and increased with exertion. She is currently on home oxygen. She also has dry cough. The patient denied having any hemoptysis. She has no significant weight loss or night sweats. She has no nausea, vomiting, diarrhea or constipation. She has no headache or visual changes. Family history significant for father with throat cancer. The patient is married and has one son who is deceased. She also has a grandson. She was accompanied today by her sister Sydney Blair. She has a history of smoking 2-3 packs per day for around 45 years and unfortunately she continues to smoke one pack per day and I strongly encouraged her to quit smoking. No history of alcohol or drug abuse.  HPI  Past Medical History:  Diagnosis Date  . Anxiety   . ARF (acute renal failure) ([place]) 08/07/2011   ?   Marland Kitchen Arthritis   . Cancer ([place]) 2005   breast-right  . Chronic back pain   . COPD (chronic obstructive pulmonary disease) ([place])   . Depression   . Depression with anxiety   . Dyspnea    on exertion  . Fibromyalgia   . GERD (gastroesophageal reflux disease)   . Hypercholesteremia   . Hypothyroidism   . Incontinence   . Neuropathy   . Peripheral neuropathy   . Sciatic pain   . Sebaceous cyst    lt labial location  . Sleep apnea    uses CPAP  . Tobacco abuse     Past Surgical History:  Procedure Laterality Date  . bladder tack    . BREAST BIOPSY Right 12/29/2016   Procedure: RIGHT BREAST BIOPSY;  Surgeon: Aviva Signs, MD;  Location: [facility];  Service: General;  Laterality: Right;  . BREAST LUMPECTOMY  right with node dissection  . COLONOSCOPY  11/11/2010   [id number] rectal polyp and splenic flexure otherwise normal; pathology showed  tubular adenomas. Next TCS 10/2015.  Marland Kitchen ESOPHAGOGASTRODUODENOSCOPY  11/11/2010   KGU:RKYHC hiatal hernia, 46 French dilator  . ESOPHAGOGASTRODUODENOSCOPY (EGD) WITH ESOPHAGEAL DILATION N/A 06/21/2013   WCB:JSEGBT esophagus  - status post  Maloney dilation s/p bx (benign esophageal bx)  . VIDEO BRONCHOSCOPY WITH ENDOBRONCHIAL ULTRASOUND N/A 02/28/2017   Procedure: VIDEO BRONCHOSCOPY WITH ENDOBRONCHIAL ULTRASOUND;  Surgeon: Juanito Doom, MD;  Location: [facility];  Service: Thoracic;  Laterality: N/A;    Family History  Problem Relation Age of Onset  . Cancer Other   . Heart attack Other   . Lung disease Other   . Emphysema Mother     Social History Social History  Substance Use Topics  . Smoking status: Current Every Day Smoker    Packs/day: 1.00    Years: 35.00    Types: Cigarettes  . Smokeless tobacco: Never Used     Comment: has cut back to one pack daily  . Alcohol use No    Allergies  Allergen Reactions  . Penicillins Anaphylaxis and Other (See Comments)    Has patient had a PCN reaction causing immediate rash, facial/tongue/throat swelling, SOB or lightheadedness with hypotension:Yes Has patient had a PCN reaction causing severe rash involving mucus membranes or skin necrosis:Yes Has patient had a PCN reaction that required hospitalization:No Has patient had a PCN reaction occurring within the last 10 years:No If all of the above answers are "NO", then may proceed with Cephalosporin use.     Current Outpatient Prescriptions  Medication Sig Dispense Refill  . albuterol (PROVENTIL HFA;VENTOLIN HFA) 108 (90 BASE) MCG/ACT inhaler Inhale 2 puffs into the lungs every 6 (six) hours as needed for wheezing or shortness of breath.    Marland Kitchen aspirin 81 MG chewable tablet Chew 81 mg by mouth daily.    . budesonide-formoterol (SYMBICORT) 160-4.5 MCG/ACT inhaler Inhale 2 puffs into the lungs daily.     Marland Kitchen CALCIUM PO Take 1 tablet by mouth daily.    . cetirizine (ZYRTEC) 10 MG tablet Take 10 mg by mouth daily.    Marland Kitchen dexlansoprazole (DEXILANT) 60 MG capsule Take 1 capsule (60 mg total) by mouth daily before breakfast. 30 capsule 11  . diclofenac sodium (VOLTAREN) 1 % GEL Apply 4 g topically 4 (four) times daily.    .  DULoxetine (CYMBALTA) 60 MG capsule Take 60 mg by mouth daily.    . fluticasone (FLONASE) 50 MCG/ACT nasal spray Place 2 sprays into both nostrils daily.    Marland Kitchen gabapentin (NEURONTIN) 800 MG tablet Take 800 mg by mouth 4 (four) times daily.  2  . levothyroxine (SYNTHROID, LEVOTHROID) 100 MCG tablet Take 100 mcg by mouth daily before breakfast. Reported on 02/03/2016    . meclizine (ANTIVERT) 25 MG tablet Take 25 mg by mouth daily.    . methocarbamol (ROBAXIN) 500 MG tablet Take 500 mg by mouth 4 (four) times daily.     . nortriptyline (PAMELOR) 25 MG capsule Take 25 mg by mouth at bedtime.    Marland Kitchen olopatadine (PATANOL) 0.1 % ophthalmic solution Place 1 drop into both eyes 2 (two) times daily.     . pravastatin (PRAVACHOL) 40 MG tablet Take 40 mg by mouth every evening.    . Tapentadol HCl (NUCYNTA) 100 MG TABS Take 100 mg by mouth 2 (two) times daily.    Marland Kitchen tiotropium (SPIRIVA) 18 MCG inhalation capsule Place  1 capsule (18 mcg total) into inhaler and inhale daily. 30 capsule 6  . TRINTELLIX 10 MG TABS Take 10 mg by mouth daily.  5  . zolpidem (AMBIEN) 10 MG tablet Take 10 mg by mouth at bedtime as needed for sleep.    Marland Kitchen ibuprofen (ADVIL,MOTRIN) 800 MG tablet Take 800 mg by mouth every 8 (eight) hours as needed for moderate pain.     Marland Kitchen Potassium Chloride ER 20 MEQ TBCR Take 40 mEq by mouth daily. 20 tablet 0   No current facility-administered medications for this visit.     Review of Systems  Constitutional: positive for fatigue Eyes: negative Ears, nose, mouth, throat, and face: negative Respiratory: positive for cough, dyspnea on exertion and wheezing Cardiovascular: negative Gastrointestinal: negative Genitourinary:negative Integument/breast: negative Hematologic/lymphatic: negative Musculoskeletal:positive for back pain Neurological: positive for dizziness and paresthesia Behavioral/Psych: negative Endocrine: negative Allergic/Immunologic: negative  Physical Exam  UKG:URKYH,  healthy, no distress, well nourished, well developed and anxious SKIN: skin color, texture, turgor are normal, no rashes or significant lesions HEAD: Normocephalic, No masses, lesions, tenderness or abnormalities EYES: normal, PERRLA, Conjunctiva are pink and non-injected EARS: External ears normal, Canals clear OROPHARYNX:no exudate, no erythema and lips, buccal mucosa, and tongue normal  NECK: supple, no adenopathy, no JVD LYMPH:  no palpable lymphadenopathy, no hepatosplenomegaly BREAST:not examined LUNGS: expiratory wheezes bilaterally HEART: regular rate & rhythm, no murmurs and no gallops ABDOMEN:abdomen soft, non-tender, obese, normal bowel sounds and no masses or organomegaly BACK: Back symmetric, no curvature., No CVA tenderness EXTREMITIES:no joint deformities, effusion, or inflammation, no edema, no skin discoloration  NEURO: alert & oriented x 3 with fluent speech, no focal motor/sensory deficits  PERFORMANCE STATUS: ECOG 1  LABORATORY DATA: Lab Results  Component Value Date   WBC 13.9 (H) 03/10/2017   HGB 12.7 03/10/2017   HCT 40.7 03/10/2017   MCV 85.5 03/10/2017   PLT 312 03/10/2017      Chemistry      Component Value Date/Time   NA 135 (L) 03/10/2017 1350   K 4.2 03/10/2017 1350   CL 99 02/25/2017 1234   CO2 31 (H) 03/10/2017 1350   BUN 7.6 03/10/2017 1350   CREATININE 0.8 03/10/2017 1350      Component Value Date/Time   CALCIUM 9.2 03/10/2017 1350   ALKPHOS 116 03/10/2017 1350   AST 15 03/10/2017 1350   ALT 7 03/10/2017 1350   BILITOT 1.01 03/10/2017 1350       RADIOGRAPHIC STUDIES: Nm Pet Image Initial (pi) Skull Base To Thigh  Result Date: 02/11/2017 CLINICAL DATA:  Initial treatment strategy for lung mass. EXAM: NUCLEAR MEDICINE PET SKULL BASE TO THIGH TECHNIQUE: 8.7 mCi F-18 FDG was injected intravenously. Full-ring PET imaging was performed from the skull base to thigh after the radiotracer. CT data was obtained and used for attenuation  correction and anatomic localization. FASTING BLOOD GLUCOSE:  Value: 77 mg/dl COMPARISON:  None. FINDINGS: NECK No hypermetabolic lymph nodes in the neck. CHEST RIGHT upper lobe mass measuring 5.4 x 4.6 cm has intense peripheral metabolic activity with SUV max equal 24. Hypermetabolic RIGHT hilar lymph node on image 61, series 607. Hypermetabolic RIGHT lower paratracheal lymph node measures 13 mm with SUV max equal 21.8. No hypermetabolic supraclavicular lymph nodes. No new hypermetabolic contralateral adenopathy. No LEFT lung pulmonary nodules. ABDOMEN/PELVIS LEFT hydronephrosis and dilatation of the LEFT renal pelvis. The more distal LEFT ureter appears normal diameter. Bladder appears normal. 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  Ovaries normal. SKELETON  No focal hypermetabolic activity to suggest skeletal metastasis. Multiple healed RIGHT rib fractures. IMPRESSION: 1. Hypermetabolic RIGHT upper lobe mass consists with bronchogenic carcinoma. 2. Hypermetabolic RIGHT hilar and ipsilateral RIGHT paratracheal metastatic adenopathy. 3. No contralateral nodal metastasis or supraclavicular nodal metastasis. 4. No distant metastatic disease. 5. Hydronephrosis and LEFT renal pelvis dilatation suggests UPJ obstruction. Recommend urology consultation. Electronically Signed   By: Suzy Bouchard M.D.   On: 02/11/2017 13:09    ASSESSMENT:This is a very pleasant 61 years old white female with a stage IIIa (T2b, N2, M0) non-small cell lung cancer, squamous cell carcinoma presented with large right upper lobe lung mass in addition to right hilar and mediastinal lymphadenopathy diagnosed in July 2018. PDL 1 expression is negative.  PLAN: I had a lengthy discussion with the patient and her sister today about her current disease stage, prognosis and treatment options. I recommended for the patient to complete the staging workup by ordering a MRI of the brain to rule out brain metastasis. I discussed with the  patient the treatment options for stage IIIa non-small cell lung cancer. I recommended for the patient a course of concurrent chemoradiation with weekly carboplatin for AUC of 2 and paclitaxel 45 MG/M2. I discussed with the patient adverse effect of the chemotherapy including but not limited to alopecia, myelosuppression, nausea and vomiting, peripheral neuropathy, liver or renal dysfunction. I will arrange for the patient to have a chemotherapy education class before starting the first dose of his chemotherapy. The patient will see Dr. Sondra Come later today for evaluation and discussion of the radiotherapy option. This course of concurrent chemoradiation will be followed by consolidation treatment with immunotherapy if she has no evidence for disease progression. The patient was seen during the multidisciplinary thoracic oncology clinic today by medical oncology, radiation oncology, thoracic navigator, social worker and physical therapist. I will call her pharmacy with prescription for Compazine 10 mg by mouth every 6 hours as needed for nausea. I will see the patient back for follow-up visit in 3 weeks for reevaluation and management of any adverse effect of her treatment. For the COPD, she will continue her routine follow-up visit and treatment under the care of Dr. Lake Bells. She was also advised to use her home oxygen as prescribed. She was advised to call immediately if she has any concerning symptoms in the interval. The patient voices understanding of current disease status and treatment options and is in agreement with the current care plan. All questions were answered. The patient knows to call the clinic with any problems, questions or concerns. We can certainly see the patient much sooner if necessary.  Thank you so much for allowing me to participate in the care of Sydney Blair. I will continue to follow up the patient with you and assist in her care.  I spent 55 minutes counseling the  patient face to face. The total time spent in the appointment was 80 minutes.  Disclaimer: This note was dictated with voice recognition software. Similar sounding words can inadvertently be transcribed and may not be corrected upon review.   Sydney Tep K. March 10, 2017, 2:39 PM

## 2017-03-10 NOTE — Telephone Encounter (Signed)
Scheduled chemo edu class per 7/19 los - unable to schedule treatment due to capped day - gave patient AVS and calender

## 2017-03-10 NOTE — Progress Notes (Signed)
START ON PATHWAY REGIMEN - Non-Small Cell Lung     Administer weekly:     Paclitaxel      Carboplatin   **Always confirm dose/schedule in your pharmacy ordering system**    Patient Characteristics: Stage III - Unresectable, PS = 0, 1 AJCC T Category: T3 Current Disease Status: No Distant Mets or Local Recurrence AJCC N Category: N2 AJCC M Category: M0 AJCC 8 Stage Grouping: IIIB Performance Status: PS = 0, 1 Intent of Therapy: Curative Intent, Discussed with Patient

## 2017-03-14 ENCOUNTER — Encounter: Payer: Self-pay | Admitting: *Deleted

## 2017-03-14 NOTE — Progress Notes (Signed)
Oncology Nurse Navigator Documentation  Oncology Nurse Navigator Flowsheets 03/14/2017  Navigator Location [facility]  Navigator Encounter Type Other/I updated Dr. Julien Nordmann on PDL 1 results.   Treatment Phase Pre-Tx/Tx Discussion  Barriers/Navigation Needs Coordination of Care  Interventions Coordination of Care  Coordination of Care Other  Acuity Level 1  Time Spent with Patient 15

## 2017-03-15 ENCOUNTER — Ambulatory Visit: Admission: RE | Admit: 2017-03-15 | Payer: Medicaid Other | Source: Ambulatory Visit | Admitting: Radiation Oncology

## 2017-03-15 ENCOUNTER — Other Ambulatory Visit: Payer: Medicaid Other

## 2017-03-17 ENCOUNTER — Other Ambulatory Visit: Payer: Medicaid Other

## 2017-03-17 ENCOUNTER — Encounter: Payer: Self-pay | Admitting: *Deleted

## 2017-03-17 ENCOUNTER — Ambulatory Visit
Admission: RE | Admit: 2017-03-17 | Discharge: 2017-03-17 | Disposition: A | Discharge status: Home / Self Care | Payer: Medicaid Other | Source: Ambulatory Visit | Attending: Radiation Oncology | Admitting: Radiation Oncology

## 2017-03-17 DIAGNOSIS — C3491 Malignant neoplasm of unspecified part of right bronchus or lung: Secondary | ICD-10-CM | POA: Diagnosis not present

## 2017-03-17 DIAGNOSIS — Z510 Encounter for antineoplastic radiation therapy: Secondary | ICD-10-CM | POA: Diagnosis present

## 2017-03-17 NOTE — Progress Notes (Signed)
Oncology Nurse Navigator Documentation  Oncology Nurse Navigator Flowsheets 03/17/2017  Navigator Location [facility]  Navigator Encounter Type Other/I received a message from HIM regarding urology referral.  I was told scheduling would complete. I contacted scheduling for an update on referral.   Treatment Phase Pre-Tx/Tx Discussion  Barriers/Navigation Needs Coordination of Care  Interventions Coordination of Care  Coordination of Care Other  Acuity Level 1  Time Spent with Patient 15

## 2017-03-17 NOTE — Progress Notes (Signed)
  Radiation Oncology         [phone]) [phone] ________________________________  Name: Sydney Blair MRN: [id number]  Date: 03/17/2017  DOB: Mar 13, 1956  SIMULATION AND TREATMENT PLANNING NOTE    ICD-10-CM   1. Stage III squamous cell carcinoma of right lung (HCC) C34.91     DIAGNOSIS:  Stage III Squamous Cell Carcinoma of the right upper lobe, pending brain MRI   NARRATIVE:  The patient was brought to the [facility].  Identity was confirmed.  All relevant records and images related to the planned course of therapy were reviewed.  The patient freely provided informed written consent to proceed with treatment after reviewing the details related to the planned course of therapy. The consent form was witnessed and verified by the simulation staff.  Then, the patient was set-up in a stable reproducible  supine position for radiation therapy.  CT images were obtained.  Surface markings were placed.  The CT images were loaded into the planning software.  Then the target and avoidance structures were contoured.  Treatment planning then occurred.  The radiation prescription was entered and confirmed.  Then, I designed and supervised the construction of a total of 2 medically necessary complex treatment devices, including a BodyFix immobilization mold custom fitted to the patient along with conformally shaped radiation around the treatment target while shielding critical structures such as the heart and spinal cord maximally.  I have requested : IMRT in light of previous right breast radiation treatment and potential overlap issues.  I have requested a DVH of the following structures: Left lung, right lung, spinal cord, heart, esophagus, and target.  I have ordered:dose calc.  SPECIAL TREATMENT PROCEDURE:  The planned course of therapy using radiation constitutes a special treatment procedure. Special care is required in the management of this patient for the following reasons.  The patient  will be receiving concurrent chemotherapy requiring careful monitoring for increased toxicities of treatment including periodic laboratory values.  The special nature of the planned course of radiotherapy will require increased physician supervision and oversight to ensure patient's safety with optimal treatment outcomes.  PLAN:  The patient will receive 60 Gy in 30 fractions.  -----------------------------------  [name], PhD, MD  This document serves as a record of services personally performed by Gery Pray, MD. It was created on his behalf by Arlyce Harman, a trained medical scribe. The creation of this record is based on the scribe's personal observations and the provider's statements to them. This document has been checked and approved by the attending provider.

## 2017-03-17 NOTE — Progress Notes (Signed)
Oncology Nurse Navigator Documentation  Oncology Nurse Navigator Flowsheets 03/17/2017  Navigator Location [facility]  Navigator Encounter Type Other/I followed up on Dr. Worthy Flank order for referral to [facility].  I contacted HIM referral coordinator for an update.   Treatment Phase Pre-Tx/Tx Discussion  Barriers/Navigation Needs Coordination of Care  Interventions Coordination of Care  Coordination of Care Other  Acuity Level 2  Time Spent with Patient 30

## 2017-03-18 ENCOUNTER — Telehealth: Payer: Self-pay

## 2017-03-18 ENCOUNTER — Ambulatory Visit (HOSPITAL_COMMUNITY)
Admission: RE | Admit: 2017-03-18 | Discharge: 2017-03-18 | Disposition: A | Discharge status: Home / Self Care | Payer: Medicaid Other | Source: Ambulatory Visit | Attending: Internal Medicine | Admitting: Internal Medicine

## 2017-03-18 DIAGNOSIS — J439 Emphysema, unspecified: Secondary | ICD-10-CM | POA: Insufficient documentation

## 2017-03-18 DIAGNOSIS — C3491 Malignant neoplasm of unspecified part of right bronchus or lung: Secondary | ICD-10-CM | POA: Insufficient documentation

## 2017-03-18 DIAGNOSIS — F17200 Nicotine dependence, unspecified, uncomplicated: Secondary | ICD-10-CM | POA: Diagnosis not present

## 2017-03-18 DIAGNOSIS — Z5111 Encounter for antineoplastic chemotherapy: Secondary | ICD-10-CM

## 2017-03-18 DIAGNOSIS — Z9221 Personal history of antineoplastic chemotherapy: Secondary | ICD-10-CM | POA: Insufficient documentation

## 2017-03-18 DIAGNOSIS — Z7189 Other specified counseling: Secondary | ICD-10-CM

## 2017-03-18 DIAGNOSIS — R930 Abnormal findings on diagnostic imaging of skull and head, not elsewhere classified: Secondary | ICD-10-CM | POA: Diagnosis not present

## 2017-03-18 MED ORDER — GADOBENATE DIMEGLUMINE 529 MG/ML IV SOLN
20.0000 mL | Freq: Once | INTRAVENOUS | Status: AC | PRN
  Administered 2017-03-18: 6 mL via INTRAVENOUS

## 2017-03-18 NOTE — Telephone Encounter (Signed)
Stacey at [facility] imaging gave report on Brain MRI done today. Suggesting short term f/u MRI.

## 2017-03-21 ENCOUNTER — Telehealth: Payer: Self-pay | Admitting: Pulmonary Disease

## 2017-03-21 ENCOUNTER — Other Ambulatory Visit (HOSPITAL_BASED_OUTPATIENT_CLINIC_OR_DEPARTMENT_OTHER): Payer: Medicaid Other

## 2017-03-21 ENCOUNTER — Ambulatory Visit (HOSPITAL_BASED_OUTPATIENT_CLINIC_OR_DEPARTMENT_OTHER): Payer: Medicaid Other

## 2017-03-21 ENCOUNTER — Encounter: Payer: Self-pay | Admitting: Internal Medicine

## 2017-03-21 VITALS — BP 121/95 | HR 83 | Temp 98.3°F | Resp 17

## 2017-03-21 DIAGNOSIS — Z5111 Encounter for antineoplastic chemotherapy: Secondary | ICD-10-CM

## 2017-03-21 DIAGNOSIS — C3491 Malignant neoplasm of unspecified part of right bronchus or lung: Secondary | ICD-10-CM

## 2017-03-21 DIAGNOSIS — J439 Emphysema, unspecified: Secondary | ICD-10-CM

## 2017-03-21 DIAGNOSIS — Z7189 Other specified counseling: Secondary | ICD-10-CM

## 2017-03-21 DIAGNOSIS — C3411 Malignant neoplasm of upper lobe, right bronchus or lung: Secondary | ICD-10-CM

## 2017-03-21 DIAGNOSIS — R918 Other nonspecific abnormal finding of lung field: Secondary | ICD-10-CM

## 2017-03-21 DIAGNOSIS — F17200 Nicotine dependence, unspecified, uncomplicated: Secondary | ICD-10-CM

## 2017-03-21 LAB — COMPREHENSIVE METABOLIC PANEL
ALBUMIN: 2.8 g/dL — AB (ref 3.5–5.0)
ALT: 7 U/L (ref 0–55)
AST: 23 U/L (ref 5–34)
Alkaline Phosphatase: 103 U/L (ref 40–150)
Anion Gap: 9 mEq/L (ref 3–11)
BILIRUBIN TOTAL: 0.46 mg/dL (ref 0.20–1.20)
BUN: 5.2 mg/dL — AB (ref 7.0–26.0)
CALCIUM: 9.1 mg/dL (ref 8.4–10.4)
CO2: 33 mEq/L — ABNORMAL HIGH (ref 22–29)
Chloride: 97 mEq/L — ABNORMAL LOW (ref 98–109)
Creatinine: 0.8 mg/dL (ref 0.6–1.1)
EGFR: 86 mL/min/{1.73_m2} — AB (ref >90)
GLUCOSE: 83 mg/dL (ref 70–140)
Potassium: 3.1 mEq/L — ABNORMAL LOW (ref 3.5–5.1)
Sodium: 140 mEq/L (ref 136–145)
TOTAL PROTEIN: 6.9 g/dL (ref 6.4–8.3)

## 2017-03-21 LAB — CBC WITH DIFFERENTIAL/PLATELET
BASO%: 0.3 % (ref 0.0–2.0)
BASOS ABS: 0 10*3/uL (ref 0.0–0.1)
EOS ABS: 0.2 10*3/uL (ref 0.0–0.5)
EOS%: 2.1 % (ref 0.0–7.0)
HEMATOCRIT: 42.8 % (ref 34.8–46.6)
HEMOGLOBIN: 13 g/dL (ref 11.6–15.9)
LYMPH#: 1.4 10*3/uL (ref 0.9–3.3)
LYMPH%: 18.1 % (ref 14.0–49.7)
MCH: 25.9 pg (ref 25.1–34.0)
MCHC: 30.4 g/dL — ABNORMAL LOW (ref 31.5–36.0)
MCV: 85.4 fL (ref 79.5–101.0)
MONO#: 0.5 10*3/uL (ref 0.1–0.9)
MONO%: 6.6 % (ref 0.0–14.0)
NEUT#: 5.6 10*3/uL (ref 1.5–6.5)
NEUT%: 72.9 % (ref 38.4–76.8)
NRBC: 0 % (ref 0–0)
Platelets: 272 10*3/uL (ref 145–400)
RBC: 5.01 10*6/uL (ref 3.70–5.45)
RDW: 16.3 % — AB (ref 11.2–14.5)
WBC: 7.6 10*3/uL (ref 3.9–10.3)

## 2017-03-21 LAB — TECHNOLOGIST REVIEW

## 2017-03-21 MED ORDER — DIPHENHYDRAMINE HCL 50 MG/ML IJ SOLN
50.0000 mg | Freq: Once | INTRAMUSCULAR | Status: AC
  Administered 2017-03-21: 50 mg via INTRAVENOUS

## 2017-03-21 MED ORDER — SODIUM CHLORIDE 0.9 % IV SOLN
Freq: Once | INTRAVENOUS | Status: AC
  Administered 2017-03-21: 13:00:00 via INTRAVENOUS

## 2017-03-21 MED ORDER — DIPHENHYDRAMINE HCL 50 MG/ML IJ SOLN
INTRAMUSCULAR | Status: AC
  Filled 2017-03-21: qty 1

## 2017-03-21 MED ORDER — FAMOTIDINE IN NACL 20-0.9 MG/50ML-% IV SOLN
20.0000 mg | Freq: Once | INTRAVENOUS | Status: AC
  Administered 2017-03-21: 20 mg via INTRAVENOUS

## 2017-03-21 MED ORDER — SODIUM CHLORIDE 0.9 % IV SOLN
20.0000 mg | Freq: Once | INTRAVENOUS | Status: AC
  Administered 2017-03-21: 20 mg via INTRAVENOUS
  Filled 2017-03-21: qty 2

## 2017-03-21 MED ORDER — SODIUM CHLORIDE 0.9 % IV SOLN
237.0000 mg | Freq: Once | INTRAVENOUS | Status: AC
  Administered 2017-03-21: 240 mg via INTRAVENOUS
  Filled 2017-03-21: qty 24

## 2017-03-21 MED ORDER — PALONOSETRON HCL INJECTION 0.25 MG/5ML
INTRAVENOUS | Status: AC
  Filled 2017-03-21: qty 5

## 2017-03-21 MED ORDER — PALONOSETRON HCL INJECTION 0.25 MG/5ML
0.2500 mg | Freq: Once | INTRAVENOUS | Status: AC
  Administered 2017-03-21: 0.25 mg via INTRAVENOUS

## 2017-03-21 MED ORDER — PACLITAXEL CHEMO INJECTION 300 MG/50ML
45.0000 mg/m2 | Freq: Once | INTRAVENOUS | Status: AC
  Administered 2017-03-21: 84 mg via INTRAVENOUS
  Filled 2017-03-21: qty 14

## 2017-03-21 MED ORDER — FAMOTIDINE IN NACL 20-0.9 MG/50ML-% IV SOLN
INTRAVENOUS | Status: AC
  Filled 2017-03-21: qty 50

## 2017-03-21 NOTE — Progress Notes (Signed)
Met w/ pt to introduce myself as her Arboriculturist.  Pt's insurance will pay at 100% so copay assistance is not needed.  I informed her of the [place], went over what it covers and gave her an expense sheet.  Pt would like to apply so she will bring her proof of income on 03/24/17.  I also informed her of the [place].  Pt would like to apply to them as well so I gave her the application to complete.  She will return it along w/ the required documentation needed for processing.  Once received I will email it to [place] for processing.  Pt has my card for any questions or concerns she may have in the future.

## 2017-03-21 NOTE — Progress Notes (Signed)
Pt's O2 sat periodically drops down in the 70's and then recovers to the mid to low 90s within a few seconds.  Pt states that she has a large oxygen tank at home but cannot manage it out of the house.  Pt states Dr Lake Bells of [facility] sent a prescription to [facility] for the patient to get a smaller tank.  Per the pt, [place] cannot honor the prescription until Medicaid/Medicare have the proper documentation of what her oxygen levels are on room air.  This RN has called Dr Kathee Delton office and spoke with Caryl Pina informing her of the above, stressing that the pt needs to get this smaller oxygen tank to carry when out of the house as soon as possible.  At this time, Caryl Pina has taken my information to give to someone else in their office.   Spoke with Alyssa (?) at [facility] who confirms  pt has an appt with Dr Lake Bells on Wed 8/1.  An assessment and documentation to be done at that time in order for pt to get portable oxygen.  Pt states she does have a concentrator at home already.  In the mean time, pt will take one of our oxygen tanks home with her today because she is unable to  maintained her oxygen level above 87% on room.  Pt agrees to return tank to this facility on Wed 8/1.

## 2017-03-21 NOTE — Telephone Encounter (Signed)
I faxed order to [facility] marked "urgent".  Faxed order with Sydney Blair's note & pcc note in snapshot & BQ's office note from last month.  Confirmation has been received.  Nothing further needed.

## 2017-03-21 NOTE — Telephone Encounter (Signed)
Called and spoke to [place] at the cancer center and was advised that pt is desaturating to the 70's but pt only has nocturnal O2; she does not have portable. Elmyra Ricks states they will send her home with one of their tanks so she will have O2 on her way back home to [place]. Called [facility] and spoke to [place], she states because pt's O2 desats at rest she qualifies for O2, Mariann Laster is aware this is the only value we have regarding pt's O2 qualification. Per BQ - ok to order POC with 2lpm at rest and with activity. Order placed. Mariann Laster aware and states she will try and this set up for pt prior to her OV with BQ on 03/23/2017.   PCC's please send order to [facility]. Thanks.

## 2017-03-21 NOTE — Patient Instructions (Signed)
[facility] Discharge Instructions for Patients Receiving Chemotherapy  Today you received the following chemotherapy agents:  Taxol, Carboplatin  To help prevent nausea and vomiting after your treatment, we encourage you to take your nausea medication as prescribed.   If you develop nausea and vomiting that is not controlled by your nausea medication, call the clinic.   BELOW ARE SYMPTOMS THAT SHOULD BE REPORTED IMMEDIATELY:  *FEVER GREATER THAN 100.5 F  *CHILLS WITH OR WITHOUT FEVER  NAUSEA AND VOMITING THAT IS NOT CONTROLLED WITH YOUR NAUSEA MEDICATION  *UNUSUAL SHORTNESS OF BREATH  *UNUSUAL BRUISING OR BLEEDING  TENDERNESS IN MOUTH AND THROAT WITH OR WITHOUT PRESENCE OF ULCERS  *URINARY PROBLEMS  *BOWEL PROBLEMS  UNUSUAL RASH Items with * indicate a potential emergency and should be followed up as soon as possible.  Feel free to call the clinic you have any questions or concerns. The clinic phone number is [phone].  Please show the [facility] at check-in to the Emergency Department and triage nurse.     Paclitaxel injection What is this medicine? PACLITAXEL (PAK li TAX el) is a chemotherapy drug. It targets fast dividing cells, like cancer cells, and causes these cells to die. This medicine is used to treat ovarian cancer, breast cancer, and other cancers. This medicine may be used for other purposes; ask your health care provider or pharmacist if you have questions. COMMON BRAND NAME(S): Onxol, Taxol What should I tell my health care provider before I take this medicine? They need to know if you have any of these conditions: -blood disorders -irregular heartbeat -infection (especially a virus infection such as chickenpox, cold sores, or herpes) -liver disease -previous or ongoing radiation therapy -an unusual or allergic reaction to paclitaxel, alcohol, polyoxyethylated castor oil, other chemotherapy agents, other medicines, foods,  dyes, or preservatives -pregnant or trying to get pregnant -breast-feeding How should I use this medicine? This drug is given as an infusion into a vein. It is administered in a hospital or clinic by a specially trained health care professional. Talk to your pediatrician regarding the use of this medicine in children. Special care may be needed. Overdosage: If you think you have taken too much of this medicine contact a poison control center or emergency room at once. NOTE: This medicine is only for you. Do not share this medicine with others. What if I miss a dose? It is important not to miss your dose. Call your doctor or health care professional if you are unable to keep an appointment. What may interact with this medicine? Do not take this medicine with any of the following medications: -disulfiram -metronidazole This medicine may also interact with the following medications: -cyclosporine -diazepam -ketoconazole -medicines to increase blood counts like filgrastim, pegfilgrastim, sargramostim -other chemotherapy drugs like cisplatin, doxorubicin, epirubicin, etoposide, teniposide, vincristine -quinidine -testosterone -vaccines -verapamil Talk to your doctor or health care professional before taking any of these medicines: -acetaminophen -aspirin -ibuprofen -ketoprofen -naproxen This list may not describe all possible interactions. Give your health care provider a list of all the medicines, herbs, non-prescription drugs, or dietary supplements you use. Also tell them if you smoke, drink alcohol, or use illegal drugs. Some items may interact with your medicine. What should I watch for while using this medicine? Your condition will be monitored carefully while you are receiving this medicine. You will need important blood work done while you are taking this medicine. This medicine can cause serious allergic reactions. To reduce your risk you  will need to take other medicine(s)  before treatment with this medicine. If you experience allergic reactions like skin rash, itching or hives, swelling of the face, lips, or tongue, tell your doctor or health care professional right away. In some cases, you may be given additional medicines to help with side effects. Follow all directions for their use. This drug may make you feel generally unwell. This is not uncommon, as chemotherapy can affect healthy cells as well as cancer cells. Report any side effects. Continue your course of treatment even though you feel ill unless your doctor tells you to stop. Call your doctor or health care professional for advice if you get a fever, chills or sore throat, or other symptoms of a cold or flu. Do not treat yourself. This drug decreases your body's ability to fight infections. Try to avoid being around people who are sick. This medicine may increase your risk to bruise or bleed. Call your doctor or health care professional if you notice any unusual bleeding. Be careful brushing and flossing your teeth or using a toothpick because you may get an infection or bleed more easily. If you have any dental work done, tell your dentist you are receiving this medicine. Avoid taking products that contain aspirin, acetaminophen, ibuprofen, naproxen, or ketoprofen unless instructed by your doctor. These medicines may hide a fever. Do not become pregnant while taking this medicine. Women should inform their doctor if they wish to become pregnant or think they might be pregnant. There is a potential for serious side effects to an unborn child. Talk to your health care professional or pharmacist for more information. Do not breast-feed an infant while taking this medicine. Men are advised not to father a child while receiving this medicine. This product may contain alcohol. Ask your pharmacist or healthcare provider if this medicine contains alcohol. Be sure to tell all healthcare providers you are taking this  medicine. Certain medicines, like metronidazole and disulfiram, can cause an unpleasant reaction when taken with alcohol. The reaction includes flushing, headache, nausea, vomiting, sweating, and increased thirst. The reaction can last from 30 minutes to several hours. What side effects may I notice from receiving this medicine? Side effects that you should report to your doctor or health care professional as soon as possible: -allergic reactions like skin rash, itching or hives, swelling of the face, lips, or tongue -low blood counts - This drug may decrease the number of white blood cells, red blood cells and platelets. You may be at increased risk for infections and bleeding. -signs of infection - fever or chills, cough, sore throat, pain or difficulty passing urine -signs of decreased platelets or bleeding - bruising, pinpoint red spots on the skin, black, tarry stools, nosebleeds -signs of decreased red blood cells - unusually weak or tired, fainting spells, lightheadedness -breathing problems -chest pain -high or low blood pressure -mouth sores -nausea and vomiting -pain, swelling, redness or irritation at the injection site -pain, tingling, numbness in the hands or feet -slow or irregular heartbeat -swelling of the ankle, feet, hands Side effects that usually do not require medical attention (report to your doctor or health care professional if they continue or are bothersome): -bone pain -complete hair loss including hair on your head, underarms, pubic hair, eyebrows, and eyelashes -changes in the color of fingernails -diarrhea -loosening of the fingernails -loss of appetite -muscle or joint pain -red flush to skin -sweating This list may not describe all possible side effects. Call your doctor for  medical advice about side effects. You may report side effects to FDA at 1-800-FDA-1088. Where should I keep my medicine? This drug is given in a hospital or clinic and will not be  stored at home. NOTE: This sheet is a summary. It may not cover all possible information. If you have questions about this medicine, talk to your doctor, pharmacist, or health care provider.  2018 Elsevier/Gold Standard (2015-06-10 19:58:00)    Carboplatin injection What is this medicine? CARBOPLATIN (KAR boe pla tin) is a chemotherapy drug. It targets fast dividing cells, like cancer cells, and causes these cells to die. This medicine is used to treat ovarian cancer and many other cancers. This medicine may be used for other purposes; ask your health care provider or pharmacist if you have questions. COMMON BRAND NAME(S): Paraplatin What should I tell my health care provider before I take this medicine? They need to know if you have any of these conditions: -blood disorders -hearing problems -kidney disease -recent or ongoing radiation therapy -an unusual or allergic reaction to carboplatin, cisplatin, other chemotherapy, other medicines, foods, dyes, or preservatives -pregnant or trying to get pregnant -breast-feeding How should I use this medicine? This drug is usually given as an infusion into a vein. It is administered in a hospital or clinic by a specially trained health care professional. Talk to your pediatrician regarding the use of this medicine in children. Special care may be needed. Overdosage: If you think you have taken too much of this medicine contact a poison control center or emergency room at once. NOTE: This medicine is only for you. Do not share this medicine with others. What if I miss a dose? It is important not to miss a dose. Call your doctor or health care professional if you are unable to keep an appointment. What may interact with this medicine? -medicines for seizures -medicines to increase blood counts like filgrastim, pegfilgrastim, sargramostim -some antibiotics like amikacin, gentamicin, neomycin, streptomycin, tobramycin -vaccines Talk to your  doctor or health care professional before taking any of these medicines: -acetaminophen -aspirin -ibuprofen -ketoprofen -naproxen This list may not describe all possible interactions. Give your health care provider a list of all the medicines, herbs, non-prescription drugs, or dietary supplements you use. Also tell them if you smoke, drink alcohol, or use illegal drugs. Some items may interact with your medicine. What should I watch for while using this medicine? Your condition will be monitored carefully while you are receiving this medicine. You will need important blood work done while you are taking this medicine. This drug may make you feel generally unwell. This is not uncommon, as chemotherapy can affect healthy cells as well as cancer cells. Report any side effects. Continue your course of treatment even though you feel ill unless your doctor tells you to stop. In some cases, you may be given additional medicines to help with side effects. Follow all directions for their use. Call your doctor or health care professional for advice if you get a fever, chills or sore throat, or other symptoms of a cold or flu. Do not treat yourself. This drug decreases your body's ability to fight infections. Try to avoid being around people who are sick. This medicine may increase your risk to bruise or bleed. Call your doctor or health care professional if you notice any unusual bleeding. Be careful brushing and flossing your teeth or using a toothpick because you may get an infection or bleed more easily. If you have any dental work  done, tell your dentist you are receiving this medicine. Avoid taking products that contain aspirin, acetaminophen, ibuprofen, naproxen, or ketoprofen unless instructed by your doctor. These medicines may hide a fever. Do not become pregnant while taking this medicine. Women should inform their doctor if they wish to become pregnant or think they might be pregnant. There is a  potential for serious side effects to an unborn child. Talk to your health care professional or pharmacist for more information. Do not breast-feed an infant while taking this medicine. What side effects may I notice from receiving this medicine? Side effects that you should report to your doctor or health care professional as soon as possible: -allergic reactions like skin rash, itching or hives, swelling of the face, lips, or tongue -signs of infection - fever or chills, cough, sore throat, pain or difficulty passing urine -signs of decreased platelets or bleeding - bruising, pinpoint red spots on the skin, black, tarry stools, nosebleeds -signs of decreased red blood cells - unusually weak or tired, fainting spells, lightheadedness -breathing problems -changes in hearing -changes in vision -chest pain -high blood pressure -low blood counts - This drug may decrease the number of white blood cells, red blood cells and platelets. You may be at increased risk for infections and bleeding. -nausea and vomiting -pain, swelling, redness or irritation at the injection site -pain, tingling, numbness in the hands or feet -problems with balance, talking, walking -trouble passing urine or change in the amount of urine Side effects that usually do not require medical attention (report to your doctor or health care professional if they continue or are bothersome): -hair loss -loss of appetite -metallic taste in the mouth or changes in taste This list may not describe all possible side effects. Call your doctor for medical advice about side effects. You may report side effects to FDA at 1-800-FDA-1088. Where should I keep my medicine? This drug is given in a hospital or clinic and will not be stored at home. NOTE: This sheet is a summary. It may not cover all possible information. If you have questions about this medicine, talk to your doctor, pharmacist, or health care provider.  2018 Elsevier/Gold  Standard (2007-11-14 14:38:05)    Hypokalemia Hypokalemia means that the amount of potassium in the blood is lower than normal.Potassium is a chemical that helps regulate the amount of fluid in the body (electrolyte). It also stimulates muscle tightening (contraction) and helps nerves work properly.Normally, most of the body's potassium is inside of cells, and only a very small amount is in the blood. Because the amount in the blood is so small, minor changes to potassium levels in the blood can be life-threatening. What are the causes? This condition may be caused by:  Antibiotic medicine.  Diarrhea or vomiting. Taking too much of a medicine that helps you have a bowel movement (laxative) can cause diarrhea and lead to hypokalemia.  Chronic kidney disease (CKD).  Medicines that help the body get rid of excess fluid (diuretics).  Eating disorders, such as bulimia.  Low magnesium levels in the body.  Sweating a lot.  What are the signs or symptoms? Symptoms of this condition include:  Weakness.  Constipation.  Fatigue.  Muscle cramps.  Mental confusion.  Skipped heartbeats or irregular heartbeat (palpitations).  Tingling or numbness.  How is this diagnosed? This condition is diagnosed with a blood test. How is this treated? Hypokalemia can be treated by taking potassium supplements by mouth or adjusting the medicines that you take.  Treatment may also include eating more foods that contain a lot of potassium. If your potassium level is very low, you may need to get potassium through an IV tube in one of your veins and be monitored in the hospital. Follow these instructions at home:  Take over-the-counter and prescription medicines only as told by your health care provider. This includes vitamins and supplements.  Eat a healthy diet. A healthy diet includes fresh fruits and vegetables, whole grains, healthy fats, and lean proteins.  If instructed, eat more foods that  contain a lot of potassium, such as: ? Nuts, such as peanuts and pistachios. ? Seeds, such as sunflower seeds and pumpkin seeds. ? Peas, lentils, and lima beans. ? Whole grain and bran cereals and breads. ? Fresh fruits and vegetables, such as apricots, avocado, bananas, cantaloupe, kiwi, oranges, tomatoes, asparagus, and potatoes. ? Orange juice. ? Tomato juice. ? Red meats. ? Yogurt.  Keep all follow-up visits as told by your health care provider. This is important. Contact a health care provider if:  You have weakness that gets worse.  You feel your heart pounding or racing.  You vomit.  You have diarrhea.  You have diabetes (diabetes mellitus) and you have trouble keeping your blood sugar (glucose) in your target range. Get help right away if:  You have chest pain.  You have shortness of breath.  You have vomiting or diarrhea that lasts for more than 2 days.  You faint. This information is not intended to replace advice given to you by your health care provider. Make sure you discuss any questions you have with your health care provider. Document Released: 08/09/2005 Document Revised: 03/27/2016 Document Reviewed: 03/27/2016 Elsevier Interactive Patient Education  2018 [employer].

## 2017-03-21 NOTE — Telephone Encounter (Signed)
The order we received on 7/19 was for pt to be evaluated for poc.  This message has to do with pt's O2 level fluctuating.  Will forward this message to triage.

## 2017-03-22 ENCOUNTER — Telehealth: Payer: Self-pay | Admitting: Medical Oncology

## 2017-03-22 NOTE — Telephone Encounter (Signed)
I left message to return call to triage or me for chemo followup and if she received portable oxygen.

## 2017-03-23 ENCOUNTER — Ambulatory Visit (INDEPENDENT_AMBULATORY_CARE_PROVIDER_SITE_OTHER): Payer: Medicaid Other | Admitting: Pulmonary Disease

## 2017-03-23 ENCOUNTER — Telehealth: Payer: Self-pay | Admitting: Medical Oncology

## 2017-03-23 ENCOUNTER — Encounter: Payer: Self-pay | Admitting: Pulmonary Disease

## 2017-03-23 ENCOUNTER — Telehealth: Payer: Self-pay | Admitting: Internal Medicine

## 2017-03-23 VITALS — BP 136/72 | HR 91 | Ht 60.0 in | Wt 171.0 lb

## 2017-03-23 DIAGNOSIS — Z510 Encounter for antineoplastic radiation therapy: Secondary | ICD-10-CM | POA: Diagnosis not present

## 2017-03-23 DIAGNOSIS — C3491 Malignant neoplasm of unspecified part of right bronchus or lung: Secondary | ICD-10-CM | POA: Diagnosis not present

## 2017-03-23 DIAGNOSIS — J439 Emphysema, unspecified: Secondary | ICD-10-CM

## 2017-03-23 DIAGNOSIS — J9611 Chronic respiratory failure with hypoxia: Secondary | ICD-10-CM | POA: Diagnosis not present

## 2017-03-23 DIAGNOSIS — R918 Other nonspecific abnormal finding of lung field: Secondary | ICD-10-CM | POA: Diagnosis not present

## 2017-03-23 DIAGNOSIS — F17200 Nicotine dependence, unspecified, uncomplicated: Secondary | ICD-10-CM | POA: Diagnosis not present

## 2017-03-23 MED ORDER — POTASSIUM CHLORIDE ER 20 MEQ PO TBCR: EXTENDED_RELEASE_TABLET | ORAL | 2 refills | Status: DC

## 2017-03-23 NOTE — Progress Notes (Signed)
Subjective:    Patient ID: Sydney Blair, female    DOB: 31-Jul-1956, 61 y.o.   MRN: [id number]   Synopsis: Referred back to the pulmonary clinic in 2018 for evaluation of an abnormal chest x-ray. Has a history of COPD and was previously seen by Dr. Gwenette Greet.  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  PDL-1 expression was negative.  She was started on 2 L of oxygen continuously in July 2018.   HPI Chief Complaint  Patient presents with  . Follow-up    pt states she is doing well, does note some sob with exertion.  received POC yesterday, doing well with it.     [name] has been using and benefitting from her oxygen and loves her new POC.  She has seen Dr. Julien Nordmann and Dr. Sondra Come.  Her first dose of Chemo was on 7/30.  She had some diarrhea and cramps.  She is keeping her spirits up.  Her breathing is doing OK right now.  She has a little cough and some wheeze, but not too bad.  She coughs up a little mucus every now and then.  Her weight has been stable.   She says she is doing her best to stay away from cigarettes right now.   Past Medical History:  Diagnosis Date  . Anxiety   . ARF (acute renal failure) ([facility]) 08/07/2011   ?   [facility] Arthritis   . 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 Chronic back pain   . COPD (chronic obstructive pulmonary disease) ([facility])   . Depression   . Depression with anxiety   . Dyspnea    on exertion  . Fibromyalgia   . GERD (gastroesophageal reflux disease)   . Hypercholesteremia   . Hypothyroidism   . Incontinence   . Neuropathy   . Peripheral neuropathy   . Sciatic pain   . Sebaceous cyst    lt labial location  . Sleep apnea    uses CPAP  . Stage III squamous cell carcinoma of right lung ([facility]) 03/10/2017  . Tobacco abuse        Review of Systems  Constitutional: Negative for fever and unexpected weight change.  HENT: Negative for nosebleeds, postnasal drip, rhinorrhea, sinus  pressure, sneezing and sore throat.   Eyes: Negative for redness and itching.  Respiratory: Positive for cough. Negative for chest tightness, shortness of breath and wheezing.   Cardiovascular: Negative for palpitations and leg swelling.       Objective:   Physical Exam Vitals:   03/23/17 0927  BP: 136/72  Pulse: 91  SpO2: 95%  Weight: 171 lb (77.6 kg)  Height: 5' (1.524 m)    Gen: well appearing HENT: OP clear, TM's clear, neck supple PULM: CTA B, normal percussion CV: RRR, no mgr, trace edema GI: BS+, soft, nontender Derm: no cyanosis or rash Psyche: normal mood and affect   Oncology records reviewed, they plan carboplanin and paclitaxel with radiation therapy.  PFT: PFT's 12/2012:  FEV1 1.34 (53%), ratio 63, TLC normal, +airtrapping, DLCO 52%.    Labs: AAT 11/2012:  Normal level, MM  Chest imaging: 2018 chest x-ray images independently reviewed showing a right upper lobe mass.     Assessment & Plan:  Pulmonary emphysema, unspecified emphysema type ([facility])  Lung mass  Squamous cell carcinoma of right lung ([facility])  TOBACCO ABUSE  Chronic respiratory failure with hypoxia ([facility])  Discussion: Despite the  recent diagnosis of squamous cell carcinoma this is been a stable interval for her. She is staying away from cigarettes. Her COPD has been stable. She is compliant with her new prescription of 2 L of oxygen continuously.  I explained to her that during her cancer treatment I like to keep a closer eye on her than normal. Site 1 her to come back in 2 months and see a nurse practitioner, then she can come back and see me 2 months after that.  Plan: For lung cancer: Continue follow-up with the oncology clinic for chemotherapy and radiation therapy  For COPD: Continue taking Symbicort as you are doing Get a flu shot when they become available next few weeks Practice good hand hygiene  For chronic respiratory failure with hypoxemia: Continue using 2 L of oxygen  continuously  For tobacco abuse: Continue to stay away from cigarettes  We will see you back in 2 months with a nurse practitioner    Current Outpatient Prescriptions:  .  albuterol (PROVENTIL HFA;VENTOLIN HFA) 108 (90 BASE) MCG/ACT inhaler, Inhale 2 puffs into the lungs every 6 (six) hours as needed for wheezing or shortness of breath., Disp: , Rfl:  .  aspirin 81 MG chewable tablet, Chew 81 mg by mouth daily., Disp: , Rfl:  .  budesonide-formoterol (SYMBICORT) 160-4.5 MCG/ACT inhaler, Inhale 2 puffs into the lungs daily. , Disp: , Rfl:  .  CALCIUM PO, Take 1 tablet by mouth daily., Disp: , Rfl:  .  cetirizine (ZYRTEC) 10 MG tablet, Take 10 mg by mouth daily., Disp: , Rfl:  .  dexlansoprazole (DEXILANT) 60 MG capsule, Take 1 capsule (60 mg total) by mouth daily before breakfast., Disp: 30 capsule, Rfl: 11 .  diclofenac sodium (VOLTAREN) 1 % GEL, Apply 4 g topically 4 (four) times daily., Disp: , Rfl:  .  DULoxetine (CYMBALTA) 60 MG capsule, Take 60 mg by mouth daily., Disp: , Rfl:  .  fluticasone (FLONASE) 50 MCG/ACT nasal spray, Place 2 sprays into both nostrils daily., Disp: , Rfl:  .  gabapentin (NEURONTIN) 800 MG tablet, Take 800 mg by mouth 4 (four) times daily., Disp: , Rfl: 2 .  ibuprofen (ADVIL,MOTRIN) 800 MG tablet, Take 800 mg by mouth every 8 (eight) hours as needed for moderate pain. , Disp: , Rfl:  .  levothyroxine (SYNTHROID, LEVOTHROID) 100 MCG tablet, Take 100 mcg by mouth daily before breakfast. Reported on 02/03/2016, Disp: , Rfl:  .  meclizine (ANTIVERT) 25 MG tablet, Take 25 mg by mouth daily., Disp: , Rfl:  .  methocarbamol (ROBAXIN) 500 MG tablet, Take 500 mg by mouth 4 (four) times daily. , Disp: , Rfl:  .  nortriptyline (PAMELOR) 25 MG capsule, Take 25 mg by mouth at bedtime., Disp: , Rfl:  .  olopatadine (PATANOL) 0.1 % ophthalmic solution, Place 1 drop into both eyes 2 (two) times daily. , Disp: , Rfl:  .  pravastatin (PRAVACHOL) 40 MG tablet, Take 40 mg by mouth  every evening., Disp: , Rfl:  .  prochlorperazine (COMPAZINE) 10 MG tablet, Take 1 tablet (10 mg total) by mouth every 6 (six) hours as needed for nausea or vomiting., Disp: 30 tablet, Rfl: 0 .  Tapentadol HCl (NUCYNTA) 100 MG TABS, Take 100 mg by mouth 2 (two) times daily., Disp: , Rfl:  .  tiotropium (SPIRIVA) 18 MCG inhalation capsule, Place 1 capsule (18 mcg total) into inhaler and inhale daily., Disp: 30 capsule, Rfl: 6 .  TRINTELLIX 10 MG TABS, Take 10  mg by mouth daily., Disp: , Rfl: 5 .  zolpidem (AMBIEN) 10 MG tablet, Take 10 mg by mouth at bedtime as needed for sleep., Disp: , Rfl:  .  Potassium Chloride ER 20 MEQ TBCR, Take 40 mEq by mouth daily., Disp: 20 tablet, Rfl: 0

## 2017-03-23 NOTE — Patient Instructions (Signed)
For lung cancer: Continue follow-up with the oncology clinic for chemotherapy and radiation therapy  For COPD: Continue taking Symbicort as you are doing Get a flu shot when they become available next few weeks Practice good hand hygiene  For chronic respiratory failure with hypoxemia: Continue using 2 L of oxygen continuously  For tobacco abuse: Continue to stay away from cigarettes  We will see you back in 2 months with a nurse practitioner

## 2017-03-23 NOTE — Telephone Encounter (Signed)
Left message for patient re next appointments for 8/6. Patient to get updated schedule at 8/3 xrt visit.

## 2017-03-23 NOTE — Telephone Encounter (Signed)
  Left message for  patient and or family to increase K+ rich foods in diet including: bananas,oranges, avocado, squash, raisins, potatoes, cabbage,cauliflower, tuna,honey,beef,chicken,sardines,milk,turkey.

## 2017-03-24 ENCOUNTER — Ambulatory Visit
Admission: RE | Admit: 2017-03-24 | Discharge: 2017-03-24 | Disposition: A | Discharge status: Home / Self Care | Payer: Medicaid Other | Source: Ambulatory Visit | Attending: Radiation Oncology | Admitting: Radiation Oncology

## 2017-03-24 DIAGNOSIS — C3491 Malignant neoplasm of unspecified part of right bronchus or lung: Secondary | ICD-10-CM

## 2017-03-24 DIAGNOSIS — Z510 Encounter for antineoplastic radiation therapy: Secondary | ICD-10-CM | POA: Diagnosis not present

## 2017-03-24 NOTE — Progress Notes (Signed)
  Radiation Oncology         [phone]) [phone] ________________________________  Name: Sydney Blair MRN: [id number]  Date: 03/24/2017  DOB: 03/25/1956  Simulation Verification Note    ICD-10-CM   1. 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 The clinical setup was verified. Then port films were obtained and uploaded to the radiation oncology medical record software.  The treatment beams were carefully compared against the planned radiation fields. The position location and shape of the radiation fields was reviewed. They targeted volume of tissue appears to be appropriately covered by the radiation beams. Organs at risk appear to be excluded as planned.  Based on my personal review, I approved the simulation verification. The patient's treatment will proceed as planned.  -----------------------------------  [name], PhD, MD

## 2017-03-25 ENCOUNTER — Ambulatory Visit
Admission: RE | Admit: 2017-03-25 | Discharge: 2017-03-25 | Disposition: A | Discharge status: Home / Self Care | Payer: Medicaid Other | Source: Ambulatory Visit | Attending: Radiation Oncology | Admitting: Radiation Oncology

## 2017-03-25 ENCOUNTER — Telehealth: Payer: Self-pay

## 2017-03-25 DIAGNOSIS — Z510 Encounter for antineoplastic radiation therapy: Secondary | ICD-10-CM | POA: Diagnosis not present

## 2017-03-25 NOTE — Telephone Encounter (Signed)
Opened in error

## 2017-03-28 ENCOUNTER — Ambulatory Visit: Payer: Medicaid Other | Admitting: Radiation Oncology

## 2017-03-28 ENCOUNTER — Encounter: Payer: Self-pay | Admitting: Oncology

## 2017-03-28 ENCOUNTER — Ambulatory Visit (HOSPITAL_BASED_OUTPATIENT_CLINIC_OR_DEPARTMENT_OTHER): Payer: Medicaid Other | Admitting: Oncology

## 2017-03-28 ENCOUNTER — Ambulatory Visit (HOSPITAL_BASED_OUTPATIENT_CLINIC_OR_DEPARTMENT_OTHER): Payer: Medicaid Other

## 2017-03-28 ENCOUNTER — Other Ambulatory Visit (HOSPITAL_BASED_OUTPATIENT_CLINIC_OR_DEPARTMENT_OTHER): Payer: Medicaid Other

## 2017-03-28 ENCOUNTER — Ambulatory Visit
Admission: RE | Admit: 2017-03-28 | Discharge: 2017-03-28 | Disposition: A | Discharge status: Home / Self Care | Payer: Medicaid Other | Source: Ambulatory Visit | Attending: Radiation Oncology | Admitting: Radiation Oncology

## 2017-03-28 VITALS — BP 126/80 | HR 62 | Temp 98.4°F | Resp 18 | Ht 60.0 in | Wt 169.6 lb

## 2017-03-28 DIAGNOSIS — Z5111 Encounter for antineoplastic chemotherapy: Secondary | ICD-10-CM

## 2017-03-28 DIAGNOSIS — C3411 Malignant neoplasm of upper lobe, right bronchus or lung: Secondary | ICD-10-CM

## 2017-03-28 DIAGNOSIS — Z720 Tobacco use: Secondary | ICD-10-CM

## 2017-03-28 DIAGNOSIS — Z510 Encounter for antineoplastic radiation therapy: Secondary | ICD-10-CM | POA: Diagnosis not present

## 2017-03-28 DIAGNOSIS — F17200 Nicotine dependence, unspecified, uncomplicated: Secondary | ICD-10-CM

## 2017-03-28 DIAGNOSIS — C3491 Malignant neoplasm of unspecified part of right bronchus or lung: Secondary | ICD-10-CM

## 2017-03-28 DIAGNOSIS — Z7189 Other specified counseling: Secondary | ICD-10-CM

## 2017-03-28 DIAGNOSIS — G620 Drug-induced polyneuropathy: Secondary | ICD-10-CM | POA: Diagnosis not present

## 2017-03-28 DIAGNOSIS — J439 Emphysema, unspecified: Secondary | ICD-10-CM

## 2017-03-28 LAB — CBC WITH DIFFERENTIAL/PLATELET
BASO%: 0.2 % (ref 0.0–2.0)
Basophils Absolute: 0 10*3/uL (ref 0.0–0.1)
EOS ABS: 0.1 10*3/uL (ref 0.0–0.5)
EOS%: 0.9 % (ref 0.0–7.0)
HCT: 38.7 % (ref 34.8–46.6)
HEMOGLOBIN: 11.8 g/dL (ref 11.6–15.9)
LYMPH%: 13.7 % — ABNORMAL LOW (ref 14.0–49.7)
MCH: 25.8 pg (ref 25.1–34.0)
MCHC: 30.5 g/dL — ABNORMAL LOW (ref 31.5–36.0)
MCV: 84.5 fL (ref 79.5–101.0)
MONO#: 0.5 10*3/uL (ref 0.1–0.9)
MONO%: 5.7 % (ref 0.0–14.0)
NEUT%: 79.5 % — ABNORMAL HIGH (ref 38.4–76.8)
NEUTROS ABS: 6.5 10*3/uL (ref 1.5–6.5)
Platelets: 245 10*3/uL (ref 145–400)
RBC: 4.58 10*6/uL (ref 3.70–5.45)
RDW: 16.6 % — AB (ref 11.2–14.5)
WBC: 8.2 10*3/uL (ref 3.9–10.3)
lymph#: 1.1 10*3/uL (ref 0.9–3.3)

## 2017-03-28 LAB — COMPREHENSIVE METABOLIC PANEL
ALBUMIN: 3 g/dL — AB (ref 3.5–5.0)
ALK PHOS: 92 U/L (ref 40–150)
ALT: 7 U/L (ref 0–55)
AST: 13 U/L (ref 5–34)
Anion Gap: 8 mEq/L (ref 3–11)
BILIRUBIN TOTAL: 0.83 mg/dL (ref 0.20–1.20)
BUN: 7.5 mg/dL (ref 7.0–26.0)
CO2: 30 meq/L — AB (ref 22–29)
Calcium: 9 mg/dL (ref 8.4–10.4)
Chloride: 97 mEq/L — ABNORMAL LOW (ref 98–109)
Creatinine: 0.7 mg/dL (ref 0.6–1.1)
EGFR: >90 mL/min/{1.73_m2} (ref >90)
GLUCOSE: 90 mg/dL (ref 70–140)
Potassium: 4 mEq/L (ref 3.5–5.1)
SODIUM: 135 meq/L — AB (ref 136–145)
TOTAL PROTEIN: 7 g/dL (ref 6.4–8.3)

## 2017-03-28 MED ORDER — PROCHLORPERAZINE MALEATE 10 MG PO TABS: 10.0000 mg | ORAL_TABLET | Freq: Four times a day (QID) | ORAL | 1 refills | Status: DC | PRN

## 2017-03-28 MED ORDER — PACLITAXEL CHEMO INJECTION 300 MG/50ML
45.0000 mg/m2 | Freq: Once | INTRAVENOUS | Status: AC
  Administered 2017-03-28: 84 mg via INTRAVENOUS
  Filled 2017-03-28: qty 14

## 2017-03-28 MED ORDER — SODIUM CHLORIDE 0.9 % IV SOLN
237.0000 mg | Freq: Once | INTRAVENOUS | Status: AC
  Administered 2017-03-28: 240 mg via INTRAVENOUS
  Filled 2017-03-28: qty 24

## 2017-03-28 MED ORDER — FAMOTIDINE IN NACL 20-0.9 MG/50ML-% IV SOLN
20.0000 mg | Freq: Once | INTRAVENOUS | Status: AC
  Administered 2017-03-28: 20 mg via INTRAVENOUS

## 2017-03-28 MED ORDER — DIPHENHYDRAMINE HCL 50 MG/ML IJ SOLN
INTRAMUSCULAR | Status: AC
  Filled 2017-03-28: qty 1

## 2017-03-28 MED ORDER — SODIUM CHLORIDE 0.9 % IV SOLN
Freq: Once | INTRAVENOUS | Status: AC
  Administered 2017-03-28: 10:00:00 via INTRAVENOUS

## 2017-03-28 MED ORDER — DIPHENHYDRAMINE HCL 50 MG/ML IJ SOLN
50.0000 mg | Freq: Once | INTRAMUSCULAR | Status: AC
  Administered 2017-03-28: 50 mg via INTRAVENOUS

## 2017-03-28 MED ORDER — SODIUM CHLORIDE 0.9 % IV SOLN
20.0000 mg | Freq: Once | INTRAVENOUS | Status: AC
  Administered 2017-03-28: 20 mg via INTRAVENOUS
  Filled 2017-03-28: qty 2

## 2017-03-28 MED ORDER — PALONOSETRON HCL INJECTION 0.25 MG/5ML
0.2500 mg | Freq: Once | INTRAVENOUS | Status: AC
  Administered 2017-03-28: 0.25 mg via INTRAVENOUS

## 2017-03-28 MED ORDER — FAMOTIDINE IN NACL 20-0.9 MG/50ML-% IV SOLN
INTRAVENOUS | Status: AC
  Filled 2017-03-28: qty 50

## 2017-03-28 MED ORDER — PALONOSETRON HCL INJECTION 0.25 MG/5ML
INTRAVENOUS | Status: AC
  Filled 2017-03-28: qty 5

## 2017-03-28 NOTE — Progress Notes (Signed)
[facility] SHARED VISIT PROGRESS NOTE  [name], MD [place] [address]  DIAGNOSIS: Stage IIIa (T2b, N2, M0) non-small cell lung cancer, squamous cell carcinoma presented with large right upper lobe lung mass in addition to right hilar and mediastinal lymphadenopathy diagnosed in July 2018. PDL 1 expression is negative  PRIOR THERAPY: None  CURRENT THERAPY: Current chemoradiation with weekly carboplatin for an AUC of 2 and paclitaxel 45 mg meter squared. First dose was given on 03/21/2017. Status post one cycle of her chemotherapy.  INTERVAL HISTORY: CAMBRY SPAMPINATO 61 y.o. female returns for routine follow-up to evaluate side effects from her first chemotherapy and for evaluation prior to her second dose of chemotherapy. The patient tolerated her first cycle of chemotherapy well with the exception of nausea. She does take Compazine as needed. She has not had any vomiting. She is still able to eat and drink without any difficulty. She has not had any weight loss. The patient has her dry cough which is unchanged. She denied hemoptysis. Denies constipation or diarrhea. She has her baseline shortness of breath which increases with exertion. She remains on oxygen 2 L/m. Denies chest pain. She has neuropathy which is unchanged. The patient continues to smoke. The patient is here for evaluation prior to cycle 2 of her chemotherapy and to review her MRI of the brain results.  MEDICAL HISTORY: Past Medical History:  Diagnosis Date  . Anxiety   . ARF (acute renal failure) ([facility]) 08/07/2011   ?   Marland Kitchen Arthritis   . Cancer ([facility]) 2005   breast-right  . Chronic back pain   . COPD (chronic obstructive pulmonary disease) ([facility])   . Depression   . Depression with anxiety   . Dyspnea    on exertion  . Fibromyalgia   . GERD (gastroesophageal reflux disease)   . Hypercholesteremia   . Hypothyroidism   . Incontinence   . Neuropathy   . Peripheral neuropathy    . Sciatic pain   . Sebaceous cyst    lt labial location  . Sleep apnea    uses CPAP  . Stage III squamous cell carcinoma of right lung ([facility]) 03/10/2017  . Tobacco abuse     ALLERGIES:  is allergic to penicillins.  MEDICATIONS:  Current Outpatient Prescriptions  Medication Sig Dispense Refill  . albuterol (PROVENTIL HFA;VENTOLIN HFA) 108 (90 BASE) MCG/ACT inhaler Inhale 2 puffs into the lungs every 6 (six) hours as needed for wheezing or shortness of breath.    Marland Kitchen aspirin 81 MG chewable tablet Chew 81 mg by mouth daily.    . budesonide-formoterol (SYMBICORT) 160-4.5 MCG/ACT inhaler Inhale 2 puffs into the lungs daily.     Marland Kitchen CALCIUM PO Take 1 tablet by mouth daily.    . cetirizine (ZYRTEC) 10 MG tablet Take 10 mg by mouth daily.    Marland Kitchen dexlansoprazole (DEXILANT) 60 MG capsule Take 1 capsule (60 mg total) by mouth daily before breakfast. 30 capsule 11  . diclofenac sodium (VOLTAREN) 1 % GEL Apply 4 g topically 4 (four) times daily.    . DULoxetine (CYMBALTA) 60 MG capsule Take 60 mg by mouth daily.    . fluticasone (FLONASE) 50 MCG/ACT nasal spray Place 2 sprays into both nostrils daily.    Marland Kitchen gabapentin (NEURONTIN) 800 MG tablet Take 800 mg by mouth 4 (four) times daily.  2  . ibuprofen (ADVIL,MOTRIN) 800 MG tablet Take 800 mg by mouth every 8 (eight) hours as needed for moderate  pain.     . levothyroxine (SYNTHROID, LEVOTHROID) 100 MCG tablet Take 100 mcg by mouth daily before breakfast. Reported on 02/03/2016    . meclizine (ANTIVERT) 25 MG tablet Take 25 mg by mouth daily.    . methocarbamol (ROBAXIN) 500 MG tablet Take 500 mg by mouth 4 (four) times daily.     . nortriptyline (PAMELOR) 25 MG capsule Take 25 mg by mouth at bedtime.    Marland Kitchen olopatadine (PATANOL) 0.1 % ophthalmic solution Place 1 drop into both eyes 2 (two) times daily.     . Potassium Chloride ER 20 MEQ TBCR Take 1 tablet on Monday, Wednesday, and Friday. 30 tablet 2  . pravastatin (PRAVACHOL) 40 MG tablet Take 40 mg by mouth  every evening.    . prochlorperazine (COMPAZINE) 10 MG tablet Take 1 tablet (10 mg total) by mouth every 6 (six) hours as needed for nausea or vomiting. 30 tablet 1  . Tapentadol HCl (NUCYNTA) 100 MG TABS Take 100 mg by mouth 2 (two) times daily.    Marland Kitchen tiotropium (SPIRIVA) 18 MCG inhalation capsule Place 1 capsule (18 mcg total) into inhaler and inhale daily. 30 capsule 6  . TRINTELLIX 10 MG TABS Take 10 mg by mouth daily.  5  . zolpidem (AMBIEN) 10 MG tablet Take 10 mg by mouth at bedtime as needed for sleep.     No current facility-administered medications for this visit.    Facility-Administered Medications Ordered in Other Visits  Medication Dose Route Frequency Provider Last Rate Last Dose  . CARBOplatin (PARAPLATIN) 240 mg in sodium chloride 0.9 % 250 mL chemo infusion  240 mg Intravenous Once Curt Bears, MD      . dexamethasone (DECADRON) 20 mg in sodium chloride 0.9 % 50 mL IVPB  20 mg Intravenous Once Curt Bears, MD   20 mg at 03/28/17 1104  . PACLitaxel (TAXOL) 84 mg in dextrose 5 % 250 mL chemo infusion (</= 80mg /m2)  45 mg/m2 (Treatment Plan Recorded) Intravenous Once Curt Bears, MD        SURGICAL HISTORY:  Past Surgical History:  Procedure Laterality Date  . bladder tack    . BREAST BIOPSY Right 12/29/2016   Procedure: RIGHT BREAST BIOPSY;  Surgeon: Aviva Signs, MD;  Location: [facility];  Service: General;  Laterality: Right;  . BREAST LUMPECTOMY     right with node dissection  . COLONOSCOPY  11/11/2010   FIE:PPIRJJOACZ rectal polyp and splenic flexure otherwise normal; pathology showed  tubular adenomas. Next TCS 10/2015.  Marland Kitchen ESOPHAGOGASTRODUODENOSCOPY  11/11/2010   YSA:YTKZS hiatal hernia, 58 French dilator  . ESOPHAGOGASTRODUODENOSCOPY (EGD) WITH ESOPHAGEAL DILATION N/A 06/21/2013   [facility]U:XNATFT esophagus  - status post Maloney dilation s/p bx (benign esophageal bx)  . VIDEO BRONCHOSCOPY WITH ENDOBRONCHIAL ULTRASOUND N/A 02/28/2017   Procedure: VIDEO BRONCHOSCOPY  WITH ENDOBRONCHIAL ULTRASOUND;  Surgeon: Juanito Doom, MD;  Location: [facility];  Service: Thoracic;  Laterality: N/A;    REVIEW OF SYSTEMS:  Review of Systems  Constitutional: Positive for malaise/fatigue. Negative for chills, fever and weight loss.  HENT: Negative.   Eyes: Negative.   Respiratory: Positive for cough, shortness of breath and wheezing. Negative for hemoptysis.   Cardiovascular: Negative.   Gastrointestinal: Positive for nausea. Negative for abdominal pain, constipation, diarrhea and vomiting.  Genitourinary: Negative.   Musculoskeletal: Negative.   Skin: Negative.   Neurological: Negative.   Endo/Heme/Allergies: Negative.   Psychiatric/Behavioral: Negative.      PHYSICAL EXAMINATION: Physical Exam  Constitutional: She is oriented  to person, place, and time and well-developed, well-nourished, and in no distress. No distress.  HENT:  Head: Normocephalic and atraumatic.  Mouth/Throat: Oropharynx is clear and moist. No oropharyngeal exudate.  Eyes: Conjunctivae are normal. No scleral icterus.  Neck: Normal range of motion. Neck supple. No JVD present. No tracheal deviation present.  Cardiovascular: Normal rate, regular rhythm and normal heart sounds.   Pulmonary/Chest: Effort normal. She has wheezes.  Wheezes that clear with cough.  Abdominal: Soft. Bowel sounds are normal. She exhibits no distension. There is no tenderness. There is no rebound.  Musculoskeletal: Normal range of motion. She exhibits no edema.  Lymphadenopathy:    She has no cervical adenopathy.  Neurological: She is alert and oriented to person, place, and time. She exhibits normal muscle tone. Gait normal.  Skin: Skin is warm and dry. No rash noted. She is not diaphoretic. No erythema. No pallor.  Psychiatric: Mood, memory, affect and judgment normal.  Vitals reviewed.   ECOG PERFORMANCE STATUS: 1 - Symptomatic but completely ambulatory  Blood pressure 126/80, pulse 62, temperature 98.4 F  (36.9 C), temperature source Oral, resp. rate 18, height 5' (1.524 m), weight 169 lb 9.6 oz (76.9 kg), SpO2 92 %.  LABORATORY DATA: Lab Results  Component Value Date   WBC 8.2 03/28/2017   HGB 11.8 03/28/2017   HCT 38.7 03/28/2017   MCV 84.5 03/28/2017   PLT 245 03/28/2017      Chemistry      Component Value Date/Time   NA 135 (L) 03/28/2017 0915   K 4.0 03/28/2017 0915   CL 99 02/25/2017 1234   CO2 30 (H) 03/28/2017 0915   BUN 7.5 03/28/2017 0915   CREATININE 0.7 03/28/2017 0915      Component Value Date/Time   CALCIUM 9.0 03/28/2017 0915   ALKPHOS 92 03/28/2017 0915   AST 13 03/28/2017 0915   ALT 7 03/28/2017 0915   BILITOT 0.83 03/28/2017 0915       RADIOGRAPHIC STUDIES:  Mr Jeri Cos TK Contrast  Result Date: 03/18/2017 CLINICAL DATA:  Squamous cell carcinoma right lung. Tobacco use. Staging for metastatic disease EXAM: MRI HEAD WITHOUT AND WITH CONTRAST TECHNIQUE: Multiplanar, multiecho pulse sequences of the brain and surrounding structures were obtained without and with intravenous contrast. CONTRAST:  28mL MULTIHANCE GADOBENATE DIMEGLUMINE 529 MG/ML IV SOLN COMPARISON:  CT head 08/06/2011 FINDINGS: Brain: Image quality degraded by motion. In addition, IV access was difficult and only a partial injection of contrast was possible. Additional attempts at IV access were not successful. Ventricle size normal. Mild cortical atrophy. Small areas of restricted diffusion in the cerebellum bilaterally and in the right frontal white matter. These do not enhance and are most likely due to small infarcts. Close follow-up recommended as metastatic disease can show restricted diffusion. Negative for hemorrhage mass or edema. No enhancing lesion identified however postcontrast images are degraded by motion. Vascular: Normal arterial flow void.  Normal venous enhancement Skull and upper cervical spine: Negative Sinuses/Orbits: Negative Other: None IMPRESSION: Small areas of restricted  diffusion in the cerebellum bilaterally and in the right frontal white matter. These areas do not enhance and are most likely due to recent infarcts. Short-term follow-up MRI suggested to exclude the possibility of metastatic disease in these areas. No enhancing lesions Image quality degraded by motion and partial injection of contrast. These results will be called to the ordering clinician or representative by the Radiologist Assistant, and communication documented in the PACS or zVision Dashboard. Electronically Signed  By: Franchot Gallo M.D.   On: 03/18/2017 09:59     ASSESSMENT/PLAN:  No problem-specific Assessment & Plan notes found for this encounter. This is a 61 year old female with stage IIIa (T2b, N2, M0) non-small cell lung cancer, squamous cell carcinoma presented with large right upper lobe lung mass in addition to right hilar and mediastinal lymphadenopathy diagnosed in July 2018. PDL 1 expression is negative. Patient is currently undergoing a course of concurrent chemoradiation with weekly carboplatin for an AUC of 2 and paclitaxel 45 mg meter squared. Tolerating this well with the exception of nausea.  Patient was seen with Dr. Julien Nordmann. Recommend that she proceed with cycle 2 of her chemotherapy as scheduled today. The patient was encouraged to take her Compazine on a regular basis. She may use ginger ale in addition to her antiemetic.  MRI of the brain results were discussed with the patient. There is no evidence of brain metastasis. Discussed that there does appear to be evidence of an old infarct. We will follow up on the MRI when we restage her with a CT scan of the chest after her chemoradiation is complete to be sure there is no evidence of metastatic disease in those areas.  The patient return next week for chemotherapy and in 2 weeks she will have a visit with her chemotherapy.  All questions were answered. The patient knows to call the clinic with any problems, questions or  concerns. We can certainly see the patient much sooner if necessary.  Mikey Bussing, DNP, AGPCNP-BC, AOCNP 03/28/17  ADDENDUM: Hematology/Oncology Attending: I had a face to face encounter with the patient today. I recommended her care plan. This is a very pleasant 61 years old white female recently diagnosed with a stage IIIa non-small cell lung cancer, squamous cell carcinoma. The patient is currently undergoing treatment with concurrent chemoradiation with weekly carboplatin and paclitaxel is status post 2 cycles. She has been tolerating this treatment well except for mild nausea. I advised her to take her nausea medication more frequently. She had MRI of the brain performed recently that showed no clear evidence for brain metastasis but there was questionable history of stroke. We will continue to monitor this closely and consider the patient for repeat MRI of the brain with the upcoming restaging scan of the chest. She would come back for follow-up visit in 2 weeks for reevaluation and management any adverse effect of her treatment. The patient was advised to call immediately if she has any concerning symptoms in the interval. Disclaimer: This note was dictated with voice recognition software. Similar sounding words can inadvertently be transcribed and may be missed upon review. Eilleen Kempf., MD 03/28/17

## 2017-03-28 NOTE — Patient Instructions (Signed)
Cancer Center Discharge Instructions for Patients Receiving Chemotherapy  Today you received the following chemotherapy agents Taxol and Carboplatin.  To help prevent nausea and vomiting after your treatment, we encourage you to take your nausea medication as prescribed.   If you develop nausea and vomiting that is not controlled by your nausea medication, call the clinic.   BELOW ARE SYMPTOMS THAT SHOULD BE REPORTED IMMEDIATELY:  *FEVER GREATER THAN 100.5 F  *CHILLS WITH OR WITHOUT FEVER  NAUSEA AND VOMITING THAT IS NOT CONTROLLED WITH YOUR NAUSEA MEDICATION  *UNUSUAL SHORTNESS OF BREATH  *UNUSUAL BRUISING OR BLEEDING  TENDERNESS IN MOUTH AND THROAT WITH OR WITHOUT PRESENCE OF ULCERS  *URINARY PROBLEMS  *BOWEL PROBLEMS  UNUSUAL RASH Items with * indicate a potential emergency and should be followed up as soon as possible.  Feel free to call the clinic you have any questions or concerns. The clinic phone number is [phone].  Please show the CHEMO ALERT CARD at check-in to the Emergency Department and triage nurse.   

## 2017-03-29 ENCOUNTER — Ambulatory Visit: Payer: Medicaid Other | Admitting: Internal Medicine

## 2017-03-29 ENCOUNTER — Ambulatory Visit
Admission: RE | Admit: 2017-03-29 | Discharge: 2017-03-29 | Disposition: A | Discharge status: Home / Self Care | Payer: Medicaid Other | Source: Ambulatory Visit | Attending: Radiation Oncology | Admitting: Radiation Oncology

## 2017-03-29 ENCOUNTER — Other Ambulatory Visit: Payer: Medicaid Other

## 2017-03-29 DIAGNOSIS — C3491 Malignant neoplasm of unspecified part of right bronchus or lung: Secondary | ICD-10-CM

## 2017-03-29 DIAGNOSIS — Z510 Encounter for antineoplastic radiation therapy: Secondary | ICD-10-CM | POA: Diagnosis not present

## 2017-03-29 MED ORDER — SONAFINE EX EMUL
1.0000 "application " | Freq: Once | CUTANEOUS | Status: AC
  Administered 2017-03-29: 1 via TOPICAL

## 2017-03-29 NOTE — Progress Notes (Signed)
Pt here for patient teaching.  Pt given Radiation and You booklet and Sonafine.  Reviewed areas of pertinence such as fatigue, skin changes and throat changes . Pt able to give teach back of to pat skin and use unscented/gentle soap,apply Sonafine bid and avoid applying anything to skin within 4 hours of treatment. Pt demonstrated understanding and verbalizes understanding of information given and will contact nursing with any questions or concerns.

## 2017-03-30 ENCOUNTER — Ambulatory Visit
Admission: RE | Admit: 2017-03-30 | Discharge: 2017-03-30 | Disposition: A | Discharge status: Home / Self Care | Payer: Medicaid Other | Source: Ambulatory Visit | Attending: Radiation Oncology | Admitting: Radiation Oncology

## 2017-03-30 DIAGNOSIS — Z510 Encounter for antineoplastic radiation therapy: Secondary | ICD-10-CM | POA: Diagnosis not present

## 2017-03-31 ENCOUNTER — Ambulatory Visit
Admission: RE | Admit: 2017-03-31 | Discharge: 2017-03-31 | Disposition: A | Discharge status: Home / Self Care | Payer: Medicaid Other | Source: Ambulatory Visit | Attending: Radiation Oncology | Admitting: Radiation Oncology

## 2017-03-31 DIAGNOSIS — Z510 Encounter for antineoplastic radiation therapy: Secondary | ICD-10-CM | POA: Diagnosis not present

## 2017-04-01 ENCOUNTER — Telehealth: Payer: Self-pay | Admitting: Pulmonary Disease

## 2017-04-01 ENCOUNTER — Telehealth: Payer: Self-pay | Admitting: Medical Oncology

## 2017-04-01 ENCOUNTER — Ambulatory Visit: Admission: RE | Admit: 2017-04-01 | Payer: Medicaid Other | Source: Ambulatory Visit

## 2017-04-01 ENCOUNTER — Other Ambulatory Visit: Payer: Self-pay | Admitting: Medical Oncology

## 2017-04-01 DIAGNOSIS — Z5111 Encounter for antineoplastic chemotherapy: Secondary | ICD-10-CM

## 2017-04-01 MED ORDER — ONDANSETRON HCL 8 MG PO TABS: 8.0000 mg | ORAL_TABLET | Freq: Three times a day (TID) | ORAL | 0 refills | Status: DC | PRN

## 2017-04-01 NOTE — Telephone Encounter (Signed)
Called and spoke with pt and she stated that she has been using the compazine for about 3 weeks and this is not helping.  She is wanting to see if she can get something else called in.  BQ is out of the office today.  Will forward to DOD.  Please advise MW.  Thanks   Allergies  Allergen Reactions  . Penicillins Anaphylaxis and Other (See Comments)    Has patient had a PCN reaction causing immediate rash, facial/tongue/throat swelling, SOB or lightheadedness with hypotension:Yes Has patient had a PCN reaction causing severe rash involving mucus membranes or skin necrosis:Yes Has patient had a PCN reaction that required hospitalization:No Has patient had a PCN reaction occurring within the last 10 years:No If all of the above answers are "NO", then may proceed with Cephalosporin use.

## 2017-04-01 NOTE — Telephone Encounter (Signed)
Pt states she has persistent nausea not controlled with compazine for several weeks now. Per Julien Nordmann zofran escribed to local pharmacy.

## 2017-04-01 NOTE — Telephone Encounter (Signed)
This is not a pulmonary problem but rather related to treatment by RT or oncology so should call the prescriber = mohammed

## 2017-04-01 NOTE — Telephone Encounter (Signed)
Spoke with pt,aware of recs.  Nothing further needed.  

## 2017-04-04 ENCOUNTER — Other Ambulatory Visit (HOSPITAL_BASED_OUTPATIENT_CLINIC_OR_DEPARTMENT_OTHER): Payer: Medicaid Other

## 2017-04-04 ENCOUNTER — Ambulatory Visit
Admission: RE | Admit: 2017-04-04 | Discharge: 2017-04-04 | Disposition: A | Discharge status: Home / Self Care | Payer: Medicaid Other | Source: Ambulatory Visit | Attending: Radiation Oncology | Admitting: Radiation Oncology

## 2017-04-04 ENCOUNTER — Ambulatory Visit (HOSPITAL_BASED_OUTPATIENT_CLINIC_OR_DEPARTMENT_OTHER): Payer: Medicaid Other

## 2017-04-04 VITALS — BP 126/71

## 2017-04-04 DIAGNOSIS — C3491 Malignant neoplasm of unspecified part of right bronchus or lung: Secondary | ICD-10-CM

## 2017-04-04 DIAGNOSIS — Z5111 Encounter for antineoplastic chemotherapy: Secondary | ICD-10-CM

## 2017-04-04 DIAGNOSIS — J439 Emphysema, unspecified: Secondary | ICD-10-CM

## 2017-04-04 DIAGNOSIS — F17200 Nicotine dependence, unspecified, uncomplicated: Secondary | ICD-10-CM

## 2017-04-04 DIAGNOSIS — Z7189 Other specified counseling: Secondary | ICD-10-CM

## 2017-04-04 DIAGNOSIS — Z510 Encounter for antineoplastic radiation therapy: Secondary | ICD-10-CM | POA: Diagnosis not present

## 2017-04-04 DIAGNOSIS — C3411 Malignant neoplasm of upper lobe, right bronchus or lung: Secondary | ICD-10-CM

## 2017-04-04 LAB — COMPREHENSIVE METABOLIC PANEL
ALBUMIN: 3.1 g/dL — AB (ref 3.5–5.0)
ALK PHOS: 90 U/L (ref 40–150)
ALT: 6 U/L (ref 0–55)
AST: 11 U/L (ref 5–34)
Anion Gap: 9 mEq/L (ref 3–11)
BILIRUBIN TOTAL: 0.6 mg/dL (ref 0.20–1.20)
BUN: 8.1 mg/dL (ref 7.0–26.0)
CO2: 30 mEq/L — ABNORMAL HIGH (ref 22–29)
Calcium: 9.3 mg/dL (ref 8.4–10.4)
Chloride: 96 mEq/L — ABNORMAL LOW (ref 98–109)
Creatinine: 0.7 mg/dL (ref 0.6–1.1)
EGFR: >90 mL/min/{1.73_m2} (ref >90)
Glucose: 105 mg/dl (ref 70–140)
Potassium: 3.3 mEq/L — ABNORMAL LOW (ref 3.5–5.1)
SODIUM: 135 meq/L — AB (ref 136–145)
TOTAL PROTEIN: 7.2 g/dL (ref 6.4–8.3)

## 2017-04-04 LAB — CBC WITH DIFFERENTIAL/PLATELET
BASO%: 0.8 % (ref 0.0–2.0)
Basophils Absolute: 0 10*3/uL (ref 0.0–0.1)
EOS ABS: 0 10*3/uL (ref 0.0–0.5)
EOS%: 0.6 % (ref 0.0–7.0)
HEMATOCRIT: 39.1 % (ref 34.8–46.6)
HEMOGLOBIN: 12 g/dL (ref 11.6–15.9)
LYMPH#: 0.6 10*3/uL — AB (ref 0.9–3.3)
LYMPH%: 11.5 % — ABNORMAL LOW (ref 14.0–49.7)
MCH: 26 pg (ref 25.1–34.0)
MCHC: 30.7 g/dL — ABNORMAL LOW (ref 31.5–36.0)
MCV: 84.8 fL (ref 79.5–101.0)
MONO#: 0.5 10*3/uL (ref 0.1–0.9)
MONO%: 10.1 % (ref 0.0–14.0)
NEUT%: 77 % — ABNORMAL HIGH (ref 38.4–76.8)
NEUTROS ABS: 3.8 10*3/uL (ref 1.5–6.5)
Platelets: 197 10*3/uL (ref 145–400)
RBC: 4.61 10*6/uL (ref 3.70–5.45)
RDW: 17.7 % — AB (ref 11.2–14.5)
WBC: 4.9 10*3/uL (ref 3.9–10.3)

## 2017-04-04 MED ORDER — FAMOTIDINE IN NACL 20-0.9 MG/50ML-% IV SOLN
20.0000 mg | Freq: Once | INTRAVENOUS | Status: AC
  Administered 2017-04-04: 20 mg via INTRAVENOUS

## 2017-04-04 MED ORDER — SODIUM CHLORIDE 0.9 % IV SOLN
20.0000 mg | Freq: Once | INTRAVENOUS | Status: AC
  Administered 2017-04-04: 20 mg via INTRAVENOUS
  Filled 2017-04-04: qty 2

## 2017-04-04 MED ORDER — PALONOSETRON HCL INJECTION 0.25 MG/5ML
0.2500 mg | Freq: Once | INTRAVENOUS | Status: AC
  Administered 2017-04-04: 0.25 mg via INTRAVENOUS

## 2017-04-04 MED ORDER — DIPHENHYDRAMINE HCL 50 MG/ML IJ SOLN
INTRAMUSCULAR | Status: AC
  Filled 2017-04-04: qty 1

## 2017-04-04 MED ORDER — DIPHENHYDRAMINE HCL 50 MG/ML IJ SOLN
50.0000 mg | Freq: Once | INTRAMUSCULAR | Status: AC
  Administered 2017-04-04: 50 mg via INTRAVENOUS

## 2017-04-04 MED ORDER — SODIUM CHLORIDE 0.9 % IV SOLN
237.0000 mg | Freq: Once | INTRAVENOUS | Status: AC
  Administered 2017-04-04: 240 mg via INTRAVENOUS
  Filled 2017-04-04: qty 24

## 2017-04-04 MED ORDER — FAMOTIDINE IN NACL 20-0.9 MG/50ML-% IV SOLN
INTRAVENOUS | Status: AC
  Filled 2017-04-04: qty 50

## 2017-04-04 MED ORDER — PALONOSETRON HCL INJECTION 0.25 MG/5ML
INTRAVENOUS | Status: AC
  Filled 2017-04-04: qty 5

## 2017-04-04 MED ORDER — SODIUM CHLORIDE 0.9 % IV SOLN
Freq: Once | INTRAVENOUS | Status: AC
  Administered 2017-04-04: 13:00:00 via INTRAVENOUS

## 2017-04-04 MED ORDER — PACLITAXEL CHEMO INJECTION 300 MG/50ML
45.0000 mg/m2 | Freq: Once | INTRAVENOUS | Status: AC
  Administered 2017-04-04: 84 mg via INTRAVENOUS
  Filled 2017-04-04: qty 14

## 2017-04-04 NOTE — Patient Instructions (Signed)
[facility] Discharge Instructions for Patients Receiving Chemotherapy  Today you received the following chemotherapy agents Taxol and Carboplatin.  To help prevent nausea and vomiting after your treatment, we encourage you to take your nausea medication as prescribed.   If you develop nausea and vomiting that is not controlled by your nausea medication, call the clinic.   BELOW ARE SYMPTOMS THAT SHOULD BE REPORTED IMMEDIATELY:  *FEVER GREATER THAN 100.5 F  *CHILLS WITH OR WITHOUT FEVER  NAUSEA AND VOMITING THAT IS NOT CONTROLLED WITH YOUR NAUSEA MEDICATION  *UNUSUAL SHORTNESS OF BREATH  *UNUSUAL BRUISING OR BLEEDING  TENDERNESS IN MOUTH AND THROAT WITH OR WITHOUT PRESENCE OF ULCERS  *URINARY PROBLEMS  *BOWEL PROBLEMS  UNUSUAL RASH Items with * indicate a potential emergency and should be followed up as soon as possible.  Feel free to call the clinic you have any questions or concerns. The clinic phone number is [phone].  Please show the CHEMO ALERT CARD at check-in to the Emergency Department and triage nurse.   

## 2017-04-04 NOTE — Progress Notes (Signed)
Pt c/o diarrhea 2-4 times daily, advised " imodium is not working" pt reports taking it by the directions on the box. Reviewed with MD, instructed pt to continue imodium up to 8x per day, eat more potassium in her diet.  Pt verbalized understanding no further concerns. Verbal ok to treat from MD.

## 2017-04-05 ENCOUNTER — Ambulatory Visit
Admission: RE | Admit: 2017-04-05 | Discharge: 2017-04-05 | Disposition: A | Discharge status: Home / Self Care | Payer: Medicaid Other | Source: Ambulatory Visit | Attending: Radiation Oncology | Admitting: Radiation Oncology

## 2017-04-05 ENCOUNTER — Telehealth: Payer: Self-pay | Admitting: Oncology

## 2017-04-05 ENCOUNTER — Other Ambulatory Visit: Payer: Self-pay | Admitting: Radiation Oncology

## 2017-04-05 DIAGNOSIS — C3491 Malignant neoplasm of unspecified part of right bronchus or lung: Secondary | ICD-10-CM

## 2017-04-05 DIAGNOSIS — Z510 Encounter for antineoplastic radiation therapy: Secondary | ICD-10-CM | POA: Diagnosis not present

## 2017-04-05 MED ORDER — SUCRALFATE 1 GM/10ML PO SUSP: 1.0000 g | Freq: Three times a day (TID) | ORAL | 0 refills | Status: DC

## 2017-04-05 MED ORDER — SUCRALFATE 1 G PO TABS: 1.0000 g | ORAL_TABLET | Freq: Three times a day (TID) | ORAL | 1 refills | Status: DC

## 2017-04-05 NOTE — Telephone Encounter (Signed)
Received message that insurance will not cover patient's liquid carafate.  She would like the pill form prescription sent to her pharmacy.

## 2017-04-05 NOTE — Addendum Note (Signed)
Addended by: Elmo Putt R on: 04/05/2017 05:20 PM   Modules accepted: Orders

## 2017-04-05 NOTE — Telephone Encounter (Signed)
Called patient back and advised her that the tablet form of Carafate has been sent in to her pharmacy.

## 2017-04-06 ENCOUNTER — Ambulatory Visit
Admission: RE | Admit: 2017-04-06 | Discharge: 2017-04-06 | Disposition: A | Discharge status: Home / Self Care | Payer: Medicaid Other | Source: Ambulatory Visit | Attending: Radiation Oncology | Admitting: Radiation Oncology

## 2017-04-06 DIAGNOSIS — Z510 Encounter for antineoplastic radiation therapy: Secondary | ICD-10-CM | POA: Diagnosis not present

## 2017-04-07 ENCOUNTER — Ambulatory Visit
Admission: RE | Admit: 2017-04-07 | Discharge: 2017-04-07 | Disposition: A | Discharge status: Home / Self Care | Payer: Medicaid Other | Source: Ambulatory Visit | Attending: Radiation Oncology | Admitting: Radiation Oncology

## 2017-04-07 DIAGNOSIS — Z510 Encounter for antineoplastic radiation therapy: Secondary | ICD-10-CM | POA: Diagnosis not present

## 2017-04-08 ENCOUNTER — Ambulatory Visit
Admission: RE | Admit: 2017-04-08 | Discharge: 2017-04-08 | Disposition: A | Discharge status: Home / Self Care | Payer: Medicaid Other | Source: Ambulatory Visit | Attending: Radiation Oncology | Admitting: Radiation Oncology

## 2017-04-08 DIAGNOSIS — Z510 Encounter for antineoplastic radiation therapy: Secondary | ICD-10-CM | POA: Diagnosis not present

## 2017-04-11 ENCOUNTER — Ambulatory Visit: Payer: Medicaid Other | Admitting: Oncology

## 2017-04-11 ENCOUNTER — Ambulatory Visit: Payer: Medicaid Other

## 2017-04-11 ENCOUNTER — Other Ambulatory Visit: Payer: Medicaid Other

## 2017-04-11 ENCOUNTER — Telehealth: Payer: Self-pay | Admitting: Medical Oncology

## 2017-04-11 ENCOUNTER — Telehealth: Payer: Self-pay | Admitting: *Deleted

## 2017-04-11 NOTE — Telephone Encounter (Signed)
Pt cancelled appts today saying " she was not feeling well". I returned call and left message to return my call and tell us what are her specific symptoms. I also left message with 2 friends to please contact pt to have her call.

## 2017-04-11 NOTE — Telephone Encounter (Signed)
I did talk to pt . She is  not feeling good, very tired "just cannot get up and get dressed this am.I think it is my fibromyalgia." denies fever , dizziness, sob.  Cancelled chemo and xrt today.  Do you want me  to r/s this week ? Note to [place].

## 2017-04-11 NOTE — Telephone Encounter (Signed)
Called patient due to cancellation of appointment today.  She states she feels like she caught a bug this past weekend and is starting to feel a little bit better and expects to be present for her radiation appointment tomorrow.

## 2017-04-11 NOTE — Telephone Encounter (Signed)
Message left for patient appointment information for 04-18-2017 with Medical Oncology.  However a F/U was scheduled today may need to be added next week.  Radiation was not scheduled today but tomorrow at 0900.  "I had chemotherapy and radiation scheduled today but will not be able to make it today. See if I can make up today's chemotherapy next Monday."

## 2017-04-12 ENCOUNTER — Ambulatory Visit
Admission: RE | Admit: 2017-04-12 | Discharge: 2017-04-12 | Disposition: A | Discharge status: Home / Self Care | Payer: Medicaid Other | Source: Ambulatory Visit | Attending: Radiation Oncology | Admitting: Radiation Oncology

## 2017-04-12 ENCOUNTER — Telehealth: Payer: Self-pay | Admitting: Oncology

## 2017-04-12 DIAGNOSIS — Z510 Encounter for antineoplastic radiation therapy: Secondary | ICD-10-CM | POA: Diagnosis not present

## 2017-04-12 NOTE — Telephone Encounter (Signed)
Called patient to see if she would want to have IV fluids tomorrow.  She said that she is drinking more fluids today and does not feel like she needs IV fluids tomorrow.  Advised her to call back if she changes her mind.

## 2017-04-13 ENCOUNTER — Ambulatory Visit
Admission: RE | Admit: 2017-04-13 | Discharge: 2017-04-13 | Disposition: A | Discharge status: Home / Self Care | Payer: Medicaid Other | Source: Ambulatory Visit | Attending: Radiation Oncology | Admitting: Radiation Oncology

## 2017-04-13 DIAGNOSIS — Z510 Encounter for antineoplastic radiation therapy: Secondary | ICD-10-CM | POA: Diagnosis not present

## 2017-04-14 ENCOUNTER — Ambulatory Visit
Admission: RE | Admit: 2017-04-14 | Discharge: 2017-04-14 | Disposition: A | Discharge status: Home / Self Care | Payer: Medicaid Other | Source: Ambulatory Visit | Attending: Radiation Oncology | Admitting: Radiation Oncology

## 2017-04-14 DIAGNOSIS — Z510 Encounter for antineoplastic radiation therapy: Secondary | ICD-10-CM | POA: Diagnosis not present

## 2017-04-15 ENCOUNTER — Telehealth: Payer: Self-pay

## 2017-04-15 ENCOUNTER — Ambulatory Visit
Admission: RE | Admit: 2017-04-15 | Discharge: 2017-04-15 | Disposition: A | Discharge status: Home / Self Care | Payer: Medicaid Other | Source: Ambulatory Visit | Attending: Radiation Oncology | Admitting: Radiation Oncology

## 2017-04-15 DIAGNOSIS — Z510 Encounter for antineoplastic radiation therapy: Secondary | ICD-10-CM | POA: Diagnosis not present

## 2017-04-15 NOTE — Telephone Encounter (Signed)
8/24 inbasket noted and appt added per kc to see patient in tx on 8/27   Sydney Blair

## 2017-04-18 ENCOUNTER — Other Ambulatory Visit (HOSPITAL_BASED_OUTPATIENT_CLINIC_OR_DEPARTMENT_OTHER): Payer: Medicaid Other

## 2017-04-18 ENCOUNTER — Ambulatory Visit (HOSPITAL_BASED_OUTPATIENT_CLINIC_OR_DEPARTMENT_OTHER): Payer: Medicaid Other | Admitting: Oncology

## 2017-04-18 ENCOUNTER — Ambulatory Visit
Admission: RE | Admit: 2017-04-18 | Discharge: 2017-04-18 | Disposition: A | Discharge status: Home / Self Care | Payer: Medicaid Other | Source: Ambulatory Visit | Attending: Radiation Oncology | Admitting: Radiation Oncology

## 2017-04-18 ENCOUNTER — Ambulatory Visit (HOSPITAL_BASED_OUTPATIENT_CLINIC_OR_DEPARTMENT_OTHER): Payer: Medicaid Other

## 2017-04-18 ENCOUNTER — Telehealth: Payer: Self-pay | Admitting: Oncology

## 2017-04-18 VITALS — BP 107/57 | HR 110 | Temp 98.8°F | Resp 20 | Wt 160.0 lb

## 2017-04-18 DIAGNOSIS — C3411 Malignant neoplasm of upper lobe, right bronchus or lung: Secondary | ICD-10-CM

## 2017-04-18 DIAGNOSIS — R110 Nausea: Secondary | ICD-10-CM | POA: Diagnosis not present

## 2017-04-18 DIAGNOSIS — Z5111 Encounter for antineoplastic chemotherapy: Secondary | ICD-10-CM

## 2017-04-18 DIAGNOSIS — Z510 Encounter for antineoplastic radiation therapy: Secondary | ICD-10-CM | POA: Diagnosis not present

## 2017-04-18 DIAGNOSIS — F17200 Nicotine dependence, unspecified, uncomplicated: Secondary | ICD-10-CM

## 2017-04-18 DIAGNOSIS — Z7189 Other specified counseling: Secondary | ICD-10-CM

## 2017-04-18 DIAGNOSIS — R634 Abnormal weight loss: Secondary | ICD-10-CM

## 2017-04-18 DIAGNOSIS — C3491 Malignant neoplasm of unspecified part of right bronchus or lung: Secondary | ICD-10-CM

## 2017-04-18 DIAGNOSIS — J439 Emphysema, unspecified: Secondary | ICD-10-CM

## 2017-04-18 LAB — COMPREHENSIVE METABOLIC PANEL
ALT: 7 U/L (ref 0–55)
AST: 12 U/L (ref 5–34)
Albumin: 3.2 g/dL — ABNORMAL LOW (ref 3.5–5.0)
Alkaline Phosphatase: 104 U/L (ref 40–150)
Anion Gap: 12 mEq/L — ABNORMAL HIGH (ref 3–11)
BILIRUBIN TOTAL: 0.59 mg/dL (ref 0.20–1.20)
BUN: 7.2 mg/dL (ref 7.0–26.0)
CHLORIDE: 97 meq/L — AB (ref 98–109)
CO2: 29 meq/L (ref 22–29)
CREATININE: 0.7 mg/dL (ref 0.6–1.1)
Calcium: 9.6 mg/dL (ref 8.4–10.4)
EGFR: >90 mL/min/{1.73_m2} (ref >90)
GLUCOSE: 95 mg/dL (ref 70–140)
Potassium: 4.3 mEq/L (ref 3.5–5.1)
SODIUM: 138 meq/L (ref 136–145)
TOTAL PROTEIN: 7.5 g/dL (ref 6.4–8.3)

## 2017-04-18 LAB — CBC WITH DIFFERENTIAL/PLATELET
BASO%: 1 % (ref 0.0–2.0)
BASOS ABS: 0 10*3/uL (ref 0.0–0.1)
EOS ABS: 0 10*3/uL (ref 0.0–0.5)
EOS%: 0.8 % (ref 0.0–7.0)
HCT: 37.2 % (ref 34.8–46.6)
HGB: 12.2 g/dL (ref 11.6–15.9)
LYMPH%: 10.9 % — AB (ref 14.0–49.7)
MCH: 27.5 pg (ref 25.1–34.0)
MCHC: 32.7 g/dL (ref 31.5–36.0)
MCV: 84 fL (ref 79.5–101.0)
MONO#: 0.4 10*3/uL (ref 0.1–0.9)
MONO%: 7.6 % (ref 0.0–14.0)
NEUT%: 79.7 % — ABNORMAL HIGH (ref 38.4–76.8)
NEUTROS ABS: 3.9 10*3/uL (ref 1.5–6.5)
Platelets: 104 10*3/uL — ABNORMAL LOW (ref 145–400)
RBC: 4.42 10*6/uL (ref 3.70–5.45)
RDW: 21.8 % — ABNORMAL HIGH (ref 11.2–14.5)
WBC: 4.9 10*3/uL (ref 3.9–10.3)
lymph#: 0.5 10*3/uL — ABNORMAL LOW (ref 0.9–3.3)

## 2017-04-18 MED ORDER — SODIUM CHLORIDE 0.9 % IV SOLN
20.0000 mg | Freq: Once | INTRAVENOUS | Status: AC
  Administered 2017-04-18: 20 mg via INTRAVENOUS
  Filled 2017-04-18: qty 2

## 2017-04-18 MED ORDER — PALONOSETRON HCL INJECTION 0.25 MG/5ML
0.2500 mg | Freq: Once | INTRAVENOUS | Status: AC
  Administered 2017-04-18: 0.25 mg via INTRAVENOUS

## 2017-04-18 MED ORDER — DIPHENHYDRAMINE HCL 50 MG/ML IJ SOLN
50.0000 mg | Freq: Once | INTRAMUSCULAR | Status: AC
  Administered 2017-04-18: 50 mg via INTRAVENOUS

## 2017-04-18 MED ORDER — METHYLPREDNISOLONE 4 MG PO TABS: ORAL_TABLET | ORAL | 0 refills | Status: DC

## 2017-04-18 MED ORDER — FAMOTIDINE IN NACL 20-0.9 MG/50ML-% IV SOLN
20.0000 mg | Freq: Once | INTRAVENOUS | Status: AC
  Administered 2017-04-18: 20 mg via INTRAVENOUS

## 2017-04-18 MED ORDER — SODIUM CHLORIDE 0.9 % IV SOLN
240.0000 mg | Freq: Once | INTRAVENOUS | Status: AC
  Administered 2017-04-18: 240 mg via INTRAVENOUS
  Filled 2017-04-18: qty 24

## 2017-04-18 MED ORDER — DEXTROSE 5 % IV SOLN
45.0000 mg/m2 | Freq: Once | INTRAVENOUS | Status: AC
  Administered 2017-04-18: 84 mg via INTRAVENOUS
  Filled 2017-04-18: qty 14

## 2017-04-18 MED ORDER — SODIUM CHLORIDE 0.9 % IV SOLN
Freq: Once | INTRAVENOUS | Status: AC
  Administered 2017-04-18: 11:00:00 via INTRAVENOUS

## 2017-04-18 NOTE — Progress Notes (Signed)
[facility] Follow up:    Sydney Ebbs, MD [address] [place] [address]   DIAGNOSIS: Cancer Staging No matching staging information was found for the patient. Stage IIIa (T2b, N2, M0) non-small cell lung cancer, squamous cell carcinoma presented with large right upper lobe lung mass in addition to right hilar and mediastinal lymphadenopathy diagnosed in July 2018. PDL 1 expression is negative   SUMMARY OF ONCOLOGIC HISTORY: Oncology History   Patient presented with incidental finding on pre-op CXR for a breast biopsy.     Lung mass   12/08/2016 Imaging    CXR IMPRESSION: 5 cm masslike opacity within the right upper lobe -chest CT with contrast is recommended for further evaluation.      02/01/2017 Initial Diagnosis    Lung mass     02/11/2017 Imaging    PET IMPRESSION: 1. Hypermetabolic RIGHT upper lobe mass consists with bronchogenic carcinoma. 2. Hypermetabolic RIGHT hilar and ipsilateral RIGHT paratracheal metastatic adenopathy. 3. No contralateral nodal metastasis or supraclavicular nodal metastasis.      02/28/2017 Surgery    Operation: Flexible video fiberoptic bronchoscopy with endobronchial ultrasound and biopsies.      02/28/2017 Pathology Results    Cytology Diagnosis BRONCHIAL BRUSHING SPECIMEN D, RIGHT UPPER LOBE (SPECIMEN 4 OF 4 COLLECTED 02/28/2017) MALIGNANT CELLS CONSISTENT WITH SQUAMOUS CELL CARCINOMA.        CURRENT THERAPY: Current chemoradiation with weekly carboplatin for an AUC of 2 and paclitaxel 45 mg meter squared. First dose was given on 03/21/2017. Status post three cycles of her chemotherapy.  INTERVAL HISTORY: Sydney [facility] 61 y.o. female returns for routine follow-up. She is seen in the infusion room today. The patient missed her chemotherapy last week stating that she did not feel well enough to come. When asked specifically what was going on, she reported that she was having diarrhea. She feels more  tired on treatment. Denies fevers and chills. Denies chest pain, shortness of breath at rest, hemoptysis. She has her baseline cough and dyspnea on exertion. She wears oxygen at home. She has intermittent nausea, but no vomiting. Uses Compazine as needed. Denies constipation and diarrhea today. She has her baseline neuropathy which is unchanged since starting chemotherapy. Reports some difficulty swallowing, but is currently on Carafate per radiation oncology. Appetite has been up and down, but she has lost weight. States she is not eating as many sweets now. The patient is here for evaluation prior to cycle 4 of her chemotherapy.   Patient Active Problem List   Diagnosis Date Noted  . Stage III squamous cell carcinoma of right lung ([facility]) 03/10/2017  . Goals of care, counseling/discussion 03/10/2017  . Encounter for antineoplastic chemotherapy 03/10/2017  . Lung mass 02/01/2017  . Breast mass, right   . Hx of adenomatous colonic polyps 12/28/2016  . Fracture of fifth metatarsal bone of right foot with delayed healing 02/03/2016  . IBS (irritable bowel syndrome) 07/30/2015  . Diarrhea 10/29/2014  . Hoarseness 10/29/2014  . Chronic hoarseness 01/16/2014  . Other dysphagia 01/16/2014  . Dysphagia, unspecified(787.20) 06/04/2013  . Unspecified constipation 06/04/2013  . Unsteady gait 01/25/2013  . COPD (chronic obstructive pulmonary disease) with emphysema ([facility]) 12/26/2012  . DOE (dyspnea on exertion) 12/01/2012  . 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 Dysphagia 11/09/2012  . COPD exacerbation ([facility] [facility]) 08/07/2011  . Hypokalemia 08/07/2011  . Myositis 08/07/2011  . Dehydration 08/07/2011  . ARF (acute renal failure) ([facility]) 08/07/2011  . Tubular adenoma 07/20/2011  .  INSOMNIA 02/26/2008  . CERUMEN IMPACTION, RIGHT 01/23/2008  . 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 NECK PAIN 11/22/2007  . HAMMER TOE 11/22/2007  . ANXIETY 01/30/2007  .  TOBACCO ABUSE 01/30/2007  . DEPRESSION 01/30/2007  . GERD 01/30/2007  . PROLAPSE, VAGINAL WALL, CYSTOCELE, MIDLINE 01/10/2007  . INCONTINENCE 01/10/2007  . SEBACEOUS CYST 08/17/2006  . Breast cancer, right breast ([place]) 12/01/2003    is allergic to penicillins.  MEDICAL HISTORY: Past Medical History:  Diagnosis Date  . Anxiety   . ARF (acute renal failure) ([place]) 08/07/2011   ?   Marland Kitchen Arthritis   . Cancer ([facility]) 2005   breast-right  . Chronic back pain   . COPD (chronic obstructive pulmonary disease) ([facility])   . Depression   . Depression with anxiety   . Dyspnea    on exertion  . Fibromyalgia   . GERD (gastroesophageal reflux disease)   . Hypercholesteremia   . Hypothyroidism   . Incontinence   . Neuropathy   . Peripheral neuropathy   . Sciatic pain   . Sebaceous cyst    lt labial location  . Sleep apnea    uses CPAP  . Stage III squamous cell carcinoma of right lung ([facility]) 03/10/2017  . Tobacco abuse     SURGICAL HISTORY: Past Surgical History:  Procedure Laterality Date  . bladder tack    . BREAST BIOPSY Right 12/29/2016   Procedure: RIGHT BREAST BIOPSY;  Surgeon: Aviva Signs, MD;  Location: [facility];  Service: General;  Laterality: Right;  . BREAST LUMPECTOMY     right with node dissection  . COLONOSCOPY  11/11/2010   IWL:NLGXQJJHER rectal polyp and splenic flexure otherwise normal; pathology showed  tubular adenomas. Next TCS 10/2015.  Marland Kitchen ESOPHAGOGASTRODUODENOSCOPY  11/11/2010   DEY:CXKGY hiatal hernia, 29 French dilator  . ESOPHAGOGASTRODUODENOSCOPY (EGD) WITH ESOPHAGEAL DILATION N/A 06/21/2013   JEH:UDJSHF esophagus  - status post Maloney dilation s/p bx (benign esophageal bx)  . VIDEO BRONCHOSCOPY WITH ENDOBRONCHIAL ULTRASOUND N/A 02/28/2017   Procedure: VIDEO BRONCHOSCOPY WITH ENDOBRONCHIAL ULTRASOUND;  Surgeon: Juanito Doom, MD;  Location: [facility];  Service: Thoracic;  Laterality: N/A;    SOCIAL HISTORY: Social History   Social History  . Marital status:  Married    Spouse name: N/A  . Number of children: 1  . Years of education: N/A   Occupational History  . disabled Unemployed   Social History Main Topics  . Smoking status: Current Every Day Smoker    Packs/day: 1.00    Years: 35.00    Types: Cigarettes  . Smokeless tobacco: Never Used     Comment: has cut back to one pack daily  . Alcohol use No  . Drug use: No  . Sexual activity: Yes    Birth control/ protection: Post-menopausal     Comment: chemo stopped her periods   Other Topics Concern  . Not on file   Social History Narrative   Lives w. Husband   Lost 1 son-?etiology    FAMILY HISTORY: Family History  Problem Relation Age of Onset  . Cancer Other   . Heart attack Other   . Lung disease Other   . Emphysema Mother     Review of Systems  Constitutional: Positive for fatigue and unexpected weight change. Negative for chills and fever.  HENT:   Positive for trouble swallowing. Negative for mouth sores and sore throat.   Eyes: Negative.   Respiratory: Positive for cough. Negative for hemoptysis.  Reports dyspnea on exertion.  Cardiovascular: Negative.   Gastrointestinal: Positive for nausea. Negative for abdominal distention, abdominal pain, constipation, diarrhea and vomiting.  Endocrine: Negative.   Genitourinary: Negative.    Musculoskeletal: Negative.   Skin: Negative.   Neurological: Negative.   Hematological: Negative.   Psychiatric/Behavioral: Negative.       PHYSICAL EXAMINATION  ECOG PERFORMANCE STATUS: 1 - Symptomatic but completely ambulatory  There were no vitals filed for this visit.  Physical Exam  Constitutional: She is oriented to person, place, and time and well-developed, well-nourished, and in no distress. No distress.  HENT:  Head: Normocephalic.  Mouth/Throat: Oropharynx is clear and moist. No oropharyngeal exudate.  Eyes: Conjunctivae are normal. Right eye exhibits no discharge. Left eye exhibits no discharge. No scleral  icterus.  Neck: Normal range of motion. Neck supple.  Cardiovascular: Normal rate, regular rhythm, normal heart sounds and intact distal pulses.   Pulmonary/Chest: Effort normal.  Diminished breath sounds bilaterally.  Abdominal: Soft. Bowel sounds are normal. She exhibits no distension and no mass. There is no tenderness.  Musculoskeletal: Normal range of motion. She exhibits no edema.  Lymphadenopathy:    She has no cervical adenopathy.  Neurological: She is alert and oriented to person, place, and time. She exhibits normal muscle tone. Gait normal.  Skin: Skin is warm and dry. No rash noted. She is not diaphoretic. No erythema. No pallor.  Psychiatric: Mood, memory, affect and judgment normal.  Vitals reviewed.   LABORATORY DATA:  CBC    Component Value Date/Time   WBC 4.9 04/18/2017 0925   WBC 11.1 (H) 02/25/2017 1234   RBC 4.42 04/18/2017 0925   RBC 5.17 (H) 02/25/2017 1234   HGB 12.2 04/18/2017 0925   HCT 37.2 04/18/2017 0925   PLT 104 (L) 04/18/2017 0925   MCV 84.0 04/18/2017 0925   MCH 27.5 04/18/2017 0925   MCH 26.6 02/24/2017 0607   MCHC 32.7 04/18/2017 0925   MCHC 32.3 02/25/2017 1234   RDW 21.8 (H) 04/18/2017 0925   LYMPHSABS 0.5 (L) 04/18/2017 0925   MONOABS 0.4 04/18/2017 0925   EOSABS 0.0 04/18/2017 0925   BASOSABS 0.0 04/18/2017 0925    CMP     Component Value Date/Time   NA 138 04/18/2017 0925   K 4.3 04/18/2017 0925   CL 99 02/25/2017 1234   CO2 29 04/18/2017 0925   GLUCOSE 95 04/18/2017 0925   BUN 7.2 04/18/2017 0925   CREATININE 0.7 04/18/2017 0925   CALCIUM 9.6 04/18/2017 0925   PROT 7.5 04/18/2017 0925   ALBUMIN 3.2 (L) 04/18/2017 0925   AST 12 04/18/2017 0925   ALT 7 04/18/2017 0925   ALKPHOS 104 04/18/2017 0925   BILITOT 0.59 04/18/2017 0925   GFRNONAA >60 02/24/2017 0607   GFRAA >60 02/24/2017 4098    RADIOGRAPHIC STUDIES:  No results found.  ASSESSMENT and THERAPY PLAN:   Stage III squamous cell carcinoma of right lung  [facility]) This is a 61 year old female with stage IIIa (T2b, N2, M0) non-small cell lung cancer, squamous cell carcinoma presented with large right upper lobe lung mass in addition to right hilar and mediastinal lymphadenopathy diagnosed in July 2018. PDL 1 expression is negative. Patient is currently undergoing a course of concurrent chemoradiation with weekly carboplatin for an AUC of 2 and paclitaxel 45 mg meter squared. Tolerating this well with the exception of nausea and decreased appetite.  Recommend that she proceed with cycle 4 of her chemotherapy as scheduled today. The patient was encouraged to  take her Compazine on a regular basis. She may use ginger ale in addition to her antiemetic.  A Medrol Dosepak was prescribed to help stimulate her appetite and help her gain back some of the weight that she is lost.I have also placed a dietitian referral.  The patient return next week for lab and chemotherapy and in 2 weeks she will have a visit with her chemotherapy.   Orders Placed This Encounter  Procedures  . Amb Referral to Nutrition and Diabetic E    Referral Priority:   Routine    Referral Type:   Consultation    Referral Reason:   Specialty Services Required    Number of Visits Requested:   1    All questions were answered. The patient knows to call the clinic with any problems, questions or concerns. We can certainly see the patient much sooner if necessary.  Mikey Bussing, NP 04/18/2017

## 2017-04-18 NOTE — Patient Instructions (Signed)
[facility] Discharge Instructions for Patients Receiving Chemotherapy  Today you received the following chemotherapy agents taxol/carboplatin  To help prevent nausea and vomiting after your treatment, we encourage you to take your nausea medication as directed   If you develop nausea and vomiting that is not controlled by your nausea medication, call the clinic.   BELOW ARE SYMPTOMS THAT SHOULD BE REPORTED IMMEDIATELY:  *FEVER GREATER THAN 100.5 F  *CHILLS WITH OR WITHOUT FEVER  NAUSEA AND VOMITING THAT IS NOT CONTROLLED WITH YOUR NAUSEA MEDICATION  *UNUSUAL SHORTNESS OF BREATH  *UNUSUAL BRUISING OR BLEEDING  TENDERNESS IN MOUTH AND THROAT WITH OR WITHOUT PRESENCE OF ULCERS  *URINARY PROBLEMS  *BOWEL PROBLEMS  UNUSUAL RASH Items with * indicate a potential emergency and should be followed up as soon as possible.  Feel free to call the clinic you have any questions or concerns. The clinic phone number is [phone].  

## 2017-04-18 NOTE — Progress Notes (Signed)
Ok to treat per Altamese Dilling, NP.  Pt seen by NP in infusion room today.

## 2017-04-18 NOTE — Telephone Encounter (Signed)
R/s appt per 8/27 los - patient is aware of appt time and dates .

## 2017-04-18 NOTE — Assessment & Plan Note (Signed)
This is a 61 year old female with stage IIIa (T2b, N2, M0) non-small cell lung cancer, squamous cell carcinoma presented with large right upper lobe lung mass in addition to right hilar and mediastinal lymphadenopathy diagnosed in July 2018. PDL 1 expression is negative. Patient is currently undergoing a course of concurrent chemoradiation with weekly carboplatin for an AUC of 2 and paclitaxel 45 mg meter squared. Tolerating this well with the exception of nausea and decreased appetite.  Recommend that she proceed with cycle 4 of her chemotherapy as scheduled today. The patient was encouraged to take her Compazine on a regular basis. She may use ginger ale in addition to her antiemetic.  A Medrol Dosepak was prescribed to help stimulate her appetite and help her gain back some of the weight that she is lost.I have also placed a dietitian referral.  The patient return next week for lab and chemotherapy and in 2 weeks she will have a visit with her chemotherapy.

## 2017-04-19 ENCOUNTER — Ambulatory Visit
Admission: RE | Admit: 2017-04-19 | Discharge: 2017-04-19 | Disposition: A | Discharge status: Home / Self Care | Payer: Medicaid Other | Source: Ambulatory Visit | Attending: Radiation Oncology | Admitting: Radiation Oncology

## 2017-04-19 DIAGNOSIS — Z510 Encounter for antineoplastic radiation therapy: Secondary | ICD-10-CM | POA: Diagnosis not present

## 2017-04-20 ENCOUNTER — Ambulatory Visit
Admission: RE | Admit: 2017-04-20 | Discharge: 2017-04-20 | Disposition: A | Discharge status: Home / Self Care | Payer: Medicaid Other | Source: Ambulatory Visit | Attending: Radiation Oncology | Admitting: Radiation Oncology

## 2017-04-20 DIAGNOSIS — Z510 Encounter for antineoplastic radiation therapy: Secondary | ICD-10-CM | POA: Diagnosis not present

## 2017-04-21 ENCOUNTER — Ambulatory Visit
Admission: RE | Admit: 2017-04-21 | Discharge: 2017-04-21 | Disposition: A | Discharge status: Home / Self Care | Payer: Medicaid Other | Source: Ambulatory Visit | Attending: Radiation Oncology | Admitting: Radiation Oncology

## 2017-04-21 DIAGNOSIS — Z510 Encounter for antineoplastic radiation therapy: Secondary | ICD-10-CM | POA: Diagnosis not present

## 2017-04-22 ENCOUNTER — Ambulatory Visit
Admission: RE | Admit: 2017-04-22 | Discharge: 2017-04-22 | Disposition: A | Discharge status: Home / Self Care | Payer: Medicaid Other | Source: Ambulatory Visit | Attending: Radiation Oncology | Admitting: Radiation Oncology

## 2017-04-22 DIAGNOSIS — Z510 Encounter for antineoplastic radiation therapy: Secondary | ICD-10-CM | POA: Diagnosis not present

## 2017-04-26 ENCOUNTER — Ambulatory Visit
Admission: RE | Admit: 2017-04-26 | Discharge: 2017-04-26 | Disposition: A | Discharge status: Home / Self Care | Payer: Medicaid Other | Source: Ambulatory Visit | Attending: Radiation Oncology | Admitting: Radiation Oncology

## 2017-04-26 ENCOUNTER — Ambulatory Visit (HOSPITAL_BASED_OUTPATIENT_CLINIC_OR_DEPARTMENT_OTHER): Payer: Medicaid Other

## 2017-04-26 ENCOUNTER — Other Ambulatory Visit: Payer: Medicaid Other

## 2017-04-26 ENCOUNTER — Ambulatory Visit: Payer: Medicaid Other

## 2017-04-26 ENCOUNTER — Ambulatory Visit: Payer: Medicaid Other | Admitting: Oncology

## 2017-04-26 ENCOUNTER — Other Ambulatory Visit (HOSPITAL_BASED_OUTPATIENT_CLINIC_OR_DEPARTMENT_OTHER): Payer: Medicaid Other

## 2017-04-26 VITALS — BP 130/70 | HR 94 | Temp 99.0°F | Resp 20

## 2017-04-26 DIAGNOSIS — Z5111 Encounter for antineoplastic chemotherapy: Secondary | ICD-10-CM | POA: Diagnosis not present

## 2017-04-26 DIAGNOSIS — C3491 Malignant neoplasm of unspecified part of right bronchus or lung: Secondary | ICD-10-CM

## 2017-04-26 DIAGNOSIS — F17200 Nicotine dependence, unspecified, uncomplicated: Secondary | ICD-10-CM

## 2017-04-26 DIAGNOSIS — C3411 Malignant neoplasm of upper lobe, right bronchus or lung: Secondary | ICD-10-CM

## 2017-04-26 DIAGNOSIS — Z7189 Other specified counseling: Secondary | ICD-10-CM

## 2017-04-26 DIAGNOSIS — J439 Emphysema, unspecified: Secondary | ICD-10-CM

## 2017-04-26 DIAGNOSIS — Z510 Encounter for antineoplastic radiation therapy: Secondary | ICD-10-CM | POA: Diagnosis not present

## 2017-04-26 LAB — CBC WITH DIFFERENTIAL/PLATELET
BASO%: 0.7 % (ref 0.0–2.0)
BASOS ABS: 0 10*3/uL (ref 0.0–0.1)
EOS%: 1.7 % (ref 0.0–7.0)
Eosinophils Absolute: 0.1 10*3/uL (ref 0.0–0.5)
HEMATOCRIT: 30 % — AB (ref 34.8–46.6)
HEMOGLOBIN: 10.1 g/dL — AB (ref 11.6–15.9)
LYMPH#: 0.4 10*3/uL — AB (ref 0.9–3.3)
LYMPH%: 13 % — ABNORMAL LOW (ref 14.0–49.7)
MCH: 28.4 pg (ref 25.1–34.0)
MCHC: 33.7 g/dL (ref 31.5–36.0)
MCV: 84.2 fL (ref 79.5–101.0)
MONO#: 0.3 10*3/uL (ref 0.1–0.9)
MONO%: 8.4 % (ref 0.0–14.0)
NEUT#: 2.6 10*3/uL (ref 1.5–6.5)
NEUT%: 76.2 % (ref 38.4–76.8)
Platelets: 181 10*3/uL (ref 145–400)
RBC: 3.56 10*6/uL — ABNORMAL LOW (ref 3.70–5.45)
RDW: 22.4 % — AB (ref 11.2–14.5)
WBC: 3.4 10*3/uL — ABNORMAL LOW (ref 3.9–10.3)

## 2017-04-26 LAB — COMPREHENSIVE METABOLIC PANEL
ALBUMIN: 3 g/dL — AB (ref 3.5–5.0)
ALK PHOS: 82 U/L (ref 40–150)
ALT: 7 U/L (ref 0–55)
AST: 12 U/L (ref 5–34)
Anion Gap: 8 mEq/L (ref 3–11)
BILIRUBIN TOTAL: 0.47 mg/dL (ref 0.20–1.20)
BUN: 8.2 mg/dL (ref 7.0–26.0)
CO2: 31 mEq/L — ABNORMAL HIGH (ref 22–29)
CREATININE: 0.7 mg/dL (ref 0.6–1.1)
Calcium: 9.2 mg/dL (ref 8.4–10.4)
Chloride: 96 mEq/L — ABNORMAL LOW (ref 98–109)
EGFR: >90 mL/min/{1.73_m2} (ref >90)
Glucose: 86 mg/dl (ref 70–140)
Potassium: 4.2 mEq/L (ref 3.5–5.1)
Sodium: 135 mEq/L — ABNORMAL LOW (ref 136–145)
TOTAL PROTEIN: 6.9 g/dL (ref 6.4–8.3)

## 2017-04-26 MED ORDER — FAMOTIDINE IN NACL 20-0.9 MG/50ML-% IV SOLN
INTRAVENOUS | Status: AC
  Filled 2017-04-26: qty 50

## 2017-04-26 MED ORDER — FAMOTIDINE IN NACL 20-0.9 MG/50ML-% IV SOLN
20.0000 mg | Freq: Once | INTRAVENOUS | Status: AC
  Administered 2017-04-26: 20 mg via INTRAVENOUS

## 2017-04-26 MED ORDER — PACLITAXEL CHEMO INJECTION 300 MG/50ML
45.0000 mg/m2 | Freq: Once | INTRAVENOUS | Status: AC
  Administered 2017-04-26: 84 mg via INTRAVENOUS
  Filled 2017-04-26: qty 14

## 2017-04-26 MED ORDER — PALONOSETRON HCL INJECTION 0.25 MG/5ML
0.2500 mg | Freq: Once | INTRAVENOUS | Status: AC
  Administered 2017-04-26: 0.25 mg via INTRAVENOUS

## 2017-04-26 MED ORDER — DIPHENHYDRAMINE HCL 50 MG/ML IJ SOLN
INTRAMUSCULAR | Status: AC
  Filled 2017-04-26: qty 1

## 2017-04-26 MED ORDER — SODIUM CHLORIDE 0.9 % IV SOLN
20.0000 mg | Freq: Once | INTRAVENOUS | Status: AC
  Administered 2017-04-26: 20 mg via INTRAVENOUS
  Filled 2017-04-26: qty 2

## 2017-04-26 MED ORDER — CARBOPLATIN CHEMO INJECTION 450 MG/45ML
237.0000 mg | Freq: Once | INTRAVENOUS | Status: AC
  Administered 2017-04-26: 240 mg via INTRAVENOUS
  Filled 2017-04-26: qty 24

## 2017-04-26 MED ORDER — PALONOSETRON HCL INJECTION 0.25 MG/5ML
INTRAVENOUS | Status: AC
  Filled 2017-04-26: qty 5

## 2017-04-26 MED ORDER — SODIUM CHLORIDE 0.9 % IV SOLN
Freq: Once | INTRAVENOUS | Status: AC
  Administered 2017-04-26: 11:00:00 via INTRAVENOUS

## 2017-04-26 MED ORDER — DIPHENHYDRAMINE HCL 50 MG/ML IJ SOLN
50.0000 mg | Freq: Once | INTRAMUSCULAR | Status: AC
  Administered 2017-04-26: 50 mg via INTRAVENOUS

## 2017-04-26 NOTE — Progress Notes (Signed)
Nutrition Assessment   Reason for Assessment:   Patient with decreased appetite, weight loss  ASSESSMENT:  61 year old female with stage III right lung cancer, currently on chemotherapy and radiation therapy.  Past medical history of IBS, dysphgaia, COPD, GERD, right breast cancer.   Patient seen during infusion.  Patient reports decreased appetite, has not seen much of increase in appetite after starting medrol.  Reports some nausea especially in the evening.  Reports she is taking medication that helps with nausea.  Reports some diarrhea.  Reports that typically has toast and fruit for breakfast, lunch ate hot dog today and ate sandwich last night for dinner with fruit.  Reports that she drinks equate nutrition shake per day.  Not sure how many calories are in the shake.  Reports some trouble swallowing but thinks her esophagus needs to be stretched again.  Reports dilation in the past.    Nutrition Focused Physical Exam: deferred  Medications: medrol, zofran, KCL, compazine, carafate  Labs: Na 135  Anthropometrics:   Height: 60 inches Weight: 160 lb UBW: 165 lb BMI: 31  Noted 171 lb on 8/1.    6% weight loss in the last month, significant   Estimated Energy Needs  Kcals: 1825-2190 calories/d Protein: 91-110 g/d Fluid: 2.1 L/d  NUTRITION DIAGNOSIS: Inadequate oral intake related to cancer related treatment side effects as evidenced by 6% weight loss in the last month   MALNUTRITION DIAGNOSIS: Patient meets criteria for severe malnutrition in acute illness/injury as evidenced by 6% weight loss in 1 month and eating < or equal to 50% of estimated energy needs for > or equal to 5 days.    INTERVENTION:   Discussed importance of nutrition.  Discussed strategies to help with nausea. Fact sheet given Discussed easy to swallow foods and ways to increase calories and protein. Encouraged nutrition supplement of at least 350 calories or more per supplement.  Coupons provided.    Contact information provided and encouraged patient to call with questions    MONITORING, EVALUATION, GOAL: Patient will consume adequate calories and protein to prevent further weight loss   NEXT VISIT: as needed  Isabelly Kobler B. Zenia Resides, [place], [place] Registered Dietitian [phone] (pager)

## 2017-04-26 NOTE — Patient Instructions (Signed)
[facility] Discharge Instructions for Patients Receiving Chemotherapy  Today you received the following chemotherapy agents Taxol and Carboplatin. To help prevent nausea and vomiting after your treatment, we encourage you to take your nausea medication as directed.  If you develop nausea and vomiting that is not controlled by your nausea medication, call the clinic.   BELOW ARE SYMPTOMS THAT SHOULD BE REPORTED IMMEDIATELY:  *FEVER GREATER THAN 100.5 F  *CHILLS WITH OR WITHOUT FEVER  NAUSEA AND VOMITING THAT IS NOT CONTROLLED WITH YOUR NAUSEA MEDICATION  *UNUSUAL SHORTNESS OF BREATH  *UNUSUAL BRUISING OR BLEEDING  TENDERNESS IN MOUTH AND THROAT WITH OR WITHOUT PRESENCE OF ULCERS  *URINARY PROBLEMS  *BOWEL PROBLEMS  UNUSUAL RASH Items with * indicate a potential emergency and should be followed up as soon as possible.  Feel free to call the clinic you have any questions or concerns. The clinic phone number is [phone].  Please show the CHEMO ALERT CARD at check-in to the Emergency Department and triage nurse.    

## 2017-04-27 ENCOUNTER — Ambulatory Visit
Admission: RE | Admit: 2017-04-27 | Discharge: 2017-04-27 | Disposition: A | Discharge status: Home / Self Care | Payer: Medicaid Other | Source: Ambulatory Visit | Attending: Radiation Oncology | Admitting: Radiation Oncology

## 2017-04-27 DIAGNOSIS — Z510 Encounter for antineoplastic radiation therapy: Secondary | ICD-10-CM | POA: Diagnosis not present

## 2017-04-28 ENCOUNTER — Ambulatory Visit
Admission: RE | Admit: 2017-04-28 | Discharge: 2017-04-28 | Disposition: A | Discharge status: Home / Self Care | Payer: Medicaid Other | Source: Ambulatory Visit | Attending: Radiation Oncology | Admitting: Radiation Oncology

## 2017-04-28 DIAGNOSIS — Z510 Encounter for antineoplastic radiation therapy: Secondary | ICD-10-CM | POA: Diagnosis not present

## 2017-04-29 ENCOUNTER — Ambulatory Visit
Admission: RE | Admit: 2017-04-29 | Discharge: 2017-04-29 | Disposition: A | Discharge status: Home / Self Care | Payer: Medicaid Other | Source: Ambulatory Visit | Attending: Radiation Oncology | Admitting: Radiation Oncology

## 2017-04-29 DIAGNOSIS — Z510 Encounter for antineoplastic radiation therapy: Secondary | ICD-10-CM | POA: Diagnosis not present

## 2017-05-02 ENCOUNTER — Ambulatory Visit (HOSPITAL_BASED_OUTPATIENT_CLINIC_OR_DEPARTMENT_OTHER): Payer: Medicaid Other

## 2017-05-02 ENCOUNTER — Telehealth: Payer: Self-pay | Admitting: Internal Medicine

## 2017-05-02 ENCOUNTER — Encounter: Payer: Self-pay | Admitting: Oncology

## 2017-05-02 ENCOUNTER — Other Ambulatory Visit (HOSPITAL_BASED_OUTPATIENT_CLINIC_OR_DEPARTMENT_OTHER): Payer: Medicaid Other

## 2017-05-02 ENCOUNTER — Ambulatory Visit (HOSPITAL_BASED_OUTPATIENT_CLINIC_OR_DEPARTMENT_OTHER): Payer: Medicaid Other | Admitting: Oncology

## 2017-05-02 ENCOUNTER — Ambulatory Visit
Admission: RE | Admit: 2017-05-02 | Discharge: 2017-05-02 | Disposition: A | Discharge status: Home / Self Care | Payer: Medicaid Other | Source: Ambulatory Visit | Attending: Radiation Oncology | Admitting: Radiation Oncology

## 2017-05-02 VITALS — BP 102/61 | HR 110 | Temp 98.6°F | Resp 18 | Ht 60.0 in | Wt 158.5 lb

## 2017-05-02 DIAGNOSIS — C3411 Malignant neoplasm of upper lobe, right bronchus or lung: Secondary | ICD-10-CM

## 2017-05-02 DIAGNOSIS — Z5111 Encounter for antineoplastic chemotherapy: Secondary | ICD-10-CM

## 2017-05-02 DIAGNOSIS — R1310 Dysphagia, unspecified: Secondary | ICD-10-CM | POA: Diagnosis not present

## 2017-05-02 DIAGNOSIS — R110 Nausea: Secondary | ICD-10-CM

## 2017-05-02 DIAGNOSIS — F17200 Nicotine dependence, unspecified, uncomplicated: Secondary | ICD-10-CM

## 2017-05-02 DIAGNOSIS — C3491 Malignant neoplasm of unspecified part of right bronchus or lung: Secondary | ICD-10-CM

## 2017-05-02 DIAGNOSIS — J439 Emphysema, unspecified: Secondary | ICD-10-CM

## 2017-05-02 DIAGNOSIS — Z510 Encounter for antineoplastic radiation therapy: Secondary | ICD-10-CM | POA: Diagnosis not present

## 2017-05-02 DIAGNOSIS — E46 Unspecified protein-calorie malnutrition: Secondary | ICD-10-CM

## 2017-05-02 DIAGNOSIS — R530 Neoplastic (malignant) related fatigue: Secondary | ICD-10-CM

## 2017-05-02 DIAGNOSIS — Z7189 Other specified counseling: Secondary | ICD-10-CM

## 2017-05-02 LAB — CBC WITH DIFFERENTIAL/PLATELET
BASO%: 0.4 % (ref 0.0–2.0)
BASOS ABS: 0 10*3/uL (ref 0.0–0.1)
EOS ABS: 0 10*3/uL (ref 0.0–0.5)
EOS%: 0.4 % (ref 0.0–7.0)
HCT: 31.9 % — ABNORMAL LOW (ref 34.8–46.6)
HGB: 10.1 g/dL — ABNORMAL LOW (ref 11.6–15.9)
LYMPH%: 16.7 % (ref 14.0–49.7)
MCH: 28 pg (ref 25.1–34.0)
MCHC: 31.7 g/dL (ref 31.5–36.0)
MCV: 88.4 fL (ref 79.5–101.0)
MONO#: 0.3 10*3/uL (ref 0.1–0.9)
MONO%: 11.9 % (ref 0.0–14.0)
NEUT%: 70.6 % (ref 38.4–76.8)
NEUTROS ABS: 1.9 10*3/uL (ref 1.5–6.5)
PLATELETS: 230 10*3/uL (ref 145–400)
RBC: 3.61 10*6/uL — AB (ref 3.70–5.45)
RDW: 21 % — ABNORMAL HIGH (ref 11.2–14.5)
WBC: 2.7 10*3/uL — ABNORMAL LOW (ref 3.9–10.3)
lymph#: 0.5 10*3/uL — ABNORMAL LOW (ref 0.9–3.3)

## 2017-05-02 LAB — COMPREHENSIVE METABOLIC PANEL
ALBUMIN: 3.2 g/dL — AB (ref 3.5–5.0)
ALK PHOS: 87 U/L (ref 40–150)
ALT: 8 U/L (ref 0–55)
AST: 10 U/L (ref 5–34)
Anion Gap: 12 mEq/L — ABNORMAL HIGH (ref 3–11)
BUN: 7.9 mg/dL (ref 7.0–26.0)
CALCIUM: 9.5 mg/dL (ref 8.4–10.4)
CHLORIDE: 95 meq/L — AB (ref 98–109)
CO2: 29 mEq/L (ref 22–29)
CREATININE: 0.7 mg/dL (ref 0.6–1.1)
EGFR: >90 mL/min/{1.73_m2} (ref >90)
GLUCOSE: 83 mg/dL (ref 70–140)
Potassium: 3.6 mEq/L (ref 3.5–5.1)
Sodium: 136 mEq/L (ref 136–145)
Total Bilirubin: 0.53 mg/dL (ref 0.20–1.20)
Total Protein: 7.5 g/dL (ref 6.4–8.3)

## 2017-05-02 MED ORDER — SODIUM CHLORIDE 0.9 % IV SOLN
20.0000 mg | Freq: Once | INTRAVENOUS | Status: AC
  Administered 2017-05-02: 20 mg via INTRAVENOUS
  Filled 2017-05-02: qty 2

## 2017-05-02 MED ORDER — FAMOTIDINE IN NACL 20-0.9 MG/50ML-% IV SOLN
INTRAVENOUS | Status: AC
  Filled 2017-05-02: qty 50

## 2017-05-02 MED ORDER — PACLITAXEL CHEMO INJECTION 300 MG/50ML
45.0000 mg/m2 | Freq: Once | INTRAVENOUS | Status: AC
  Administered 2017-05-02: 84 mg via INTRAVENOUS
  Filled 2017-05-02: qty 14

## 2017-05-02 MED ORDER — CARBOPLATIN CHEMO INJECTION 450 MG/45ML
237.0000 mg | Freq: Once | INTRAVENOUS | Status: AC
  Administered 2017-05-02: 240 mg via INTRAVENOUS
  Filled 2017-05-02: qty 24

## 2017-05-02 MED ORDER — SODIUM CHLORIDE 0.9 % IV SOLN
Freq: Once | INTRAVENOUS | Status: AC
  Administered 2017-05-02: 13:00:00 via INTRAVENOUS

## 2017-05-02 MED ORDER — FAMOTIDINE IN NACL 20-0.9 MG/50ML-% IV SOLN
20.0000 mg | Freq: Once | INTRAVENOUS | Status: AC
  Administered 2017-05-02: 20 mg via INTRAVENOUS

## 2017-05-02 MED ORDER — PALONOSETRON HCL INJECTION 0.25 MG/5ML
INTRAVENOUS | Status: AC
  Filled 2017-05-02: qty 5

## 2017-05-02 MED ORDER — DIPHENHYDRAMINE HCL 50 MG/ML IJ SOLN
50.0000 mg | Freq: Once | INTRAMUSCULAR | Status: AC
  Administered 2017-05-02: 50 mg via INTRAVENOUS

## 2017-05-02 MED ORDER — DIPHENHYDRAMINE HCL 50 MG/ML IJ SOLN
INTRAMUSCULAR | Status: AC
  Filled 2017-05-02: qty 1

## 2017-05-02 MED ORDER — PALONOSETRON HCL INJECTION 0.25 MG/5ML
0.2500 mg | Freq: Once | INTRAVENOUS | Status: AC
  Administered 2017-05-02: 0.25 mg via INTRAVENOUS

## 2017-05-02 NOTE — Telephone Encounter (Signed)
Gave avs and calendar for october °

## 2017-05-02 NOTE — Patient Instructions (Signed)
[facility] Discharge Instructions for Patients Receiving Chemotherapy  Today you received the following chemotherapy agents: Taxol and Carboplatin.  To help prevent nausea and vomiting after your treatment, we encourage you to take your nausea medication: Compazine. Take one every 6 hours as needed.  If you develop nausea and vomiting that is not controlled by your nausea medication, call the clinic.   BELOW ARE SYMPTOMS THAT SHOULD BE REPORTED IMMEDIATELY:  *FEVER GREATER THAN 100.5 F  *CHILLS WITH OR WITHOUT FEVER  NAUSEA AND VOMITING THAT IS NOT CONTROLLED WITH YOUR NAUSEA MEDICATION  *UNUSUAL SHORTNESS OF BREATH  *UNUSUAL BRUISING OR BLEEDING  TENDERNESS IN MOUTH AND THROAT WITH OR WITHOUT PRESENCE OF ULCERS  *URINARY PROBLEMS  *BOWEL PROBLEMS  UNUSUAL RASH Items with * indicate a potential emergency and should be followed up as soon as possible.  Feel free to call the clinic should you have any questions or concerns. The clinic phone number is [phone].  Please show the [name] at check-in to the Emergency Department and triage nurse.

## 2017-05-02 NOTE — Assessment & Plan Note (Signed)
This is a 61 year old female with stage IIIa (T2b, N2, M0) non-small cell lung cancer, squamous cell carcinoma presented with large right upper lobe lung mass in addition to right hilar and mediastinal lymphadenopathy diagnosed in July 2018. PDL 1 expression is negative. Patient is currently undergoing a course of concurrent chemoradiation with weekly carboplatin for an AUC of 2 and paclitaxel 45 mg meter squared. Tolerating this well with the exception of fatigue, nausea, and decreased appetite.  The patient was seen with Dr. Julien Nordmann. Recommend that she proceed with cycle 6 of her chemotherapy as scheduled today. Her last dose of radiation is scheduled for next Monday, 05/09/2017. Since she only has 1 day of radiation next week and the fact that she is more fatigued and having ongoing nausea and decreased appetite, this will be her last cycle of chemotherapy. The patient will have a restaging CT scan of her chest in approximate 4-5 weeks. Follow-up visit will be 1-2 days after her scan to review these results.  The patient was encouraged to take her Compazine on a regular basis.

## 2017-05-02 NOTE — Progress Notes (Signed)
[facility] Follow up:    [name], MD [place] [address]   DIAGNOSIS: Stage IIIa (T2b, N2, M0) non-small cell lung cancer, squamous cell carcinoma presented with large right upper lobe lung mass in addition to right hilar and mediastinal lymphadenopathy diagnosed in July 2018. PDL 1 expression is negative  SUMMARY OF ONCOLOGIC HISTORY: Oncology History   Patient presented with incidental finding on pre-op CXR for a breast biopsy.     Lung mass   12/08/2016 Imaging    CXR IMPRESSION: 5 cm masslike opacity within the right upper lobe -chest CT with contrast is recommended for further evaluation.      02/01/2017 Initial Diagnosis    Lung mass     02/11/2017 Imaging    PET IMPRESSION: 1. Hypermetabolic RIGHT upper lobe mass consists with bronchogenic carcinoma. 2. Hypermetabolic RIGHT hilar and ipsilateral RIGHT paratracheal metastatic adenopathy. 3. No contralateral nodal metastasis or supraclavicular nodal metastasis.      02/28/2017 Surgery    Operation: Flexible video fiberoptic bronchoscopy with endobronchial ultrasound and biopsies.      02/28/2017 Pathology Results    Cytology Diagnosis BRONCHIAL BRUSHING SPECIMEN D, RIGHT UPPER LOBE (SPECIMEN 4 OF 4 COLLECTED 02/28/2017) MALIGNANT CELLS CONSISTENT WITH SQUAMOUS CELL CARCINOMA.        CURRENT THERAPY: Current chemoradiation with weekly carboplatin for an AUC of 2 and paclitaxel 45 mg meter squared. First dose was given on 03/21/2017. Status post five cycles of her chemotherapy.  INTERVAL HISTORY: EVANGELENE VORA 61 y.o. female returns for routine follow-up with her friend. The patient is reporting that she is having more fatigue, nausea, and decreased appetite related to her chemotherapy and radiation therapy. She reports some pain with swallowing. She has Carafate which she is using home. Denies fevers and chills. Denies chest pain. She has shortness of breath and  wears oxygen at 2 L/m. She reports a cough, but no hemoptysis. Reports nausea with intermittent vomiting. Uses Compazine as needed. Has had loose stools intermittently and uses Imodium which is effective. He patient is here for evaluation prior to cycle 6 of her chemotherapy today.   Patient Active Problem List   Diagnosis Date Noted  . Stage III squamous cell carcinoma of right lung ([place]) 03/10/2017  . Goals of care, counseling/discussion 03/10/2017  . Encounter for antineoplastic chemotherapy 03/10/2017  . Lung mass 02/01/2017  . Breast mass, right   . Hx of adenomatous colonic polyps 12/28/2016  . Fracture of fifth metatarsal bone of right foot with delayed healing 02/03/2016  . IBS (irritable bowel syndrome) 07/30/2015  . Diarrhea 10/29/2014  . Hoarseness 10/29/2014  . Chronic hoarseness 01/16/2014  . Other dysphagia 01/16/2014  . Dysphagia, unspecified(787.20) 06/04/2013  . Unspecified constipation 06/04/2013  . Unsteady gait 01/25/2013  . COPD (chronic obstructive pulmonary disease) with emphysema ([place]) 12/26/2012  . DOE (dyspnea on exertion) 12/01/2012  . 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 Dysphagia 11/09/2012  . COPD exacerbation ([place]) 08/07/2011  . Hypokalemia 08/07/2011  . Myositis 08/07/2011  . Dehydration 08/07/2011  . ARF (acute renal failure) ([place]) 08/07/2011  . Tubular adenoma 07/20/2011  . INSOMNIA 02/26/2008  . CERUMEN IMPACTION, RIGHT 01/23/2008  . 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 NECK PAIN 11/22/2007  . HAMMER TOE 11/22/2007  . ANXIETY 01/30/2007  . TOBACCO ABUSE 01/30/2007  . DEPRESSION 01/30/2007  . GERD 01/30/2007  . PROLAPSE, VAGINAL WALL, CYSTOCELE, MIDLINE 01/10/2007  . INCONTINENCE 01/10/2007  . SEBACEOUS CYST  08/17/2006  . Breast cancer, right breast ([place]) 12/01/2003    is allergic to penicillins.  MEDICAL HISTORY: Past Medical History:  Diagnosis Date  . Anxiety   . ARF (acute  renal failure) ([place]) 08/07/2011   ?   Marland Kitchen Arthritis   . Cancer ([place]) 2005   breast-right  . Chronic back pain   . COPD (chronic obstructive pulmonary disease) ([place])   . Depression   . Depression with anxiety   . Dyspnea    on exertion  . Fibromyalgia   . GERD (gastroesophageal reflux disease)   . Hypercholesteremia   . Hypothyroidism   . Incontinence   . Neuropathy   . Peripheral neuropathy   . Sciatic pain   . Sebaceous cyst    lt labial location  . Sleep apnea    uses CPAP  . Stage III squamous cell carcinoma of right lung ([place]) 03/10/2017  . Tobacco abuse     SURGICAL HISTORY: Past Surgical History:  Procedure Laterality Date  . bladder tack    . BREAST BIOPSY Right 12/29/2016   Procedure: RIGHT BREAST BIOPSY;  Surgeon: Aviva Signs, MD;  Location: [facility];  Service: General;  Laterality: Right;  . BREAST LUMPECTOMY     right with node dissection  . COLONOSCOPY  11/11/2010   VCB:SWHQPRFFMB rectal polyp and splenic flexure otherwise normal; pathology showed  tubular adenomas. Next TCS 10/2015.  Marland Kitchen ESOPHAGOGASTRODUODENOSCOPY  11/11/2010   WGY:KZLDJ hiatal hernia, 37 French dilator  . ESOPHAGOGASTRODUODENOSCOPY (EGD) WITH ESOPHAGEAL DILATION N/A 06/21/2013   TTS:VXBLTJ esophagus  - status post Maloney dilation s/p bx (benign esophageal bx)  . VIDEO BRONCHOSCOPY WITH ENDOBRONCHIAL ULTRASOUND N/A 02/28/2017   Procedure: VIDEO BRONCHOSCOPY WITH ENDOBRONCHIAL ULTRASOUND;  Surgeon: Juanito Doom, MD;  Location: [facility];  Service: Thoracic;  Laterality: N/A;    SOCIAL HISTORY: Social History   Social History  . Marital status: Married    Spouse name: N/A  . Number of children: 1  . Years of education: N/A   Occupational History  . disabled Unemployed   Social History Main Topics  . Smoking status: Current Every Day Smoker    Packs/day: 1.00    Years: 35.00    Types: Cigarettes  . Smokeless tobacco: Never Used     Comment: has cut back to one pack daily  . Alcohol use  No  . Drug use: No  . Sexual activity: Yes    Birth control/ protection: Post-menopausal     Comment: chemo stopped her periods   Other Topics Concern  . Not on file   Social History Narrative   Lives w. Husband   Lost 1 son-?etiology    FAMILY HISTORY: Family History  Problem Relation Age of Onset  . Cancer Other   . Heart attack Other   . Lung disease Other   . Emphysema Mother     Review of Systems  Constitutional: Positive for appetite change, fatigue and unexpected weight change. Negative for chills and fever.  HENT:   Positive for sore throat. Negative for mouth sores.   Eyes: Negative.   Respiratory: Positive for cough. Negative for hemoptysis.        Reports shortness of breath. Uses oxygen at 2 L/m.  Cardiovascular: Negative.   Gastrointestinal: Positive for diarrhea and nausea. Negative for abdominal pain, constipation and rectal pain.       Intermittent vomiting. Uses Compazine which is effective.  Genitourinary: Negative.    Musculoskeletal: Negative.   Skin: Negative.  Neurological: Negative.   Hematological: Negative.   Psychiatric/Behavioral: Negative.       PHYSICAL EXAMINATION  ECOG PERFORMANCE STATUS: 1 - Symptomatic but completely ambulatory  Vitals:   05/02/17 1048  BP: 102/61  Pulse: (!) 110  Resp: 18  Temp: 98.6 F (37 C)  SpO2: 100%    Physical Exam  Constitutional: She is oriented to person, place, and time.  Tired-appearing female who is in no acute distress.  HENT:  Head: Normocephalic.  Mouth/Throat: Oropharynx is clear and moist. No oropharyngeal exudate.  Eyes: Conjunctivae are normal. Right eye exhibits no discharge. Left eye exhibits no discharge. No scleral icterus.  Neck: Normal range of motion. Neck supple.  Cardiovascular: Normal rate, regular rhythm, normal heart sounds and intact distal pulses.   Pulmonary/Chest: Effort normal. No respiratory distress. She has no wheezes. She has no rales.  Diminished breath  sounds.  Abdominal: Soft. Bowel sounds are normal. She exhibits no distension and no mass. There is no tenderness.  Musculoskeletal: Normal range of motion. She exhibits no edema.  Lymphadenopathy:    She has no cervical adenopathy.  Neurological: She is alert and oriented to person, place, and time. She exhibits normal muscle tone.  Skin: Skin is warm and dry. No rash noted. No erythema. No pallor.  Psychiatric: Mood, memory, affect and judgment normal.  Vitals reviewed.   LABORATORY DATA:  CBC    Component Value Date/Time   WBC 2.7 (L) 05/02/2017 1012   WBC 11.1 (H) 02/25/2017 1234   RBC 3.61 (L) 05/02/2017 1012   RBC 5.17 (H) 02/25/2017 1234   HGB 10.1 (L) 05/02/2017 1012   HCT 31.9 (L) 05/02/2017 1012   PLT 230 05/02/2017 1012   MCV 88.4 05/02/2017 1012   MCH 28.0 05/02/2017 1012   MCH 26.6 02/24/2017 0607   MCHC 31.7 05/02/2017 1012   MCHC 32.3 02/25/2017 1234   RDW 21.0 (H) 05/02/2017 1012   LYMPHSABS 0.5 (L) 05/02/2017 1012   MONOABS 0.3 05/02/2017 1012   EOSABS 0.0 05/02/2017 1012   BASOSABS 0.0 05/02/2017 1012    CMP     Component Value Date/Time   NA 136 05/02/2017 1012   K 3.6 05/02/2017 1012   CL 99 02/25/2017 1234   CO2 29 05/02/2017 1012   GLUCOSE 83 05/02/2017 1012   BUN 7.9 05/02/2017 1012   CREATININE 0.7 05/02/2017 1012   CALCIUM 9.5 05/02/2017 1012   PROT 7.5 05/02/2017 1012   ALBUMIN 3.2 (L) 05/02/2017 1012   AST 10 05/02/2017 1012   ALT 8 05/02/2017 1012   ALKPHOS 87 05/02/2017 1012   BILITOT 0.53 05/02/2017 1012   GFRNONAA >60 02/24/2017 0607   GFRAA >60 02/24/2017 3335    RADIOGRAPHIC STUDIES:  No results found.  ASSESSMENT and THERAPY PLAN:   Stage III squamous cell carcinoma of right lung [facility]) This is a 61 year old female with stage IIIa (T2b, N2, M0) non-small cell lung cancer, squamous cell carcinoma presented with large right upper lobe lung mass in addition to right hilar and mediastinal lymphadenopathy diagnosed in July  2018. PDL 1 expression is negative. Patient is currently undergoing a course of concurrent chemoradiation with weekly carboplatin for an AUC of 2 and paclitaxel 45 mg meter squared. Tolerating this well with the exception of fatigue, nausea, and decreased appetite.  The patient was seen with Dr. Julien Nordmann. Recommend that she proceed with cycle 6 of her chemotherapy as scheduled today. Her last dose of radiation is scheduled for next Monday, 05/09/2017. Since she  only has 1 day of radiation next week and the fact that she is more fatigued and having ongoing nausea and decreased appetite, this will be her last cycle of chemotherapy. The patient will have a restaging CT scan of her chest in approximate 4-5 weeks. Follow-up visit will be 1-2 days after her scan to review these results.  The patient was encouraged to take her Compazine on a regular basis.      Orders Placed This Encounter  Procedures  . CT CHEST W CONTRAST    Standing Status:   Future    Standing Expiration Date:   05/02/2018    Order Specific Question:   If indicated for the ordered procedure, I authorize the administration of contrast media per Radiology protocol    Answer:   Yes    Order Specific Question:   Preferred imaging location?    Answer:   [facility]    Order Specific Question:   Radiology Contrast Protocol - do NOT remove file path    Answer:   [email or web address]    Order Specific Question:   Reason for Exam additional comments    Answer:   Lung cancer. S/P concurrent chemoradiation. Restaging.    Order Specific Question:   Is patient pregnant?    Answer:   No  . CBC with Differential/Platelet    Standing Status:   Future    Standing Expiration Date:   05/02/2018  . Comprehensive metabolic panel    Standing Status:   Future    Standing Expiration Date:   05/02/2018    All questions were answered. The patient knows to call the clinic with any problems, questions or concerns. We  can certainly see the patient much sooner if necessary.  Mikey Bussing, NP 05/02/2017   ADDENDUM: Hematology/Oncology Attending: I had a face to face encounter with the patient today. I recommended her care plan. This is a very pleasant 61 years old white female with a stage IIIa non-small cell lung cancer currently undergoing a course of concurrent chemoradiation with weekly carboplatin and paclitaxel status post 5 cycles. She continues to tolerate her treatment fairly well except for mild odynophagia and shortness of breath at baseline and increased with exertion. She is currently on home oxygen. She is expected to complete the course of concurrent radiotherapy on 05/09/2017. I recommended for the patient to proceed with cycle #6 today as scheduled. I will see her back for follow-up visit in 5 weeks for evaluation after repeating CT scan of the chest for restaging of her disease. Patient was advised to call immediately if she has any concerning symptoms in the interval.  Disclaimer: This note was dictated with voice recognition software. Similar sounding words can inadvertently be transcribed and may be missed upon review. Eilleen Kempf, MD 05/02/17

## 2017-05-03 ENCOUNTER — Other Ambulatory Visit: Payer: Self-pay | Admitting: Radiation Oncology

## 2017-05-03 ENCOUNTER — Ambulatory Visit
Admission: RE | Admit: 2017-05-03 | Discharge: 2017-05-03 | Disposition: A | Discharge status: Home / Self Care | Payer: Medicaid Other | Source: Ambulatory Visit | Attending: Radiation Oncology | Admitting: Radiation Oncology

## 2017-05-03 DIAGNOSIS — C3491 Malignant neoplasm of unspecified part of right bronchus or lung: Secondary | ICD-10-CM

## 2017-05-03 DIAGNOSIS — Z510 Encounter for antineoplastic radiation therapy: Secondary | ICD-10-CM | POA: Diagnosis not present

## 2017-05-03 MED ORDER — RADIAPLEXRX EX GEL: Freq: Once | CUTANEOUS | Status: DC

## 2017-05-03 MED ORDER — SUCRALFATE 1 G PO TABS: 1.0000 g | ORAL_TABLET | Freq: Three times a day (TID) | ORAL | 1 refills | Status: DC

## 2017-05-03 MED ORDER — SONAFINE EX EMUL
1.0000 "application " | Freq: Once | CUTANEOUS | Status: AC
  Administered 2017-05-03: 1 via TOPICAL

## 2017-05-03 MED ORDER — ALRA NON-METALLIC DEODORANT (RAD-ONC): 1.0000 "application " | Freq: Once | TOPICAL | Status: DC

## 2017-05-04 ENCOUNTER — Ambulatory Visit
Admission: RE | Admit: 2017-05-04 | Discharge: 2017-05-04 | Disposition: A | Discharge status: Home / Self Care | Payer: Medicaid Other | Source: Ambulatory Visit | Attending: Radiation Oncology | Admitting: Radiation Oncology

## 2017-05-04 DIAGNOSIS — Z510 Encounter for antineoplastic radiation therapy: Secondary | ICD-10-CM | POA: Diagnosis not present

## 2017-05-05 ENCOUNTER — Telehealth: Payer: Self-pay | Admitting: Oncology

## 2017-05-05 ENCOUNTER — Ambulatory Visit: Payer: Medicaid Other

## 2017-05-05 NOTE — Telephone Encounter (Signed)
Called patient due to her canceling radiation today.  She said she was feeling dizzy and slid off a chair at home.  She denies feeling dehydrated and said she is drinking a lot of water.  She said she thinks the dizziness is from her medications and said it has happened before.  She denied having any injuries from sliding off the chair and stated that she does not need to see a doctor today.  She will be in for treatment tomorrow.

## 2017-05-06 ENCOUNTER — Ambulatory Visit: Payer: Medicaid Other

## 2017-05-09 ENCOUNTER — Ambulatory Visit: Payer: Medicaid Other

## 2017-05-09 ENCOUNTER — Ambulatory Visit
Admission: RE | Admit: 2017-05-09 | Discharge: 2017-05-09 | Disposition: A | Discharge status: Home / Self Care | Payer: Medicaid Other | Source: Ambulatory Visit | Attending: Radiation Oncology | Admitting: Radiation Oncology

## 2017-05-09 DIAGNOSIS — Z510 Encounter for antineoplastic radiation therapy: Secondary | ICD-10-CM | POA: Diagnosis not present

## 2017-05-10 ENCOUNTER — Telehealth: Payer: Self-pay | Admitting: Oncology

## 2017-05-10 ENCOUNTER — Ambulatory Visit: Payer: Medicaid Other

## 2017-05-10 NOTE — Telephone Encounter (Signed)
Patient called and said that she has been having "blacking out" episodes.  She said it happened last week when she slid of a chair at home and again yesterday walking in her yard.  She said her blood pressure has been OK and was good at her doctor's office yesterday.  She is very scared because her family member died of a stroke during radiation.

## 2017-05-10 NOTE — Telephone Encounter (Signed)
Called patient back and advised her that Dr. Sondra Come has been notified and will see her tomorrow.  Also advised her to call 911 tonight if it happens again.  She verbalized understanding and agreement.

## 2017-05-11 ENCOUNTER — Ambulatory Visit
Admission: RE | Admit: 2017-05-11 | Discharge: 2017-05-11 | Disposition: A | Discharge status: Home / Self Care | Payer: Medicaid Other | Source: Ambulatory Visit | Attending: Radiation Oncology | Admitting: Radiation Oncology

## 2017-05-11 ENCOUNTER — Ambulatory Visit: Payer: Medicaid Other

## 2017-05-11 DIAGNOSIS — Z510 Encounter for antineoplastic radiation therapy: Secondary | ICD-10-CM | POA: Diagnosis not present

## 2017-05-12 ENCOUNTER — Ambulatory Visit
Admission: RE | Admit: 2017-05-12 | Discharge: 2017-05-12 | Disposition: A | Discharge status: Home / Self Care | Payer: Medicaid Other | Source: Ambulatory Visit | Attending: Radiation Oncology | Admitting: Radiation Oncology

## 2017-05-12 DIAGNOSIS — Z510 Encounter for antineoplastic radiation therapy: Secondary | ICD-10-CM | POA: Diagnosis not present

## 2017-05-16 ENCOUNTER — Encounter (HOSPITAL_COMMUNITY): Payer: Self-pay | Admitting: Emergency Medicine

## 2017-05-16 ENCOUNTER — Inpatient Hospital Stay (HOSPITAL_COMMUNITY): Payer: Medicaid Other

## 2017-05-16 ENCOUNTER — Inpatient Hospital Stay (HOSPITAL_COMMUNITY)
Admission: EM | Admit: 2017-05-16 | Discharge: 2017-05-21 | DRG: 871 | Disposition: A | Discharge status: Home / Self Care | Payer: Medicaid Other | Attending: Internal Medicine | Admitting: Internal Medicine

## 2017-05-16 ENCOUNTER — Emergency Department (HOSPITAL_COMMUNITY): Payer: Medicaid Other

## 2017-05-16 ENCOUNTER — Encounter: Payer: Self-pay | Admitting: Radiation Oncology

## 2017-05-16 DIAGNOSIS — G8929 Other chronic pain: Secondary | ICD-10-CM | POA: Diagnosis not present

## 2017-05-16 DIAGNOSIS — R1310 Dysphagia, unspecified: Secondary | ICD-10-CM | POA: Diagnosis present

## 2017-05-16 DIAGNOSIS — D649 Anemia, unspecified: Secondary | ICD-10-CM | POA: Diagnosis not present

## 2017-05-16 DIAGNOSIS — Z7982 Long term (current) use of aspirin: Secondary | ICD-10-CM | POA: Diagnosis not present

## 2017-05-16 DIAGNOSIS — F32A Depression, unspecified: Secondary | ICD-10-CM

## 2017-05-16 DIAGNOSIS — F419 Anxiety disorder, unspecified: Secondary | ICD-10-CM | POA: Diagnosis present

## 2017-05-16 DIAGNOSIS — G629 Polyneuropathy, unspecified: Secondary | ICD-10-CM | POA: Diagnosis present

## 2017-05-16 DIAGNOSIS — Z853 Personal history of malignant neoplasm of breast: Secondary | ICD-10-CM | POA: Diagnosis not present

## 2017-05-16 DIAGNOSIS — R112 Nausea with vomiting, unspecified: Secondary | ICD-10-CM

## 2017-05-16 DIAGNOSIS — E860 Dehydration: Secondary | ICD-10-CM | POA: Diagnosis present

## 2017-05-16 DIAGNOSIS — J15211 Pneumonia due to Methicillin susceptible Staphylococcus aureus: Secondary | ICD-10-CM | POA: Diagnosis present

## 2017-05-16 DIAGNOSIS — F17200 Nicotine dependence, unspecified, uncomplicated: Secondary | ICD-10-CM

## 2017-05-16 DIAGNOSIS — J9611 Chronic respiratory failure with hypoxia: Secondary | ICD-10-CM | POA: Diagnosis present

## 2017-05-16 DIAGNOSIS — L89302 Pressure ulcer of unspecified buttock, stage 2: Secondary | ICD-10-CM | POA: Diagnosis present

## 2017-05-16 DIAGNOSIS — R197 Diarrhea, unspecified: Secondary | ICD-10-CM

## 2017-05-16 DIAGNOSIS — L8990 Pressure ulcer of unspecified site, unspecified stage: Secondary | ICD-10-CM | POA: Insufficient documentation

## 2017-05-16 DIAGNOSIS — E785 Hyperlipidemia, unspecified: Secondary | ICD-10-CM | POA: Diagnosis present

## 2017-05-16 DIAGNOSIS — J449 Chronic obstructive pulmonary disease, unspecified: Secondary | ICD-10-CM

## 2017-05-16 DIAGNOSIS — N179 Acute kidney failure, unspecified: Secondary | ICD-10-CM | POA: Diagnosis not present

## 2017-05-16 DIAGNOSIS — E874 Mixed disorder of acid-base balance: Secondary | ICD-10-CM | POA: Diagnosis not present

## 2017-05-16 DIAGNOSIS — Z880 Allergy status to penicillin: Secondary | ICD-10-CM

## 2017-05-16 DIAGNOSIS — G4730 Sleep apnea, unspecified: Secondary | ICD-10-CM | POA: Diagnosis present

## 2017-05-16 DIAGNOSIS — F17210 Nicotine dependence, cigarettes, uncomplicated: Secondary | ICD-10-CM | POA: Diagnosis present

## 2017-05-16 DIAGNOSIS — R938 Abnormal findings on diagnostic imaging of other specified body structures: Secondary | ICD-10-CM | POA: Diagnosis not present

## 2017-05-16 DIAGNOSIS — J9622 Acute and chronic respiratory failure with hypercapnia: Secondary | ICD-10-CM | POA: Diagnosis present

## 2017-05-16 DIAGNOSIS — F329 Major depressive disorder, single episode, unspecified: Secondary | ICD-10-CM | POA: Diagnosis present

## 2017-05-16 DIAGNOSIS — R740 Nonspecific elevation of levels of transaminase and lactic acid dehydrogenase [LDH]: Secondary | ICD-10-CM

## 2017-05-16 DIAGNOSIS — D696 Thrombocytopenia, unspecified: Secondary | ICD-10-CM | POA: Diagnosis not present

## 2017-05-16 DIAGNOSIS — J440 Chronic obstructive pulmonary disease with acute lower respiratory infection: Secondary | ICD-10-CM | POA: Diagnosis present

## 2017-05-16 DIAGNOSIS — C3491 Malignant neoplasm of unspecified part of right bronchus or lung: Secondary | ICD-10-CM | POA: Diagnosis present

## 2017-05-16 DIAGNOSIS — K759 Inflammatory liver disease, unspecified: Secondary | ICD-10-CM | POA: Diagnosis not present

## 2017-05-16 DIAGNOSIS — E039 Hypothyroidism, unspecified: Secondary | ICD-10-CM | POA: Diagnosis present

## 2017-05-16 DIAGNOSIS — R9389 Abnormal findings on diagnostic imaging of other specified body structures: Secondary | ICD-10-CM

## 2017-05-16 DIAGNOSIS — K219 Gastro-esophageal reflux disease without esophagitis: Secondary | ICD-10-CM | POA: Diagnosis present

## 2017-05-16 DIAGNOSIS — R6510 Systemic inflammatory response syndrome (SIRS) of non-infectious origin without acute organ dysfunction: Secondary | ICD-10-CM

## 2017-05-16 DIAGNOSIS — M797 Fibromyalgia: Secondary | ICD-10-CM | POA: Diagnosis present

## 2017-05-16 DIAGNOSIS — E876 Hypokalemia: Secondary | ICD-10-CM | POA: Diagnosis present

## 2017-05-16 DIAGNOSIS — E7800 Pure hypercholesterolemia, unspecified: Secondary | ICD-10-CM | POA: Diagnosis present

## 2017-05-16 DIAGNOSIS — Z923 Personal history of irradiation: Secondary | ICD-10-CM | POA: Diagnosis not present

## 2017-05-16 DIAGNOSIS — J9621 Acute and chronic respiratory failure with hypoxia: Secondary | ICD-10-CM | POA: Diagnosis present

## 2017-05-16 DIAGNOSIS — A419 Sepsis, unspecified organism: Secondary | ICD-10-CM | POA: Diagnosis present

## 2017-05-16 DIAGNOSIS — J189 Pneumonia, unspecified organism: Secondary | ICD-10-CM

## 2017-05-16 DIAGNOSIS — G9349 Other encephalopathy: Secondary | ICD-10-CM | POA: Diagnosis present

## 2017-05-16 DIAGNOSIS — R7401 Elevation of levels of liver transaminase levels: Secondary | ICD-10-CM

## 2017-05-16 DIAGNOSIS — Z9911 Dependence on respirator [ventilator] status: Secondary | ICD-10-CM

## 2017-05-16 DIAGNOSIS — R9431 Abnormal electrocardiogram [ECG] [EKG]: Secondary | ICD-10-CM | POA: Diagnosis not present

## 2017-05-16 DIAGNOSIS — J9602 Acute respiratory failure with hypercapnia: Secondary | ICD-10-CM | POA: Diagnosis not present

## 2017-05-16 DIAGNOSIS — R4182 Altered mental status, unspecified: Secondary | ICD-10-CM

## 2017-05-16 LAB — COMPREHENSIVE METABOLIC PANEL
ALBUMIN: 3.1 g/dL — AB (ref 3.5–5.0)
ALK PHOS: 106 U/L (ref 38–126)
ALT: 594 U/L — AB (ref 14–54)
ANION GAP: 14 (ref 5–15)
AST: 854 U/L — ABNORMAL HIGH (ref 15–41)
BILIRUBIN TOTAL: 1.3 mg/dL — AB (ref 0.3–1.2)
BUN: 14 mg/dL (ref 6–20)
CALCIUM: 7.6 mg/dL — AB (ref 8.9–10.3)
CO2: 31 mmol/L (ref 22–32)
CREATININE: 0.76 mg/dL (ref 0.44–1.00)
Chloride: 95 mmol/L — ABNORMAL LOW (ref 101–111)
GFR calc non Af Amer: >60 mL/min (ref >60)
GLUCOSE: 81 mg/dL (ref 65–99)
Potassium: 3.1 mmol/L — ABNORMAL LOW (ref 3.5–5.1)
Sodium: 140 mmol/L (ref 135–145)
TOTAL PROTEIN: 6.8 g/dL (ref 6.5–8.1)

## 2017-05-16 LAB — BLOOD GAS, ARTERIAL
Acid-Base Excess: 0 mmol/L (ref 0.0–2.0)
Bicarbonate: 28.7 mmol/L — ABNORMAL HIGH (ref 20.0–28.0)
DRAWN BY: 232811
FIO2: 100
MECHVT: 420 mL
O2 SAT: 99.4 %
PATIENT TEMPERATURE: 99
PCO2 ART: 76.4 mmHg — AB (ref 32.0–48.0)
PEEP: 5 cmH2O
PH ART: 7.201 — AB (ref 7.350–7.450)
PO2 ART: 324 mmHg — AB (ref 83.0–108.0)
RATE: 18 resp/min

## 2017-05-16 LAB — CBC WITH DIFFERENTIAL/PLATELET
BASOS ABS: 0 10*3/uL (ref 0.0–0.1)
BASOS PCT: 0 %
Eosinophils Absolute: 0 10*3/uL (ref 0.0–0.7)
Eosinophils Relative: 0 %
HEMATOCRIT: 27.9 % — AB (ref 36.0–46.0)
HEMOGLOBIN: 9 g/dL — AB (ref 12.0–15.0)
LYMPHS PCT: 3 %
Lymphs Abs: 0.3 10*3/uL — ABNORMAL LOW (ref 0.7–4.0)
MCH: 28.5 pg (ref 26.0–34.0)
MCHC: 32.3 g/dL (ref 30.0–36.0)
MCV: 88.3 fL (ref 78.0–100.0)
Monocytes Absolute: 0.5 10*3/uL (ref 0.1–1.0)
Monocytes Relative: 5 %
NEUTROS ABS: 8.5 10*3/uL — AB (ref 1.7–7.7)
Neutrophils Relative %: 92 %
Platelets: 121 10*3/uL — ABNORMAL LOW (ref 150–400)
RBC: 3.16 MIL/uL — ABNORMAL LOW (ref 3.87–5.11)
RDW: 24 % — ABNORMAL HIGH (ref 11.5–15.5)
WBC: 9.3 10*3/uL (ref 4.0–10.5)

## 2017-05-16 LAB — URINALYSIS, ROUTINE W REFLEX MICROSCOPIC
BILIRUBIN URINE: NEGATIVE
Glucose, UA: NEGATIVE mg/dL
HGB URINE DIPSTICK: NEGATIVE
Ketones, ur: 5 mg/dL — AB
Leukocytes, UA: NEGATIVE
Nitrite: NEGATIVE
PH: 6 (ref 5.0–8.0)
Protein, ur: NEGATIVE mg/dL
SPECIFIC GRAVITY, URINE: 1.004 — AB (ref 1.005–1.030)

## 2017-05-16 LAB — BLOOD GAS, VENOUS
Acid-Base Excess: 4.4 mmol/L — ABNORMAL HIGH (ref 0.0–2.0)
Bicarbonate: 29.9 mmol/L — ABNORMAL HIGH (ref 20.0–28.0)
O2 SAT: 62.3 %
PCO2 VEN: 52.7 mmHg (ref 44.0–60.0)
PO2 VEN: 39.8 mmHg (ref 32.0–45.0)
Patient temperature: 98.6
pH, Ven: 7.372 (ref 7.250–7.430)

## 2017-05-16 LAB — LACTATE DEHYDROGENASE: LDH: 1285 U/L — ABNORMAL HIGH (ref 98–192)

## 2017-05-16 LAB — RAPID URINE DRUG SCREEN, HOSP PERFORMED
Amphetamines: NOT DETECTED
BARBITURATES: NOT DETECTED
Benzodiazepines: NOT DETECTED
Cocaine: NOT DETECTED
Opiates: NOT DETECTED
TETRAHYDROCANNABINOL: NOT DETECTED

## 2017-05-16 LAB — RETICULOCYTES
RBC.: 3.17 MIL/uL — AB (ref 3.87–5.11)
RETIC CT PCT: 4.8 % — AB (ref 0.4–3.1)
Retic Count, Absolute: 152.2 10*3/uL (ref 19.0–186.0)

## 2017-05-16 LAB — PROTIME-INR
INR: 1.19
PROTHROMBIN TIME: 15 s (ref 11.4–15.2)

## 2017-05-16 LAB — I-STAT CG4 LACTIC ACID, ED: Lactic Acid, Venous: 0.97 mmol/L (ref 0.5–1.9)

## 2017-05-16 LAB — I-STAT TROPONIN, ED: TROPONIN I, POC: 0.23 ng/mL — AB (ref 0.00–0.08)

## 2017-05-16 LAB — APTT: aPTT: 28 seconds (ref 24–36)

## 2017-05-16 LAB — LIPASE, BLOOD: LIPASE: 15 U/L (ref 11–51)

## 2017-05-16 LAB — AMMONIA: Ammonia: 10 umol/L (ref 9–35)

## 2017-05-16 LAB — TSH: TSH: 4.115 u[IU]/mL (ref 0.350–4.500)

## 2017-05-16 LAB — TROPONIN I: TROPONIN I: 0.36 ng/mL — AB (ref <0.03)

## 2017-05-16 MED ORDER — HEPARIN BOLUS VIA INFUSION
3500.0000 [IU] | Freq: Once | INTRAVENOUS | Status: AC
Start: 2017-05-16 — End: 2017-05-17
  Administered 2017-05-17: 3500 [IU] via INTRAVENOUS
  Filled 2017-05-16: qty 3500

## 2017-05-16 MED ORDER — LORAZEPAM 2 MG/ML IJ SOLN
1.0000 mg | Freq: Once | INTRAMUSCULAR | Status: AC
  Administered 2017-05-16: 1 mg via INTRAVENOUS
  Filled 2017-05-16: qty 1

## 2017-05-16 MED ORDER — SUCCINYLCHOLINE CHLORIDE 20 MG/ML IJ SOLN
INTRAMUSCULAR | Status: AC | PRN
  Administered 2017-05-16: 100 mg via INTRAVENOUS

## 2017-05-16 MED ORDER — LEVOTHYROXINE SODIUM 100 MCG PO TABS
100.0000 ug | ORAL_TABLET | Freq: Every day | ORAL | Status: DC
  Administered 2017-05-17 – 2017-05-21 (×5): 100 ug via ORAL
  Filled 2017-05-16 (×5): qty 1

## 2017-05-16 MED ORDER — SODIUM CHLORIDE 0.9 % IV BOLUS (SEPSIS)
1000.0000 mL | Freq: Once | INTRAVENOUS | Status: AC
  Administered 2017-05-16: 1000 mL via INTRAVENOUS

## 2017-05-16 MED ORDER — SODIUM CHLORIDE 0.9 % IV SOLN: 250.0000 mL | INTRAVENOUS | Status: DC | PRN

## 2017-05-16 MED ORDER — GABAPENTIN 800 MG PO TABS: 800.0000 mg | ORAL_TABLET | Freq: Four times a day (QID) | ORAL | Status: DC

## 2017-05-16 MED ORDER — SODIUM CHLORIDE 0.9 % IV SOLN
1.0000 g | Freq: Once | INTRAVENOUS | Status: AC
  Administered 2017-05-16: 1 g via INTRAVENOUS
  Filled 2017-05-16: qty 10

## 2017-05-16 MED ORDER — FENTANYL CITRATE (PF) 100 MCG/2ML IJ SOLN
100.0000 ug | INTRAMUSCULAR | Status: DC | PRN
Start: 2017-05-16 — End: 2017-05-17

## 2017-05-16 MED ORDER — IOPAMIDOL (ISOVUE-300) INJECTION 61%
100.0000 mL | Freq: Once | INTRAVENOUS | Status: AC | PRN
  Administered 2017-05-16: 100 mL via INTRAVENOUS

## 2017-05-16 MED ORDER — MOMETASONE FURO-FORMOTEROL FUM 200-5 MCG/ACT IN AERO: 2.0000 | INHALATION_SPRAY | Freq: Two times a day (BID) | RESPIRATORY_TRACT | Status: DC

## 2017-05-16 MED ORDER — DULOXETINE HCL 30 MG PO CPEP: 60.0000 mg | ORAL_CAPSULE | Freq: Every day | ORAL | Status: DC

## 2017-05-16 MED ORDER — BUDESONIDE 0.5 MG/2ML IN SUSP
0.5000 mg | Freq: Two times a day (BID) | RESPIRATORY_TRACT | Status: DC
  Administered 2017-05-17 – 2017-05-20 (×8): 0.5 mg via RESPIRATORY_TRACT
  Filled 2017-05-16 (×9): qty 2

## 2017-05-16 MED ORDER — ALBUTEROL SULFATE HFA 108 (90 BASE) MCG/ACT IN AERS: 2.0000 | INHALATION_SPRAY | Freq: Four times a day (QID) | RESPIRATORY_TRACT | Status: DC | PRN

## 2017-05-16 MED ORDER — SUCRALFATE 1 GM/10ML PO SUSP: 1.0000 g | Freq: Three times a day (TID) | ORAL | Status: DC

## 2017-05-16 MED ORDER — LEVOFLOXACIN IN D5W 750 MG/150ML IV SOLN
750.0000 mg | Freq: Once | INTRAVENOUS | Status: AC
  Administered 2017-05-16: 750 mg via INTRAVENOUS
  Filled 2017-05-16: qty 150

## 2017-05-16 MED ORDER — IOPAMIDOL (ISOVUE-300) INJECTION 61%
INTRAVENOUS | Status: AC
  Administered 2017-05-16: 100 mL via INTRAVENOUS
  Filled 2017-05-16: qty 100

## 2017-05-16 MED ORDER — ASPIRIN 325 MG PO TABS: 325.0000 mg | ORAL_TABLET | Freq: Every day | ORAL | Status: DC

## 2017-05-16 MED ORDER — ASPIRIN 81 MG PO CHEW: 81.0000 mg | CHEWABLE_TABLET | Freq: Every day | ORAL | Status: DC

## 2017-05-16 MED ORDER — IOPAMIDOL (ISOVUE-300) INJECTION 61%
INTRAVENOUS | Status: AC
  Filled 2017-05-16: qty 100

## 2017-05-16 MED ORDER — LORAZEPAM 2 MG/ML IJ SOLN
2.0000 mg | Freq: Once | INTRAMUSCULAR | Status: AC
  Administered 2017-05-16: 2 mg via INTRAVENOUS
  Filled 2017-05-16: qty 1

## 2017-05-16 MED ORDER — IPRATROPIUM-ALBUTEROL 0.5-2.5 (3) MG/3ML IN SOLN
3.0000 mL | Freq: Four times a day (QID) | RESPIRATORY_TRACT | Status: DC
  Administered 2017-05-17 – 2017-05-19 (×9): 3 mL via RESPIRATORY_TRACT
  Filled 2017-05-16 (×10): qty 3

## 2017-05-16 MED ORDER — FENTANYL CITRATE (PF) 100 MCG/2ML IJ SOLN
100.0000 ug | Freq: Once | INTRAMUSCULAR | Status: AC
  Administered 2017-05-16: 100 ug via INTRAVENOUS
  Filled 2017-05-16: qty 2

## 2017-05-16 MED ORDER — VANCOMYCIN HCL IN DEXTROSE 1-5 GM/200ML-% IV SOLN
1000.0000 mg | Freq: Once | INTRAVENOUS | Status: AC
  Administered 2017-05-16: 1000 mg via INTRAVENOUS
  Filled 2017-05-16: qty 200

## 2017-05-16 MED ORDER — LORAZEPAM 2 MG/ML IJ SOLN
0.5000 mg | Freq: Once | INTRAMUSCULAR | Status: AC
  Administered 2017-05-16: 0.5 mg via INTRAVENOUS
  Filled 2017-05-16: qty 1

## 2017-05-16 MED ORDER — POTASSIUM CHLORIDE CRYS ER 20 MEQ PO TBCR
40.0000 meq | EXTENDED_RELEASE_TABLET | ORAL | Status: AC
  Filled 2017-05-16: qty 2

## 2017-05-16 MED ORDER — DEXTROSE 5 % IV SOLN
2.0000 g | Freq: Once | INTRAVENOUS | Status: AC
  Administered 2017-05-16: 2 g via INTRAVENOUS
  Filled 2017-05-16: qty 2

## 2017-05-16 MED ORDER — PANTOPRAZOLE SODIUM 40 MG IV SOLR
40.0000 mg | Freq: Every day | INTRAVENOUS | Status: DC
  Administered 2017-05-17: 40 mg via INTRAVENOUS
  Filled 2017-05-16: qty 40

## 2017-05-16 MED ORDER — SODIUM CHLORIDE 0.9 % IV SOLN: INTRAVENOUS | Status: DC

## 2017-05-16 MED ORDER — SODIUM CHLORIDE 0.9 % IV SOLN
INTRAVENOUS | Status: AC | PRN
  Administered 2017-05-16: 1000 mL via INTRAVENOUS

## 2017-05-16 MED ORDER — NORTRIPTYLINE HCL 25 MG PO CAPS: 25.0000 mg | ORAL_CAPSULE | Freq: Every day | ORAL | Status: DC

## 2017-05-16 MED ORDER — SODIUM CHLORIDE 0.9 % IV BOLUS (SEPSIS)
500.0000 mL | Freq: Once | INTRAVENOUS | Status: AC
  Administered 2017-05-16: 500 mL via INTRAVENOUS

## 2017-05-16 MED ORDER — HEPARIN (PORCINE) IN NACL 100-0.45 UNIT/ML-% IJ SOLN
700.0000 [IU]/h | INTRAMUSCULAR | Status: DC
  Administered 2017-05-17: 700 [IU]/h via INTRAVENOUS
  Filled 2017-05-16: qty 250

## 2017-05-16 MED ORDER — TIOTROPIUM BROMIDE MONOHYDRATE 18 MCG IN CAPS: 18.0000 ug | ORAL_CAPSULE | Freq: Every day | RESPIRATORY_TRACT | Status: DC

## 2017-05-16 MED ORDER — ENOXAPARIN SODIUM 40 MG/0.4ML ~~LOC~~ SOLN
40.0000 mg | SUBCUTANEOUS | Status: DC
  Filled 2017-05-16: qty 0.4

## 2017-05-16 MED ORDER — PANTOPRAZOLE SODIUM 40 MG PO TBEC: 40.0000 mg | DELAYED_RELEASE_TABLET | Freq: Every day | ORAL | Status: DC

## 2017-05-16 MED ORDER — OLOPATADINE HCL 0.1 % OP SOLN
1.0000 [drp] | Freq: Two times a day (BID) | OPHTHALMIC | Status: DC
  Administered 2017-05-17 – 2017-05-21 (×9): 1 [drp] via OPHTHALMIC
  Filled 2017-05-16: qty 5

## 2017-05-16 MED ORDER — ONDANSETRON HCL 4 MG PO TABS: 8.0000 mg | ORAL_TABLET | Freq: Three times a day (TID) | ORAL | Status: DC | PRN

## 2017-05-16 MED ORDER — VORTIOXETINE HBR 10 MG PO TABS: 10.0000 mg | ORAL_TABLET | Freq: Every day | ORAL | Status: DC

## 2017-05-16 MED ORDER — ETOMIDATE 2 MG/ML IV SOLN
INTRAVENOUS | Status: AC | PRN
  Administered 2017-05-16: 20 mg via INTRAVENOUS

## 2017-05-16 MED ORDER — ALBUTEROL SULFATE (2.5 MG/3ML) 0.083% IN NEBU: 2.5000 mg | INHALATION_SOLUTION | RESPIRATORY_TRACT | Status: DC | PRN

## 2017-05-16 MED ORDER — PROPOFOL 1000 MG/100ML IV EMUL
5.0000 ug/kg/min | Freq: Once | INTRAVENOUS | Status: AC
  Administered 2017-05-16: 5 ug/kg/min via INTRAVENOUS
  Filled 2017-05-16: qty 100

## 2017-05-16 MED ORDER — HALOPERIDOL LACTATE 5 MG/ML IJ SOLN
2.5000 mg | Freq: Once | INTRAMUSCULAR | Status: DC
  Filled 2017-05-16: qty 1

## 2017-05-16 MED ORDER — FENTANYL CITRATE (PF) 100 MCG/2ML IJ SOLN
100.0000 ug | INTRAMUSCULAR | Status: DC | PRN
  Administered 2017-05-16: 100 ug via INTRAVENOUS
  Filled 2017-05-16: qty 2

## 2017-05-16 NOTE — ED Notes (Signed)
Hospitalist paged regarding patients behavior and inability to lay still for CT scan.

## 2017-05-16 NOTE — ED Notes (Signed)
Call report to [facility], RN at [phone] at [phone]

## 2017-05-16 NOTE — ED Notes (Signed)
Bed: WA01 Expected date:  Expected time:  Means of arrival:  Comments: EMS-diarrhea

## 2017-05-16 NOTE — ED Notes (Signed)
[place] technician states to keep patient NPO for procedure at 1900. Patient and friend informed. Patient also refusing to keep cardiac monitor on. Hospitalist notified.

## 2017-05-16 NOTE — ED Notes (Signed)
Ultrasound at bedside

## 2017-05-16 NOTE — Progress Notes (Signed)
  Radiation Oncology         [phone]) [phone] ________________________________  Name: Sydney Blair MRN: [id number]  Date: 05/16/2017  DOB: 05-30-1956  End of Treatment Note  Diagnosis:   Stage IIISquamous Cell Carcinoma of the right upper lobe  Indication for treatment:  Curative       Radiation treatment dates:   03/24/2017-05/12/2017  Site/dose:   Right lung:  2 Gy in 30 fractions  Beams/energy:   IMRT, 6X  Narrative: The patient tolerated radiation treatment relatively well. At the beginning of the patient treatments she was experiencing intermittent dry cough, fatigue, and trouble swallowing. Pt was given Sonafine at this time. At the end of hte patient treatments, she noted a burning sensation with swallowing and reported that she was taking Carafate. Pt reported weight loss, fatigue, and productive cough with clear/pink sputum. On exam, she was noted to have mild bilateral wheezing.   Plan: The patient has completed radiation treatment. The patient will return to radiation oncology clinic for routine followup in one month. I advised them to call or return sooner if they have any questions or concerns related to their recovery or treatment.  -----------------------------------  [name], PhD, MD  This document serves as a record of services personally performed by Gery Pray, MD. It was created on her behalf by Steva Colder, a trained medical scribe. The creation of this record is based on the scribe's personal observations and the provider's statements to them. This document has been checked and approved by the attending provider.

## 2017-05-16 NOTE — ED Notes (Signed)
Pt cannot use restroom at this time, aware urine specimen is needed.  

## 2017-05-16 NOTE — H&P (Addendum)
History and Physical    Sydney Blair BBC:[id number] DOB: 1955/09/27 DOA: 05/16/2017  PCP: [name], MD (Confirm with patient/family/NH records and if not entered, this has to be entered at [facility] point of entry) Patient coming from: home  I have personally briefly reviewed patient's old medical records in [place]  Chief Complaint: diarrhea, elevated liver enzymes  HPI: Sydney Blair is a 61 y.o. 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  History is obtained with the help of a close friend named Clarene Critchley given the patient's mental status. Her symptoms started on 9/23. She felt poorly and had a decreased appetite. She had nausea, diarrhea. Diarrhea described as nonbloody, watery. She's had 4 episodes yesterday and she estimates 4 episodes today. He's had 2 episodes of nonbloody emesis today. Review systems is difficult due to her mental status, but she denies chest pain were notable shortness of breath or dysuria. She denies pain in general. Her friend notes that for the past day or so she's been moaning like she is in pain, but will localize this. She has had a cough over the past few days as well. No definite fevers or chills, but occasionally feels hot.  Denies recent travel, eating out in different places. Denies drug use or alcohol use. Denies Tylenol use.  Her history is notable for cell lung cancer. She's receiving chemotherapy with platinum and pack E Taxol as well as radiation therapy. Her last radiation therapy was around 9/17 (this was rescheduled and they note that she received this, but I don't see it in the chart).  She received cycle 6 of paclitaxel and carboplatin on 9/10.  ED Course: In emergency department she was tachycardic. Chest x-ray notable for questionable pneumonia.  Elevated liver enzymes. She received a bolus, vancomycin, aztreonam, and Levaquin. Elevated troponin suspected due to demand ischemia.  Review of Systems: As per HPI otherwise 10 point review of systems negative.   Past Medical History:  Diagnosis Date  . Anxiety   . ARF (acute renal failure) ([place]) 08/07/2011   ?   Marland Kitchen Arthritis   . Cancer ([place]) 2005   breast-right  . Chronic back pain   . COPD (chronic obstructive pulmonary disease) ([place])   . Depression   . Depression with anxiety   . Dyspnea    on exertion  . Fibromyalgia   . GERD (gastroesophageal reflux disease)   . Hypercholesteremia   . Hypothyroidism   . Incontinence   . Neuropathy   . Peripheral neuropathy   . Sciatic pain   . Sebaceous cyst    lt labial location  . Sleep apnea    uses CPAP  . Stage III squamous cell carcinoma of right lung ([place]) 03/10/2017  . Tobacco abuse     Past Surgical History:  Procedure Laterality Date  . bladder tack    . BREAST BIOPSY Right 12/29/2016   Procedure: RIGHT BREAST BIOPSY;  Surgeon: Aviva Signs, MD;  Location: [facility];  Service: General;  Laterality: Right;  . BREAST LUMPECTOMY     right with node dissection  . COLONOSCOPY  11/11/2010   HWT:[id number] rectal polyp and splenic flexure otherwise normal; pathology showed  tubular adenomas. Next TCS 10/2015.  Marland Kitchen ESOPHAGOGASTRODUODENOSCOPY  11/11/2010   ZPH:XTAVW hiatal hernia, 7 French dilator  . ESOPHAGOGASTRODUODENOSCOPY (EGD) WITH ESOPHAGEAL DILATION N/A 06/21/2013   PVX:[id number]  esophagus  - status post Maloney dilation s/p bx (benign esophageal bx)  . VIDEO BRONCHOSCOPY WITH ENDOBRONCHIAL ULTRASOUND N/A 02/28/2017   Procedure: VIDEO BRONCHOSCOPY WITH ENDOBRONCHIAL ULTRASOUND;  Surgeon: Juanito Doom, MD;  Location: [place];  Service: Thoracic;  Laterality: N/A;     reports that she has been smoking Cigarettes.  She has a 35.00 pack-year smoking history. She has never used smokeless tobacco. She reports that she does not drink  alcohol or use drugs.  Allergies  Allergen Reactions  . Penicillins Anaphylaxis and Other (See Comments)    Has patient had a PCN reaction causing immediate rash, facial/tongue/throat swelling, SOB or lightheadedness with hypotension:Yes Has patient had a PCN reaction causing severe rash involving mucus membranes or skin necrosis:Yes Has patient had a PCN reaction that required hospitalization:No Has patient had a PCN reaction occurring within the last 10 years:No If all of the above answers are "NO", then may proceed with Cephalosporin use.     Family History  Problem Relation Age of Onset  . Cancer Other   . Heart attack Other   . Lung disease Other   . Emphysema Mother   . Autoimmune disease Neg Hx    Denies fh of autoimmune or liver problems  Prior to Admission medications   Medication Sig Start Date End Date Taking? Authorizing Provider  albuterol (PROVENTIL HFA;VENTOLIN HFA) 108 (90 BASE) MCG/ACT inhaler Inhale 2 puffs into the lungs every 6 (six) hours as needed for wheezing or shortness of breath.   Yes [provider]  aspirin 81 MG chewable tablet Chew 81 mg by mouth daily.   Yes [provider]  budesonide-formoterol (SYMBICORT) 160-4.5 MCG/ACT inhaler Inhale 2 puffs into the lungs daily.    Yes [provider]  CALCIUM PO Take 1 tablet by mouth daily.   Yes [provider]  cetirizine (ZYRTEC) 10 MG tablet Take 10 mg by mouth daily.   Yes [provider]  dexlansoprazole (DEXILANT) 60 MG capsule Take 1 capsule (60 mg total) by mouth daily before breakfast. 09/06/16  Yes Mahala Menghini, PA-C  diclofenac sodium (VOLTAREN) 1 % GEL Apply 4 g topically 4 (four) times daily.   Yes [provider]  DULoxetine (CYMBALTA) 60 MG capsule Take 60 mg by mouth daily.   Yes [provider]  fluticasone (FLONASE) 50 MCG/ACT nasal spray Place 2 sprays into both nostrils daily.   Yes [provider]  gabapentin  (NEURONTIN) 800 MG tablet Take 800 mg by mouth 4 (four) times daily. 07/20/16  Yes [provider]  levothyroxine (SYNTHROID, LEVOTHROID) 100 MCG tablet Take 100 mcg by mouth daily before breakfast. Reported on 02/03/2016   Yes [provider]  meclizine (ANTIVERT) 25 MG tablet Take 25 mg by mouth daily.   Yes [provider]  methocarbamol (ROBAXIN) 500 MG tablet Take 500 mg by mouth 4 (four) times daily.    Yes [provider]  nortriptyline (PAMELOR) 25 MG capsule Take 25 mg by mouth at bedtime.   Yes [provider]  olopatadine (PATANOL) 0.1 % ophthalmic solution Place 1 drop into both eyes 2 (two) times daily.    Yes [provider]  ondansetron (ZOFRAN) 8 MG tablet Take 1 tablet (8 mg total) by mouth every 8 (eight) hours as needed for nausea or vomiting. 04/01/17  Yes Curt Bears, MD  pravastatin (PRAVACHOL) 40 MG tablet Take 40 mg by mouth every evening.   Yes [provider]  sucralfate (CARAFATE) 1  GM/10ML suspension Take 1 g by mouth 4 (four) times daily -  with meals and at bedtime.   Yes [provider]  Tapentadol HCl (NUCYNTA) 100 MG TABS Take 100 mg by mouth 2 (two) times daily.   Yes [provider]  tiotropium (SPIRIVA) 18 MCG inhalation capsule Place 1 capsule (18 mcg total) into inhaler and inhale daily. 01/31/15  Yes Clance, Armando Reichert, MD  TRINTELLIX 10 MG TABS Take 10 mg by mouth daily. 07/21/16  Yes [provider]  zolpidem (AMBIEN) 10 MG tablet Take 10 mg by mouth at bedtime as needed for sleep.   Yes [provider]  Potassium Chloride ER 20 MEQ TBCR Take 1 tablet on Monday, Wednesday, and Friday. Patient not taking: Reported on 05/16/2017 03/23/17   Juanito Doom, MD  prochlorperazine (COMPAZINE) 10 MG tablet Take 1 tablet (10 mg total) by mouth every 6 (six) hours as needed for nausea or vomiting. Patient not taking: Reported on 05/16/2017 03/28/17   Maryanna Shape, NP    sucralfate (CARAFATE) 1 g tablet Take 1 tablet (1 g total) by mouth 4 (four) times daily -  with meals and at bedtime. Crush and dissolve tablet in 15 ml of water. Patient not taking: Reported on 05/16/2017 05/03/17   Gery Pray, MD  Wound Dressings [facility]) Apply 1 application topically 3 (three) times daily.    [provider]    Physical Exam: Vitals:   05/16/17 1617 05/16/17 1630 05/16/17 1720 05/16/17 1721  BP: (!) 139/98 (!) 164/88 (!) 164/88   Pulse: (!) 109 (!) 105 (!) 107   Resp: 18 18 (!) 24   Temp:    98.4 F (36.9 C)  TempSrc:    Oral  SpO2: 95% 92% 92%   Weight:      Height:        Constitutional: NAD, calm, comfortable Vitals:   05/16/17 1617 05/16/17 1630 05/16/17 1720 05/16/17 1721  BP: (!) 139/98 (!) 164/88 (!) 164/88   Pulse: (!) 109 (!) 105 (!) 107   Resp: 18 18 (!) 24   Temp:    98.4 F (36.9 C)  TempSrc:    Oral  SpO2: 95% 92% 92%   Weight:      Height:       Exam difficult.  Patient agitated.   Chronically ill appearing, appears uncomfortable Eyes: PERRL, lids and conjunctivae normal ENMT: Mucous membranes are moist. Posterior pharynx clear of any exudate or lesions.Normal dentition.  Neck: normal, supple, no masses, no thyromegaly Respiratory: clear to auscultation bilaterally, no wheezing, no crackles. Normal respiratory effort. No accessory muscle use.  Cardiovascular: Regular rate and rhythm, no murmurs / rubs / gallops. No extremity edema. 2+ pedal pulses. No carotid bruits.  Abdomen: no tenderness, no masses palpated. No hepatosplenomegaly. Bowel sounds positive.  Musculoskeletal: no clubbing / cyanosis. No joint deformity upper and lower extremities. Good ROM, no contractures. Normal muscle tone.  Skin: no rashes, lesions, ulcers. No induration Neurologic: CN 2-12 grossly intact. Strength 5/5 in all 4.  Psychiatric: Agitated, answers questions intermittently, A&Ox2 (doesn't say month)  Labs on Admission: I have personally  reviewed following labs and imaging studies  CBC:  Recent Labs Lab 05/16/17 1429  WBC 9.3  NEUTROABS 8.5*  HGB 9.0*  HCT 27.9*  MCV 88.3  PLT 237*   Basic Metabolic Panel:  Recent Labs Lab 05/16/17 1429  NA 140  K 3.1*  CL 95*  CO2 31  GLUCOSE 81  BUN 14  CREATININE 0.76  CALCIUM 7.6*   GFR: Estimated Creatinine Clearance: 66.9 mL/min (by C-G formula based on SCr of 0.76 mg/dL). Liver Function Tests:  Recent Labs Lab 05/16/17 1429  AST 854*  ALT 594*  ALKPHOS 106  BILITOT 1.3*  PROT 6.8  ALBUMIN 3.1*   No results for input(s): LIPASE, AMYLASE in the last 168 hours. No results for input(s): AMMONIA in the last 168 hours. Coagulation Profile:  Recent Labs Lab 05/16/17 1653  INR 1.19   Cardiac Enzymes: No results for input(s): CKTOTAL, CKMB, CKMBINDEX, TROPONINI in the last 168 hours. BNP (last 3 results) No results for input(s): PROBNP in the last 8760 hours. HbA1C: No results for input(s): HGBA1C in the last 72 hours. CBG: No results for input(s): GLUCAP in the last 168 hours. Lipid Profile: No results for input(s): CHOL, HDL, LDLCALC, TRIG, CHOLHDL, LDLDIRECT in the last 72 hours. Thyroid Function Tests: No results for input(s): TSH, T4TOTAL, FREET4, T3FREE, THYROIDAB in the last 72 hours. Anemia Panel: No results for input(s): VITAMINB12, FOLATE, FERRITIN, TIBC, IRON, RETICCTPCT in the last 72 hours. Urine analysis:    Component Value Date/Time   COLORURINE YELLOW 05/16/2017 1410   APPEARANCEUR CLEAR 05/16/2017 1410   LABSPEC 1.004 (L) 05/16/2017 1410   PHURINE 6.0 05/16/2017 1410   GLUCOSEU NEGATIVE 05/16/2017 1410   HGBUR NEGATIVE 05/16/2017 1410   HGBUR negative 02/26/2008 1027   BILIRUBINUR NEGATIVE 05/16/2017 1410   KETONESUR 5 (A) 05/16/2017 1410   PROTEINUR NEGATIVE 05/16/2017 1410   UROBILINOGEN 0.2 06/20/2012 1231   NITRITE NEGATIVE 05/16/2017 1410   LEUKOCYTESUR NEGATIVE 05/16/2017 1410    Radiological Exams on  Admission: Dg Chest Port 1 View  Result Date: 05/16/2017 CLINICAL DATA:  61 year old female with weakness, tachycardia and diarrhea. Patient with lung cancer on chemoradiation therapy. EXAM: PORTABLE CHEST 1 VIEW COMPARISON:  02/11/2017 head CT. 12/08/2016 and prior chest radiographs FINDINGS: The cardiomediastinal silhouette is unchanged with right hilar fullness. Increased opacity within the peripheral right upper lobe noted. The left lung is clear. No pleural effusion or pneumothorax identified. The right upper lobe mass identified on 12/08/2016 is much less conspicuous. IMPRESSION: Increased opacity within the peripheral right upper lobe which may be related to treatment changes/atelectasis, but pneumonia/ pneumonitis or less likely metastatic disease is not excluded. Electronically Signed   By: Margarette Canada M.D.   On: 05/16/2017 15:23    EKG: Independently reviewed. Appears similar to priors.  Poor quality EKG.  Sinus tachycardia.  Prolonged QT.   Assessment/Plan Principal Problem:   Hepatitis Active Problems:   TOBACCO ABUSE   GERD   Hypokalemia   Nausea vomiting and diarrhea   SIRS (systemic inflammatory response syndrome) (HCC)   COPD (chronic obstructive pulmonary disease) (HCC)   Abnormal CXR   Anemia   Thrombocytopenia (HCC)   Depression   Anxiety   Chronic pain   SIRS: tachycardia and elevated RR.  Normal WBC.  Received vanc/aztreonam/levaquin in ED.  CXR findings c/f ?pneumonia.  Diarrhea as below.  Suspect this is due to dehydration with diarrhea and here elevated liver enzymes below, but will plan to cover for pneumonia as well though suspect this is less likely.  Received 1 L in ED, will add on 1.5 L _0  follow cultures _1  UA pending Abx as below (will need to be reordered received dose of levaquin for pneumonia, holding off on reordering until repeat EKG - has penicillin allergy and not able to elaborate based on mental status) Initial lactate negative  Hepatitis:   Elevated liver enzymes.  Hepatocellular pattern with near normal bili and alk phos.  No drug use.  She's on pravastatin.  No drinking.  Denies recent APAP.  tapentadol rarely associated with elevated AST/ALT.  Nothing else on med list stands out as obviously hepatotoxic to me, other than her chemotherapy which could cause elevated liver enzymes.  Presentation is otherwise concerning for a viral etiology with diarrhea below.  RUQ [place] and doppler APAP, salicylate Utox HIV Acute hepatitis panel/HIV/CMV ANA, ASMA TSH Consider GI c/s if labs not improving Daily CMP, PT/INR, CBC  Diarrhea: Within the past day.  Combine with above, makes me most concerned for infectious viral hepatitis.  Will add on cdiff/gi path panel.  Elevated troponin:  No c/o CP.  EKG c/w previous.  Suspect this is demand from dehydration and her processes above.  Will continue to trend troponins.   Imaging findings concerning for pneumonia:  Low suspicion for this, she was satting well on room air for me.  I think her primary problem is above.  She received vanc/levaquin/aztreonam in ED.  She has had a cough, so will continue antibiotics.  Initially planned on ceftriaxone, but pt with penicillin allergy and unable to elaborate.  Covered through tomorrow with levaquin for pneumonia, will f/u repeat EKG prior to continuing this.   Legionella antigen/Strep/Sputum cx _0  reorder abx  Delirium: likely 2/2 acute illness.  Exam is nonfocal.  Continue to monitor.  She's needed ativan for her agitation.  _1  VBG, head CT _2  safety sitter  Anemia/Thrombocytopenia: H/H downtrending from previously.  Thrombocytopenia is new as well.  No blood in stool. Anemia labs Daily CBC  Stage IIIa NSCLC, Squamous Cell Carcinoma: dx July 2018.  Pt undergoing chemoradiation.  Cycle 6 chemo pacitaxel/carbopaltin was 9/10.  Last radiation was around 9/17 (looks like it was rescheduled, but around that date). _3  onc c/s placed by ED, appreciate  recs  Prolonged QT: f/u repeat EKG, avoid QT prolonging drugs _4  magnesium  Hypokalemia: replete  COPD: continue home meds  Chronic pain  Depression  Anxiety: holding home pain meds given delirium.  Continue cymbalta/nortryptyline/trintellix  Hypothyroidism: synthroid  GERD: carafate/dexilant  Tobacco abuse: nicotine patch   DVT prophylaxis: lovenox  Code Status: full  Family Communication: Theresa Disposition Plan: pending improvement, likely home vs SNF Consults called: none  Admission status: tele    Fayrene Helper MD [facility] [phone]   If 7PM-7AM, please contact night-coverage [email or web address] Password [facility]  05/16/2017, 7:45 PM

## 2017-05-16 NOTE — ED Provider Notes (Signed)
[facility] DEPT Provider Note   CSN: [id number] Arrival date & time: 05/16/17  1258     History   Chief Complaint Chief Complaint  Patient presents with  . Diarrhea  . Weakness    HPI Sydney Blair is a 61 y.o. female with a history of lung cancer who with in the past week completed radiation and chemo, breast cancer, fibro,  COPD, sleep apnea who presents for two days of gradually feeling worse and having multiple bouts of diarrhea, that has been non bloody. She denies any fevers, or chills.  No N/V.  She denies increased SOB, CP, abdominal pain.  No falls.  Her head does not hurt.  She feels weak overall. She has just completed her radiation treatment 4 days ago and her last chemo infusion was 05/02/17.  She has not gotten any new medications recently.  She reports she has not had these symptoms before from her treatment.  No known sick contacts.   Her oncologist is Dr. Julien Nordmann.    HPI  Past Medical History:  Diagnosis Date  . Anxiety   . ARF (acute renal failure) ([place]) 08/07/2011   ?   Marland Kitchen Arthritis   . Cancer ([place]) 2005   breast-right  . Chronic back pain   . COPD (chronic obstructive pulmonary disease) ([place])   . Depression   . Depression with anxiety   . Dyspnea    on exertion  . Fibromyalgia   . GERD (gastroesophageal reflux disease)   . Hypercholesteremia   . Hypothyroidism   . Incontinence   . Neuropathy   . Peripheral neuropathy   . Sciatic pain   . Sebaceous cyst    lt labial location  . Sleep apnea    uses CPAP  . Stage III squamous cell carcinoma of right lung ([place]) 03/10/2017  . Tobacco abuse     Patient Active Problem List   Diagnosis Date Noted  . Hepatitis 05/16/2017  . Nausea vomiting and diarrhea 05/16/2017  . SIRS (systemic inflammatory response syndrome) ([place]) 05/16/2017  . COPD (chronic obstructive pulmonary disease) ([place]) 05/16/2017  . Abnormal CXR 05/16/2017  . Anemia 05/16/2017  . Thrombocytopenia ([place]) 05/16/2017  .  Depression 05/16/2017  . Anxiety 05/16/2017  . Chronic pain 05/16/2017  . Acute on chronic respiratory failure ([place]) 05/16/2017  . Stage III squamous cell carcinoma of right lung ([place]) 03/10/2017  . Goals of care, counseling/discussion 03/10/2017  . Encounter for antineoplastic chemotherapy 03/10/2017  . Lung mass 02/01/2017  . Breast mass, right   . Hx of adenomatous colonic polyps 12/28/2016  . Fracture of fifth metatarsal bone of right foot with delayed healing 02/03/2016  . IBS (irritable bowel syndrome) 07/30/2015  . Diarrhea 10/29/2014  . Hoarseness 10/29/2014  . Chronic hoarseness 01/16/2014  . Other dysphagia 01/16/2014  . Dysphagia, unspecified(787.20) 06/04/2013  . Unspecified constipation 06/04/2013  . Unsteady gait 01/25/2013  . COPD (chronic obstructive pulmonary disease) with emphysema ([place]) 12/26/2012  . DOE (dyspnea on exertion) 12/01/2012  . 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 Dysphagia 11/09/2012  . COPD exacerbation ([place]) 08/07/2011  . Hypokalemia 08/07/2011  . Myositis 08/07/2011  . Dehydration 08/07/2011  . ARF (acute renal failure) ([place]) 08/07/2011  . Tubular adenoma 07/20/2011  . INSOMNIA 02/26/2008  . CERUMEN IMPACTION, RIGHT 01/23/2008  . 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 NECK PAIN 11/22/2007  . HAMMER TOE 11/22/2007  . ANXIETY 01/30/2007  . TOBACCO ABUSE 01/30/2007  . DEPRESSION 01/30/2007  . GERD  01/30/2007  . PROLAPSE, VAGINAL WALL, CYSTOCELE, MIDLINE 01/10/2007  . INCONTINENCE 01/10/2007  . SEBACEOUS CYST 08/17/2006  . Breast cancer, right breast ([place]) 12/01/2003    Past Surgical History:  Procedure Laterality Date  . bladder tack    . BREAST BIOPSY Right 12/29/2016   Procedure: RIGHT BREAST BIOPSY;  Surgeon: Aviva Signs, MD;  Location: [facility];  Service: General;  Laterality: Right;  . BREAST LUMPECTOMY     right with node dissection  . COLONOSCOPY  11/11/2010   EPP:IRJJOACZYS  rectal polyp and splenic flexure otherwise normal; pathology showed  tubular adenomas. Next TCS 10/2015.  Marland Kitchen ESOPHAGOGASTRODUODENOSCOPY  11/11/2010   AYT:KZSWF hiatal hernia, 26 French dilator  . ESOPHAGOGASTRODUODENOSCOPY (EGD) WITH ESOPHAGEAL DILATION N/A 06/21/2013   UXN:ATFTDD esophagus  - status post Maloney dilation s/p bx (benign esophageal bx)  . VIDEO BRONCHOSCOPY WITH ENDOBRONCHIAL ULTRASOUND N/A 02/28/2017   Procedure: VIDEO BRONCHOSCOPY WITH ENDOBRONCHIAL ULTRASOUND;  Surgeon: Juanito Doom, MD;  Location: [facility];  Service: Thoracic;  Laterality: N/A;    OB History    No data available       Home Medications    Prior to Admission medications   Medication Sig Start Date End Date Taking? Authorizing Provider  albuterol (PROVENTIL HFA;VENTOLIN HFA) 108 (90 BASE) MCG/ACT inhaler Inhale 2 puffs into the lungs every 6 (six) hours as needed for wheezing or shortness of breath.   Yes [provider]  aspirin 81 MG chewable tablet Chew 81 mg by mouth daily.   Yes [provider]  budesonide-formoterol (SYMBICORT) 160-4.5 MCG/ACT inhaler Inhale 2 puffs into the lungs daily.    Yes [provider]  CALCIUM PO Take 1 tablet by mouth daily.   Yes [provider]  cetirizine (ZYRTEC) 10 MG tablet Take 10 mg by mouth daily.   Yes [provider]  dexlansoprazole (DEXILANT) 60 MG capsule Take 1 capsule (60 mg total) by mouth daily before breakfast. 09/06/16  Yes Mahala Menghini, PA-C  diclofenac sodium (VOLTAREN) 1 % GEL Apply 4 g topically 4 (four) times daily.   Yes [provider]  DULoxetine (CYMBALTA) 60 MG capsule Take 60 mg by mouth daily.   Yes [provider]  fluticasone (FLONASE) 50 MCG/ACT nasal spray Place 2 sprays into both nostrils daily.   Yes [provider]  gabapentin (NEURONTIN) 800 MG tablet Take 800 mg by mouth 4 (four) times daily. 07/20/16  Yes [provider]  levothyroxine (SYNTHROID,  LEVOTHROID) 100 MCG tablet Take 100 mcg by mouth daily before breakfast. Reported on 02/03/2016   Yes [provider]  meclizine (ANTIVERT) 25 MG tablet Take 25 mg by mouth daily.   Yes [provider]  methocarbamol (ROBAXIN) 500 MG tablet Take 500 mg by mouth 4 (four) times daily.    Yes [provider]  nortriptyline (PAMELOR) 25 MG capsule Take 25 mg by mouth at bedtime.   Yes [provider]  olopatadine (PATANOL) 0.1 % ophthalmic solution Place 1 drop into both eyes 2 (two) times daily.    Yes [provider]  ondansetron (ZOFRAN) 8 MG tablet Take 1 tablet (8 mg total) by mouth every 8 (eight) hours as needed for nausea or vomiting. 04/01/17  Yes Curt Bears, MD  pravastatin (PRAVACHOL) 40 MG tablet Take 40 mg by mouth every evening.   Yes [provider]  sucralfate (CARAFATE) 1 GM/10ML suspension Take 1 g by mouth 4 (four) times daily -  with meals and at bedtime.  Yes [provider]  Tapentadol HCl (NUCYNTA) 100 MG TABS Take 100 mg by mouth 2 (two) times daily.   Yes [provider]  tiotropium (SPIRIVA) 18 MCG inhalation capsule Place 1 capsule (18 mcg total) into inhaler and inhale daily. 01/31/15  Yes Clance, Armando Reichert, MD  TRINTELLIX 10 MG TABS Take 10 mg by mouth daily. 07/21/16  Yes [provider]  zolpidem (AMBIEN) 10 MG tablet Take 10 mg by mouth at bedtime as needed for sleep.   Yes [provider]  Potassium Chloride ER 20 MEQ TBCR Take 1 tablet on Monday, Wednesday, and Friday. Patient not taking: Reported on 05/16/2017 03/23/17   Juanito Doom, MD  prochlorperazine (COMPAZINE) 10 MG tablet Take 1 tablet (10 mg total) by mouth every 6 (six) hours as needed for nausea or vomiting. Patient not taking: Reported on 05/16/2017 03/28/17   Maryanna Shape, NP  sucralfate (CARAFATE) 1 g tablet Take 1 tablet (1 g total) by mouth 4 (four) times daily -  with meals and at bedtime. Crush and dissolve  tablet in 15 ml of water. Patient not taking: Reported on 05/16/2017 05/03/17   Gery Pray, MD  Wound Dressings [facility]) Apply 1 application topically 3 (three) times daily.    [provider]    Family History Family History  Problem Relation Age of Onset  . Cancer Other   . Heart attack Other   . Lung disease Other   . Emphysema Mother   . Autoimmune disease Neg Hx     Social History Social History  Substance Use Topics  . Smoking status: Current Every Day Smoker    Packs/day: 1.00    Years: 35.00    Types: Cigarettes  . Smokeless tobacco: Never Used     Comment: has cut back to one pack daily  . Alcohol use No     Allergies   Penicillins   Review of Systems Review of Systems  Constitutional: Positive for chills and fatigue. Negative for fever.  HENT: Negative for ear pain and sore throat.   Eyes: Negative for pain and visual disturbance.  Respiratory: Negative for cough and shortness of breath.   Cardiovascular: Negative for chest pain and palpitations.  Gastrointestinal: Positive for diarrhea. Negative for abdominal distention, abdominal pain, constipation, nausea and vomiting.  Genitourinary: Negative for dysuria and hematuria.  Musculoskeletal: Negative for arthralgias and back pain.  Skin: Negative for color change and rash.  Neurological: Negative for seizures and syncope.  All other systems reviewed and are negative.    Physical Exam Updated Vital Signs BP (!) 189/135   Pulse (!) 117   Temp 99 F (37.2 C) (Oral)   Resp 20   Ht 5' (1.524 m)   Wt 71.7 kg (158 lb)   SpO2 100%   BMI 30.86 kg/m   Physical Exam  Constitutional: She is oriented to person, place, and time. She appears well-developed and well-nourished. She appears ill.  HENT:  Head: Normocephalic and atraumatic.  Mouth/Throat: Oropharynx is clear and moist. No oropharyngeal exudate.  Eyes: Pupils are equal, round, and reactive to light. Conjunctivae and EOM are normal.    Neck: Normal range of motion. Neck supple. No JVD present.  Cardiovascular: Regular rhythm, S1 normal, S2 normal, normal heart sounds and intact distal pulses.  Tachycardia present.  Exam reveals no friction rub.   No murmur heard. Pulmonary/Chest: Effort normal and breath sounds normal. No stridor. No respiratory distress. She has no wheezes. She has no  rales.  Abdominal: Soft. Bowel sounds are normal. She exhibits no distension. There is no tenderness. There is no rigidity, no rebound, no guarding, no CVA tenderness and negative Murphy's sign.  Abdomen is obese  Musculoskeletal: She exhibits no edema, tenderness or deformity.  Neurological: She is alert and oriented to person, place, and time. She has normal strength. She displays no tremor. She exhibits normal muscle tone. Coordination normal. GCS eye subscore is 4. GCS verbal subscore is 5. GCS motor subscore is 6.  Skin: Skin is warm and dry.  Psychiatric: Her behavior is normal. Her mood appears anxious.  Nursing note and vitals reviewed.    ED Treatments / Results  Labs (all labs ordered are listed, but only abnormal results are displayed) Labs Reviewed  URINALYSIS, ROUTINE W REFLEX MICROSCOPIC - Abnormal; Notable for the following:       Result Value   Specific Gravity, Urine 1.004 (*)    Ketones, ur 5 (*)    All other components within normal limits  COMPREHENSIVE METABOLIC PANEL - Abnormal; Notable for the following:    Potassium 3.1 (*)    Chloride 95 (*)    Calcium 7.6 (*)    Albumin 3.1 (*)    AST 854 (*)    ALT 594 (*)    Total Bilirubin 1.3 (*)    All other components within normal limits  CBC WITH DIFFERENTIAL/PLATELET - Abnormal; Notable for the following:    RBC 3.16 (*)    Hemoglobin 9.0 (*)    HCT 27.9 (*)    RDW 24.0 (*)    Platelets 121 (*)    Neutro Abs 8.5 (*)    Lymphs Abs 0.3 (*)    All other components within normal limits  TROPONIN I - Abnormal; Notable for the following:    Troponin I 0.36 (*)     All other components within normal limits  RETICULOCYTES - Abnormal; Notable for the following:    Retic Ct Pct 4.8 (*)    RBC. 3.17 (*)    All other components within normal limits  LACTATE DEHYDROGENASE - Abnormal; Notable for the following:    LDH 1,285 (*)    All other components within normal limits  BLOOD GAS, VENOUS - Abnormal; Notable for the following:    Bicarbonate 29.9 (*)    Acid-Base Excess 4.4 (*)    All other components within normal limits  BLOOD GAS, ARTERIAL - Abnormal; Notable for the following:    pH, Arterial 7.201 (*)    pCO2 arterial 76.4 (*)    pO2, Arterial 324 (*)    Bicarbonate 28.7 (*)    All other components within normal limits  I-STAT TROPONIN, ED - Abnormal; Notable for the following:    Troponin i, poc 0.23 (*)    All other components within normal limits  CULTURE, BLOOD (SINGLE)  CULTURE, RESPIRATORY (NON-EXPECTORATED)  GASTROINTESTINAL PANEL BY PCR, STOOL (REPLACES STOOL CULTURE)  C DIFFICILE QUICK SCREEN W PCR REFLEX  CULTURE, EXPECTORATED SPUTUM-ASSESSMENT  RESPIRATORY PANEL BY PCR  PROTIME-INR  APTT  RAPID URINE DRUG SCREEN, HOSP PERFORMED  TSH  LIPASE, BLOOD  AMMONIA  HEPATITIS PANEL, ACUTE  ACETAMINOPHEN LEVEL  SALICYLATE LEVEL  TROPONIN I  TROPONIN I  HIV ANTIBODY (ROUTINE TESTING)  IRON AND TIBC  FERRITIN  CMV IGM  CMV ANTIBODY, IGG (EIA)  MAGNESIUM  HAPTOGLOBIN  BASIC METABOLIC PANEL  CBC  MAGNESIUM  PHOSPHORUS  PROCALCITONIN  PROCALCITONIN  ETHANOL  STREP PNEUMONIAE URINARY ANTIGEN  LEGIONELLA PNEUMOPHILA  SEROGP 1 UR AG  PROTIME-INR  ANA W/REFLEX IF POSITIVE  ANTI-SMOOTH MUSCLE ANTIBODY, IGG  INFLUENZA PANEL BY PCR (TYPE A & B)  I-STAT CG4 LACTIC ACID, ED    EKG  EKG Interpretation  Date/Time:  Monday May 16 2017 21:06:18 EDT Ventricular Rate:  116 PR Interval:    QRS Duration: 86 QT Interval:  357 QTC Calculation: 496 R Axis:   95 Text Interpretation:  Sinus tachycardia Paired ventricular  premature complexes Aberrant conduction of SV complex(es) Borderline right axis deviation Nonspecific T abnormalities, lateral leads Borderline prolonged QT interval Baseline wander in lead(s) V1 No significant change since last tracing Confirmed by Isla Pence [phone]) on 05/16/2017 9:09:34 PM       Radiology Dg Chest Portable 1 View  Result Date: 05/16/2017 CLINICAL DATA:  Intubation and gastric tube placement. Patient with lung cancer on chemoradiation therapy. EXAM: PORTABLE CHEST 1 VIEW COMPARISON:  CXR from 1507 hours FINDINGS: The heart size and mediastinal contours are within normal limits. Right upper lobe opacities are again visualized with differential considerations that may also include post treatment change, pneumonia and/or atelectasis among some possibilities though not exclusive. Mild fullness of the right hilum as before. The tip of an endotracheal tube is 3.5 cm above the carina in satisfactory position. A gastric tube is seen extending below the left hemidiaphragm in the expected location of the stomach with the tip is excluded on current exam. Heart is borderline enlarged. Minimal aortic atherosclerosis at the arch. IMPRESSION: 1. Satisfactory support line and tube positions to the extent visualized. 2. Patchy airspace opacities in the right upper lobe persists without change. Electronically Signed   By: Ashley Royalty M.D.   On: 05/16/2017 21:51   Dg Chest Port 1 View  Result Date: 05/16/2017 CLINICAL DATA:  61 year old female with weakness, tachycardia and diarrhea. Patient with lung cancer on chemoradiation therapy. EXAM: PORTABLE CHEST 1 VIEW COMPARISON:  02/11/2017 head CT. 12/08/2016 and prior chest radiographs FINDINGS: The cardiomediastinal silhouette is unchanged with right hilar fullness. Increased opacity within the peripheral right upper lobe noted. The left lung is clear. No pleural effusion or pneumothorax identified. The right upper lobe mass identified on 12/08/2016 is  much less conspicuous. IMPRESSION: Increased opacity within the peripheral right upper lobe which may be related to treatment changes/atelectasis, but pneumonia/ pneumonitis or less likely metastatic disease is not excluded. Electronically Signed   By: Margarette Canada M.D.   On: 05/16/2017 15:23    Procedures Procedures  CRITICAL CARE Performed by: Wyn Quaker Total critical care time: 37 minutes Critical care time was exclusive of separately billable procedures and treating other patients. Critical care was necessary to treat or prevent imminent or life-threatening deterioration. Critical care was time spent personally by me on the following activities: development of treatment plan with patient and/or surrogate as well as nursing, discussions with consultants, evaluation of patient's response to treatment, examination of patient, obtaining history from patient or surrogate, ordering and performing treatments and interventions, ordering and review of laboratory studies, ordering and review of radiographic studies, pulse oximetry and re-evaluation of patient's condition.   Medications Ordered in ED Medications  ondansetron (ZOFRAN) tablet 8 mg (not administered)  olopatadine (PATANOL) 0.1 % ophthalmic solution 1 drop (not administered)  levothyroxine (SYNTHROID, LEVOTHROID) tablet 100 mcg (not administered)  0.9 %  sodium chloride infusion (not administered)  potassium chloride SA (K-DUR,KLOR-CON) CR tablet 40 mEq (0 mEq Oral Hold 05/16/17 1943)  aspirin tablet 325 mg (325 mg Oral Not Given  05/16/17 2130)  heparin bolus via infusion 3,500 Units (0 Units Intravenous Hold 05/16/17 2145)    Followed by  heparin ADULT infusion 100 units/mL (25000 units/277mL sodium chloride 0.45%) (0 Units/hr Intravenous Hold 05/16/17 2145)  pantoprazole (PROTONIX) injection 40 mg (not administered)  fentaNYL (SUBLIMAZE) injection 100 mcg (not administered)  fentaNYL (SUBLIMAZE) injection 100 mcg (not  administered)  albuterol (PROVENTIL) (2.5 MG/3ML) 0.083% nebulizer solution 2.5 mg (not administered)  budesonide (PULMICORT) nebulizer solution 0.5 mg (not administered)  ipratropium-albuterol (DUONEB) 0.5-2.5 (3) MG/3ML nebulizer solution 3 mL (not administered)  fentaNYL (SUBLIMAZE) injection 100 mcg (not administered)  sodium chloride 0.9 % bolus 1,000 mL (0 mLs Intravenous Stopped 05/16/17 1638)  levofloxacin (LEVAQUIN) IVPB 750 mg (0 mg Intravenous Stopped 05/16/17 1829)  aztreonam (AZACTAM) 2 g in dextrose 5 % 50 mL IVPB (0 g Intravenous Stopped 05/16/17 1749)  vancomycin (VANCOCIN) IVPB 1000 mg/200 mL premix (0 mg Intravenous Stopped 05/16/17 1839)  LORazepam (ATIVAN) injection 0.5 mg (0.5 mg Intravenous Given 05/16/17 1701)  sodium chloride 0.9 % bolus 1,000 mL (0 mLs Intravenous Stopped 05/16/17 2018)  iopamidol (ISOVUE-300) 61 % injection 100 mL (100 mLs Intravenous Contrast Given 05/16/17 2218)  sodium chloride 0.9 % bolus 500 mL (0 mLs Intravenous Stopped 05/16/17 2041)  LORazepam (ATIVAN) injection 1 mg (1 mg Intravenous Given 05/16/17 1905)  calcium gluconate 1 g in sodium chloride 0.9 % 100 mL IVPB (0 g Intravenous Stopped 05/16/17 2148)  LORazepam (ATIVAN) injection 2 mg (2 mg Intravenous Given 05/16/17 1955)  0.9 %  sodium chloride infusion ( Intravenous Stopped 05/16/17 2203)  etomidate (AMIDATE) injection (20 mg Intravenous Given 05/16/17 2123)  succinylcholine (ANECTINE) injection (100 mg Intravenous Given 05/16/17 2124)  propofol (DIPRIVAN) 1000 MG/100ML infusion (45 mcg/kg/min  71.7 kg Intravenous Rate/Dose Change 05/16/17 2206)     Initial Impression / Assessment and Plan / ED Course  I have reviewed the triage vital signs and the nursing notes.  Pertinent labs & imaging results that were available during my care of the patient were reviewed by me and considered in my medical decision making (see chart for details).  Clinical Course as of May 16 2246  Mon May 16, 2017  1606  Informed patient of her results.  She became upset, started crying.  She asked for something for her anxiety, She has been restless while here and anxious.  Will treat with ativan.   [EH]  1612 Spoke with Dr. Arbutus Ped who feels patient is dehydrated, requests hospitalist admit, work her up for elevated liver enzymes.   [EH]  2132 Patient is intubated.  Will page Critical care.  Was notified by RN that patient was gradually worsening, no longer alert and oriented as she was previously.  RN reports that she has contacted the hospitalist but she is worried about the patient.  I evaluated the patient, and immediately asked a tech to get Dr. Particia Nearing as I feel like the patient is unable to protect her airway.  She is no longer verbal, not obeying commands.  Patient was moved to ResA and appropriate staff was paged for intubation and arrived.  Dr. Particia Nearing was present.  Please see procedure note.   [EH]  2206 Spoke with Dr. Jamison Neighbor from critical care who reports that someone from [facility]  will come see patient.   [EH]    Clinical Course User Index [EH] Cristina Gong, PA-C   JASLIN NOVITSKI presents for evaluation of two days of worsening not feeling well and diarrhea.  She  is a Cancer patient receiving radiation and chemo for right sided lung cancer.  She Last had chemo 14 days ago, last radiation 4 days ago.  She has not had any new medications recently, has not had these effects from her treatments in the past.  Labs were obtained, shows patient has a new transaminitis with AST of 854 and ALT of 594, with out alk phos elevation.  She does not have any RUQ pain or tenderness on exam.  Abdominal imaging was considered but patient's abdomen is soft, non tender, no focal findings, UA with out acute findings.  Patient is consistently tachycardic, most likely secondary to dehydration, however can not exclude other processes.  Patient has a positive troponin which I suspect is from demand ischemia based on lack  of symptoms cardiac or pulmonary symptoms.    CXR was obtained which Showed right upper lobe either pneumonia, or changes from radiation.    Sepsis was considered, however patient is mildly hypertensive, afebrile, with a normal lactic acid, no leukocytosis.  PE was considered, as patient has active cancer, however I do not feel that this would cause her diarrhea which occurred before she started feeling weak.  Patient is mildly hypokalemic with a potassium of 3.1, this was repleted with by mouth potassium.  Bilirubin is very mildly elevated at 1.3.  Patient is mildly anemic with hemoglobin of 9, thrombocytopenic with platelets of 121.    While in the dept patient was constantly fidgeting, restless which was interfering with medical monitoring.  She was given 0.'5mg'$  of ativan which allowed her to be calm but not sedated.  After discussion with Dr. Leonette Monarch  I spoke with Dr. Julien Nordmann, the patient's oncologist who feels that the patient is most likely dehydrated, requests that the hospitalist admit and work her up for elevated liver enzymes. I spoke with hospitalist who agreed to admit patient to telemetry.    Later I was made aware of patient having a worsening condition by her RN.  Patient remained in the ED for additional studies and pending bed placement despite being admitted.   I evaluated the patient and reviewed interim notes from RN, and orders from hospitalist.    I evaluated the patient, she has a reduced GCS [id number], pursed lip breathing, and she was unable to protect her own airway.  Dr. Gilford Raid was immediately involved in the patient's care and the decision was made to intubate.  Please see note from Dr. Gilford Raid for details.  Hospitalist was made aware of patient's worsened condition.  Chart review shows she was given an additional total of '2mg'$  of ativan as she was unable to stay still for scans that the hospitalist ordered to be completed in the ED.  After intubation and patient having been on  ventilator for about 10 minutes her ABG showed pH of 7.201, pCO2 of 76.4, pO2 of 324, Bicarb of 28.7.  I spoke with Dr. Ashok Cordia from critical care who agreed to admit patient to ICU.     Final Clinical Impressions(s) / ED Diagnoses   Final diagnoses:  Transaminitis  Dehydration  Acute on chronic respiratory failure with hypercapnia (HCC)  Altered mental status, unspecified altered mental status type  Stage III squamous cell carcinoma of right lung [facility])    New Prescriptions New Prescriptions   No medications on file     Ollen Gross 05/16/17 2319    Isla Pence, MD 05/16/17 [phone]

## 2017-05-16 NOTE — Progress Notes (Signed)
A consult was received from an ED provider for Vancomycin, Levofloxacin, and Aztreonam per pharmacy dosing.  The patient's profile has been reviewed for ht/wt/allergies/indication/available labs.    One time orders have already been placed by the provider for Vancomycin 1g IV, Levofloxacin 750mg  IV, and Aztreonam 2g IV.  Further antibiotics/pharmacy consults should be ordered by admitting physician if indicated.                       Thank you,   Lindell Spar, PharmD, BCPS Pager: [phone] 05/16/2017 4:20 PM

## 2017-05-16 NOTE — ED Notes (Signed)
Patient presents with dried feces covering abdomen and bilateral legs. Patient given bed bath and linen change.

## 2017-05-16 NOTE — Progress Notes (Signed)
ANTICOAGULATION CONSULT NOTE - Initial Consult  Pharmacy Consult for IV heparin Indication: ACS  Allergies  Allergen Reactions  . Penicillins Anaphylaxis and Other (See Comments)    Has patient had a PCN reaction causing immediate rash, facial/tongue/throat swelling, SOB or lightheadedness with hypotension:Yes Has patient had a PCN reaction causing severe rash involving mucus membranes or skin necrosis:Yes Has patient had a PCN reaction that required hospitalization:No Has patient had a PCN reaction occurring within the last 10 years:No If all of the above answers are "NO", then may proceed with Cephalosporin use.     Patient Measurements: Height: 5' (152.4 cm) Weight: 158 lb (71.7 kg) IBW/kg (Calculated) : 45.5 Heparin Dosing Weight: 61.3 kg  Vital Signs: Temp: 99 F (37.2 C) (09/24 2156) Temp Source: Oral (09/24 2156) BP: 189/135 (09/24 2135) Pulse Rate: 117 (09/24 2156)  Labs:  Recent Labs  05/16/17 1429 05/16/17 1653 05/16/17 1733  HGB 9.0*  --   --   HCT 27.9*  --   --   PLT 121*  --   --   APTT  --  28  --   LABPROT  --  15.0  --   INR  --  1.19  --   CREATININE 0.76  --   --   TROPONINI  --   --  0.36*    Estimated Creatinine Clearance: 66.9 mL/min (by C-G formula based on SCr of 0.76 mg/dL).   Medical History: Past Medical History:  Diagnosis Date  . Anxiety   . ARF (acute renal failure) ([place]) 08/07/2011   ?   Marland Kitchen Arthritis   . Cancer ([place]) 2005   breast-right  . Chronic back pain   . COPD (chronic obstructive pulmonary disease) ([place])   . Depression   . Depression with anxiety   . Dyspnea    on exertion  . Fibromyalgia   . GERD (gastroesophageal reflux disease)   . Hypercholesteremia   . Hypothyroidism   . Incontinence   . Neuropathy   . Peripheral neuropathy   . Sciatic pain   . Sebaceous cyst    lt labial location  . Sleep apnea    uses CPAP  . Stage III squamous cell carcinoma of right lung ([place]) 03/10/2017  . Tobacco abuse       Assessment: 13 y/oF with PMH of stage III squamous cell carcinoma of the lung, COPD, depression, anxiety, chronic pain, hypothyroidism who presented to [facility] ED on 05/16/2017 with diarrhea, elevated liver enzymes, and AMS. Troponin elevated in the ED. Pharmacy consulted to start IV heparin for possible ACS. Patient not on any anticoagulants PTA. Concomitant ASA noted. Baseline aPTT and PT/INR WNL. CBC reveals decreased Hgb of 9 and decreased platelet count of 121K.   Goal of Therapy:  Heparin level 0.3-0.7 units/ml Monitor platelets by anticoagulation protocol: Yes   Plan:  Discontinue SQ enoxaparin.  Heparin 3500 units IV bolus x 1, then start heparin infusion at 700 units/hr. Check heparin level 6 hours after infusion initiated.  Daily CBC and heparin level-closely monitor Hgb, Plt.  Monitor for s/sx of bleeding.    Lindell Spar, PharmD, BCPS Pager: [phone] 05/16/2017 10:00 PM

## 2017-05-16 NOTE — ED Notes (Signed)
Ultrasound tech states patient refused to stay still or let her touch patient. Friend at bedside attempted to help calm patient but was not able to. Patient still anxious and pulling at IVs.

## 2017-05-16 NOTE — Progress Notes (Signed)
Pt transported from ED to CT and back, on vent 100% fio2.  Pt tolerated transport well without incident.

## 2017-05-16 NOTE — Progress Notes (Signed)
Pt transported from ED to ICU 1240 on vent 100% fio2.  Pt tolerated transport well without incident.  Fio2 weaned down to 40%, Rn aware.

## 2017-05-16 NOTE — ED Triage Notes (Signed)
[facility] for weakness and diarrhea onset of this am. Pt is lung cancer and had chemo last week.

## 2017-05-16 NOTE — ED Notes (Signed)
This RN paged hospitalist regarding patient being extremely restless, not following commands, and friend at bedside stating this is not usual for patient. Hospitalist to order head CT, ativan, and Air cabin crew.

## 2017-05-16 NOTE — ED Notes (Signed)
Non-violent restraints removed. 

## 2017-05-16 NOTE — ED Notes (Signed)
Hospitalist paged due to patient unable to lay still. Patient behavior not changed after medication. Covering hospitalist paged regarding reevaluation of patient mental status. Unable to obtain vitals or keep patient on cardiac monitor.

## 2017-05-16 NOTE — ED Notes (Signed)
This RN spoke with hospitalist about suggesting bed placement changed to step down. Hospitalist stated will order haldol prior to changing bed status.

## 2017-05-17 ENCOUNTER — Inpatient Hospital Stay (HOSPITAL_COMMUNITY): Payer: Medicaid Other

## 2017-05-17 ENCOUNTER — Other Ambulatory Visit (HOSPITAL_COMMUNITY): Payer: Medicaid Other

## 2017-05-17 ENCOUNTER — Encounter (HOSPITAL_COMMUNITY): Payer: Self-pay | Admitting: Cardiology

## 2017-05-17 DIAGNOSIS — K759 Inflammatory liver disease, unspecified: Secondary | ICD-10-CM

## 2017-05-17 DIAGNOSIS — J9622 Acute and chronic respiratory failure with hypercapnia: Secondary | ICD-10-CM

## 2017-05-17 LAB — GLUCOSE, CAPILLARY
Glucose-Capillary: 111 mg/dL — ABNORMAL HIGH (ref 65–99)
Glucose-Capillary: 134 mg/dL — ABNORMAL HIGH (ref 65–99)

## 2017-05-17 LAB — TROPONIN I
TROPONIN I: 0.23 ng/mL — AB (ref <0.03)
Troponin I: 0.4 ng/mL (ref <0.03)
Troponin I: 0.36 ng/mL (ref <0.03)

## 2017-05-17 LAB — RESPIRATORY PANEL BY PCR
ADENOVIRUS-RVPPCR: NOT DETECTED
Bordetella pertussis: NOT DETECTED
CHLAMYDOPHILA PNEUMONIAE-RVPPCR: NOT DETECTED
CORONAVIRUS HKU1-RVPPCR: NOT DETECTED
CORONAVIRUS NL63-RVPPCR: NOT DETECTED
Coronavirus 229E: NOT DETECTED
Coronavirus OC43: NOT DETECTED
INFLUENZA A-RVPPCR: NOT DETECTED
Influenza B: NOT DETECTED
MYCOPLASMA PNEUMONIAE-RVPPCR: NOT DETECTED
Metapneumovirus: NOT DETECTED
Parainfluenza Virus 1: NOT DETECTED
Parainfluenza Virus 2: NOT DETECTED
Parainfluenza Virus 3: NOT DETECTED
Parainfluenza Virus 4: NOT DETECTED
Respiratory Syncytial Virus: NOT DETECTED
Rhinovirus / Enterovirus: NOT DETECTED

## 2017-05-17 LAB — CBC
HCT: 22.8 % — ABNORMAL LOW (ref 36.0–46.0)
HEMATOCRIT: 24 % — AB (ref 36.0–46.0)
HEMOGLOBIN: 7.3 g/dL — AB (ref 12.0–15.0)
Hemoglobin: 7.7 g/dL — ABNORMAL LOW (ref 12.0–15.0)
MCH: 29.6 pg (ref 26.0–34.0)
MCH: 29.5 pg (ref 26.0–34.0)
MCHC: 32 g/dL (ref 30.0–36.0)
MCHC: 32.1 g/dL (ref 30.0–36.0)
MCV: 92.3 fL (ref 78.0–100.0)
MCV: 92 fL (ref 78.0–100.0)
PLATELETS: 93 10*3/uL — AB (ref 150–400)
Platelets: 92 10*3/uL — ABNORMAL LOW (ref 150–400)
RBC: 2.61 MIL/uL — ABNORMAL LOW (ref 3.87–5.11)
RBC: 2.47 MIL/uL — ABNORMAL LOW (ref 3.87–5.11)
RDW: 24.7 % — ABNORMAL HIGH (ref 11.5–15.5)
RDW: 24.6 % — AB (ref 11.5–15.5)
WBC: 7.9 10*3/uL (ref 4.0–10.5)
WBC: 7 10*3/uL (ref 4.0–10.5)

## 2017-05-17 LAB — CK TOTAL AND CKMB (NOT AT ARMC)
CK, MB: 5.7 ng/mL — ABNORMAL HIGH (ref 0.5–5.0)
RELATIVE INDEX: 4.5 — AB (ref 0.0–2.5)
Total CK: 127 U/L (ref 38–234)

## 2017-05-17 LAB — IRON AND TIBC
IRON: 80 ug/dL (ref 28–170)
Saturation Ratios: 39 % — ABNORMAL HIGH (ref 10.4–31.8)
TIBC: 204 ug/dL — AB (ref 250–450)
UIBC: 124 ug/dL

## 2017-05-17 LAB — COMPREHENSIVE METABOLIC PANEL
ALBUMIN: 2.5 g/dL — AB (ref 3.5–5.0)
ALT: 432 U/L — AB (ref 14–54)
AST: 450 U/L — AB (ref 15–41)
Alkaline Phosphatase: 88 U/L (ref 38–126)
Anion gap: 11 (ref 5–15)
BUN: 11 mg/dL (ref 6–20)
CHLORIDE: 102 mmol/L (ref 101–111)
CO2: 28 mmol/L (ref 22–32)
CREATININE: 0.68 mg/dL (ref 0.44–1.00)
Calcium: 6.9 mg/dL — ABNORMAL LOW (ref 8.9–10.3)
GFR calc Af Amer: >60 mL/min (ref >60)
GLUCOSE: 91 mg/dL (ref 65–99)
POTASSIUM: 2.8 mmol/L — AB (ref 3.5–5.1)
Sodium: 141 mmol/L (ref 135–145)
Total Bilirubin: 0.8 mg/dL (ref 0.3–1.2)
Total Protein: 5.7 g/dL — ABNORMAL LOW (ref 6.5–8.1)

## 2017-05-17 LAB — MAGNESIUM
MAGNESIUM: 1.7 mg/dL (ref 1.7–2.4)
MAGNESIUM: 0.9 mg/dL — AB (ref 1.7–2.4)
MAGNESIUM: 0.9 mg/dL — AB (ref 1.7–2.4)
Magnesium: 1 mg/dL — ABNORMAL LOW (ref 1.7–2.4)

## 2017-05-17 LAB — BASIC METABOLIC PANEL
ANION GAP: 12 (ref 5–15)
BUN: 11 mg/dL (ref 6–20)
CALCIUM: 6.7 mg/dL — AB (ref 8.9–10.3)
CO2: 27 mmol/L (ref 22–32)
CREATININE: 0.64 mg/dL (ref 0.44–1.00)
Chloride: 101 mmol/L (ref 101–111)
GFR calc Af Amer: >60 mL/min (ref >60)
GFR calc non Af Amer: >60 mL/min (ref >60)
GLUCOSE: 82 mg/dL (ref 65–99)
Potassium: 2.9 mmol/L — ABNORMAL LOW (ref 3.5–5.1)
Sodium: 140 mmol/L (ref 135–145)

## 2017-05-17 LAB — AMMONIA: Ammonia: 21 umol/L (ref 9–35)

## 2017-05-17 LAB — PHOSPHORUS
Phosphorus: 3.6 mg/dL (ref 2.5–4.6)
Phosphorus: 3.3 mg/dL (ref 2.5–4.6)

## 2017-05-17 LAB — BLOOD GAS, ARTERIAL
Acid-Base Excess: 3.8 mmol/L — ABNORMAL HIGH (ref 0.0–2.0)
Bicarbonate: 28.3 mmol/L — ABNORMAL HIGH (ref 20.0–28.0)
Drawn by: 270211
FIO2: 0.3
MECHVT: 420 mL
O2 Saturation: 91.8 %
PEEP: 5 cmH2O
Patient temperature: 98.6
RATE: 18 resp/min
pCO2 arterial: 44.4 mmHg (ref 32.0–48.0)
pH, Arterial: 7.421 (ref 7.350–7.450)
pO2, Arterial: 68.1 mmHg — ABNORMAL LOW (ref 83.0–108.0)

## 2017-05-17 LAB — PROCALCITONIN
PROCALCITONIN: 1.33 ng/mL
PROCALCITONIN: 1.46 ng/mL

## 2017-05-17 LAB — HEPARIN LEVEL (UNFRACTIONATED)
Heparin Unfractionated: <0.1 IU/mL — ABNORMAL LOW (ref 0.30–0.70)
Heparin Unfractionated: <0.1 IU/mL — ABNORMAL LOW (ref 0.30–0.70)

## 2017-05-17 LAB — FERRITIN: Ferritin: 778 ng/mL — ABNORMAL HIGH (ref 11–307)

## 2017-05-17 LAB — PROTIME-INR
INR: 1.35
PROTHROMBIN TIME: 16.6 s — AB (ref 11.4–15.2)

## 2017-05-17 LAB — TRIGLYCERIDES: TRIGLYCERIDES: 203 mg/dL — AB (ref <150)

## 2017-05-17 LAB — MRSA PCR SCREENING: MRSA by PCR: NEGATIVE

## 2017-05-17 LAB — INFLUENZA PANEL BY PCR (TYPE A & B)
INFLBPCR: NEGATIVE
Influenza A By PCR: NEGATIVE

## 2017-05-17 LAB — ETHANOL: Alcohol, Ethyl (B): <5 mg/dL (ref <5)

## 2017-05-17 LAB — ACETAMINOPHEN LEVEL

## 2017-05-17 LAB — LACTIC ACID, PLASMA
Lactic Acid, Venous: 0.7 mmol/L (ref 0.5–1.9)
Lactic Acid, Venous: 0.8 mmol/L (ref 0.5–1.9)

## 2017-05-17 LAB — SALICYLATE LEVEL

## 2017-05-17 MED ORDER — VANCOMYCIN HCL IN DEXTROSE 1-5 GM/200ML-% IV SOLN: 1000.0000 mg | Freq: Once | INTRAVENOUS | Status: DC

## 2017-05-17 MED ORDER — POTASSIUM CHLORIDE 20 MEQ/15ML (10%) PO SOLN
40.0000 meq | Freq: Once | ORAL | Status: AC
  Administered 2017-05-17: 40 meq
  Filled 2017-05-17: qty 30

## 2017-05-17 MED ORDER — SODIUM CHLORIDE 0.9 % IV SOLN
20.0000 ug/h | INTRAVENOUS | Status: DC
  Administered 2017-05-17: 20 ug/h via INTRAVENOUS
  Filled 2017-05-17: qty 50

## 2017-05-17 MED ORDER — AZTREONAM IN DEXTROSE 2 GM/50ML IV SOLN
2.0000 g | Freq: Three times a day (TID) | INTRAVENOUS | Status: DC
  Administered 2017-05-17 – 2017-05-19 (×6): 2 g via INTRAVENOUS
  Filled 2017-05-17 (×9): qty 50

## 2017-05-17 MED ORDER — PROPOFOL 1000 MG/100ML IV EMUL
5.0000 ug/kg/min | INTRAVENOUS | Status: DC
  Administered 2017-05-17: 70 ug/kg/min via INTRAVENOUS
  Administered 2017-05-17: 65 ug/kg/min via INTRAVENOUS
  Administered 2017-05-17 (×2): 80 ug/kg/min via INTRAVENOUS
  Filled 2017-05-17 (×4): qty 100

## 2017-05-17 MED ORDER — POTASSIUM CHLORIDE 10 MEQ/100ML IV SOLN
10.0000 meq | INTRAVENOUS | Status: AC
  Administered 2017-05-17 (×4): 10 meq via INTRAVENOUS
  Filled 2017-05-17 (×3): qty 100

## 2017-05-17 MED ORDER — MAGNESIUM SULFATE 4 GM/100ML IV SOLN
4.0000 g | Freq: Once | INTRAVENOUS | Status: AC
  Administered 2017-05-17: 4 g via INTRAVENOUS
  Filled 2017-05-17: qty 100

## 2017-05-17 MED ORDER — DEXTROSE 5 % IV SOLN
2.0000 g | Freq: Three times a day (TID) | INTRAVENOUS | Status: DC
  Administered 2017-05-17: 2 g via INTRAVENOUS
  Filled 2017-05-17 (×3): qty 2

## 2017-05-17 MED ORDER — ASPIRIN 81 MG PO CHEW: 81.0000 mg | CHEWABLE_TABLET | Freq: Every day | ORAL | Status: DC

## 2017-05-17 MED ORDER — CHLORHEXIDINE GLUCONATE 0.12% ORAL RINSE (MEDLINE KIT)
15.0000 mL | Freq: Two times a day (BID) | OROMUCOSAL | Status: DC
  Administered 2017-05-17 – 2017-05-21 (×7): 15 mL via OROMUCOSAL

## 2017-05-17 MED ORDER — HEPARIN (PORCINE) IN NACL 100-0.45 UNIT/ML-% IJ SOLN: 1350.0000 [IU]/h | INTRAMUSCULAR | Status: DC

## 2017-05-17 MED ORDER — METRONIDAZOLE IN NACL 5-0.79 MG/ML-% IV SOLN
500.0000 mg | Freq: Three times a day (TID) | INTRAVENOUS | Status: DC
  Administered 2017-05-17 – 2017-05-20 (×11): 500 mg via INTRAVENOUS
  Filled 2017-05-17 (×11): qty 100

## 2017-05-17 MED ORDER — METRONIDAZOLE IN NACL 5-0.79 MG/ML-% IV SOLN
500.0000 mg | Freq: Once | INTRAVENOUS | Status: AC
  Administered 2017-05-17: 500 mg via INTRAVENOUS
  Filled 2017-05-17: qty 100

## 2017-05-17 MED ORDER — LACTATED RINGERS IV BOLUS (SEPSIS)
1000.0000 mL | Freq: Once | INTRAVENOUS | Status: AC
  Administered 2017-05-17: 1000 mL via INTRAVENOUS

## 2017-05-17 MED ORDER — VANCOMYCIN HCL IN DEXTROSE 1-5 GM/200ML-% IV SOLN
1000.0000 mg | Freq: Two times a day (BID) | INTRAVENOUS | Status: DC
  Administered 2017-05-17 – 2017-05-19 (×5): 1000 mg via INTRAVENOUS
  Filled 2017-05-17 (×5): qty 200

## 2017-05-17 MED ORDER — FENTANYL CITRATE (PF) 100 MCG/2ML IJ SOLN
100.0000 ug | INTRAMUSCULAR | Status: DC | PRN
  Administered 2017-05-17: 100 ug via INTRAVENOUS
  Filled 2017-05-17 (×3): qty 2

## 2017-05-17 MED ORDER — ORAL CARE MOUTH RINSE
15.0000 mL | Freq: Four times a day (QID) | OROMUCOSAL | Status: DC
  Administered 2017-05-17 – 2017-05-20 (×14): 15 mL via OROMUCOSAL

## 2017-05-17 MED ORDER — DEXTROSE-NACL 5-0.45 % IV SOLN
INTRAVENOUS | Status: AC
  Administered 2017-05-17: 04:00:00 via INTRAVENOUS

## 2017-05-17 MED ORDER — HEPARIN (PORCINE) IN NACL 100-0.45 UNIT/ML-% IJ SOLN
900.0000 [IU]/h | INTRAMUSCULAR | Status: DC
  Administered 2017-05-17 (×2): 900 [IU]/h via INTRAVENOUS
  Filled 2017-05-17: qty 250

## 2017-05-17 MED ORDER — DEXTROSE 5 % IV SOLN
2.0000 g | Freq: Once | INTRAVENOUS | Status: AC
  Administered 2017-05-17: 2 g via INTRAVENOUS
  Filled 2017-05-17: qty 2

## 2017-05-17 MED ORDER — HEPARIN BOLUS VIA INFUSION
2000.0000 [IU] | Freq: Once | INTRAVENOUS | Status: AC
  Administered 2017-05-17: 2000 [IU] via INTRAVENOUS
  Filled 2017-05-17: qty 2000

## 2017-05-17 MED ORDER — FENTANYL CITRATE (PF) 100 MCG/2ML IJ SOLN
100.0000 ug | INTRAMUSCULAR | Status: DC | PRN
  Administered 2017-05-17 – 2017-05-18 (×7): 100 ug via INTRAVENOUS
  Filled 2017-05-17 (×5): qty 2

## 2017-05-17 MED ORDER — DEXMEDETOMIDINE HCL IN NACL 200 MCG/50ML IV SOLN
0.0000 ug/kg/h | INTRAVENOUS | Status: DC
  Administered 2017-05-17: 0.8 ug/kg/h via INTRAVENOUS
  Administered 2017-05-17: 0.4 ug/kg/h via INTRAVENOUS
  Administered 2017-05-17 – 2017-05-18 (×4): 1.2 ug/kg/h via INTRAVENOUS
  Filled 2017-05-17 (×9): qty 50

## 2017-05-17 NOTE — Consult Note (Signed)
Cardiology Consultation:   Patient ID: Sydney Blair; [id number]; 07/05/1956   Admit date: 05/16/2017 Date of Consult: 05/17/2017  Primary Care Provider: [name], MD Primary Cardiologist: New Dr. Percival Spanish  Primary Electrophysiologist:  NA   Patient Profile:   Sydney Blair is a 61 y.o. female with a hx of COPD, Rt lung cancer, sleep apnea, hypothyroid, HLD, GERD and anxiety who is being seen today for the evaluation of EKGs and elevated troponin at the request of DR. Nestor.  History of Present Illness:   Sydney Blair has  a hx of COPD, Rt lung cancer, sleep apnea, hypothyroid, HLD, SVT,  GERD and anxiety was admitted 05/16/17 after presenting with diarrhea.  She also had vomiting yesterday.  She denied chest pain.  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 Her last radiation therapy was around 9/17 (this was rescheduled and they note that she received this, but I don't see it in the chart).  She received cycle 6 of paclitaxel and carboplatin on 9/10.    Once admitted she developed restlessness and disorientation progressing to obtundation and she was intubated and CCM is following.  Hyper carb resp failure. + PNA,   K+ was 3.1, Na 140, AST 854, ALT 594  LDH 1283, Cr 0.76 Hgb 9.0 she had been around 10, WBC 9.3, plts 121 had been 181 to 230.  HGB now down to 7.3  K+ 2.9 today  Troponin 0.23 and follow up 0.36; 0.40; 0.36; 0.23  CK 127 and MB 5.7    EKG:  The EKGs was personally reviewed and demonstrates:  SR to ST with nonspecific ST abnormalities. Prolonged QTc 586 ms.  Today's EKG SR with non specific ST abnormalities and prolonged QT but improved. 513 ms Telemetry:  Telemetry was personally reviewed and demonstrates:  SR to ST with PACs   Prior Echo 05/19/2010 with EF 60-65% RV with mild to mod.  Increased wall thickness. This was done for SVT.   Currently sedated  on propofol - on vent , and has OG.    Past Medical History:  Diagnosis Date  . Anxiety   . ARF (acute renal failure) ([place]) 08/07/2011   ?   Marland Kitchen Arthritis   . Cancer ([place]) 2005   breast-right  . Chronic back pain   . COPD (chronic obstructive pulmonary disease) ([place])   . Depression   . Depression with anxiety   . Dyspnea    on exertion  . Fibromyalgia   . GERD (gastroesophageal reflux disease)   . Hypercholesteremia   . Hypothyroidism   . Incontinence   . Neuropathy   . Peripheral neuropathy   . Sciatic pain   . Sebaceous cyst    lt labial location  . Sleep apnea    uses CPAP  . Stage III squamous cell carcinoma of right lung ([place]) 03/10/2017  . Tobacco abuse     Past Surgical History:  Procedure Laterality Date  . bladder tack    . BREAST BIOPSY Right 12/29/2016   Procedure: RIGHT BREAST BIOPSY;  Surgeon: Aviva Signs, MD;  Location: [facility];  Service: General;  Laterality: Right;  . BREAST LUMPECTOMY     right with node dissection  . COLONOSCOPY  11/11/2010   SEG:[id number] rectal polyp and splenic flexure otherwise normal; pathology showed  tubular adenomas. Next TCS 10/2015.  Marland Kitchen ESOPHAGOGASTRODUODENOSCOPY  11/11/2010   PXT:GGYIR hiatal hernia, 57 [place]  dilator  . ESOPHAGOGASTRODUODENOSCOPY (EGD) WITH ESOPHAGEAL DILATION N/A 06/21/2013   GSJ:FZCLAK esophagus  - status post Maloney dilation s/p bx (benign esophageal bx)  . VIDEO BRONCHOSCOPY WITH ENDOBRONCHIAL ULTRASOUND N/A 02/28/2017   Procedure: VIDEO BRONCHOSCOPY WITH ENDOBRONCHIAL ULTRASOUND;  Surgeon: Lupita Leash, MD;  Location: [facility];  Service: Thoracic;  Laterality: N/A;     Home Medications:  Prior to Admission medications   Medication Sig Start Date End Date Taking? Authorizing Provider  albuterol (PROVENTIL HFA;VENTOLIN HFA) 108 (90 BASE) MCG/ACT inhaler Inhale 2 puffs into the lungs every 6 (six) hours as needed for wheezing or shortness of breath.   Yes [provider]  aspirin 81 MG chewable  tablet Chew 81 mg by mouth daily.   Yes [provider]  budesonide-formoterol (SYMBICORT) 160-4.5 MCG/ACT inhaler Inhale 2 puffs into the lungs daily.    Yes [provider]  CALCIUM PO Take 1 tablet by mouth daily.   Yes [provider]  cetirizine (ZYRTEC) 10 MG tablet Take 10 mg by mouth daily.   Yes [provider]  dexlansoprazole (DEXILANT) 60 MG capsule Take 1 capsule (60 mg total) by mouth daily before breakfast. 09/06/16  Yes Tiffany Kocher, PA-C  diclofenac sodium (VOLTAREN) 1 % GEL Apply 4 g topically 4 (four) times daily.   Yes [provider]  DULoxetine (CYMBALTA) 60 MG capsule Take 60 mg by mouth daily.   Yes [provider]  fluticasone (FLONASE) 50 MCG/ACT nasal spray Place 2 sprays into both nostrils daily.   Yes [provider]  gabapentin (NEURONTIN) 800 MG tablet Take 800 mg by mouth 4 (four) times daily. 07/20/16  Yes [provider]  levothyroxine (SYNTHROID, LEVOTHROID) 100 MCG tablet Take 100 mcg by mouth daily before breakfast. Reported on 02/03/2016   Yes [provider]  meclizine (ANTIVERT) 25 MG tablet Take 25 mg by mouth daily.   Yes [provider]  methocarbamol (ROBAXIN) 500 MG tablet Take 500 mg by mouth 4 (four) times daily.    Yes [provider]  nortriptyline (PAMELOR) 25 MG capsule Take 25 mg by mouth at bedtime.   Yes [provider]  olopatadine (PATANOL) 0.1 % ophthalmic solution Place 1 drop into both eyes 2 (two) times daily.    Yes [provider]  ondansetron (ZOFRAN) 8 MG tablet Take 1 tablet (8 mg total) by mouth every 8 (eight) hours as needed for nausea or vomiting. 04/01/17  Yes Si Gaul, MD  pravastatin (PRAVACHOL) 40 MG tablet Take 40 mg by mouth every evening.   Yes [provider]  sucralfate (CARAFATE) 1 GM/10ML suspension Take 1 g by mouth 4 (four) times daily -  with meals and at bedtime.   Yes [provider]  Tapentadol HCl (NUCYNTA) 100 MG TABS Take 100 mg by mouth 2 (two) times daily.   Yes [provider]  tiotropium (SPIRIVA) 18 MCG inhalation capsule Place 1 capsule (18 mcg total) into inhaler and inhale daily. 01/31/15  Yes Clance, Maree Krabbe, MD  TRINTELLIX 10 MG TABS Take 10 mg by mouth daily. 07/21/16  Yes [provider]  zolpidem (AMBIEN) 10 MG tablet Take 10 mg by mouth at bedtime as needed for sleep.   Yes [provider]  Potassium Chloride ER 20 MEQ TBCR Take 1 tablet on Monday, Wednesday, and Friday. Patient not taking: Reported on 05/16/2017 03/23/17   Lupita Leash, MD  prochlorperazine (COMPAZINE) 10 MG tablet Take 1 tablet (10  mg total) by mouth every 6 (six) hours as needed for nausea or vomiting. Patient not taking: Reported on 05/16/2017 03/28/17   Maryanna Shape, NP  sucralfate (CARAFATE) 1 g tablet Take 1 tablet (1 g total) by mouth 4 (four) times daily -  with meals and at bedtime. Crush and dissolve tablet in 15 ml of water. Patient not taking: Reported on 05/16/2017 05/03/17   Gery Pray, MD  Wound Dressings [facility]) Apply 1 application topically 3 (three) times daily.    [provider]    Inpatient Medications: Scheduled Meds: . [START ON 05/18/2017] aspirin  81 mg Per Tube Daily  . budesonide (PULMICORT) nebulizer solution  0.5 mg Nebulization BID  . chlorhexidine gluconate (MEDLINE KIT)  15 mL Mouth Rinse BID  . ipratropium-albuterol  3 mL Nebulization Q6H  . levothyroxine  100 mcg Oral QAC breakfast  . mouth rinse  15 mL Mouth Rinse QID  . olopatadine  1 drop Both Eyes BID  . pantoprazole (PROTONIX) IV  40 mg Intravenous QHS   Continuous Infusions: . aztreonam    . dexmedetomidine (PRECEDEX) IV infusion 0.6 mcg/kg/hr (05/17/17 1246)  . heparin 900 Units/hr (05/17/17 1247)  . metronidazole Stopped (05/17/17 1000)  . vancomycin Stopped (05/17/17 0653)   PRN Meds: albuterol, fentaNYL (SUBLIMAZE) injection,  fentaNYL (SUBLIMAZE) injection  Allergies:    Allergies  Allergen Reactions  . Penicillins Anaphylaxis and Other (See Comments)    Has patient had a PCN reaction causing immediate rash, facial/tongue/throat swelling, SOB or lightheadedness with hypotension:Yes Has patient had a PCN reaction causing severe rash involving mucus membranes or skin necrosis:Yes Has patient had a PCN reaction that required hospitalization:No Has patient had a PCN reaction occurring within the last 10 years:No If all of the above answers are "NO", then may proceed with Cephalosporin use.     Social History:   Social History   Social History  . Marital status: Married    Spouse name: N/A  . Number of children: 1  . Years of education: N/A   Occupational History  . disabled Unemployed   Social History Main Topics  . Smoking status: Current Every Day Smoker    Packs/day: 1.00    Years: 35.00    Types: Cigarettes  . Smokeless tobacco: Never Used     Comment: has cut back to one pack daily  . Alcohol use No  . Drug use: No  . Sexual activity: Yes    Birth control/ protection: Post-menopausal     Comment: chemo stopped her periods   Other Topics Concern  . Not on file   Social History Narrative   Lives w. Husband   Lost 1 son-?etiology    Family History:    Family History  Problem Relation Age of Onset  . Cancer Other   . Heart attack Other   . Lung disease Other   . Emphysema Mother   . Autoimmune disease Neg Hx      ROS:  Please see the history of present illness.  ROS pt unable to respond, information form H&P  General:no colds or fevers, no weight changes Skin:no rashes or ulcers HEENT:no blurred vision, no congestion CV:see HPI PUL:see HPI GI:no diarrhea constipation or melena, no indigestion GU:no hematuria, no dysuria MS:no joint pain, no claudication Neuro:no syncope, no lightheadedness Endo:no diabetes, no thyroid disease  Physical Exam/Data:   Vitals:   05/17/17  0700 05/17/17 0800 05/17/17 0920 05/17/17 1245  BP: (!) 110/48 (!) 106/46  Pulse: 82 82    Resp: (!) 21 20    Temp:  98.1 F (36.7 C)    TempSrc:  Oral    SpO2: 96% 95% 93% 98%  Weight:      Height:        Intake/Output Summary (Last 24 hours) at 05/17/17 1304 Last data filed at 05/17/17 8882  Gross per 24 hour  Intake          4073.93 ml  Output             1200 ml  Net          2873.93 ml   Filed Weights   05/16/17 1326 05/17/17 0000 05/17/17 0339  Weight: 158 lb (71.7 kg) 167 lb 8.8 oz (76 kg) 168 lb 14 oz (76.6 kg)   Body mass index is 31.91 kg/m.  General:  Well nourished, well developed,sedated on vent HEENT: normal Lymph: no adenopathy Neck: no JVD Endocrine:  No thryomegaly Vascular: No carotid bruits; 2+ pedal pulses  Cardiac:  normal S1, S2; RRR; no murmur gallup rub or clic, Lungs:  clear to auscultation bilaterally, ant. no wheezing, rhonchi or rales  Abd: soft, nontender, no hepatomegaly  Ext: no edema Musculoskeletal:  No deformities, sedated unable to follow commands Skin: warm and dry  Neuro:  Sedated, had AMS prior to intubation Psych: sedated    Relevant CV Studies: See above for previous Echo from 2011, new echo pending  Laboratory Data:  Chemistry Recent Labs Lab 05/16/17 1429 05/17/17 0020 05/17/17 0212  NA 140 141 140  K 3.1* 2.8* 2.9*  CL 95* 102 101  CO2 '31 28 27  '$ GLUCOSE 81 91 82  BUN '14 11 11  '$ CREATININE 0.76 0.68 0.64  CALCIUM 7.6* 6.9* 6.7*  GFRNONAA >60 >60 >60  GFRAA >60 >60 >60  ANIONGAP '14 11 12     '$ Recent Labs Lab 05/16/17 1429 05/17/17 0020  PROT 6.8 5.7*  ALBUMIN 3.1* 2.5*  AST 854* 450*  ALT 594* 432*  ALKPHOS 106 88  BILITOT 1.3* 0.8   Hematology Recent Labs Lab 05/16/17 1429 05/17/17 0020 05/17/17 0212  WBC 9.3 7.9 7.0  RBC 3.16*  3.17* 2.61* 2.47*  HGB 9.0* 7.7* 7.3*  HCT 27.9* 24.0* 22.8*  MCV 88.3 92.0 92.3  MCH 28.5 29.5 29.6  MCHC 32.3 32.1 32.0  RDW 24.0* 24.6* 24.7*  PLT 121* 93*  92*   Cardiac Enzymes Recent Labs Lab 05/16/17 1733 05/17/17 0020 05/17/17 0212  TROPONINI 0.36* 0.40* 0.36*    Recent Labs Lab 05/16/17 1440  TROPIPOC 0.23*    BNPNo results for input(s): BNP, PROBNP in the last 168 hours.  DDimer No results for input(s): DDIMER in the last 168 hours.  Radiology/Studies:  Ct Head Wo Contrast  Result Date: 05/16/2017 CLINICAL DATA:  Altered mental status EXAM: CT HEAD WITHOUT CONTRAST TECHNIQUE: Contiguous axial images were obtained from the base of the skull through the vertex without intravenous contrast. COMPARISON:  Brain MRI 03/18/2017 FINDINGS: Brain: No mass lesion, intraparenchymal hemorrhage or extra-axial collection. No evidence of acute cortical infarct. Hypoattenuation in the right frontal white matter likely corresponds to prior infarct. There is periventricular hypoattenuation compatible with chronic microvascular disease. Vascular: No hyperdense vessel or unexpected calcification. Skull: Normal visualized skull base, calvarium and extracranial soft tissues. 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 Sinuses/Orbits: No sinus fluid levels or advanced mucosal thickening. No mastoid effusion. Normal orbits. IMPRESSION: 1. No acute intracranial abnormality. 2. 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 Electronically Signed   By: Ulyses Jarred M.D.   On: 05/16/2017 22:57   Ct Abdomen Pelvis W Contrast  Result Date: 05/16/2017 CLINICAL DATA:  Diarrhea. Elevated liver enzymes. Lung cancer patient with ongoing chemotherapy. EXAM: CT ABDOMEN AND PELVIS WITH CONTRAST TECHNIQUE: Multidetector CT imaging of the abdomen and pelvis was performed using the standard protocol following bolus administration of intravenous contrast. CONTRAST:  100 cc Isovue-300 IV COMPARISON:  PET CT 02/11/2017 FINDINGS: Intubated patient, motion artifact despite sedation. Lower chest: Breathing motion artifact. Mild dependent atelectasis and possible trace  pleural effusions. Hepatobiliary: No focal hepatic lesion. Mild gallbladder distention without calcified gallstone. No gross biliary dilatation, motion obscures detailed evaluation. Pancreas: No ductal dilatation. No gross peripancreatic inflammation, motion limited exam. Spleen: Normal in size without focal abnormality. Adrenals/Urinary Tract: No adrenal nodule. Left hydronephrosis and dilatation of the renal pelvis, as seen on prior PET. No obstructing stone. The ureter appears decompressed. Slight diminished left renal enhancement and excretion. No right hydronephrosis. Calcification in the mid upper right kidney appears vascular. Urinary bladder is distended without wall thickening. Stomach/Bowel: Enteric tube in place with tip in the duodenum. Stomach physiologically distended with gastric contents. No gastric wall thickening. Bowel evaluation limited by motion artifact. Liquid stool in the ascending colon without colonic wall thickening. Air-filled nondilated descending and sigmoid colon. No evidence of colonic inflammatory change. Appendix appears normal. Vascular/Lymphatic: Aortic atherosclerosis without aneurysm. Limited assessment for adenopathy given motion. No bulky adenopathy is seen. Reproductive: 6 cm uterine fibroid causing leftward uterine deviation, similar to prior PET. No adnexal mass, ovaries tentatively identified and quiescent. Other: No evidence of free air allowing for motion. Small amount of free fluid in the pelvis, nonspecific. Possible trace perihepatic ascites. Musculoskeletal: Anterolisthesis of L5 on S1 with degenerative disc disease and facet arthropathy. Milder degenerative change throughout the remainder of the lower thoracic and lumbar spine. No evidence of focal bone lesion. Remote right rib fractures. IMPRESSION: 1. No evidence of colonic inflammation to suggest colitis. Minimal liquid stool in the ascending colon which can be seen with diarrheal process. Motion limits detailed  assessment despite sedation. 2. Mild gallbladder distention without calcified gallstone. 3. 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 4. Distended urinary bladder without wall thickening. 5. Uterine fibroid. 6.  Aortic Atherosclerosis (ICD10-I70.0). Electronically Signed   By: Jeb Levering M.D.   On: 05/16/2017 23:01   Dg Chest Port 1 View  Result Date: 05/17/2017 CLINICAL DATA:  Endotracheal tube positioning EXAM: PORTABLE CHEST 1 VIEW COMPARISON:  Yesterday FINDINGS: Endotracheal tube tip is 1 cm above the carina. An orogastric tube reaches the stomach. Right upper lobe airspace disease is mildly improved from yesterday. Worsening aeration at the bases with indistinct diaphragm. This is likely atelectasis. Differences in patient positioning could contribute. No effusion or pneumothorax. Normal heart size and mediastinal contours. IMPRESSION: 1. Lower endotracheal tube, tip 1 cm above the carina. 2. Mild improvement in right upper lobe pneumonia. 3. Worsening aeration at the bases favoring atelectasis. Electronically Signed   By: Monte Fantasia M.D.   On: 05/17/2017 07:23   Dg Chest Portable 1 View  Result Date: 05/16/2017 CLINICAL DATA:  Intubation and gastric tube placement. Patient with lung cancer on chemoradiation therapy. EXAM: PORTABLE CHEST 1 VIEW COMPARISON:  CXR from 1507 hours FINDINGS: The heart size and mediastinal contours are within normal limits. Right upper lobe opacities are again visualized with differential considerations that may also include post treatment change, pneumonia and/or atelectasis  among some possibilities though not exclusive. Mild fullness of the right hilum as before. The tip of an endotracheal tube is 3.5 cm above the carina in satisfactory position. A gastric tube is seen extending below the left hemidiaphragm in the expected location of the stomach with the tip is excluded on current exam. Heart is borderline enlarged. Minimal aortic  atherosclerosis at the arch. IMPRESSION: 1. Satisfactory support line and tube positions to the extent visualized. 2. Patchy airspace opacities in the right upper lobe persists without change. Electronically Signed   By: Ashley Royalty M.D.   On: 05/16/2017 21:51   Dg Chest Port 1 View  Result Date: 05/16/2017 CLINICAL DATA:  61 year old female with weakness, tachycardia and diarrhea. Patient with lung cancer on chemoradiation therapy. EXAM: PORTABLE CHEST 1 VIEW COMPARISON:  02/11/2017 head CT. 12/08/2016 and prior chest radiographs FINDINGS: The cardiomediastinal silhouette is unchanged with right hilar fullness. Increased opacity within the peripheral right upper lobe noted. The left lung is clear. No pleural effusion or pneumothorax identified. The right upper lobe mass identified on 12/08/2016 is much less conspicuous. IMPRESSION: Increased opacity within the peripheral right upper lobe which may be related to treatment changes/atelectasis, but pneumonia/ pneumonitis or less likely metastatic disease is not excluded. Electronically Signed   By: Margarette Canada M.D.   On: 05/16/2017 15:23   US Abdomen Limited Ruq  Result Date: 05/17/2017 CLINICAL DATA:  Elevated liver enzymes EXAM: ULTRASOUND ABDOMEN LIMITED RIGHT UPPER QUADRANT COMPARISON:  None. FINDINGS: Gallbladder: There is sludge in the gallbladder. There are no echogenic foci in the gallbladder which move and shadow as is expected with gallstones. No gallbladder wall thickening or pericholecystic fluid. No sonographic Murphy sign noted by sonographer. Common bile duct: Diameter: 4 mm. There is no intrahepatic or extrahepatic biliary duct dilatation. Liver: No focal lesion identified. Within normal limits in parenchymal echogenicity. Portal vein is patent on color Doppler imaging with normal direction of blood flow towards the liver. IMPRESSION: Sludge in gallbladder. No gallstones or gallbladder wall thickening evident. Study otherwise unremarkable.  Electronically Signed   By: Lowella Grip III M.D.   On: 05/17/2017 12:57    Assessment and Plan:   1. Elevated troponin pk 0.40 may be demand ischemia in acute resp. Failure  Is on IV heparin. 2. Abnormal EKG with hypokalemia and acute illness, echo is pending previously normal EF 3.  acute hypercarb respiratory failure may be from sedation plan to wean per CCM 4. COPD stable followed by CCM 5. Transaminases, suspected shock liver LFTs decreasing. Followed by CCM  6.  hypokalemia replacing per CCM     For questions or updates, please contact [facility] Please consult [email or web address] for contact info under Cardiology/STEMI.   Signed, Cecilie Kicks, NP  05/17/2017 1:04 PM   History and all data above reviewed.  Patient examined.  I agree with the findings as above.  The patient is intubated and sedated.  We are called secondary to an elevated troponin.  She also has non specific changes on her EKG.  She was admitted for diarrhea and vomiting but developed AMS and respiratory distress.  She is now intubated and sedated.  She has no history of CAD .  She has had SVT.  There are no recent cardiac studies.   The patient exam reveals COR:RRR  ,  Lungs: Clear  ,  Abd: Positive bowel sounds, no rebound no guarding, Ext No edema  .  All available labs, radiology testing, previous records reviewed. Agree with  documented assessment and plan. ELEVATED TROPONIN:  I am not suspecting and acute coronary event.  Rather I would suspect demand ischemia.  Echo has been ordered.  Agree with heparin and ASA.    Jeneen Rinks Niam Nepomuceno  3:10 PM  05/17/2017

## 2017-05-17 NOTE — Progress Notes (Addendum)
ANTICOAGULATION CONSULT NOTE - Follow Up Consult  Pharmacy Consult for heparin Indication: r/o ACS  Allergies  Allergen Reactions  . Penicillins Anaphylaxis and Other (See Comments)    Has patient had a PCN reaction causing immediate rash, facial/tongue/throat swelling, SOB or lightheadedness with hypotension:Yes Has patient had a PCN reaction causing severe rash involving mucus membranes or skin necrosis:Yes Has patient had a PCN reaction that required hospitalization:No Has patient had a PCN reaction occurring within the last 10 years:No If all of the above answers are "NO", then may proceed with Cephalosporin use.     Patient Measurements: Height: 5\' 1"  (154.9 cm) Weight: 168 lb 14 oz (76.6 kg) IBW/kg (Calculated) : 47.8 Heparin Dosing Weight: 64 kg  Vital Signs: Temp: 98.1 F (36.7 C) (09/25 0800) Temp Source: Oral (09/25 0800) BP: 106/46 (09/25 0800) Pulse Rate: 82 (09/25 0800)  Labs:  Recent Labs  05/16/17 1429 05/16/17 1653 05/16/17 1733 05/17/17 0020 05/17/17 0212 05/17/17 0858  HGB 9.0*  --   --  7.7* 7.3*  --   HCT 27.9*  --   --  24.0* 22.8*  --   PLT 121*  --   --  93* 92*  --   APTT  --  28  --   --   --   --   LABPROT  --  15.0  --   --  16.6*  --   INR  --  1.19  --   --  1.35  --   HEPARINUNFRC  --   --   --   --   --  <0.10*  CREATININE 0.76  --   --  0.68 0.64  --   CKTOTAL  --   --   --  127  --   --   CKMB  --   --   --  5.7*  --   --   TROPONINI  --   --  0.36* 0.40* 0.36*  --     Estimated Creatinine Clearance: 70.9 mL/min (by C-G formula based on SCr of 0.64 mg/dL).   Assessment: Patient's a 61 y.o F with hx lung cancer on undergoing chemotherapy treatment presented to the ED on 05/16/17 with c/o weakness and diarrhea.  Heparin drip started on 9/24 for elevated troponin.  Today, 05/17/2017: - first heparin level now back undetectable. Per RN, no issues with IV line - hgb 7.3, plts low but somewhat stable - RN reported IV needle was out  of place this morning and pt had some bleeding on hand.    Goal of Therapy:  Heparin level 0.3-0.7 units/ml Monitor platelets by anticoagulation protocol: Yes   Plan:  - heparin 2000 units IV x1 bolus, then increase rate to 900 units/hr - check 6 hr heparin level   Miniya Miguez P 05/17/2017,12:03 PM

## 2017-05-17 NOTE — Care Management Note (Signed)
Case Management Note  Patient Details  Name: Sydney Blair MRN: [id number] Date of Birth: 05-29-1956  Subjective/Objective:                  61 year old woman actively undergoing treatment for squamous cell carcinoma of the lung presented to the emergency department by ambulance with complaints of vomiting and diarrhea. Throughout her emergency department stay and early after admission he developed restlessness and disorientation which eventually progressed to obtundation and she was intubated. Her ED course is notable for findings of elevated BUN/creatinine ratio, metabolic alkalosis, acute hypercapnic respiratory failure, profoundly elevated LFTs and an elevated troponin. Head CT and CT chest are largely unrevealing. Treatment course included IV fluids, broad-spectrum antibiotics, heparin drip.   Action/Plan: May 17, 2017 Velva Harman, BSN, [place],  [place] Chart reviewed for case management and discharge needs. Next review date: [id number]  Expected Discharge Date:                  Expected Discharge Plan:  Home/Self Care  In-House Referral:     Discharge planning Services  CM Consult  Post Acute Care Choice:    Choice offered to:     DME Arranged:    DME Agency:     HH Arranged:    HH Agency:     Status of Service:  In process, will continue to follow  If discussed at Long Length of Stay Meetings, dates discussed:    Additional Comments:  Leeroy Cha, RN 05/17/2017, 9:06 AM

## 2017-05-17 NOTE — Progress Notes (Signed)
Pharmacy Antibiotic Note  Sydney Blair is a 61 y.o. female admitted on 05/16/2017 with sepsis, Intra-abdominal infection.  Pharmacy has been consulted for Vancomycin, aztreonam, flagyl dosing.  Plan: Vancomycin 1gm IV every 12 hours.  Goal trough 15-20 mcg/mL.  Aztreonam 2gm iv q8hr Flagyl 500mg  iv q8hr  Height: 5\' 1"  (154.9 cm) Weight: 168 lb 14 oz (76.6 kg) IBW/kg (Calculated) : 47.8  Temp (24hrs), Avg:98.5 F (36.9 C), Min:98.1 F (36.7 C), Max:99 F (37.2 C)   Recent Labs Lab 05/16/17 1429 05/16/17 1441 05/17/17 0020 05/17/17 0212  WBC 9.3  --  7.9 7.0  CREATININE 0.76  --  0.68 0.64  LATICACIDVEN  --  0.97 0.8 0.7    Estimated Creatinine Clearance: 70.9 mL/min (by C-G formula based on SCr of 0.64 mg/dL).    Allergies  Allergen Reactions  . Penicillins Anaphylaxis and Other (See Comments)    Has patient had a PCN reaction causing immediate rash, facial/tongue/throat swelling, SOB or lightheadedness with hypotension:Yes Has patient had a PCN reaction causing severe rash involving mucus membranes or skin necrosis:Yes Has patient had a PCN reaction that required hospitalization:No Has patient had a PCN reaction occurring within the last 10 years:No If all of the above answers are "NO", then may proceed with Cephalosporin use.     Antimicrobials this admission: Levofloxacin 05/16/2017 x1 Vancomycin 05/16/2017 >> Aztreonam 05/16/2017 >> Flagyl 05/16/2017 >>  Dose adjustments this admission: -  Microbiology results: pending  Thank you for allowing pharmacy to be a part of this patient's care.  Nani Skillern Crowford 05/17/2017 4:38 AM

## 2017-05-17 NOTE — Progress Notes (Signed)
PULMONARY / CRITICAL CARE MEDICINE   Name: Sydney Blair MRN: [id number] DOB: 01-Jun-1956    ADMISSION DATE:  05/16/2017  CHIEF COMPLAINT: acute mental status change following N&V  Brief  61 year old woman actively undergoing treatment for squamous cell carcinoma of the lung presented to the emergency department by ambulance with complaints of vomiting and diarrhea. Throughout her emergency department stay and early after admission he developed restlessness and disorientation which eventually progressed to obtundation and she was intubated. Her ED course is notable for findings of elevated BUN/creatinine ratio, metabolic alkalosis, acute hypercapnic respiratory failure, profoundly elevated LFTs and an elevated troponin. Head CT and CT chest are largely unrevealing. Treatment course included IV fluids, broad-spectrum antibiotics, heparin drip.   SUBJECTIVE/interval:  Admitted to the intensive care   VITAL SIGNS: BP (!) 116/46   Pulse 93   Temp 98.1 F (36.7 C) (Oral)   Resp 19   Ht 5' 1" (1.549 m)   Wt 168 lb 14 oz (76.6 kg)   SpO2 93%   BMI 31.91 kg/m   HEMODYNAMICS:    VENTILATOR SETTINGS: Vent Mode: PRVC FiO2 (%):  [30 %-100 %] 30 % Set Rate:  [18 bmp] 18 bmp Vt Set:  [420 mL] 420 mL PEEP:  [5 cmH20] 5 cmH20 Plateau Pressure:  [15 cmH20-16 cmH20] 15 cmH20  INTAKE / OUTPUT:  Intake/Output Summary (Last 24 hours) at 05/17/17 0946 Last data filed at 05/17/17 1914  Gross per 24 hour  Intake          4073.93 ml  Output             1200 ml  Net          2873.93 ml     PHYSICAL EXAMINATION: General appearance:  61 Year old  female, well nourished sedated on vent  Eyes: anicteric sclerae, moist conjunctivae; PERRL, EOMI bilaterally. Mouth: moist membranes and no mucosal ulcerations; orally intubated Neck: Trachea midline; neck supple, no JVD Lungs/chest: diffuse rhonci, with normal respiratory effort and no intercostal retractions CV: RRR, no MRGs  Abdomen: Soft,  non-tender; no masses  Extremities: No peripheral edema Skin: Normal temperature, turgor and texture; no rash, ulcers Psych: sedated on vent  LABS:  BMET  Recent Labs Lab 05/16/17 1429 05/17/17 0020 05/17/17 0212  NA 140 141 140  K 3.1* 2.8* 2.9*  CL 95* 102 101  CO2 _0 BUN _1 CREATININE 0.76 0.68 0.64  GLUCOSE 81 91 82    Electrolytes  Recent Labs Lab 05/16/17 1429 05/17/17 0020 05/17/17 0212  CALCIUM 7.6* 6.9* 6.7*  MG  --  0.9*  1.0* 0.9*  PHOS  --  3.6 3.3    CBC  Recent Labs Lab 05/16/17 1429 05/17/17 0020 05/17/17 0212  WBC 9.3 7.9 7.0  HGB 9.0* 7.7* 7.3*  HCT 27.9* 24.0* 22.8*  PLT 121* 93* 92*    Coag's  Recent Labs Lab 05/16/17 1653 05/17/17 0212  APTT 28  --   INR 1.19 1.35    Sepsis Markers  Recent Labs Lab 05/16/17 1441 05/17/17 0020 05/17/17 0212  LATICACIDVEN 0.97 0.8 0.7  PROCALCITON  --  1.46 1.33    ABG  Recent Labs Lab 05/16/17 2148  PHART 7.201*  PCO2ART 76.4*  PO2ART 324*    Liver Enzymes  Recent Labs Lab 05/16/17 1429 05/17/17 0020  AST 854* 450*  ALT 594* 432*  ALKPHOS 106 88  BILITOT 1.3* 0.8  ALBUMIN 3.1* 2.5*    Cardiac  Enzymes  Recent Labs Lab 05/16/17 1733 05/17/17 0020 05/17/17 0212  TROPONINI 0.36* 0.40* 0.36*    Glucose  Recent Labs Lab 05/17/17 0805  GLUCAP 111*    Imaging Ct Head Wo Contrast  Result Date: 05/16/2017 CLINICAL DATA:  Altered mental status EXAM: CT HEAD WITHOUT CONTRAST TECHNIQUE: Contiguous axial images were obtained from the base of the skull through the vertex without intravenous contrast. COMPARISON:  Brain MRI 03/18/2017 FINDINGS: Brain: No mass lesion, intraparenchymal hemorrhage or extra-axial collection. No evidence of acute cortical infarct. Hypoattenuation in the right frontal white matter likely corresponds to prior infarct. There is periventricular hypoattenuation compatible with chronic microvascular disease. Vascular: No hyperdense  vessel or unexpected calcification. Skull: Normal visualized skull base, calvarium and extracranial soft tissues. 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 Sinuses/Orbits: No sinus fluid levels or advanced mucosal thickening. No mastoid effusion. Normal orbits. IMPRESSION: 1. No acute intracranial abnormality. 2. 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 Electronically Signed   By: Ulyses Jarred M.D.   On: 05/16/2017 22:57   Ct Abdomen Pelvis W Contrast  Result Date: 05/16/2017 CLINICAL DATA:  Diarrhea. Elevated liver enzymes. Lung cancer patient with ongoing chemotherapy. EXAM: CT ABDOMEN AND PELVIS WITH CONTRAST TECHNIQUE: Multidetector CT imaging of the abdomen and pelvis was performed using the standard protocol following bolus administration of intravenous contrast. CONTRAST:  100 cc Isovue-300 IV COMPARISON:  PET CT 02/11/2017 FINDINGS: Intubated patient, motion artifact despite sedation. Lower chest: Breathing motion artifact. Mild dependent atelectasis and possible trace pleural effusions. Hepatobiliary: No focal hepatic lesion. Mild gallbladder distention without calcified gallstone. No gross biliary dilatation, motion obscures detailed evaluation. Pancreas: No ductal dilatation. No gross peripancreatic inflammation, motion limited exam. Spleen: Normal in size without focal abnormality. Adrenals/Urinary Tract: No adrenal nodule. Left hydronephrosis and dilatation of the renal pelvis, as seen on prior PET. No obstructing stone. The ureter appears decompressed. Slight diminished left renal enhancement and excretion. No right hydronephrosis. Calcification in the mid upper right kidney appears vascular. Urinary bladder is distended without wall thickening. Stomach/Bowel: Enteric tube in place with tip in the duodenum. Stomach physiologically distended with gastric contents. No gastric wall thickening. Bowel evaluation limited by motion artifact. Liquid stool in the ascending colon without  colonic wall thickening. Air-filled nondilated descending and sigmoid colon. No evidence of colonic inflammatory change. Appendix appears normal. Vascular/Lymphatic: Aortic atherosclerosis without aneurysm. Limited assessment for adenopathy given motion. No bulky adenopathy is seen. Reproductive: 6 cm uterine fibroid causing leftward uterine deviation, similar to prior PET. No adnexal mass, ovaries tentatively identified and quiescent. Other: No evidence of free air allowing for motion. Small amount of free fluid in the pelvis, nonspecific. Possible trace perihepatic ascites. Musculoskeletal: Anterolisthesis of L5 on S1 with degenerative disc disease and facet arthropathy. Milder degenerative change throughout the remainder of the lower thoracic and lumbar spine. No evidence of focal bone lesion. Remote right rib fractures. IMPRESSION: 1. No evidence of colonic inflammation to suggest colitis. Minimal liquid stool in the ascending colon which can be seen with diarrheal process. Motion limits detailed assessment despite sedation. 2. Mild gallbladder distention without calcified gallstone. 3. 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 4. Distended urinary bladder without wall thickening. 5. Uterine fibroid. 6.  Aortic Atherosclerosis (ICD10-I70.0). Electronically Signed   By: Jeb Levering M.D.   On: 05/16/2017 23:01   Dg Chest Port 1 View  Result Date: 05/17/2017 CLINICAL DATA:  Endotracheal tube positioning EXAM: PORTABLE CHEST 1 VIEW COMPARISON:  Yesterday FINDINGS: Endotracheal  tube tip is 1 cm above the carina. An orogastric tube reaches the stomach. Right upper lobe airspace disease is mildly improved from yesterday. Worsening aeration at the bases with indistinct diaphragm. This is likely atelectasis. Differences in patient positioning could contribute. No effusion or pneumothorax. Normal heart size and mediastinal contours. IMPRESSION: 1. Lower endotracheal tube, tip 1 cm above  the carina. 2. Mild improvement in right upper lobe pneumonia. 3. Worsening aeration at the bases favoring atelectasis. Electronically Signed   By: Monte Fantasia M.D.   On: 05/17/2017 07:23   Dg Chest Portable 1 View  Result Date: 05/16/2017 CLINICAL DATA:  Intubation and gastric tube placement. Patient with lung cancer on chemoradiation therapy. EXAM: PORTABLE CHEST 1 VIEW COMPARISON:  CXR from 1507 hours FINDINGS: The heart size and mediastinal contours are within normal limits. Right upper lobe opacities are again visualized with differential considerations that may also include post treatment change, pneumonia and/or atelectasis among some possibilities though not exclusive. Mild fullness of the right hilum as before. The tip of an endotracheal tube is 3.5 cm above the carina in satisfactory position. A gastric tube is seen extending below the left hemidiaphragm in the expected location of the stomach with the tip is excluded on current exam. Heart is borderline enlarged. Minimal aortic atherosclerosis at the arch. IMPRESSION: 1. Satisfactory support line and tube positions to the extent visualized. 2. Patchy airspace opacities in the right upper lobe persists without change. Electronically Signed   By: Ashley Royalty M.D.   On: 05/16/2017 21:51   Dg Chest Port 1 View  Result Date: 05/16/2017 CLINICAL DATA:  61 year old female with weakness, tachycardia and diarrhea. Patient with lung cancer on chemoradiation therapy. EXAM: PORTABLE CHEST 1 VIEW COMPARISON:  02/11/2017 head CT. 12/08/2016 and prior chest radiographs FINDINGS: The cardiomediastinal silhouette is unchanged with right hilar fullness. Increased opacity within the peripheral right upper lobe noted. The left lung is clear. No pleural effusion or pneumothorax identified. The right upper lobe mass identified on 12/08/2016 is much less conspicuous. IMPRESSION: Increased opacity within the peripheral right upper lobe which may be related to  treatment changes/atelectasis, but pneumonia/ pneumonitis or less likely metastatic disease is not excluded. Electronically Signed   By: Margarette Canada M.D.   On: 05/16/2017 15:23     STUDIES:  Bedside ultrasound shows grossly normal cardiac function, no pericardial effusion, moderate diffuse amount of B-lines, no significant pleural effusions, IVC was some respirophasic changes but not collapsing.  CULTURES: Sputum 9/24>>> Blood 9/24>>> Urine 9/24>>>  ANTIBIOTICS: Levofloxacin 1 dose 9/24 Aztreonam 9/24>>> Vancomycin 9/24>>> Metronidazole 9/24>>>  SIGNIFICANT EVENTS: 9/24 intubated 9/24 admitted to ICU  LINES/TUBES:   DISCUSSION: Delirium in setting of dehydration, lyte imbalance (due to about week h/o N/V/D) +/- PNA.  Elevated LFTs-->DILI vs shock liver Acute hypercarbic resp failure d/t ativan For today: abx Correct lytes Cards eval for CE bump Change PAD protocol to precedex   ASSESSMENT / PLAN:   Encephalopathy possible toxic or metabolic-->also consider change in ER r/t mix of hypoxia c/b sedation (favor this being culprit for requiring intubation)  Plan:  Check ammonia  Change sedating meds to precedex w/ PRN fent.  RASS goal -2  Correct all electrolytes Cont supportive care   Acute hypercapnic respiratory failure R/o  Post obstructive RUL PNA  H/o COPD H/o lung  cancer (squamous cell stage III) PCXR personally reviewed: 9/25 RUL aeration improved. RLL w/ atx. ETT OK Plan Cont full vent support  F/u abg Scheduled BDs and  ICS (hold off on systemic steroids) F/u sputum culture  PCT algo  Cont PNA coverage (day 2 azactam and vanc) PAD protocol as above  Repeat CXR in am; if MS allows assess for SBT on 9/26   Elevated troponin concern for NSTEMI vs demand ischemia in setting of sepsis vs dehydration  ->trop I trending down  Plan Heparin gtt  F/u ECHO Repeat AM 12-lead Repeat 1 more trop I to ensure trending down   Acute kidney injury Anion gap  acidosis w/ concomitant metabolic alkalosis->improved.    Hypokalemia, hypomagnesemia  Plan Replace K and Mg; then recheck Cont IVFs  Dc lactic acid trending    Nausea vomiting diarrhea & diarrhea  Abnormal LFTs. ? Acute Drug associated liver injury vs hypoperfusion related? Also consider autoimmune or viral hepatitis.  LFTs->trending down rapidly. Doubt obstruction w/ nml ALK PO4 and nothing identified via CT scan. She had normal LFTs in august  Plan NPO PPI for SUP F/u autoimmune labs F/u viral studies F/u cdiff and GI PCR panel  IV flagyl day 2.   Anemia and thrombocytopenia  Plan Trend for s/sx bleeding while on heparin gtt Serial cbc Transfuse per protocol     FAMILY  - Updates:   - Inter-disciplinary family meet or Palliative Care meeting due by:  05/24/17   \my cct 49 minutes.   Erick Colace ACNP-BC [place] Pager # [phone] OR # [phone] if no answer    05/17/2017, 9:43 AM

## 2017-05-17 NOTE — Progress Notes (Signed)
CRITICAL VALUE ALERT  Critical Value:  Mg 0.9, Troponin 0.40  Date & Time Notied:  05/17/17  Provider Notified: yes  Orders Received/Actions taken Orders to replace: K+ & Mg+

## 2017-05-17 NOTE — Progress Notes (Signed)
Tracheal Aspirate obtained/labelled/sent to lab. 

## 2017-05-17 NOTE — Progress Notes (Signed)
ANTICOAGULATION CONSULT NOTE - Follow Up Consult  Pharmacy Consult for heparin Indication: r/o ACS  Allergies  Allergen Reactions  . Penicillins Anaphylaxis and Other (See Comments)    Has patient had a PCN reaction causing immediate rash, facial/tongue/throat swelling, SOB or lightheadedness with hypotension:Yes Has patient had a PCN reaction causing severe rash involving mucus membranes or skin necrosis:Yes Has patient had a PCN reaction that required hospitalization:No Has patient had a PCN reaction occurring within the last 10 years:No If all of the above answers are "NO", then may proceed with Cephalosporin use.     Patient Measurements: Height: 5\' 1"  (154.9 cm) Weight: 168 lb 14 oz (76.6 kg) IBW/kg (Calculated) : 47.8 Heparin Dosing Weight: 64 kg  Vital Signs: Temp: 98.3 F (36.8 C) (09/25 1935) Temp Source: Oral (09/25 1935) BP: 120/57 (09/25 1940) Pulse Rate: 73 (09/25 1940)  Labs:  Recent Labs  05/16/17 1429 05/16/17 1653  05/17/17 0020 05/17/17 0212 05/17/17 0858 05/17/17 1222 05/17/17 1813  HGB 9.0*  --   --  7.7* 7.3*  --   --   --   HCT 27.9*  --   --  24.0* 22.8*  --   --   --   PLT 121*  --   --  93* 92*  --   --   --   APTT  --  28  --   --   --   --   --   --   LABPROT  --  15.0  --   --  16.6*  --   --   --   INR  --  1.19  --   --  1.35  --   --   --   HEPARINUNFRC  --   --   --   --   --  <0.10*  --  <0.10*  CREATININE 0.76  --   --  0.68 0.64  --   --   --   CKTOTAL  --   --   --  127  --   --   --   --   CKMB  --   --   --  5.7*  --   --   --   --   TROPONINI  --   --   < > 0.40* 0.36*  --  0.23*  --   < > = values in this interval not displayed.  Estimated Creatinine Clearance: 70.9 mL/min (by C-G formula based on SCr of 0.64 mg/dL).   Assessment: Patient's a 61 y.o F with hx lung cancer on undergoing chemotherapy treatment presented to the ED on 05/16/17 with c/o weakness and diarrhea.  Heparin drip started on 9/24 for elevated  troponin.  Today, 05/17/2017: - heparin level  < 0.1 x 2.  Spoke with RN who confirmed no interruptions in therapy  Goal of Therapy:  Heparin level 0.3-0.7 units/ml Monitor platelets by anticoagulation protocol: Yes   Plan:  - repeat heparin bolus 2000 units IV x 1 and increase rate to 1100 units/hr - check 6 hr heparin level  Birdena Kingma, Toribio Harbour , PharmD 05/17/2017,7:56 PM

## 2017-05-17 NOTE — Progress Notes (Signed)
Pt. placed back to full support from pressure mode placed by MD prior to my arrival due to asynchronous breathing while staff placed Foley catheter, Vt's ranging from 65cc-120cc with breath stacking occurring, placed to previous setting, no complications, pt. tolerated well, RT to monitor.

## 2017-05-17 NOTE — Progress Notes (Signed)
Initial Nutrition Assessment  DOCUMENTATION CODES:   Obesity unspecified  INTERVENTION:  - Will follow-up tomorrow and provide TF recommendations at that time, dependent on K and Mg.   NUTRITION DIAGNOSIS:   Inadequate oral intake related to inability to eat as evidenced by NPO status.  GOAL:   Provide needs based on ASPEN/SCCM guidelines  MONITOR:   Vent status, Weight trends, Labs  REASON FOR ASSESSMENT:   Ventilator  ASSESSMENT:   61 year old woman actively undergoing treatment for squamous cell carcinoma of the lung presented to the emergency department by ambulance with complaints of vomiting and diarrhea. Throughout her emergency department stay and early after admission he developed restlessness and disorientation which eventually progressed to obtundation and she was intubated. Her ED course is notable for findings of elevated BUN/creatinine ratio, metabolic alkalosis, acute hypercapnic respiratory failure, profoundly elevated LFTs and an elevated troponin. Head CT and CT chest are largely unrevealing.   Pt seen for new vent. BMI indicates obesity. No family/visitors present at this time to provide PTA information. Per review of notes, pt with stage 3 squamous cell lung cancer on chemo (last: 9/10) and radiation (last: ~9/17). She was intubated in the ED yesterday at ~2:30 PM and OGT placed at that time.   Physical assessment shows no muscle and no fat wasting, mild edema noted to extremities. Per chart review, weight -9 lbs (5.1% body weight) since 02/01/17. This is not significant for 3 month time frame.   Patient is currently intubated on ventilator support MV: 10.5 L/min Temp (24hrs), Avg:98.4 F (36.9 C), Min:98.1 F (36.7 C), Max:99 F (37.2 C) Propofol: 6.5 ml/hr (172 kcal from fat) BP: 120/58 and MAP: 78  Medications reviewed; 1 g IV Ca gluconate x1 run yesterday, 100 mcg Synthroid per OGT/day, 4 g IV Mg sulfate x1 run today, 40 mg IV Protonix/day, 40 mEq KCl  per OGT x1 dose today. Labs reviewed; CBG: 111 mg/dL today, K: 2.9 mmol/L, Mg: 0.9 mg/dL, Ca: 6.7 mg/dL, LFTs elevated.   Drips: Heparin @ 900 units/hr, Propofol @ 15 mcg/kg/min, Precedex @ 0.6 mcg/kg/hr.    Diet Order:  Diet NPO time specified  Skin:  Reviewed, no issues  Last BM:  PTA/unknown  Height:   Ht Readings from Last 1 Encounters:  05/17/17 5\' 1"  (1.549 m)    Weight:   Wt Readings from Last 1 Encounters:  05/17/17 168 lb 14 oz (76.6 kg)    Ideal Body Weight:  47.73 kg  BMI:  Body mass index is 31.91 kg/m.  Estimated Nutritional Needs:   Kcal:  1072-1302 (14-17 kcal/kg)  Protein:  153 grams (2 grams/kg)  Fluid:  >/= 1.8 L/day  EDUCATION NEEDS:   No education needs identified at this time    Jarome Matin, MS, RD, LDN, CNSC Inpatient Clinical Dietitian Pager # [phone] After hours/weekend pager # [phone]

## 2017-05-17 NOTE — H&P (Signed)
PULMONARY / CRITICAL CARE MEDICINE   Name: Sydney Blair MRN: [id number] DOB: 01-07-1956    ADMISSION DATE:  05/16/2017  CHIEF COMPLAINT:   HISTORY OF PRESENT ILLNESS:   At the time my examination the patient is intubated and sedated and thusly unable to provide history of present illness, review of systems, past medical history, family history, social history, home medications, or allergies. Histories are obtained from family, medical providers, with a medical record.  61 year old woman actively undergoing treatment for squamous cell carcinoma of the lung presented to the emergency department by ambulance with complaints of vomiting and diarrhea. Throughout her emergency department stay and early after admission he developed restlessness and disorientation which eventually progressed to obtundation and she was intubated. Her ED course is notable for findings of elevated BUN/creatinine ratio, metabolic alkalosis, acute hypercapnic respiratory failure, profoundly elevated LFTs and an elevated troponin. Head CT and CT chest are largely unrevealing. Treatment course included IV fluids, broad-spectrum antibiotics, heparin drip.  PAST MEDICAL HISTORY :  She  has a past medical history of Anxiety; ARF (acute renal failure) ([place]) (08/07/2011); Arthritis; Cancer ([place]) (2005); Chronic back pain; COPD (chronic obstructive pulmonary disease) ([place]); Depression; Depression with anxiety; Dyspnea; Fibromyalgia; GERD (gastroesophageal reflux disease); Hypercholesteremia; Hypothyroidism; Incontinence; Neuropathy; Peripheral neuropathy; Sciatic pain; Sebaceous cyst; Sleep apnea; Stage III squamous cell carcinoma of right lung ([place]) (03/10/2017); and Tobacco abuse.  PAST SURGICAL HISTORY: She  has a past surgical history that includes Esophagogastroduodenoscopy (11/11/2010); Colonoscopy (11/11/2010); bladder tack; Esophagogastroduodenoscopy (egd) with esophageal dilation (N/A, 06/21/2013); Breast lumpectomy; Breast  biopsy (Right, 12/29/2016); and Video bronchoscopy with endobronchial ultrasound (N/A, 02/28/2017).  Allergies  Allergen Reactions  . Penicillins Anaphylaxis and Other (See Comments)    Has patient had a PCN reaction causing immediate rash, facial/tongue/throat swelling, SOB or lightheadedness with hypotension:Yes Has patient had a PCN reaction causing severe rash involving mucus membranes or skin necrosis:Yes Has patient had a PCN reaction that required hospitalization:No Has patient had a PCN reaction occurring within the last 10 years:No If all of the above answers are "NO", then may proceed with Cephalosporin use.     No current facility-administered medications on file prior to encounter.    Current Outpatient Prescriptions on File Prior to Encounter  Medication Sig  . albuterol (PROVENTIL HFA;VENTOLIN HFA) 108 (90 BASE) MCG/ACT inhaler Inhale 2 puffs into the lungs every 6 (six) hours as needed for wheezing or shortness of breath.  Marland Kitchen aspirin 81 MG chewable tablet Chew 81 mg by mouth daily.  . budesonide-formoterol (SYMBICORT) 160-4.5 MCG/ACT inhaler Inhale 2 puffs into the lungs daily.   Marland Kitchen CALCIUM PO Take 1 tablet by mouth daily.  . cetirizine (ZYRTEC) 10 MG tablet Take 10 mg by mouth daily.  Marland Kitchen dexlansoprazole (DEXILANT) 60 MG capsule Take 1 capsule (60 mg total) by mouth daily before breakfast.  . diclofenac sodium (VOLTAREN) 1 % GEL Apply 4 g topically 4 (four) times daily.  . DULoxetine (CYMBALTA) 60 MG capsule Take 60 mg by mouth daily.  . fluticasone (FLONASE) 50 MCG/ACT nasal spray Place 2 sprays into both nostrils daily.  Marland Kitchen gabapentin (NEURONTIN) 800 MG tablet Take 800 mg by mouth 4 (four) times daily.  Marland Kitchen levothyroxine (SYNTHROID, LEVOTHROID) 100 MCG tablet Take 100 mcg by mouth daily before breakfast. Reported on 02/03/2016  . meclizine (ANTIVERT) 25 MG tablet Take 25 mg by mouth daily.  . methocarbamol (ROBAXIN) 500 MG tablet Take 500 mg by mouth 4 (four) times daily.   .  nortriptyline (PAMELOR) 25  MG capsule Take 25 mg by mouth at bedtime.  Marland Kitchen olopatadine (PATANOL) 0.1 % ophthalmic solution Place 1 drop into both eyes 2 (two) times daily.   . ondansetron (ZOFRAN) 8 MG tablet Take 1 tablet (8 mg total) by mouth every 8 (eight) hours as needed for nausea or vomiting.  . pravastatin (PRAVACHOL) 40 MG tablet Take 40 mg by mouth every evening.  . Tapentadol HCl (NUCYNTA) 100 MG TABS Take 100 mg by mouth 2 (two) times daily.  Marland Kitchen tiotropium (SPIRIVA) 18 MCG inhalation capsule Place 1 capsule (18 mcg total) into inhaler and inhale daily.  . TRINTELLIX 10 MG TABS Take 10 mg by mouth daily.  Marland Kitchen zolpidem (AMBIEN) 10 MG tablet Take 10 mg by mouth at bedtime as needed for sleep.  . Potassium Chloride ER 20 MEQ TBCR Take 1 tablet on Monday, Wednesday, and Friday. (Patient not taking: Reported on 05/16/2017)  . prochlorperazine (COMPAZINE) 10 MG tablet Take 1 tablet (10 mg total) by mouth every 6 (six) hours as needed for nausea or vomiting. (Patient not taking: Reported on 05/16/2017)  . sucralfate (CARAFATE) 1 g tablet Take 1 tablet (1 g total) by mouth 4 (four) times daily -  with meals and at bedtime. Crush and dissolve tablet in 15 ml of water. (Patient not taking: Reported on 05/16/2017)  . Wound Dressings (SONAFINE) Apply 1 application topically 3 (three) times daily.    FAMILY HISTORY:  Her indicated that the status of her mother is unknown. She indicated that her son is deceased. She indicated that the status of her neg hx is unknown.    SOCIAL HISTORY: She  reports that she has been smoking Cigarettes.  She has a 35.00 pack-year smoking history. She has never used smokeless tobacco. She reports that she does not drink alcohol or use drugs.  REVIEW OF SYSTEMS:     SUBJECTIVE:    VITAL SIGNS: BP 140/68 (BP Location: Right Arm)   Pulse (!) 110   Temp 99 F (37.2 C) (Oral)   Resp (!) 27   Ht 5' (1.524 m)   Wt 158 lb (71.7 kg)   SpO2 100%   BMI 30.86 kg/m    HEMODYNAMICS:    VENTILATOR SETTINGS: Vent Mode: PRVC FiO2 (%):  [40 %-100 %] 40 % Set Rate:  [18 bmp] 18 bmp Vt Set:  [420 mL] 420 mL PEEP:  [5 cmH20] 5 cmH20 Plateau Pressure:  [15 cmH20-16 cmH20] 15 cmH20  INTAKE / OUTPUT: I/O last 3 completed shifts: In: 16 [IV Piggyback:1400] Out: -   PHYSICAL EXAMINATION: General:  Obese chronically ill-appearing, appears older than stated age Neuro:  Responsive to noxious stimuli, moves all extremities Cardiovascular:  Regular rate and rhythm no murmur, edema, good capillary refill Lungs:  Clear to auscultation no wheezing or rhonchi Abdomen:  Protuberant soft nontender nondistended no hepatosplenomegaly Musculoskeletal:  No red warm swollen tender deformed joints Skin:  Warm dry no rash, stage II sacral pressure ulcer present on arrival  LABS:  BMET  Recent Labs Lab 05/16/17 1429  NA 140  K 3.1*  CL 95*  CO2 31  BUN 14  CREATININE 0.76  GLUCOSE 81    Electrolytes  Recent Labs Lab 05/16/17 1429  CALCIUM 7.6*    CBC  Recent Labs Lab 05/16/17 1429  WBC 9.3  HGB 9.0*  HCT 27.9*  PLT 121*    Coag's  Recent Labs Lab 05/16/17 1653  APTT 28  INR 1.19    Sepsis Markers  Recent  Labs Lab 05/16/17 1441  LATICACIDVEN 0.97    ABG  Recent Labs Lab 05/16/17 2148  PHART 7.201*  PCO2ART 76.4*  PO2ART 324*    Liver Enzymes  Recent Labs Lab 05/16/17 1429  AST 854*  ALT 594*  ALKPHOS 106  BILITOT 1.3*  ALBUMIN 3.1*    Cardiac Enzymes  Recent Labs Lab 05/16/17 1733  TROPONINI 0.36*    Glucose No results for input(s): GLUCAP in the last 168 hours.  Imaging Ct Head Wo Contrast  Result Date: 05/16/2017 CLINICAL DATA:  Altered mental status EXAM: CT HEAD WITHOUT CONTRAST TECHNIQUE: Contiguous axial images were obtained from the base of the skull through the vertex without intravenous contrast. COMPARISON:  Brain MRI 03/18/2017 FINDINGS: Brain: No mass lesion, intraparenchymal  hemorrhage or extra-axial collection. No evidence of acute cortical infarct. Hypoattenuation in the right frontal white matter likely corresponds to prior infarct. There is periventricular hypoattenuation compatible with chronic microvascular disease. Vascular: No hyperdense vessel or unexpected calcification. Skull: Normal visualized skull base, calvarium and extracranial soft tissues. 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 Sinuses/Orbits: No sinus fluid levels or advanced mucosal thickening. No mastoid effusion. Normal orbits. IMPRESSION: 1. No acute intracranial abnormality. 2. 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 Electronically Signed   By: Ulyses Jarred M.D.   On: 05/16/2017 22:57   Ct Abdomen Pelvis W Contrast  Result Date: 05/16/2017 CLINICAL DATA:  Diarrhea. Elevated liver enzymes. Lung cancer patient with ongoing chemotherapy. EXAM: CT ABDOMEN AND PELVIS WITH CONTRAST TECHNIQUE: Multidetector CT imaging of the abdomen and pelvis was performed using the standard protocol following bolus administration of intravenous contrast. CONTRAST:  100 cc Isovue-300 IV COMPARISON:  PET CT 02/11/2017 FINDINGS: Intubated patient, motion artifact despite sedation. Lower chest: Breathing motion artifact. Mild dependent atelectasis and possible trace pleural effusions. Hepatobiliary: No focal hepatic lesion. Mild gallbladder distention without calcified gallstone. No gross biliary dilatation, motion obscures detailed evaluation. Pancreas: No ductal dilatation. No gross peripancreatic inflammation, motion limited exam. Spleen: Normal in size without focal abnormality. Adrenals/Urinary Tract: No adrenal nodule. Left hydronephrosis and dilatation of the renal pelvis, as seen on prior PET. No obstructing stone. The ureter appears decompressed. Slight diminished left renal enhancement and excretion. No right hydronephrosis. Calcification in the mid upper right kidney appears vascular. Urinary bladder  is distended without wall thickening. Stomach/Bowel: Enteric tube in place with tip in the duodenum. Stomach physiologically distended with gastric contents. No gastric wall thickening. Bowel evaluation limited by motion artifact. Liquid stool in the ascending colon without colonic wall thickening. Air-filled nondilated descending and sigmoid colon. No evidence of colonic inflammatory change. Appendix appears normal. Vascular/Lymphatic: Aortic atherosclerosis without aneurysm. Limited assessment for adenopathy given motion. No bulky adenopathy is seen. Reproductive: 6 cm uterine fibroid causing leftward uterine deviation, similar to prior PET. No adnexal mass, ovaries tentatively identified and quiescent. Other: No evidence of free air allowing for motion. Small amount of free fluid in the pelvis, nonspecific. Possible trace perihepatic ascites. Musculoskeletal: Anterolisthesis of L5 on S1 with degenerative disc disease and facet arthropathy. Milder degenerative change throughout the remainder of the lower thoracic and lumbar spine. No evidence of focal bone lesion. Remote right rib fractures. IMPRESSION: 1. No evidence of colonic inflammation to suggest colitis. Minimal liquid stool in the ascending colon which can be seen with diarrheal process. Motion limits detailed assessment despite sedation. 2. Mild gallbladder distention without calcified gallstone. 3. 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 4. Distended urinary bladder without wall thickening.  5. Uterine fibroid. 6.  Aortic Atherosclerosis (ICD10-I70.0). Electronically Signed   By: Jeb Levering M.D.   On: 05/16/2017 23:01   Dg Chest Portable 1 View  Result Date: 05/16/2017 CLINICAL DATA:  Intubation and gastric tube placement. Patient with lung cancer on chemoradiation therapy. EXAM: PORTABLE CHEST 1 VIEW COMPARISON:  CXR from 1507 hours FINDINGS: The heart size and mediastinal contours are within normal limits. Right  upper lobe opacities are again visualized with differential considerations that may also include post treatment change, pneumonia and/or atelectasis among some possibilities though not exclusive. Mild fullness of the right hilum as before. The tip of an endotracheal tube is 3.5 cm above the carina in satisfactory position. A gastric tube is seen extending below the left hemidiaphragm in the expected location of the stomach with the tip is excluded on current exam. Heart is borderline enlarged. Minimal aortic atherosclerosis at the arch. IMPRESSION: 1. Satisfactory support line and tube positions to the extent visualized. 2. Patchy airspace opacities in the right upper lobe persists without change. Electronically Signed   By: Ashley Royalty M.D.   On: 05/16/2017 21:51   Dg Chest Port 1 View  Result Date: 05/16/2017 CLINICAL DATA:  61 year old female with weakness, tachycardia and diarrhea. Patient with lung cancer on chemoradiation therapy. EXAM: PORTABLE CHEST 1 VIEW COMPARISON:  02/11/2017 head CT. 12/08/2016 and prior chest radiographs FINDINGS: The cardiomediastinal silhouette is unchanged with right hilar fullness. Increased opacity within the peripheral right upper lobe noted. The left lung is clear. No pleural effusion or pneumothorax identified. The right upper lobe mass identified on 12/08/2016 is much less conspicuous. IMPRESSION: Increased opacity within the peripheral right upper lobe which may be related to treatment changes/atelectasis, but pneumonia/ pneumonitis or less likely metastatic disease is not excluded. Electronically Signed   By: Margarette Canada M.D.   On: 05/16/2017 15:23     STUDIES:  Bedside ultrasound shows grossly normal cardiac function, no pericardial effusion, moderate diffuse amount of B-lines, no significant pleural effusions, IVC was some respirophasic changes but not collapsing.  CULTURES: Sputum 9/24 Blood 9/24 Urine 9/24  ANTIBIOTICS: Levofloxacin 1 dose  9/24 Aztreonam 9/24 Vancomycin 9/24 Metronidazole 9/24  SIGNIFICANT EVENTS: 9/24 intubated 9/24 admitted to ICU  LINES/TUBES:   DISCUSSION: It Is unclear what the etiology of all her symptoms are. I would suppose current chemoradiation therapy is related. Certainly I suspect her pulmonary infiltrates may be radiation induced. I also service suspect that her nausea vomiting and diarrhea is related to her chemotherapy considering her CT abdomen and pelvis was unremarkable. Her profoundly elevated LFTs are harder to explain and while these could be secondary to chemotherapy and acute viral hepatitis is also possible but it increased ALT greater than AST would be expected. Aside from DILI or viral hepatitis other causes of hepatitis such as nonalcoholic fatty liver disease or autoimmune or possible.  Biliary seems less likely given CT findings and normal alkaline phosphatase.  She is obviously immunosuppressed due to malignancy and chemoradiation and atypical presentation of sepsis is possible.   ASSESSMENT / PLAN:  PULMONARY A: Acute hypercapnic respiratory failure P:   Mechanical ventilation Daily awakening and breathing Ventilator associated pneumonia prophylaxis Nebs  CARDIOVASCULAR A:  Elevated troponin concern for NSTEMI P:  Serial troponin Serial EKG Echo Heparin drip Daily aspirin DuetoelevatedLFTs  RENAL A:   Acute kidney injury Hypokalemia Metabolic alkalosis Anion gap acidosis P:   Judicious fluid resuscitation Trend lactate Monitor anion gap  GASTROINTESTINAL A:   Nausea vomiting  diarrhea P:   Nothing by mouth Continue with home PPI  HEMATOLOGIC A:   DVT prophylaxis P:  On heparin drip  INFECTIOUS A:   Rule out sepsis P:   Vancomycin, aztreonam, metronidazole Follow-up cultures  ENDOCRINE A:   Normal glucose was ketonuria   P:   Small amount dextrose containing fluids   NEUROLOGIC A:   Encephalopathy possible toxic or metabolic P:    RASS goal: 0 Fentanyl drip for pain Propofol drip for sedation   FAMILY  - Updates:   - Inter-disciplinary family meet or Palliative Care meeting due by:  05/24/17   Upon my evaluation, this patient had a high probability of imminent or life-threatening deterioration due to impairments of multiple systems including respiratory, renal, CNS, cardiac  high complexity decision making to assess, manipulate, and support vital organ system failure including mechanical ventilation  I have personally provided 70 minutes of critical care time exclusive of time spent on separately billable procedures and education. Time includes review and summation of previous medical record, laboratory data, radiology results, independent review of chest x-ray, performance of bedside ultrasound, and monitoring for potential decompensation. Interventions were performed as documented above.  Condition: Critical Prognosis: Guarded Code Status: Full  Charlesetta Garibaldi, MD  Critical Care Medicine [facility] Pager: [phone]  05/17/2017, 12:33 AM

## 2017-05-17 NOTE — Progress Notes (Signed)
[facility] Progress Note Patient Name: Sydney Blair DOB: 1956/07/07 MRN: [id number]   Date of Service  05/17/2017  HPI/Events of Note  K+ = 2.8, Mg++ = 0.9 and Crsatinine = 0.68.   eICU Interventions  Will replace K+ and Mg++.     Intervention Category Major Interventions: Electrolyte abnormality - evaluation and management  Sommer,Steven Eugene 05/17/2017, 2:38 AM

## 2017-05-18 ENCOUNTER — Inpatient Hospital Stay (HOSPITAL_COMMUNITY): Payer: Medicaid Other

## 2017-05-18 DIAGNOSIS — J9602 Acute respiratory failure with hypercapnia: Secondary | ICD-10-CM

## 2017-05-18 DIAGNOSIS — L8990 Pressure ulcer of unspecified site, unspecified stage: Secondary | ICD-10-CM | POA: Insufficient documentation

## 2017-05-18 LAB — COMPREHENSIVE METABOLIC PANEL
ALK PHOS: 71 U/L (ref 38–126)
ALT: 299 U/L — AB (ref 14–54)
ALT: 245 U/L — ABNORMAL HIGH (ref 14–54)
ANION GAP: 9 (ref 5–15)
ANION GAP: 6 (ref 5–15)
AST: 141 U/L — ABNORMAL HIGH (ref 15–41)
AST: 67 U/L — ABNORMAL HIGH (ref 15–41)
Albumin: 2.4 g/dL — ABNORMAL LOW (ref 3.5–5.0)
Albumin: 2.5 g/dL — ABNORMAL LOW (ref 3.5–5.0)
Alkaline Phosphatase: 86 U/L (ref 38–126)
BUN: 7 mg/dL (ref 6–20)
BUN: <5 mg/dL — ABNORMAL LOW (ref 6–20)
CALCIUM: 7.3 mg/dL — AB (ref 8.9–10.3)
CALCIUM: 7.9 mg/dL — AB (ref 8.9–10.3)
CHLORIDE: 103 mmol/L (ref 101–111)
CO2: 27 mmol/L (ref 22–32)
CO2: 31 mmol/L (ref 22–32)
CREATININE: 0.52 mg/dL (ref 0.44–1.00)
Chloride: 103 mmol/L (ref 101–111)
Creatinine, Ser: 0.5 mg/dL (ref 0.44–1.00)
GFR calc non Af Amer: >60 mL/min (ref >60)
Glucose, Bld: 129 mg/dL — ABNORMAL HIGH (ref 65–99)
Glucose, Bld: 90 mg/dL (ref 65–99)
POTASSIUM: 3.9 mmol/L (ref 3.5–5.1)
Potassium: 3.3 mmol/L — ABNORMAL LOW (ref 3.5–5.1)
SODIUM: 139 mmol/L (ref 135–145)
Sodium: 140 mmol/L (ref 135–145)
TOTAL PROTEIN: 5.5 g/dL — AB (ref 6.5–8.1)
Total Bilirubin: 0.5 mg/dL (ref 0.3–1.2)
Total Bilirubin: 0.6 mg/dL (ref 0.3–1.2)
Total Protein: 6.1 g/dL — ABNORMAL LOW (ref 6.5–8.1)

## 2017-05-18 LAB — CALCIUM, IONIZED: CALCIUM, IONIZED, SERUM: 3.7 mg/dL — AB (ref 4.5–5.6)

## 2017-05-18 LAB — PROCALCITONIN: Procalcitonin: 0.6 ng/mL

## 2017-05-18 LAB — HEPATITIS PANEL, ACUTE
HEP A IGM: NEGATIVE
HEP B S AG: NEGATIVE
Hep B C IgM: NEGATIVE

## 2017-05-18 LAB — BLOOD GAS, ARTERIAL
ACID-BASE EXCESS: 4.5 mmol/L — AB (ref 0.0–2.0)
Bicarbonate: 29.6 mmol/L — ABNORMAL HIGH (ref 20.0–28.0)
DRAWN BY: 225631
FIO2: 40
O2 SAT: 98.6 %
PATIENT TEMPERATURE: 98.3
PCO2 ART: 50.2 mmHg — AB (ref 32.0–48.0)
PEEP/CPAP: 5 cmH2O
PH ART: 7.388 (ref 7.350–7.450)
RATE: 18 resp/min
VT: 0.42 mL
pO2, Arterial: 125 mmHg — ABNORMAL HIGH (ref 83.0–108.0)

## 2017-05-18 LAB — CBC
HCT: 24.5 % — ABNORMAL LOW (ref 36.0–46.0)
HEMOGLOBIN: 7.7 g/dL — AB (ref 12.0–15.0)
MCH: 28.7 pg (ref 26.0–34.0)
MCHC: 31.4 g/dL (ref 30.0–36.0)
MCV: 91.4 fL (ref 78.0–100.0)
PLATELETS: 90 10*3/uL — AB (ref 150–400)
RBC: 2.68 MIL/uL — AB (ref 3.87–5.11)
RDW: 25.1 % — ABNORMAL HIGH (ref 11.5–15.5)
WBC: 4.8 10*3/uL (ref 4.0–10.5)

## 2017-05-18 LAB — ANTI-SMOOTH MUSCLE ANTIBODY, IGG: F-ACTIN AB IGG: 9 U (ref 0–19)

## 2017-05-18 LAB — HEPARIN LEVEL (UNFRACTIONATED)
HEPARIN UNFRACTIONATED: 0.2 [IU]/mL — AB (ref 0.30–0.70)
Heparin Unfractionated: 0.15 IU/mL — ABNORMAL LOW (ref 0.30–0.70)

## 2017-05-18 LAB — MAGNESIUM: Magnesium: 1.5 mg/dL — ABNORMAL LOW (ref 1.7–2.4)

## 2017-05-18 LAB — URINE CULTURE: Culture: NO GROWTH

## 2017-05-18 LAB — CMV IGM

## 2017-05-18 LAB — ANA W/REFLEX IF POSITIVE: ANA: NEGATIVE

## 2017-05-18 LAB — STREP PNEUMONIAE URINARY ANTIGEN: STREP PNEUMO URINARY ANTIGEN: NEGATIVE

## 2017-05-18 LAB — HAPTOGLOBIN: Haptoglobin: 253 mg/dL — ABNORMAL HIGH (ref 34–200)

## 2017-05-18 LAB — PROTIME-INR
INR: 1.09
Prothrombin Time: 14.1 seconds (ref 11.4–15.2)

## 2017-05-18 LAB — GLUCOSE, CAPILLARY: GLUCOSE-CAPILLARY: 130 mg/dL — AB (ref 65–99)

## 2017-05-18 LAB — CMV ANTIBODY, IGG (EIA)

## 2017-05-18 LAB — HIV ANTIBODY (ROUTINE TESTING W REFLEX): HIV Screen 4th Generation wRfx: NONREACTIVE

## 2017-05-18 MED ORDER — PRAVASTATIN SODIUM 20 MG PO TABS
40.0000 mg | ORAL_TABLET | Freq: Every day | ORAL | Status: DC
  Administered 2017-05-18 – 2017-05-19 (×3): 40 mg via ORAL
  Filled 2017-05-18 (×2): qty 2

## 2017-05-18 MED ORDER — PANTOPRAZOLE SODIUM 40 MG PO TBEC
40.0000 mg | DELAYED_RELEASE_TABLET | Freq: Every day | ORAL | Status: DC
  Administered 2017-05-18 – 2017-05-21 (×4): 40 mg via ORAL
  Filled 2017-05-18 (×4): qty 1

## 2017-05-18 MED ORDER — ASPIRIN 81 MG PO CHEW
81.0000 mg | CHEWABLE_TABLET | Freq: Every day | ORAL | Status: DC
  Administered 2017-05-18 – 2017-05-21 (×4): 81 mg via ORAL
  Filled 2017-05-18 (×4): qty 1

## 2017-05-18 MED ORDER — MAGNESIUM SULFATE 4 GM/100ML IV SOLN
4.0000 g | Freq: Once | INTRAVENOUS | Status: AC
  Administered 2017-05-18: 4 g via INTRAVENOUS
  Filled 2017-05-18: qty 100

## 2017-05-18 MED ORDER — POTASSIUM CHLORIDE 10 MEQ/100ML IV SOLN
10.0000 meq | INTRAVENOUS | Status: AC
  Administered 2017-05-18 (×6): 10 meq via INTRAVENOUS
  Filled 2017-05-18 (×2): qty 100

## 2017-05-18 MED ORDER — DULOXETINE HCL 30 MG PO CPEP
60.0000 mg | ORAL_CAPSULE | Freq: Every day | ORAL | Status: DC
  Administered 2017-05-18 – 2017-05-19 (×2): 60 mg via ORAL
  Filled 2017-05-18 (×3): qty 2

## 2017-05-18 MED ORDER — GABAPENTIN 400 MG PO CAPS
400.0000 mg | ORAL_CAPSULE | Freq: Three times a day (TID) | ORAL | Status: DC
  Administered 2017-05-18 – 2017-05-21 (×10): 400 mg via ORAL
  Filled 2017-05-18 (×10): qty 1

## 2017-05-18 MED ORDER — METOPROLOL TARTRATE 25 MG PO TABS
25.0000 mg | ORAL_TABLET | Freq: Three times a day (TID) | ORAL | Status: DC
  Administered 2017-05-18 – 2017-05-19 (×3): 25 mg via ORAL
  Filled 2017-05-18 (×3): qty 1

## 2017-05-18 MED ORDER — HEPARIN (PORCINE) IN NACL 100-0.45 UNIT/ML-% IJ SOLN
1700.0000 [IU]/h | INTRAMUSCULAR | Status: DC
  Administered 2017-05-18: 1550 [IU]/h via INTRAVENOUS
  Administered 2017-05-19: 1700 [IU]/h via INTRAVENOUS
  Filled 2017-05-18 (×2): qty 250

## 2017-05-18 NOTE — Progress Notes (Signed)
 Progress Note  Patient Name: Sydney Blair Date of Encounter: 05/18/2017  Primary Cardiologist: Dr.   Subjective   Pt self-extubated ET tube this morning, maintaining on [place]. Answers yes/no questions, denies chest pain.  Inpatient Medications    Scheduled Meds: . aspirin  81 mg Per Tube Daily  . budesonide (PULMICORT) nebulizer solution  0.5 mg Nebulization BID  . chlorhexidine gluconate (MEDLINE KIT)  15 mL Mouth Rinse BID  . ipratropium-albuterol  3 mL Nebulization Q6H  . levothyroxine  100 mcg Oral QAC breakfast  . mouth rinse  15 mL Mouth Rinse QID  . olopatadine  1 drop Both Eyes BID  . pantoprazole (PROTONIX) IV  40 mg Intravenous QHS   Continuous Infusions: . aztreonam 2 g (05/18/17 0624)  . dexmedetomidine (PRECEDEX) IV infusion 1.2 mcg/kg/hr (05/18/17 0600)  . heparin 1,350 Units/hr (05/18/17 0402)  . metronidazole Stopped (05/18/17 0112)  . vancomycin Stopped (05/18/17 0602)   PRN Meds: albuterol, fentaNYL (SUBLIMAZE) injection, fentaNYL (SUBLIMAZE) injection   Vital Signs    Vitals:   05/18/17 0400 05/18/17 0500 05/18/17 0600 05/18/17 0700  BP: (!) 129/59 (!) 157/79 131/62 136/60  Pulse: 73 96 68 72  Resp: (!) 21 20 19 20  Temp:      TempSrc:      SpO2: 99% 100% 97% 97%  Weight:  167 lb 5.3 oz (75.9 kg)    Height:        Intake/Output Summary (Last 24 hours) at 05/18/17 0743 Last data filed at 05/18/17 0600  Gross per 24 hour  Intake           1857.2 ml  Output             1200 ml  Net            657.2 ml   Filed Weights   05/17/17 0000 05/17/17 0339 05/18/17 0500  Weight: 167 lb 8.8 oz (76 kg) 168 lb 14 oz (76.6 kg) 167 lb 5.3 oz (75.9 kg)     Physical Exam   General: Well developed, well nourished, female appearing in no acute distress. Head: Normocephalic, atraumatic.  Neck: Supple without bruits, no JVD Lungs:  Resp regular and unlabored, wheezes and rhonchi throughout Heart: RRR, S1, S2, no S3, S4, or murmur; no  rub. Abdomen: Soft, non-tender, non-distended with normoactive bowel sounds. No hepatomegaly. No rebound/guarding. No obvious abdominal masses. Extremities: No clubbing, cyanosis, trace edema. Distal pedal pulses are 1+ bilaterally. Neuro: Alert and oriented X 3. Moves all extremities spontaneously. Psych: Normal affect.  Labs    Chemistry Recent Labs Lab 05/16/17 1429 05/17/17 0020 05/17/17 0212 05/18/17 0223  NA 140 141 140 139  K 3.1* 2.8* 2.9* 3.3*  CL 95* 102 101 103  CO2 31 28 27 27  GLUCOSE 81 91 82 129*  BUN 14 11 11 7  CREATININE 0.76 0.68 0.64 0.52  CALCIUM 7.6* 6.9* 6.7* 7.3*  PROT 6.8 5.7*  --  5.5*  ALBUMIN 3.1* 2.5*  --  2.4*  AST 854* 450*  --  141*  ALT 594* 432*  --  299*  ALKPHOS 106 88  --  71  BILITOT 1.3* 0.8  --  0.5  GFRNONAA >60 >60 >60 >60  GFRAA >60 >60 >60 >60  ANIONGAP 14 11 12 9     Hematology Recent Labs Lab 05/17/17 0020 05/17/17 0212 05/18/17 0223  WBC 7.9 7.0 4.8  RBC 2.61* 2.47* 2.68*  HGB 7.7* 7.3* 7.7*  HCT 24.0*   22.8* 24.5*  MCV 92.0 92.3 91.4  MCH 29.5 29.6 28.7  MCHC 32.1 32.0 31.4  RDW 24.6* 24.7* 25.1*  PLT 93* 92* 90*    Cardiac Enzymes Recent Labs Lab 05/16/17 1733 05/17/17 0020 05/17/17 0212 05/17/17 1222  TROPONINI 0.36* 0.40* 0.36* 0.23*    Recent Labs Lab 05/16/17 1440  TROPIPOC 0.23*     BNPNo results for input(s): BNP, PROBNP in the last 168 hours.   DDimer No results for input(s): DDIMER in the last 168 hours.   Radiology    Ct Head Wo Contrast  Result Date: 05/16/2017 CLINICAL DATA:  Altered mental status EXAM: CT HEAD WITHOUT CONTRAST TECHNIQUE: Contiguous axial images were obtained from the base of the skull through the vertex without intravenous contrast. COMPARISON:  Brain MRI 03/18/2017 FINDINGS: Brain: No mass lesion, intraparenchymal hemorrhage or extra-axial collection. No evidence of acute cortical infarct. Hypoattenuation in the right frontal white matter likely corresponds to prior  infarct. There is periventricular hypoattenuation compatible with chronic microvascular disease. Vascular: No hyperdense vessel or unexpected calcification. Skull: Normal visualized skull base, calvarium and extracranial soft tissues. 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 Sinuses/Orbits: No sinus fluid levels or advanced mucosal thickening. No mastoid effusion. Normal orbits. IMPRESSION: 1. No acute intracranial abnormality. 2. 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 Electronically Signed   By: Ulyses Jarred M.D.   On: 05/16/2017 22:57   Ct Abdomen Pelvis W Contrast  Result Date: 05/16/2017 CLINICAL DATA:  Diarrhea. Elevated liver enzymes. Lung cancer patient with ongoing chemotherapy. EXAM: CT ABDOMEN AND PELVIS WITH CONTRAST TECHNIQUE: Multidetector CT imaging of the abdomen and pelvis was performed using the standard protocol following bolus administration of intravenous contrast. CONTRAST:  100 cc Isovue-300 IV COMPARISON:  PET CT 02/11/2017 FINDINGS: Intubated patient, motion artifact despite sedation. Lower chest: Breathing motion artifact. Mild dependent atelectasis and possible trace pleural effusions. Hepatobiliary: No focal hepatic lesion. Mild gallbladder distention without calcified gallstone. No gross biliary dilatation, motion obscures detailed evaluation. Pancreas: No ductal dilatation. No gross peripancreatic inflammation, motion limited exam. Spleen: Normal in size without focal abnormality. Adrenals/Urinary Tract: No adrenal nodule. Left hydronephrosis and dilatation of the renal pelvis, as seen on prior PET. No obstructing stone. The ureter appears decompressed. Slight diminished left renal enhancement and excretion. No right hydronephrosis. Calcification in the mid upper right kidney appears vascular. Urinary bladder is distended without wall thickening. Stomach/Bowel: Enteric tube in place with tip in the duodenum. Stomach physiologically distended with gastric  contents. No gastric wall thickening. Bowel evaluation limited by motion artifact. Liquid stool in the ascending colon without colonic wall thickening. Air-filled nondilated descending and sigmoid colon. No evidence of colonic inflammatory change. Appendix appears normal. Vascular/Lymphatic: Aortic atherosclerosis without aneurysm. Limited assessment for adenopathy given motion. No bulky adenopathy is seen. Reproductive: 6 cm uterine fibroid causing leftward uterine deviation, similar to prior PET. No adnexal mass, ovaries tentatively identified and quiescent. Other: No evidence of free air allowing for motion. Small amount of free fluid in the pelvis, nonspecific. Possible trace perihepatic ascites. Musculoskeletal: Anterolisthesis of L5 on S1 with degenerative disc disease and facet arthropathy. Milder degenerative change throughout the remainder of the lower thoracic and lumbar spine. No evidence of focal bone lesion. Remote right rib fractures. IMPRESSION: 1. No evidence of colonic inflammation to suggest colitis. Minimal liquid stool in the ascending colon which can be seen with diarrheal process. Motion limits detailed assessment despite sedation. 2. Mild gallbladder distention without calcified gallstone. 3. 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 4. Distended urinary bladder without wall thickening. 5. Uterine fibroid. 6.  Aortic Atherosclerosis (ICD10-I70.0). Electronically Signed   By: Melanie  Ehinger M.D.   On: 05/16/2017 23:01   Dg Chest Port 1 View  Result Date: 05/18/2017 CLINICAL DATA:  Hypoxia EXAM: PORTABLE CHEST 1 VIEW COMPARISON:  May 17, 2017 FINDINGS: Endotracheal tube tip is 3.1 cm above the carina. Nasogastric tube tip and side port below the diaphragm. No evident pneumothorax. There is persistent airspace opacity in the periphery of the right upper lobe, likely pneumonia. There is mild bibasilar atelectatic change. Lungs elsewhere are clear. Heart is  mildly enlarged with pulmonary vascularity within normal limits. No adenopathy. There is aortic atherosclerosis. There is an old healed fracture of the right posterior eighth rib. There are surgical clips in the right axillary region. IMPRESSION: Tube positions as described without pneumothorax. Persistent patchy airspace consolidation right upper lobe consistent with pneumonia, stable. Mild bibasilar atelectasis. Stable cardiac silhouette. There is aortic atherosclerosis. Aortic Atherosclerosis (ICD10-I70.0). Electronically Signed   By: William  Woodruff III M.D.   On: 05/18/2017 07:08   Dg Chest Port 1 View  Result Date: 05/17/2017 CLINICAL DATA:  Endotracheal tube positioning EXAM: PORTABLE CHEST 1 VIEW COMPARISON:  Yesterday FINDINGS: Endotracheal tube tip is 1 cm above the carina. An orogastric tube reaches the stomach. Right upper lobe airspace disease is mildly improved from yesterday. Worsening aeration at the bases with indistinct diaphragm. This is likely atelectasis. Differences in patient positioning could contribute. No effusion or pneumothorax. Normal heart size and mediastinal contours. IMPRESSION: 1. Lower endotracheal tube, tip 1 cm above the carina. 2. Mild improvement in right upper lobe pneumonia. 3. Worsening aeration at the bases favoring atelectasis. Electronically Signed   By: Jonathon  Watts M.D.   On: 05/17/2017 07:23   Dg Chest Portable 1 View  Result Date: 05/16/2017 CLINICAL DATA:  Intubation and gastric tube placement. Patient with lung cancer on chemoradiation therapy. EXAM: PORTABLE CHEST 1 VIEW COMPARISON:  CXR from 1507 hours FINDINGS: The heart size and mediastinal contours are within normal limits. Right upper lobe opacities are again visualized with differential considerations that may also include post treatment change, pneumonia and/or atelectasis among some possibilities though not exclusive. Mild fullness of the right hilum as before. The tip of an endotracheal tube  is 3.5 cm above the carina in satisfactory position. A gastric tube is seen extending below the left hemidiaphragm in the expected location of the stomach with the tip is excluded on current exam. Heart is borderline enlarged. Minimal aortic atherosclerosis at the arch. IMPRESSION: 1. Satisfactory support line and tube positions to the extent visualized. 2. Patchy airspace opacities in the right upper lobe persists without change. Electronically Signed   By: David  Kwon M.D.   On: 05/16/2017 21:51   Dg Chest Port 1 View  Result Date: 05/16/2017 CLINICAL DATA:  61-year-old female with weakness, tachycardia and diarrhea. Patient with lung cancer on chemoradiation therapy. EXAM: PORTABLE CHEST 1 VIEW COMPARISON:  02/11/2017 head CT. 12/08/2016 and prior chest radiographs FINDINGS: The cardiomediastinal silhouette is unchanged with right hilar fullness. Increased opacity within the peripheral right upper lobe noted. The left lung is clear. No pleural effusion or pneumothorax identified. The right upper lobe mass identified on 12/08/2016 is much less conspicuous. IMPRESSION: Increased opacity within the peripheral right upper lobe which may be related to treatment changes/atelectasis, but pneumonia/ pneumonitis or less likely metastatic disease is not excluded. Electronically Signed   By: Jeffrey    Hu M.D.   On: 05/16/2017 15:23   Us Liver Doppler  Result Date: 05/17/2017 CLINICAL DATA:  Transaminitis EXAM: DUPLEX ULTRASOUND OF LIVER TECHNIQUE: Color and duplex Doppler ultrasound was performed to evaluate the hepatic in-flow and out-flow vessels. COMPARISON:  None. FINDINGS: Portal Vein Velocities Main:  36.9 cm/sec proximal; 48.1 mid; 35.4 distal Right:  49.4 cm/sec Left:  35.7 cm/sec Flow in the portal vein and its branches is hepatopetal. Hepatic Vein Velocities Right:  38.2 cm/sec Middle:  26.6 cm/sec Left:  41.3 cm/sec. Flow in the hepatic vein and its branches is hepatofugal. Hepatic Artery Velocity:  199.9  cm/sec Splenic Vein Velocity:  26.3 cm/sec Varices: None Ascites: None. There is no splenic or portal vein thrombus or occlusion evident. Spleen measures 11.3 x 12.7 x 5.1 cm with a measured splenic volume of 385 cubic cm. IMPRESSION: The portal vein and branches, hepatic vein and branches, hepatic artery, and splenic vein are all patent with flow in the appropriate anatomic direction. No thrombus or occlusion. No varices or ascites evident. Splenic size within normal limits. Electronically Signed   By: William  Woodruff III M.D.   On: 05/17/2017 13:34   Us Abdomen Limited Ruq  Result Date: 05/17/2017 CLINICAL DATA:  Elevated liver enzymes EXAM: ULTRASOUND ABDOMEN LIMITED RIGHT UPPER QUADRANT COMPARISON:  None. FINDINGS: Gallbladder: There is sludge in the gallbladder. There are no echogenic foci in the gallbladder which move and shadow as is expected with gallstones. No gallbladder wall thickening or pericholecystic fluid. No sonographic Murphy sign noted by sonographer. Common bile duct: Diameter: 4 mm. There is no intrahepatic or extrahepatic biliary duct dilatation. Liver: No focal lesion identified. Within normal limits in parenchymal echogenicity. Portal vein is patent on color Doppler imaging with normal direction of blood flow towards the liver. IMPRESSION: Sludge in gallbladder. No gallstones or gallbladder wall thickening evident. Study otherwise unremarkable. Electronically Signed   By: William  Woodruff III M.D.   On: 05/17/2017 12:57     Telemetry    Sinus rhythm - Personally Reviewed  ECG    No new tracings - Personally Reviewed   Cardiac Studies   Echo pending  Patient Profile     61 y.o. female with a hx of COPD, Rt lung cancer, sleep apnea, hypothyroid, HLD, GERD and anxiety who is being seen for the evaluation of EKGs and elevated troponin   Assessment & Plan    1. Elevated troponin, abnormal EKG - heparin drip - likely demand ischemia in the setting of respiratory  failure - 0.36 --> 0.40 --> 0.36 --> 0.23 - pt denies chest pain - echo pending   2. Hypokalemia - improving - replace via IV per primary team - K 3.3 (2.9) - keep K close to 4.0 - repeat BMP this afternoon   3. Anemia - Hb improving, Hb 7.7 (7.3)   4. COPD - per nursing, pt self-discontinued ET tube this morning - currently on [place]   Signed, Angela Nicole Duke , PA-C 7:43 AM 05/18/2017 Pager: [phone]  History and all data above reviewed.  Patient examined.  I agree with the findings as above.  Self extubated this morning.  I am able to ask questions. She denies any chest pain at home.  She has chronic DOE.  Denies palpitations, presyncope or syncope.  Denies chest pain currently;  The patient exam reveals COR:  RRR, no murmur  ,  Lungs: clear  ,  Abd: Positive bowel sounds, no rebound no guarding, Ext no edema  .    All available labs, radiology testing, previous records reviewed. Agree with documented assessment and plan. ELEVATED TROPONIN:  Trend is flat.  Echo pending.  Possible out patient stress testing pending the results of the echo.  HTN:  BP is creeping up and HR elevated.   I don't hear any wheezing and I will start  beta blocker.       12:01 PM  05/18/2017 

## 2017-05-18 NOTE — Progress Notes (Signed)
PULMONARY / CRITICAL CARE MEDICINE   Name: Sydney Blair MRN: [id number] DOB: 04/10/1956    ADMISSION DATE:  05/16/2017  CHIEF COMPLAINT: acute mental status change following N&V  PRESENTING HPI:  61 y.o.  female currently undergoing treatment for squamous cell carcinoma the lung. Presenting with a two-week history of nausea with vomiting and diarrhea. Patient developed worsening mentation with delirium in the emergency department shortly after admission. Ultimately received IV Ativan and had worsening hypercarbic respiratory failure and with decreased mentation required endotracheal intubation.  SUBJECTIVE/interval:  No acute events overnight. Patient self extubated this morning. Respiratory status remained relatively stable post self extubation. Patient is altered and disoriented having received fentanyl earlier this morning.  REVIEW OF SYSTEMS:  Unable to obtain with altered mentation.  VITAL SIGNS: BP 136/60   Pulse 72   Temp 98.3 F (36.8 C) (Oral)   Resp 20   Ht 5\' 1"  (1.549 m)   Wt 167 lb 5.3 oz (75.9 kg)   SpO2 97%   BMI 31.62 kg/m   HEMODYNAMICS:    VENTILATOR SETTINGS: Vent Mode: PRVC FiO2 (%):  [30 %-40 %] 30 % Set Rate:  [18 bmp] 18 bmp Vt Set:  [420 mL] 420 mL PEEP:  [5 cmH20] 5 cmH20 Plateau Pressure:  [12 cmH20-15 cmH20] 14 cmH20  INTAKE / OUTPUT:  Intake/Output Summary (Last 24 hours) at 05/18/17 0756 Last data filed at 05/18/17 0600  Gross per 24 hour  Intake           1857.2 ml  Output             1200 ml  Net            657.2 ml     PHYSICAL EXAMINATION: General:  Eyes closed. Multiple staff at bedside. No acute distress. Integument:  Warm. Dry. No rash on exposed skin. Extremities:  No cyanosis.  HEENT:  Moist mucous membranes. No scleral icterus. Cardiovascular:  Regular rate. No edema. No appreciable JVD.  Pulmonary:  Normal work of breathing on nonrebreather. Able to phonate appropriately. Abdomen: Soft. Normal bowel sounds.  Nondistended. Grossly nontender. Neurological: Moving all 4 extremities equally. No meningismus. Oriented to hospital but not year or president. Very drowsy.  LABS:  BMET  Recent Labs Lab 05/17/17 0020 05/17/17 0212 05/18/17 0223  NA 141 140 139  K 2.8* 2.9* 3.3*  CL 102 101 103  CO2 28 27 27   BUN 11 11 7   CREATININE 0.68 0.64 0.52  GLUCOSE 91 82 129*    Electrolytes  Recent Labs Lab 05/17/17 0020 05/17/17 0212 05/17/17 1429 05/18/17 0223  CALCIUM 6.9* 6.7*  --  7.3*  MG 0.9*  1.0* 0.9* 1.7 1.5*  PHOS 3.6 3.3  --   --     CBC  Recent Labs Lab 05/17/17 0020 05/17/17 0212 05/18/17 0223  WBC 7.9 7.0 4.8  HGB 7.7* 7.3* 7.7*  HCT 24.0* 22.8* 24.5*  PLT 93* 92* 90*    Coag's  Recent Labs Lab 05/16/17 1653 05/17/17 0212 05/18/17 0223  APTT 28  --   --   INR 1.19 1.35 1.09    Sepsis Markers  Recent Labs Lab 05/16/17 1441 05/17/17 0020 05/17/17 0212 05/18/17 0223  LATICACIDVEN 0.97 0.8 0.7  --   PROCALCITON  --  1.46 1.33 0.60    ABG  Recent Labs Lab 05/16/17 2148 05/17/17 1226 05/18/17 0520  PHART 7.201* 7.421 7.388  PCO2ART 76.4* 44.4 50.2*  PO2ART 324* 68.1* 125*    Liver  Enzymes  Recent Labs Lab 05/16/17 1429 05/17/17 0020 05/18/17 0223  AST 854* 450* 141*  ALT 594* 432* 299*  ALKPHOS 106 88 71  BILITOT 1.3* 0.8 0.5  ALBUMIN 3.1* 2.5* 2.4*    Cardiac Enzymes  Recent Labs Lab 05/17/17 0020 05/17/17 0212 05/17/17 1222  TROPONINI 0.40* 0.36* 0.23*    Glucose  Recent Labs Lab 05/17/17 0805 05/17/17 2357  GLUCAP 111* 134*    Imaging Dg Chest Port 1 View  Result Date: 05/18/2017 CLINICAL DATA:  Hypoxia EXAM: PORTABLE CHEST 1 VIEW COMPARISON:  May 17, 2017 FINDINGS: Endotracheal tube tip is 3.1 cm above the carina. Nasogastric tube tip and side port below the diaphragm. No evident pneumothorax. There is persistent airspace opacity in the periphery of the right upper lobe, likely pneumonia. There is mild  bibasilar atelectatic change. Lungs elsewhere are clear. Heart is mildly enlarged with pulmonary vascularity within normal limits. No adenopathy. There is aortic atherosclerosis. There is an old healed fracture of the right posterior eighth rib. There are surgical clips in the right axillary region. IMPRESSION: Tube positions as described without pneumothorax. Persistent patchy airspace consolidation right upper lobe consistent with pneumonia, stable. Mild bibasilar atelectasis. Stable cardiac silhouette. There is aortic atherosclerosis. Aortic Atherosclerosis (ICD10-I70.0). Electronically Signed   By: Lowella Grip III M.D.   On: 05/18/2017 07:08   US Liver Doppler  Result Date: 05/17/2017 CLINICAL DATA:  Transaminitis EXAM: DUPLEX ULTRASOUND OF LIVER TECHNIQUE: Color and duplex Doppler ultrasound was performed to evaluate the hepatic in-flow and out-flow vessels. COMPARISON:  None. FINDINGS: Portal Vein Velocities Main:  36.9 cm/sec proximal; 48.1 mid; 35.4 distal Right:  49.4 cm/sec Left:  35.7 cm/sec Flow in the portal vein and its branches is hepatopetal. Hepatic Vein Velocities Right:  38.2 cm/sec Middle:  26.6 cm/sec Left:  41.3 cm/sec. Flow in the hepatic vein and its branches is hepatofugal. Hepatic Artery Velocity:  199.9 cm/sec Splenic Vein Velocity:  26.3 cm/sec Varices: None Ascites: None. There is no splenic or portal vein thrombus or occlusion evident. Spleen measures 11.3 x 12.7 x 5.1 cm with a measured splenic volume of 385 cubic cm. IMPRESSION: The portal vein and branches, hepatic vein and branches, hepatic artery, and splenic vein are all patent with flow in the appropriate anatomic direction. No thrombus or occlusion. No varices or ascites evident. Splenic size within normal limits. Electronically Signed   By: Lowella Grip III M.D.   On: 05/17/2017 13:34   US Abdomen Limited Ruq  Result Date: 05/17/2017 CLINICAL DATA:  Elevated liver enzymes EXAM: ULTRASOUND ABDOMEN LIMITED  RIGHT UPPER QUADRANT COMPARISON:  None. FINDINGS: Gallbladder: There is sludge in the gallbladder. There are no echogenic foci in the gallbladder which move and shadow as is expected with gallstones. No gallbladder wall thickening or pericholecystic fluid. No sonographic Murphy sign noted by sonographer. Common bile duct: Diameter: 4 mm. There is no intrahepatic or extrahepatic biliary duct dilatation. Liver: No focal lesion identified. Within normal limits in parenchymal echogenicity. Portal vein is patent on color Doppler imaging with normal direction of blood flow towards the liver. IMPRESSION: Sludge in gallbladder. No gallstones or gallbladder wall thickening evident. Study otherwise unremarkable. Electronically Signed   By: Lowella Grip III M.D.   On: 05/17/2017 12:57     STUDIES:  CT HEAD W/O 9/24:  No acute intracranial abnormality. 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 CT ABD/PELVIS W/ CONTRAST 9/24: IMPRESSION: 1. No evidence of colonic inflammation to suggest colitis. Minimal liquid stool  in the ascending colon which can be seen with diarrheal process. Motion limits detailed assessment despite sedation. 2. Mild gallbladder distention without calcified gallstone. 3. 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 4. Distended urinary bladder without wall thickening. 5. Uterine fibroid. 6. Aortic Atherosclerosis (ICD10-I70.0). PORT CXR 9/25:  Previously reviewed by me. Endotracheal tube in good position. Right upper lung patchy opacification. Mild silhouetting of right hemidiaphragm.  MICROBIOLOGY: MRSA PCR 9/24:  Negative  Blood Cultures x1 9/24 >>> Tracheal Aspirate Culture 9/25 >>> Respiratory Panel PCR 9/24:  negative Influenza A/B PCR 9/24:  Negative  Urine Streptococcal Antigen 9/25:  Negative  Urine Legionella Antigen 9/25 >>> CMV 9/25:  IgG >10 / IgM <30  ANTIBIOTICS: Levaquin 9/24 (x1 dose) Aztreonam 9/24 >>> Vancomycin  9/24 >>> Flagyl 9/24 >>>  SIGNIFICANT EVENTS: 09/24 - Admit & transferred to ICU with worsening mentation post Ativan >> intubated 09/26 - Self extubated  LINES/TUBES: OETT 9/24 - 9/26 (self-extubated) OGT 9/24 - 9/26 Foley 9/24 >>> PIV  ASSESSMENT / PLAN:  61 y.o. female with non-small cell lung cancer presenting with nausea and emesis as well as dehydration. Patient self extubated today but respiratory status at this moment seems relatively stable. Remains altered after receiving fentanyl earlier this morning. Given patient's multiple medical problems we will monitor her closely with her high risk of reintubation.  1. Acute encephalopathy: Multifactorial in the setting of sedation and hypercarbia. Discontinuing fentanyl and Precedex. Holding on any further sedatives. 2. Acute hypercarbic respiratory failure: Limiting sedation. Continuing Pulmicort nebulized twice a day & Duonebs scheduled. 3. COPD: No signs of acute exacerbation. Holding on systemic steroids. Continuing nebulizer therapies as an #2. 4. Possible sepsis: Unclear etiology but suspect postobstructive right upper lobe pneumonia. Continuing broad-spectrum antibiotics. Trending Procalcitonin per algorithm. Awaiting finalization of cultures and infectious workup. 5. Elevated troponin I: Suspect demand ischemia. Echocardiogram pending. Continuing aspirin. Restarting home Pravachol. Appreciated input from cardiology. Continuing heparin drip per protocol. 6. Acute renal failure: Resolving. Continuing to monitor urine output with Foley catheter. Trending electrolytes and renal function daily. 7. Nausea/vomiting/diarrhea: Awaiting C. difficile studies. Continuing Flagyl. 8. Transaminitis: Resolving. Suspect shock liver. Further autoimmune workup pending. Trending LFTs intermittently 9. History of anxiety/depression: Restarting home Cymbalta. Holding nocturnal Pamelor. 10. Fibromyalgia/neuropathy: Restarting home Neurontin at decreased  dose of 40 mg 400 mg 3 times a day (home dose 800 mg 3 times a day). 11. Hypokalemia: KCl 10 mEq IV 6 runs. Repeat electrolytes with a.m. labs. 12. Hypomagnesemia: Magnesium sulfate 4 g IV. Repeat magnesium level in a.m. 13. Anemia: No signs of active bleeding. Trending cell counts daily with CBC on heparin drip. 14. Thrombocytopenia: Platelet count stable. Continuing to monitor cell counts daily with CBC while on heparin drip.   Prophylaxis:  Heparin drip per protocol. Switching to Protonix PO daily in place of Dexilant. Diet:  NPO. Speech consulted for swallowing evaluation. Code Status:  Full Code per previous physician discussion. Disposition:  Continuing close ICU monitoring of respiratory status post extubation. Family Update:  No family at bedside in the ICU.  I have spent a total of 31 minutes of critical care time today caring for the patient and reviewing the patient's electronic medical record.   Sonia Baller Ashok Cordia, M.D. [facility] Pulmonary & Critical Care Pager:  [phone] After 3pm or if no response, call [phone] 05/18/2017, 7:56 AM

## 2017-05-18 NOTE — Progress Notes (Signed)
ANTICOAGULATION CONSULT NOTE - Follow Up Consult  Pharmacy Consult for heparin Indication: r/o ACS  Allergies  Allergen Reactions  . Penicillins Anaphylaxis and Other (See Comments)    Has patient had a PCN reaction causing immediate rash, facial/tongue/throat swelling, SOB or lightheadedness with hypotension:Yes Has patient had a PCN reaction causing severe rash involving mucus membranes or skin necrosis:Yes Has patient had a PCN reaction that required hospitalization:No Has patient had a PCN reaction occurring within the last 10 years:No If all of the above answers are "NO", then may proceed with Cephalosporin use.     Patient Measurements: Height: 5\' 1"  (154.9 cm) Weight: 168 lb 14 oz (76.6 kg) IBW/kg (Calculated) : 47.8 Heparin Dosing Weight:   Vital Signs: Temp: 98.3 F (36.8 C) (09/26 0307) Temp Source: Oral (09/26 0307) BP: 140/46 (09/26 0200) Pulse Rate: 78 (09/26 0200)  Labs:  Recent Labs  05/16/17 1653  05/17/17 0020 05/17/17 0212 05/17/17 0858 05/17/17 1222 05/17/17 1813 05/18/17 0223  HGB  --   --  7.7* 7.3*  --   --   --  7.7*  HCT  --   --  24.0* 22.8*  --   --   --  24.5*  PLT  --   --  93* 92*  --   --   --  90*  APTT 28  --   --   --   --   --   --   --   LABPROT 15.0  --   --  16.6*  --   --   --  14.1  INR 1.19  --   --  1.35  --   --   --  1.09  HEPARINUNFRC  --   --   --   --  <0.10*  --  <0.10* <0.10*  CREATININE  --   --  0.68 0.64  --   --   --  0.52  CKTOTAL  --   --  127  --   --   --   --   --   CKMB  --   --  5.7*  --   --   --   --   --   TROPONINI  --   < > 0.40* 0.36*  --  0.23*  --   --   < > = values in this interval not displayed.  Estimated Creatinine Clearance: 70.9 mL/min (by C-G formula based on SCr of 0.52 mg/dL).   Medications:  Infusions:  . aztreonam Stopped (05/17/17 2253)  . dexmedetomidine (PRECEDEX) IV infusion 1.2 mcg/kg/hr (05/18/17 0215)  . heparin 1,100 Units/hr (05/17/17 2200)  . metronidazole Stopped  (05/18/17 0112)  . vancomycin Stopped (05/17/17 1913)    Assessment: Patient with low heparin level again after rate increase.  PLT < 100K.  No heparin issues per RN.  Goal of Therapy:  Heparin level 0.3-0.7 units/ml Monitor platelets by anticoagulation protocol: Yes   Plan:  Increase heparin to 1350 units/hr Recheck level at [address] [place] 05/18/2017,3:51 AM

## 2017-05-18 NOTE — Evaluation (Signed)
SLP Cancellation Note  Patient Details Name: Sydney Blair MRN: [id number] DOB: October 17, 1955   Cancelled treatment:       Reason Eval/Treat Not Completed: Other (comment) (pt self extubated today at 8 am, has h/o dysphagia dating back to 2012, defer evaluation at this time due to concern for pharyngeal/laryngeal edema with self extubation, pt intubated 8/24/8/26 )   [name], [place] [facility] [place] [phone]

## 2017-05-18 NOTE — Progress Notes (Signed)
Nutrition Follow-up  DOCUMENTATION CODES:   Obesity unspecified  INTERVENTION:  - Diet advancement as medically feasible.  - RD will provide needed interventions at time of follow-up.   NUTRITION DIAGNOSIS:   Inadequate oral intake related to inability to eat as evidenced by NPO status. -ongoing  GOAL:   Patient will meet greater than or equal to 90% of their needs -unmet/unable to meet.   MONITOR:   Diet advancement, Weight trends, Labs, Skin  ASSESSMENT:   61 year old woman actively undergoing treatment for squamous cell carcinoma of the lung presented to the emergency department by ambulance with complaints of vomiting and diarrhea. Throughout her emergency department stay and early after admission he developed restlessness and disorientation which eventually progressed to obtundation and she was intubated. Her ED course is notable for findings of elevated BUN/creatinine ratio, metabolic alkalosis, acute hypercapnic respiratory failure, profoundly elevated LFTs and an elevated troponin. Head CT and CT chest are largely unrevealing.   9/26 Pt self extubated earlier this AM; OGT now out. Estimated nutrition needs updated based on this event with consideration of cancer on chemo and radiation and Dr. Ammie Dalton note states possible sepsis. Pt remains NPO. Weight stable from yesterday. Pt with acute encephalopathy; will attempt to obtain PTA information at the time of follow-up.  Medications reviewed; 100 mcg oral Synthroid/day, 4 g IV Mg sulfate x1 run today, 40 mg oral Protonix/day, 10 mEq IV KCl x6 runs today, 40 mEq oral KCl/day. Labs reviewed; CBG: 130 mg/dL this AM, K: 3.3 mmol/L, Mg: 1.5 mg/dL, Ca: 7.3 mg/dL, LFTs elevated.     9/25 - No family/visitors present at this time to provide PTA information.  - Per review of notes, pt with stage 3 squamous cell lung cancer on chemo (last: 9/10) and radiation (last: ~9/17).  - She was intubated in the ED yesterday at ~2:30 PM and OGT  placed at that time.   Physical assessment shows no muscle and no fat wasting, mild edema noted to extremities. Per chart review, weight -9 lbs (5.1% body weight) since 02/01/17. This is not significant for 3 month time frame.   Patient is currently intubated on ventilator support MV: 10.5 L/min Temp (24hrs), Avg:98.4 F (36.9 C), Min:98.1 F (36.7 C), Max:99 F (37.2 C) Propofol: 6.5 ml/hr (172 kcal from fat) BP: 120/58 and MAP: 78  4 g IV Mg sulfate x1 run today, 40 mEq KCl per OGT x1 dose today. K: 2.9 mmol/L, Mg: 0.9 mg/dL Drips: Heparin @ 900 units/hr, Propofol @ 15 mcg/kg/min, Precedex @ 0.6 mcg/kg/hr.     Diet Order:  Diet NPO time specified  Skin:  Reviewed, no issues  Last BM:  PTA/unknown  Height:   Ht Readings from Last 1 Encounters:  05/17/17 5\' 1"  (1.549 m)    Weight:   Wt Readings from Last 1 Encounters:  05/18/17 167 lb 5.3 oz (75.9 kg)    Ideal Body Weight:  47.73 kg  BMI:  Body mass index is 31.62 kg/m.  Estimated Nutritional Needs:   Kcal:  4665-9935 (32-35 kcal/kg)  Protein:  120-130 grams  Fluid:  >/= 2.2 L/day  EDUCATION NEEDS:   No education needs identified at this time    Jarome Matin, MS, RD, LDN, CNSC Inpatient Clinical Dietitian Pager # [phone] After hours/weekend pager # [phone]

## 2017-05-18 NOTE — Evaluation (Addendum)
Clinical/Bedside Swallow Evaluation Patient Details  Name: Sydney Blair MRN: [id number] Date of Birth: 01/02/1956  Today's Date: 05/18/2017 Time: SLP Start Time (ACUTE ONLY): 1605 SLP Stop Time (ACUTE ONLY): 1630 SLP Time Calculation (min) (ACUTE ONLY): 25 min  Past Medical History:  Past Medical History:  Diagnosis Date  . Anxiety   . ARF (acute renal failure) ([place]) 08/07/2011   ?   Marland Kitchen Arthritis   . Cancer ([place]) 2005   breast-right  . Chronic back pain   . COPD (chronic obstructive pulmonary disease) ([place])   . Depression   . Depression with anxiety   . Dyspnea    on exertion  . Fibromyalgia   . GERD (gastroesophageal reflux disease)   . Hypercholesteremia   . Hypothyroidism   . Incontinence   . Neuropathy   . Peripheral neuropathy   . Sciatic pain   . Sebaceous cyst    lt labial location  . Sleep apnea    uses CPAP  . Stage III squamous cell carcinoma of right lung ([place]) 03/10/2017  . Tobacco abuse    Past Surgical History:  Past Surgical History:  Procedure Laterality Date  . bladder tack    . BREAST BIOPSY Right 12/29/2016   Procedure: RIGHT BREAST BIOPSY;  Surgeon: Aviva Signs, MD;  Location: [facility];  Service: General;  Laterality: Right;  . BREAST LUMPECTOMY     right with node dissection  . COLONOSCOPY  11/11/2010   NTI:[id number] rectal polyp and splenic flexure otherwise normal; pathology showed  tubular adenomas. Next TCS 10/2015.  Marland Kitchen ESOPHAGOGASTRODUODENOSCOPY  11/11/2010   QPY:PPJKD hiatal hernia, 55 French dilator  . ESOPHAGOGASTRODUODENOSCOPY (EGD) WITH ESOPHAGEAL DILATION N/A 06/21/2013   TOI:ZTIWPY esophagus  - status post Maloney dilation s/p bx (benign esophageal bx)  . VIDEO BRONCHOSCOPY WITH ENDOBRONCHIAL ULTRASOUND N/A 02/28/2017   Procedure: VIDEO BRONCHOSCOPY WITH ENDOBRONCHIAL ULTRASOUND;  Surgeon: Juanito Doom, MD;  Location: [facility];  Service: Thoracic;  Laterality: N/A;   HPI:  61 yo female with h/o smoking, COPD, dysphagia dating  back to at least 2012, admitted with n/v/diarrhea - suffered respiratory failure and had to be intubated - Pt self extubated this am - She has h/o breast cancer with recent squamous cell lung carcinoma diagnosis and CXR showed right upper lobe airspace disease consistent with pna.  Pt also with h/o EGD due to esophageal deficits.  Swallow evaluation ordered.    Assessment / Plan / Recommendation Clinical Impression  Pt with premorbid dysphagia dating back to 2012 - with suspect current acute exacerbation secondary to deconditioning and intubation.  Pt's voice is hoarse and weak - which she states is worse than normal.   She did have strong cough, effective to expectorate viscous secretions x1.  Premorbid cough due to smoking reported.  Indication of overt aspiration present with thin via cup - c/b immediate excessive coughing.  After prolonged ineffective coughing/clearance of thin water, SlP orally suctioned to help clear secretions from deep posterior oral cavity.  Given h/o dysphagia, decreased mentation and symptoms of pharyngeal deficits - rec NPO except single ice chips and medications.  Hopeful for swallow to return to functional level with reversal of acute deficits.  Informed pt to concerns and clinical indications for further testing, she was agreeable to plan.  MBS indicated given h/o dysphagia and pt's medical status.   SLP Visit Diagnosis: Dysphagia, oropharyngeal phase (R13.12)    Aspiration Risk  Severe aspiration risk;Risk for inadequate nutrition/hydration    Diet Recommendation NPO;Ice chips  PRN after oral care;NPO except meds   Medication Administration: Crushed with puree Compensations:  (allow extra time for dry swallows with all boluses)    Other  Recommendations Oral Care Recommendations: Oral care QID   Follow up Recommendations  (tbd)      Frequency and Duration   n/a         Prognosis   tbd     Swallow Study   General Date of Onset: 05/18/17 HPI: 61 yo female  with h/o smoking, COPD, dysphagia dating back to at least 2012, admitted with n/v/diarrhea - suffered respiratory failure and had to be intubated - Pt self extubated this am - She has h/o squamous cell lung carcinoma and CXR showed right upper lobe airspace disease consistent with pna.  Swallow evaluation ordered.  Type of Study: Bedside Swallow Evaluation Diet Prior to this Study: NPO (x medicine) Temperature Spikes Noted: No Respiratory Status: Nasal cannula History of Recent Intubation: Yes Length of Intubations (days): 3 days Date extubated: 05/18/17 (self extubated) Behavior/Cognition: Alert;Cooperative;Other (Comment) (becomes sleepy quickly) Oral Cavity Assessment: Excessive secretions (posterior oral cavity) Oral Care Completed by SLP: Yes Oral Cavity - Dentition: Edentulous (dentures available) Vision: Functional for self-feeding Self-Feeding Abilities: Able to feed self Patient Positioning: Upright in bed Baseline Vocal Quality: Hoarse;Low vocal intensity Volitional Cough: Strong (productive x1 to viscous yellow secretions) Volitional Swallow: Able to elicit    Oral/Motor/Sensory Function Overall Oral Motor/Sensory Function: Within functional limits   Ice Chips Ice chips: Impaired Presentation: Spoon Oral Phase Impairments: Reduced lingual movement/coordination;Reduced labial seal Oral Phase Functional Implications: Oral holding Pharyngeal Phase Impairments: Suspected delayed Swallow   Thin Liquid Thin Liquid: Impaired Presentation: Self Fed;Cup Oral Phase Impairments: Other (comment) (suspected lingual weakness and prolonged transiting, ? piecemealing) Pharyngeal  Phase Impairments: Throat Clearing - Immediate;Cough - Immediate;Other (comments) Other Comments: excessive cough noted with second larger bolus    Nectar Thick Nectar Thick Liquid: Not tested   Honey Thick Honey Thick Liquid: Not tested   Puree Puree: Impaired Presentation: Spoon;Self Fed Oral Phase  Impairments: Reduced lingual movement/coordination Oral Phase Functional Implications: Prolonged oral transit Pharyngeal Phase Impairments: Multiple swallows   Solid   GO   Solid: Not tested        [name] 05/18/2017,5:21 PM   [name], [place] [facility] SLP [phone]

## 2017-05-18 NOTE — Progress Notes (Signed)
Pharmacy: [facility]  Patient's a 61 y.o F currently on heparin for r/o ACS.  Heparin level remains subtherapeutic at 0.15 (goal 0.3-0.7) but rate was not increased to 1550 unit/hr as ordered earlier today.  Per RN, heparin drip still running at 1350 ml/hr.  Plan: - heparin drip at 1550 units/hr - check 6 hr heparin level - monitor for s/s bleeding  Dia Sitter, PharmD, BCPS 05/18/2017 8:04 PM

## 2017-05-18 NOTE — Progress Notes (Signed)
ANTICOAGULATION CONSULT NOTE - Follow Up Consult  Pharmacy Consult for heparin Indication: r/o ACS  Allergies  Allergen Reactions  . Penicillins Anaphylaxis and Other (See Comments)    Has patient had a PCN reaction causing immediate rash, facial/tongue/throat swelling, SOB or lightheadedness with hypotension:Yes Has patient had a PCN reaction causing severe rash involving mucus membranes or skin necrosis:Yes Has patient had a PCN reaction that required hospitalization:No Has patient had a PCN reaction occurring within the last 10 years:No If all of the above answers are "NO", then may proceed with Cephalosporin use.     Patient Measurements: Height: 5\' 1"  (154.9 cm) Weight: 167 lb 5.3 oz (75.9 kg) IBW/kg (Calculated) : 47.8 Heparin Dosing Weight: 64 kg  Vital Signs: Temp: 98.2 F (36.8 C) (09/26 0800) Temp Source: Oral (09/26 0800) BP: 158/75 (09/26 1200) Pulse Rate: 103 (09/26 1200)  Labs:  Recent Labs  05/16/17 1653  05/17/17 0020 05/17/17 0212  05/17/17 1222 05/17/17 1813 05/18/17 0223 05/18/17 1141  HGB  --   --  7.7* 7.3*  --   --   --  7.7*  --   HCT  --   --  24.0* 22.8*  --   --   --  24.5*  --   PLT  --   --  93* 92*  --   --   --  90*  --   APTT 28  --   --   --   --   --   --   --   --   LABPROT 15.0  --   --  16.6*  --   --   --  14.1  --   INR 1.19  --   --  1.35  --   --   --  1.09  --   HEPARINUNFRC  --   --   --   --   < >  --  <0.10* <0.10* 0.20*  CREATININE  --   --  0.68 0.64  --   --   --  0.52  --   CKTOTAL  --   --  127  --   --   --   --   --   --   CKMB  --   --  5.7*  --   --   --   --   --   --   TROPONINI  --   < > 0.40* 0.36*  --  0.23*  --   --   --   < > = values in this interval not displayed.  Estimated Creatinine Clearance: 70.5 mL/min (by C-G formula based on SCr of 0.52 mg/dL).   Assessment: Patient's a 60 y.o F with hx lung cancer on undergoing chemotherapy treatment presented to the ED on 05/16/17 with c/o weakness and  diarrhea.  Heparin drip started on 9/24 for elevated troponin.  Today, 05/18/2017: - Heparin level increased but remains subtherapeutic, now 0.20 units/ml with infusion at 1350 units/hr. - Hgb 7.7 - slight improvement from yesterday - platelets 90K, stable from yesterday  Goal of Therapy:  Heparin level 0.3-0.7 units/ml Monitor platelets by anticoagulation protocol: Yes   Plan:  - Increase heparin infusion to 1550 units/hr - Check 6 hr heparin level  Hershal Coria , PharmD 05/18/2017,12:52 PM

## 2017-05-18 NOTE — Progress Notes (Signed)
Received call from Pharmacist informing RN about low Heparin levels. RN checked to see if IV was leaking or if there was any visible bleeding. RN noticed Heparin levels running at 1350 ml/hr instead of the ordered 1550 ml/hr. Informed Pharmacist. Orders given to increase drip rate to 1550 ml/hr and have lab recheck Heparin levels.

## 2017-05-18 NOTE — Progress Notes (Signed)
Patient removed ET tube by herself. Patient was recently medicated and receiving a continuous infusion of sedation medications. Also she was wearing mittens. Dr. Ashok Cordia informed and at bedside. Patient currently placed on nasal cannula and pulse ox at 100%. Will continue to monitor.

## 2017-05-19 ENCOUNTER — Inpatient Hospital Stay (HOSPITAL_COMMUNITY): Payer: Medicaid Other

## 2017-05-19 DIAGNOSIS — J9621 Acute and chronic respiratory failure with hypoxia: Secondary | ICD-10-CM

## 2017-05-19 DIAGNOSIS — R9431 Abnormal electrocardiogram [ECG] [EKG]: Secondary | ICD-10-CM

## 2017-05-19 LAB — BASIC METABOLIC PANEL
Anion gap: 8 (ref 5–15)
BUN: 6 mg/dL (ref 6–20)
CALCIUM: 8.3 mg/dL — AB (ref 8.9–10.3)
CHLORIDE: 99 mmol/L — AB (ref 101–111)
CO2: 31 mmol/L (ref 22–32)
CREATININE: 0.65 mg/dL (ref 0.44–1.00)
GFR calc Af Amer: >60 mL/min (ref >60)
GFR calc non Af Amer: >60 mL/min (ref >60)
Glucose, Bld: 90 mg/dL (ref 65–99)
Potassium: 3.3 mmol/L — ABNORMAL LOW (ref 3.5–5.1)
SODIUM: 138 mmol/L (ref 135–145)

## 2017-05-19 LAB — CBC WITH DIFFERENTIAL/PLATELET
BASOS ABS: 0.1 10*3/uL (ref 0.0–0.1)
Basophils Relative: 1 %
Eosinophils Absolute: 0 10*3/uL (ref 0.0–0.7)
Eosinophils Relative: 0 %
HEMATOCRIT: 27.6 % — AB (ref 36.0–46.0)
Hemoglobin: 8.8 g/dL — ABNORMAL LOW (ref 12.0–15.0)
LYMPHS ABS: 0.3 10*3/uL — AB (ref 0.7–4.0)
Lymphocytes Relative: 6 %
MCH: 29.8 pg (ref 26.0–34.0)
MCHC: 31.9 g/dL (ref 30.0–36.0)
MCV: 93.6 fL (ref 78.0–100.0)
MONO ABS: 0.4 10*3/uL (ref 0.1–1.0)
MONOS PCT: 7 %
NEUTROS PCT: 86 %
Neutro Abs: 4.5 10*3/uL (ref 1.7–7.7)
PLATELETS: 64 10*3/uL — AB (ref 150–400)
RBC: 2.95 MIL/uL — AB (ref 3.87–5.11)
RDW: 24.5 % — AB (ref 11.5–15.5)
WBC: 5.3 10*3/uL (ref 4.0–10.5)

## 2017-05-19 LAB — RENAL FUNCTION PANEL
ALBUMIN: 2.4 g/dL — AB (ref 3.5–5.0)
Anion gap: 8 (ref 5–15)
CO2: 32 mmol/L (ref 22–32)
CREATININE: 0.54 mg/dL (ref 0.44–1.00)
Calcium: 8.1 mg/dL — ABNORMAL LOW (ref 8.9–10.3)
Chloride: 100 mmol/L — ABNORMAL LOW (ref 101–111)
Glucose, Bld: 89 mg/dL (ref 65–99)
PHOSPHORUS: 3.4 mg/dL (ref 2.5–4.6)
Potassium: 3.5 mmol/L (ref 3.5–5.1)
Sodium: 140 mmol/L (ref 135–145)

## 2017-05-19 LAB — ECHOCARDIOGRAM COMPLETE
HEIGHTINCHES: 61 in
Weight: 2656.1 oz

## 2017-05-19 LAB — CULTURE, RESPIRATORY W GRAM STAIN

## 2017-05-19 LAB — CULTURE, RESPIRATORY

## 2017-05-19 LAB — GLUCOSE, CAPILLARY: GLUCOSE-CAPILLARY: 92 mg/dL (ref 65–99)

## 2017-05-19 LAB — HEPARIN LEVEL (UNFRACTIONATED): Heparin Unfractionated: 0.22 IU/mL — ABNORMAL LOW (ref 0.30–0.70)

## 2017-05-19 LAB — PROTIME-INR
INR: 1.13
Prothrombin Time: 14.4 seconds (ref 11.4–15.2)

## 2017-05-19 LAB — MAGNESIUM
MAGNESIUM: 1.4 mg/dL — AB (ref 1.7–2.4)
Magnesium: 2.3 mg/dL (ref 1.7–2.4)

## 2017-05-19 MED ORDER — METOPROLOL TARTRATE 25 MG PO TABS
25.0000 mg | ORAL_TABLET | Freq: Once | ORAL | Status: AC
Start: 2017-05-19 — End: 2017-05-19
  Administered 2017-05-19: 25 mg via ORAL
  Filled 2017-05-19: qty 1

## 2017-05-19 MED ORDER — LEVOFLOXACIN IN D5W 750 MG/150ML IV SOLN
750.0000 mg | INTRAVENOUS | Status: DC
  Administered 2017-05-19: 750 mg via INTRAVENOUS
  Filled 2017-05-19 (×2): qty 150

## 2017-05-19 MED ORDER — LEVALBUTEROL HCL 0.63 MG/3ML IN NEBU
0.6300 mg | INHALATION_SOLUTION | Freq: Four times a day (QID) | RESPIRATORY_TRACT | Status: DC
Start: 2017-05-19 — End: 2017-05-20
  Administered 2017-05-19 – 2017-05-20 (×5): 0.63 mg via RESPIRATORY_TRACT
  Filled 2017-05-19 (×5): qty 3

## 2017-05-19 MED ORDER — METOPROLOL TARTRATE 50 MG PO TABS
50.0000 mg | ORAL_TABLET | Freq: Three times a day (TID) | ORAL | Status: DC
  Administered 2017-05-19 – 2017-05-21 (×6): 50 mg via ORAL
  Filled 2017-05-19: qty 2
  Filled 2017-05-19: qty 1
  Filled 2017-05-19 (×2): qty 2
  Filled 2017-05-19 (×2): qty 1

## 2017-05-19 MED ORDER — POTASSIUM CHLORIDE 10 MEQ/100ML IV SOLN
10.0000 meq | INTRAVENOUS | Status: AC
  Administered 2017-05-19 (×4): 10 meq via INTRAVENOUS
  Filled 2017-05-19 (×4): qty 100

## 2017-05-19 MED ORDER — IPRATROPIUM-ALBUTEROL 0.5-2.5 (3) MG/3ML IN SOLN
3.0000 mL | Freq: Four times a day (QID) | RESPIRATORY_TRACT | Status: DC
  Administered 2017-05-19: 3 mL via RESPIRATORY_TRACT

## 2017-05-19 MED ORDER — MAGNESIUM SULFATE 50 % IJ SOLN
6.0000 g | Freq: Once | INTRAVENOUS | Status: AC
  Administered 2017-05-19: 6 g via INTRAVENOUS
  Filled 2017-05-19: qty 10

## 2017-05-19 MED ORDER — HEPARIN SODIUM (PORCINE) 5000 UNIT/ML IJ SOLN
5000.0000 [IU] | Freq: Three times a day (TID) | INTRAMUSCULAR | Status: DC
  Administered 2017-05-19 – 2017-05-20 (×5): 5000 [IU] via SUBCUTANEOUS
  Filled 2017-05-19 (×5): qty 1

## 2017-05-19 MED ORDER — METOPROLOL TARTRATE 5 MG/5ML IV SOLN
5.0000 mg | Freq: Once | INTRAVENOUS | Status: AC
  Administered 2017-05-19: 5 mg via INTRAVENOUS
  Filled 2017-05-19: qty 5

## 2017-05-19 NOTE — Progress Notes (Signed)
[facility] Progress Note Patient Name: Sydney Blair DOB: 29-Jun-1956 MRN: [id number]   Date of Service  05/19/2017  HPI/Events of Note  Tachycardia - regular with rate 157/min. EKG looks like SVT (vs 2:1 flutter).  Asymptomatic, BP OK  eICU Interventions  Metoprolol 5 MG iV ordered     Intervention Category Major Interventions: Other:  Wilhelmina Mcardle 05/19/2017, 4:39 AM

## 2017-05-19 NOTE — Progress Notes (Signed)
  Echocardiogram 2D Echocardiogram has been performed.  Darlina Sicilian M 05/19/2017, 8:52 AM

## 2017-05-19 NOTE — Progress Notes (Signed)
ANTICOAGULATION CONSULT NOTE - Follow Up Consult  Pharmacy Consult for Heparin Indication: chest pain/ACS  Allergies  Allergen Reactions  . Penicillins Anaphylaxis and Other (See Comments)    Has patient had a PCN reaction causing immediate rash, facial/tongue/throat swelling, SOB or lightheadedness with hypotension:Yes Has patient had a PCN reaction causing severe rash involving mucus membranes or skin necrosis:Yes Has patient had a PCN reaction that required hospitalization:No Has patient had a PCN reaction occurring within the last 10 years:No If all of the above answers are "NO", then may proceed with Cephalosporin use.     Patient Measurements: Height: 5\' 1"  (154.9 cm) Weight: 166 lb 0.1 oz (75.3 kg) IBW/kg (Calculated) : 47.8 Heparin Dosing Weight:   Vital Signs: Temp: 99 F (37.2 C) (09/27 0326) Temp Source: Oral (09/27 0326) BP: 144/79 (09/27 0211) Pulse Rate: 98 (09/27 0211)  Labs:  Recent Labs  05/16/17 1653  05/17/17 0020 05/17/17 0212  05/17/17 1222  05/18/17 0223 05/18/17 1141 05/18/17 2041 05/19/17 0351  HGB  --   --  7.7* 7.3*  --   --   --  7.7*  --   --  8.8*  HCT  --   --  24.0* 22.8*  --   --   --  24.5*  --   --  27.6*  PLT  --   --  93* 92*  --   --   --  90*  --   --  64*  APTT 28  --   --   --   --   --   --   --   --   --   --   LABPROT 15.0  --   --  16.6*  --   --   --  14.1  --   --  14.4  INR 1.19  --   --  1.35  --   --   --  1.09  --   --  1.13  HEPARINUNFRC  --   --   --   --   < >  --   < > <0.10* 0.20* 0.15* 0.22*  CREATININE  --   --  0.68 0.64  --   --   --  0.52  --  0.50 0.54  CKTOTAL  --   --  127  --   --   --   --   --   --   --   --   CKMB  --   --  5.7*  --   --   --   --   --   --   --   --   TROPONINI  --   < > 0.40* 0.36*  --  0.23*  --   --   --   --   --   < > = values in this interval not displayed.  Estimated Creatinine Clearance: 70.3 mL/min (by C-G formula based on SCr of 0.54 mg/dL).   Medications:   Infusions:  . aztreonam Stopped (05/18/17 2158)  . heparin 1,550 Units/hr (05/18/17 1801)  . metronidazole Stopped (05/19/17 0018)  . vancomycin Stopped (05/18/17 1901)    Assessment: Patient with low heparin level.  No heparin issues per RN.  Goal of Therapy:  Heparin level 0.3-0.7 units/ml Monitor platelets by anticoagulation protocol: Yes   Plan:  Increase heparin to 1700 units/hr Recheck level level at [address] [place] 05/19/2017,5:04 AM

## 2017-05-19 NOTE — Progress Notes (Signed)
Modified Barium Swallow Progress Note  Patient Details  Name: Sydney Blair MRN: [id number] Date of Birth: 1956/04/03  Today's Date: 05/19/2017  Modified Barium Swallow completed.  Full report located under Chart Review in the Imaging Section.  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  Premature spillage with liquids apparent which contributes to decreased timing of laryngeal closure resulting in laryngeal penetration of nectar and penetration and trace aspiration x1 of thin.  Pt did not cough with aspiration but cued cough effective to clear secretions.  Chin tuck did not prevent penetration but most penetrates were cleared with same swallow.  Pharyngeal swallow is strong without pharyngeal residual across all consistencies.  She piecemeals with solids/purees - and currently does not have dentures at the hospital.   Pt's primary source of dysphagia appears to be esophageal - please note prior esopahgram showing age related dysmotility and ? small hiatal hernia.  Barium tablet given with pudding appeared to lodge at mid-esophagus without pt sensation.  Liquid wash did not clear tablet and resulted in retrograde propulsion of liquid.  Large bolus of pudding eventually forced tablet into stomach.  Suspect baseline dysmotility may be exacerbated due to pt's acute illness.  Unfortunately at this time, pt is not sensate to esophageal residuals nor backflow.  Suspect primary esophageal deficits for this pt.  Aspiration of thin was minimal and not present with small single boluses. Recommend strict esophageal strategies for maximal airway protection.      Swallow Evaluation Recommendations       SLP Diet Recommendations: Dysphagia 3 (Mech soft) solids;Thin liquid (pt has dentures at home, will need to ask family to bring for her)   Liquid Administration via: Cup;No straw   Medication Administration: Whole meds with puree   Supervision: Patient able to self feed   Compensations: Slow rate;Small sips/bites;Other (Comment)   Postural Changes: Seated upright at 90 degrees;Remain semi-upright after after feeds/meals (Comment)   Oral Care Recommendations: Oral care BID      [name], MS [facility] SLP [phone]   [name] 05/19/2017,11:27 AM

## 2017-05-19 NOTE — Progress Notes (Signed)
PULMONARY / CRITICAL CARE MEDICINE   Name: Sydney Blair MRN: [id number] DOB: 08/01/1956    ADMISSION DATE:  05/16/2017  CHIEF COMPLAINT: acute mental status change following N&V  PRESENTING HPI:  61 y.o.  female currently undergoing treatment for squamous cell carcinoma the lung. Presenting with a two-week history of nausea with vomiting and diarrhea. Patient developed worsening mentation with delirium in the emergency department shortly after admission. Ultimately received IV Ativan and had worsening hypercarbic respiratory failure and with decreased mentation required endotracheal intubation.  SUBJECTIVE/interval:  Patient with some SVT this morning. Denies any chest pain, pressure, or palpitations. Denies any dyspnea or cough.  REVIEW OF SYSTEMS:  No abdominal pain or nausea. No bowel movements or diarrhea. No subjective fever or chills.  VITAL SIGNS: BP (!) 149/67   Pulse 91   Temp 99 F (37.2 C) (Oral)   Resp (!) 23   Ht 5\' 1"  (1.549 m)   Wt 166 lb 0.1 oz (75.3 kg)   SpO2 95%   BMI 31.37 kg/m   HEMODYNAMICS:    VENTILATOR SETTINGS:    INTAKE / OUTPUT:  Intake/Output Summary (Last 24 hours) at 05/19/17 0734 Last data filed at 05/19/17 8119  Gross per 24 hour  Intake          1424.99 ml  Output             3150 ml  Net         -1725.01 ml     PHYSICAL EXAMINATION: General:  Awake. Watching TV. No distress. Integument: No rash on exposed skin. Warm. Dry. Extremities:  No cyanosis.  HEENT:  No scleral icterus. No scleral injection. Pupils symmetric. Cardiovascular:  Irregular rhythm with regular rate. No edema or JVD appreciated.  Pulmonary:  Normal work of breathing on nasal cannula. Clear with auscultation bilaterally. Abdomen: Nontender. Soft. Her to grip. Normal bowel sounds. Neurological: Alert and oriented 4. No meningismus. Grossly nonfocal.  LABS:  BMET  Recent Labs Lab 05/18/17 0223 05/18/17 2041 05/19/17 0351  NA 139 140 140  K 3.3*  3.9 3.5  CL 103 103 100*  CO2 27 31 32  BUN 7 <5* <5*  CREATININE 0.52 0.50 0.54  GLUCOSE 129* 90 89    Electrolytes  Recent Labs Lab 05/17/17 0020 05/17/17 0212 05/17/17 1429 05/18/17 0223 05/18/17 2041 05/19/17 0351  CALCIUM 6.9* 6.7*  --  7.3* 7.9* 8.1*  MG 0.9*  1.0* 0.9* 1.7 1.5*  --  1.4*  PHOS 3.6 3.3  --   --   --  3.4    CBC  Recent Labs Lab 05/17/17 0212 05/18/17 0223 05/19/17 0351  WBC 7.0 4.8 5.3  HGB 7.3* 7.7* 8.8*  HCT 22.8* 24.5* 27.6*  PLT 92* 90* 64*    Coag's  Recent Labs Lab 05/16/17 1653 05/17/17 0212 05/18/17 0223 05/19/17 0351  APTT 28  --   --   --   INR 1.19 1.35 1.09 1.13    Sepsis Markers  Recent Labs Lab 05/16/17 1441 05/17/17 0020 05/17/17 0212 05/18/17 0223  LATICACIDVEN 0.97 0.8 0.7  --   PROCALCITON  --  1.46 1.33 0.60    ABG  Recent Labs Lab 05/16/17 2148 05/17/17 1226 05/18/17 0520  PHART 7.201* 7.421 7.388  PCO2ART 76.4* 44.4 50.2*  PO2ART 324* 68.1* 125*    Liver Enzymes  Recent Labs Lab 05/17/17 0020 05/18/17 0223 05/18/17 2041 05/19/17 0351  AST 450* 141* 67*  --   ALT 432* 299* 245*  --  ALKPHOS 88 71 86  --   BILITOT 0.8 0.5 0.6  --   ALBUMIN 2.5* 2.4* 2.5* 2.4*    Cardiac Enzymes  Recent Labs Lab 05/17/17 0020 05/17/17 0212 05/17/17 1222  TROPONINI 0.40* 0.36* 0.23*    Glucose  Recent Labs Lab 05/17/17 0805 05/17/17 2357 05/18/17 0805  GLUCAP 111* 134* 130*    Imaging No results found.   STUDIES:  CT HEAD W/O 9/24:  No acute intracranial abnormality. 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 CT ABD/PELVIS W/ CONTRAST 9/24: IMPRESSION: 1. No evidence of colonic inflammation to suggest colitis. Minimal liquid stool in the ascending colon which can be seen with diarrheal process. Motion limits detailed assessment despite sedation. 2. Mild gallbladder distention without calcified gallstone. 3. 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 4. Distended urinary bladder without wall thickening. 5. Uterine fibroid. 6. Aortic Atherosclerosis (ICD10-I70.0). PORT CXR 9/25:  Previously reviewed by me. Endotracheal tube in good position. Right upper lung patchy opacification. Mild silhouetting of right hemidiaphragm.  MICROBIOLOGY: MRSA PCR 9/24:  Negative  Blood Cultures x1 9/24 >>> Tracheal Aspirate Culture 9/25 >>> Moderate Staph aureus  Urine Culture 9/25:  Negative Respiratory Panel PCR 9/24:  negative Influenza A/B PCR 9/24:  Negative  Urine Streptococcal Antigen 9/25:  Negative  Urine Legionella Antigen 9/25 >>> CMV 9/25:  IgG >10 / IgM <30  ANTIBIOTICS: Levaquin 9/24 (x1 dose) Aztreonam 9/24 >>> Vancomycin 9/24 >>> Flagyl 9/24 >>>  SIGNIFICANT EVENTS: 09/24 - Admit & transferred to ICU with worsening mentation post Ativan >> intubated 09/26 - Self extubated 09/27 - SVT in early AM >> Lopressor IV ordered  LINES/TUBES: OETT 9/24 - 9/26 (self-extubated) OGT 9/24 - 9/26 Foley 9/24 >>> PIV  ASSESSMENT / PLAN:  61 y.o. female with non-small cell lung cancer presenting with nausea, vomiting, diarrhea and dehydration. Respiratory status stable post self extubation yesterday. Mental status steadily improving. Modified barium swallow pending for diet and oral medications. Given patient's SVT I feel that remaining in the stepdown unit would be safest.   1. Acute encephalopathy: Resolved. Likely multifactorial due to hypercarbia and sedatives. Holding any further sedation. 2. Acute hypoxic and hypercarbic respiratory failure: Resolving. Continuing Duonebs 4 times a day & Pulmicort nebulized twice daily. Avoiding sedatives. 3. COPD: No signs of acute exacerbation. Continuing to hold systemic steroids. Continuing nebulizer therapies as a #2. 4. Possible sepsis: Suspect staph aureus postobstructive pneumonia verrsus diarrheal illness. Awaiting finalization of cultures. Trending Procalcitonin per algorithm.  Continuing broad-spectrum antibiotics. Discontinuing C. difficile precautions and PCR. 5. Staphylococcus aureus pneumonia: Likely postobstructive from malignancy. Awaiting sensitivities. Continuing broad-spectrum antibiotics. 6. Elevated troponin I: Suspect demand ischemia. Heparin drip per pharmacy protocol. Still awaiting echocardiogram. Cardiology previously consulted. 7. SVT: Correcting electrolyte abnormalities. Continuous telemetry monitoring. Lopressor IV when necessary. 8. Hypomagnesemia: Magnesium sulfate 6 g IV. Repeat magnesium level at 5 PM. 9. Acute renal failure: Resolved. Discontinuing Foley catheter today. Trending electrolytes and renal function daily. 10. Transaminitis: Resolving. Likely secondary to dehydration. Autoimmune workup pending. Trending LFTs intermittently. 11. Nausea/vomiting/diarrhea: Resolved. Question possible gastroenteritis. Continuing Flagyl for now. 12. History of anxiety/depression: Continuing home Cymbalta once able to take oral medications. Holding nocturnal Pamelor. 13.  Fibromyalgia/neuropathy: Restarting home Neurontin at decreased dose once patient able to take by mouth. 14. Anemia: No signs of active bleeding. Hemoglobin stable. Trending cell counts daily with CBC. 15. Thrombocytopenia: Platelet count slightly worse today. Continuing to monitor cell counts while on heparin drip.  Prophylaxis:  Heparin  drip per protocol. Protonix PO daily in place of Dexilant. Diet:  NPO. Speech consulted & MBS pending. Code Status:  Full Code per previous physician discussion. Disposition: Transitioning to SDU. Family Update:  No family at bedside in the ICU.  I have spent a total of 36 minutes of time today caring for the patient, reviewing the patient's electronic medical record, and with more than 50% of that time spent coordinating care with the patient as well as reviewing the continuing plan of care with the patient at bedside.  TRH to assume care & PCCM off as  of 9/28.  Sonia Baller Ashok Cordia, M.D. [facility] Pulmonary [facility] Critical Care Pager:  [phone] After 3pm or if no response, call [phone] 05/19/2017, 7:34 AM

## 2017-05-19 NOTE — Progress Notes (Signed)
Progress Note  Patient Name: Sydney Blair Date of Encounter: 05/19/2017  Primary Cardiologist: Dr. Percival Spanish  Subjective   Patient denies chest pain, currently getting a breathing treatment.  Inpatient Medications    Scheduled Meds: . aspirin  81 mg Oral Daily  . budesonide (PULMICORT) nebulizer solution  0.5 mg Nebulization BID  . chlorhexidine gluconate (MEDLINE KIT)  15 mL Mouth Rinse BID  . DULoxetine  60 mg Oral Daily  . gabapentin  400 mg Oral TID  . ipratropium-albuterol  3 mL Nebulization QID  . levothyroxine  100 mcg Oral QAC breakfast  . mouth rinse  15 mL Mouth Rinse QID  . metoprolol tartrate  25 mg Oral TID  . olopatadine  1 drop Both Eyes BID  . pantoprazole  40 mg Oral Daily  . pravastatin  40 mg Oral q1800   Continuous Infusions: . aztreonam 2 g (05/19/17 0530)  . heparin 1,700 Units/hr (05/19/17 0504)  . magnesium sulfate 1 - 4 g bolus IVPB    . metronidazole Stopped (05/19/17 0018)  . potassium chloride    . vancomycin Stopped (05/19/17 5053)   PRN Meds: albuterol   Vital Signs    Vitals:   05/19/17 0326 05/19/17 0457 05/19/17 0500 05/19/17 0600  BP:   126/71 (!) 149/67  Pulse:   90 91  Resp:   (!) 24 (!) 23  Temp: 99 F (37.2 C)     TempSrc: Oral     SpO2:   (!) 89% 95%  Weight:  166 lb 0.1 oz (75.3 kg)    Height:        Intake/Output Summary (Last 24 hours) at 05/19/17 0753 Last data filed at 05/19/17 9767  Gross per 24 hour  Intake          1424.99 ml  Output             3150 ml  Net         -1725.01 ml   Filed Weights   05/17/17 0339 05/18/17 0500 05/19/17 0457  Weight: 168 lb 14 oz (76.6 kg) 167 lb 5.3 oz (75.9 kg) 166 lb 0.1 oz (75.3 kg)     Physical Exam   General: Well developed, well nourished, female appearing in no acute distress. Head: Normocephalic, atraumatic.  Neck: Supple without bruits, JVD. Lungs:  Resp regular and unlabored, wheezes and rhonchi throughout Heart: RRR, S1, S2, no murmur; no  rub. Abdomen: Soft, non-tender, non-distended with normoactive bowel sounds. No hepatomegaly. No rebound/guarding. No obvious abdominal masses. Extremities: No clubbing, cyanosis, no edema. Distal pedal pulses are 2+ bilaterally. Neuro: Alert and oriented X 3. Moves all extremities spontaneously. Psych: Normal affect.  Labs    Chemistry Recent Labs Lab 05/17/17 0020  05/18/17 0223 05/18/17 2041 05/19/17 0351  NA 141  < > 139 140 140  K 2.8*  < > 3.3* 3.9 3.5  CL 102  < > 103 103 100*  CO2 28  < > 27 31 32  GLUCOSE 91  < > 129* 90 89  BUN 11  < > 7 <5* <5*  CREATININE 0.68  < > 0.52 0.50 0.54  CALCIUM 6.9*  < > 7.3* 7.9* 8.1*  PROT 5.7*  --  5.5* 6.1*  --   ALBUMIN 2.5*  --  2.4* 2.5* 2.4*  AST 450*  --  141* 67*  --   ALT 432*  --  299* 245*  --   ALKPHOS 88  --  71 86  --  BILITOT 0.8  --  0.5 0.6  --   GFRNONAA >60  < > >60 >60 >60  GFRAA >60  < > >60 >60 >60  ANIONGAP 11  < > '9 6 8  '$ < > = values in this interval not displayed.   Hematology Recent Labs Lab 05/17/17 0212 05/18/17 0223 05/19/17 0351  WBC 7.0 4.8 5.3  RBC 2.47* 2.68* 2.95*  HGB 7.3* 7.7* 8.8*  HCT 22.8* 24.5* 27.6*  MCV 92.3 91.4 93.6  MCH 29.6 28.7 29.8  MCHC 32.0 31.4 31.9  RDW 24.7* 25.1* 24.5*  PLT 92* 90* 64*    Cardiac Enzymes Recent Labs Lab 05/16/17 1733 05/17/17 0020 05/17/17 0212 05/17/17 1222  TROPONINI 0.36* 0.40* 0.36* 0.23*    Recent Labs Lab 05/16/17 1440  TROPIPOC 0.23*     BNPNo results for input(s): BNP, PROBNP in the last 168 hours.   DDimer No results for input(s): DDIMER in the last 168 hours.   Radiology    Dg Chest Port 1 View  Result Date: 05/18/2017 CLINICAL DATA:  Hypoxia EXAM: PORTABLE CHEST 1 VIEW COMPARISON:  May 17, 2017 FINDINGS: Endotracheal tube tip is 3.1 cm above the carina. Nasogastric tube tip and side port below the diaphragm. No evident pneumothorax. There is persistent airspace opacity in the periphery of the right upper lobe,  likely pneumonia. There is mild bibasilar atelectatic change. Lungs elsewhere are clear. Heart is mildly enlarged with pulmonary vascularity within normal limits. No adenopathy. There is aortic atherosclerosis. There is an old healed fracture of the right posterior eighth rib. There are surgical clips in the right axillary region. IMPRESSION: Tube positions as described without pneumothorax. Persistent patchy airspace consolidation right upper lobe consistent with pneumonia, stable. Mild bibasilar atelectasis. Stable cardiac silhouette. There is aortic atherosclerosis. Aortic Atherosclerosis (ICD10-I70.0). Electronically Signed   By: Lowella Grip III M.D.   On: 05/18/2017 07:08   US Liver Doppler  Result Date: 05/17/2017 CLINICAL DATA:  Transaminitis EXAM: DUPLEX ULTRASOUND OF LIVER TECHNIQUE: Color and duplex Doppler ultrasound was performed to evaluate the hepatic in-flow and out-flow vessels. COMPARISON:  None. FINDINGS: Portal Vein Velocities Main:  36.9 cm/sec proximal; 48.1 mid; 35.4 distal Right:  49.4 cm/sec Left:  35.7 cm/sec Flow in the portal vein and its branches is hepatopetal. Hepatic Vein Velocities Right:  38.2 cm/sec Middle:  26.6 cm/sec Left:  41.3 cm/sec. Flow in the hepatic vein and its branches is hepatofugal. Hepatic Artery Velocity:  199.9 cm/sec Splenic Vein Velocity:  26.3 cm/sec Varices: None Ascites: None. There is no splenic or portal vein thrombus or occlusion evident. Spleen measures 11.3 x 12.7 x 5.1 cm with a measured splenic volume of 385 cubic cm. IMPRESSION: The portal vein and branches, hepatic vein and branches, hepatic artery, and splenic vein are all patent with flow in the appropriate anatomic direction. No thrombus or occlusion. No varices or ascites evident. Splenic size within normal limits. Electronically Signed   By: Lowella Grip III M.D.   On: 05/17/2017 13:34   US Abdomen Limited Ruq  Result Date: 05/17/2017 CLINICAL DATA:  Elevated liver enzymes EXAM:  ULTRASOUND ABDOMEN LIMITED RIGHT UPPER QUADRANT COMPARISON:  None. FINDINGS: Gallbladder: There is sludge in the gallbladder. There are no echogenic foci in the gallbladder which move and shadow as is expected with gallstones. No gallbladder wall thickening or pericholecystic fluid. No sonographic Murphy sign noted by sonographer. Common bile duct: Diameter: 4 mm. There is no intrahepatic or extrahepatic biliary duct dilatation. Liver: No focal  lesion identified. Within normal limits in parenchymal echogenicity. Portal vein is patent on color Doppler imaging with normal direction of blood flow towards the liver. IMPRESSION: Sludge in gallbladder. No gallstones or gallbladder wall thickening evident. Study otherwise unremarkable. Electronically Signed   By: Lowella Grip III M.D.   On: 05/17/2017 12:57     Telemetry    Sinus, sinus arrhythmia, sinus tachycardia - Personally Reviewed  ECG    05/19/17: SVT at 0423 - Personally Reviewed   Cardiac Studies   Echocardiogram 05/18/17: pending   Patient Profile     61 y.o. female with a hx of COPD, Rt lung cancer, sleep apnea, hypothyroid, HLD, GERD and anxiety who is being seen for the evaluation of EKGs and elevated troponin  Assessment & Plan    1. Elevated troponin, abnormal EKG - on heparin drip - 0.36 --> 0.40 --> 0.36 --> 0.23 - likely demand ischemia in the setting of respiratory failure - pt denies chest pain - echo still pending - will consider OP stress test after discharge - beta blocker started yesterday - if echo is normal, will consider D/C heparin drip  2. Hypokalemia - improved overnight to 3.9 after IV replacement per primary team - K back down to 3.5 - will defer to primary team to keep closer to K - normal kidney function  3. Anemia - Hb improved to 8.8 (7.7)  4. COPD - maintaining on [place] - asked RT to change albuterol to xopenex for SVT overnight  5. Prolonged QTc - yesterday EKG with QTc 482 ms - today,  QTc 501 ms during SVT - repeat EKG today in NSR for QTc  6. SVT - documented at 0423 this morning - was given 5 mg IV lopressor at 0447 with conversion to sinus, but she had 2 more bouts of SVT that self-converted - currently she is NSR in the 90s - lopressor 25 mg TID - increase this to 50 mg TID - Mg 1.4 after 4 g IV Mg yesterday - replace with another 6 g Mg IV today (per primary team) - keep K close to 4 - change albuterol to xopenex   Signed, Ledora Bottcher , PA-C 7:53 AM 05/19/2017 Pager: [phone]  History and all data above reviewed.  Patient examined.  I agree with the findings as above.   SVT noted.  She did not feel this.  She denies any chest pain.  Echo showed NL LV function.  I reviewed these images.  The final report is pending.   The patient exam reveals COR:RRR  ,  Lungs: clear  ,  Abd: Positive bowel sounds, no rebound no guarding, Ext no edema  .  All available labs, radiology testing, previous records reviewed. Agree with documented assessment and plan. SVT:  Increased beta blocker.  ELEVATED TROPONIN:  Stopped heparin.  No in patient studies are planned.  Prolonged QT:  Prolonged but improved.  No change in therapy necessary.   Jeneen Rinks [facility]  11:36 AM  05/19/2017

## 2017-05-20 LAB — RENAL FUNCTION PANEL
ALBUMIN: 2.5 g/dL — AB (ref 3.5–5.0)
Anion gap: 10 (ref 5–15)
BUN: 7 mg/dL (ref 6–20)
CALCIUM: 8.5 mg/dL — AB (ref 8.9–10.3)
CHLORIDE: 99 mmol/L — AB (ref 101–111)
CO2: 30 mmol/L (ref 22–32)
CREATININE: 0.6 mg/dL (ref 0.44–1.00)
Glucose, Bld: 89 mg/dL (ref 65–99)
PHOSPHORUS: 3.4 mg/dL (ref 2.5–4.6)
Potassium: 3 mmol/L — ABNORMAL LOW (ref 3.5–5.1)
Sodium: 139 mmol/L (ref 135–145)

## 2017-05-20 LAB — CBC WITH DIFFERENTIAL/PLATELET
Basophils Absolute: 0 10*3/uL (ref 0.0–0.1)
Basophils Relative: 0 %
EOS PCT: 1 %
Eosinophils Absolute: 0.1 10*3/uL (ref 0.0–0.7)
HCT: 25.3 % — ABNORMAL LOW (ref 36.0–46.0)
Hemoglobin: 8.1 g/dL — ABNORMAL LOW (ref 12.0–15.0)
LYMPHS ABS: 0.4 10*3/uL — AB (ref 0.7–4.0)
Lymphocytes Relative: 7 %
MCH: 29.6 pg (ref 26.0–34.0)
MCHC: 32 g/dL (ref 30.0–36.0)
MCV: 92.3 fL (ref 78.0–100.0)
MONO ABS: 0.5 10*3/uL (ref 0.1–1.0)
Monocytes Relative: 10 %
NEUTROS ABS: 4.3 10*3/uL (ref 1.7–7.7)
Neutrophils Relative %: 82 %
PLATELETS: 53 10*3/uL — AB (ref 150–400)
RBC: 2.74 MIL/uL — ABNORMAL LOW (ref 3.87–5.11)
RDW: 24 % — AB (ref 11.5–15.5)
WBC: 5.3 10*3/uL (ref 4.0–10.5)

## 2017-05-20 LAB — MAGNESIUM: Magnesium: 1.5 mg/dL — ABNORMAL LOW (ref 1.7–2.4)

## 2017-05-20 LAB — PROTIME-INR
INR: 1.15
Prothrombin Time: 14.6 seconds (ref 11.4–15.2)

## 2017-05-20 LAB — LEGIONELLA PNEUMOPHILA SEROGP 1 UR AG: L. PNEUMOPHILA SEROGP 1 UR AG: NEGATIVE

## 2017-05-20 LAB — GLUCOSE, CAPILLARY: Glucose-Capillary: 89 mg/dL (ref 65–99)

## 2017-05-20 MED ORDER — DOXYCYCLINE HYCLATE 100 MG PO TABS
100.0000 mg | ORAL_TABLET | Freq: Two times a day (BID) | ORAL | Status: DC
  Administered 2017-05-20 – 2017-05-21 (×3): 100 mg via ORAL
  Filled 2017-05-20 (×3): qty 1

## 2017-05-20 MED ORDER — DULOXETINE HCL 20 MG PO CPEP
40.0000 mg | ORAL_CAPSULE | Freq: Every day | ORAL | Status: DC
  Administered 2017-05-20 – 2017-05-21 (×2): 40 mg via ORAL
  Filled 2017-05-20 (×2): qty 2

## 2017-05-20 MED ORDER — MAGNESIUM SULFATE 2 GM/50ML IV SOLN
2.0000 g | Freq: Once | INTRAVENOUS | Status: AC
  Administered 2017-05-20: 2 g via INTRAVENOUS
  Filled 2017-05-20: qty 50

## 2017-05-20 MED ORDER — MAGNESIUM OXIDE 400 (241.3 MG) MG PO TABS
200.0000 mg | ORAL_TABLET | Freq: Two times a day (BID) | ORAL | Status: DC
  Administered 2017-05-20: 200 mg via ORAL
  Filled 2017-05-20: qty 1

## 2017-05-20 MED ORDER — POTASSIUM CHLORIDE CRYS ER 20 MEQ PO TBCR
40.0000 meq | EXTENDED_RELEASE_TABLET | ORAL | Status: AC
  Administered 2017-05-20 (×2): 40 meq via ORAL
  Filled 2017-05-20 (×2): qty 2

## 2017-05-20 MED ORDER — LEVALBUTEROL HCL 0.63 MG/3ML IN NEBU
0.6300 mg | INHALATION_SOLUTION | Freq: Three times a day (TID) | RESPIRATORY_TRACT | Status: DC
  Administered 2017-05-20 – 2017-05-21 (×2): 0.63 mg via RESPIRATORY_TRACT
  Filled 2017-05-20 (×3): qty 3

## 2017-05-20 MED ORDER — FERROUS SULFATE 325 (65 FE) MG PO TABS
325.0000 mg | ORAL_TABLET | Freq: Two times a day (BID) | ORAL | Status: DC
  Administered 2017-05-20 – 2017-05-21 (×2): 325 mg via ORAL
  Filled 2017-05-20 (×3): qty 1

## 2017-05-20 NOTE — Progress Notes (Signed)
PROGRESS NOTE    Sydney Blair  JXB:[id number] DOB: 1956-02-18 DOA: 05/16/2017 PCP: [name], MD    Brief Narrative:  61 y.o. female currently undergoing treatment for squamous cell carcinoma the lung. Presenting with a two-week history of nausea with vomiting and diarrhea. Patient developed worsening mentation with delirium in the emergency department shortly after admission. Ultimately received IV Ativan and had worsening hypercarbic respiratory failure and with decreased mentation required endotracheal intubation.   Assessment & Plan:   Principal Problem:   Hepatitis Active Problems:   TOBACCO ABUSE   GERD   Hypokalemia   Nausea vomiting and diarrhea   SIRS (systemic inflammatory response syndrome) (HCC)   COPD (chronic obstructive pulmonary disease) (HCC)   Abnormal CXR   Anemia   Thrombocytopenia (HCC)   Depression   Anxiety   Chronic pain   Acute on chronic respiratory failure (HCC)   Pressure injury of skin   1-Acute encephalopathy; related to infection, hypercapnia and sedative. Resolved.   Acute hypoxic and hypercarbic respiratory failure: COPD: No signs of acute exacerbation. 09/24 - Admit & transferred to ICU with worsening mentation post Ativan >> intubated 09/26 - Self extubated Improving. Continue with nebulizer,  Pulmicort.   Sepsis: Suspect staph aureus postobstructive pneumonia verrsus diarrheal illness Related to PNA.  Change Levaquin to Doxy due to QT prolongation.   Staphylococcus aureus pneumonia Change Levaquin to Doxy due to QT prolongation.   Elevated troponin I: Suspect demand ischemia.  Stress test outpatient.   SVT; on metoprolol. Replete electrolytes.   Hypomagnesemia; IV mg, and oral.   Transaminates; Autoimmune workup pending  Nausea/vomiting/diarrhea: Resolved. Question possible gastroenteritis.  Improved. Will stop flagyl;.    History of anxiety/depression: Continuing home Cymbalta . Holding nocturnal  Pamelor.  Fibromyalgia/neuropathy: Resume  Neurontin lower dose.   Anemia: No signs of active bleeding. Hemoglobin stable. Trending cell counts daily with CBC. Start ferrous sulfate.   Thrombocytopenia: Platelet count slightly worse today. Continuing to monitor cell counts while on heparin drip.  squamous cell carcinoma the lung; needs to follow up with primary oncologist     DVT prophylaxis: heparin  Code Status: full code.  Family Communication; family at beside.  Disposition Plan:  Home in 24 hours.    Consultants:   CCM  Procedures:  09/24 - Admit & transferred to ICU with worsening mentation post Ativan >> intubated 09/26 - Self extubated 09/27 - SVT in early AM >> Lopressor IV ordered  LINES/TUBES: OETT 9/24 - 9/26 (self-extubated) OGT 9/24 - 9/26 Foley 9/24 >>> PIV   Antimicrobials:  Levaquin 9/24 (x1 dose) Aztreonam 9/24 >>> Vancomycin 9/24 >>> Flagyl 9/24 >>>  MICROBIOLOGY: MRSA PCR 9/24: Negative  Blood Cultures x1 9/24 >>> Tracheal Aspirate Culture 9/25 >>> Moderate Staph aureus  Urine Culture 9/25:  Negative Respiratory Panel PCR 9/24:  negative Influenza A/B PCR 9/24: Negative  Urine Streptococcal Antigen 9/25:  Negative  Urine Legionella Antigen 9/25 >>> CMV 9/25:  IgG >10 / IgM <30    Subjective: She is feeling better, breathing better. Back to baseline MS>   Objective: Vitals:   05/20/17 0555 05/20/17 0558 05/20/17 0600 05/20/17 0700  BP:   (!) 154/77 (!) 155/66  Pulse:   73 78  Resp:   17 (!) 27  Temp:      TempSrc:      SpO2: 98% 97% 100% 90%  Weight:      Height:        Intake/Output Summary (Last 24 hours) at 05/20/17 [facility]  Last data filed at 05/20/17 0742  Gross per 24 hour  Intake              300 ml  Output             1476 ml  Net            -1176 ml   Filed Weights   05/18/17 0500 05/19/17 0457 05/20/17 0405  Weight: 75.9 kg (167 lb 5.3 oz) 75.3 kg (166 lb 0.1 oz) 73.2 kg (161 lb 6 oz)     Examination:  General exam: Appears calm and comfortable  Respiratory system: Clear to auscultation. Respiratory effort normal. Cardiovascular system: S1 & S2 heard, RRR. No JVD, murmurs, rubs, gallops or clicks. No pedal edema. Gastrointestinal system: Abdomen is nondistended, soft and nontender. No organomegaly or masses felt. Normal bowel sounds heard. Central nervous system: Alert and oriented. No focal neurological deficits. Extremities: Symmetric 5 x 5 power. Skin: No rashes, lesions or ulcers    Data Reviewed: I have personally reviewed following labs and imaging studies  CBC:  Recent Labs Lab 05/16/17 1429 05/17/17 0020 05/17/17 0212 05/18/17 0223 05/19/17 0351 05/20/17 0304  WBC 9.3 7.9 7.0 4.8 5.3 5.3  NEUTROABS 8.5*  --   --   --  4.5 4.3  HGB 9.0* 7.7* 7.3* 7.7* 8.8* 8.1*  HCT 27.9* 24.0* 22.8* 24.5* 27.6* 25.3*  MCV 88.3 92.0 92.3 91.4 93.6 92.3  PLT 121* 93* 92* 90* 64* 53*   Basic Metabolic Panel:  Recent Labs Lab 05/17/17 0020 05/17/17 0212 05/17/17 1429 05/18/17 0223 05/18/17 2041 05/19/17 0351 05/19/17 1717 05/20/17 0304  NA 141 140  --  139 140 140 138 139  K 2.8* 2.9*  --  3.3* 3.9 3.5 3.3* 3.0*  CL 102 101  --  103 103 100* 99* 99*  CO2 28 27  --  27 31 32 31 30  GLUCOSE 91 82  --  129* 90 89 90 89  BUN 11 11  --  7 <5* <5* 6 7  CREATININE 0.68 0.64  --  0.52 0.50 0.54 0.65 0.60  CALCIUM 6.9* 6.7*  --  7.3* 7.9* 8.1* 8.3* 8.5*  MG 0.9*  1.0* 0.9* 1.7 1.5*  --  1.4* 2.3 1.5*  PHOS 3.6 3.3  --   --   --  3.4  --  3.4   GFR: Estimated Creatinine Clearance: 69.3 mL/min (by C-G formula based on SCr of 0.6 mg/dL). Liver Function Tests:  Recent Labs Lab 05/16/17 1429 05/17/17 0020 05/18/17 0223 05/18/17 2041 05/19/17 0351 05/20/17 0304  AST 854* 450* 141* 67*  --   --   ALT 594* 432* 299* 245*  --   --   ALKPHOS 106 88 71 86  --   --   BILITOT 1.3* 0.8 0.5 0.6  --   --   PROT 6.8 5.7* 5.5* 6.1*  --   --   ALBUMIN 3.1* 2.5*  2.4* 2.5* 2.4* 2.5*    Recent Labs Lab 05/16/17 1733  LIPASE 15    Recent Labs Lab 05/16/17 1830 05/17/17 1222  AMMONIA 10 21   Coagulation Profile:  Recent Labs Lab 05/16/17 1653 05/17/17 0212 05/18/17 0223 05/19/17 0351 05/20/17 0304  INR 1.19 1.35 1.09 1.13 1.15   Cardiac Enzymes:  Recent Labs Lab 05/16/17 1733 05/17/17 0020 05/17/17 0212 05/17/17 1222  CKTOTAL  --  127  --   --   CKMB  --  5.7*  --   --  TROPONINI 0.36* 0.40* 0.36* 0.23*   BNP (last 3 results) No results for input(s): PROBNP in the last 8760 hours. HbA1C: No results for input(s): HGBA1C in the last 72 hours. CBG:  Recent Labs Lab 05/17/17 0805 05/17/17 2357 05/18/17 0805 05/19/17 0928 05/20/17 0758  GLUCAP 111* 134* 130* 92 89   Lipid Profile: No results for input(s): CHOL, HDL, LDLCALC, TRIG, CHOLHDL, LDLDIRECT in the last 72 hours. Thyroid Function Tests: No results for input(s): TSH, T4TOTAL, FREET4, T3FREE, THYROIDAB in the last 72 hours. Anemia Panel: No results for input(s): VITAMINB12, FOLATE, FERRITIN, TIBC, IRON, RETICCTPCT in the last 72 hours. Sepsis Labs:  Recent Labs Lab 05/16/17 1441 05/17/17 0020 05/17/17 0212 05/18/17 0223  PROCALCITON  --  1.46 1.33 0.60  LATICACIDVEN 0.97 0.8 0.7  --     Recent Results (from the past 240 hour(s))  Culture, blood (single)     Status: None (Preliminary result)   Collection Time: 05/16/17  2:34 PM  Result Value Ref Range Status   Specimen Description BLOOD RIGHT FOREARM  Final   Special Requests   Final    BOTTLES DRAWN AEROBIC AND ANAEROBIC Blood Culture results may not be optimal due to an inadequate volume of blood received in culture bottles   Culture   Final    NO GROWTH 3 DAYS Performed at [facility], [place] [address] [place], [place] [address]    Report Status PENDING  Incomplete  Respiratory Panel by PCR     Status: None   Collection Time: 05/16/17 10:28 PM  Result Value Ref Range Status    Adenovirus NOT DETECTED NOT DETECTED Final   Coronavirus 229E NOT DETECTED NOT DETECTED Final   Coronavirus HKU1 NOT DETECTED NOT DETECTED Final   Coronavirus NL63 NOT DETECTED NOT DETECTED Final   Coronavirus OC43 NOT DETECTED NOT DETECTED Final   Metapneumovirus NOT DETECTED NOT DETECTED Final   Rhinovirus / Enterovirus NOT DETECTED NOT DETECTED Final   Influenza A NOT DETECTED NOT DETECTED Final   Influenza B NOT DETECTED NOT DETECTED Final   Parainfluenza Virus 1 NOT DETECTED NOT DETECTED Final   Parainfluenza Virus 2 NOT DETECTED NOT DETECTED Final   Parainfluenza Virus 3 NOT DETECTED NOT DETECTED Final   Parainfluenza Virus 4 NOT DETECTED NOT DETECTED Final   Respiratory Syncytial Virus NOT DETECTED NOT DETECTED Final   Bordetella pertussis NOT DETECTED NOT DETECTED Final   Chlamydophila pneumoniae NOT DETECTED NOT DETECTED Final   Mycoplasma pneumoniae NOT DETECTED NOT DETECTED Final    Comment: Performed at [facility], [place] [address] [place], [place] [address]  MRSA PCR Screening     Status: None   Collection Time: 05/16/17 11:40 PM  Result Value Ref Range Status   MRSA by PCR NEGATIVE NEGATIVE Final    Comment:        The GeneXpert MRSA Assay (FDA approved for NASAL specimens only), is one component of a comprehensive MRSA colonization surveillance program. It is not intended to diagnose MRSA infection nor to guide or monitor treatment for MRSA infections.   Culture, respiratory (NON-Expectorated)     Status: None   Collection Time: 05/17/17 12:53 AM  Result Value Ref Range Status   Specimen Description TRACHEAL ASPIRATE  Final   Special Requests NONE  Final   Gram Stain   Final    ABUNDANT WBC PRESENT, PREDOMINANTLY PMN MODERATE GRAM POSITIVE COCCI IN PAIRS IN CLUSTERS MODERATE GRAM NEGATIVE COCCOBACILLI Performed at [facility], [address] [place], [place] [address]    Culture MODERATE STAPHYLOCOCCUS AUREUS  Final   Report Status 05/19/2017  FINAL  Final   Organism ID, Bacteria STAPHYLOCOCCUS AUREUS  Final      Susceptibility   Staphylococcus aureus - MIC*    CIPROFLOXACIN <=0.5 SENSITIVE Sensitive     ERYTHROMYCIN <=0.25 SENSITIVE Sensitive     GENTAMICIN <=0.5 SENSITIVE Sensitive     OXACILLIN 0.5 SENSITIVE Sensitive     TETRACYCLINE <=1 SENSITIVE Sensitive     VANCOMYCIN <=0.5 SENSITIVE Sensitive     TRIMETH/SULFA <=10 SENSITIVE Sensitive     CLINDAMYCIN <=0.25 SENSITIVE Sensitive     RIFAMPIN <=0.5 SENSITIVE Sensitive     Inducible Clindamycin NEGATIVE Sensitive     * MODERATE STAPHYLOCOCCUS AUREUS  Urine culture     Status: None   Collection Time: 05/17/17 12:50 PM  Result Value Ref Range Status   Specimen Description URINE, RANDOM  Final   Special Requests NONE  Final   Culture   Final    NO GROWTH Performed at [facility], [place] [address], [place], [place] [address]    Report Status 05/18/2017 FINAL  Final         Radiology Studies: Dg Swallowing Func-speech Pathology  Result Date: 05/19/2017 Objective Swallowing Evaluation: Type of Study: MBS-Modified Barium Swallow Study Patient Details Name: Sydney Blair MRN: [id number] Date of Birth: 1956/02/26 Today's Date: 05/19/2017 Time: SLP Start Time (ACUTE ONLY): 1015-SLP Stop Time (ACUTE ONLY): 1052 SLP Time Calculation (min) (ACUTE ONLY): 37 min Past Medical History: Past Medical History: Diagnosis Date . Anxiety  . ARF (acute renal failure) ([place]) 08/07/2011  ?  Marland Kitchen Arthritis  . Cancer ([place]) 2005  breast-right . Chronic back pain  . COPD (chronic obstructive pulmonary disease) ([facility])  . Depression  . Depression with anxiety  . Dyspnea   on exertion . Fibromyalgia  . GERD (gastroesophageal reflux disease)  . Hypercholesteremia  . Hypothyroidism  . Incontinence  . Neuropathy  . Peripheral neuropathy  . Sciatic pain  . Sebaceous cyst   lt labial location . Sleep apnea   uses CPAP . Stage III squamous cell carcinoma of right lung ([facility]) 03/10/2017 . Tobacco abuse   Past Surgical History: Past Surgical History: Procedure Laterality Date . bladder tack   . BREAST BIOPSY Right 12/29/2016  Procedure: RIGHT BREAST BIOPSY;  Surgeon: Aviva Signs, MD;  Location: [facility];  Service: General;  Laterality: Right; . BREAST LUMPECTOMY    right with node dissection . COLONOSCOPY  11/11/2010  DXI:[id number] rectal polyp and splenic flexure otherwise normal; pathology showed  tubular adenomas. Next TCS 10/2015. Marland Kitchen ESOPHAGOGASTRODUODENOSCOPY  11/11/2010  QBH:ALPFX hiatal hernia, 87 French dilator . ESOPHAGOGASTRODUODENOSCOPY (EGD) WITH ESOPHAGEAL DILATION N/A 06/21/2013  TKW:IOXBDZ esophagus  - status post Maloney dilation s/p bx (benign esophageal bx) . VIDEO BRONCHOSCOPY WITH ENDOBRONCHIAL ULTRASOUND N/A 02/28/2017  Procedure: VIDEO BRONCHOSCOPY WITH ENDOBRONCHIAL ULTRASOUND;  Surgeon: Juanito Doom, MD;  Location: [facility] OR;  Service: Thoracic;  Laterality: N/A; HPI: 61 yo female with h/o smoking, COPD, dysphagia dating back to at least 2012, admitted with n/v/diarrhea - suffered respiratory failure and had to be intubated - Pt self extubated this am - She has h/o squamous cell lung carcinoma and CXR showed right upper lobe airspace disease consistent with pna.  Swallow evaluation ordered.  Subjective: pt in bed, sleepy but participative Assessment / Plan / Recommendation CHL IP CLINICAL IMPRESSIONS 05/19/2017 Clinical Impression Mild oropharyngeal dysphagia mostly consistent with MBS from  03/2013 - with decreased oral coordination, transiting - most notably with solids. Premature spillage with liquids apparent which contributes to decreased timing of laryngeal closure resulting in laryngeal penetration of nectar and penetration and trace aspiration x1 of thin.  Pt did not cough with aspiration but cued cough effective to clear secretions.  Chin tuck did not prevent penetration but most penetrates were cleared with same swallow.  Pharyngeal swallow is strong without pharyngeal residual across all  consistencies.  She piecemeals with solids/purees - and currently does not have dentures at the hospital.   Pt's primary source of dysphagia appears to be esophageal - please note prior esopahgram showing age related dysmotility and ? small hiatal hernia.  Barium tablet given with pudding appeared to lodge at mid-esophagus without pt sensation.  Liquid wash did not clear tablet and resulted in retrograde propulsion of liquid.  Large bolus of pudding eventually forced tablet into stomach.  Suspect baseline dysmotility may be exacerbated due to pt's acute illness.  Unfortunately at this time, pt is not sensate to esophageal residuals nor backflow.  Suspect primary esophageal deficits for this pt.  Aspiration of thin was minimal and not present with small single boluses. Recommend strict esophageal strategies for maximal airway protection.    SLP Visit Diagnosis Dysphagia, oropharyngeal phase (R13.12);Dysphagia, unspecified (R13.10) Attention and concentration deficit following -- Frontal lobe and executive function deficit following -- Impact on safety and function Mild aspiration risk   CHL IP TREATMENT RECOMMENDATION 05/19/2017 Treatment Recommendations Therapy as outlined in treatment plan below   Prognosis 05/19/2017 Prognosis for Safe Diet Advancement Fair Barriers to Reach Goals Time post onset Barriers/Prognosis Comment -- CHL IP DIET RECOMMENDATION 05/19/2017 SLP Diet Recommendations Dysphagia 3 (Mech soft) solids;Thin liquid Liquid Administration via Cup;No straw Medication Administration Whole meds with puree Compensations Slow rate;Small sips/bites;Other (Comment) Postural Changes Seated upright at 90 degrees;Remain semi-upright after after feeds/meals (Comment)   CHL IP OTHER RECOMMENDATIONS 05/19/2017 Recommended Consults -- Oral Care Recommendations Oral care BID Other Recommendations --   CHL IP FOLLOW UP RECOMMENDATIONS 05/18/2017 Follow up Recommendations (No Data)   CHL IP FREQUENCY AND DURATION 05/19/2017  Speech Therapy Frequency (ACUTE ONLY) min 1 x/week Treatment Duration 1 week      CHL IP ORAL PHASE 04/09/2013 Oral Phase -- Oral - Pudding Teaspoon -- Oral - Pudding Cup -- Oral - Honey Teaspoon -- Oral - Honey Cup -- Oral - Nectar Teaspoon -- Oral - Nectar Cup -- Oral - Nectar Straw -- Oral - Thin Teaspoon -- Oral - Thin Cup -- Oral - Thin Straw -- Oral - Puree -- Oral - Mech Soft -- Oral - Regular -- Oral - Multi-Consistency -- Oral - Pill -- Oral Phase - Comment Mild prolonged oral transit with solids  [name], MS [facility] SLP [phone]                    Scheduled Meds: . aspirin  81 mg Oral Daily  . budesonide (PULMICORT) nebulizer solution  0.5 mg Nebulization BID  . chlorhexidine gluconate (MEDLINE KIT)  15 mL Mouth Rinse BID  . DULoxetine  60 mg Oral Daily  . gabapentin  400 mg Oral TID  . heparin  5,000 Units Subcutaneous Q8H  . levalbuterol  0.63 mg Nebulization QID  . levothyroxine  100 mcg Oral QAC breakfast  . mouth rinse  15 mL Mouth Rinse QID  . metoprolol tartrate  50 mg Oral TID  . olopatadine  1 drop Both Eyes BID  .  pantoprazole  40 mg Oral Daily  . pravastatin  40 mg Oral q1800   Continuous Infusions: . levofloxacin (LEVAQUIN) IV Stopped (05/19/17 1701)  . metronidazole 500 mg (05/20/17 0807)     LOS: 4 days    Time spent: 35 minutes.     Elmarie Shiley, MD [facility] Pager [phone]  If 7PM-7AM, please contact night-coverage [email or web address] Password [facility] 05/20/2017, 8:18 AM

## 2017-05-20 NOTE — Progress Notes (Signed)
Progress Note  Patient Name: Sydney Blair Date of Encounter: 05/20/2017  Primary Cardiologist:   Dr. Percival Spanish  Subjective   She is feeling well and wants to go home.  No chest pain or SOB.   Inpatient Medications    Scheduled Meds: . aspirin  81 mg Oral Daily  . budesonide (PULMICORT) nebulizer solution  0.5 mg Nebulization BID  . chlorhexidine gluconate (MEDLINE KIT)  15 mL Mouth Rinse BID  . DULoxetine  40 mg Oral Daily  . DULoxetine  60 mg Oral Daily  . gabapentin  400 mg Oral TID  . heparin  5,000 Units Subcutaneous Q8H  . levalbuterol  0.63 mg Nebulization QID  . levothyroxine  100 mcg Oral QAC breakfast  . mouth rinse  15 mL Mouth Rinse QID  . metoprolol tartrate  50 mg Oral TID  . olopatadine  1 drop Both Eyes BID  . pantoprazole  40 mg Oral Daily  . potassium chloride  40 mEq Oral Q4H   Continuous Infusions: . levofloxacin (LEVAQUIN) IV Stopped (05/19/17 1701)  . magnesium sulfate 1 - 4 g bolus IVPB 2 g (05/20/17 0908)  . metronidazole Stopped (05/20/17 0907)   PRN Meds: albuterol   Vital Signs    Vitals:   05/20/17 0600 05/20/17 0700 05/20/17 0800 05/20/17 0921  BP: (!) 154/77 (!) 155/66  (!) 143/51  Pulse: 73 78  85  Resp: 17 (!) 27    Temp:   97.6 F (36.4 C)   TempSrc:   Oral   SpO2: 100% 90%    Weight:      Height:        Intake/Output Summary (Last 24 hours) at 05/20/17 0925 Last data filed at 05/20/17 0742  Gross per 24 hour  Intake              300 ml  Output             1476 ml  Net            -1176 ml   Filed Weights   05/18/17 0500 05/19/17 0457 05/20/17 0405  Weight: 167 lb 5.3 oz (75.9 kg) 166 lb 0.1 oz (75.3 kg) 161 lb 6 oz (73.2 kg)    Telemetry    NSR with PVCs - Personally Reviewed  ECG    NA - Personally Reviewed  Physical Exam   GEN: No acute distress.   Neck: No  JVD Cardiac: RRR, No murmurs, rubs, or gallops.  Respiratory: Clear  to auscultation bilaterally. GI: Soft, nontender, non-distended  MS: No   edema; No deformity. Neuro:  Nonfocal  Psych: Normal affect   Labs    Chemistry Recent Labs Lab 05/17/17 0020  05/18/17 0223 05/18/17 2041 05/19/17 0351 05/19/17 1717 05/20/17 0304  NA 141  < > 139 140 140 138 139  K 2.8*  < > 3.3* 3.9 3.5 3.3* 3.0*  CL 102  < > 103 103 100* 99* 99*  CO2 28  < > 27 31 32 31 30  GLUCOSE 91  < > 129* 90 89 90 89  BUN 11  < > 7 <5* <5* 6 7  CREATININE 0.68  < > 0.52 0.50 0.54 0.65 0.60  CALCIUM 6.9*  < > 7.3* 7.9* 8.1* 8.3* 8.5*  PROT 5.7*  --  5.5* 6.1*  --   --   --   ALBUMIN 2.5*  --  2.4* 2.5* 2.4*  --  2.5*  AST 450*  --  141* 67*  --   --   --   ALT 432*  --  299* 245*  --   --   --   ALKPHOS 88  --  71 86  --   --   --   BILITOT 0.8  --  0.5 0.6  --   --   --   GFRNONAA >60  < > >60 >60 >60 >60 >60  GFRAA >60  < > >60 >60 >60 >60 >60  ANIONGAP 11  < > '9 6 8 8 10  '$ < > = values in this interval not displayed.   Hematology Recent Labs Lab 05/18/17 0223 05/19/17 0351 05/20/17 0304  WBC 4.8 5.3 5.3  RBC 2.68* 2.95* 2.74*  HGB 7.7* 8.8* 8.1*  HCT 24.5* 27.6* 25.3*  MCV 91.4 93.6 92.3  MCH 28.7 29.8 29.6  MCHC 31.4 31.9 32.0  RDW 25.1* 24.5* 24.0*  PLT 90* 64* 53*    Cardiac Enzymes Recent Labs Lab 05/16/17 1733 05/17/17 0020 05/17/17 0212 05/17/17 1222  TROPONINI 0.36* 0.40* 0.36* 0.23*    Recent Labs Lab 05/16/17 1440  TROPIPOC 0.23*     BNPNo results for input(s): BNP, PROBNP in the last 168 hours.   DDimer No results for input(s): DDIMER in the last 168 hours.   Radiology    Dg Swallowing Func-speech Pathology  Result Date: 05/19/2017 Objective Swallowing Evaluation: Type of Study: MBS-Modified Barium Swallow Study Patient Details Name: Sydney Blair MRN: [id number] Date of Birth: 18-Mar-1956 Today's Date: 05/19/2017 Time: SLP Start Time (ACUTE ONLY): 1015-SLP Stop Time (ACUTE ONLY): 1052 SLP Time Calculation (min) (ACUTE ONLY): 37 min Past Medical History: Past Medical History: Diagnosis Date . Anxiety  .  ARF (acute renal failure) ([place]) 08/07/2011  ?  Marland Kitchen Arthritis  . Cancer ([place]) 2005  breast-right . Chronic back pain  . COPD (chronic obstructive pulmonary disease) ([place])  . Depression  . Depression with anxiety  . Dyspnea   on exertion . Fibromyalgia  . GERD (gastroesophageal reflux disease)  . Hypercholesteremia  . Hypothyroidism  . Incontinence  . Neuropathy  . Peripheral neuropathy  . Sciatic pain  . Sebaceous cyst   lt labial location . Sleep apnea   uses CPAP . Stage III squamous cell carcinoma of right lung ([place]) 03/10/2017 . Tobacco abuse  Past Surgical History: Past Surgical History: Procedure Laterality Date . bladder tack   . BREAST BIOPSY Right 12/29/2016  Procedure: RIGHT BREAST BIOPSY;  Surgeon: Aviva Signs, MD;  Location: [facility];  Service: General;  Laterality: Right; . BREAST LUMPECTOMY    right with node dissection . COLONOSCOPY  11/11/2010  MOQ:[id number] rectal polyp and splenic flexure otherwise normal; pathology showed  tubular adenomas. Next TCS 10/2015. Marland Kitchen ESOPHAGOGASTRODUODENOSCOPY  11/11/2010  TWS:FKCLE hiatal hernia, 57 French dilator . ESOPHAGOGASTRODUODENOSCOPY (EGD) WITH ESOPHAGEAL DILATION N/A 06/21/2013  XNT:ZGYFVC esophagus  - status post Maloney dilation s/p bx (benign esophageal bx) . VIDEO BRONCHOSCOPY WITH ENDOBRONCHIAL ULTRASOUND N/A 02/28/2017  Procedure: VIDEO BRONCHOSCOPY WITH ENDOBRONCHIAL ULTRASOUND;  Surgeon: Juanito Doom, MD;  Location: [facility];  Service: Thoracic;  Laterality: N/A; HPI: 61 yo female with h/o smoking, COPD, dysphagia dating back to at least 2012, admitted with n/v/diarrhea - suffered respiratory failure and had to be intubated - Pt self extubated this am - She has h/o squamous cell lung carcinoma and CXR showed right upper lobe airspace disease consistent with pna.  Swallow evaluation ordered.  Subjective: pt in bed, sleepy but participative Assessment / Plan /  Recommendation CHL IP CLINICAL IMPRESSIONS 05/19/2017 Clinical Impression Mild oropharyngeal dysphagia  mostly consistent with MBS from 03/2013 - with decreased oral coordination, transiting - most notably with solids. Premature spillage with liquids apparent which contributes to decreased timing of laryngeal closure resulting in laryngeal penetration of nectar and penetration and trace aspiration x1 of thin.  Pt did not cough with aspiration but cued cough effective to clear secretions.  Chin tuck did not prevent penetration but most penetrates were cleared with same swallow.  Pharyngeal swallow is strong without pharyngeal residual across all consistencies.  She piecemeals with solids/purees - and currently does not have dentures at the hospital.   Pt's primary source of dysphagia appears to be esophageal - please note prior esopahgram showing age related dysmotility and ? small hiatal hernia.  Barium tablet given with pudding appeared to lodge at mid-esophagus without pt sensation.  Liquid wash did not clear tablet and resulted in retrograde propulsion of liquid.  Large bolus of pudding eventually forced tablet into stomach.  Suspect baseline dysmotility may be exacerbated due to pt's acute illness.  Unfortunately at this time, pt is not sensate to esophageal residuals nor backflow.  Suspect primary esophageal deficits for this pt.  Aspiration of thin was minimal and not present with small single boluses. Recommend strict esophageal strategies for maximal airway protection.    SLP Visit Diagnosis Dysphagia, oropharyngeal phase (R13.12);Dysphagia, unspecified (R13.10) Attention and concentration deficit following -- Frontal lobe and executive function deficit following -- Impact on safety and function Mild aspiration risk   CHL IP TREATMENT RECOMMENDATION 05/19/2017 Treatment Recommendations Therapy as outlined in treatment plan below   Prognosis 05/19/2017 Prognosis for Safe Diet Advancement Fair Barriers to Reach Goals Time post onset Barriers/Prognosis Comment -- CHL IP DIET RECOMMENDATION 05/19/2017 SLP Diet  Recommendations Dysphagia 3 (Mech soft) solids;Thin liquid Liquid Administration via Cup;No straw Medication Administration Whole meds with puree Compensations Slow rate;Small sips/bites;Other (Comment) Postural Changes Seated upright at 90 degrees;Remain semi-upright after after feeds/meals (Comment)   CHL IP OTHER RECOMMENDATIONS 05/19/2017 Recommended Consults -- Oral Care Recommendations Oral care BID Other Recommendations --   CHL IP FOLLOW UP RECOMMENDATIONS 05/18/2017 Follow up Recommendations (No Data)   CHL IP FREQUENCY AND DURATION 05/19/2017 Speech Therapy Frequency (ACUTE ONLY) min 1 x/week Treatment Duration 1 week      CHL IP ORAL PHASE 04/09/2013 Oral Phase -- Oral - Pudding Teaspoon -- Oral - Pudding Cup -- Oral - Honey Teaspoon -- Oral - Honey Cup -- Oral - Nectar Teaspoon -- Oral - Nectar Cup -- Oral - Nectar Straw -- Oral - Thin Teaspoon -- Oral - Thin Cup -- Oral - Thin Straw -- Oral - Puree -- Oral - Mech Soft -- Oral - Regular -- Oral - Multi-Consistency -- Oral - Pill -- Oral Phase - Comment Mild prolonged oral transit with solids  [name], MS [facility] SLP [phone]                Cardiac Studies   ECHO:  - Left ventricle: The cavity size was normal. Wall thickness was   normal. Systolic function was normal. The estimated ejection   fraction was in the range of 55% to 60%. Wall motion was normal;   there were no regional wall motion abnormalities. Features are   consistent with a pseudonormal left ventricular filling pattern,   with concomitant abnormal relaxation and increased filling   pressure (grade 2 diastolic dysfunction). - Aortic valve: There was no stenosis. - Mitral valve: There  was trivial regurgitation. - Left atrium: The atrium was at the upper limits of normal in   size. - Right ventricle: The cavity size was normal. Systolic function   was normal. - Tricuspid valve: Peak RV-RA gradient (S): 28 mm Hg. - Pulmonary arteries: PA peak pressure: 31 mm Hg (S). -  Inferior vena cava: The vessel was normal in size. The   respirophasic diameter changes were in the normal range (>= 50%),   consistent with normal central venous pressure.  Patient Profile     61 y.o. female with a hx of COPD, Rt lung cancer, sleep apnea, hypothyroid, HLD, GERD and anxiety who is being seen for the evaluation of EKG changes and elevated troponin.  She also has had paroxysmal SVT.   Assessment & Plan    ELEVATED TROPONIN:  Likely demand ischemia.  Trend was flat and echo with normal EF.  Plan out patient stress testing.    PROLONGED QT:  Repeat QT was improved yesterday but still prolonged.  Follow once off Flagyl.  No symptoms or history consistent with long QT syndrome.   SVT:  Beta blocker increased yesterday.    No further SVT.   Continue current dose of beta blocker.   HYPOKALEMIA:  Primary team is supplementing.    Signed, Minus Breeding, MD  05/20/2017, 9:25 AM

## 2017-05-20 NOTE — Progress Notes (Signed)
PHARMACY NOTE -  Re: metronidazole  Pharmacy has been assisting with dosing of metronidazole for suspected intra-abd infection. Dosage remains stable at 500 mg IV q8h and need for further dosage adjustment appears unlikely at present.    Will sign off at this time.  Please reconsult if a change in clinical status warrants re-evaluation of dosage.  Dia Sitter, PharmD, BCPS 05/20/2017 1:12 PM

## 2017-05-20 NOTE — Progress Notes (Signed)
  Speech Language Pathology Treatment: Dysphagia  Patient Details Name: Sydney Blair MRN: [id number] DOB: [birth year] Today's Date: 05/20/2017 Time: 1204-1227 SLP Time Calculation (min) (ACUTE ONLY): 23 min  Assessment / Plan / Recommendation Clinical Impression  Pt seen to assure tolerance of po diet.  Intake listed as 100% and pt sitting upright with much improved mentation.  Observed pt with thin, puree, soft solids with her self feeding.  No indication of airway compromise.  Using teach back educated pt to aspiration/esophageal precautions.  Pt able to state swallow precautions = at end of session independently.  She denies difficulties with solids today as she has her upper dentures.  Will maintain dys3/thin diet secondary to pt weakness and dentition issue.  No slp follow up needed.   HPI HPI: 62 yo female with h/o smoking, COPD, dysphagia dating back to at least 2012, admitted with n/v/diarrhea - suffered respiratory failure and had to be intubated - Pt self extubated this am - She has h/o squamous cell lung carcinoma and CXR showed right upper lobe airspace disease consistent with pna.  Swallow evaluation ordered.       SLP Plan  All goals met       Recommendations  Diet recommendations: Dysphagia 3 (mechanical soft);Thin liquid Medication Administration: Whole meds with puree (follow with liquids) Supervision: Patient able to self feed Compensations: Slow rate;Small sips/bites (small frequent meals preferred, drink liquid t/o meal) Postural Changes and/or Swallow Maneuvers: Seated upright 90 degrees;Upright 30-60 min after meal                Oral Care Recommendations: Oral care QID Follow up Recommendations:  (tbd) SLP Visit Diagnosis: Dysphagia, oropharyngeal phase (R13.12) Plan: All goals met       GO               Sydney Salk, MS [facility] SLP [phone]  Sydney Blair 05/20/2017, 12:31 PM

## 2017-05-20 NOTE — Care Management Note (Signed)
Case Management Note  Patient Details  Name: Sydney Blair MRN: [id number] Date of Birth: 10-09-1955  Subjective/Objective:                  Staphylococcus aureus pneumonia  Action/Plan: Date:  May 20, 2017 Chart reviewed for concurrent status and case management needs.  Will continue to follow patient progress.  Discharge Planning: following for needs  Expected discharge date: May 23, 2017  Velva Harman, BSN, [place], [place]   Expected Discharge Date:   (unknown)               Expected Discharge Plan:  Home/Self Care  In-House Referral:     Discharge planning Services  CM Consult  Post Acute Care Choice:    Choice offered to:     DME Arranged:    DME Agency:     HH Arranged:    HH Agency:     Status of Service:  In process, will continue to follow  If discussed at Long Length of Stay Meetings, dates discussed:    Additional Comments:  Leeroy Cha, RN 05/20/2017, 9:19 AM

## 2017-05-21 LAB — CULTURE, BLOOD (SINGLE): CULTURE: NO GROWTH

## 2017-05-21 LAB — CBC
HEMATOCRIT: 26.3 % — AB (ref 36.0–46.0)
Hemoglobin: 8.5 g/dL — ABNORMAL LOW (ref 12.0–15.0)
MCH: 29.8 pg (ref 26.0–34.0)
MCHC: 32.3 g/dL (ref 30.0–36.0)
MCV: 92.3 fL (ref 78.0–100.0)
Platelets: 43 10*3/uL — ABNORMAL LOW (ref 150–400)
RBC: 2.85 MIL/uL — ABNORMAL LOW (ref 3.87–5.11)
RDW: 23.4 % — AB (ref 11.5–15.5)
WBC: 4.9 10*3/uL (ref 4.0–10.5)

## 2017-05-21 LAB — COMPREHENSIVE METABOLIC PANEL
ALBUMIN: 2.7 g/dL — AB (ref 3.5–5.0)
ALT: 106 U/L — ABNORMAL HIGH (ref 14–54)
ANION GAP: 8 (ref 5–15)
AST: 29 U/L (ref 15–41)
Alkaline Phosphatase: 77 U/L (ref 38–126)
BUN: 9 mg/dL (ref 6–20)
CALCIUM: 8.7 mg/dL — AB (ref 8.9–10.3)
CO2: 32 mmol/L (ref 22–32)
CREATININE: 0.59 mg/dL (ref 0.44–1.00)
Chloride: 101 mmol/L (ref 101–111)
GFR calc Af Amer: >60 mL/min (ref >60)
Glucose, Bld: 103 mg/dL — ABNORMAL HIGH (ref 65–99)
POTASSIUM: 3.8 mmol/L (ref 3.5–5.1)
Sodium: 141 mmol/L (ref 135–145)
Total Bilirubin: 0.7 mg/dL (ref 0.3–1.2)
Total Protein: 6.2 g/dL — ABNORMAL LOW (ref 6.5–8.1)

## 2017-05-21 LAB — MAGNESIUM: MAGNESIUM: 1.1 mg/dL — AB (ref 1.7–2.4)

## 2017-05-21 LAB — GLUCOSE, CAPILLARY: GLUCOSE-CAPILLARY: 106 mg/dL — AB (ref 65–99)

## 2017-05-21 LAB — PROTIME-INR
INR: 1.01
PROTHROMBIN TIME: 13.2 s (ref 11.4–15.2)

## 2017-05-21 MED ORDER — FERROUS SULFATE 325 (65 FE) MG PO TABS: 325.0000 mg | ORAL_TABLET | Freq: Two times a day (BID) | ORAL | 0 refills | Status: DC

## 2017-05-21 MED ORDER — VORTIOXETINE HBR 10 MG PO TABS: 10.0000 mg | ORAL_TABLET | Freq: Every day | ORAL | Status: DC

## 2017-05-21 MED ORDER — MAGNESIUM OXIDE 400 (241.3 MG) MG PO TABS: 400.0000 mg | ORAL_TABLET | Freq: Two times a day (BID) | ORAL | 0 refills | Status: DC

## 2017-05-21 MED ORDER — GABAPENTIN 400 MG PO CAPS: 400.0000 mg | ORAL_CAPSULE | Freq: Three times a day (TID) | ORAL | 0 refills | Status: DC

## 2017-05-21 MED ORDER — METOPROLOL TARTRATE 50 MG PO TABS: 50.0000 mg | ORAL_TABLET | Freq: Three times a day (TID) | ORAL | 0 refills | Status: DC

## 2017-05-21 MED ORDER — LEVALBUTEROL HCL 0.63 MG/3ML IN NEBU: 0.6300 mg | INHALATION_SOLUTION | Freq: Three times a day (TID) | RESPIRATORY_TRACT | 12 refills | Status: DC

## 2017-05-21 MED ORDER — MAGNESIUM OXIDE 400 (241.3 MG) MG PO TABS
400.0000 mg | ORAL_TABLET | Freq: Two times a day (BID) | ORAL | Status: DC
  Administered 2017-05-21: 400 mg via ORAL
  Filled 2017-05-21: qty 1

## 2017-05-21 MED ORDER — VORTIOXETINE HBR 5 MG PO TABS
10.0000 mg | ORAL_TABLET | Freq: Every day | ORAL | Status: DC
  Administered 2017-05-21: 10 mg via ORAL
  Filled 2017-05-21: qty 2

## 2017-05-21 MED ORDER — DOXYCYCLINE HYCLATE 100 MG PO TABS: 100.0000 mg | ORAL_TABLET | Freq: Two times a day (BID) | ORAL | 0 refills | Status: DC

## 2017-05-21 MED ORDER — MAGNESIUM SULFATE 2 GM/50ML IV SOLN
2.0000 g | Freq: Once | INTRAVENOUS | Status: AC
  Administered 2017-05-21: 2 g via INTRAVENOUS
  Filled 2017-05-21: qty 50

## 2017-05-21 MED ORDER — TAPENTADOL HCL 100 MG PO TABS: 100.0000 mg | ORAL_TABLET | Freq: Every morning | ORAL | 0 refills | Status: DC

## 2017-05-21 NOTE — Discharge Summary (Addendum)
Physician Discharge Summary  KORTNEY POTVIN FKC:[id number] DOB: 1956-05-10 DOA: 05/16/2017  PCP: [name], MD  Admit date: 05/16/2017 Discharge date: 05/21/2017  Admitted From: Home  Disposition:  Home   Recommendations for Outpatient Follow-up:  1. Follow up with PCP in 1-2 weeks 2. Please obtain BMP/CBC in one week 3. Please follow up on the following pending results: autoimmune work up.     Discharge Condition Stable.  CODE STATUS: Full code.  Diet recommendation: Heart Healthy   Brief/Interim Summary: 61 y.o.female currently undergoing treatment for squamous cell carcinoma the lung. Presenting with a two-week history of nausea with vomiting and diarrhea. Patient developed worsening mentation with delirium in the emergency department shortly after admission. Ultimately received IV Ativan and had worsening hypercarbic respiratory failure and with decreased mentation required endotracheal intubation.   Assessment & Plan:   Principal Problem:   Hepatitis Active Problems:   TOBACCO ABUSE   GERD   Hypokalemia   Nausea vomiting and diarrhea   SIRS (systemic inflammatory response syndrome) (HCC)   COPD (chronic obstructive pulmonary disease) (HCC)   Abnormal CXR   Anemia   Thrombocytopenia (HCC)   Depression   Anxiety   Chronic pain   Acute on chronic respiratory failure (HCC)   Pressure injury of skin   1-Acute encephalopathy; related to infection, hypercapnia and sedative. Resolved.   Acute hypoxic and hypercarbic respiratory failure: COPD: No signs of acute exacerbation. 09/24 - Admit & transferred to ICU with worsening mentation post Ativan >> intubated 09/26 - Self extubated Improving. Continue with nebulizer,  Pulmicort.  Stable.   Sepsis: Suspect staph aureus postobstructive pneumonia verrsus diarrheal illness Related to PNA.  Change Levaquin to Doxy due to QT prolongation.   Staphylococcus aureus pneumonia Change Levaquin to Doxy due to  QT prolongation.  Discharge on Doxy for 7 days.   Elevated troponin I: Suspect demand ischemia.  Stress test outpatient.   SVT; on metoprolol. Replaced electrolytes.   Hypomagnesemia; IV mg, and oral.   Transaminates; Autoimmune workup pending  Nausea/vomiting/diarrhea: Resolved. Question possible gastroenteritis.  Improved. Will stop flagyl;.    History of anxiety/depression: Continuing home Cymbalta .Holding nocturnal Pamelor.  Fibromyalgia/neuropathy: Resume  Neurontin lower dose.   Anemia: No signs of active bleeding. Hemoglobin stable. Trending cell counts daily with CBC. Started ferrous sulfate. HB stable.   Thrombocytopenia: Platelet count low at 43. No evidence of bleeding. Close follow up with oncologist.   squamous cell carcinoma the lung; needs to follow up with primary oncologist   Pressure Injury 05/17/17 Stage II Buttocks - Partial thickness loss of dermis presenting as a shallow open ulcer with a red, pink wound bed without slough. local care. Present on admission    Discharge Diagnoses:  Principal Problem:   Hepatitis Active Problems:   TOBACCO ABUSE   GERD   Hypokalemia   Nausea vomiting and diarrhea   SIRS (systemic inflammatory response syndrome) (HCC)   COPD (chronic obstructive pulmonary disease) (HCC)   Abnormal CXR   Anemia   Thrombocytopenia (HCC)   Depression   Anxiety   Chronic pain   Acute on chronic respiratory failure (HCC)   Pressure injury of skin    Discharge Instructions  Discharge Instructions    Diet - low sodium heart healthy    Complete by:  As directed    Increase activity slowly    Complete by:  As directed      Allergies as of 05/21/2017      Reactions   Penicillins Anaphylaxis,  Other (See Comments)   Has patient had a PCN reaction causing immediate rash, facial/tongue/throat swelling, SOB or lightheadedness with hypotension:Yes Has patient had a PCN reaction causing severe rash involving mucus  membranes or skin necrosis:Yes Has patient had a PCN reaction that required hospitalization:No Has patient had a PCN reaction occurring within the last 10 years:No If all of the above answers are "NO", then may proceed with Cephalosporin use.      Medication List    STOP taking these medications   cetirizine 10 MG tablet Commonly known as:  ZYRTEC   gabapentin 800 MG tablet Commonly known as:  NEURONTIN Replaced by:  gabapentin 400 MG capsule   meclizine 25 MG tablet Commonly known as:  ANTIVERT   methocarbamol 500 MG tablet Commonly known as:  ROBAXIN   nortriptyline 25 MG capsule Commonly known as:  PAMELOR   ondansetron 8 MG tablet Commonly known as:  ZOFRAN   Potassium Chloride ER 20 MEQ Tbcr   pravastatin 40 MG tablet Commonly known as:  PRAVACHOL   prochlorperazine 10 MG tablet Commonly known as:  COMPAZINE   sucralfate 1 g tablet Commonly known as:  CARAFATE   sucralfate 1 GM/10ML suspension Commonly known as:  CARAFATE   zolpidem 10 MG tablet Commonly known as:  AMBIEN     TAKE these medications   albuterol 108 (90 Base) MCG/ACT inhaler Commonly known as:  PROVENTIL HFA;VENTOLIN HFA Inhale 2 puffs into the lungs every 6 (six) hours as needed for wheezing or shortness of breath.   aspirin 81 MG chewable tablet Chew 81 mg by mouth daily.   budesonide-formoterol 160-4.5 MCG/ACT inhaler Commonly known as:  SYMBICORT Inhale 2 puffs into the lungs daily.   CALCIUM PO Take 1 tablet by mouth daily.   dexlansoprazole 60 MG capsule Commonly known as:  DEXILANT Take 1 capsule (60 mg total) by mouth daily before breakfast.   diclofenac sodium 1 % Gel Commonly known as:  VOLTAREN Apply 4 g topically 4 (four) times daily.   doxycycline 100 MG tablet Commonly known as:  VIBRA-TABS Take 1 tablet (100 mg total) by mouth every 12 (twelve) hours.   DULoxetine 60 MG capsule Commonly known as:  CYMBALTA Take 60 mg by mouth daily.   ferrous sulfate 325  (65 FE) MG tablet Take 1 tablet (325 mg total) by mouth 2 (two) times daily with a meal.   fluticasone 50 MCG/ACT nasal spray Commonly known as:  FLONASE Place 2 sprays into both nostrils daily.   gabapentin 400 MG capsule Commonly known as:  NEURONTIN Take 1 capsule (400 mg total) by mouth 3 (three) times daily. Replaces:  gabapentin 800 MG tablet   levalbuterol 0.63 MG/3ML nebulizer solution Commonly known as:  XOPENEX Take 3 mLs (0.63 mg total) by nebulization 3 (three) times daily.   levothyroxine 100 MCG tablet Commonly known as:  SYNTHROID, LEVOTHROID Take 100 mcg by mouth daily before breakfast. Reported on 02/03/2016   magnesium oxide 400 (241.3 Mg) MG tablet Commonly known as:  MAG-OX Take 1 tablet (400 mg total) by mouth 2 (two) times daily.   metoprolol tartrate 50 MG tablet Commonly known as:  LOPRESSOR Take 1 tablet (50 mg total) by mouth 3 (three) times daily.   olopatadine 0.1 % ophthalmic solution Commonly known as:  PATANOL Place 1 drop into both eyes 2 (two) times daily.   SONAFINE Apply 1 application topically 3 (three) times daily.   Tapentadol HCl 100 MG Tabs Commonly known as:  NUCYNTA  Take 1 tablet (100 mg total) by mouth every morning. What changed:  when to take this   tiotropium 18 MCG inhalation capsule Commonly known as:  SPIRIVA Place 1 capsule (18 mcg total) into inhaler and inhale daily.   TRINTELLIX 10 MG Tabs Generic drug:  vortioxetine HBr Take 10 mg by mouth daily.            Discharge Care Instructions        Start     Ordered   05/21/17 0000  doxycycline (VIBRA-TABS) 100 MG tablet  Every 12 hours     05/21/17 1207   05/21/17 0000  ferrous sulfate 325 (65 FE) MG tablet  2 times daily with meals     05/21/17 1207   05/21/17 0000  gabapentin (NEURONTIN) 400 MG capsule  3 times daily     05/21/17 1207   05/21/17 0000  levalbuterol (XOPENEX) 0.63 MG/3ML nebulizer solution  3 times daily     05/21/17 1207   05/21/17 0000   magnesium oxide (MAG-OX) 400 (241.3 Mg) MG tablet  2 times daily     05/21/17 1207   05/21/17 0000  metoprolol tartrate (LOPRESSOR) 50 MG tablet  3 times daily     05/21/17 1207   05/21/17 0000  Tapentadol HCl (NUCYNTA) 100 MG TABS   Every morning - 10a     05/21/17 1207   05/21/17 0000  Increase activity slowly     05/21/17 1207   05/21/17 0000  Diet - low sodium heart healthy     05/21/17 1207      Allergies  Allergen Reactions  . Penicillins Anaphylaxis and Other (See Comments)    Has patient had a PCN reaction causing immediate rash, facial/tongue/throat swelling, SOB or lightheadedness with hypotension:Yes Has patient had a PCN reaction causing severe rash involving mucus membranes or skin necrosis:Yes Has patient had a PCN reaction that required hospitalization:No Has patient had a PCN reaction occurring within the last 10 years:No If all of the above answers are "NO", then may proceed with Cephalosporin use.     Consultations:  CCM     Procedures/Studies: Ct Head Wo Contrast  Result Date: 05/16/2017 CLINICAL DATA:  Altered mental status EXAM: CT HEAD WITHOUT CONTRAST TECHNIQUE: Contiguous axial images were obtained from the base of the skull through the vertex without intravenous contrast. COMPARISON:  Brain MRI 03/18/2017 FINDINGS: Brain: No mass lesion, intraparenchymal hemorrhage or extra-axial collection. No evidence of acute cortical infarct. Hypoattenuation in the right frontal white matter likely corresponds to prior infarct. There is periventricular hypoattenuation compatible with chronic microvascular disease. Vascular: No hyperdense vessel or unexpected calcification. Skull: Normal visualized skull base, calvarium and extracranial soft tissues. 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 Sinuses/Orbits: No sinus fluid levels or advanced mucosal thickening. No mastoid effusion. Normal orbits. IMPRESSION: 1. No acute intracranial abnormality. 2. 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 Electronically Signed   By: Ulyses Jarred M.D.   On: 05/16/2017 22:57   Ct Abdomen Pelvis W Contrast  Result Date: 05/16/2017 CLINICAL DATA:  Diarrhea. Elevated liver enzymes. Lung cancer patient with ongoing chemotherapy. EXAM: CT ABDOMEN AND PELVIS WITH CONTRAST TECHNIQUE: Multidetector CT imaging of the abdomen and pelvis was performed using the standard protocol following bolus administration of intravenous contrast. CONTRAST:  100 cc Isovue-300 IV COMPARISON:  PET CT 02/11/2017 FINDINGS: Intubated patient, motion artifact despite sedation. Lower chest: Breathing motion artifact. Mild dependent atelectasis and possible trace pleural effusions. Hepatobiliary: No focal hepatic lesion. Mild  gallbladder distention without calcified gallstone. No gross biliary dilatation, motion obscures detailed evaluation. Pancreas: No ductal dilatation. No gross peripancreatic inflammation, motion limited exam. Spleen: Normal in size without focal abnormality. Adrenals/Urinary Tract: No adrenal nodule. Left hydronephrosis and dilatation of the renal pelvis, as seen on prior PET. No obstructing stone. The ureter appears decompressed. Slight diminished left renal enhancement and excretion. No right hydronephrosis. Calcification in the mid upper right kidney appears vascular. Urinary bladder is distended without wall thickening. Stomach/Bowel: Enteric tube in place with tip in the duodenum. Stomach physiologically distended with gastric contents. No gastric wall thickening. Bowel evaluation limited by motion artifact. Liquid stool in the ascending colon without colonic wall thickening. Air-filled nondilated descending and sigmoid colon. No evidence of colonic inflammatory change. Appendix appears normal. Vascular/Lymphatic: Aortic atherosclerosis without aneurysm. Limited assessment for adenopathy given motion. No bulky adenopathy is seen. Reproductive: 6 cm uterine fibroid causing leftward  uterine deviation, similar to prior PET. No adnexal mass, ovaries tentatively identified and quiescent. Other: No evidence of free air allowing for motion. Small amount of free fluid in the pelvis, nonspecific. Possible trace perihepatic ascites. Musculoskeletal: Anterolisthesis of L5 on S1 with degenerative disc disease and facet arthropathy. Milder degenerative change throughout the remainder of the lower thoracic and lumbar spine. No evidence of focal bone lesion. Remote right rib fractures. IMPRESSION: 1. No evidence of colonic inflammation to suggest colitis. Minimal liquid stool in the ascending colon which can be seen with diarrheal process. Motion limits detailed assessment despite sedation. 2. Mild gallbladder distention without calcified gallstone. 3. 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 4. Distended urinary bladder without wall thickening. 5. Uterine fibroid. 6.  Aortic Atherosclerosis (ICD10-I70.0). Electronically Signed   By: Jeb Levering M.D.   On: 05/16/2017 23:01   Dg Chest Port 1 View  Result Date: 05/18/2017 CLINICAL DATA:  Hypoxia EXAM: PORTABLE CHEST 1 VIEW COMPARISON:  May 17, 2017 FINDINGS: Endotracheal tube tip is 3.1 cm above the carina. Nasogastric tube tip and side port below the diaphragm. No evident pneumothorax. There is persistent airspace opacity in the periphery of the right upper lobe, likely pneumonia. There is mild bibasilar atelectatic change. Lungs elsewhere are clear. Heart is mildly enlarged with pulmonary vascularity within normal limits. No adenopathy. There is aortic atherosclerosis. There is an old healed fracture of the right posterior eighth rib. There are surgical clips in the right axillary region. IMPRESSION: Tube positions as described without pneumothorax. Persistent patchy airspace consolidation right upper lobe consistent with pneumonia, stable. Mild bibasilar atelectasis. Stable cardiac silhouette. There is aortic  atherosclerosis. Aortic Atherosclerosis (ICD10-I70.0). Electronically Signed   By: Lowella Grip III M.D.   On: 05/18/2017 07:08   Dg Chest Port 1 View  Result Date: 05/17/2017 CLINICAL DATA:  Endotracheal tube positioning EXAM: PORTABLE CHEST 1 VIEW COMPARISON:  Yesterday FINDINGS: Endotracheal tube tip is 1 cm above the carina. An orogastric tube reaches the stomach. Right upper lobe airspace disease is mildly improved from yesterday. Worsening aeration at the bases with indistinct diaphragm. This is likely atelectasis. Differences in patient positioning could contribute. No effusion or pneumothorax. Normal heart size and mediastinal contours. IMPRESSION: 1. Lower endotracheal tube, tip 1 cm above the carina. 2. Mild improvement in right upper lobe pneumonia. 3. Worsening aeration at the bases favoring atelectasis. Electronically Signed   By: Monte Fantasia M.D.   On: 05/17/2017 07:23   Dg Chest Portable 1 View  Result Date: 05/16/2017 CLINICAL DATA:  Intubation and gastric tube  placement. Patient with lung cancer on chemoradiation therapy. EXAM: PORTABLE CHEST 1 VIEW COMPARISON:  CXR from 1507 hours FINDINGS: The heart size and mediastinal contours are within normal limits. Right upper lobe opacities are again visualized with differential considerations that may also include post treatment change, pneumonia and/or atelectasis among some possibilities though not exclusive. Mild fullness of the right hilum as before. The tip of an endotracheal tube is 3.5 cm above the carina in satisfactory position. A gastric tube is seen extending below the left hemidiaphragm in the expected location of the stomach with the tip is excluded on current exam. Heart is borderline enlarged. Minimal aortic atherosclerosis at the arch. IMPRESSION: 1. Satisfactory support line and tube positions to the extent visualized. 2. Patchy airspace opacities in the right upper lobe persists without change. Electronically Signed   By:  Ashley Royalty M.D.   On: 05/16/2017 21:51   Dg Chest Port 1 View  Result Date: 05/16/2017 CLINICAL DATA:  61 year old female with weakness, tachycardia and diarrhea. Patient with lung cancer on chemoradiation therapy. EXAM: PORTABLE CHEST 1 VIEW COMPARISON:  02/11/2017 head CT. 12/08/2016 and prior chest radiographs FINDINGS: The cardiomediastinal silhouette is unchanged with right hilar fullness. Increased opacity within the peripheral right upper lobe noted. The left lung is clear. No pleural effusion or pneumothorax identified. The right upper lobe mass identified on 12/08/2016 is much less conspicuous. IMPRESSION: Increased opacity within the peripheral right upper lobe which may be related to treatment changes/atelectasis, but pneumonia/ pneumonitis or less likely metastatic disease is not excluded. Electronically Signed   By: Margarette Canada M.D.   On: 05/16/2017 15:23   Dg Swallowing Func-speech Pathology  Result Date: 05/19/2017 Objective Swallowing Evaluation: Type of Study: MBS-Modified Barium Swallow Study Patient Details Name: ELLIANNE GOWEN MRN: [id number] Date of Birth: 1955/10/15 Today's Date: 05/19/2017 Time: SLP Start Time (ACUTE ONLY): 1015-SLP Stop Time (ACUTE ONLY): 1052 SLP Time Calculation (min) (ACUTE ONLY): 37 min Past Medical History: Past Medical History: Diagnosis Date . Anxiety  . ARF (acute renal failure) ([place]) 08/07/2011  ?  Marland Kitchen Arthritis  . Cancer ([place]) 2005  breast-right . Chronic back pain  . COPD (chronic obstructive pulmonary disease) ([place])  . Depression  . Depression with anxiety  . Dyspnea   on exertion . Fibromyalgia  . GERD (gastroesophageal reflux disease)  . Hypercholesteremia  . Hypothyroidism  . Incontinence  . Neuropathy  . Peripheral neuropathy  . Sciatic pain  . Sebaceous cyst   lt labial location . Sleep apnea   uses CPAP . Stage III squamous cell carcinoma of right lung ([place]) 03/10/2017 . Tobacco abuse  Past Surgical History: Past Surgical History: Procedure  Laterality Date . bladder tack   . BREAST BIOPSY Right 12/29/2016  Procedure: RIGHT BREAST BIOPSY;  Surgeon: Aviva Signs, MD;  Location: [facility];  Service: General;  Laterality: Right; . BREAST LUMPECTOMY    right with node dissection . COLONOSCOPY  11/11/2010  GEX:[id number] rectal polyp and splenic flexure otherwise normal; pathology showed  tubular adenomas. Next TCS 10/2015. Marland Kitchen ESOPHAGOGASTRODUODENOSCOPY  11/11/2010  NUU:VOZDG hiatal hernia, 8 French dilator . ESOPHAGOGASTRODUODENOSCOPY (EGD) WITH ESOPHAGEAL DILATION N/A 06/21/2013  UYQ:[id number] esophagus  - status post Maloney dilation s/p bx (benign esophageal bx) . VIDEO BRONCHOSCOPY WITH ENDOBRONCHIAL ULTRASOUND N/A 02/28/2017  Procedure: VIDEO BRONCHOSCOPY WITH ENDOBRONCHIAL ULTRASOUND;  Surgeon: Juanito Doom, MD;  Location: [facility];  Service: Thoracic;  Laterality: N/A; HPI: 61 yo female with h/o smoking, COPD, dysphagia dating back to at least 2012,  admitted with n/v/diarrhea - suffered respiratory failure and had to be intubated - Pt self extubated this am - She has h/o squamous cell lung carcinoma and CXR showed right upper lobe airspace disease consistent with pna.  Swallow evaluation ordered.  Subjective: pt in bed, sleepy but participative Assessment / Plan / Recommendation CHL IP CLINICAL IMPRESSIONS 05/19/2017 Clinical Impression Mild oropharyngeal dysphagia mostly consistent with MBS from 03/2013 - with decreased oral coordination, transiting - most notably with solids. Premature spillage with liquids apparent which contributes to decreased timing of laryngeal closure resulting in laryngeal penetration of nectar and penetration and trace aspiration x1 of thin.  Pt did not cough with aspiration but cued cough effective to clear secretions.  Chin tuck did not prevent penetration but most penetrates were cleared with same swallow.  Pharyngeal swallow is strong without pharyngeal residual across all consistencies.  She piecemeals with solids/purees - and  currently does not have dentures at the hospital.   Pt's primary source of dysphagia appears to be esophageal - please note prior esopahgram showing age related dysmotility and ? small hiatal hernia.  Barium tablet given with pudding appeared to lodge at mid-esophagus without pt sensation.  Liquid wash did not clear tablet and resulted in retrograde propulsion of liquid.  Large bolus of pudding eventually forced tablet into stomach.  Suspect baseline dysmotility may be exacerbated due to pt's acute illness.  Unfortunately at this time, pt is not sensate to esophageal residuals nor backflow.  Suspect primary esophageal deficits for this pt.  Aspiration of thin was minimal and not present with small single boluses. Recommend strict esophageal strategies for maximal airway protection.    SLP Visit Diagnosis Dysphagia, oropharyngeal phase (R13.12);Dysphagia, unspecified (R13.10) Attention and concentration deficit following -- Frontal lobe and executive function deficit following -- Impact on safety and function Mild aspiration risk   CHL IP TREATMENT RECOMMENDATION 05/19/2017 Treatment Recommendations Therapy as outlined in treatment plan below   Prognosis 05/19/2017 Prognosis for Safe Diet Advancement Fair Barriers to Reach Goals Time post onset Barriers/Prognosis Comment -- CHL IP DIET RECOMMENDATION 05/19/2017 SLP Diet Recommendations Dysphagia 3 (Mech soft) solids;Thin liquid Liquid Administration via Cup;No straw Medication Administration Whole meds with puree Compensations Slow rate;Small sips/bites;Other (Comment) Postural Changes Seated upright at 90 degrees;Remain semi-upright after after feeds/meals (Comment)   CHL IP OTHER RECOMMENDATIONS 05/19/2017 Recommended Consults -- Oral Care Recommendations Oral care BID Other Recommendations --   CHL IP FOLLOW UP RECOMMENDATIONS 05/18/2017 Follow up Recommendations (No Data)   CHL IP FREQUENCY AND DURATION 05/19/2017 Speech Therapy Frequency (ACUTE ONLY) min 1 x/week  Treatment Duration 1 week      CHL IP ORAL PHASE 04/09/2013 Oral Phase -- Oral - Pudding Teaspoon -- Oral - Pudding Cup -- Oral - Honey Teaspoon -- Oral - Honey Cup -- Oral - Nectar Teaspoon -- Oral - Nectar Cup -- Oral - Nectar Straw -- Oral - Thin Teaspoon -- Oral - Thin Cup -- Oral - Thin Straw -- Oral - Puree -- Oral - Mech Soft -- Oral - Regular -- Oral - Multi-Consistency -- Oral - Pill -- Oral Phase - Comment Mild prolonged oral transit with solids  [name], MS [facility] SLP [phone]               US Liver Doppler  Result Date: 05/17/2017 CLINICAL DATA:  Transaminitis EXAM: DUPLEX ULTRASOUND OF LIVER TECHNIQUE: Color and duplex Doppler ultrasound was performed to evaluate the hepatic in-flow and out-flow vessels. COMPARISON:  None. FINDINGS: Portal Vein  Velocities Main:  36.9 cm/sec proximal; 48.1 mid; 35.4 distal Right:  49.4 cm/sec Left:  35.7 cm/sec Flow in the portal vein and its branches is hepatopetal. Hepatic Vein Velocities Right:  38.2 cm/sec Middle:  26.6 cm/sec Left:  41.3 cm/sec. Flow in the hepatic vein and its branches is hepatofugal. Hepatic Artery Velocity:  199.9 cm/sec Splenic Vein Velocity:  26.3 cm/sec Varices: None Ascites: None. There is no splenic or portal vein thrombus or occlusion evident. Spleen measures 11.3 x 12.7 x 5.1 cm with a measured splenic volume of 385 cubic cm. IMPRESSION: The portal vein and branches, hepatic vein and branches, hepatic artery, and splenic vein are all patent with flow in the appropriate anatomic direction. No thrombus or occlusion. No varices or ascites evident. Splenic size within normal limits. Electronically Signed   By: Lowella Grip III M.D.   On: 05/17/2017 13:34   US Abdomen Limited Ruq  Result Date: 05/17/2017 CLINICAL DATA:  Elevated liver enzymes EXAM: ULTRASOUND ABDOMEN LIMITED RIGHT UPPER QUADRANT COMPARISON:  None. FINDINGS: Gallbladder: There is sludge in the gallbladder. There are no echogenic foci in the gallbladder which move  and shadow as is expected with gallstones. No gallbladder wall thickening or pericholecystic fluid. No sonographic Murphy sign noted by sonographer. Common bile duct: Diameter: 4 mm. There is no intrahepatic or extrahepatic biliary duct dilatation. Liver: No focal lesion identified. Within normal limits in parenchymal echogenicity. Portal vein is patent on color Doppler imaging with normal direction of blood flow towards the liver. IMPRESSION: Sludge in gallbladder. No gallstones or gallbladder wall thickening evident. Study otherwise unremarkable. Electronically Signed   By: Lowella Grip III M.D.   On: 05/17/2017 12:57    (Echo, Carotid, EGD, Colonoscopy, ERCP)    Subjective:   Discharge Exam: Vitals:   05/20/17 2105 05/21/17 0531  BP:  (!) 150/75  Pulse:  71  Resp:  18  Temp:  97.6 F (36.4 C)  SpO2: 96% 100%   Vitals:   05/20/17 2053 05/20/17 2105 05/21/17 0318 05/21/17 0531  BP:    (!) 150/75  Pulse:    71  Resp:    18  Temp:    97.6 F (36.4 C)  TempSrc:    Oral  SpO2: 96% 96%  100%  Weight:   71.2 kg (157 lb) 70.9 kg (156 lb 6.4 oz)  Height:        General: Pt is alert, awake, not in acute distress Cardiovascular: RRR, S1/S2 +, no rubs, no gallops Respiratory: CTA bilaterally, no wheezing, no rhonchi Abdominal: Soft, NT, ND, bowel sounds + Extremities: no edema, no cyanosis    The results of significant diagnostics from this hospitalization (including imaging, microbiology, ancillary and laboratory) are listed below for reference.     Microbiology: Recent Results (from the past 240 hour(s))  Culture, blood (single)     Status: None (Preliminary result)   Collection Time: 05/16/17  2:34 PM  Result Value Ref Range Status   Specimen Description BLOOD RIGHT FOREARM  Final   Special Requests   Final    BOTTLES DRAWN AEROBIC AND ANAEROBIC Blood Culture results may not be optimal due to an inadequate volume of blood received in culture bottles   Culture   Final     NO GROWTH 4 DAYS Performed at [facility], [place] [address] [place], [place] [address]    Report Status PENDING  Incomplete  Respiratory Panel by PCR     Status: None   Collection Time:  05/16/17 10:28 PM  Result Value Ref Range Status   Adenovirus NOT DETECTED NOT DETECTED Final   Coronavirus 229E NOT DETECTED NOT DETECTED Final   Coronavirus HKU1 NOT DETECTED NOT DETECTED Final   Coronavirus NL63 NOT DETECTED NOT DETECTED Final   Coronavirus OC43 NOT DETECTED NOT DETECTED Final   Metapneumovirus NOT DETECTED NOT DETECTED Final   Rhinovirus / Enterovirus NOT DETECTED NOT DETECTED Final   Influenza A NOT DETECTED NOT DETECTED Final   Influenza B NOT DETECTED NOT DETECTED Final   Parainfluenza Virus 1 NOT DETECTED NOT DETECTED Final   Parainfluenza Virus 2 NOT DETECTED NOT DETECTED Final   Parainfluenza Virus 3 NOT DETECTED NOT DETECTED Final   Parainfluenza Virus 4 NOT DETECTED NOT DETECTED Final   Respiratory Syncytial Virus NOT DETECTED NOT DETECTED Final   Bordetella pertussis NOT DETECTED NOT DETECTED Final   Chlamydophila pneumoniae NOT DETECTED NOT DETECTED Final   Mycoplasma pneumoniae NOT DETECTED NOT DETECTED Final    Comment: Performed at [facility], [place] [address], [place], [place] [address]  MRSA PCR Screening     Status: None   Collection Time: 05/16/17 11:40 PM  Result Value Ref Range Status   MRSA by PCR NEGATIVE NEGATIVE Final    Comment:        The GeneXpert MRSA Assay (FDA approved for NASAL specimens only), is one component of a comprehensive MRSA colonization surveillance program. It is not intended to diagnose MRSA infection nor to guide or monitor treatment for MRSA infections.   Culture, respiratory (NON-Expectorated)     Status: None   Collection Time: 05/17/17 12:53 AM  Result Value Ref Range Status   Specimen Description TRACHEAL ASPIRATE  Final   Special Requests NONE  Final   Gram Stain   Final    ABUNDANT WBC PRESENT,  PREDOMINANTLY PMN MODERATE GRAM POSITIVE COCCI IN PAIRS IN CLUSTERS MODERATE GRAM NEGATIVE COCCOBACILLI Performed at [facility], [address] [place], [place] [address]    Culture MODERATE STAPHYLOCOCCUS AUREUS  Final   Report Status 05/19/2017 FINAL  Final   Organism ID, Bacteria STAPHYLOCOCCUS AUREUS  Final      Susceptibility   Staphylococcus aureus - MIC*    CIPROFLOXACIN <=0.5 SENSITIVE Sensitive     ERYTHROMYCIN <=0.25 SENSITIVE Sensitive     GENTAMICIN <=0.5 SENSITIVE Sensitive     OXACILLIN 0.5 SENSITIVE Sensitive     TETRACYCLINE <=1 SENSITIVE Sensitive     VANCOMYCIN <=0.5 SENSITIVE Sensitive     TRIMETH/SULFA <=10 SENSITIVE Sensitive     CLINDAMYCIN <=0.25 SENSITIVE Sensitive     RIFAMPIN <=0.5 SENSITIVE Sensitive     Inducible Clindamycin NEGATIVE Sensitive     * MODERATE STAPHYLOCOCCUS AUREUS  Urine culture     Status: None   Collection Time: 05/17/17 12:50 PM  Result Value Ref Range Status   Specimen Description URINE, RANDOM  Final   Special Requests NONE  Final   Culture   Final    NO GROWTH Performed at [facility], [place] [address], [place], [place] [address]    Report Status 05/18/2017 FINAL  Final     Labs: BNP (last 3 results) No results for input(s): BNP in the last 8760 hours. Basic Metabolic Panel:  Recent Labs Lab 05/17/17 0020 05/17/17 0212  05/18/17 0223 05/18/17 2041 05/19/17 0351 05/19/17 1717 05/20/17 0304 05/21/17 0545  NA 141 140  --  139 140 140 138 139 141  K 2.8* 2.9*  --  3.3* 3.9 3.5 3.3* 3.0* 3.8  CL 102 101  --  103 103 100* 99* 99* 101  CO2 28 27  --  27 31 32 31 30 32  GLUCOSE 91 82  --  129* 90 89 90 89 103*  BUN 11 11  --  7 <5* <5* 6 7 9   CREATININE 0.68 0.64  --  0.52 0.50 0.54 0.65 0.60 0.59  CALCIUM 6.9* 6.7*  --  7.3* 7.9* 8.1* 8.3* 8.5* 8.7*  MG 0.9*  1.0* 0.9*  < > 1.5*  --  1.4* 2.3 1.5* 1.1*  PHOS 3.6 3.3  --   --   --  3.4  --  3.4  --   < > = values in this interval not displayed. Liver  Function Tests:  Recent Labs Lab 05/16/17 1429 05/17/17 0020 05/18/17 0223 05/18/17 2041 05/19/17 0351 05/20/17 0304 05/21/17 0545  AST 854* 450* 141* 67*  --   --  29  ALT 594* 432* 299* 245*  --   --  106*  ALKPHOS 106 88 71 86  --   --  77  BILITOT 1.3* 0.8 0.5 0.6  --   --  0.7  PROT 6.8 5.7* 5.5* 6.1*  --   --  6.2*  ALBUMIN 3.1* 2.5* 2.4* 2.5* 2.4* 2.5* 2.7*    Recent Labs Lab 05/16/17 1733  LIPASE 15    Recent Labs Lab 05/16/17 1830 05/17/17 1222  AMMONIA 10 21   CBC:  Recent Labs Lab 05/16/17 1429  05/17/17 0212 05/18/17 0223 05/19/17 0351 05/20/17 0304 05/21/17 0545  WBC 9.3  < > 7.0 4.8 5.3 5.3 4.9  NEUTROABS 8.5*  --   --   --  4.5 4.3  --   HGB 9.0*  < > 7.3* 7.7* 8.8* 8.1* 8.5*  HCT 27.9*  < > 22.8* 24.5* 27.6* 25.3* 26.3*  MCV 88.3  < > 92.3 91.4 93.6 92.3 92.3  PLT 121*  < > 92* 90* 64* 53* 43*  < > = values in this interval not displayed. Cardiac Enzymes:  Recent Labs Lab 05/16/17 1733 05/17/17 0020 05/17/17 0212 05/17/17 1222  CKTOTAL  --  127  --   --   CKMB  --  5.7*  --   --   TROPONINI 0.36* 0.40* 0.36* 0.23*   BNP: Invalid input(s): POCBNP CBG:  Recent Labs Lab 05/17/17 2357 05/18/17 0805 05/19/17 0928 05/20/17 0758 05/21/17 0746  GLUCAP 134* 130* 92 89 106*   D-Dimer No results for input(s): DDIMER in the last 72 hours. Hgb A1c No results for input(s): HGBA1C in the last 72 hours. Lipid Profile No results for input(s): CHOL, HDL, LDLCALC, TRIG, CHOLHDL, LDLDIRECT in the last 72 hours. Thyroid function studies No results for input(s): TSH, T4TOTAL, T3FREE, THYROIDAB in the last 72 hours.  Invalid input(s): FREET3 Anemia work up No results for input(s): VITAMINB12, FOLATE, FERRITIN, TIBC, IRON, RETICCTPCT in the last 72 hours. Urinalysis    Component Value Date/Time   COLORURINE YELLOW 05/16/2017 1410   APPEARANCEUR CLEAR 05/16/2017 1410   LABSPEC 1.004 (L) 05/16/2017 1410   PHURINE 6.0 05/16/2017 1410    GLUCOSEU NEGATIVE 05/16/2017 1410   HGBUR NEGATIVE 05/16/2017 1410   HGBUR negative 02/26/2008 1027   BILIRUBINUR NEGATIVE 05/16/2017 1410   KETONESUR 5 (A) 05/16/2017 1410   PROTEINUR NEGATIVE 05/16/2017 1410   UROBILINOGEN 0.2 06/20/2012 1231   NITRITE NEGATIVE 05/16/2017 White 05/16/2017 1410   Sepsis Labs Invalid  input(s): PROCALCITONIN,  WBC,  LACTICIDVEN Microbiology Recent Results (from the past 240 hour(s))  Culture, blood (single)     Status: None (Preliminary result)   Collection Time: 05/16/17  2:34 PM  Result Value Ref Range Status   Specimen Description BLOOD RIGHT FOREARM  Final   Special Requests   Final    BOTTLES DRAWN AEROBIC AND ANAEROBIC Blood Culture results may not be optimal due to an inadequate volume of blood received in culture bottles   Culture   Final    NO GROWTH 4 DAYS Performed at [facility], [place] [address], [place], [place] [address]    Report Status PENDING  Incomplete  Respiratory Panel by PCR     Status: None   Collection Time: 05/16/17 10:28 PM  Result Value Ref Range Status   Adenovirus NOT DETECTED NOT DETECTED Final   Coronavirus 229E NOT DETECTED NOT DETECTED Final   Coronavirus HKU1 NOT DETECTED NOT DETECTED Final   Coronavirus NL63 NOT DETECTED NOT DETECTED Final   Coronavirus OC43 NOT DETECTED NOT DETECTED Final   Metapneumovirus NOT DETECTED NOT DETECTED Final   Rhinovirus / Enterovirus NOT DETECTED NOT DETECTED Final   Influenza A NOT DETECTED NOT DETECTED Final   Influenza B NOT DETECTED NOT DETECTED Final   Parainfluenza Virus 1 NOT DETECTED NOT DETECTED Final   Parainfluenza Virus 2 NOT DETECTED NOT DETECTED Final   Parainfluenza Virus 3 NOT DETECTED NOT DETECTED Final   Parainfluenza Virus 4 NOT DETECTED NOT DETECTED Final   Respiratory Syncytial Virus NOT DETECTED NOT DETECTED Final   Bordetella pertussis NOT DETECTED NOT DETECTED Final   Chlamydophila pneumoniae NOT DETECTED NOT DETECTED Final    Mycoplasma pneumoniae NOT DETECTED NOT DETECTED Final    Comment: Performed at [facility], [place] [address], [place], [place] [address]  MRSA PCR Screening     Status: None   Collection Time: 05/16/17 11:40 PM  Result Value Ref Range Status   MRSA by PCR NEGATIVE NEGATIVE Final    Comment:        The GeneXpert MRSA Assay (FDA approved for NASAL specimens only), is one component of a comprehensive MRSA colonization surveillance program. It is not intended to diagnose MRSA infection nor to guide or monitor treatment for MRSA infections.   Culture, respiratory (NON-Expectorated)     Status: None   Collection Time: 05/17/17 12:53 AM  Result Value Ref Range Status   Specimen Description TRACHEAL ASPIRATE  Final   Special Requests NONE  Final   Gram Stain   Final    ABUNDANT WBC PRESENT, PREDOMINANTLY PMN MODERATE GRAM POSITIVE COCCI IN PAIRS IN CLUSTERS MODERATE GRAM NEGATIVE COCCOBACILLI Performed at [facility], [address], [place], [place] [address]    Culture MODERATE STAPHYLOCOCCUS AUREUS  Final   Report Status 05/19/2017 FINAL  Final   Organism ID, Bacteria STAPHYLOCOCCUS AUREUS  Final      Susceptibility   Staphylococcus aureus - MIC*    CIPROFLOXACIN <=0.5 SENSITIVE Sensitive     ERYTHROMYCIN <=0.25 SENSITIVE Sensitive     GENTAMICIN <=0.5 SENSITIVE Sensitive     OXACILLIN 0.5 SENSITIVE Sensitive     TETRACYCLINE <=1 SENSITIVE Sensitive     VANCOMYCIN <=0.5 SENSITIVE Sensitive     TRIMETH/SULFA <=10 SENSITIVE Sensitive     CLINDAMYCIN <=0.25 SENSITIVE Sensitive     RIFAMPIN <=0.5 SENSITIVE Sensitive     Inducible Clindamycin NEGATIVE Sensitive     * MODERATE STAPHYLOCOCCUS AUREUS  Urine  culture     Status: None   Collection Time: 05/17/17 12:50 PM  Result Value Ref Range Status   Specimen Description URINE, RANDOM  Final   Special Requests NONE  Final   Culture   Final    NO GROWTH Performed at [facility], [address],  [place], [place] [address]    Report Status 05/18/2017 FINAL  Final     Time coordinating discharge: Over 30 minutes  SIGNED:   Elmarie Shiley, MD  [facility] 05/21/2017, 12:07 PM Pager   If 7PM-7AM, please contact night-coverage [email or web address] Password [email or web address]

## 2017-05-21 NOTE — Care Management Note (Signed)
Case Management Note  Patient Details  Name: Sydney Blair MRN: [id number] Date of Birth: 1956/01/05  Subjective/Objective:      Hepatitis, Nausea and diarrhea, SIRS, COPD              Action/Plan: Discharge Planning: NCM spoke to pt and lives at home with sister. States she has neb machine, oxygen ([employer]), RW and bedside side commode at home. Pt declines HH RN.   PCP Jeanie Cooks, EDWIN   Expected Discharge Date:  05/21/17               Expected Discharge Plan:  Home/Self Care  In-House Referral:  NA  Discharge planning Services  CM Consult  Post Acute Care Choice:  NA Choice offered to:  NA  DME Arranged:  N/A DME Agency:  NA  HH Arranged:  NA HH Agency:  NA  Status of Service:  Completed, signed off  If discussed at [place] of Stay Meetings, dates discussed:    Additional Comments:  Erenest Rasher, RN 05/21/2017, 12:16 PM

## 2017-05-21 NOTE — Progress Notes (Signed)
Went over discharge instructions with patient. Went over prescriptions with patient.IV site was removed.

## 2017-05-23 ENCOUNTER — Ambulatory Visit: Payer: Medicaid Other | Admitting: Acute Care

## 2017-05-25 ENCOUNTER — Ambulatory Visit (INDEPENDENT_AMBULATORY_CARE_PROVIDER_SITE_OTHER): Payer: Medicaid Other | Admitting: Specialist

## 2017-05-26 ENCOUNTER — Ambulatory Visit (INDEPENDENT_AMBULATORY_CARE_PROVIDER_SITE_OTHER): Payer: Medicaid Other | Admitting: Acute Care

## 2017-05-26 ENCOUNTER — Encounter: Payer: Self-pay | Admitting: Acute Care

## 2017-05-26 ENCOUNTER — Ambulatory Visit (INDEPENDENT_AMBULATORY_CARE_PROVIDER_SITE_OTHER)
Admission: RE | Admit: 2017-05-26 | Discharge: 2017-05-26 | Disposition: A | Discharge status: Home / Self Care | Payer: Medicaid Other | Source: Ambulatory Visit | Attending: Acute Care | Admitting: Acute Care

## 2017-05-26 VITALS — BP 124/70 | HR 81 | Ht 60.0 in | Wt 148.0 lb

## 2017-05-26 DIAGNOSIS — J439 Emphysema, unspecified: Secondary | ICD-10-CM | POA: Diagnosis not present

## 2017-05-26 DIAGNOSIS — J189 Pneumonia, unspecified organism: Secondary | ICD-10-CM | POA: Insufficient documentation

## 2017-05-26 DIAGNOSIS — C3491 Malignant neoplasm of unspecified part of right bronchus or lung: Secondary | ICD-10-CM | POA: Diagnosis not present

## 2017-05-26 DIAGNOSIS — J9621 Acute and chronic respiratory failure with hypoxia: Secondary | ICD-10-CM

## 2017-05-26 DIAGNOSIS — F17210 Nicotine dependence, cigarettes, uncomplicated: Secondary | ICD-10-CM

## 2017-05-26 DIAGNOSIS — F17200 Nicotine dependence, unspecified, uncomplicated: Secondary | ICD-10-CM

## 2017-05-26 NOTE — Progress Notes (Signed)
History of Present Illness Sydney Blair is a 61 y.o. 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 Lake Bells in 01/2017 for follow up of abnormal CXR  Synopsis: Referred back to the pulmonary clinic in 2018 for evaluation of an abnormal chest x-ray. Has a history of COPD and was previously seen by Dr. Gwenette Greet.  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  PDL-1 expression was negative.  She was started on 2 L of oxygen continuously in July 2018.  Maintenance Regimen: Symbicort and Spiriva  05/26/2017 Follow Up: Pt. Was seen by Dr. Lake Bells 03/23/2017. She is here for 2 month follow up to allow [place] to keep a closer eye on her while she is  undergoing her chemo/radiation treatments. She was started on small POC oxygen 03/2017.She is doing well with this at present.She is not trying to exert herself. She states she has scant mucus which sometimes has some mucus, and at other times has no color..She is maintained on Symbicort and Spiriva. She states she is compliant.She denies cough. She has had a recent admission to the hospital for pneumonia 9/24-9/29. She was intubated due to worsening mentation after receiving Ativan.She was treated with IV steroids, scheduled BD's, Levaquin switched to Doxy, and a complete course was finished. She finished dosing after discharge. She is using her neb treatments twice daily.She was started on lopressor for blood pressure control. She is compliant with this medication and blood pressure is well controlled today. She states she is still smoking.She has had her flu shot.She completed Chemo ( 9/23) and radiation ( 9/30).She is due for follow up this month with Dr. Earlie Server.   Test Results:   05/26/2017>> CAT Score = 12 Oncology records reviewed, they plan carboplanin and paclitaxel with radiation therapy.  PFT: PFT's 12/2012:  FEV1  1.34 (53%), ratio 63, TLC normal, +airtrapping, DLCO 52%.    Labs: AAT 11/2012:  Normal level, MM   CBC Latest Ref Rng & Units 05/21/2017 05/20/2017 05/19/2017  WBC 4.0 - 10.5 K/uL 4.9 5.3 5.3  Hemoglobin 12.0 - 15.0 g/dL 8.5(L) 8.1(L) 8.8(L)  Hematocrit 36.0 - 46.0 % 26.3(L) 25.3(L) 27.6(L)  Platelets 150 - 400 K/uL 43(L) 53(L) 64(L)    BMP Latest Ref Rng & Units 05/21/2017 05/20/2017 05/19/2017  Glucose 65 - 99 mg/dL 103(H) 89 90  BUN 6 - 20 mg/dL '9 7 6  '$ Creatinine 0.44 - 1.00 mg/dL 0.59 0.60 0.65  Sodium 135 - 145 mmol/L 141 139 138  Potassium 3.5 - 5.1 mmol/L 3.8 3.0(L) 3.3(L)  Chloride 101 - 111 mmol/L 101 99(L) 99(L)  CO2 22 - 32 mmol/L 32 30 31  Calcium 8.9 - 10.3 mg/dL 8.7(L) 8.5(L) 8.3(L)     ProBNP    Component Value Date/Time   PROBNP 531.5 (H) 07/20/2013 1822     Ct Head Wo Contrast  Result Date: 05/16/2017 CLINICAL DATA:  Altered mental status EXAM: CT HEAD WITHOUT CONTRAST TECHNIQUE: Contiguous axial images were obtained from the base of the skull through the vertex without intravenous contrast. COMPARISON:  Brain MRI 03/18/2017 FINDINGS: Brain: No mass lesion, intraparenchymal hemorrhage or extra-axial collection. No evidence of acute cortical infarct. Hypoattenuation in the right frontal white matter likely corresponds to prior infarct. There is periventricular hypoattenuation compatible with chronic microvascular disease. Vascular: No hyperdense vessel or unexpected calcification. Skull: Normal visualized skull base, calvarium and extracranial soft  tissues. 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 Sinuses/Orbits: No sinus fluid levels or advanced mucosal thickening. No mastoid effusion. Normal orbits. IMPRESSION: 1. No acute intracranial abnormality. 2. 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 Electronically Signed   By: Ulyses Jarred M.D.   On: 05/16/2017 22:57   Ct Abdomen Pelvis W Contrast  Result Date: 05/16/2017 CLINICAL DATA:  Diarrhea.  Elevated liver enzymes. Lung cancer patient with ongoing chemotherapy. EXAM: CT ABDOMEN AND PELVIS WITH CONTRAST TECHNIQUE: Multidetector CT imaging of the abdomen and pelvis was performed using the standard protocol following bolus administration of intravenous contrast. CONTRAST:  100 cc Isovue-300 IV COMPARISON:  PET CT 02/11/2017 FINDINGS: Intubated patient, motion artifact despite sedation. Lower chest: Breathing motion artifact. Mild dependent atelectasis and possible trace pleural effusions. Hepatobiliary: No focal hepatic lesion. Mild gallbladder distention without calcified gallstone. No gross biliary dilatation, motion obscures detailed evaluation. Pancreas: No ductal dilatation. No gross peripancreatic inflammation, motion limited exam. Spleen: Normal in size without focal abnormality. Adrenals/Urinary Tract: No adrenal nodule. Left hydronephrosis and dilatation of the renal pelvis, as seen on prior PET. No obstructing stone. The ureter appears decompressed. Slight diminished left renal enhancement and excretion. No right hydronephrosis. Calcification in the mid upper right kidney appears vascular. Urinary bladder is distended without wall thickening. Stomach/Bowel: Enteric tube in place with tip in the duodenum. Stomach physiologically distended with gastric contents. No gastric wall thickening. Bowel evaluation limited by motion artifact. Liquid stool in the ascending colon without colonic wall thickening. Air-filled nondilated descending and sigmoid colon. No evidence of colonic inflammatory change. Appendix appears normal. Vascular/Lymphatic: Aortic atherosclerosis without aneurysm. Limited assessment for adenopathy given motion. No bulky adenopathy is seen. Reproductive: 6 cm uterine fibroid causing leftward uterine deviation, similar to prior PET. No adnexal mass, ovaries tentatively identified and quiescent. Other: No evidence of free air allowing for motion. Small amount of free fluid in the  pelvis, nonspecific. Possible trace perihepatic ascites. Musculoskeletal: Anterolisthesis of L5 on S1 with degenerative disc disease and facet arthropathy. Milder degenerative change throughout the remainder of the lower thoracic and lumbar spine. No evidence of focal bone lesion. Remote right rib fractures. IMPRESSION: 1. No evidence of colonic inflammation to suggest colitis. Minimal liquid stool in the ascending colon which can be seen with diarrheal process. Motion limits detailed assessment despite sedation. 2. Mild gallbladder distention without calcified gallstone. 3. 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 4. Distended urinary bladder without wall thickening. 5. Uterine fibroid. 6.  Aortic Atherosclerosis (ICD10-I70.0). Electronically Signed   By: Jeb Levering M.D.   On: 05/16/2017 23:01   Dg Chest Port 1 View  Result Date: 05/18/2017 CLINICAL DATA:  Hypoxia EXAM: PORTABLE CHEST 1 VIEW COMPARISON:  May 17, 2017 FINDINGS: Endotracheal tube tip is 3.1 cm above the carina. Nasogastric tube tip and side port below the diaphragm. No evident pneumothorax. There is persistent airspace opacity in the periphery of the right upper lobe, likely pneumonia. There is mild bibasilar atelectatic change. Lungs elsewhere are clear. Heart is mildly enlarged with pulmonary vascularity within normal limits. No adenopathy. There is aortic atherosclerosis. There is an old healed fracture of the right posterior eighth rib. There are surgical clips in the right axillary region. IMPRESSION: Tube positions as described without pneumothorax. Persistent patchy airspace consolidation right upper lobe consistent with pneumonia, stable. Mild bibasilar atelectasis. Stable cardiac silhouette. There is aortic atherosclerosis. Aortic Atherosclerosis (ICD10-I70.0). Electronically Signed   By: Lowella Grip III M.D.   On: 05/18/2017 07:08  Dg Chest Port 1 View  Result Date: 05/17/2017 CLINICAL  DATA:  Endotracheal tube positioning EXAM: PORTABLE CHEST 1 VIEW COMPARISON:  Yesterday FINDINGS: Endotracheal tube tip is 1 cm above the carina. An orogastric tube reaches the stomach. Right upper lobe airspace disease is mildly improved from yesterday. Worsening aeration at the bases with indistinct diaphragm. This is likely atelectasis. Differences in patient positioning could contribute. No effusion or pneumothorax. Normal heart size and mediastinal contours. IMPRESSION: 1. Lower endotracheal tube, tip 1 cm above the carina. 2. Mild improvement in right upper lobe pneumonia. 3. Worsening aeration at the bases favoring atelectasis. Electronically Signed   By: Monte Fantasia M.D.   On: 05/17/2017 07:23   Dg Chest Portable 1 View  Result Date: 05/16/2017 CLINICAL DATA:  Intubation and gastric tube placement. Patient with lung cancer on chemoradiation therapy. EXAM: PORTABLE CHEST 1 VIEW COMPARISON:  CXR from 1507 hours FINDINGS: The heart size and mediastinal contours are within normal limits. Right upper lobe opacities are again visualized with differential considerations that may also include post treatment change, pneumonia and/or atelectasis among some possibilities though not exclusive. Mild fullness of the right hilum as before. The tip of an endotracheal tube is 3.5 cm above the carina in satisfactory position. A gastric tube is seen extending below the left hemidiaphragm in the expected location of the stomach with the tip is excluded on current exam. Heart is borderline enlarged. Minimal aortic atherosclerosis at the arch. IMPRESSION: 1. Satisfactory support line and tube positions to the extent visualized. 2. Patchy airspace opacities in the right upper lobe persists without change. Electronically Signed   By: Ashley Royalty M.D.   On: 05/16/2017 21:51   Dg Chest Port 1 View  Result Date: 05/16/2017 CLINICAL DATA:  61 year old female with weakness, tachycardia and diarrhea. Patient with lung cancer  on chemoradiation therapy. EXAM: PORTABLE CHEST 1 VIEW COMPARISON:  02/11/2017 head CT. 12/08/2016 and prior chest radiographs FINDINGS: The cardiomediastinal silhouette is unchanged with right hilar fullness. Increased opacity within the peripheral right upper lobe noted. The left lung is clear. No pleural effusion or pneumothorax identified. The right upper lobe mass identified on 12/08/2016 is much less conspicuous. IMPRESSION: Increased opacity within the peripheral right upper lobe which may be related to treatment changes/atelectasis, but pneumonia/ pneumonitis or less likely metastatic disease is not excluded. Electronically Signed   By: Margarette Canada M.D.   On: 05/16/2017 15:23   Dg Swallowing Func-speech Pathology  Result Date: 05/19/2017 Objective Swallowing Evaluation: Type of Study: MBS-Modified Barium Swallow Study Patient Details Name: Sydney Blair MRN: [id number] Date of Birth: 1956-01-11 Today's Date: 05/19/2017 Time: SLP Start Time (ACUTE ONLY): 1015-SLP Stop Time (ACUTE ONLY): 1052 SLP Time Calculation (min) (ACUTE ONLY): 37 min Past Medical History: Past Medical History: Diagnosis Date . Anxiety  . ARF (acute renal failure) ([place]) 08/07/2011  ?  Marland Kitchen Arthritis  . Cancer ([place]) 2005  breast-right . Chronic back pain  . COPD (chronic obstructive pulmonary disease) ([place])  . Depression  . Depression with anxiety  . Dyspnea   on exertion . Fibromyalgia  . GERD (gastroesophageal reflux disease)  . Hypercholesteremia  . Hypothyroidism  . Incontinence  . Neuropathy  . Peripheral neuropathy  . Sciatic pain  . Sebaceous cyst   lt labial location . Sleep apnea   uses CPAP . Stage III squamous cell carcinoma of right lung ([place]) 03/10/2017 . Tobacco abuse  Past Surgical History: Past Surgical History: Procedure Laterality Date . bladder tack   .  BREAST BIOPSY Right 12/29/2016  Procedure: RIGHT BREAST BIOPSY;  Surgeon: Aviva Signs, MD;  Location: [facility];  Service: General;  Laterality: Right; . BREAST  LUMPECTOMY    right with node dissection . COLONOSCOPY  11/11/2010  AOZ:HYQMVHQION rectal polyp and splenic flexure otherwise normal; pathology showed  tubular adenomas. Next TCS 10/2015. Marland Kitchen ESOPHAGOGASTRODUODENOSCOPY  11/11/2010  GEX:BMWUX hiatal hernia, 68 French dilator . ESOPHAGOGASTRODUODENOSCOPY (EGD) WITH ESOPHAGEAL DILATION N/A 06/21/2013  LKG:MWNUUV esophagus  - status post Maloney dilation s/p bx (benign esophageal bx) . VIDEO BRONCHOSCOPY WITH ENDOBRONCHIAL ULTRASOUND N/A 02/28/2017  Procedure: VIDEO BRONCHOSCOPY WITH ENDOBRONCHIAL ULTRASOUND;  Surgeon: Juanito Doom, MD;  Location: [facility];  Service: Thoracic;  Laterality: N/A; HPI: 61 yo female with h/o smoking, COPD, dysphagia dating back to at least 2012, admitted with n/v/diarrhea - suffered respiratory failure and had to be intubated - Pt self extubated this am - She has h/o squamous cell lung carcinoma and CXR showed right upper lobe airspace disease consistent with pna.  Swallow evaluation ordered.  Subjective: pt in bed, sleepy but participative Assessment / Plan / Recommendation CHL IP CLINICAL IMPRESSIONS 05/19/2017 Clinical Impression Mild oropharyngeal dysphagia mostly consistent with MBS from 03/2013 - with decreased oral coordination, transiting - most notably with solids. Premature spillage with liquids apparent which contributes to decreased timing of laryngeal closure resulting in laryngeal penetration of nectar and penetration and trace aspiration x1 of thin.  Pt did not cough with aspiration but cued cough effective to clear secretions.  Chin tuck did not prevent penetration but most penetrates were cleared with same swallow.  Pharyngeal swallow is strong without pharyngeal residual across all consistencies.  She piecemeals with solids/purees - and currently does not have dentures at the hospital.   Pt's primary source of dysphagia appears to be esophageal - please note prior esopahgram showing age related dysmotility and ? small hiatal  hernia.  Barium tablet given with pudding appeared to lodge at mid-esophagus without pt sensation.  Liquid wash did not clear tablet and resulted in retrograde propulsion of liquid.  Large bolus of pudding eventually forced tablet into stomach.  Suspect baseline dysmotility may be exacerbated due to pt's acute illness.  Unfortunately at this time, pt is not sensate to esophageal residuals nor backflow.  Suspect primary esophageal deficits for this pt.  Aspiration of thin was minimal and not present with small single boluses. Recommend strict esophageal strategies for maximal airway protection.    SLP Visit Diagnosis Dysphagia, oropharyngeal phase (R13.12);Dysphagia, unspecified (R13.10) Attention and concentration deficit following -- Frontal lobe and executive function deficit following -- Impact on safety and function Mild aspiration risk   CHL IP TREATMENT RECOMMENDATION 05/19/2017 Treatment Recommendations Therapy as outlined in treatment plan below   Prognosis 05/19/2017 Prognosis for Safe Diet Advancement Fair Barriers to Reach Goals Time post onset Barriers/Prognosis Comment -- CHL IP DIET RECOMMENDATION 05/19/2017 SLP Diet Recommendations Dysphagia 3 (Mech soft) solids;Thin liquid Liquid Administration via Cup;No straw Medication Administration Whole meds with puree Compensations Slow rate;Small sips/bites;Other (Comment) Postural Changes Seated upright at 90 degrees;Remain semi-upright after after feeds/meals (Comment)   CHL IP OTHER RECOMMENDATIONS 05/19/2017 Recommended Consults -- Oral Care Recommendations Oral care BID Other Recommendations --   CHL IP FOLLOW UP RECOMMENDATIONS 05/18/2017 Follow up Recommendations (No Data)   CHL IP FREQUENCY AND DURATION 05/19/2017 Speech Therapy Frequency (ACUTE ONLY) min 1 x/week Treatment Duration 1 week      CHL IP ORAL PHASE 04/09/2013 Oral Phase -- Oral - Pudding Teaspoon -- Oral -  Pudding Cup -- Oral - Honey Teaspoon -- Oral - Honey Cup -- Oral - Nectar Teaspoon --  Oral - Nectar Cup -- Oral - Nectar Straw -- Oral - Thin Teaspoon -- Oral - Thin Cup -- Oral - Thin Straw -- Oral - Puree -- Oral - Mech Soft -- Oral - Regular -- Oral - Multi-Consistency -- Oral - Pill -- Oral Phase - Comment Mild prolonged oral transit with solids  [name], MS [facility] SLP [phone]               US Liver Doppler  Result Date: 05/17/2017 CLINICAL DATA:  Transaminitis EXAM: DUPLEX ULTRASOUND OF LIVER TECHNIQUE: Color and duplex Doppler ultrasound was performed to evaluate the hepatic in-flow and out-flow vessels. COMPARISON:  None. FINDINGS: Portal Vein Velocities Main:  36.9 cm/sec proximal; 48.1 mid; 35.4 distal Right:  49.4 cm/sec Left:  35.7 cm/sec Flow in the portal vein and its branches is hepatopetal. Hepatic Vein Velocities Right:  38.2 cm/sec Middle:  26.6 cm/sec Left:  41.3 cm/sec. Flow in the hepatic vein and its branches is hepatofugal. Hepatic Artery Velocity:  199.9 cm/sec Splenic Vein Velocity:  26.3 cm/sec Varices: None Ascites: None. There is no splenic or portal vein thrombus or occlusion evident. Spleen measures 11.3 x 12.7 x 5.1 cm with a measured splenic volume of 385 cubic cm. IMPRESSION: The portal vein and branches, hepatic vein and branches, hepatic artery, and splenic vein are all patent with flow in the appropriate anatomic direction. No thrombus or occlusion. No varices or ascites evident. Splenic size within normal limits. Electronically Signed   By: Lowella Grip III M.D.   On: 05/17/2017 13:34   US Abdomen Limited Ruq  Result Date: 05/17/2017 CLINICAL DATA:  Elevated liver enzymes EXAM: ULTRASOUND ABDOMEN LIMITED RIGHT UPPER QUADRANT COMPARISON:  None. FINDINGS: Gallbladder: There is sludge in the gallbladder. There are no echogenic foci in the gallbladder which move and shadow as is expected with gallstones. No gallbladder wall thickening or pericholecystic fluid. No sonographic Murphy sign noted by sonographer. Common bile duct: Diameter: 4 mm. There is  no intrahepatic or extrahepatic biliary duct dilatation. Liver: No focal lesion identified. Within normal limits in parenchymal echogenicity. Portal vein is patent on color Doppler imaging with normal direction of blood flow towards the liver. IMPRESSION: Sludge in gallbladder. No gallstones or gallbladder wall thickening evident. Study otherwise unremarkable. Electronically Signed   By: Lowella Grip III M.D.   On: 05/17/2017 12:57     Past medical hx Past Medical History:  Diagnosis Date  . Anxiety   . ARF (acute renal failure) ([place]) 08/07/2011   ?   Marland Kitchen Arthritis   . Cancer ([place]) 2005   breast-right  . Chronic back pain   . COPD (chronic obstructive pulmonary disease) ([place])   . Depression   . Depression with anxiety   . Dyspnea    on exertion  . Fibromyalgia   . GERD (gastroesophageal reflux disease)   . Hypercholesteremia   . Hypothyroidism   . Incontinence   . Neuropathy   . Peripheral neuropathy   . Sciatic pain   . Sebaceous cyst    lt labial location  . Sleep apnea    uses CPAP  . Stage III squamous cell carcinoma of right lung ([place]) 03/10/2017  . Tobacco abuse      Social History  Substance Use Topics  . Smoking status: Current Every Day Smoker    Packs/day: 1.00    Years: 35.00  Types: Cigarettes  . Smokeless tobacco: Never Used     Comment: has cut back to one pack daily  . Alcohol use No    Sydney Blair reports that she has been smoking Cigarettes.  She has a 35.00 pack-year smoking history. She has never used smokeless tobacco. She reports that she does not drink alcohol or use drugs.  Tobacco Cessation: Current every day smoker  I have spent 3 minutes counseling patient on smoking cessation this visit. We discussed health risks of continued tobacco abuse.  Past surgical hx, Family hx, Social hx all reviewed.  Current Outpatient Prescriptions on File Prior to Visit  Medication Sig  . albuterol (PROVENTIL HFA;VENTOLIN HFA) 108 (90 BASE) MCG/ACT  inhaler Inhale 2 puffs into the lungs every 6 (six) hours as needed for wheezing or shortness of breath.  Marland Kitchen aspirin 81 MG chewable tablet Chew 81 mg by mouth daily.  . budesonide-formoterol (SYMBICORT) 160-4.5 MCG/ACT inhaler Inhale 2 puffs into the lungs daily.   Marland Kitchen CALCIUM PO Take 1 tablet by mouth daily.  Marland Kitchen dexlansoprazole (DEXILANT) 60 MG capsule Take 1 capsule (60 mg total) by mouth daily before breakfast.  . diclofenac sodium (VOLTAREN) 1 % GEL Apply 4 g topically 4 (four) times daily.  Marland Kitchen doxycycline (VIBRA-TABS) 100 MG tablet Take 1 tablet (100 mg total) by mouth every 12 (twelve) hours.  . DULoxetine (CYMBALTA) 60 MG capsule Take 60 mg by mouth daily.  . ferrous sulfate 325 (65 FE) MG tablet Take 1 tablet (325 mg total) by mouth 2 (two) times daily with a meal.  . fluticasone (FLONASE) 50 MCG/ACT nasal spray Place 2 sprays into both nostrils daily.  Marland Kitchen gabapentin (NEURONTIN) 400 MG capsule Take 1 capsule (400 mg total) by mouth 3 (three) times daily.  Marland Kitchen levalbuterol (XOPENEX) 0.63 MG/3ML nebulizer solution Take 3 mLs (0.63 mg total) by nebulization 3 (three) times daily.  Marland Kitchen levothyroxine (SYNTHROID, LEVOTHROID) 100 MCG tablet Take 100 mcg by mouth daily before breakfast. Reported on 02/03/2016  . magnesium oxide (MAG-OX) 400 (241.3 Mg) MG tablet Take 1 tablet (400 mg total) by mouth 2 (two) times daily.  . metoprolol tartrate (LOPRESSOR) 50 MG tablet Take 1 tablet (50 mg total) by mouth 3 (three) times daily.  Marland Kitchen olopatadine (PATANOL) 0.1 % ophthalmic solution Place 1 drop into both eyes 2 (two) times daily.   . Tapentadol HCl (NUCYNTA) 100 MG TABS Take 1 tablet (100 mg total) by mouth every morning.  . tiotropium (SPIRIVA) 18 MCG inhalation capsule Place 1 capsule (18 mcg total) into inhaler and inhale daily.  . TRINTELLIX 10 MG TABS Take 10 mg by mouth daily.  . Wound Dressings (SONAFINE) Apply 1 application topically 3 (three) times daily.   No current facility-administered medications on  file prior to visit.      Allergies  Allergen Reactions  . Penicillins Anaphylaxis and Other (See Comments)    Has patient had a PCN reaction causing immediate rash, facial/tongue/throat swelling, SOB or lightheadedness with hypotension:Yes Has patient had a PCN reaction causing severe rash involving mucus membranes or skin necrosis:Yes Has patient had a PCN reaction that required hospitalization:No Has patient had a PCN reaction occurring within the last 10 years:No If all of the above answers are "NO", then may proceed with Cephalosporin use.     Review Of Systems:  Constitutional:   No  weight loss, night sweats,  Fevers, chills, fatigue, or  lassitude.  HEENT:   No headaches,  Difficulty swallowing,  Tooth/dental problems, or  Sore throat,                No sneezing, itching, ear ache, nasal congestion, post nasal drip,   CV:  No chest pain,  Orthopnea, PND, swelling in lower extremities, anasarca, dizziness, palpitations, syncope.   GI  No heartburn, indigestion, abdominal pain, nausea, vomiting, diarrhea, change in bowel habits, loss of appetite, bloody stools.   Resp: No shortness of breath with exertion or at rest.  No excess mucus, no productive cough,  No non-productive cough,  No coughing up of blood.  No change in color of mucus.  No wheezing.  No chest wall deformity  Skin: no rash or lesions.  GU: no dysuria, change in color of urine, no urgency or frequency.  No flank pain, no hematuria   MS:  No joint pain or swelling.  No decreased range of motion.  No back pain.  Psych:  No change in mood or affect. No depression or anxiety.  No memory loss.   Vital Signs BP 124/70 (BP Location: Left Arm, Cuff Size: Normal)   Pulse 81   Ht 5' (1.524 m)   Wt 148 lb (67.1 kg)   SpO2 98%   BMI 28.90 kg/m    Physical Exam:  General- No distress,  A&Ox3, pleasant ENT: No sinus tenderness, TM clear, pale nasal mucosa, no oral exudate,no post nasal drip, no LAN Cardiac:  S1, S2, regular rate and rhythm, no murmur Chest: No wheeze/ rales/ dullness; no accessory muscle use, no nasal flaring, no sternal retractions Abd.: Soft Non-tender, non-distended, BS + Ext: No clubbing cyanosis, trace lower extremity bilateral edema Neuro:  Deconditioned at baseline. Skin: No rashes, warm and dry Psych: normal mood and behavior   Assessment/Plan  COPD (chronic obstructive pulmonary disease) with emphysema (HCC) Stable interval  Plan: Continue your Symbicort and Spiriva as you have been doing. Remember to rinse mouth after use. Review the Advanced Directive Paperwork you received in the office today Follow up with Dr. Kendrick Fries in 2 months Please contact office for sooner follow up if symptoms do not improve or worsen or seek emergency care .     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 Shirline Frees 06/06/2017  TOBACCO ABUSE Continued tobacco abuse despite lung cancer diagnosis Plan: Please continue to work on quitting smoking. This is the single most powerful action you can take to decrease your rsik of lung problems and heart problems. Review the Advanced Directive Paperwork you received in the office today I have spent 3 minutes counseling patient on smoking cessation this visit. Follow up with Dr. Kendrick Fries in 2 months Please contact office for sooner follow up if symptoms do not improve or worsen or seek emergency care     Acute on chronic respiratory failure (HCC) Continue oxygen at 2 L [place]  Saturation goals are 88-92%  CAP (community acquired pneumonia) Staph Aureus post obstructive pneumonia Completed full coarse of treatment with Levaquin transitioned to doxy( QT prolongation) Plan: CXR today. We will call you with results. Continue your Symbicort and Spiriva as you have been doing. Remember to rinse mouth after use. Follow up with Dr. Shirline Frees 06/06/2017 as is  scheduled. Follow up with cardiology as is scheduled. Please continue to work on quitting smoking. This is the single most powerful action you can take to decrease your rsik of lung problems and heart problems. Review the Advanced Directive Paperwork you received in the office today Follow  up with Dr. Lake Bells in 2 months Please contact office for sooner follow up if symptoms do not improve or worsen or seek emergency care .     I spent 5 minutes of today's visit initiating the conversation about advanced directive planning based on CAT Score of 12. Pt. took the advanced care packet and will start the process and discussion  with her family's input.   Magdalen Spatz, NP 05/26/2017  10:37 AM

## 2017-05-26 NOTE — Assessment & Plan Note (Signed)
Completed Chemo radiation 9/0/2018 Plan: Follow up with Dr. Earlie Server 06/06/2017

## 2017-05-26 NOTE — Assessment & Plan Note (Signed)
Continue oxygen at 2 L [place]  Saturation goals are 88-92%

## 2017-05-26 NOTE — Assessment & Plan Note (Signed)
Stable interval  Plan: Continue your Symbicort and Spiriva as you have been doing. Remember to rinse mouth after use. Review the Advanced Directive Paperwork you received in the office today Follow up with Dr. Lake Bells in 2 months Please contact office for sooner follow up if symptoms do not improve or worsen or seek emergency care .

## 2017-05-26 NOTE — Assessment & Plan Note (Addendum)
Continued tobacco abuse despite lung cancer diagnosis Plan: Please continue to work on quitting smoking. This is the single most powerful action you can take to decrease your rsik of lung problems and heart problems. Review the Advanced Directive Paperwork you received in the office today I have spent 3 minutes counseling patient on smoking cessation this visit. Follow up with Dr. Lake Bells in 2 months Please contact office for sooner follow up if symptoms do not improve or worsen or seek emergency care

## 2017-05-26 NOTE — Patient Instructions (Addendum)
It is nice to meet you today CXR today. We will call you with results. Continue your Symbicort and Spiriva as you have been doing. Remember to rinse mouth after use. Follow up with Dr. Earlie Server 06/06/2017 as is scheduled. Follow up with cardiology as is scheduled. Please continue to work on quitting smoking. This is the single most powerful action you can take to decrease your rsik of lung problems and heart problems. Review the Advanced Directive Paperwork you received in the office today Follow up with Dr. Lake Bells in 2 months Please contact office for sooner follow up if symptoms do not improve or worsen or seek emergency care .

## 2017-05-26 NOTE — Progress Notes (Signed)
Reviewed, agree 

## 2017-05-26 NOTE — Assessment & Plan Note (Signed)
Staph Aureus post obstructive pneumonia Completed full coarse of treatment with Levaquin transitioned to doxy( QT prolongation) Plan: CXR today. We will call you with results. Continue your Symbicort and Spiriva as you have been doing. Remember to rinse mouth after use. Follow up with Dr. Earlie Server 06/06/2017 as is scheduled. Follow up with cardiology as is scheduled. Please continue to work on quitting smoking. This is the single most powerful action you can take to decrease your rsik of lung problems and heart problems. Review the Advanced Directive Paperwork you received in the office today Follow up with Dr. Lake Bells in 2 months Please contact office for sooner follow up if symptoms do not improve or worsen or seek emergency care .

## 2017-05-30 ENCOUNTER — Telehealth: Payer: Self-pay | Admitting: Pulmonary Disease

## 2017-05-30 DIAGNOSIS — J189 Pneumonia, unspecified organism: Secondary | ICD-10-CM

## 2017-05-30 NOTE — Telephone Encounter (Signed)
Notes recorded by Magdalen Spatz, NP on 05/27/2017 at 11:35 AM EDT Please call patient and let her know her CXR indicated improvement but not resolution of her pneumonia. Please have her follow up in 6 weeks ( I think we had done a 2 month follow up). Please have her follow the plan of care developed in the office yesterday. She will need a CXR prior to follow up appointment, please place as stat. Thanks so much.  Advised pt of results. Pt understood and nothing further is needed. Appt made for 6 weeks and chest xray order placed.

## 2017-05-31 ENCOUNTER — Telehealth: Payer: Self-pay | Admitting: Acute Care

## 2017-05-31 MED ORDER — DOXYCYCLINE HYCLATE 100 MG PO TABS: 100.0000 mg | ORAL_TABLET | Freq: Two times a day (BID) | ORAL | 0 refills | Status: DC

## 2017-05-31 NOTE — Telephone Encounter (Signed)
Spoke with patient. She is aware of VS' recs. Doxy has been called into [place]. Nothing else was needed at time of call.

## 2017-05-31 NOTE — Telephone Encounter (Signed)
Please send script for doxycycline 100 mg bid for 7 days.

## 2017-05-31 NOTE — Telephone Encounter (Signed)
Spoke with pt, who is concerned about cxr still showing PNA. Pt is concerned that she should be on an abx, as she finished course of abx last week. Pt states symptoms have improved. Pt reports of prod cough with clear to yellow mucus & sob with exertion.    VS please advise, as BQ and SG are unavailable.

## 2017-06-02 ENCOUNTER — Ambulatory Visit (HOSPITAL_COMMUNITY)
Admission: RE | Admit: 2017-06-02 | Discharge: 2017-06-02 | Disposition: A | Discharge status: Home / Self Care | Payer: Medicaid Other | Source: Ambulatory Visit | Attending: Oncology | Admitting: Oncology

## 2017-06-02 DIAGNOSIS — R918 Other nonspecific abnormal finding of lung field: Secondary | ICD-10-CM | POA: Insufficient documentation

## 2017-06-02 DIAGNOSIS — N1330 Unspecified hydronephrosis: Secondary | ICD-10-CM | POA: Insufficient documentation

## 2017-06-02 DIAGNOSIS — Z5111 Encounter for antineoplastic chemotherapy: Secondary | ICD-10-CM

## 2017-06-02 DIAGNOSIS — C3491 Malignant neoplasm of unspecified part of right bronchus or lung: Secondary | ICD-10-CM | POA: Diagnosis not present

## 2017-06-02 MED ORDER — IOPAMIDOL (ISOVUE-300) INJECTION 61%
INTRAVENOUS | Status: AC
  Filled 2017-06-02: qty 75

## 2017-06-02 MED ORDER — IOPAMIDOL (ISOVUE-300) INJECTION 61%
75.0000 mL | Freq: Once | INTRAVENOUS | Status: AC | PRN
  Administered 2017-06-02: 75 mL via INTRAVENOUS

## 2017-06-03 ENCOUNTER — Other Ambulatory Visit (HOSPITAL_BASED_OUTPATIENT_CLINIC_OR_DEPARTMENT_OTHER): Payer: Medicaid Other

## 2017-06-03 DIAGNOSIS — Z5111 Encounter for antineoplastic chemotherapy: Secondary | ICD-10-CM

## 2017-06-03 DIAGNOSIS — C3491 Malignant neoplasm of unspecified part of right bronchus or lung: Secondary | ICD-10-CM | POA: Diagnosis present

## 2017-06-03 LAB — CBC WITH DIFFERENTIAL/PLATELET
BASO%: 0.4 % (ref 0.0–2.0)
BASOS ABS: 0 10*3/uL (ref 0.0–0.1)
EOS%: 4.7 % (ref 0.0–7.0)
Eosinophils Absolute: 0.2 10*3/uL (ref 0.0–0.5)
HEMATOCRIT: 27.8 % — AB (ref 34.8–46.6)
HEMOGLOBIN: 8.9 g/dL — AB (ref 11.6–15.9)
LYMPH#: 0.9 10*3/uL (ref 0.9–3.3)
LYMPH%: 17.7 % (ref 14.0–49.7)
MCH: 31.8 pg (ref 25.1–34.0)
MCHC: 32 g/dL (ref 31.5–36.0)
MCV: 99.3 fL (ref 79.5–101.0)
MONO#: 0.3 10*3/uL (ref 0.1–0.9)
MONO%: 5.3 % (ref 0.0–14.0)
NEUT#: 3.7 10*3/uL (ref 1.5–6.5)
NEUT%: 71.9 % (ref 38.4–76.8)
NRBC: 0 % (ref 0–0)
Platelets: 114 10*3/uL — ABNORMAL LOW (ref 145–400)
RBC: 2.8 10*6/uL — ABNORMAL LOW (ref 3.70–5.45)
RDW: 23.7 % — AB (ref 11.2–14.5)
WBC: 5.1 10*3/uL (ref 3.9–10.3)

## 2017-06-03 LAB — COMPREHENSIVE METABOLIC PANEL
ALBUMIN: 3.5 g/dL (ref 3.5–5.0)
ALK PHOS: 76 U/L (ref 40–150)
ALT: 9 U/L (ref 0–55)
AST: 13 U/L (ref 5–34)
Anion Gap: 11 mEq/L (ref 3–11)
BILIRUBIN TOTAL: 0.82 mg/dL (ref 0.20–1.20)
BUN: 9.2 mg/dL (ref 7.0–26.0)
CALCIUM: 8.8 mg/dL (ref 8.4–10.4)
CO2: 27 mEq/L (ref 22–29)
Chloride: 103 mEq/L (ref 98–109)
Creatinine: 0.8 mg/dL (ref 0.6–1.1)
GLUCOSE: 96 mg/dL (ref 70–140)
POTASSIUM: 3.1 meq/L — AB (ref 3.5–5.1)
Sodium: 142 mEq/L (ref 136–145)
TOTAL PROTEIN: 7.1 g/dL (ref 6.4–8.3)

## 2017-06-06 ENCOUNTER — Telehealth: Payer: Self-pay

## 2017-06-06 ENCOUNTER — Encounter: Payer: Self-pay | Admitting: Internal Medicine

## 2017-06-06 ENCOUNTER — Ambulatory Visit (HOSPITAL_BASED_OUTPATIENT_CLINIC_OR_DEPARTMENT_OTHER): Payer: Medicaid Other | Admitting: Internal Medicine

## 2017-06-06 VITALS — BP 143/51 | HR 93 | Temp 98.6°F | Resp 18 | Ht 60.0 in | Wt 155.0 lb

## 2017-06-06 DIAGNOSIS — C3491 Malignant neoplasm of unspecified part of right bronchus or lung: Secondary | ICD-10-CM

## 2017-06-06 DIAGNOSIS — R5382 Chronic fatigue, unspecified: Secondary | ICD-10-CM

## 2017-06-06 DIAGNOSIS — C3411 Malignant neoplasm of upper lobe, right bronchus or lung: Secondary | ICD-10-CM | POA: Diagnosis present

## 2017-06-06 DIAGNOSIS — Z5112 Encounter for antineoplastic immunotherapy: Secondary | ICD-10-CM

## 2017-06-06 NOTE — Progress Notes (Signed)
Pt stated she is going to see her PCP next week to get  her medications straightened out . She told me she in taking her inhalers, cymbalta , doxycycline and metoprolol.

## 2017-06-06 NOTE — Progress Notes (Signed)
DISCONTINUE ON PATHWAY REGIMEN - Non-Small Cell Lung     Administer weekly:     Paclitaxel      Carboplatin   **Always confirm dose/schedule in your pharmacy ordering system**    REASON: Continuation Of Treatment PRIOR TREATMENT: LKG401: Carboplatin AUC=2 + Paclitaxel 45 mg/m2 Weekly During Radiation TREATMENT RESPONSE: Partial Response (PR)  START ON PATHWAY REGIMEN - Non-Small Cell Lung     A cycle is every 14 days:     Durvalumab   **Always confirm dose/schedule in your pharmacy ordering system**    Patient Characteristics: Stage III - Unresectable, PS = 0, 1 AJCC T Category: T3 Current Disease Status: No Distant Mets or Local Recurrence AJCC N Category: N2 AJCC M Category: M0 AJCC 8 Stage Grouping: IIIB Performance Status: PS = 0, 1 Intent of Therapy: Curative Intent, Discussed with Patient

## 2017-06-06 NOTE — Telephone Encounter (Signed)
Printed avs and calender for upcoming appointments. Per 10/15 los

## 2017-06-06 NOTE — Progress Notes (Signed)
[place] [phone]) [phone]   [phone]  OFFICE PROGRESS NOTE  [name], MD [place] [address]  DIAGNOSIS: Stage IIIA (T2b, N2, M0) non-small cell lung cancer, squamous cell carcinoma presented with large right upper lobe lung mass in addition to right hilar and mediastinal lymphadenopathy diagnosed in July 2018. PDL 1 expression is negative.  PRIOR THERAPY: concurrent chemoradiation with weekly carboplatin for AUC of 2 and paclitaxel 45 MG/M2 status post 6 cycles, last dose was given 05/02/2017.  CURRENT THERAPY: consolidation immunotherapy with Imfinzi (Durvalumab) 10 MG/KG every 2 weeks, first dose 06/20/2017.  INTERVAL HISTORY: Sydney Blair ALL 61 y.o. female returns to the clinic today for follow-up visit accompanied by a family member.The patient is feeling fine today with no specific complaints except for the baseline shortness of breath and she is currently on home oxygen. She was recently treated at  [facility] for questionable pneumonia and she is currently on doxycycline. She denied having any current chest pain but has mild cough with no hemoptysis. She denied having any weight loss or night sweats. She has no nausea, vomiting, diarrhea or constipation. She has no headache or visual changes. She completed a course of concurrent chemoradiation with weekly carboplatin and paclitaxel and tolerated this treatment well except for mild odynophagia. The patient had repeat CT scan of the chest performed recently and she is here for evaluation and discussion of her scan results.  MEDICAL HISTORY: Past Medical History:  Diagnosis Date  . Anxiety   . ARF (acute renal failure) ([place]) 08/07/2011   ?   Marland Kitchen Arthritis   . 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 Chronic back pain   . COPD (chronic obstructive pulmonary disease) ([place])   . Depression   . Depression with anxiety   . Dyspnea    on exertion  . Fibromyalgia     . GERD (gastroesophageal reflux disease)   . Hypercholesteremia   . Hypothyroidism   . Incontinence   . Neuropathy   . Peripheral neuropathy   . Sciatic pain   . Sebaceous cyst    lt labial location  . Sleep apnea    uses CPAP  . Stage III squamous cell carcinoma of right lung ([place]) 03/10/2017  . Tobacco abuse     ALLERGIES:  is allergic to penicillins.  MEDICATIONS:  Current Outpatient Prescriptions  Medication Sig Dispense Refill  . albuterol (PROVENTIL HFA;VENTOLIN HFA) 108 (90 BASE) MCG/ACT inhaler Inhale 2 puffs into the lungs every 6 (six) hours as needed for wheezing or shortness of breath.    Marland Kitchen aspirin 81 MG chewable tablet Chew 81 mg by mouth daily.    . budesonide-formoterol (SYMBICORT) 160-4.5 MCG/ACT inhaler Inhale 2 puffs into the lungs daily.     Marland Kitchen CALCIUM PO Take 1 tablet by mouth daily.    Marland Kitchen dexlansoprazole (DEXILANT) 60 MG capsule Take 1 capsule (60 mg total) by mouth daily before breakfast. 30 capsule 11  . diclofenac sodium (VOLTAREN) 1 % GEL Apply 4 g topically 4 (four) times daily.    Marland Kitchen doxycycline (VIBRA-TABS) 100 MG tablet Take 1 tablet (100 mg total) by mouth every 12 (twelve) hours. 8 tablet 0  . doxycycline (VIBRA-TABS) 100 MG tablet Take 1 tablet (100 mg total) by mouth 2 (two) times daily. 14 tablet 0  . DULoxetine (CYMBALTA) 60 MG capsule Take 60 mg by mouth daily.    . ferrous sulfate 325 (65 FE) MG tablet Take  1 tablet (325 mg total) by mouth 2 (two) times daily with a meal. 30 tablet 0  . fluticasone (FLONASE) 50 MCG/ACT nasal spray Place 2 sprays into both nostrils daily.    Marland Kitchen gabapentin (NEURONTIN) 400 MG capsule Take 1 capsule (400 mg total) by mouth 3 (three) times daily. 60 capsule 0  . levalbuterol (XOPENEX) 0.63 MG/3ML nebulizer solution Take 3 mLs (0.63 mg total) by nebulization 3 (three) times daily. 3 mL 12  . levothyroxine (SYNTHROID, LEVOTHROID) 100 MCG tablet Take 100 mcg by mouth daily before breakfast. Reported on 02/03/2016    .  magnesium oxide (MAG-OX) 400 (241.3 Mg) MG tablet Take 1 tablet (400 mg total) by mouth 2 (two) times daily. 20 tablet 0  . metoprolol tartrate (LOPRESSOR) 50 MG tablet Take 1 tablet (50 mg total) by mouth 3 (three) times daily. 90 tablet 0  . olopatadine (PATANOL) 0.1 % ophthalmic solution Place 1 drop into both eyes 2 (two) times daily.     . Tapentadol HCl (NUCYNTA) 100 MG TABS Take 1 tablet (100 mg total) by mouth every morning. 10 each 0  . tiotropium (SPIRIVA) 18 MCG inhalation capsule Place 1 capsule (18 mcg total) into inhaler and inhale daily. 30 capsule 6  . TRINTELLIX 10 MG TABS Take 10 mg by mouth daily.  5  . Wound Dressings (SONAFINE) Apply 1 application topically 3 (three) times daily.     No current facility-administered medications for this visit.     SURGICAL HISTORY:  Past Surgical History:  Procedure Laterality Date  . bladder tack    . BREAST BIOPSY Right 12/29/2016   Procedure: RIGHT BREAST BIOPSY;  Surgeon: Aviva Signs, MD;  Location: [facility];  Service: General;  Laterality: Right;  . BREAST LUMPECTOMY     right with node dissection  . COLONOSCOPY  11/11/2010   [id number] rectal polyp and splenic flexure otherwise normal; pathology showed  tubular adenomas. Next TCS 10/2015.  Marland Kitchen ESOPHAGOGASTRODUODENOSCOPY  11/11/2010   BOF:BPZWC hiatal hernia, 102 French dilator  . ESOPHAGOGASTRODUODENOSCOPY (EGD) WITH ESOPHAGEAL DILATION N/A 06/21/2013   HEN:IDPOEU esophagus  - status post Maloney dilation s/p bx (benign esophageal bx)  . VIDEO BRONCHOSCOPY WITH ENDOBRONCHIAL ULTRASOUND N/A 02/28/2017   Procedure: VIDEO BRONCHOSCOPY WITH ENDOBRONCHIAL ULTRASOUND;  Surgeon: Juanito Doom, MD;  Location: [facility];  Service: Thoracic;  Laterality: N/A;    REVIEW OF SYSTEMS:  Constitutional: positive for fatigue Eyes: negative Ears, nose, mouth, throat, and face: negative Respiratory: positive for cough and dyspnea on exertion Cardiovascular: negative Gastrointestinal:  negative Genitourinary:negative Integument/breast: negative Hematologic/lymphatic: negative Musculoskeletal:negative Neurological: negative Behavioral/Psych: negative Endocrine: negative Allergic/Immunologic: negative   PHYSICAL EXAMINATION: General appearance: alert, cooperative, fatigued and no distress Head: Normocephalic, without obvious abnormality, atraumatic Neck: no adenopathy, no JVD, supple, symmetrical, trachea midline and thyroid not enlarged, symmetric, no tenderness/mass/nodules Lymph nodes: Cervical, supraclavicular, and axillary nodes normal. Resp: clear to auscultation bilaterally Back: symmetric, no curvature. ROM normal. No CVA tenderness. Cardio: regular rate and rhythm, S1, S2 normal, no murmur, click, rub or gallop GI: soft, non-tender; bowel sounds normal; no masses,  no organomegaly Extremities: extremities normal, atraumatic, no cyanosis or edema Neurologic: Alert and oriented X 3, normal strength and tone. Normal symmetric reflexes. Normal coordination and gait  ECOG PERFORMANCE STATUS: 1 - Symptomatic but completely ambulatory  Blood pressure (!) 143/51, pulse 93, temperature 98.6 F (37 C), temperature source Oral, resp. rate 18, height 5' (1.524 m), weight 155 lb (70.3 kg), SpO2 97 %.  LABORATORY DATA: Lab  Results  Component Value Date   WBC 5.1 06/03/2017   HGB 8.9 (L) 06/03/2017   HCT 27.8 (L) 06/03/2017   MCV 99.3 06/03/2017   PLT 114 (L) 06/03/2017      Chemistry      Component Value Date/Time   NA 142 06/03/2017 0804   K 3.1 (L) 06/03/2017 0804   CL 101 05/21/2017 0545   CO2 27 06/03/2017 0804   BUN 9.2 06/03/2017 0804   CREATININE 0.8 06/03/2017 0804      Component Value Date/Time   CALCIUM 8.8 06/03/2017 0804   ALKPHOS 76 06/03/2017 0804   AST 13 06/03/2017 0804   ALT 9 06/03/2017 0804   BILITOT 0.82 06/03/2017 0804       RADIOGRAPHIC STUDIES: Dg Chest 2 View  Result Date: 05/26/2017 CLINICAL DATA:  Routine follow-up.  No  complaints. EXAM: CHEST  2 VIEW COMPARISON:  05/18/2017. FINDINGS: Patchy airspace consolidation RIGHT upper lobe slightly improved. Still persistent opacity, recommended follow-up in 4-6 weeks. Air cysts in the RIGHT lung apex are superimposed. The LEFT lung is clear. There is no effusion or pneumothorax. Unchanged and unremarkable cardiomediastinal silhouette. IMPRESSION: Slight improvement in airspace consolidation RIGHT upper lobe, but not yet clear. Continued surveillance warranted. Consider follow-up in 4-6 weeks. Electronically Signed   By: Staci Righter M.D.   On: 05/26/2017 14:30   Ct Head Wo Contrast  Result Date: 05/16/2017 CLINICAL DATA:  Altered mental status EXAM: CT HEAD WITHOUT CONTRAST TECHNIQUE: Contiguous axial images were obtained from the base of the skull through the vertex without intravenous contrast. COMPARISON:  Brain MRI 03/18/2017 FINDINGS: Brain: No mass lesion, intraparenchymal hemorrhage or extra-axial collection. No evidence of acute cortical infarct. Hypoattenuation in the right frontal white matter likely corresponds to prior infarct. There is periventricular hypoattenuation compatible with chronic microvascular disease. Vascular: No hyperdense vessel or unexpected calcification. Skull: Normal visualized skull base, calvarium and extracranial soft tissues. 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 Sinuses/Orbits: No sinus fluid levels or advanced mucosal thickening. No mastoid effusion. Normal orbits. IMPRESSION: 1. No acute intracranial abnormality. 2. 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 Electronically Signed   By: Ulyses Jarred M.D.   On: 05/16/2017 22:57   Ct Chest W Contrast  Result Date: 06/02/2017 CLINICAL DATA:  Stage III squamous cell carcinoma of the right lung. Status post chemo radiation EXAM: CT CHEST WITH CONTRAST TECHNIQUE: Multidetector CT imaging of the chest was performed during intravenous contrast administration. CONTRAST:  31mL  ISOVUE-300 IOPAMIDOL (ISOVUE-300) INJECTION 61% COMPARISON:  PET-CT 02/11/2017 FINDINGS: Cardiovascular: The heart size is normal. No pericardial effusion. Coronary artery calcification is evident. Atherosclerotic calcification is noted in the wall of the thoracic aorta. Mediastinum/Nodes: 13 mm right paratracheal lymph node measured previously is now 7 mm short axis. Other scattered small mediastinal lymph nodes are evident. 10 mm short axis right hilar lymph node is identified. There is no left hilar lymphadenopathy. The esophagus has normal imaging features. There is no axillary lymphadenopathy. Lungs/Pleura: Hypermetabolic posterior right upper lobe pulmonary lesion seen on the previous PET-CT shows interval cavitation lesion measures 6.0 x 3.4 cm (image 45 series 5) compared to 5.5 x 4.6 cm previously. 14 mm subpleural nodule identified right lower lobe on image 66 series 5 is new in the interval. 9 mm right middle lobe subpleural nodule is seen anteriorly on image 61 series 5 is new. 1.8 cm sub solid nodule in the left upper lobe demonstrates centrilobular tree-in-bud opacity and may be an area of infectious/inflammatory  bronchiolitis. Right lower lobe bronchiectasis with small airway impaction is similar to prior. No pulmonary edema or pleural effusion. Upper Abdomen: Left hydronephrosis is incompletely visualized. Musculoskeletal: Bone windows reveal no worrisome lytic or sclerotic osseous lesions. IMPRESSION: 1. Interval enlargement and cavitation of the right upper lobe pulmonary mass consistent with squamous cell carcinoma. 2. Interval development of bilateral pulmonary nodules, 1 of which has features suggesting infectious/inflammatory etiology. Close continued attention recommended as metastatic disease not excluded. 3. Right paratracheal lymph node has decreased slightly in the interval. Right hilar lymph node more evident on today's study with intravenous contrast material. 4. Left hydronephrosis,  incompletely visualized. 5. Coronary artery and Aortic Atherosclerois (ICD10-170.0) Electronically Signed   By: Misty Stanley M.D.   On: 06/02/2017 13:07   Ct Abdomen Pelvis W Contrast  Result Date: 05/16/2017 CLINICAL DATA:  Diarrhea. Elevated liver enzymes. Lung cancer patient with ongoing chemotherapy. EXAM: CT ABDOMEN AND PELVIS WITH CONTRAST TECHNIQUE: Multidetector CT imaging of the abdomen and pelvis was performed using the standard protocol following bolus administration of intravenous contrast. CONTRAST:  100 cc Isovue-300 IV COMPARISON:  PET CT 02/11/2017 FINDINGS: Intubated patient, motion artifact despite sedation. Lower chest: Breathing motion artifact. Mild dependent atelectasis and possible trace pleural effusions. Hepatobiliary: No focal hepatic lesion. Mild gallbladder distention without calcified gallstone. No gross biliary dilatation, motion obscures detailed evaluation. Pancreas: No ductal dilatation. No gross peripancreatic inflammation, motion limited exam. Spleen: Normal in size without focal abnormality. Adrenals/Urinary Tract: No adrenal nodule. Left hydronephrosis and dilatation of the renal pelvis, as seen on prior PET. No obstructing stone. The ureter appears decompressed. Slight diminished left renal enhancement and excretion. No right hydronephrosis. Calcification in the mid upper right kidney appears vascular. Urinary bladder is distended without wall thickening. Stomach/Bowel: Enteric tube in place with tip in the duodenum. Stomach physiologically distended with gastric contents. No gastric wall thickening. Bowel evaluation limited by motion artifact. Liquid stool in the ascending colon without colonic wall thickening. Air-filled nondilated descending and sigmoid colon. No evidence of colonic inflammatory change. Appendix appears normal. Vascular/Lymphatic: Aortic atherosclerosis without aneurysm. Limited assessment for adenopathy given motion. No bulky adenopathy is seen.  Reproductive: 6 cm uterine fibroid causing leftward uterine deviation, similar to prior PET. No adnexal mass, ovaries tentatively identified and quiescent. Other: No evidence of free air allowing for motion. Small amount of free fluid in the pelvis, nonspecific. Possible trace perihepatic ascites. Musculoskeletal: Anterolisthesis of L5 on S1 with degenerative disc disease and facet arthropathy. Milder degenerative change throughout the remainder of the lower thoracic and lumbar spine. No evidence of focal bone lesion. Remote right rib fractures. IMPRESSION: 1. No evidence of colonic inflammation to suggest colitis. Minimal liquid stool in the ascending colon which can be seen with diarrheal process. Motion limits detailed assessment despite sedation. 2. Mild gallbladder distention without calcified gallstone. 3. 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 4. Distended urinary bladder without wall thickening. 5. Uterine fibroid. 6.  Aortic Atherosclerosis (ICD10-I70.0). Electronically Signed   By: Jeb Levering M.D.   On: 05/16/2017 23:01   Dg Chest Port 1 View  Result Date: 05/18/2017 CLINICAL DATA:  Hypoxia EXAM: PORTABLE CHEST 1 VIEW COMPARISON:  May 17, 2017 FINDINGS: Endotracheal tube tip is 3.1 cm above the carina. Nasogastric tube tip and side port below the diaphragm. No evident pneumothorax. There is persistent airspace opacity in the periphery of the right upper lobe, likely pneumonia. There is mild bibasilar atelectatic change. Lungs elsewhere are clear. Heart is mildly  enlarged with pulmonary vascularity within normal limits. No adenopathy. There is aortic atherosclerosis. There is an old healed fracture of the right posterior eighth rib. There are surgical clips in the right axillary region. IMPRESSION: Tube positions as described without pneumothorax. Persistent patchy airspace consolidation right upper lobe consistent with pneumonia, stable. Mild bibasilar  atelectasis. Stable cardiac silhouette. There is aortic atherosclerosis. Aortic Atherosclerosis (ICD10-I70.0). Electronically Signed   By: Lowella Grip III M.D.   On: 05/18/2017 07:08   Dg Chest Port 1 View  Result Date: 05/17/2017 CLINICAL DATA:  Endotracheal tube positioning EXAM: PORTABLE CHEST 1 VIEW COMPARISON:  Yesterday FINDINGS: Endotracheal tube tip is 1 cm above the carina. An orogastric tube reaches the stomach. Right upper lobe airspace disease is mildly improved from yesterday. Worsening aeration at the bases with indistinct diaphragm. This is likely atelectasis. Differences in patient positioning could contribute. No effusion or pneumothorax. Normal heart size and mediastinal contours. IMPRESSION: 1. Lower endotracheal tube, tip 1 cm above the carina. 2. Mild improvement in right upper lobe pneumonia. 3. Worsening aeration at the bases favoring atelectasis. Electronically Signed   By: Monte Fantasia M.D.   On: 05/17/2017 07:23   Dg Chest Portable 1 View  Result Date: 05/16/2017 CLINICAL DATA:  Intubation and gastric tube placement. Patient with lung cancer on chemoradiation therapy. EXAM: PORTABLE CHEST 1 VIEW COMPARISON:  CXR from 1507 hours FINDINGS: The heart size and mediastinal contours are within normal limits. Right upper lobe opacities are again visualized with differential considerations that may also include post treatment change, pneumonia and/or atelectasis among some possibilities though not exclusive. Mild fullness of the right hilum as before. The tip of an endotracheal tube is 3.5 cm above the carina in satisfactory position. A gastric tube is seen extending below the left hemidiaphragm in the expected location of the stomach with the tip is excluded on current exam. Heart is borderline enlarged. Minimal aortic atherosclerosis at the arch. IMPRESSION: 1. Satisfactory support line and tube positions to the extent visualized. 2. Patchy airspace opacities in the right upper  lobe persists without change. Electronically Signed   By: Ashley Royalty M.D.   On: 05/16/2017 21:51   Dg Chest Port 1 View  Result Date: 05/16/2017 CLINICAL DATA:  61 year old female with weakness, tachycardia and diarrhea. Patient with lung cancer on chemoradiation therapy. EXAM: PORTABLE CHEST 1 VIEW COMPARISON:  02/11/2017 head CT. 12/08/2016 and prior chest radiographs FINDINGS: The cardiomediastinal silhouette is unchanged with right hilar fullness. Increased opacity within the peripheral right upper lobe noted. The left lung is clear. No pleural effusion or pneumothorax identified. The right upper lobe mass identified on 12/08/2016 is much less conspicuous. IMPRESSION: Increased opacity within the peripheral right upper lobe which may be related to treatment changes/atelectasis, but pneumonia/ pneumonitis or less likely metastatic disease is not excluded. Electronically Signed   By: Margarette Canada M.D.   On: 05/16/2017 15:23   Dg Swallowing Func-speech Pathology  Result Date: 05/19/2017 Objective Swallowing Evaluation: Type of Study: MBS-Modified Barium Swallow Study Patient Details Name: Sydney MAXIM MRN: [id number] Date of Birth: 03-17-1956 Today's Date: 05/19/2017 Time: SLP Start Time (ACUTE ONLY): 1015-SLP Stop Time (ACUTE ONLY): 1052 SLP Time Calculation (min) (ACUTE ONLY): 37 min Past Medical History: Past Medical History: Diagnosis Date . Anxiety  . ARF (acute renal failure) ([place]) 08/07/2011  ?  Marland Kitchen Arthritis  . Cancer ([place]) 2005  breast-right . Chronic back pain  . COPD (chronic obstructive pulmonary disease) ([place])  . Depression  .  Depression with anxiety  . Dyspnea   on exertion . Fibromyalgia  . GERD (gastroesophageal reflux disease)  . Hypercholesteremia  . Hypothyroidism  . Incontinence  . Neuropathy  . Peripheral neuropathy  . Sciatic pain  . Sebaceous cyst   lt labial location . Sleep apnea   uses CPAP . Stage III squamous cell carcinoma of right lung ([place]) 03/10/2017 . Tobacco abuse  Past  Surgical History: Past Surgical History: Procedure Laterality Date . bladder tack   . BREAST BIOPSY Right 12/29/2016  Procedure: RIGHT BREAST BIOPSY;  Surgeon: Aviva Signs, MD;  Location: [facility];  Service: General;  Laterality: Right; . BREAST LUMPECTOMY    right with node dissection . COLONOSCOPY  11/11/2010  [id number] rectal polyp and splenic flexure otherwise normal; pathology showed  tubular adenomas. Next TCS 10/2015. Marland Kitchen ESOPHAGOGASTRODUODENOSCOPY  11/11/2010  PJK:DTOIZ hiatal hernia, 5 French dilator . ESOPHAGOGASTRODUODENOSCOPY (EGD) WITH ESOPHAGEAL DILATION N/A 06/21/2013  TIW:[id number] esophagus  - status post Maloney dilation s/p bx (benign esophageal bx) . VIDEO BRONCHOSCOPY WITH ENDOBRONCHIAL ULTRASOUND N/A 02/28/2017  Procedure: VIDEO BRONCHOSCOPY WITH ENDOBRONCHIAL ULTRASOUND;  Surgeon: Juanito Doom, MD;  Location: [facility];  Service: Thoracic;  Laterality: N/A; HPI: 61 yo female with h/o smoking, COPD, dysphagia dating back to at least 2012, admitted with n/v/diarrhea - suffered respiratory failure and had to be intubated - Pt self extubated this am - She has h/o squamous cell lung carcinoma and CXR showed right upper lobe airspace disease consistent with pna.  Swallow evaluation ordered.  Subjective: pt in bed, sleepy but participative Assessment / Plan / Recommendation CHL IP CLINICAL IMPRESSIONS 05/19/2017 Clinical Impression Mild oropharyngeal dysphagia mostly consistent with MBS from 03/2013 - with decreased oral coordination, transiting - most notably with solids. Premature spillage with liquids apparent which contributes to decreased timing of laryngeal closure resulting in laryngeal penetration of nectar and penetration and trace aspiration x1 of thin.  Pt did not cough with aspiration but cued cough effective to clear secretions.  Chin tuck did not prevent penetration but most penetrates were cleared with same swallow.  Pharyngeal swallow is strong without pharyngeal residual across all  consistencies.  She piecemeals with solids/purees - and currently does not have dentures at the hospital.   Pt's primary source of dysphagia appears to be esophageal - please note prior esopahgram showing age related dysmotility and ? small hiatal hernia.  Barium tablet given with pudding appeared to lodge at mid-esophagus without pt sensation.  Liquid wash did not clear tablet and resulted in retrograde propulsion of liquid.  Large bolus of pudding eventually forced tablet into stomach.  Suspect baseline dysmotility may be exacerbated due to pt's acute illness.  Unfortunately at this time, pt is not sensate to esophageal residuals nor backflow.  Suspect primary esophageal deficits for this pt.  Aspiration of thin was minimal and not present with small single boluses. Recommend strict esophageal strategies for maximal airway protection.    SLP Visit Diagnosis Dysphagia, oropharyngeal phase (R13.12);Dysphagia, unspecified (R13.10) Attention and concentration deficit following -- Frontal lobe and executive function deficit following -- Impact on safety and function Mild aspiration risk   CHL IP TREATMENT RECOMMENDATION 05/19/2017 Treatment Recommendations Therapy as outlined in treatment plan below   Prognosis 05/19/2017 Prognosis for Safe Diet Advancement Fair Barriers to Reach Goals Time post onset Barriers/Prognosis Comment -- CHL IP DIET RECOMMENDATION 05/19/2017 SLP Diet Recommendations Dysphagia 3 (Mech soft) solids;Thin liquid Liquid Administration via Cup;No straw Medication Administration Whole meds with puree Compensations Slow rate;Small sips/bites;Other (  Comment) Postural Changes Seated upright at 90 degrees;Remain semi-upright after after feeds/meals (Comment)   CHL IP OTHER RECOMMENDATIONS 05/19/2017 Recommended Consults -- Oral Care Recommendations Oral care BID Other Recommendations --   CHL IP FOLLOW UP RECOMMENDATIONS 05/18/2017 Follow up Recommendations (No Data)   CHL IP FREQUENCY AND DURATION 05/19/2017  Speech Therapy Frequency (ACUTE ONLY) min 1 x/week Treatment Duration 1 week      CHL IP ORAL PHASE 04/09/2013 Oral Phase -- Oral - Pudding Teaspoon -- Oral - Pudding Cup -- Oral - Honey Teaspoon -- Oral - Honey Cup -- Oral - Nectar Teaspoon -- Oral - Nectar Cup -- Oral - Nectar Straw -- Oral - Thin Teaspoon -- Oral - Thin Cup -- Oral - Thin Straw -- Oral - Puree -- Oral - Mech Soft -- Oral - Regular -- Oral - Multi-Consistency -- Oral - Pill -- Oral Phase - Comment Mild prolonged oral transit with solids  [name], MS [facility] SLP [phone]               US Liver Doppler  Result Date: 05/17/2017 CLINICAL DATA:  Transaminitis EXAM: DUPLEX ULTRASOUND OF LIVER TECHNIQUE: Color and duplex Doppler ultrasound was performed to evaluate the hepatic in-flow and out-flow vessels. COMPARISON:  None. FINDINGS: Portal Vein Velocities Main:  36.9 cm/sec proximal; 48.1 mid; 35.4 distal Right:  49.4 cm/sec Left:  35.7 cm/sec Flow in the portal vein and its branches is hepatopetal. Hepatic Vein Velocities Right:  38.2 cm/sec Middle:  26.6 cm/sec Left:  41.3 cm/sec. Flow in the hepatic vein and its branches is hepatofugal. Hepatic Artery Velocity:  199.9 cm/sec Splenic Vein Velocity:  26.3 cm/sec Varices: None Ascites: None. There is no splenic or portal vein thrombus or occlusion evident. Spleen measures 11.3 x 12.7 x 5.1 cm with a measured splenic volume of 385 cubic cm. IMPRESSION: The portal vein and branches, hepatic vein and branches, hepatic artery, and splenic vein are all patent with flow in the appropriate anatomic direction. No thrombus or occlusion. No varices or ascites evident. Splenic size within normal limits. Electronically Signed   By: Lowella Grip III M.D.   On: 05/17/2017 13:34   US Abdomen Limited Ruq  Result Date: 05/17/2017 CLINICAL DATA:  Elevated liver enzymes EXAM: ULTRASOUND ABDOMEN LIMITED RIGHT UPPER QUADRANT COMPARISON:  None. FINDINGS: Gallbladder: There is sludge in the gallbladder. There  are no echogenic foci in the gallbladder which move and shadow as is expected with gallstones. No gallbladder wall thickening or pericholecystic fluid. No sonographic Murphy sign noted by sonographer. Common bile duct: Diameter: 4 mm. There is no intrahepatic or extrahepatic biliary duct dilatation. Liver: No focal lesion identified. Within normal limits in parenchymal echogenicity. Portal vein is patent on color Doppler imaging with normal direction of blood flow towards the liver. IMPRESSION: Sludge in gallbladder. No gallstones or gallbladder wall thickening evident. Study otherwise unremarkable. Electronically Signed   By: Lowella Grip III M.D.   On: 05/17/2017 12:57    ASSESSMENT AND PLAN: this is a very pleasant 61 years old white female with a stage IIIa non-small cell lung cancer status post a course of concurrent chemoradiation with weekly carboplatin and paclitaxel status post 6 cycles and tolerated her treatment fairly well. The patient had repeat CT scan of the chest performed recently. I personally and independently reviewed the scan images and discuss the results and showed the images to the patient today. Her scan showed apparent increase in the size of the cavitary lesion but this has  significantly improved compared to the PET scan with more central cavitation indicating response to the treatment. I discussed with the patient several options for management of her condition including close observation and monitoring versus consideration of treatment with consolidation immunotherapy with Imfinzi (Durvalumab) 10 MG/KG every 2 weeks. The patient is interested in consolidation treatment. I discussed with her the adverse effect of the immunotherapy including but not limited to a new mediated the skin rash, diarrhea, inflammation of the lung, liver, kidney, thyroid or other endocrine dysfunction including type 1 diabetes mellitus. She is expected to start the first dose of her treatment on  06/20/2017. For the recent pneumonia, the patient will complete the course of treatment with antibiotic was doxycycline. She was advised to call immediately if she has any concerning symptoms in the interval. The patient voices understanding of current disease status and treatment options and is in agreement with the current care plan.  All questions were answered. The patient knows to call the clinic with any problems, questions or concerns. We can certainly see the patient much sooner if necessary.  I spent 15 minutes counseling the patient face to face. The total time spent in the appointment was 25 minutes.  Disclaimer: This note was dictated with voice recognition software. Similar sounding words can inadvertently be transcribed and may not be corrected upon review.

## 2017-06-09 ENCOUNTER — Ambulatory Visit: Payer: Medicaid Other | Admitting: Cardiology

## 2017-06-10 ENCOUNTER — Encounter: Payer: Self-pay | Admitting: Oncology

## 2017-06-13 ENCOUNTER — Ambulatory Visit
Admission: RE | Admit: 2017-06-13 | Discharge: 2017-06-13 | Disposition: A | Discharge status: Home / Self Care | Payer: Medicaid Other | Source: Ambulatory Visit | Attending: Radiation Oncology | Admitting: Radiation Oncology

## 2017-06-13 ENCOUNTER — Encounter: Payer: Self-pay | Admitting: Radiation Oncology

## 2017-06-13 VITALS — BP 153/73 | HR 72 | Temp 98.2°F | Wt 154.0 lb

## 2017-06-13 DIAGNOSIS — C50911 Malignant neoplasm of unspecified site of right female breast: Secondary | ICD-10-CM

## 2017-06-13 DIAGNOSIS — Z510 Encounter for antineoplastic radiation therapy: Secondary | ICD-10-CM | POA: Insufficient documentation

## 2017-06-13 DIAGNOSIS — C3491 Malignant neoplasm of unspecified part of right bronchus or lung: Secondary | ICD-10-CM | POA: Insufficient documentation

## 2017-06-13 NOTE — Progress Notes (Signed)
Radiation Oncology         [phone] ________________________________  Name: Sydney Blair MRN: [id number]  Date: 06/13/2017  DOB: 02-10-1956    Follow-Up Visit Note  CC: Sydney Ebbs, MD  Juanito Doom, MD  No diagnosis found.  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  Narrative:  The patient returns today for routine follow-up. She is accompanied by a family member today. Since our last visit, the patient was hospitalized from 9/24-9/29/2018. She is on continuous oxygen therapy by nasal cannula at 2L. Patient states she has pain all over. She reports fatigue all the time. She states she coughs up some phlegm that is a yellowish color. Denies any difficulty swallowing. Denies feeling of lump in the chest and is not taking Carafate. She reports having some dry skin but denies skin irritation. She states she is not on chemotherapy.  States she is not taking any of her medications. Patient has an appointment with her physician today to discuss medications. She denies chest pain but, does note that it feels heavy sometimes.                               ALLERGIES:  is allergic to penicillins.  Meds: Current Outpatient Prescriptions  Medication Sig Dispense Refill  . albuterol (PROVENTIL HFA;VENTOLIN HFA) 108 (90 BASE) MCG/ACT inhaler Inhale 2 puffs into the lungs every 6 (six) hours as needed for wheezing or shortness of breath.    Marland Kitchen aspirin 81 MG chewable tablet Chew 81 mg by mouth daily.    . budesonide-formoterol (SYMBICORT) 160-4.5 MCG/ACT inhaler Inhale 2 puffs into the lungs daily.     Marland Kitchen CALCIUM PO Take 1 tablet by mouth daily.    Marland Kitchen dexlansoprazole (DEXILANT) 60 MG capsule Take 1 capsule (60 mg total) by mouth daily before breakfast. (Patient not taking: Reported on 06/13/2017) 30 capsule 11  . diclofenac sodium (VOLTAREN) 1 % GEL  Apply 4 g topically 4 (four) times daily.    Marland Kitchen doxycycline (VIBRA-TABS) 100 MG tablet Take 1 tablet (100 mg total) by mouth every 12 (twelve) hours. (Patient not taking: Reported on 06/13/2017) 8 tablet 0  . DULoxetine (CYMBALTA) 60 MG capsule Take 60 mg by mouth daily.    . ferrous sulfate 325 (65 FE) MG tablet Take 1 tablet (325 mg total) by mouth 2 (two) times daily with a meal. (Patient not taking: Reported on 06/13/2017) 30 tablet 0  . fluticasone (FLONASE) 50 MCG/ACT nasal spray Place 2 sprays into both nostrils daily.    Marland Kitchen gabapentin (NEURONTIN) 400 MG capsule Take 1 capsule (400 mg total) by mouth 3 (three) times daily. (Patient not taking: Reported on 06/13/2017) 60 capsule 0  . levalbuterol (XOPENEX) 0.63 MG/3ML nebulizer solution Take 3 mLs (0.63 mg total) by nebulization 3 (three) times daily. (Patient not taking: Reported on 06/13/2017) 3 mL 12  . levothyroxine (SYNTHROID, LEVOTHROID) 100 MCG tablet Take 100 mcg by mouth daily before breakfast. Reported on 02/03/2016    . magnesium oxide (MAG-OX) 400 (241.3 Mg) MG tablet Take 1 tablet (400 mg total) by mouth 2 (two) times daily. (Patient not taking: Reported on 06/13/2017) 20 tablet 0  . metoprolol tartrate (LOPRESSOR) 50 MG tablet Take 1 tablet (50 mg total) by mouth 3 (three) times daily. (Patient not taking:  Reported on 06/13/2017) 90 tablet 0  . olopatadine (PATANOL) 0.1 % ophthalmic solution Place 1 drop into both eyes 2 (two) times daily.     . Tapentadol HCl (NUCYNTA) 100 MG TABS Take 1 tablet (100 mg total) by mouth every morning. (Patient not taking: Reported on 06/13/2017) 10 each 0  . tiotropium (SPIRIVA) 18 MCG inhalation capsule Place 1 capsule (18 mcg total) into inhaler and inhale daily. (Patient not taking: Reported on 06/13/2017) 30 capsule 6  . TRINTELLIX 10 MG TABS Take 10 mg by mouth daily.  5  . Wound Dressings (SONAFINE) Apply 1 application topically 3 (three) times daily.     No current facility-administered  medications for this encounter.     Physical Findings: The patient is in no acute distress. Patient is alert and oriented. On continuous oxygen therapy by nasal cannula at 2L.  weight is 154 lb (69.9 kg). Her oral temperature is 98.2 F (36.8 C). Her blood pressure is 153/73 (abnormal) and her pulse is 72. Her oxygen saturation is 100%. .   Lungs are clear to auscultation bilaterally. Heart has regular rate and rhythm. No palpable cervical, supraclavicular, or axillary adenopathy. Abdomen soft, non-tender, normal bowel sounds.   Lab Findings: Lab Results  Component Value Date   WBC 5.1 06/03/2017   HGB 8.9 (L) 06/03/2017   HCT 27.8 (L) 06/03/2017   MCV 99.3 06/03/2017   PLT 114 (L) 06/03/2017    Radiographic Findings: Dg Chest 2 View  Result Date: 05/26/2017 CLINICAL DATA:  Routine follow-up.  No complaints. EXAM: CHEST  2 VIEW COMPARISON:  05/18/2017. FINDINGS: Patchy airspace consolidation RIGHT upper lobe slightly improved. Still persistent opacity, recommended follow-up in 4-6 weeks. Air cysts in the RIGHT lung apex are superimposed. The LEFT lung is clear. There is no effusion or pneumothorax. Unchanged and unremarkable cardiomediastinal silhouette. IMPRESSION: Slight improvement in airspace consolidation RIGHT upper lobe, but not yet clear. Continued surveillance warranted. Consider follow-up in 4-6 weeks. Electronically Signed   By: Staci Righter M.D.   On: 05/26/2017 14:30   Ct Head Wo Contrast  Result Date: 05/16/2017 CLINICAL DATA:  Altered mental status EXAM: CT HEAD WITHOUT CONTRAST TECHNIQUE: Contiguous axial images were obtained from the base of the skull through the vertex without intravenous contrast. COMPARISON:  Brain MRI 03/18/2017 FINDINGS: Brain: No mass lesion, intraparenchymal hemorrhage or extra-axial collection. No evidence of acute cortical infarct. Hypoattenuation in the right frontal white matter likely corresponds to prior infarct. There is periventricular  hypoattenuation compatible with chronic microvascular disease. Vascular: No hyperdense vessel or unexpected calcification. Skull: Normal visualized skull base, calvarium and extracranial soft tissues. 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 Sinuses/Orbits: No sinus fluid levels or advanced mucosal thickening. No mastoid effusion. Normal orbits. IMPRESSION: 1. No acute intracranial abnormality. 2. 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 Electronically Signed   By: Ulyses Jarred M.D.   On: 05/16/2017 22:57   Ct Chest W Contrast  Result Date: 06/02/2017 CLINICAL DATA:  Stage III squamous cell carcinoma of the right lung. Status post chemo radiation EXAM: CT CHEST WITH CONTRAST TECHNIQUE: Multidetector CT imaging of the chest was performed during intravenous contrast administration. CONTRAST:  81mL ISOVUE-300 IOPAMIDOL (ISOVUE-300) INJECTION 61% COMPARISON:  PET-CT 02/11/2017 FINDINGS: Cardiovascular: The heart size is normal. No pericardial effusion. Coronary artery calcification is evident. Atherosclerotic calcification is noted in the wall of the thoracic aorta. Mediastinum/Nodes: 13 mm right paratracheal lymph node measured previously is now 7 mm short axis. Other scattered small mediastinal lymph  nodes are evident. 10 mm short axis right hilar lymph node is identified. There is no left hilar lymphadenopathy. The esophagus has normal imaging features. There is no axillary lymphadenopathy. Lungs/Pleura: Hypermetabolic posterior right upper lobe pulmonary lesion seen on the previous PET-CT shows interval cavitation lesion measures 6.0 x 3.4 cm (image 45 series 5) compared to 5.5 x 4.6 cm previously. 14 mm subpleural nodule identified right lower lobe on image 66 series 5 is new in the interval. 9 mm right middle lobe subpleural nodule is seen anteriorly on image 61 series 5 is new. 1.8 cm sub solid nodule in the left upper lobe demonstrates centrilobular tree-in-bud opacity and may be an area  of infectious/inflammatory bronchiolitis. Right lower lobe bronchiectasis with small airway impaction is similar to prior. No pulmonary edema or pleural effusion. Upper Abdomen: Left hydronephrosis is incompletely visualized. Musculoskeletal: Bone windows reveal no worrisome lytic or sclerotic osseous lesions. IMPRESSION: 1. Interval enlargement and cavitation of the right upper lobe pulmonary mass consistent with squamous cell carcinoma. 2. Interval development of bilateral pulmonary nodules, 1 of which has features suggesting infectious/inflammatory etiology. Close continued attention recommended as metastatic disease not excluded. 3. Right paratracheal lymph node has decreased slightly in the interval. Right hilar lymph node more evident on today's study with intravenous contrast material. 4. Left hydronephrosis, incompletely visualized. 5. Coronary artery and Aortic Atherosclerois (ICD10-170.0) Electronically Signed   By: Misty Stanley M.D.   On: 06/02/2017 13:07   Ct Abdomen Pelvis W Contrast  Result Date: 05/16/2017 CLINICAL DATA:  Diarrhea. Elevated liver enzymes. Lung cancer patient with ongoing chemotherapy. EXAM: CT ABDOMEN AND PELVIS WITH CONTRAST TECHNIQUE: Multidetector CT imaging of the abdomen and pelvis was performed using the standard protocol following bolus administration of intravenous contrast. CONTRAST:  100 cc Isovue-300 IV COMPARISON:  PET CT 02/11/2017 FINDINGS: Intubated patient, motion artifact despite sedation. Lower chest: Breathing motion artifact. Mild dependent atelectasis and possible trace pleural effusions. Hepatobiliary: No focal hepatic lesion. Mild gallbladder distention without calcified gallstone. No gross biliary dilatation, motion obscures detailed evaluation. Pancreas: No ductal dilatation. No gross peripancreatic inflammation, motion limited exam. Spleen: Normal in size without focal abnormality. Adrenals/Urinary Tract: No adrenal nodule. Left hydronephrosis and  dilatation of the renal pelvis, as seen on prior PET. No obstructing stone. The ureter appears decompressed. Slight diminished left renal enhancement and excretion. No right hydronephrosis. Calcification in the mid upper right kidney appears vascular. Urinary bladder is distended without wall thickening. Stomach/Bowel: Enteric tube in place with tip in the duodenum. Stomach physiologically distended with gastric contents. No gastric wall thickening. Bowel evaluation limited by motion artifact. Liquid stool in the ascending colon without colonic wall thickening. Air-filled nondilated descending and sigmoid colon. No evidence of colonic inflammatory change. Appendix appears normal. Vascular/Lymphatic: Aortic atherosclerosis without aneurysm. Limited assessment for adenopathy given motion. No bulky adenopathy is seen. Reproductive: 6 cm uterine fibroid causing leftward uterine deviation, similar to prior PET. No adnexal mass, ovaries tentatively identified and quiescent. Other: No evidence of free air allowing for motion. Small amount of free fluid in the pelvis, nonspecific. Possible trace perihepatic ascites. Musculoskeletal: Anterolisthesis of L5 on S1 with degenerative disc disease and facet arthropathy. Milder degenerative change throughout the remainder of the lower thoracic and lumbar spine. No evidence of focal bone lesion. Remote right rib fractures. IMPRESSION: 1. No evidence of colonic inflammation to suggest colitis. Minimal liquid stool in the ascending colon which can be seen with diarrheal process. Motion limits detailed assessment despite sedation. 2. Mild gallbladder  distention without calcified gallstone. 3. 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 4. Distended urinary bladder without wall thickening. 5. Uterine fibroid. 6.  Aortic Atherosclerosis (ICD10-I70.0). Electronically Signed   By: Jeb Levering M.D.   On: 05/16/2017 23:01   Dg Chest Port 1 View  Result Date:  05/18/2017 CLINICAL DATA:  Hypoxia EXAM: PORTABLE CHEST 1 VIEW COMPARISON:  May 17, 2017 FINDINGS: Endotracheal tube tip is 3.1 cm above the carina. Nasogastric tube tip and side port below the diaphragm. No evident pneumothorax. There is persistent airspace opacity in the periphery of the right upper lobe, likely pneumonia. There is mild bibasilar atelectatic change. Lungs elsewhere are clear. Heart is mildly enlarged with pulmonary vascularity within normal limits. No adenopathy. There is aortic atherosclerosis. There is an old healed fracture of the right posterior eighth rib. There are surgical clips in the right axillary region. IMPRESSION: Tube positions as described without pneumothorax. Persistent patchy airspace consolidation right upper lobe consistent with pneumonia, stable. Mild bibasilar atelectasis. Stable cardiac silhouette. There is aortic atherosclerosis. Aortic Atherosclerosis (ICD10-I70.0). Electronically Signed   By: Lowella Grip III M.D.   On: 05/18/2017 07:08   Dg Chest Port 1 View  Result Date: 05/17/2017 CLINICAL DATA:  Endotracheal tube positioning EXAM: PORTABLE CHEST 1 VIEW COMPARISON:  Yesterday FINDINGS: Endotracheal tube tip is 1 cm above the carina. An orogastric tube reaches the stomach. Right upper lobe airspace disease is mildly improved from yesterday. Worsening aeration at the bases with indistinct diaphragm. This is likely atelectasis. Differences in patient positioning could contribute. No effusion or pneumothorax. Normal heart size and mediastinal contours. IMPRESSION: 1. Lower endotracheal tube, tip 1 cm above the carina. 2. Mild improvement in right upper lobe pneumonia. 3. Worsening aeration at the bases favoring atelectasis. Electronically Signed   By: Monte Fantasia M.D.   On: 05/17/2017 07:23   Dg Chest Portable 1 View  Result Date: 05/16/2017 CLINICAL DATA:  Intubation and gastric tube placement. Patient with lung cancer on chemoradiation therapy.  EXAM: PORTABLE CHEST 1 VIEW COMPARISON:  CXR from 1507 hours FINDINGS: The heart size and mediastinal contours are within normal limits. Right upper lobe opacities are again visualized with differential considerations that may also include post treatment change, pneumonia and/or atelectasis among some possibilities though not exclusive. Mild fullness of the right hilum as before. The tip of an endotracheal tube is 3.5 cm above the carina in satisfactory position. A gastric tube is seen extending below the left hemidiaphragm in the expected location of the stomach with the tip is excluded on current exam. Heart is borderline enlarged. Minimal aortic atherosclerosis at the arch. IMPRESSION: 1. Satisfactory support line and tube positions to the extent visualized. 2. Patchy airspace opacities in the right upper lobe persists without change. Electronically Signed   By: Ashley Royalty M.D.   On: 05/16/2017 21:51   Dg Chest Port 1 View  Result Date: 05/16/2017 CLINICAL DATA:  61 year old female with weakness, tachycardia and diarrhea. Patient with lung cancer on chemoradiation therapy. EXAM: PORTABLE CHEST 1 VIEW COMPARISON:  02/11/2017 head CT. 12/08/2016 and prior chest radiographs FINDINGS: The cardiomediastinal silhouette is unchanged with right hilar fullness. Increased opacity within the peripheral right upper lobe noted. The left lung is clear. No pleural effusion or pneumothorax identified. The right upper lobe mass identified on 12/08/2016 is much less conspicuous. IMPRESSION: Increased opacity within the peripheral right upper lobe which may be related to treatment changes/atelectasis, but pneumonia/ pneumonitis or less likely metastatic disease  is not excluded. Electronically Signed   By: Margarette Canada M.D.   On: 05/16/2017 15:23   Dg Swallowing Func-speech Pathology  Result Date: 05/19/2017 Objective Swallowing Evaluation: Type of Study: MBS-Modified Barium Swallow Study Patient Details Name: ELIKA GODAR MRN: [id number] Date of Birth: 1956/05/30 Today's Date: 05/19/2017 Time: SLP Start Time (ACUTE ONLY): 1015-SLP Stop Time (ACUTE ONLY): 1052 SLP Time Calculation (min) (ACUTE ONLY): 37 min Past Medical History: Past Medical History: Diagnosis Date . Anxiety  . ARF (acute renal failure) ([place]) 08/07/2011  ?  Marland Kitchen Arthritis  . Cancer ([place]) 2005  breast-right . Chronic back pain  . COPD (chronic obstructive pulmonary disease) ([place])  . Depression  . Depression with anxiety  . Dyspnea   on exertion . Fibromyalgia  . GERD (gastroesophageal reflux disease)  . Hypercholesteremia  . Hypothyroidism  . Incontinence  . Neuropathy  . Peripheral neuropathy  . Sciatic pain  . Sebaceous cyst   lt labial location . Sleep apnea   uses CPAP . Stage III squamous cell carcinoma of right lung ([place]) 03/10/2017 . Tobacco abuse  Past Surgical History: Past Surgical History: Procedure Laterality Date . bladder tack   . BREAST BIOPSY Right 12/29/2016  Procedure: RIGHT BREAST BIOPSY;  Surgeon: Aviva Signs, MD;  Location: [facility];  Service: General;  Laterality: Right; . BREAST LUMPECTOMY    right with node dissection . COLONOSCOPY  11/11/2010  YSA:[id number] rectal polyp and splenic flexure otherwise normal; pathology showed  tubular adenomas. Next TCS 10/2015. Marland Kitchen ESOPHAGOGASTRODUODENOSCOPY  11/11/2010  ATF:TDDUK hiatal hernia, 16 French dilator . ESOPHAGOGASTRODUODENOSCOPY (EGD) WITH ESOPHAGEAL DILATION N/A 06/21/2013  GUR:KYHCWC esophagus  - status post Maloney dilation s/p bx (benign esophageal bx) . VIDEO BRONCHOSCOPY WITH ENDOBRONCHIAL ULTRASOUND N/A 02/28/2017  Procedure: VIDEO BRONCHOSCOPY WITH ENDOBRONCHIAL ULTRASOUND;  Surgeon: Juanito Doom, MD;  Location: [facility];  Service: Thoracic;  Laterality: N/A; HPI: 61 yo female with h/o smoking, COPD, dysphagia dating back to at least 2012, admitted with n/v/diarrhea - suffered respiratory failure and had to be intubated - Pt self extubated this am - She has h/o squamous cell lung carcinoma and  CXR showed right upper lobe airspace disease consistent with pna.  Swallow evaluation ordered.  Subjective: pt in bed, sleepy but participative Assessment / Plan / Recommendation CHL IP CLINICAL IMPRESSIONS 05/19/2017 Clinical Impression Mild oropharyngeal dysphagia mostly consistent with MBS from 03/2013 - with decreased oral coordination, transiting - most notably with solids. Premature spillage with liquids apparent which contributes to decreased timing of laryngeal closure resulting in laryngeal penetration of nectar and penetration and trace aspiration x1 of thin.  Pt did not cough with aspiration but cued cough effective to clear secretions.  Chin tuck did not prevent penetration but most penetrates were cleared with same swallow.  Pharyngeal swallow is strong without pharyngeal residual across all consistencies.  She piecemeals with solids/purees - and currently does not have dentures at the hospital.   Pt's primary source of dysphagia appears to be esophageal - please note prior esopahgram showing age related dysmotility and ? small hiatal hernia.  Barium tablet given with pudding appeared to lodge at mid-esophagus without pt sensation.  Liquid wash did not clear tablet and resulted in retrograde propulsion of liquid.  Large bolus of pudding eventually forced tablet into stomach.  Suspect baseline dysmotility may be exacerbated due to pt's acute illness.  Unfortunately at this time, pt is not sensate to esophageal residuals nor backflow.  Suspect primary esophageal deficits for this pt.  Aspiration of thin was minimal and not present with small single boluses. Recommend strict esophageal strategies for maximal airway protection.    SLP Visit Diagnosis Dysphagia, oropharyngeal phase (R13.12);Dysphagia, unspecified (R13.10) Attention and concentration deficit following -- Frontal lobe and executive function deficit following -- Impact on safety and function Mild aspiration risk   CHL IP TREATMENT RECOMMENDATION  05/19/2017 Treatment Recommendations Therapy as outlined in treatment plan below   Prognosis 05/19/2017 Prognosis for Safe Diet Advancement Fair Barriers to Reach Goals Time post onset Barriers/Prognosis Comment -- CHL IP DIET RECOMMENDATION 05/19/2017 SLP Diet Recommendations Dysphagia 3 (Mech soft) solids;Thin liquid Liquid Administration via Cup;No straw Medication Administration Whole meds with puree Compensations Slow rate;Small sips/bites;Other (Comment) Postural Changes Seated upright at 90 degrees;Remain semi-upright after after feeds/meals (Comment)   CHL IP OTHER RECOMMENDATIONS 05/19/2017 Recommended Consults -- Oral Care Recommendations Oral care BID Other Recommendations --   CHL IP FOLLOW UP RECOMMENDATIONS 05/18/2017 Follow up Recommendations (No Data)   CHL IP FREQUENCY AND DURATION 05/19/2017 Speech Therapy Frequency (ACUTE ONLY) min 1 x/week Treatment Duration 1 week      CHL IP ORAL PHASE 04/09/2013 Oral Phase -- Oral - Pudding Teaspoon -- Oral - Pudding Cup -- Oral - Honey Teaspoon -- Oral - Honey Cup -- Oral - Nectar Teaspoon -- Oral - Nectar Cup -- Oral - Nectar Straw -- Oral - Thin Teaspoon -- Oral - Thin Cup -- Oral - Thin Straw -- Oral - Puree -- Oral - Mech Soft -- Oral - Regular -- Oral - Multi-Consistency -- Oral - Pill -- Oral Phase - Comment Mild prolonged oral transit with solids  [name], MS [facility] SLP [phone]               US Liver Doppler  Result Date: 05/17/2017 CLINICAL DATA:  Transaminitis EXAM: DUPLEX ULTRASOUND OF LIVER TECHNIQUE: Color and duplex Doppler ultrasound was performed to evaluate the hepatic in-flow and out-flow vessels. COMPARISON:  None. FINDINGS: Portal Vein Velocities Main:  36.9 cm/sec proximal; 48.1 mid; 35.4 distal Right:  49.4 cm/sec Left:  35.7 cm/sec Flow in the portal vein and its branches is hepatopetal. Hepatic Vein Velocities Right:  38.2 cm/sec Middle:  26.6 cm/sec Left:  41.3 cm/sec. Flow in the hepatic vein and its branches is hepatofugal. Hepatic  Artery Velocity:  199.9 cm/sec Splenic Vein Velocity:  26.3 cm/sec Varices: None Ascites: None. There is no splenic or portal vein thrombus or occlusion evident. Spleen measures 11.3 x 12.7 x 5.1 cm with a measured splenic volume of 385 cubic cm. IMPRESSION: The portal vein and branches, hepatic vein and branches, hepatic artery, and splenic vein are all patent with flow in the appropriate anatomic direction. No thrombus or occlusion. No varices or ascites evident. Splenic size within normal limits. Electronically Signed   By: Lowella Grip III M.D.   On: 05/17/2017 13:34   US Abdomen Limited Ruq  Result Date: 05/17/2017 CLINICAL DATA:  Elevated liver enzymes EXAM: ULTRASOUND ABDOMEN LIMITED RIGHT UPPER QUADRANT COMPARISON:  None. FINDINGS: Gallbladder: There is sludge in the gallbladder. There are no echogenic foci in the gallbladder which move and shadow as is expected with gallstones. No gallbladder wall thickening or pericholecystic fluid. No sonographic Murphy sign noted by sonographer. Common bile duct: Diameter: 4 mm. There is no intrahepatic or extrahepatic biliary duct dilatation. Liver: No focal lesion identified. Within normal limits in parenchymal echogenicity. Portal vein is patent on color Doppler imaging with normal direction of blood flow towards the liver. IMPRESSION: Sludge  in gallbladder. No gallstones or gallbladder wall thickening evident. Study otherwise unremarkable. Electronically Signed   By: Lowella Grip III M.D.   On: 05/17/2017 12:57    Impression:  The patient is recovering from the effects of radiation.  She has had a partial response to treatment and will proceed with immunotherapy.   Plan:  She will continue to follow closely with medical oncology. She will be seen in radiation oncology on an as needed basis.   -----------------------------------  [name], PhD, MD  This document serves as a record of services personally performed by Gery Pray, MD. It  was created on his behalf by Arlyce Harman, a trained medical scribe. The creation of this record is based on the scribe's personal observations and the provider's statements to them. This document has been checked and approved by the attending provider.

## 2017-06-13 NOTE — Progress Notes (Signed)
Ms. Sydney Blair is here for a 1 month follow for lung. Pt states she has pain all over. Pt states that she has fatigue all the time. Pt states that she coughs of some phlegm that is a yellowish color. Denies any difficulty swallowing. Denies feelings of lump in the chest and is not taking Carafate. Pt states that she has some dry skin but denies skin irritation. Pt states she is not on chemotherapy. Pt. States there was a situation with ICU MD and medications. Pt states she is not taking any of her medications. Medication list was entered as "Not Taking" until she gets her meds corrected with her physician. Pt states she has an appointment today.  BP (!) 153/73   Pulse 72   Temp 98.2 F (36.8 C) (Oral)   Wt 154 lb (69.9 kg)   SpO2 100% Comment: on 2L oxygen  BMI 30.08 kg/m  Wt Readings from Last 3 Encounters:  06/13/17 154 lb (69.9 kg)  06/06/17 155 lb (70.3 kg)  05/26/17 148 lb (67.1 kg)

## 2017-06-18 ENCOUNTER — Inpatient Hospital Stay (HOSPITAL_COMMUNITY)
Admission: EM | Admit: 2017-06-18 | Discharge: 2017-06-21 | DRG: 194 | Disposition: A | Discharge status: Home / Self Care | Payer: Medicaid Other | Attending: Family Medicine | Admitting: Family Medicine

## 2017-06-18 ENCOUNTER — Encounter (HOSPITAL_COMMUNITY): Payer: Self-pay | Admitting: *Deleted

## 2017-06-18 ENCOUNTER — Emergency Department (HOSPITAL_COMMUNITY): Payer: Medicaid Other

## 2017-06-18 DIAGNOSIS — K219 Gastro-esophageal reflux disease without esophagitis: Secondary | ICD-10-CM | POA: Diagnosis present

## 2017-06-18 DIAGNOSIS — E7800 Pure hypercholesterolemia, unspecified: Secondary | ICD-10-CM | POA: Diagnosis present

## 2017-06-18 DIAGNOSIS — Z7951 Long term (current) use of inhaled steroids: Secondary | ICD-10-CM

## 2017-06-18 DIAGNOSIS — Z825 Family history of asthma and other chronic lower respiratory diseases: Secondary | ICD-10-CM

## 2017-06-18 DIAGNOSIS — Z792 Long term (current) use of antibiotics: Secondary | ICD-10-CM

## 2017-06-18 DIAGNOSIS — C3411 Malignant neoplasm of upper lobe, right bronchus or lung: Secondary | ICD-10-CM | POA: Diagnosis present

## 2017-06-18 DIAGNOSIS — J449 Chronic obstructive pulmonary disease, unspecified: Secondary | ICD-10-CM | POA: Diagnosis present

## 2017-06-18 DIAGNOSIS — D649 Anemia, unspecified: Secondary | ICD-10-CM | POA: Diagnosis present

## 2017-06-18 DIAGNOSIS — M549 Dorsalgia, unspecified: Secondary | ICD-10-CM | POA: Diagnosis present

## 2017-06-18 DIAGNOSIS — F17210 Nicotine dependence, cigarettes, uncomplicated: Secondary | ICD-10-CM | POA: Diagnosis present

## 2017-06-18 DIAGNOSIS — N1330 Unspecified hydronephrosis: Secondary | ICD-10-CM | POA: Diagnosis present

## 2017-06-18 DIAGNOSIS — Z853 Personal history of malignant neoplasm of breast: Secondary | ICD-10-CM

## 2017-06-18 DIAGNOSIS — Z880 Allergy status to penicillin: Secondary | ICD-10-CM

## 2017-06-18 DIAGNOSIS — F418 Other specified anxiety disorders: Secondary | ICD-10-CM | POA: Diagnosis present

## 2017-06-18 DIAGNOSIS — Z8249 Family history of ischemic heart disease and other diseases of the circulatory system: Secondary | ICD-10-CM

## 2017-06-18 DIAGNOSIS — Z7982 Long term (current) use of aspirin: Secondary | ICD-10-CM

## 2017-06-18 DIAGNOSIS — E039 Hypothyroidism, unspecified: Secondary | ICD-10-CM | POA: Diagnosis present

## 2017-06-18 DIAGNOSIS — M797 Fibromyalgia: Secondary | ICD-10-CM | POA: Diagnosis present

## 2017-06-18 DIAGNOSIS — G4730 Sleep apnea, unspecified: Secondary | ICD-10-CM | POA: Diagnosis present

## 2017-06-18 DIAGNOSIS — J440 Chronic obstructive pulmonary disease with acute lower respiratory infection: Secondary | ICD-10-CM | POA: Diagnosis present

## 2017-06-18 DIAGNOSIS — Z923 Personal history of irradiation: Secondary | ICD-10-CM

## 2017-06-18 DIAGNOSIS — F17200 Nicotine dependence, unspecified, uncomplicated: Secondary | ICD-10-CM | POA: Diagnosis present

## 2017-06-18 DIAGNOSIS — Z9221 Personal history of antineoplastic chemotherapy: Secondary | ICD-10-CM

## 2017-06-18 DIAGNOSIS — Z85118 Personal history of other malignant neoplasm of bronchus and lung: Secondary | ICD-10-CM

## 2017-06-18 DIAGNOSIS — J189 Pneumonia, unspecified organism: Secondary | ICD-10-CM | POA: Diagnosis present

## 2017-06-18 DIAGNOSIS — G8929 Other chronic pain: Secondary | ICD-10-CM | POA: Diagnosis present

## 2017-06-18 DIAGNOSIS — E876 Hypokalemia: Secondary | ICD-10-CM | POA: Diagnosis present

## 2017-06-18 DIAGNOSIS — C3491 Malignant neoplasm of unspecified part of right bronchus or lung: Secondary | ICD-10-CM | POA: Diagnosis present

## 2017-06-18 DIAGNOSIS — Z79890 Hormone replacement therapy (postmenopausal): Secondary | ICD-10-CM

## 2017-06-18 DIAGNOSIS — G629 Polyneuropathy, unspecified: Secondary | ICD-10-CM | POA: Diagnosis present

## 2017-06-18 DIAGNOSIS — Y95 Nosocomial condition: Secondary | ICD-10-CM | POA: Diagnosis present

## 2017-06-18 DIAGNOSIS — E785 Hyperlipidemia, unspecified: Secondary | ICD-10-CM | POA: Diagnosis present

## 2017-06-18 DIAGNOSIS — R042 Hemoptysis: Secondary | ICD-10-CM | POA: Diagnosis present

## 2017-06-18 DIAGNOSIS — Z79899 Other long term (current) drug therapy: Secondary | ICD-10-CM

## 2017-06-18 NOTE — ED Triage Notes (Signed)
Pt arrived by EMS from home. Pt has lung cancer & finished treatment of 20th of August. States was coughing up some bright red blood.

## 2017-06-19 ENCOUNTER — Emergency Department (HOSPITAL_COMMUNITY): Payer: Medicaid Other

## 2017-06-19 ENCOUNTER — Encounter (HOSPITAL_COMMUNITY): Payer: Self-pay

## 2017-06-19 DIAGNOSIS — G4730 Sleep apnea, unspecified: Secondary | ICD-10-CM | POA: Diagnosis present

## 2017-06-19 DIAGNOSIS — J440 Chronic obstructive pulmonary disease with acute lower respiratory infection: Secondary | ICD-10-CM | POA: Diagnosis present

## 2017-06-19 DIAGNOSIS — M797 Fibromyalgia: Secondary | ICD-10-CM | POA: Diagnosis present

## 2017-06-19 DIAGNOSIS — Y95 Nosocomial condition: Secondary | ICD-10-CM | POA: Diagnosis present

## 2017-06-19 DIAGNOSIS — Z8249 Family history of ischemic heart disease and other diseases of the circulatory system: Secondary | ICD-10-CM | POA: Diagnosis not present

## 2017-06-19 DIAGNOSIS — Z853 Personal history of malignant neoplasm of breast: Secondary | ICD-10-CM | POA: Diagnosis not present

## 2017-06-19 DIAGNOSIS — Z825 Family history of asthma and other chronic lower respiratory diseases: Secondary | ICD-10-CM | POA: Diagnosis not present

## 2017-06-19 DIAGNOSIS — Z880 Allergy status to penicillin: Secondary | ICD-10-CM | POA: Diagnosis not present

## 2017-06-19 DIAGNOSIS — C3411 Malignant neoplasm of upper lobe, right bronchus or lung: Secondary | ICD-10-CM | POA: Diagnosis present

## 2017-06-19 DIAGNOSIS — Z923 Personal history of irradiation: Secondary | ICD-10-CM | POA: Diagnosis not present

## 2017-06-19 DIAGNOSIS — E7800 Pure hypercholesterolemia, unspecified: Secondary | ICD-10-CM | POA: Diagnosis present

## 2017-06-19 DIAGNOSIS — F17210 Nicotine dependence, cigarettes, uncomplicated: Secondary | ICD-10-CM | POA: Diagnosis present

## 2017-06-19 DIAGNOSIS — E039 Hypothyroidism, unspecified: Secondary | ICD-10-CM

## 2017-06-19 DIAGNOSIS — Z792 Long term (current) use of antibiotics: Secondary | ICD-10-CM | POA: Diagnosis not present

## 2017-06-19 DIAGNOSIS — Z7951 Long term (current) use of inhaled steroids: Secondary | ICD-10-CM | POA: Diagnosis not present

## 2017-06-19 DIAGNOSIS — Z79890 Hormone replacement therapy (postmenopausal): Secondary | ICD-10-CM | POA: Diagnosis not present

## 2017-06-19 DIAGNOSIS — E876 Hypokalemia: Secondary | ICD-10-CM | POA: Diagnosis present

## 2017-06-19 DIAGNOSIS — K219 Gastro-esophageal reflux disease without esophagitis: Secondary | ICD-10-CM | POA: Diagnosis present

## 2017-06-19 DIAGNOSIS — Z79899 Other long term (current) drug therapy: Secondary | ICD-10-CM | POA: Diagnosis not present

## 2017-06-19 DIAGNOSIS — J189 Pneumonia, unspecified organism: Secondary | ICD-10-CM | POA: Diagnosis present

## 2017-06-19 DIAGNOSIS — R042 Hemoptysis: Secondary | ICD-10-CM | POA: Diagnosis present

## 2017-06-19 DIAGNOSIS — N1330 Unspecified hydronephrosis: Secondary | ICD-10-CM | POA: Diagnosis present

## 2017-06-19 DIAGNOSIS — J449 Chronic obstructive pulmonary disease, unspecified: Secondary | ICD-10-CM | POA: Diagnosis not present

## 2017-06-19 DIAGNOSIS — Z7982 Long term (current) use of aspirin: Secondary | ICD-10-CM | POA: Diagnosis not present

## 2017-06-19 DIAGNOSIS — G629 Polyneuropathy, unspecified: Secondary | ICD-10-CM | POA: Diagnosis present

## 2017-06-19 HISTORY — DX: Hypothyroidism, unspecified: E03.9

## 2017-06-19 LAB — COMPREHENSIVE METABOLIC PANEL
ALBUMIN: 3.6 g/dL (ref 3.5–5.0)
ALT: 7 U/L — AB (ref 14–54)
AST: 15 U/L (ref 15–41)
Alkaline Phosphatase: 69 U/L (ref 38–126)
Anion gap: 12 (ref 5–15)
BUN: 12 mg/dL (ref 6–20)
CHLORIDE: 101 mmol/L (ref 101–111)
CO2: 29 mmol/L (ref 22–32)
CREATININE: 0.67 mg/dL (ref 0.44–1.00)
Calcium: 9.1 mg/dL (ref 8.9–10.3)
GFR calc Af Amer: >60 mL/min (ref >60)
GFR calc non Af Amer: >60 mL/min (ref >60)
GLUCOSE: 125 mg/dL — AB (ref 65–99)
Potassium: 3 mmol/L — ABNORMAL LOW (ref 3.5–5.1)
SODIUM: 142 mmol/L (ref 135–145)
Total Bilirubin: 0.8 mg/dL (ref 0.3–1.2)
Total Protein: 7.2 g/dL (ref 6.5–8.1)

## 2017-06-19 LAB — CBC WITH DIFFERENTIAL/PLATELET
Basophils Absolute: 0 10*3/uL (ref 0.0–0.1)
Basophils Relative: 0 %
EOS ABS: 0 10*3/uL (ref 0.0–0.7)
EOS PCT: 0 %
HCT: 28.7 % — ABNORMAL LOW (ref 36.0–46.0)
HEMOGLOBIN: 9.7 g/dL — AB (ref 12.0–15.0)
LYMPHS ABS: 0.7 10*3/uL (ref 0.7–4.0)
Lymphocytes Relative: 9 %
MCH: 34 pg (ref 26.0–34.0)
MCHC: 33.8 g/dL (ref 30.0–36.0)
MCV: 100.7 fL — ABNORMAL HIGH (ref 78.0–100.0)
MONOS PCT: 7 %
Monocytes Absolute: 0.5 10*3/uL (ref 0.1–1.0)
NEUTROS PCT: 84 %
Neutro Abs: 6.5 10*3/uL (ref 1.7–7.7)
Platelets: 184 10*3/uL (ref 150–400)
RBC: 2.85 MIL/uL — ABNORMAL LOW (ref 3.87–5.11)
RDW: 16.2 % — ABNORMAL HIGH (ref 11.5–15.5)
WBC: 7.8 10*3/uL (ref 4.0–10.5)

## 2017-06-19 LAB — BASIC METABOLIC PANEL
ANION GAP: 10 (ref 5–15)
BUN: 9 mg/dL (ref 6–20)
CHLORIDE: 102 mmol/L (ref 101–111)
CO2: 30 mmol/L (ref 22–32)
Calcium: 8.9 mg/dL (ref 8.9–10.3)
Creatinine, Ser: 0.59 mg/dL (ref 0.44–1.00)
GFR calc Af Amer: >60 mL/min (ref >60)
Glucose, Bld: 108 mg/dL — ABNORMAL HIGH (ref 65–99)
POTASSIUM: 2.8 mmol/L — AB (ref 3.5–5.1)
SODIUM: 142 mmol/L (ref 135–145)

## 2017-06-19 LAB — MAGNESIUM
MAGNESIUM: 1.5 mg/dL — AB (ref 1.7–2.4)
MAGNESIUM: 1.6 mg/dL — AB (ref 1.7–2.4)

## 2017-06-19 MED ORDER — SODIUM CHLORIDE 0.9 % IV BOLUS (SEPSIS)
500.0000 mL | Freq: Once | INTRAVENOUS | Status: AC
  Administered 2017-06-19: 500 mL via INTRAVENOUS

## 2017-06-19 MED ORDER — VANCOMYCIN HCL IN DEXTROSE 1-5 GM/200ML-% IV SOLN
1000.0000 mg | Freq: Once | INTRAVENOUS | Status: AC
  Administered 2017-06-19: 1000 mg via INTRAVENOUS
  Filled 2017-06-19: qty 200

## 2017-06-19 MED ORDER — LEVOTHYROXINE SODIUM 100 MCG PO TABS
100.0000 ug | ORAL_TABLET | Freq: Every day | ORAL | Status: DC
  Administered 2017-06-19 – 2017-06-21 (×3): 100 ug via ORAL
  Filled 2017-06-19 (×3): qty 1

## 2017-06-19 MED ORDER — METOPROLOL TARTRATE 50 MG PO TABS
50.0000 mg | ORAL_TABLET | Freq: Three times a day (TID) | ORAL | Status: DC
  Administered 2017-06-19 – 2017-06-21 (×7): 50 mg via ORAL
  Filled 2017-06-19 (×7): qty 1

## 2017-06-19 MED ORDER — NICOTINE 14 MG/24HR TD PT24
14.0000 mg | MEDICATED_PATCH | Freq: Every day | TRANSDERMAL | Status: DC
  Administered 2017-06-19 – 2017-06-21 (×3): 14 mg via TRANSDERMAL
  Filled 2017-06-19 (×3): qty 1

## 2017-06-19 MED ORDER — MAGNESIUM OXIDE 400 (241.3 MG) MG PO TABS
400.0000 mg | ORAL_TABLET | Freq: Two times a day (BID) | ORAL | Status: DC
  Administered 2017-06-19 – 2017-06-21 (×5): 400 mg via ORAL
  Filled 2017-06-19 (×5): qty 1

## 2017-06-19 MED ORDER — ONDANSETRON HCL 4 MG PO TABS: 4.0000 mg | ORAL_TABLET | Freq: Four times a day (QID) | ORAL | Status: DC | PRN

## 2017-06-19 MED ORDER — POTASSIUM CHLORIDE IN NACL 40-0.9 MEQ/L-% IV SOLN
INTRAVENOUS | Status: DC
Start: 2017-06-19 — End: 2017-06-20
  Administered 2017-06-19 (×2): 75 mL/h via INTRAVENOUS

## 2017-06-19 MED ORDER — ASPIRIN 81 MG PO CHEW
81.0000 mg | CHEWABLE_TABLET | Freq: Every day | ORAL | Status: DC
  Administered 2017-06-19 – 2017-06-21 (×3): 81 mg via ORAL
  Filled 2017-06-19 (×3): qty 1

## 2017-06-19 MED ORDER — IPRATROPIUM-ALBUTEROL 0.5-2.5 (3) MG/3ML IN SOLN: 3.0000 mL | RESPIRATORY_TRACT | Status: DC | PRN

## 2017-06-19 MED ORDER — DEXTROSE 5 % IV SOLN
1.0000 g | Freq: Three times a day (TID) | INTRAVENOUS | Status: DC
Start: 2017-06-19 — End: 2017-06-19
  Administered 2017-06-19 (×2): 1 g via INTRAVENOUS
  Filled 2017-06-19 (×6): qty 1

## 2017-06-19 MED ORDER — IOPAMIDOL (ISOVUE-370) INJECTION 76%
100.0000 mL | Freq: Once | INTRAVENOUS | Status: AC | PRN
  Administered 2017-06-19: 100 mL via INTRAVENOUS

## 2017-06-19 MED ORDER — POTASSIUM CHLORIDE IN NACL 20-0.45 MEQ/L-% IV SOLN
INTRAVENOUS | Status: DC
  Administered 2017-06-19: 04:00:00 via INTRAVENOUS
  Filled 2017-06-19 (×2): qty 1000

## 2017-06-19 MED ORDER — DEXTROSE 5 % IV SOLN
1.0000 g | Freq: Three times a day (TID) | INTRAVENOUS | Status: DC
  Administered 2017-06-19 – 2017-06-21 (×5): 1 g via INTRAVENOUS
  Filled 2017-06-19 (×9): qty 1

## 2017-06-19 MED ORDER — POTASSIUM CHLORIDE CRYS ER 20 MEQ PO TBCR
50.0000 meq | EXTENDED_RELEASE_TABLET | Freq: Once | ORAL | Status: AC
  Administered 2017-06-19: 09:00:00 50 meq via ORAL
  Filled 2017-06-19: qty 1

## 2017-06-19 MED ORDER — ONDANSETRON HCL 4 MG/2ML IJ SOLN: 4.0000 mg | Freq: Four times a day (QID) | INTRAMUSCULAR | Status: DC | PRN

## 2017-06-19 MED ORDER — ACETAMINOPHEN 325 MG PO TABS
650.0000 mg | ORAL_TABLET | Freq: Four times a day (QID) | ORAL | Status: DC | PRN
  Administered 2017-06-19 – 2017-06-20 (×3): 650 mg via ORAL
  Filled 2017-06-19 (×3): qty 2

## 2017-06-19 MED ORDER — VANCOMYCIN HCL IN DEXTROSE 1-5 GM/200ML-% IV SOLN
1000.0000 mg | Freq: Two times a day (BID) | INTRAVENOUS | Status: DC
  Administered 2017-06-19 – 2017-06-20 (×2): 1000 mg via INTRAVENOUS
  Filled 2017-06-19 (×2): qty 200

## 2017-06-19 MED ORDER — POTASSIUM CHLORIDE IN NACL 20-0.45 MEQ/L-% IV SOLN
INTRAVENOUS | Status: AC
  Filled 2017-06-19: qty 1000

## 2017-06-19 MED ORDER — DEXTROSE 5 % IV SOLN: 2.0000 g | Freq: Three times a day (TID) | INTRAVENOUS | Status: DC

## 2017-06-19 MED ORDER — PANTOPRAZOLE SODIUM 40 MG PO TBEC
40.0000 mg | DELAYED_RELEASE_TABLET | Freq: Every day | ORAL | Status: DC
  Administered 2017-06-19 – 2017-06-21 (×3): 40 mg via ORAL
  Filled 2017-06-19 (×3): qty 1

## 2017-06-19 MED ORDER — MAGNESIUM SULFATE 50 % IJ SOLN
3.0000 g | Freq: Once | INTRAMUSCULAR | Status: AC
  Administered 2017-06-19: 3 g via INTRAVENOUS
  Filled 2017-06-19: qty 6

## 2017-06-19 MED ORDER — DEXTROSE 5 % IV SOLN: 1.0000 g | Freq: Three times a day (TID) | INTRAVENOUS | Status: DC

## 2017-06-19 NOTE — Progress Notes (Signed)
06/18/2017 11:17 PM  06/19/2017 8:39 AM  Sydney Blair was seen and examined.  Pt is being treated for hypokalemia.  Continue IV abx.  Hopefully can go home in next 1-2 days. The H&P by the admitting provider, orders, imaging was reviewed.  Please see new orders.  Will continue to follow.   Murvin Natal, MD [facility]

## 2017-06-19 NOTE — H&P (Signed)
History and Physical    LAINIE DAUBERT SHF:[id number] DOB: Jun 01, 1956 DOA: 06/18/2017  PCP: [name], MD   Patient coming from: Home.  I have personally briefly reviewed patient's old medical records in [place]  Chief Complaint: Hemoptysis.  HPI: Sydney Blair is a 61 y.o. female with medical history significant of anxiety, history of renal failure, osteoarthritis, breast cancer, stage III lung cancer last chemotherapy in August 20/2018 and radiation therapy, COPD, active tobacco use, chronic back pain, fibromyalgia, depression with anxiety, GERD, hyperlipidemia, hypothyroidism, peripheral neuropathy, sleep apnea on CPAP who is coming to the emergency department with complaints of hemoptysis since yesterday evening at around 2215. Associated symptoms have been fatigued for the past few days, chills, sore throat, decreased appetite, headache for the past week and wheezing since yesterday.  ED Course: Initial vital signs temperature 98.38F, pulse 99, blood pressure 128/66 mmHg, respirations 18 and O2 sat 92% on nasal cannula oxygen. The patient received supplemental oxygen, bronchodilators, vancomycin and aztreonam.  Hemoglobin was 9.7 g/dL, WBC 7.8 with 84% neutrophils and platelets 184. Her CMP shows a potassium of 3.0 mmol/L, glucose of 125 and ALT of 7 U/L. magnesium was 1.5 mg/dL all other chemistry values were unremarkable.  Imaging: Chest radiograph showed right upper lobe cavitary lung mass with interval increase in opacity within the cavitary lesion in his periphery. There was adjacent groundglass opacity in the right upper lobe which may be associated pneumonia.   CT angiogram of chest did not show acute pulmonary embolism. There were scattered tree-in-bud infiltrates may be infectious or inflammatory, lymphangitic spread of tumor not excluded. Diffuse bronchial wall thickening. Smaller RIGHT upper lobe carcinoma, now necrotic/cavitated.Decreased size of nodal  metastasis. Chronic LEFT hydronephrosis. Please see images and full radiology report for further detail.  Review of Systems: As per HPI otherwise 10 point review of systems negative.    Past Medical History:  Diagnosis Date  . Anxiety   . ARF (acute renal failure) ([place]) 08/07/2011   ?   Marland Kitchen Arthritis   . Cancer ([place]) 2005   breast-right  . Chronic back pain   . COPD (chronic obstructive pulmonary disease) ([place])   . Depression   . Depression with anxiety   . Dyspnea    on exertion  . Fibromyalgia   . GERD (gastroesophageal reflux disease)   . History of radiation therapy 03/24/17-05/12/17   right lun 2 Gy in 30 fractions  . Hypercholesteremia   . Hypothyroidism   . Hypothyroidism 06/19/2017  . Incontinence   . Neuropathy   . Peripheral neuropathy   . Sciatic pain   . Sebaceous cyst    lt labial location  . Sleep apnea    uses CPAP  . Stage III squamous cell carcinoma of right lung ([place]) 03/10/2017  . Tobacco abuse     Past Surgical History:  Procedure Laterality Date  . bladder tack    . BREAST BIOPSY Right 12/29/2016   Procedure: RIGHT BREAST BIOPSY;  Surgeon: Aviva Signs, MD;  Location: [facility];  Service: General;  Laterality: Right;  . BREAST LUMPECTOMY     right with node dissection  . COLONOSCOPY  11/11/2010   FOY:DXAJOINOMV rectal polyp and splenic flexure otherwise normal; pathology showed  tubular adenomas. Next TCS 10/2015.  Marland Kitchen ESOPHAGOGASTRODUODENOSCOPY  11/11/2010   EHM:CNOBS hiatal hernia, 39 French dilator  . ESOPHAGOGASTRODUODENOSCOPY (EGD) WITH ESOPHAGEAL DILATION N/A 06/21/2013   JGG:EZMOQH esophagus  - status post Maloney dilation s/p bx (benign esophageal bx)  .  VIDEO BRONCHOSCOPY WITH ENDOBRONCHIAL ULTRASOUND N/A 02/28/2017   Procedure: VIDEO BRONCHOSCOPY WITH ENDOBRONCHIAL ULTRASOUND;  Surgeon: Juanito Doom, MD;  Location: [facility];  Service: Thoracic;  Laterality: N/A;     reports that she has been smoking Cigarettes.  She has a 35.00 pack-year  smoking history. She has never used smokeless tobacco. She reports that she does not drink alcohol or use drugs.  Allergies  Allergen Reactions  . Penicillins Anaphylaxis and Other (See Comments)    Has patient had a PCN reaction causing immediate rash, facial/tongue/throat swelling, SOB or lightheadedness with hypotension:Yes Has patient had a PCN reaction causing severe rash involving mucus membranes or skin necrosis:Yes Has patient had a PCN reaction that required hospitalization:No Has patient had a PCN reaction occurring within the last 10 years:No If all of the above answers are "NO", then may proceed with Cephalosporin use.     Family History  Problem Relation Age of Onset  . Cancer Other   . Heart attack Other   . Lung disease Other   . Emphysema Mother   . Autoimmune disease Neg Hx     Prior to Admission medications   Medication Sig Start Date End Date Taking? Authorizing Provider  albuterol (PROVENTIL HFA;VENTOLIN HFA) 108 (90 BASE) MCG/ACT inhaler Inhale 2 puffs into the lungs every 6 (six) hours as needed for wheezing or shortness of breath.   Yes [provider]  aspirin 81 MG chewable tablet Chew 81 mg by mouth daily.   Yes [provider]  budesonide-formoterol (SYMBICORT) 160-4.5 MCG/ACT inhaler Inhale 2 puffs into the lungs daily.    Yes [provider]  CALCIUM PO Take 1 tablet by mouth daily.   Yes [provider]  dexlansoprazole (DEXILANT) 60 MG capsule Take 1 capsule (60 mg total) by mouth daily before breakfast. 09/06/16  Yes Mahala Menghini, PA-C  diclofenac sodium (VOLTAREN) 1 % GEL Apply 4 g topically 4 (four) times daily.   Yes [provider]  doxycycline (VIBRA-TABS) 100 MG tablet Take 1 tablet (100 mg total) by mouth every 12 (twelve) hours. 05/21/17  Yes Regalado, Belkys A, MD  DULoxetine (CYMBALTA) 60 MG capsule Take 60 mg by mouth daily.   Yes [provider]  ferrous sulfate 325 (65 FE) MG tablet  Take 1 tablet (325 mg total) by mouth 2 (two) times daily with a meal. 05/21/17  Yes Regalado, Belkys A, MD  fluticasone (FLONASE) 50 MCG/ACT nasal spray Place 2 sprays into both nostrils daily.   Yes [provider]  gabapentin (NEURONTIN) 400 MG capsule Take 1 capsule (400 mg total) by mouth 3 (three) times daily. 05/21/17  Yes Regalado, Belkys A, MD  levalbuterol (XOPENEX) 0.63 MG/3ML nebulizer solution Take 3 mLs (0.63 mg total) by nebulization 3 (three) times daily. 05/21/17  Yes Regalado, Belkys A, MD  levothyroxine (SYNTHROID, LEVOTHROID) 100 MCG tablet Take 100 mcg by mouth daily before breakfast. Reported on 02/03/2016   Yes [provider]  magnesium oxide (MAG-OX) 400 (241.3 Mg) MG tablet Take 1 tablet (400 mg total) by mouth 2 (two) times daily. 05/21/17  Yes Regalado, Belkys A, MD  metoprolol tartrate (LOPRESSOR) 50 MG tablet Take 1 tablet (50 mg total) by mouth 3 (three) times daily. 05/21/17  Yes Regalado, Belkys A, MD  olopatadine (PATANOL) 0.1 % ophthalmic solution Place 1 drop into both eyes 2 (two) times daily.    Yes [provider]  Tapentadol HCl (NUCYNTA) 100 MG TABS Take 1 tablet (100 mg  total) by mouth every morning. 05/21/17  Yes Regalado, Belkys A, MD  tiotropium (SPIRIVA) 18 MCG inhalation capsule Place 1 capsule (18 mcg total) into inhaler and inhale daily. 01/31/15  Yes Clance, Armando Reichert, MD  TRINTELLIX 10 MG TABS Take 10 mg by mouth daily. 07/21/16  Yes [provider]  Wound Dressings (SONAFINE) Apply 1 application topically 3 (three) times daily.   Yes [provider]    Physical Exam: Vitals:   06/19/17 4008 06/19/17 0305 06/19/17 0337 06/19/17 0345  BP: (!) 145/71 100/67  (!) 126/53  Pulse: 94 96  96  Resp: 18 17  18   Temp:    99.1 F (37.3 C)  TempSrc:    Oral  SpO2: 95% 99% 97% 97%  Weight:    70 kg (154 lb 5.2 oz)  Height:    5' (1.524 m)    Constitutional: Looks chronically ill, otherwise in NAD, calm,  comfortable Eyes: PERRL, lids and conjunctivae normal ENMT: Mucous membranes are moist. Posterior pharynx clear of any exudate or lesions. Neck: normal, supple, no masses, no thyromegaly Respiratory: Decreased breath sounds on RUL with scattered rhonchi bilaterally, no wheezing, no crackles. Normal respiratory effort. No accessory muscle use.  Cardiovascular: Regular rate and rhythm, no murmurs / rubs / gallops. No extremity edema. 2+ pedal pulses. No carotid bruits.  Abdomen: Soft, no tenderness, no masses palpated. No hepatosplenomegaly. Bowel sounds positive.  Musculoskeletal: no clubbing / cyanosis.Good ROM, no contractures. Normal muscle tone.  Skin: no significant rashes, lesions, ulcers on limited skin exam Neurologic: CN 2-12 grossly intact. Sensation intact, DTR normal. Strength 5/5 in all 4.  Psychiatric: Normal judgment and insight. Alert and oriented x 4.. Normal mood.    Labs on Admission: I have personally reviewed following labs and imaging studies  CBC:  Recent Labs Lab 06/19/17 0018  WBC 7.8  NEUTROABS 6.5  HGB 9.7*  HCT 28.7*  MCV 100.7*  PLT 676   Basic Metabolic Panel:  Recent Labs Lab 06/19/17 0018  NA 142  K 3.0*  CL 101  CO2 29  GLUCOSE 125*  BUN 12  CREATININE 0.67  CALCIUM 9.1  MG 1.5*   GFR: Estimated Creatinine Clearance: 66.1 mL/min (by C-G formula based on SCr of 0.67 mg/dL). Liver Function Tests:  Recent Labs Lab 06/19/17 0018  AST 15  ALT 7*  ALKPHOS 69  BILITOT 0.8  PROT 7.2  ALBUMIN 3.6   No results for input(s): LIPASE, AMYLASE in the last 168 hours. No results for input(s): AMMONIA in the last 168 hours. Coagulation Profile: No results for input(s): INR, PROTIME in the last 168 hours. Cardiac Enzymes: No results for input(s): CKTOTAL, CKMB, CKMBINDEX, TROPONINI in the last 168 hours. BNP (last 3 results) No results for input(s): PROBNP in the last 8760 hours. HbA1C: No results for input(s): HGBA1C in the last 72  hours. CBG: No results for input(s): GLUCAP in the last 168 hours. Lipid Profile: No results for input(s): CHOL, HDL, LDLCALC, TRIG, CHOLHDL, LDLDIRECT in the last 72 hours. Thyroid Function Tests: No results for input(s): TSH, T4TOTAL, FREET4, T3FREE, THYROIDAB in the last 72 hours. Anemia Panel: No results for input(s): VITAMINB12, FOLATE, FERRITIN, TIBC, IRON, RETICCTPCT in the last 72 hours. Urine analysis:    Component Value Date/Time   COLORURINE YELLOW 05/16/2017 1410   APPEARANCEUR CLEAR 05/16/2017 1410   LABSPEC 1.004 (L) 05/16/2017 1410   PHURINE 6.0 05/16/2017 1410   GLUCOSEU NEGATIVE 05/16/2017 1410   HGBUR NEGATIVE 05/16/2017 1410  HGBUR negative 02/26/2008 1027   BILIRUBINUR NEGATIVE 05/16/2017 1410   KETONESUR 5 (A) 05/16/2017 1410   PROTEINUR NEGATIVE 05/16/2017 1410   UROBILINOGEN 0.2 06/20/2012 1231   NITRITE NEGATIVE 05/16/2017 1410   LEUKOCYTESUR NEGATIVE 05/16/2017 1410    Radiological Exams on Admission: Dg Chest 2 View  Result Date: 06/19/2017 CLINICAL DATA:  History of lung cancer, coughing up blood EXAM: CHEST  2 VIEW COMPARISON:  06/02/2017, 11/2016, 12/08/2016 FINDINGS: Right upper lobe cavitary mass with increased opacity within the cavitary portion an at the periphery of the mass. Ground-glass opacity surrounding the mass in the right upper lobe. Possible tiny pleural effusions. Stable cardiomediastinal silhouette. No pneumothorax. IMPRESSION: 1. Right upper lobe cavitary lung mass re- demonstrated with interval increase in opacity within the cavitary lesion and at its periphery. Adjacent ground-glass opacity in the right upper lobe may reflect associated pneumonia. Possible small effusions. Electronically Signed   By: Donavan Foil M.D.   On: 06/19/2017 00:27   Ct Angio Chest Pe W And/or Wo Contrast  Result Date: 06/19/2017 CLINICAL DATA:  Hemoptysis.  History of lung and breast cancer. EXAM: CT ANGIOGRAPHY CHEST WITH CONTRAST TECHNIQUE:  Multidetector CT imaging of the chest was performed using the standard protocol during bolus administration of intravenous contrast. Multiplanar CT image reconstructions and MIPs were obtained to evaluate the vascular anatomy. CONTRAST:  100 cc Isovue 370 COMPARISON:  Chest radiograph June 18, 2017 at 2358 hours and PET CT February 11, 2017 FINDINGS: CARDIOVASCULAR: Adequate contrast opacification of the pulmonary artery's. Main pulmonary artery is not enlarged. No pulmonary arterial filling defects to the level of the subsegmental branches. Heart size is normal, no right heart strain. No pericardial effusion. Thoracic aorta is normal course and caliber, mild calcific atherosclerosis. MEDIASTINUM/NODES: Scattered calcified subcentimeter lymph nodes. Decreased size of RIGHT hilar and anterior mediastinal lymphadenopathy. LUNGS/PLEURA: Necrotic, cavitated spiculated 3.8 x 4.9 cm RIGHT upper lobe mass was 4.6 x 5.5 cm. Mass encases the RIGHT upper lobe pulmonary artery which remains patent. Narrowed RIGHT upper lobe bronchus. Diffuse bronchial wall thickening. Focal RIGHT lower lobe bronchiectasis and mucoid impaction. Biapical bullous changes in RIGHT pleural thickening. Tree-in-bud infiltrates bilateral upper lobes, to lesser extent RIGHT middle RIGHT lower lobe. Scattered punctate calcified granulomas. Mild centrilobular emphysema. No pleural effusion. UPPER ABDOMEN: Unchanged LEFT hydro nephrosis and bilateral extrarenal pelvises. No adrenal mass. MUSCULOSKELETAL: Visualized soft tissues and included osseous structures are nonacute . Skin thickening coarse calcifications RIGHT breast are improved. Old RIGHT posterior rib fractures. Surgical clips RIGHT axilla consistent with nodal dissection. Moderate canal stenosis C6-7 due to large disc osteophyte complex. Review of the MIP images confirms the above findings. IMPRESSION: 1. No acute pulmonary embolism. 2. Scattered tree-in-bud infiltrates may be infectious or  inflammatory, lymphangitic spread of tumor not excluded. Diffuse bronchial wall thickening. Low 3. Smaller RIGHT upper lobe carcinoma, now necrotic/cavitated. Decreased size of nodal metastasis. 4. Chronic LEFT hydronephrosis. Aortic Atherosclerosis (ICD10-I70.0) and Emphysema (ICD10-J43.9). Electronically Signed   By: Elon Alas M.D.   On: 06/19/2017 01:46    EKG: Independently reviewed.   Assessment/Plan Principal Problem:   HCAP (healthcare-associated pneumonia) Admitted to telemetry/inpatient. Continue supplemental oxygen. Continue CPAP for sleep apnea Bronchodilators as needed. Vancomycin per pharmacy and aztreonam. Follow-up blood cultures and sensitivity. Check sputum Gram stain, culture and sensitivity. Check strep pneumoniae urinary antigen.  Active Problems:   TOBACCO ABUSE Nicotine replacement therapy ordered. Nursing staff to provide tobacco cessation information.    GERD Protonix 40 mg by mouth daily.  Hypokalemia Replacing. Supplemental magnesium. Follow-up potassium level.    Stage III squamous cell carcinoma of right lung [facility]) She last received chemotherapy in late August. Follow-up with oncology as scheduled.    COPD (chronic obstructive pulmonary disease) (HCC) Continue supplemental oxygen. Bronchodilators as needed.    Hypothyroidism Continue levothyroxine 100 g by mouth daily. TSH monitoring as needed.    Hypomagnesemia Replacing.    DVT prophylaxis: SCDs. Code Status: Full code. Family Communication:  Disposition Plan: Admit for IV antibiotics for 2-3 days. Consults called:  Admission status: Inpatient/telemetry.   Reubin Milan MD [facility] Pager [phone].  If 7PM-7AM, please contact night-coverage [email or web address] Password [email or web address]  06/19/2017, 7:20 AM   This document was prepared using track him voice recognition software and may contain some unintended errors.

## 2017-06-19 NOTE — Progress Notes (Signed)
Spoke with patient about wearing a CPAP and she declined use of it tonight but would like to try tomorrow evening. She mention she hasn't worn her unit at home for awhile since its being serviced at this time. Patient is wearing O2 at 2 lpm [place] and will continue to be monitored through the night.

## 2017-06-19 NOTE — Progress Notes (Signed)
Pharmacy Antibiotic Note  Sydney Blair is a 61 y.o. female admitted on 06/18/2017 with pneumonia.  Pharmacy has been consulted for Vancomycin dosing.  Plan: Vancomycin 1000mg  IV every 12 hours.  Goal trough 15-20 mcg/mL.  Also, on Aztreonam 1gm IV q8h F/U cxs and clinical progress Monitor V/S, labs, and levels as indicated  Height: 5' (152.4 cm) Weight: 154 lb 5.2 oz (70 kg) IBW/kg (Calculated) : 45.5  Temp (24hrs), Avg:99 F (37.2 C), Min:98.8 F (37.1 C), Max:99.1 F (37.3 C)   Recent Labs Lab 06/19/17 0018 06/19/17 0617  WBC 7.8  --   CREATININE 0.67 0.59    Estimated Creatinine Clearance: 66.1 mL/min (by C-G formula based on SCr of 0.59 mg/dL).    Allergies  Allergen Reactions  . Penicillins Anaphylaxis and Other (See Comments)    Has patient had a PCN reaction causing immediate rash, facial/tongue/throat swelling, SOB or lightheadedness with hypotension:Yes Has patient had a PCN reaction causing severe rash involving mucus membranes or skin necrosis:Yes Has patient had a PCN reaction that required hospitalization:No Has patient had a PCN reaction occurring within the last 10 years:No If all of the above answers are "NO", then may proceed with Cephalosporin use.     Antimicrobials this admission: Vancomycin 10/28>> Aztreonam 10/28  >>   Dose adjustments this admission: N/A  Microbiology results: 10/28 BCx: pending  10/28 UCx: pending 9/25 Sputum: MSSA  9/25 MRSA PCR: negatvie  Thank you for allowing pharmacy to be a part of this patient's care.  Isac Sarna, BS [place], [place] Clinical Pharmacist Pager [phone] 06/19/2017 12:26 PM

## 2017-06-19 NOTE — ED Provider Notes (Signed)
[facility] EMERGENCY DEPARTMENT Provider Note   CSN: [id number] Arrival date & time: 06/18/17  2317     History   Chief Complaint Chief Complaint  Patient presents with  . Hemoptysis    HPI Sydney Blair is a 61 y.o. female.  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  She presents today for evaluation of hemoptysis.  She began coughing earlier this afternoon and it has since become bloody.  She denies any chest pain or difficulty breathing.  She denies any fevers or chills.  She does report being hospitalized recently for pneumonia, however did not have hemoptysis with that particular illness.   The history is provided by the patient.    Past Medical History:  Diagnosis Date  . Anxiety   . ARF (acute renal failure) ([place]) 08/07/2011   ?   Marland Kitchen Arthritis   . Cancer ([place]) 2005   breast-right  . Chronic back pain   . COPD (chronic obstructive pulmonary disease) ([place])   . Depression   . Depression with anxiety   . Dyspnea    on exertion  . Fibromyalgia   . GERD (gastroesophageal reflux disease)   . History of radiation therapy 03/24/17-05/12/17   right lun 2 Gy in 30 fractions  . Hypercholesteremia   . Hypothyroidism   . Incontinence   . Neuropathy   . Peripheral neuropathy   . Sciatic pain   . Sebaceous cyst    lt labial location  . Sleep apnea    uses CPAP  . Stage III squamous cell carcinoma of right lung ([place]) 03/10/2017  . Tobacco abuse     Patient Active Problem List   Diagnosis Date Noted  . Encounter for antineoplastic immunotherapy 06/06/2017  . Chronic fatigue 06/06/2017  . CAP (community acquired pneumonia) 05/26/2017  . Pressure injury of skin 05/18/2017  . Hepatitis 05/16/2017  . Nausea vomiting and diarrhea 05/16/2017  . SIRS (systemic inflammatory response syndrome) ([place]) 05/16/2017  . COPD (chronic obstructive pulmonary disease) ([place])  05/16/2017  . Abnormal CXR 05/16/2017  . Anemia 05/16/2017  . Thrombocytopenia ([place]) 05/16/2017  . Depression 05/16/2017  . Anxiety 05/16/2017  . Chronic pain 05/16/2017  . Acute on chronic respiratory failure ([place]) 05/16/2017  . Stage III squamous cell carcinoma of right lung ([place]) 03/10/2017  . Goals of care, counseling/discussion 03/10/2017  . Encounter for antineoplastic chemotherapy 03/10/2017  . Lung mass 02/01/2017  . Breast mass, right   . Hx of adenomatous colonic polyps 12/28/2016  . Fracture of fifth metatarsal bone of right foot with delayed healing 02/03/2016  . IBS (irritable bowel syndrome) 07/30/2015  . Diarrhea 10/29/2014  . Hoarseness 10/29/2014  . Chronic hoarseness 01/16/2014  . Other dysphagia 01/16/2014  . Dysphagia, unspecified(787.20) 06/04/2013  . Unspecified constipation 06/04/2013  . Unsteady gait 01/25/2013  . COPD (chronic obstructive pulmonary disease) with emphysema ([place]) 12/26/2012  . DOE (dyspnea on exertion) 12/01/2012  . 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 Dysphagia 11/09/2012  . COPD exacerbation ([place]) 08/07/2011  . Hypokalemia 08/07/2011  . Myositis 08/07/2011  . Dehydration 08/07/2011  . ARF (acute renal failure) ([place]) 08/07/2011  . Tubular adenoma 07/20/2011  . INSOMNIA 02/26/2008  . CERUMEN IMPACTION, RIGHT 01/23/2008  . 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 NECK PAIN 11/22/2007  . HAMMER TOE 11/22/2007  . ANXIETY 01/30/2007  . TOBACCO ABUSE 01/30/2007  . DEPRESSION 01/30/2007  .  GERD 01/30/2007  . PROLAPSE, VAGINAL WALL, CYSTOCELE, MIDLINE 01/10/2007  . INCONTINENCE 01/10/2007  . SEBACEOUS CYST 08/17/2006  . Breast cancer, right breast ([place]) 12/01/2003    Past Surgical History:  Procedure Laterality Date  . bladder tack    . BREAST BIOPSY Right 12/29/2016   Procedure: RIGHT BREAST BIOPSY;  Surgeon: Aviva Signs, MD;  Location: [facility];  Service: General;  Laterality: Right;    . BREAST LUMPECTOMY     right with node dissection  . COLONOSCOPY  11/11/2010   [id number] rectal polyp and splenic flexure otherwise normal; pathology showed  tubular adenomas. Next TCS 10/2015.  Marland Kitchen ESOPHAGOGASTRODUODENOSCOPY  11/11/2010   JXB:JYNWG hiatal hernia, 99 French dilator  . ESOPHAGOGASTRODUODENOSCOPY (EGD) WITH ESOPHAGEAL DILATION N/A 06/21/2013   [id number] esophagus  - status post Maloney dilation s/p bx (benign esophageal bx)  . VIDEO BRONCHOSCOPY WITH ENDOBRONCHIAL ULTRASOUND N/A 02/28/2017   Procedure: VIDEO BRONCHOSCOPY WITH ENDOBRONCHIAL ULTRASOUND;  Surgeon: Juanito Doom, MD;  Location: [facility];  Service: Thoracic;  Laterality: N/A;    OB History    No data available       Home Medications    Prior to Admission medications   Medication Sig Start Date End Date Taking? Authorizing Provider  albuterol (PROVENTIL HFA;VENTOLIN HFA) 108 (90 BASE) MCG/ACT inhaler Inhale 2 puffs into the lungs every 6 (six) hours as needed for wheezing or shortness of breath.   Yes [provider]  aspirin 81 MG chewable tablet Chew 81 mg by mouth daily.   Yes [provider]  budesonide-formoterol (SYMBICORT) 160-4.5 MCG/ACT inhaler Inhale 2 puffs into the lungs daily.    Yes [provider]  CALCIUM PO Take 1 tablet by mouth daily.   Yes [provider]  dexlansoprazole (DEXILANT) 60 MG capsule Take 1 capsule (60 mg total) by mouth daily before breakfast. 09/06/16  Yes Mahala Menghini, PA-C  diclofenac sodium (VOLTAREN) 1 % GEL Apply 4 g topically 4 (four) times daily.   Yes [provider]  doxycycline (VIBRA-TABS) 100 MG tablet Take 1 tablet (100 mg total) by mouth every 12 (twelve) hours. 05/21/17  Yes Regalado, Belkys A, MD  DULoxetine (CYMBALTA) 60 MG capsule Take 60 mg by mouth daily.   Yes [provider]  ferrous sulfate 325 (65 FE) MG tablet Take 1 tablet (325 mg total) by mouth 2 (two) times daily with a meal. 05/21/17  Yes  Regalado, Belkys A, MD  fluticasone (FLONASE) 50 MCG/ACT nasal spray Place 2 sprays into both nostrils daily.   Yes [provider]  gabapentin (NEURONTIN) 400 MG capsule Take 1 capsule (400 mg total) by mouth 3 (three) times daily. 05/21/17  Yes Regalado, Belkys A, MD  levalbuterol (XOPENEX) 0.63 MG/3ML nebulizer solution Take 3 mLs (0.63 mg total) by nebulization 3 (three) times daily. 05/21/17  Yes Regalado, Belkys A, MD  levothyroxine (SYNTHROID, LEVOTHROID) 100 MCG tablet Take 100 mcg by mouth daily before breakfast. Reported on 02/03/2016   Yes [provider]  magnesium oxide (MAG-OX) 400 (241.3 Mg) MG tablet Take 1 tablet (400 mg total) by mouth 2 (two) times daily. 05/21/17  Yes Regalado, Belkys A, MD  metoprolol tartrate (LOPRESSOR) 50 MG tablet Take 1 tablet (50 mg total) by mouth 3 (three) times daily. 05/21/17  Yes Regalado, Belkys A, MD  olopatadine (PATANOL) 0.1 % ophthalmic solution Place 1 drop into both eyes 2 (two) times daily.    Yes [provider]  Tapentadol HCl (NUCYNTA) 100 MG TABS  Take 1 tablet (100 mg total) by mouth every morning. 05/21/17  Yes Regalado, Belkys A, MD  tiotropium (SPIRIVA) 18 MCG inhalation capsule Place 1 capsule (18 mcg total) into inhaler and inhale daily. 01/31/15  Yes Clance, Armando Reichert, MD  TRINTELLIX 10 MG TABS Take 10 mg by mouth daily. 07/21/16  Yes [provider]  Wound Dressings (SONAFINE) Apply 1 application topically 3 (three) times daily.   Yes [provider]    Family History Family History  Problem Relation Age of Onset  . Cancer Other   . Heart attack Other   . Lung disease Other   . Emphysema Mother   . Autoimmune disease Neg Hx     Social History Social History  Substance Use Topics  . Smoking status: Current Every Day Smoker    Packs/day: 1.00    Years: 35.00    Types: Cigarettes  . Smokeless tobacco: Never Used     Comment: has cut back to one pack daily  . Alcohol use No      Allergies   Penicillins   Review of Systems Review of Systems  All other systems reviewed and are negative.    Physical Exam Updated Vital Signs BP 128/66 (BP Location: Left Arm)   Pulse 99   Temp 98.8 F (37.1 C) (Oral)   Ht 5' (1.524 m)   Wt 69.9 kg (154 lb)   SpO2 92%   BMI 30.08 kg/m   Physical Exam  Constitutional: She is oriented to person, place, and time. She appears well-developed and well-nourished. No distress.  HENT:  Head: Normocephalic and atraumatic.  Mouth/Throat: Oropharynx is clear and moist.  Neck: Normal range of motion. Neck supple.  Cardiovascular: Normal rate and regular rhythm.  Exam reveals no gallop and no friction rub.   No murmur heard. Pulmonary/Chest: Effort normal and breath sounds normal. No respiratory distress. She has no wheezes.  Abdominal: Soft. Bowel sounds are normal. She exhibits no distension. There is no tenderness.  Musculoskeletal: Normal range of motion. She exhibits no edema.  There is no calf swelling, tenderness, or edema.  Homans sign is absent bilaterally.  Neurological: She is alert and oriented to person, place, and time.  Skin: Skin is warm and dry. She is not diaphoretic.  Nursing note and vitals reviewed.    ED Treatments / Results  Labs (all labs ordered are listed, but only abnormal results are displayed) Labs Reviewed  COMPREHENSIVE METABOLIC PANEL  CBC WITH DIFFERENTIAL/PLATELET    EKG  EKG Interpretation None       Radiology No results found.  Procedures Procedures (including critical care time)  Medications Ordered in ED Medications  sodium chloride 0.9 % bolus 500 mL (not administered)     Initial Impression / Assessment and Plan / ED Course  I have reviewed the triage vital signs and the nursing notes.  Pertinent labs & imaging results that were available during my care of the patient were reviewed by me and considered in my medical decision making (see chart for  details).  Patient presenting with hemoptysis in the setting of lung cancer.  I was initially concerned about the possibility of a pulmonary embolism, however her CT scan does not show this.  It does show the suggestion of pneumonia.  She will be given vancomycin and aztreonam and admitted to the hospitalist service for further antibiotics and observation.  Dr. Olevia Bowens agrees to admit.  Final Clinical Impressions(s) / ED Diagnoses   Final diagnoses:  None  New Prescriptions New Prescriptions   No medications on file     Veryl Speak, MD 06/19/17 [phone]

## 2017-06-20 ENCOUNTER — Other Ambulatory Visit: Payer: Medicaid Other

## 2017-06-20 ENCOUNTER — Ambulatory Visit: Payer: Medicaid Other

## 2017-06-20 ENCOUNTER — Telehealth: Payer: Self-pay

## 2017-06-20 ENCOUNTER — Inpatient Hospital Stay (HOSPITAL_COMMUNITY): Payer: Medicaid Other

## 2017-06-20 ENCOUNTER — Ambulatory Visit: Payer: Medicaid Other | Admitting: Oncology

## 2017-06-20 DIAGNOSIS — J449 Chronic obstructive pulmonary disease, unspecified: Secondary | ICD-10-CM

## 2017-06-20 DIAGNOSIS — K219 Gastro-esophageal reflux disease without esophagitis: Secondary | ICD-10-CM

## 2017-06-20 DIAGNOSIS — E039 Hypothyroidism, unspecified: Secondary | ICD-10-CM

## 2017-06-20 DIAGNOSIS — C3491 Malignant neoplasm of unspecified part of right bronchus or lung: Secondary | ICD-10-CM

## 2017-06-20 DIAGNOSIS — F17200 Nicotine dependence, unspecified, uncomplicated: Secondary | ICD-10-CM

## 2017-06-20 DIAGNOSIS — E876 Hypokalemia: Secondary | ICD-10-CM

## 2017-06-20 LAB — CBC WITH DIFFERENTIAL/PLATELET
BASOS PCT: 1 %
Basophils Absolute: 0 10*3/uL (ref 0.0–0.1)
EOS ABS: 0.1 10*3/uL (ref 0.0–0.7)
EOS PCT: 2 %
HCT: 28.9 % — ABNORMAL LOW (ref 36.0–46.0)
HEMOGLOBIN: 9.3 g/dL — AB (ref 12.0–15.0)
LYMPHS ABS: 0.8 10*3/uL (ref 0.7–4.0)
Lymphocytes Relative: 14 %
MCH: 33.1 pg (ref 26.0–34.0)
MCHC: 32.2 g/dL (ref 30.0–36.0)
MCV: 102.8 fL — ABNORMAL HIGH (ref 78.0–100.0)
Monocytes Absolute: 0.5 10*3/uL (ref 0.1–1.0)
Monocytes Relative: 8 %
NEUTROS PCT: 77 %
Neutro Abs: 4.6 10*3/uL (ref 1.7–7.7)
Platelets: 187 10*3/uL (ref 150–400)
RBC: 2.81 MIL/uL — AB (ref 3.87–5.11)
RDW: 15.7 % — ABNORMAL HIGH (ref 11.5–15.5)
WBC: 5.9 10*3/uL (ref 4.0–10.5)

## 2017-06-20 LAB — BASIC METABOLIC PANEL
Anion gap: 8 (ref 5–15)
BUN: 9 mg/dL (ref 6–20)
CALCIUM: 8.8 mg/dL — AB (ref 8.9–10.3)
CHLORIDE: 104 mmol/L (ref 101–111)
CO2: 28 mmol/L (ref 22–32)
CREATININE: 0.53 mg/dL (ref 0.44–1.00)
Glucose, Bld: 105 mg/dL — ABNORMAL HIGH (ref 65–99)
Potassium: 3.7 mmol/L (ref 3.5–5.1)
SODIUM: 140 mmol/L (ref 135–145)

## 2017-06-20 LAB — MAGNESIUM: MAGNESIUM: 2 mg/dL (ref 1.7–2.4)

## 2017-06-20 MED ORDER — HYDROCODONE-ACETAMINOPHEN 5-325 MG PO TABS: 1.0000 | ORAL_TABLET | ORAL | Status: DC | PRN

## 2017-06-20 MED ORDER — VANCOMYCIN HCL IN DEXTROSE 1-5 GM/200ML-% IV SOLN
1000.0000 mg | Freq: Two times a day (BID) | INTRAVENOUS | Status: DC
  Administered 2017-06-20 – 2017-06-21 (×2): 1000 mg via INTRAVENOUS
  Filled 2017-06-20 (×2): qty 200

## 2017-06-20 MED ORDER — TRAMADOL HCL 50 MG PO TABS
50.0000 mg | ORAL_TABLET | Freq: Four times a day (QID) | ORAL | Status: DC | PRN
  Administered 2017-06-20: 50 mg via ORAL
  Filled 2017-06-20: qty 1

## 2017-06-20 MED ORDER — KETOROLAC TROMETHAMINE 30 MG/ML IJ SOLN: 30.0000 mg | Freq: Four times a day (QID) | INTRAMUSCULAR | Status: DC | PRN

## 2017-06-20 NOTE — Progress Notes (Signed)
Patient still refusing CPAP tonight. Patient is on [facility]. No unit in room. Will continue to monitor.

## 2017-06-20 NOTE — Telephone Encounter (Signed)
Per Sydney Blair I sent request to HIM to transfer pt to [place].

## 2017-06-20 NOTE — Telephone Encounter (Signed)
Pt is in hospital with pneumonia.   She is having a hard time making it to [facility]. She is asking if her care can be transferred to [facility]. She is hoping this will be expedited to not miss any more treatments if possible.

## 2017-06-20 NOTE — Progress Notes (Signed)
PROGRESS NOTE    Sydney Blair  [facility]:[id number]  DOB: July 25, 1956  DOA: 06/18/2017 PCP: [name], MD  Brief Admission Hx: Sydney Blair is a 61 y.o. female with medical history significant of anxiety, history of renal failure, osteoarthritis, breast cancer, stage III lung cancer last chemotherapy in August 20/2018 and radiation therapy, COPD, active tobacco use, chronic back pain, fibromyalgia, depression with anxiety, GERD, hyperlipidemia, hypothyroidism, peripheral neuropathy, sleep apnea on CPAP who is coming to the emergency department with complaints of hemoptysis   MDM/Assessment & Plan:   1. HCAP - Pt says she is feeling a little better, still having hemoptysis but much less.  Coughing is less.  Plan to do 1 more day of broad spectrum IV abx and hopefully can de-escalate tomorrow and possibly discharge home.  2. Hemoptysis - much improved per patient but not totally resolved, now only blood tinged sputum seen, likely related to pneumonia, follow clinically.  3. Tobacco abuse - strongly counseled and encouraged patient to please stop using all tobacco products.  4. GERD -Protonix ordered for GI protection. 5. Hypokalemia-repleted. 6. Stage 3 SCC right lung-patient has completed her radiation and chemotherapy treatments and will follow up with oncology. 7. COPD- stable. 8. Hypothyroidism-resume home medications. 9. Hypomagnesemia-repleted.  DVT prophylaxis: SCDs. Code Status: Full code. Consults called:  Admission status: Inpatient/telemetry       Possible discharge home in 1-2 days  Subjective: The patient reports that she is feeling better.  She is having much less hemoptysis.  She is only having a small amount of blood-tinged sputum.  She is ambulating in the room.  Objective: Vitals:   06/19/17 0345 06/19/17 1455 06/19/17 2127 06/20/17 0535  BP: (!) 126/53 (!) 126/56 (!) 125/37 (!) 136/53  Pulse: 96 84 75 78  Resp: 18 18 18 18   Temp: 99.1 F (37.3 C)  98.5 F (36.9 C) 98.4 F (36.9 C) 97.8 F (36.6 C)  TempSrc: Oral Oral Oral Oral  SpO2: 97% 96% 96% 96%  Weight: 70 kg (154 lb 5.2 oz)     Height: 5' (1.524 m)       Intake/Output Summary (Last 24 hours) at 06/20/17 0726 Last data filed at 06/20/17 0600  Gross per 24 hour  Intake          3049.75 ml  Output             1500 ml  Net          1549.75 ml   Filed Weights   06/18/17 2321 06/19/17 0345  Weight: 69.9 kg (154 lb) 70 kg (154 lb 5.2 oz)     REVIEW OF SYSTEMS  As per history otherwise all reviewed and reported negative  Exam:  General exam: Awake, alert, no distress, cooperative and pleasant Respiratory system: Rare expiratory wheeze heard. No increased work of breathing. Cardiovascular system: S1 & S2 heard, RRR.  Gastrointestinal system: Abdomen is nondistended, soft and nontender. Normal bowel sounds heard. Central nervous system: Alert and oriented. No focal neurological deficits. Extremities: no clubbing or cyanosis.  Data Reviewed: Basic Metabolic Panel:  Recent Labs Lab 06/19/17 0018 06/19/17 0617 06/20/17 0608  NA 142 142 140  K 3.0* 2.8* 3.7  CL 101 102 104  CO2 29 30 28   GLUCOSE 125* 108* 105*  BUN 12 9 9   CREATININE 0.67 0.59 0.53  CALCIUM 9.1 8.9 8.8*  MG 1.5* 1.6* 2.0   Liver Function Tests:  Recent Labs Lab 06/19/17 0018  AST 15  ALT  7*  ALKPHOS 69  BILITOT 0.8  PROT 7.2  ALBUMIN 3.6   No results for input(s): LIPASE, AMYLASE in the last 168 hours. No results for input(s): AMMONIA in the last 168 hours. CBC:  Recent Labs Lab 06/19/17 0018 06/20/17 0608  WBC 7.8 5.9  NEUTROABS 6.5 4.6  HGB 9.7* 9.3*  HCT 28.7* 28.9*  MCV 100.7* 102.8*  PLT 184 187   Cardiac Enzymes: No results for input(s): CKTOTAL, CKMB, CKMBINDEX, TROPONINI in the last 168 hours. CBG (last 3)  No results for input(s): GLUCAP in the last 72 hours. Recent Results (from the past 240 hour(s))  Culture, blood (routine x 2) Call MD if unable to  obtain prior to antibiotics being given     Status: None (Preliminary result)   Collection Time: 06/19/17  2:25 AM  Result Value Ref Range Status   Specimen Description BLOOD  Final   Special Requests Immunocompromised  Final   Culture NO GROWTH < 12 HOURS  Final   Report Status PENDING  Incomplete  Culture, blood (routine x 2) Call MD if unable to obtain prior to antibiotics being given     Status: None (Preliminary result)   Collection Time: 06/19/17  2:34 AM  Result Value Ref Range Status   Specimen Description BLOOD  Final   Special Requests Immunocompromised  Final   Culture NO GROWTH < 12 HOURS  Final   Report Status PENDING  Incomplete     Studies: Dg Chest 2 View  Result Date: 06/19/2017 CLINICAL DATA:  History of lung cancer, coughing up blood EXAM: CHEST  2 VIEW COMPARISON:  06/02/2017, 11/2016, 12/08/2016 FINDINGS: Right upper lobe cavitary mass with increased opacity within the cavitary portion an at the periphery of the mass. Ground-glass opacity surrounding the mass in the right upper lobe. Possible tiny pleural effusions. Stable cardiomediastinal silhouette. No pneumothorax. IMPRESSION: 1. Right upper lobe cavitary lung mass re- demonstrated with interval increase in opacity within the cavitary lesion and at its periphery. Adjacent ground-glass opacity in the right upper lobe may reflect associated pneumonia. Possible small effusions. Electronically Signed   By: Donavan Foil M.D.   On: 06/19/2017 00:27   Ct Angio Chest Pe W And/or Wo Contrast  Result Date: 06/19/2017 CLINICAL DATA:  Hemoptysis.  History of lung and breast cancer. EXAM: CT ANGIOGRAPHY CHEST WITH CONTRAST TECHNIQUE: Multidetector CT imaging of the chest was performed using the standard protocol during bolus administration of intravenous contrast. Multiplanar CT image reconstructions and MIPs were obtained to evaluate the vascular anatomy. CONTRAST:  100 cc Isovue 370 COMPARISON:  Chest radiograph June 18, 2017 at 2358 hours and PET CT February 11, 2017 FINDINGS: CARDIOVASCULAR: Adequate contrast opacification of the pulmonary artery's. Main pulmonary artery is not enlarged. No pulmonary arterial filling defects to the level of the subsegmental branches. Heart size is normal, no right heart strain. No pericardial effusion. Thoracic aorta is normal course and caliber, mild calcific atherosclerosis. MEDIASTINUM/NODES: Scattered calcified subcentimeter lymph nodes. Decreased size of RIGHT hilar and anterior mediastinal lymphadenopathy. LUNGS/PLEURA: Necrotic, cavitated spiculated 3.8 x 4.9 cm RIGHT upper lobe mass was 4.6 x 5.5 cm. Mass encases the RIGHT upper lobe pulmonary artery which remains patent. Narrowed RIGHT upper lobe bronchus. Diffuse bronchial wall thickening. Focal RIGHT lower lobe bronchiectasis and mucoid impaction. Biapical bullous changes in RIGHT pleural thickening. Tree-in-bud infiltrates bilateral upper lobes, to lesser extent RIGHT middle RIGHT lower lobe. Scattered punctate calcified granulomas. Mild centrilobular emphysema. No pleural effusion. UPPER ABDOMEN: Unchanged LEFT  hydro nephrosis and bilateral extrarenal pelvises. No adrenal mass. MUSCULOSKELETAL: Visualized soft tissues and included osseous structures are nonacute . Skin thickening coarse calcifications RIGHT breast are improved. Old RIGHT posterior rib fractures. Surgical clips RIGHT axilla consistent with nodal dissection. Moderate canal stenosis C6-7 due to large disc osteophyte complex. Review of the MIP images confirms the above findings. IMPRESSION: 1. No acute pulmonary embolism. 2. Scattered tree-in-bud infiltrates may be infectious or inflammatory, lymphangitic spread of tumor not excluded. Diffuse bronchial wall thickening. Low 3. Smaller RIGHT upper lobe carcinoma, now necrotic/cavitated. Decreased size of nodal metastasis. 4. Chronic LEFT hydronephrosis. Aortic Atherosclerosis (ICD10-I70.0) and Emphysema (ICD10-J43.9).  Electronically Signed   By: Elon Alas M.D.   On: 06/19/2017 01:46   Scheduled Meds: . aspirin  81 mg Oral Daily  . levothyroxine  100 mcg Oral QAC breakfast  . magnesium oxide  400 mg Oral BID  . metoprolol tartrate  50 mg Oral TID  . nicotine  14 mg Transdermal Daily  . pantoprazole  40 mg Oral Daily   Continuous Infusions: . 0.9 % NaCl with KCl 40 mEq / L 75 mL/hr (06/20/17 0530)  . aztreonam Stopped (06/20/17 0530)  . vancomycin Stopped (06/20/17 0230)    Principal Problem:   HCAP (healthcare-associated pneumonia) Active Problems:   TOBACCO ABUSE   GERD   Hypokalemia   Stage III squamous cell carcinoma of right lung ([facility])   COPD (chronic obstructive pulmonary disease) ([facility])   Hypothyroidism   Hypomagnesemia  Time spent:   Irwin Brakeman, MD, FAAFP [facility] Pager [phone]  If 7PM-7AM, please contact night-coverage [email or web address] Password [email or web address] 06/20/2017, 7:26 AM    LOS: 1 day

## 2017-06-21 LAB — HIV ANTIBODY (ROUTINE TESTING W REFLEX): HIV SCREEN 4TH GENERATION: NONREACTIVE

## 2017-06-21 MED ORDER — DOXYCYCLINE HYCLATE 100 MG PO CAPS: 100.0000 mg | ORAL_CAPSULE | Freq: Two times a day (BID) | ORAL | 0 refills | Status: AC

## 2017-06-21 NOTE — Discharge Summary (Signed)
Physician Discharge Summary  JAMERIA BRADWAY BTD:[id number] DOB: Dec 04, 1955 DOA: 06/18/2017  PCP: [name], MD  Admit date: 06/18/2017 Discharge date: 06/21/2017  Admitted From: Home  Disposition: Home   Recommendations for Outpatient Follow-up:  1. Follow up with PCP in 1 weeks 2. Please obtain BMP/CBC in one week 3. Please follow up on the following pending results: final culture results  Discharge Condition: STABLE   CODE STATUS: FULL    Brief Hospitalization Summary: Please see all hospital notes, images, labs for full details of the hospitalization.  HPI: VONNE MCDANEL is a 61 y.o. female with medical history significant of anxiety, history of renal failure, osteoarthritis, breast cancer, stage III lung cancer last chemotherapy in August 20/2018 and radiation therapy, COPD, active tobacco use, chronic back pain, fibromyalgia, depression with anxiety, GERD, hyperlipidemia, hypothyroidism, peripheral neuropathy, sleep apnea on CPAP who is coming to the emergency department with complaints of hemoptysis since yesterday evening at around 2215. Associated symptoms have been fatigued for the past few days, chills, sore throat, decreased appetite, headache for the past week and wheezing since yesterday.  ED Course: Initial vital signs temperature 98.61F, pulse 99, blood pressure 128/66 mmHg, respirations 18 and O2 sat 92% on nasal cannula oxygen. The patient received supplemental oxygen, bronchodilators, vancomycin and aztreonam.  Hemoglobin was 9.7 g/dL, WBC 7.8 with 84% neutrophils and platelets 184. Her CMP shows a potassium of 3.0 mmol/L, glucose of 125 and ALT of 7 U/L. magnesium was 1.5 mg/dL all other chemistry values were unremarkable.  Imaging: Chest radiograph showed right upper lobe cavitary lung mass with interval increase in opacity within the cavitary lesion in his periphery. There was adjacent groundglass opacity in the right upper lobe which may be  associated pneumonia.   CT angiogram of chest did not show acute pulmonary embolism. There were scattered tree-in-bud infiltrates may be infectious or inflammatory, lymphangitic spread of tumor not excluded. Diffuse bronchial wall thickening. Smaller RIGHT upper lobe carcinoma, now necrotic/cavitated.Decreased size of nodal metastasis. Chronic LEFT hydronephrosis. Please see images and full radiology report for further detail.  Brief Admission Hx: Jerrica Thorman Victoriais a 61 y.o.femalewith medical history significant of anxiety, history of renal failure, osteoarthritis, breast cancer, stage III lung cancer last chemotherapy in August 20/2018 and radiation therapy, COPD, active tobacco use, chronic back pain, fibromyalgia, depression with anxiety, GERD, hyperlipidemia, hypothyroidism, peripheral neuropathy, sleep apnea on CPAP who is coming to the emergency department with complaints of hemoptysis   MDM/Assessment & Plan:   1. HCAP - Pt says she is feeling a little better, no hemoptysis.  Coughing is less.  Pt feels better and wants to go home. Discharge on oral doxycycline 100 mg BID x 7 days.  2. Hemoptysis - resolved now per patient likely was related to pneumonia and cancer treatments.  Follow up with oncology.   3. Tobacco abuse - strongly counseled and encouraged patient to please stop using all tobacco products.  4. GERD -Protonix ordered for GI protection in hospital.  5. Hypokalemia-repleted. 6. Stage 3 SCC right lung-patient will follow up with oncology - plans to start care in [place].  7. COPD- stable. 8. Hypothyroidism-resume home medications. 9. Hypomagnesemia-repleted.  DVT prophylaxis:SCDs. Code Status:Full code. 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)    COPD (chronic obstructive pulmonary disease) (HCC)   Hypothyroidism  Hypomagnesemia  Discharge Instructions: Discharge Instructions    Call MD for:  difficulty breathing, headache or visual disturbances    Complete by:  As directed    Call MD for:  extreme fatigue    Complete by:  As directed    Call MD for:  persistant dizziness or light-headedness    Complete by:  As directed    Call MD for:  persistant nausea and vomiting    Complete by:  As directed    Call MD for:  severe uncontrolled pain    Complete by:  As directed    Increase activity slowly    Complete by:  As directed      Allergies as of 06/21/2017      Reactions   Penicillins Anaphylaxis, Other (See Comments)   Has patient had a PCN reaction causing immediate rash, facial/tongue/throat swelling, SOB or lightheadedness with hypotension:Yes Has patient had a PCN reaction causing severe rash involving mucus membranes or skin necrosis:Yes Has patient had a PCN reaction that required hospitalization:No Has patient had a PCN reaction occurring within the last 10 years:No If all of the above answers are "NO", then may proceed with Cephalosporin use.      Medication List    STOP taking these medications   olopatadine 0.1 % ophthalmic solution Commonly known as:  PATANOL     TAKE these medications   acetaminophen 500 MG tablet Commonly known as:  TYLENOL Take 500 mg by mouth every 6 (six) hours as needed for headache.   albuterol 108 (90 Base) MCG/ACT inhaler Commonly known as:  PROVENTIL HFA;VENTOLIN HFA Inhale 2 puffs into the lungs every 6 (six) hours as needed for wheezing or shortness of breath.   aspirin 81 MG chewable tablet Chew 81 mg by mouth daily.   budesonide-formoterol 160-4.5 MCG/ACT inhaler Commonly known as:  SYMBICORT Inhale 2 puffs into the lungs daily.   doxycycline 100 MG capsule Commonly known as:  VIBRAMYCIN Take 1 capsule (100 mg total) by mouth 2 (two) times daily.   levalbuterol  0.63 MG/3ML nebulizer solution Commonly known as:  XOPENEX Take 3 mLs (0.63 mg total) by nebulization 3 (three) times daily.   levothyroxine 100 MCG tablet Commonly known as:  SYNTHROID, LEVOTHROID Take 100 mcg by mouth daily before breakfast. Reported on 02/03/2016   metoprolol tartrate 50 MG tablet Commonly known as:  LOPRESSOR Take 1 tablet (50 mg total) by mouth 3 (three) times daily.   tiotropium 18 MCG inhalation capsule Commonly known as:  SPIRIVA Place 1 capsule (18 mcg total) into inhaler and inhale daily.      Follow-up Information    [name], MD. Schedule an appointment as soon as possible for a visit in 1 week(s).   Specialty:  Internal Medicine Contact information: [place] [address] [phone]          Allergies  Allergen Reactions  . Penicillins Anaphylaxis and Other (See Comments)    Has patient had a PCN reaction causing immediate rash, facial/tongue/throat swelling, SOB or lightheadedness with hypotension:Yes Has patient had a PCN reaction causing severe rash involving mucus membranes or skin necrosis:Yes Has patient had a PCN reaction that required hospitalization:No Has patient had a PCN reaction occurring within the last 10 years:No If all of the above answers are "NO", then may proceed with Cephalosporin use.    Current Discharge Medication List    START taking these medications   Details  doxycycline (VIBRAMYCIN) 100 MG capsule Take 1 capsule (  100 mg total) by mouth 2 (two) times daily. Qty: 14 capsule, Refills: 0      CONTINUE these medications which have NOT CHANGED   Details  acetaminophen (TYLENOL) 500 MG tablet Take 500 mg by mouth every 6 (six) hours as needed for headache.    albuterol (PROVENTIL HFA;VENTOLIN HFA) 108 (90 BASE) MCG/ACT inhaler Inhale 2 puffs into the lungs every 6 (six) hours as needed for wheezing or shortness of breath.    aspirin 81 MG chewable tablet Chew 81 mg by mouth daily.     budesonide-formoterol (SYMBICORT) 160-4.5 MCG/ACT inhaler Inhale 2 puffs into the lungs daily.     levalbuterol (XOPENEX) 0.63 MG/3ML nebulizer solution Take 3 mLs (0.63 mg total) by nebulization 3 (three) times daily. Qty: 3 mL, Refills: 12    levothyroxine (SYNTHROID, LEVOTHROID) 100 MCG tablet Take 100 mcg by mouth daily before breakfast. Reported on 02/03/2016    metoprolol tartrate (LOPRESSOR) 50 MG tablet Take 1 tablet (50 mg total) by mouth 3 (three) times daily. Qty: 90 tablet, Refills: 0    tiotropium (SPIRIVA) 18 MCG inhalation capsule Place 1 capsule (18 mcg total) into inhaler and inhale daily. Qty: 30 capsule, Refills: 6      STOP taking these medications     olopatadine (PATANOL) 0.1 % ophthalmic solution      dexlansoprazole (DEXILANT) 60 MG capsule      magnesium oxide (MAG-OX) 400 (241.3 Mg) MG tablet         Procedures/Studies: Dg Chest 2 View  Result Date: 06/19/2017 CLINICAL DATA:  History of lung cancer, coughing up blood EXAM: CHEST  2 VIEW COMPARISON:  06/02/2017, 11/2016, 12/08/2016 FINDINGS: Right upper lobe cavitary mass with increased opacity within the cavitary portion an at the periphery of the mass. Ground-glass opacity surrounding the mass in the right upper lobe. Possible tiny pleural effusions. Stable cardiomediastinal silhouette. No pneumothorax. IMPRESSION: 1. Right upper lobe cavitary lung mass re- demonstrated with interval increase in opacity within the cavitary lesion and at its periphery. Adjacent ground-glass opacity in the right upper lobe may reflect associated pneumonia. Possible small effusions. Electronically Signed   By: Donavan Foil M.D.   On: 06/19/2017 00:27   Dg Chest 2 View  Result Date: 05/26/2017 CLINICAL DATA:  Routine follow-up.  No complaints. EXAM: CHEST  2 VIEW COMPARISON:  05/18/2017. FINDINGS: Patchy airspace consolidation RIGHT upper lobe slightly improved. Still persistent opacity, recommended follow-up in 4-6 weeks.  Air cysts in the RIGHT lung apex are superimposed. The LEFT lung is clear. There is no effusion or pneumothorax. Unchanged and unremarkable cardiomediastinal silhouette. IMPRESSION: Slight improvement in airspace consolidation RIGHT upper lobe, but not yet clear. Continued surveillance warranted. Consider follow-up in 4-6 weeks. Electronically Signed   By: Staci Righter M.D.   On: 05/26/2017 14:30   Ct Head Wo Contrast  Result Date: 06/20/2017 CLINICAL DATA:  Headache for 2 weeks. History of lung and breast carcinoma EXAM: CT HEAD WITHOUT CONTRAST TECHNIQUE: Contiguous axial images were obtained from the base of the skull through the vertex without intravenous contrast. COMPARISON:  May 16, 2017 FINDINGS: Brain: The ventricles are normal in size and configuration for age. There is evidence of a prior infarct at the gray - white junction of the posterior right frontal lobe which is more apparent compared to prior study and appears to have undergone evolution since prior study. There is no demonstrable mass, hemorrhage, or extra-axial fluid collection. No midline shift. Elsewhere, gray-white compartments appear unremarkable. No acute infarct  evident. Vascular: No hyperdense vessel. There is calcification in carotid siphon regions, more on the right than on the left. Skull: There is a stable exostosis arising from right temporal bone. Bony calvarium appears intact. Sinuses/Orbits: Visualized paranasal sinuses are clear. Visualized orbits appear symmetric bilaterally. Other: Mastoid air cells are clear. There is debris in each external auditory canal. IMPRESSION: 1. 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 No acute infarct evident. 2. No intracranial mass hemorrhage, or extra-axial fluid collection. 3.  Stable right temporal bone exostosis. 4.  Foci of arterial vascular calcification. 5.  Probable cerumen in each external auditory canal. Electronically  Signed   By: Lowella Grip III M.D.   On: 06/20/2017 14:02   Ct Chest W Contrast  Result Date: 06/02/2017 CLINICAL DATA:  Stage III squamous cell carcinoma of the right lung. Status post chemo radiation EXAM: CT CHEST WITH CONTRAST TECHNIQUE: Multidetector CT imaging of the chest was performed during intravenous contrast administration. CONTRAST:  46mL ISOVUE-300 IOPAMIDOL (ISOVUE-300) INJECTION 61% COMPARISON:  PET-CT 02/11/2017 FINDINGS: Cardiovascular: The heart size is normal. No pericardial effusion. Coronary artery calcification is evident. Atherosclerotic calcification is noted in the wall of the thoracic aorta. Mediastinum/Nodes: 13 mm right paratracheal lymph node measured previously is now 7 mm short axis. Other scattered small mediastinal lymph nodes are evident. 10 mm short axis right hilar lymph node is identified. There is no left hilar lymphadenopathy. The esophagus has normal imaging features. There is no axillary lymphadenopathy. Lungs/Pleura: Hypermetabolic posterior right upper lobe pulmonary lesion seen on the previous PET-CT shows interval cavitation lesion measures 6.0 x 3.4 cm (image 45 series 5) compared to 5.5 x 4.6 cm previously. 14 mm subpleural nodule identified right lower lobe on image 66 series 5 is new in the interval. 9 mm right middle lobe subpleural nodule is seen anteriorly on image 61 series 5 is new. 1.8 cm sub solid nodule in the left upper lobe demonstrates centrilobular tree-in-bud opacity and may be an area of infectious/inflammatory bronchiolitis. Right lower lobe bronchiectasis with small airway impaction is similar to prior. No pulmonary edema or pleural effusion. Upper Abdomen: Left hydronephrosis is incompletely visualized. Musculoskeletal: Bone windows reveal no worrisome lytic or sclerotic osseous lesions. IMPRESSION: 1. Interval enlargement and cavitation of the right upper lobe pulmonary mass consistent with squamous cell carcinoma. 2. Interval development  of bilateral pulmonary nodules, 1 of which has features suggesting infectious/inflammatory etiology. Close continued attention recommended as metastatic disease not excluded. 3. Right paratracheal lymph node has decreased slightly in the interval. Right hilar lymph node more evident on today's study with intravenous contrast material. 4. Left hydronephrosis, incompletely visualized. 5. Coronary artery and Aortic Atherosclerois (ICD10-170.0) Electronically Signed   By: Misty Stanley M.D.   On: 06/02/2017 13:07   Ct Angio Chest Pe W And/or Wo Contrast  Result Date: 06/19/2017 CLINICAL DATA:  Hemoptysis.  History of lung and breast cancer. EXAM: CT ANGIOGRAPHY CHEST WITH CONTRAST TECHNIQUE: Multidetector CT imaging of the chest was performed using the standard protocol during bolus administration of intravenous contrast. Multiplanar CT image reconstructions and MIPs were obtained to evaluate the vascular anatomy. CONTRAST:  100 cc Isovue 370 COMPARISON:  Chest radiograph June 18, 2017 at 2358 hours and PET CT February 11, 2017 FINDINGS: CARDIOVASCULAR: Adequate contrast opacification of the pulmonary artery's. Main pulmonary artery is not enlarged. No pulmonary arterial filling defects to the level of the subsegmental branches. Heart size is normal, no right heart strain. No  pericardial effusion. Thoracic aorta is normal course and caliber, mild calcific atherosclerosis. MEDIASTINUM/NODES: Scattered calcified subcentimeter lymph nodes. Decreased size of RIGHT hilar and anterior mediastinal lymphadenopathy. LUNGS/PLEURA: Necrotic, cavitated spiculated 3.8 x 4.9 cm RIGHT upper lobe mass was 4.6 x 5.5 cm. Mass encases the RIGHT upper lobe pulmonary artery which remains patent. Narrowed RIGHT upper lobe bronchus. Diffuse bronchial wall thickening. Focal RIGHT lower lobe bronchiectasis and mucoid impaction. Biapical bullous changes in RIGHT pleural thickening. Tree-in-bud infiltrates bilateral upper lobes, to lesser  extent RIGHT middle RIGHT lower lobe. Scattered punctate calcified granulomas. Mild centrilobular emphysema. No pleural effusion. UPPER ABDOMEN: Unchanged LEFT hydro nephrosis and bilateral extrarenal pelvises. No adrenal mass. MUSCULOSKELETAL: Visualized soft tissues and included osseous structures are nonacute . Skin thickening coarse calcifications RIGHT breast are improved. Old RIGHT posterior rib fractures. Surgical clips RIGHT axilla consistent with nodal dissection. Moderate canal stenosis C6-7 due to large disc osteophyte complex. Review of the MIP images confirms the above findings. IMPRESSION: 1. No acute pulmonary embolism. 2. Scattered tree-in-bud infiltrates may be infectious or inflammatory, lymphangitic spread of tumor not excluded. Diffuse bronchial wall thickening. Low 3. Smaller RIGHT upper lobe carcinoma, now necrotic/cavitated. Decreased size of nodal metastasis. 4. Chronic LEFT hydronephrosis. Aortic Atherosclerosis (ICD10-I70.0) and Emphysema (ICD10-J43.9). Electronically Signed   By: Elon Alas M.D.   On: 06/19/2017 01:46      Subjective: Pt says she feels much better and wants to discharge home.   Discharge Exam: Vitals:   06/20/17 2206 06/21/17 0624  BP: 140/69 132/77  Pulse: 74 71  Resp: 16 16  Temp: 98.1 F (36.7 C) 98.4 F (36.9 C)  SpO2: 96% 95%   Vitals:   06/20/17 1245 06/20/17 1454 06/20/17 2206 06/21/17 0624  BP:  131/70 140/69 132/77  Pulse:  71 74 71  Resp:  18 16 16   Temp:  98.3 F (36.8 C) 98.1 F (36.7 C) 98.4 F (36.9 C)  TempSrc:  Oral Oral Oral  SpO2: 90% 97% 96% 95%  Weight:      Height:       General exam: Awake, alert, no distress, cooperative and pleasant Respiratory system: Rare expiratory wheeze heard. No increased work of breathing. Cardiovascular system: S1 & S2 heard, RRR.  Gastrointestinal system: Abdomen is nondistended, soft and nontender. Normal bowel sounds heard. Central nervous system: Alert and oriented. No focal  neurological deficits. Extremities: no clubbing or cyanosis.   The results of significant diagnostics from this hospitalization (including imaging, microbiology, ancillary and laboratory) are listed below for reference.     Microbiology: Recent Results (from the past 240 hour(s))  Culture, blood (routine x 2) Call MD if unable to obtain prior to antibiotics being given     Status: None (Preliminary result)   Collection Time: 06/19/17  2:25 AM  Result Value Ref Range Status   Specimen Description BLOOD RIGHT ARM  Final   Special Requests   Final    BOTTLES DRAWN AEROBIC AND ANAEROBIC Blood Culture adequate volume   Culture NO GROWTH 2 DAYS  Final   Report Status PENDING  Incomplete  Culture, blood (routine x 2) Call MD if unable to obtain prior to antibiotics being given     Status: None (Preliminary result)   Collection Time: 06/19/17  2:34 AM  Result Value Ref Range Status   Specimen Description BLOOD LEFT HAND  Final   Special Requests   Final    BOTTLES DRAWN AEROBIC AND ANAEROBIC Blood Culture adequate volume   Culture NO GROWTH  2 DAYS  Final   Report Status PENDING  Incomplete     Labs: BNP (last 3 results) No results for input(s): BNP in the last 8760 hours. Basic Metabolic Panel:  Recent Labs Lab 06/19/17 0018 06/19/17 0617 06/20/17 0608  NA 142 142 140  K 3.0* 2.8* 3.7  CL 101 102 104  CO2 29 30 28   GLUCOSE 125* 108* 105*  BUN 12 9 9   CREATININE 0.67 0.59 0.53  CALCIUM 9.1 8.9 8.8*  MG 1.5* 1.6* 2.0   Liver Function Tests:  Recent Labs Lab 06/19/17 0018  AST 15  ALT 7*  ALKPHOS 69  BILITOT 0.8  PROT 7.2  ALBUMIN 3.6   No results for input(s): LIPASE, AMYLASE in the last 168 hours. No results for input(s): AMMONIA in the last 168 hours. CBC:  Recent Labs Lab 06/19/17 0018 06/20/17 0608  WBC 7.8 5.9  NEUTROABS 6.5 4.6  HGB 9.7* 9.3*  HCT 28.7* 28.9*  MCV 100.7* 102.8*  PLT 184 187   Cardiac Enzymes: No results for input(s): CKTOTAL,  CKMB, CKMBINDEX, TROPONINI in the last 168 hours. BNP: Invalid input(s): POCBNP CBG: No results for input(s): GLUCAP in the last 168 hours. D-Dimer No results for input(s): DDIMER in the last 72 hours. Hgb A1c No results for input(s): HGBA1C in the last 72 hours. Lipid Profile No results for input(s): CHOL, HDL, LDLCALC, TRIG, CHOLHDL, LDLDIRECT in the last 72 hours. Thyroid function studies No results for input(s): TSH, T4TOTAL, T3FREE, THYROIDAB in the last 72 hours.  Invalid input(s): FREET3 Anemia work up No results for input(s): VITAMINB12, FOLATE, FERRITIN, TIBC, IRON, RETICCTPCT in the last 72 hours. Urinalysis    Component Value Date/Time   COLORURINE YELLOW 05/16/2017 1410   APPEARANCEUR CLEAR 05/16/2017 1410   LABSPEC 1.004 (L) 05/16/2017 1410   PHURINE 6.0 05/16/2017 1410   GLUCOSEU NEGATIVE 05/16/2017 1410   HGBUR NEGATIVE 05/16/2017 1410   HGBUR negative 02/26/2008 1027   BILIRUBINUR NEGATIVE 05/16/2017 1410   KETONESUR 5 (A) 05/16/2017 1410   PROTEINUR NEGATIVE 05/16/2017 1410   UROBILINOGEN 0.2 06/20/2012 1231   NITRITE NEGATIVE 05/16/2017 1410   LEUKOCYTESUR NEGATIVE 05/16/2017 1410   Sepsis Labs Invalid input(s): PROCALCITONIN,  WBC,  LACTICIDVEN Microbiology Recent Results (from the past 240 hour(s))  Culture, blood (routine x 2) Call MD if unable to obtain prior to antibiotics being given     Status: None (Preliminary result)   Collection Time: 06/19/17  2:25 AM  Result Value Ref Range Status   Specimen Description BLOOD RIGHT ARM  Final   Special Requests   Final    BOTTLES DRAWN AEROBIC AND ANAEROBIC Blood Culture adequate volume   Culture NO GROWTH 2 DAYS  Final   Report Status PENDING  Incomplete  Culture, blood (routine x 2) Call MD if unable to obtain prior to antibiotics being given     Status: None (Preliminary result)   Collection Time: 06/19/17  2:34 AM  Result Value Ref Range Status   Specimen Description BLOOD LEFT HAND  Final   Special  Requests   Final    BOTTLES DRAWN AEROBIC AND ANAEROBIC Blood Culture adequate volume   Culture NO GROWTH 2 DAYS  Final   Report Status PENDING  Incomplete   Time coordinating discharge: 32 mins  SIGNED:  Irwin Brakeman, MD  [facility] 06/21/2017, 9:03 AM Pager [phone]  If 7PM-7AM, please contact night-coverage [email or web address] Password [email or web address]

## 2017-06-21 NOTE — Discharge Instructions (Signed)
Follow with Primary MD  [name], MD  and other consultant's as instructed your Hospitalist MD  Please get a complete blood count and chemistry panel checked by your Primary MD at your next visit, and again as instructed by your Primary MD.  Get Medicines reviewed and adjusted: Please take all your medications with you for your next visit with your Primary MD  Laboratory/radiological data: Please request your Primary MD to go over all hospital tests and procedure/radiological results at the follow up, please ask your Primary MD to get all Hospital records sent to his/her office.  In some cases, they will be blood work, cultures and biopsy results pending at the time of your discharge. Please request that your primary care M.D. follows up on these results.  Also Note the following: If you experience worsening of your admission symptoms, develop shortness of breath, life threatening emergency, suicidal or homicidal thoughts you must seek medical attention immediately by calling 911 or calling your MD immediately  if symptoms less severe.  You must read complete instructions/literature along with all the possible adverse reactions/side effects for all the Medicines you take and that have been prescribed to you. Take any new Medicines after you have completely understood and accpet all the possible adverse reactions/side effects.   Do not drive when taking Pain medications or sleeping medications (Benzodaizepines)  Do not take more than prescribed Pain, Sleep and Anxiety Medications. It is not advisable to combine anxiety,sleep and pain medications without talking with your primary care practitioner  Special Instructions: If you have smoked or chewed Tobacco  in the last 2 yrs please stop smoking, stop any regular Alcohol  and or any Recreational drug use.  Wear Seat belts while driving.  Please note: You were cared for by a hospitalist during your hospital stay. Once you are discharged,  your primary care physician will handle any further medical issues. Please note that NO REFILLS for any discharge medications will be authorized once you are discharged, as it is imperative that you return to your primary care physician (or establish a relationship with a primary care physician if you do not have one) for your post hospital discharge needs so that they can reassess your need for medications and monitor your lab values.

## 2017-06-21 NOTE — Telephone Encounter (Signed)
lvm that medical records has been contacted to transfer care to [facility]. Next OV is 11/12 so there is time to work on this.

## 2017-06-21 NOTE — Progress Notes (Signed)
Sydney Blair discharged Home per MD order.  Discharge instructions reviewed and discussed with the patient, all questions and concerns answered. Copy of instructions and scripts given to patient.  Allergies as of 06/21/2017      Reactions   Penicillins Anaphylaxis, Other (See Comments)   Has patient had a PCN reaction causing immediate rash, facial/tongue/throat swelling, SOB or lightheadedness with hypotension:Yes Has patient had a PCN reaction causing severe rash involving mucus membranes or skin necrosis:Yes Has patient had a PCN reaction that required hospitalization:No Has patient had a PCN reaction occurring within the last 10 years:No If all of the above answers are "NO", then may proceed with Cephalosporin use.      Medication List    STOP taking these medications   olopatadine 0.1 % ophthalmic solution Commonly known as:  PATANOL     TAKE these medications   acetaminophen 500 MG tablet Commonly known as:  TYLENOL Take 500 mg by mouth every 6 (six) hours as needed for headache.   albuterol 108 (90 Base) MCG/ACT inhaler Commonly known as:  PROVENTIL HFA;VENTOLIN HFA Inhale 2 puffs into the lungs every 6 (six) hours as needed for wheezing or shortness of breath.   aspirin 81 MG chewable tablet Chew 81 mg by mouth daily.   budesonide-formoterol 160-4.5 MCG/ACT inhaler Commonly known as:  SYMBICORT Inhale 2 puffs into the lungs daily.   doxycycline 100 MG capsule Commonly known as:  VIBRAMYCIN Take 1 capsule (100 mg total) by mouth 2 (two) times daily.   levalbuterol 0.63 MG/3ML nebulizer solution Commonly known as:  XOPENEX Take 3 mLs (0.63 mg total) by nebulization 3 (three) times daily.   levothyroxine 100 MCG tablet Commonly known as:  SYNTHROID, LEVOTHROID Take 100 mcg by mouth daily before breakfast. Reported on 02/03/2016   metoprolol tartrate 50 MG tablet Commonly known as:  LOPRESSOR Take 1 tablet (50 mg total) by mouth 3 (three) times daily.    tiotropium 18 MCG inhalation capsule Commonly known as:  SPIRIVA Place 1 capsule (18 mcg total) into inhaler and inhale daily.       Patients skin is clean, dry and intact, no evidence of skin break down. IV site discontinued and catheter remains intact. Site without signs and symptoms of complications. Dressing and pressure applied.  Patient escorted to car in a wheelchair,  no distress noted upon discharge.  Sydney Blair 06/21/2017 11:18 AM

## 2017-06-23 ENCOUNTER — Ambulatory Visit: Payer: Medicaid Other | Admitting: Cardiology

## 2017-06-24 ENCOUNTER — Telehealth: Payer: Self-pay | Admitting: Internal Medicine

## 2017-06-24 LAB — CULTURE, BLOOD (ROUTINE X 2)
CULTURE: NO GROWTH
Culture: NO GROWTH
SPECIAL REQUESTS: ADEQUATE
SPECIAL REQUESTS: ADEQUATE

## 2017-06-24 NOTE — Telephone Encounter (Signed)
Pt appt with Dr. Talbert Cage is 06/28/17 @9 :00.  Pt is aware

## 2017-06-28 ENCOUNTER — Encounter (HOSPITAL_COMMUNITY): Payer: Medicaid Other | Attending: Oncology | Admitting: Oncology

## 2017-06-28 ENCOUNTER — Encounter (HOSPITAL_COMMUNITY): Payer: Medicaid Other

## 2017-06-28 ENCOUNTER — Encounter (HOSPITAL_COMMUNITY): Payer: Self-pay | Admitting: Oncology

## 2017-06-28 VITALS — BP 125/75 | HR 83 | Temp 97.6°F | Resp 18 | Wt 155.7 lb

## 2017-06-28 DIAGNOSIS — C3490 Malignant neoplasm of unspecified part of unspecified bronchus or lung: Secondary | ICD-10-CM

## 2017-06-28 DIAGNOSIS — Z825 Family history of asthma and other chronic lower respiratory diseases: Secondary | ICD-10-CM | POA: Insufficient documentation

## 2017-06-28 DIAGNOSIS — Z853 Personal history of malignant neoplasm of breast: Secondary | ICD-10-CM | POA: Insufficient documentation

## 2017-06-28 DIAGNOSIS — G629 Polyneuropathy, unspecified: Secondary | ICD-10-CM | POA: Insufficient documentation

## 2017-06-28 DIAGNOSIS — K219 Gastro-esophageal reflux disease without esophagitis: Secondary | ICD-10-CM | POA: Insufficient documentation

## 2017-06-28 DIAGNOSIS — R590 Localized enlarged lymph nodes: Secondary | ICD-10-CM | POA: Insufficient documentation

## 2017-06-28 DIAGNOSIS — Z9981 Dependence on supplemental oxygen: Secondary | ICD-10-CM | POA: Insufficient documentation

## 2017-06-28 DIAGNOSIS — Z79899 Other long term (current) drug therapy: Secondary | ICD-10-CM | POA: Insufficient documentation

## 2017-06-28 DIAGNOSIS — M797 Fibromyalgia: Secondary | ICD-10-CM | POA: Insufficient documentation

## 2017-06-28 DIAGNOSIS — F17210 Nicotine dependence, cigarettes, uncomplicated: Secondary | ICD-10-CM | POA: Insufficient documentation

## 2017-06-28 DIAGNOSIS — Z9889 Other specified postprocedural states: Secondary | ICD-10-CM | POA: Insufficient documentation

## 2017-06-28 DIAGNOSIS — E039 Hypothyroidism, unspecified: Secondary | ICD-10-CM | POA: Insufficient documentation

## 2017-06-28 DIAGNOSIS — Z720 Tobacco use: Secondary | ICD-10-CM | POA: Diagnosis not present

## 2017-06-28 DIAGNOSIS — C3411 Malignant neoplasm of upper lobe, right bronchus or lung: Secondary | ICD-10-CM | POA: Diagnosis not present

## 2017-06-28 DIAGNOSIS — Z7982 Long term (current) use of aspirin: Secondary | ICD-10-CM | POA: Insufficient documentation

## 2017-06-28 DIAGNOSIS — Z8249 Family history of ischemic heart disease and other diseases of the circulatory system: Secondary | ICD-10-CM | POA: Insufficient documentation

## 2017-06-28 DIAGNOSIS — G8929 Other chronic pain: Secondary | ICD-10-CM | POA: Insufficient documentation

## 2017-06-28 DIAGNOSIS — C3491 Malignant neoplasm of unspecified part of right bronchus or lung: Secondary | ICD-10-CM

## 2017-06-28 DIAGNOSIS — Z880 Allergy status to penicillin: Secondary | ICD-10-CM | POA: Insufficient documentation

## 2017-06-28 DIAGNOSIS — J440 Chronic obstructive pulmonary disease with acute lower respiratory infection: Secondary | ICD-10-CM | POA: Insufficient documentation

## 2017-06-28 DIAGNOSIS — Z809 Family history of malignant neoplasm, unspecified: Secondary | ICD-10-CM | POA: Insufficient documentation

## 2017-06-28 DIAGNOSIS — G4730 Sleep apnea, unspecified: Secondary | ICD-10-CM | POA: Insufficient documentation

## 2017-06-28 DIAGNOSIS — F418 Other specified anxiety disorders: Secondary | ICD-10-CM | POA: Insufficient documentation

## 2017-06-28 DIAGNOSIS — E7800 Pure hypercholesterolemia, unspecified: Secondary | ICD-10-CM | POA: Insufficient documentation

## 2017-06-28 MED ORDER — LIDOCAINE-PRILOCAINE 2.5-2.5 % EX CREA: TOPICAL_CREAM | CUTANEOUS | 2 refills | Status: DC

## 2017-06-28 MED ORDER — PROCHLORPERAZINE MALEATE 10 MG PO TABS: 10.0000 mg | ORAL_TABLET | Freq: Four times a day (QID) | ORAL | 2 refills | Status: DC | PRN

## 2017-06-28 MED ORDER — ONDANSETRON HCL 8 MG PO TABS: 8.0000 mg | ORAL_TABLET | Freq: Three times a day (TID) | ORAL | 2 refills | Status: DC | PRN

## 2017-06-28 NOTE — Patient Instructions (Signed)
[place] at [facility] Discharge Instructions  RECOMMENDATIONS MADE BY THE CONSULTANT AND ANY TEST RESULTS WILL BE SENT TO YOUR REFERRING PHYSICIAN.  You were seen today by Dr. Talbert Cage We will get your set up for port placement You will begin [place] on 11/20 Follow up in 1 month with our Nurse Practitioner  Thank you for choosing [place] at [facility] to provide your oncology and hematology care.  To afford each patient quality time with our provider, please arrive at least 15 minutes before your scheduled appointment time.    If you have a lab appointment with the [facility] please come in thru the  Main Entrance and check in at the main information desk  You need to re-schedule your appointment should you arrive 10 or more minutes late.  We strive to give you quality time with our providers, and arriving late affects you and other patients whose appointments are after yours.  Also, if you no show three or more times for appointments you may be dismissed from the clinic at the providers discretion.     Again, thank you for choosing [facility].  Our hope is that these requests will decrease the amount of time that you wait before being seen by our physicians.       _____________________________________________________________  Should you have questions after your visit to [facility], please contact our office at [phone] between the hours of 8:30 a.m. and 4:30 p.m.  Voicemails left after 4:30 p.m. will not be returned until the following business day.  For prescription refill requests, have your pharmacy contact our office.       Resources For Cancer Patients and their Caregivers ? American Cancer Society: Can assist with transportation, wigs, general needs, runs Look Good Feel Better.        (317)510-8959 ? Cancer Care: Provides financial assistance, online support groups, medication/co-pay  assistance.  1-800-813-HOPE (734)396-4806) ? [place] Assists [place] cancer patients and their families through emotional , educational and financial support.  503 412 3302 ? [place] DSS Where to apply for food stamps, Medicaid and utility assistance. 504-697-8307 ? [facility]: Transportation to medical appointments. 949-483-0375 ? Social Security Administration: May apply for disability if have a Stage IV cancer. (989) 602-4389 646 503 5303 ? [employer], Disability and Transit Services: Assists with nutrition, care and transit needs. [place] Support Programs: @10RELATIVEDAYS @ > Cancer Support Group  2nd Tuesday of the month 1pm-2pm, Journey Room  > Creative Journey  3rd Tuesday of the month 1130am-1pm, Journey Room  > Look Good Feel Better  1st Wednesday of the month 10am-12 noon, Journey Room (Call [place] to register [phone])

## 2017-06-28 NOTE — Progress Notes (Signed)
Chemo teaching completed.  Extensive teaching packet given.  Consent signed for imfinzi.

## 2017-06-28 NOTE — Patient Instructions (Signed)
[place]   CHEMOTHERAPY INSTRUCTIONS  You have Stage 3 non small cell lung Cancer.  We are going to treat you with Imfinzi.  This will be given every 2 weeks for the next year.  This infusion takes 1 hour to infuse.   You will see the doctor regularly throughout treatment.  We monitor your lab work prior to every treatment.  The doctor monitors your response to treatment by the way you are feeling, your blood work, and scans periodically.  There are wait times to be expected during treatment.  It will take about 30 minutes to 1 hour for lab work to result.  There will be a wait time for pharmacy to mix your medications.    POTENTIAL SIDE EFFECTS OF TREATMENT:  Durvalumab (Imfinzi)  About This Drug Durvalumab is used to treat cancer. This drug is given in the vein (IV).  Possible Side Effects . Tiredness . Muscle and bone pain . Constipation (not able to move bowels) . Decreased appetite (decreased hunger) . Nausea and throwing up (vomiting). . Swelling of your legs, ankles and/or feet . Urinary tract infection. Symptoms may include: . pain or burning when you pass urine . feeling like you have to pass urine often, but not much comes out when you do. . tender or heavy feeling in your lower abdomen . cloudy urine and/or urine that smells bad . pain on one side of your back under your ribs. This is where your kidneys are. . fever, chills, nausea and/or throwing up Note: Each of the side effects above was reported in 15% or greater of patients treated with durvalumab. Not all possible side effects are included above.  Warnings and Precautions . Inflammation (swelling) of the lungs. You may have a dry cough or trouble breathing. . Colitis which is swelling in the colon. The symptoms are loose bowel movements (diarrhea) stomach cramping, and sometimes blood in the bowel movements. . Changes in your liver function. . This drug may affect how your  kidneys work . This drug may affect some of your hormone glands (especially the thyroid, adrenals, pituitary and pancreas). Your doctor may check your hormone levels as needed. . Increased risk of infection. You may get a fever and chills. . While you are getting this drug in your vein (IV), you may have a reaction to the drug. Sometimes you may be given medication to stop or lessen these side effects. Your nurse will check you closely for these signs: fever or shaking chills, flushing, facial swelling, feeling dizzy, headache, trouble breathing, rash, itching, chest tightness, or chest pain. These reactions may happen for 24 hours after your infusion. If this happens, call 911 for emergency care.    Treating Side Effects . Manage tiredness by pacing your activities for the day. Be sure to include periods of rest between energy-draining activities . Keeping your pain under control is important to your well-being. Please tell your doctor or nurse if you are experiencing pain. . Ask your doctor or nurse about medicines that are available to help stop or lessen constipation. . If you are not able to move your bowels, check with your doctor or nurse before you use enemas, laxatives, or suppositories . Drink plenty of fluids (a minimum of eight glasses per day is recommended). . If you throw up or have loose bowel movements, you should drink more fluids so that you do not become dehydrated (lack water in the body from losing too  much fluid). . To help with nausea and vomiting, eat small, frequent meals instead of three large meals a day. Choose foods and drinks that are at room temperature. Ask your nurse or doctor about other helpful tips and medicine that is available to help or stop lessen these symptoms. . To help with decreased appetite, eat small, frequent meals . Eat high caloric food such as pudding, ice cream, yogurt and milkshakes. . Infusion reactions may happen for 24 hours after your  infusion. If this happens, call 911 for emergency care.   Food and Drug Interactions . There are no known interactions of durvalumab with food. This drug may interact with other medicines. Tell your doctor and pharmacist about all the medicines and dietary supplements (vitamins, minerals, herbs and others) that you are taking at this time. The safety and use of dietary supplements and alternative diets are often not known. Using these might affect your cancer or interfere with your treatment. Until more is known, you should not use dietary supplements or alternative diets without your cancer doctor's help.  When to Call the Doctor Call your doctor or nurse if you have any of these symptoms and/or any new or unusual symptoms: . Fever of 100.5 F (38 C) or higher . Chills, flushing . Wheezing, chest pain or trouble breathing . Facial swelling . Rash, itching or skin blistering . Rash that is not relieved by prescribed medicines . Pain when passing urine . Feeling dizzy or lightheaded . Confusion . Nausea that stops you from eating or drinking or is not relieved by prescribed medicine . No bowel movement for 3 days or you feel uncomfortable . Severe pain in the stomach area . Pain or burning when you pass urine. . Difficulty urinating . Feeling like you have to pass urine often, but not much comes out when you do. . Tender or heavy feeling in your lower abdomen. . Cloudy urine and/or urine that smells bad. . Pain on one side of your back under your ribs. This is where your kidneys are. . Decreased urine . Unusual thirst or passing urine often . Fatigue that interferes with your daily activities . Signs of liver problems: dark urine, pale bowel movements, bad stomach pain, feeling very tired and weak, unusual itching, or yellowing of the eyes or skin . Signs of infusion reaction: fever or shaking chills, flushing, facial swelling, feeling dizzy, headache, trouble breathing, rash, itching,  chest tightness, or chest pain. . If you think you are pregnant  Reproduction Warnings . Pregnancy warning: This drug can have harmful effects on the unborn baby. It is recommended that effective methods of birth control should be used by women of child-bearing age during cancer treatment and for at least 3 months after treatment. . Breast feeding warning: Women should not breast feed during treatment and for 3 month after treatment because this drug could enter the breast milk and cause harm to a breast feeding baby.                       SELF CARE ACTIVITIES WHILE ON CHEMOTHERAPY:  Hydration Increase your fluid intake 48 hours prior to treatment and drink at least 8 to 12 cups (64 ounces) of water/decaff beverages per day after treatment. You can still have your cup of coffee or soda but these beverages do not count as part of your 8 to 12 cups that you need to drink daily. No alcohol intake.  Medications Continue taking your  normal prescription medication as prescribed.  If you start any new herbal or new supplements please let us know first to make sure it is safe.  Mouth Care Have teeth cleaned professionally before starting treatment. Keep dentures and partial plates clean. Use soft toothbrush and do not use mouthwashes that contain alcohol. Biotene is a good mouthwash that is available at most pharmacies or may be ordered by calling [phone]. Use warm salt water gargles (1 teaspoon salt per 1 quart warm water) before and after meals and at bedtime. Or you may rinse with 2 tablespoons of three-percent hydrogen peroxide mixed in eight ounces of water. If you are still having problems with your mouth or sores in your mouth please call the clinic. If you need dental work, please let the doctor know before you go for your appointment so that we can coordinate the best possible time for you in regards to your chemo regimen. You need to also let your dentist know that you  are actively taking chemo. We may need to do labs prior to your dental appointment.   Skin Care Always use sunscreen that has not expired and with SPF (Sun Protection Factor) of 50 or higher. Wear hats to protect your head from the sun. Remember to use sunscreen on your hands, ears, face, & feet.  Use good moisturizing lotions such as udder cream, eucerin, or even Vaseline. Some chemotherapies can cause dry skin, color changes in your skin and nails.    . Avoid long, hot showers or baths. . Use gentle, fragrance-free soaps and laundry detergent. . Use moisturizers, preferably creams or ointments rather than lotions because the thicker consistency is better at preventing skin dehydration. Apply the cream or ointment within 15 minutes of showering. Reapply moisturizer at night, and moisturize your hands every time after you wash them.   Hair Loss (if your doctor says your hair will fall out)  . If your doctor says that your hair is likely to fall out, decide before you begin chemo whether you want to wear a wig. You may want to shop before treatment to match your hair color. . Hats, turbans, and scarves can also camouflage hair loss, although some people prefer to leave their heads uncovered. If you go bare-headed outdoors, be sure to use sunscreen on your scalp. . Cut your hair short. It eases the inconvenience of shedding lots of hair, but it also can reduce the emotional impact of watching your hair fall out. . Don't perm or color your hair during chemotherapy. Those chemical treatments are already damaging to hair and can enhance hair loss. Once your chemo treatments are done and your hair has grown back, it's OK to resume dyeing or perming hair. With chemotherapy, hair loss is almost always temporary. But when it grows back, it may be a different color or texture. In older adults who still had hair color before chemotherapy, the new growth may be completely gray.  Often, new hair is very fine and  soft.  Infection Prevention Please wash your hands for at least 30 seconds using warm soapy water. Handwashing is the #1 way to prevent the spread of germs. Stay away from sick people or people who are getting over a cold. If you develop respiratory systems such as green/yellow mucus production or productive cough or persistent cough let us know and we will see if you need an antibiotic. It is a good idea to keep a pair of gloves on when going into grocery  stores/[employer] to decrease your risk of coming into contact with germs on the carts, etc. Carry alcohol hand gel with you at all times and use it frequently if out in public. If your temperature reaches 100.5 or higher please call the clinic and let us know.  If it is after hours or on the weekend please go to the ER if your temperature is over 100.5.  Please have your own personal thermometer at home to use.    Sex and bodily fluids If you are going to have sex, a condom must be used to protect the person that isn't taking chemotherapy. Chemo can decrease your libido (sex drive). For a few days after chemotherapy, chemotherapy can be excreted through your bodily fluids.  When using the toilet please close the lid and flush the toilet twice.  Do this for a few day after you have had chemotherapy.    Effects of chemotherapy on your sex life Some changes are simple and won't last long. They won't affect your sex life permanently. Sometimes you may feel: . too tired . not strong enough to be very active . sick or sore  . not in the mood . anxious or low Your anxiety might not seem related to sex. For example, you may be worried about the cancer and how your treatment is going. Or you may be worried about money, or about how you family are coping with your illness. These things can cause stress, which can affect your interest in sex. It's important to talk to your partner about how you feel. Remember - the changes to your sex life don't usually last  long. There's usually no medical reason to stop having sex during chemo. The drugs won't have any long term physical effects on your performance or enjoyment of sex. Cancer can't be passed on to your partner during sex   Contraception It's important to use reliable contraception during treatment. Avoid getting pregnant while you or your partner are having chemotherapy. This is because the drugs may harm the baby. Sometimes chemotherapy drugs can leave a man or woman infertile.  This means you would not be able to have children in the future. You might want to talk to someone about permanent infertility. It can be very difficult to learn that you may no longer be able to have children. Some people find counselling helpful. There might be ways to preserve your fertility, although this is easier for men than for women. You may want to speak to a fertility expert. You can talk about sperm banking or harvesting your eggs. You can also ask about other fertility options, such as donor eggs. If you have or have had breast cancer, your doctor might advise you not to take the contraceptive pill. This is because the hormones in it might affect the cancer.  It is not known for sure whether or not chemotherapy drugs can be passed on through semen or secretions from the vagina. Because of this some doctors advise people to use a barrier method if you have sex during treatment. This applies to vaginal, anal or oral sex. Generally, doctors advise a barrier method only for the time you are actually having the treatment and for about a week after your treatment. Advice like this can be worrying, but this does not mean that you have to avoid being intimate with your partner. You can still have close contact with your partner and continue to enjoy sex.  Animals If you have cats or  birds we just ask that you not change the litter or change the cage.  Please have someone else do this for you while you are on chemotherapy.     Food Safety During and After Cancer Treatment Food safety is important for people both during and after cancer treatment. Cancer and cancer treatments, such as chemotherapy, radiation therapy, and stem cell/bone marrow transplantation, often weaken the immune system. This makes it harder for your body to protect itself from foodborne illness, also called food poisoning. Foodborne illness is caused by eating food that contains harmful bacteria, parasites, or viruses.  Foods to avoid Some foods have a higher risk of becoming tainted with bacteria. These include: Marland Kitchen Unwashed fresh fruit and vegetables, especially leafy vegetables that can hide dirt and other contaminants . Raw sprouts, such as alfalfa sprouts . Raw or undercooked beef, especially ground beef, or other raw or undercooked meat and poultry . Fatty, fried, or spicy foods immediately before or after treatment.  These can sit heavy on your stomach and make you feel nauseous. . Raw or undercooked shellfish, such as oysters. . Sushi and sashimi, which often contain raw fish.  . Unpasteurized beverages, such as unpasteurized fruit juices, raw milk, raw yogurt, or cider . Undercooked eggs, such as soft boiled, over easy, and poached; raw, unpasteurized eggs; or foods made with raw egg, such as homemade raw cookie dough and homemade mayonnaise Simple steps for food safety Shop smart. . Do not buy food stored or displayed in an unclean area. . Do not buy bruised or damaged fruits or vegetables. . Do not buy cans that have cracks, dents, or bulges. . Pick up foods that can spoil at the end of your shopping trip and store them in a cooler on the way home. Prepare and clean up foods carefully. . Rinse all fresh fruits and vegetables under running water, and dry them with a clean towel or paper towel. . Clean the top of cans before opening them. . After preparing food, wash your hands for 20 seconds with hot water and soap. Pay special  attention to areas between fingers and under nails. . Clean your utensils and dishes with hot water and soap. Marland Kitchen Disinfect your kitchen and cutting boards using 1 teaspoon of liquid, unscented bleach mixed into 1 quart of water.   Dispose of old food. . Eat canned and packaged food before its expiration date (the "use by" or "best before" date). . Consume refrigerated leftovers within 3 to 4 days. After that time, throw out the food. Even if the food does not smell or look spoiled, it still may be unsafe. Some bacteria, such as Listeria, can grow even on foods stored in the refrigerator if they are kept for too long.   Take precautions when eating out. . At restaurants, avoid buffets and salad bars where food sits out for a long time and comes in contact with many people. Food can become contaminated when someone with a virus, often a norovirus, or another "bug" handles it. . Put any leftover food in a "to-go" container yourself, rather than having the server do it. And, refrigerate leftovers as soon as you get home. . Choose restaurants that are clean and that are willing to prepare your food as you order it cooked.                          MEDICATIONS:  Zofran/Ondansetron 8mg  tablet. Take 1 tablet every 8  hours as needed for nausea/vomiting. (#1 nausea med to take, this can constipate)  Compazine/Prochlorperazine 10mg  tablet. Take 1 tablet every 6 hours as needed for nausea/vomiting. (#2 nausea med to take, this can make you sleepy)   EMLA cream. Apply a quarter size amount to port site 1 hour prior to chemo. Do not rub in. Cover with plastic wrap.   Over-the-Counter Meds:  Miralax 1 capful in 8 oz of fluid daily. May increase to two times a day if needed. This is a stool softener. If this doesn't work proceed you can add:  Senokot S-start with 1 tablet two times a day and increase to 4 tablets two times a day if needed. (total of 8 tablets in a 24 hour period).  This is a stimulant laxative.   Call us if this does not help your bowels move.   Imodium 2mg  capsule. Take 2 capsules after the 1st loose stool and then 1 capsule every 2 hours until you go a total of 12 hours without having a loose stool. Call the [facility] if loose stools continue. If diarrhea occurs @ bedtime, take 2 capsules @ bedtime. Then take 2 capsules every 4 hours until morning. Call [facility].     Diarrhea Sheet  If you are having loose stools/diarrhea, please purchase Imodium and begin taking as outlined:  At the first sign of poorly formed or loose stools you should begin taking Imodium(loperamide) 2 mg capsules.  Take two caplets (4mg ) followed by one caplet (2mg ) every 2 hours until you have had no diarrhea for 12 hours.  During the night take two caplets (4mg ) at bedtime and continue every 4 hours during the night until the morning.  Stop taking Imodium only after there is no sign of diarrhea for 12 hours.    Always call the [facility] if you are having loose stools/diarrhea that you can't get under control.  Loose stools/disrrhea leads to dehydration (loss of water) in your body.  We have other options of trying to get the loose stools/diarrhea to stopped but you must let us know!    Constipation Sheet *Miralax in 8 oz of fluid daily.  May increase to two times a day if needed.  This is a stool softener.  If this not enough to keep your bowel regular:  You can add:  *Senokot S, start with one tablet twice a day and can increase to 4 tablets twice a day if needed.  This is a stimulant laxative.   Sometimes when you take pain medication you need BOTH a medicine to keep your stool soft and a medicine to help your bowel push it out!  Please call if the above does not work for you.   Do not go more than 2 days without a bowel movement.  It is very important that you do not become constipated.  It will make you feel sick to your stomach (nausea) and can cause  abdominal pain and vomiting.    Nausea Sheet  Zofran/Ondansetron 8mg  tablet. Take 1 tablet every 8 hours as needed for nausea/vomiting. (#1 nausea med to take, this can constipate)  Compazine/Prochlorperazine 10mg  tablet. Take 1 tablet every 6 hours as needed for nausea/vomiting. (#2 nausea med to take, this can make you sleepy)  You can take these medications together or separately.  We would first like for you to try the Ondansetron by itself and then take the Prochloperizine if needed. But you are allowed to take both medications at the  same time if your nausea is that severe.  If you are having persistent nausea (nausea that does not stop) please take these medications on a staggered schedule so that the nausea medication stays in your body.  Please call the [facility] and let us know the amount of nausea that you are experiencing.  If you begin to vomit, you need to call the [facility] and if it is the weekend and you have vomited more than one time and cant get it to stop-go to the Emergency Room.  Persistent nausea/vomiting can lead to dehydration (loss of fluid in your body) and will make you feel terrible.   Ice chips, sips of clear liquids, foods that are @ room temperature, crackers, and toast tend to be better tolerated.    SYMPTOMS TO REPORT AS SOON AS POSSIBLE AFTER TREATMENT:  FEVER GREATER THAN 100.5 F  CHILLS WITH OR WITHOUT FEVER  NAUSEA AND VOMITING THAT IS NOT CONTROLLED WITH YOUR NAUSEA MEDICATION  UNUSUAL SHORTNESS OF BREATH  UNUSUAL BRUISING OR BLEEDING  TENDERNESS IN MOUTH AND THROAT WITH OR WITHOUT PRESENCE OF ULCERS  URINARY PROBLEMS  BOWEL PROBLEMS  UNUSUAL RASH    Wear comfortable clothing and clothing appropriate for easy access to any Portacath or PICC line. Let us know if there is anything that we can do to make your therapy better!    What to do if you need assistance after hours or on the weekends: CALL [phone].  HOLD on the line,  do not hang up.  You will hear multiple messages but at the end you will be connected with a nurse triage line.  They will contact the doctor if necessary.  Most of the time they will be able to assist you.  Do not call the hospital operator.     I have been informed and understand all of the instructions given to me and have received a copy. I have been instructed to call the clinic [phone] or my family physician as soon as possible for continued medical care, if indicated. I do not have any more questions at this time but understand that I may call the [facility] or the Patient Navigator at [phone] during office hours should I have questions or need assistance in obtaining follow-up care.

## 2017-06-28 NOTE — Progress Notes (Signed)
[facility] Initial Visit:  Patient Care Team: [name], MD as PCP - General (Internal Medicine) Gala Romney, Cristopher Estimable, MD (Gastroenterology)  CHIEF COMPLAINTS/PURPOSE OF CONSULTATION:  Oncology History   Patient presented with incidental finding on pre-op CXR for a breast biopsy.     Lung mass   12/08/2016 Imaging    CXR IMPRESSION: 5 cm masslike opacity within the right upper lobe -chest CT with contrast is recommended for further evaluation.      02/01/2017 Initial Diagnosis    Lung mass     02/11/2017 Imaging    PET IMPRESSION: 1. Hypermetabolic RIGHT upper lobe mass consists with bronchogenic carcinoma. 2. Hypermetabolic RIGHT hilar and ipsilateral RIGHT paratracheal metastatic adenopathy. 3. No contralateral nodal metastasis or supraclavicular nodal metastasis.      02/28/2017 Surgery    Operation: Flexible video fiberoptic bronchoscopy with endobronchial ultrasound and biopsies.      02/28/2017 Pathology Results    Cytology Diagnosis BRONCHIAL BRUSHING SPECIMEN D, RIGHT UPPER LOBE (SPECIMEN 4 OF 4 COLLECTED 02/28/2017) MALIGNANT CELLS CONSISTENT WITH SQUAMOUS CELL CARCINOMA.       DIAGNOSIS: Stage IIIA (T2b, N2, M0) non-small cell lung cancer, squamous cell carcinoma presented with large right upper lobe lung mass in addition to right hilar and mediastinal lymphadenopathy diagnosed in July 2018. PDL 1 expression is negative.  PRIOR THERAPY: concurrent chemoradiation with weekly carboplatin for AUC of 2 and paclitaxel 45 MG/M2 status post 6 cycles, last dose was given 05/02/2017.  CURRENT THERAPY: consolidation immunotherapy with Imfinzi (Durvalumab) 10 MG/KG every 2 weeks, first dose planned for 07/12/17  HISTORY OF PRESENTING ILLNESS: Sydney Blair 61 y.o. female is here to establish care for her stage IIIA NSCLC and history of breast cancer. Patient recently completed definitive treatment with concurrent chemo/RT on 05/02/17. She was  previously under the car of Dr. Julien Nordmann at [facility] but has decided to transfer her care here since its close to her home. She was recently hospitalized from 10/28-10/31/18 for coughing up blood and diagnosed with suspected RUL pneumonia. She was discharged with doxycycline. She has completed her antibiotic treatment. She stated that she is no longer coughing up blood and her breathing is back at her baseline. She continues to smoke 8 cigarettes a day. She is chronically oxygen dependent. Today she states that she feels well. She denies any chest pain, worsening shortness of breath,abdominal pain, nausea, vomiting, diarrhea, focal weakness, fevers/chills.  Review of Systems - Oncology ROS as per HPI otherwise 12 point ROS is negative.  MEDICAL HISTORY: Past Medical History:  Diagnosis Date  . Anxiety   . ARF (acute renal failure) ([place]) 08/07/2011   ?   Marland Kitchen Arthritis   . Cancer ([place]) 2005   breast-right  . Chronic back pain   . COPD (chronic obstructive pulmonary disease) ([place])   . Depression   . Depression with anxiety   . Dyspnea    on exertion  . Fibromyalgia   . GERD (gastroesophageal reflux disease)   . History of radiation therapy 03/24/17-05/12/17   right lun 2 Gy in 30 fractions  . Hypercholesteremia   . Hypothyroidism   . Hypothyroidism 06/19/2017  . Incontinence   . Neuropathy   . Peripheral neuropathy   . Sciatic pain   . Sebaceous cyst    lt labial location  . Sleep apnea    uses CPAP  . Stage III squamous cell carcinoma of right lung ([place]) 03/10/2017  . Tobacco abuse     SURGICAL  HISTORY: Past Surgical History:  Procedure Laterality Date  . bladder tack    . BREAST LUMPECTOMY     right with node dissection  . COLONOSCOPY  11/11/2010   [id number] rectal polyp and splenic flexure otherwise normal; pathology showed  tubular adenomas. Next TCS 10/2015.  Marland Kitchen ESOPHAGOGASTRODUODENOSCOPY  11/11/2010   JQB:HALPF hiatal hernia, 34 French dilator    SOCIAL  HISTORY: Social History   Socioeconomic History  . Marital status: Married    Spouse name: Not on file  . Number of children: 1  . Years of education: Not on file  . Highest education level: Not on file  Social Needs  . Financial resource strain: Not on file  . Food insecurity - worry: Not on file  . Food insecurity - inability: Not on file  . Transportation needs - medical: Not on file  . Transportation needs - non-medical: Not on file  Occupational History  . Occupation: disabled    Fish farm manager: UNEMPLOYED  Tobacco Use  . Smoking status: Current Every Day Smoker    Packs/day: 1.00    Years: 35.00    Pack years: 35.00    Types: Cigarettes  . Smokeless tobacco: Never Used  . Tobacco comment: has cut back to one pack daily  Substance and Sexual Activity  . Alcohol use: No    Alcohol/week: 0.0 oz  . Drug use: No  . Sexual activity: Yes    Birth control/protection: Post-menopausal    Comment: chemo stopped her periods  Other Topics Concern  . Not on file  Social History Narrative   Lives w. Husband   Lost 1 son-?etiology    FAMILY HISTORY Family History  Problem Relation Age of Onset  . Cancer Other   . Heart attack Other   . Lung disease Other   . Emphysema Mother   . Autoimmune disease Neg Hx     ALLERGIES:  is allergic to penicillins.  MEDICATIONS:  Current Outpatient Medications  Medication Sig Dispense Refill  . acetaminophen (TYLENOL) 500 MG tablet Take 500 mg by mouth every 6 (six) hours as needed for headache.    . albuterol (PROVENTIL HFA;VENTOLIN HFA) 108 (90 BASE) MCG/ACT inhaler Inhale 2 puffs into the lungs every 6 (six) hours as needed for wheezing or shortness of breath.    Marland Kitchen aspirin 81 MG chewable tablet Chew 81 mg by mouth daily.    . budesonide-formoterol (SYMBICORT) 160-4.5 MCG/ACT inhaler Inhale 2 puffs into the lungs daily.     Marland Kitchen doxycycline (VIBRAMYCIN) 100 MG capsule Take 1 capsule (100 mg total) by mouth 2 (two) times daily. 14 capsule 0   . levalbuterol (XOPENEX) 0.63 MG/3ML nebulizer solution Take 3 mLs (0.63 mg total) by nebulization 3 (three) times daily. 3 mL 12  . levothyroxine (SYNTHROID, LEVOTHROID) 100 MCG tablet Take 100 mcg by mouth daily before breakfast. Reported on 02/03/2016    . metoprolol tartrate (LOPRESSOR) 50 MG tablet Take 1 tablet (50 mg total) by mouth 3 (three) times daily. 90 tablet 0  . tiotropium (SPIRIVA) 18 MCG inhalation capsule Place 1 capsule (18 mcg total) into inhaler and inhale daily. 30 capsule 6   No current facility-administered medications for this visit.     PHYSICAL EXAMINATION:  ECOG PERFORMANCE STATUS: 2 - Symptomatic, <50% confined to bed   BP 125/75, P 83, RR 18 T97.6 O2 sat 98%  Physical Exam Constitutional: Well-developed, well-nourished, and in no distress.  Chronically oxygen dependent on O2 via [place]. HENT:  Head: Normocephalic and  atraumatic.  Mouth/Throat: No oropharyngeal exudate. Mucosa moist. Eyes: Pupils are equal, round, and reactive to light. Conjunctivae are normal. No scleral icterus.  Neck: Normal range of motion. Neck supple. No JVD present.  Cardiovascular: Normal rate, regular rhythm and normal heart sounds.  Exam reveals no gallop and no friction rub.   No murmur heard. Pulmonary/Chest: Effort normal and breath sounds normal. No respiratory distress. No wheezes.No rales.  Abdominal: Soft. Bowel sounds are normal. No distension. There is no tenderness. There is no guarding.  Musculoskeletal: No edema or tenderness.  Lymphadenopathy:    No cervical or supraclavicular adenopathy.  Neurological: Alert and oriented to person, place, and time. No cranial nerve deficit.  Skin: Skin is warm and dry. No rash noted. No erythema. No pallor.  Psychiatric: Affect and judgment normal.    LABORATORY DATA: I have personally reviewed the data as listed:  Admission on 06/18/2017, Discharged on 06/21/2017  Component Date Value Ref Range Status  . Sodium 06/19/2017 142   135 - 145 mmol/L Final  . Potassium 06/19/2017 3.0* 3.5 - 5.1 mmol/L Final  . Chloride 06/19/2017 101  101 - 111 mmol/L Final  . CO2 06/19/2017 29  22 - 32 mmol/L Final  . Glucose, Bld 06/19/2017 125* 65 - 99 mg/dL Final  . BUN 06/19/2017 12  6 - 20 mg/dL Final  . Creatinine, Ser 06/19/2017 0.67  0.44 - 1.00 mg/dL Final  . Calcium 06/19/2017 9.1  8.9 - 10.3 mg/dL Final  . Total Protein 06/19/2017 7.2  6.5 - 8.1 g/dL Final  . Albumin 06/19/2017 3.6  3.5 - 5.0 g/dL Final  . AST 06/19/2017 15  15 - 41 U/L Final  . ALT 06/19/2017 7* 14 - 54 U/L Final  . Alkaline Phosphatase 06/19/2017 69  38 - 126 U/L Final  . Total Bilirubin 06/19/2017 0.8  0.3 - 1.2 mg/dL Final  . GFR calc non Af Amer 06/19/2017 >60  >60 mL/min Final  . GFR calc Af Amer 06/19/2017 >60  >60 mL/min Final   Comment: (NOTE) The eGFR has been calculated using the CKD EPI equation. This calculation has not been validated in all clinical situations. eGFR's persistently <60 mL/min signify possible Chronic Kidney Disease.   . Anion gap 06/19/2017 12  5 - 15 Final  . WBC 06/19/2017 7.8  4.0 - 10.5 K/uL Final  . RBC 06/19/2017 2.85* 3.87 - 5.11 MIL/uL Final  . Hemoglobin 06/19/2017 9.7* 12.0 - 15.0 g/dL Final  . HCT 06/19/2017 28.7* 36.0 - 46.0 % Final  . MCV 06/19/2017 100.7* 78.0 - 100.0 fL Final  . MCH 06/19/2017 34.0  26.0 - 34.0 pg Final  . MCHC 06/19/2017 33.8  30.0 - 36.0 g/dL Final  . RDW 06/19/2017 16.2* 11.5 - 15.5 % Final  . Platelets 06/19/2017 184  150 - 400 K/uL Final  . Neutrophils Relative % 06/19/2017 84  % Final  . Neutro Abs 06/19/2017 6.5  1.7 - 7.7 K/uL Final  . Lymphocytes Relative 06/19/2017 9  % Final  . Lymphs Abs 06/19/2017 0.7  0.7 - 4.0 K/uL Final  . Monocytes Relative 06/19/2017 7  % Final  . Monocytes Absolute 06/19/2017 0.5  0.1 - 1.0 K/uL Final  . Eosinophils Relative 06/19/2017 0  % Final  . Eosinophils Absolute 06/19/2017 0.0  0.0 - 0.7 K/uL Final  . Basophils Relative 06/19/2017 0  %  Final  . Basophils Absolute 06/19/2017 0.0  0.0 - 0.1 K/uL Final  . Magnesium 06/19/2017 1.5* 1.7 -  2.4 mg/dL Final  . Specimen Description 06/19/2017 BLOOD RIGHT ARM   Final  . Special Requests 06/19/2017 BOTTLES DRAWN AEROBIC AND ANAEROBIC Blood Culture adequate volume   Final  . Culture 06/19/2017 NO GROWTH 5 DAYS   Final  . Report Status 06/19/2017 06/24/2017 FINAL   Final  . Specimen Description 06/19/2017 BLOOD LEFT HAND   Final  . Special Requests 06/19/2017 BOTTLES DRAWN AEROBIC AND ANAEROBIC Blood Culture adequate volume   Final  . Culture 06/19/2017 NO GROWTH 5 DAYS   Final  . Report Status 06/19/2017 06/24/2017 FINAL   Final  . Sodium 06/19/2017 142  135 - 145 mmol/L Final  . Potassium 06/19/2017 2.8* 3.5 - 5.1 mmol/L Final  . Chloride 06/19/2017 102  101 - 111 mmol/L Final  . CO2 06/19/2017 30  22 - 32 mmol/L Final  . Glucose, Bld 06/19/2017 108* 65 - 99 mg/dL Final  . BUN 06/19/2017 9  6 - 20 mg/dL Final  . Creatinine, Ser 06/19/2017 0.59  0.44 - 1.00 mg/dL Final  . Calcium 06/19/2017 8.9  8.9 - 10.3 mg/dL Final  . GFR calc non Af Amer 06/19/2017 >60  >60 mL/min Final  . GFR calc Af Amer 06/19/2017 >60  >60 mL/min Final   Comment: (NOTE) The eGFR has been calculated using the CKD EPI equation. This calculation has not been validated in all clinical situations. eGFR's persistently <60 mL/min signify possible Chronic Kidney Disease.   . Anion gap 06/19/2017 10  5 - 15 Final  . Magnesium 06/19/2017 1.6* 1.7 - 2.4 mg/dL Final  . HIV Screen 4th Generation wRfx 06/20/2017 Non Reactive  Non Reactive Final   Comment: (NOTE) Performed At: [facility] [place], [place] [id number] Lindon Romp MD DX:[id number]   . WBC 06/20/2017 5.9  4.0 - 10.5 K/uL Final  . RBC 06/20/2017 2.81* 3.87 - 5.11 MIL/uL Final  . Hemoglobin 06/20/2017 9.3* 12.0 - 15.0 g/dL Final  . HCT 06/20/2017 28.9* 36.0 - 46.0 % Final  . MCV 06/20/2017 102.8* 78.0 - 100.0 fL Final  .  MCH 06/20/2017 33.1  26.0 - 34.0 pg Final  . MCHC 06/20/2017 32.2  30.0 - 36.0 g/dL Final  . RDW 06/20/2017 15.7* 11.5 - 15.5 % Final  . Platelets 06/20/2017 187  150 - 400 K/uL Final  . Neutrophils Relative % 06/20/2017 77  % Final  . Neutro Abs 06/20/2017 4.6  1.7 - 7.7 K/uL Final  . Lymphocytes Relative 06/20/2017 14  % Final  . Lymphs Abs 06/20/2017 0.8  0.7 - 4.0 K/uL Final  . Monocytes Relative 06/20/2017 8  % Final  . Monocytes Absolute 06/20/2017 0.5  0.1 - 1.0 K/uL Final  . Eosinophils Relative 06/20/2017 2  % Final  . Eosinophils Absolute 06/20/2017 0.1  0.0 - 0.7 K/uL Final  . Basophils Relative 06/20/2017 1  % Final  . Basophils Absolute 06/20/2017 0.0  0.0 - 0.1 K/uL Final  . Sodium 06/20/2017 140  135 - 145 mmol/L Final  . Potassium 06/20/2017 3.7  3.5 - 5.1 mmol/L Final   DELTA CHECK NOTED  . Chloride 06/20/2017 104  101 - 111 mmol/L Final  . CO2 06/20/2017 28  22 - 32 mmol/L Final  . Glucose, Bld 06/20/2017 105* 65 - 99 mg/dL Final  . BUN 06/20/2017 9  6 - 20 mg/dL Final  . Creatinine, Ser 06/20/2017 0.53  0.44 - 1.00 mg/dL Final  . Calcium 06/20/2017 8.8* 8.9 - 10.3 mg/dL Final  .  GFR calc non Af Amer 06/20/2017 >60  >60 mL/min Final  . GFR calc Af Amer 06/20/2017 >60  >60 mL/min Final   Comment: (NOTE) The eGFR has been calculated using the CKD EPI equation. This calculation has not been validated in all clinical situations. eGFR's persistently <60 mL/min signify possible Chronic Kidney Disease.   . Anion gap 06/20/2017 8  5 - 15 Final  . Magnesium 06/20/2017 2.0  1.7 - 2.4 mg/dL Final  Appointment on 06/03/2017  Component Date Value Ref Range Status  . WBC 06/03/2017 5.1  3.9 - 10.3 10e3/uL Final  . NEUT# 06/03/2017 3.7  1.5 - 6.5 10e3/uL Final  . HGB 06/03/2017 8.9* 11.6 - 15.9 g/dL Final  . HCT 06/03/2017 27.8* 34.8 - 46.6 % Final  . Platelets 06/03/2017 114* 145 - 400 10e3/uL Final  . MCV 06/03/2017 99.3  79.5 - 101.0 fL Final  . MCH 06/03/2017 31.8   25.1 - 34.0 pg Final  . MCHC 06/03/2017 32.0  31.5 - 36.0 g/dL Final  . RBC 06/03/2017 2.80* 3.70 - 5.45 10e6/uL Final  . RDW 06/03/2017 23.7* 11.2 - 14.5 % Final  . lymph# 06/03/2017 0.9  0.9 - 3.3 10e3/uL Final  . MONO# 06/03/2017 0.3  0.1 - 0.9 10e3/uL Final  . Eosinophils Absolute 06/03/2017 0.2  0.0 - 0.5 10e3/uL Final  . Basophils Absolute 06/03/2017 0.0  0.0 - 0.1 10e3/uL Final  . NEUT% 06/03/2017 71.9  38.4 - 76.8 % Final  . LYMPH% 06/03/2017 17.7  14.0 - 49.7 % Final  . MONO% 06/03/2017 5.3  0.0 - 14.0 % Final  . EOS% 06/03/2017 4.7  0.0 - 7.0 % Final  . BASO% 06/03/2017 0.4  0.0 - 2.0 % Final  . nRBC 06/03/2017 0  0 - 0 % Final  . Sodium 06/03/2017 142  136 - 145 mEq/L Final  . Potassium 06/03/2017 3.1* 3.5 - 5.1 mEq/L Final  . Chloride 06/03/2017 103  98 - 109 mEq/L Final  . CO2 06/03/2017 27  22 - 29 mEq/L Final  . Glucose 06/03/2017 96  70 - 140 mg/dl Final   Glucose reference range is for nonfasting patients. Fasting glucose reference range is 70- 100.  Marland Kitchen BUN 06/03/2017 9.2  7.0 - 26.0 mg/dL Final  . Creatinine 06/03/2017 0.8  0.6 - 1.1 mg/dL Final  . Total Bilirubin 06/03/2017 0.82  0.20 - 1.20 mg/dL Final  . Alkaline Phosphatase 06/03/2017 76  40 - 150 U/L Final  . AST 06/03/2017 13  5 - 34 U/L Final  . ALT 06/03/2017 9  0 - 55 U/L Final  . Total Protein 06/03/2017 7.1  6.4 - 8.3 g/dL Final  . Albumin 06/03/2017 3.5  3.5 - 5.0 g/dL Final  . Calcium 06/03/2017 8.8  8.4 - 10.4 mg/dL Final  . Anion Gap 06/03/2017 11  3 - 11 mEq/L Final  . EGFR 06/03/2017 >60  >60 ml/min/1.73 m2 Final   eGFR is calculated using the CKD-EPI Creatinine Equation (2009)    RADIOGRAPHIC STUDIES: I have personally reviewed the radiological images as listed and agree with the findings in the report  No results found.  ASSESSMENT/PLAN Stage IIIA (T2b, N2, M0) non-small cell lung cancer, squamous cell carcinoma presented with large right upper lobe lung mass in addition to right hilar and  mediastinal lymphadenopathy diagnosed in July 2018. PDL 1 expression is negative. Treated with concurrent chemoradiation with weekly carboplatin for AUC of 2 and paclitaxel 45 MG/M2 status post 6 cycles, last dose  was given 05/02/2017. Subsequent reimaging demonstrated a response to treatment.  PLAN: -Patient has had a delay to starting consolidation therapy with imfinzi due to her recent HCAP. -She wishes to transfer her care here since its closer to her home. -Will order a stat referral to Dr. Arnoldo Morale for chemoport placement, since patient states she has bad veins now. -Reviewed side effects of imfinzi again with the patient, but we will give her chemoteaching again on the day that she starts her treatment. Tentatively plan to start on 07/12/17. -RTC with cycle 2 of imfinzi to assess tolerance to therapy.   All questions were answered. The patient knows to call the clinic with any problems, questions or concerns.  This note was electronically signed.    Twana First, MD  06/28/2017 9:36 AM

## 2017-06-28 NOTE — Addendum Note (Signed)
Addended by: Joanne Gavel T on: 06/28/2017 10:54 AM   Modules accepted: Orders

## 2017-07-04 ENCOUNTER — Ambulatory Visit: Payer: Medicaid Other

## 2017-07-04 ENCOUNTER — Other Ambulatory Visit: Payer: Medicaid Other

## 2017-07-04 ENCOUNTER — Ambulatory Visit: Payer: Medicaid Other | Admitting: Oncology

## 2017-07-05 ENCOUNTER — Encounter: Payer: Self-pay | Admitting: General Surgery

## 2017-07-05 ENCOUNTER — Ambulatory Visit: Payer: Medicaid Other | Admitting: General Surgery

## 2017-07-05 ENCOUNTER — Encounter (HOSPITAL_COMMUNITY)
Admission: RE | Admit: 2017-07-05 | Discharge: 2017-07-05 | Disposition: A | Discharge status: Home / Self Care | Payer: Medicaid Other | Source: Ambulatory Visit | Attending: General Surgery | Admitting: General Surgery

## 2017-07-05 VITALS — BP 120/68 | HR 83 | Temp 97.8°F | Resp 18 | Ht 60.0 in | Wt 150.0 lb

## 2017-07-05 DIAGNOSIS — C3491 Malignant neoplasm of unspecified part of right bronchus or lung: Secondary | ICD-10-CM | POA: Diagnosis not present

## 2017-07-05 NOTE — H&P (Signed)
[facility] History and Physical  Reason for Referral: Port Placement  Referring Physician: Dr. Talbert Cage  Chief Complaint    Lung Cancer      Sydney Blair is a 61 y.o. female.  HPI: Sydney Blair is a 61 yo with multiple medical issues including Stage III non small cell cancer who has undergone chemotherapy and radiation. She has also been treated for breast cancer in the past s/p right partial mastectomy. She reports never having a port and only having peripheral access, but there are plans for Imfinzi every 2 weeks for likely 1 year per the patient. She needs access at this time. Her last chemotherapy was 04/2017, and she was recently treated for a pneumonia but has completed her antibiotics.    She has no current fevers or chills, and says she is to start the [facility] 11/20.   Past Medical History:  Diagnosis Date  . Anxiety   . ARF (acute renal failure) ([place]) 08/07/2011   ?   Marland Kitchen Arthritis   . Cancer ([place]) 2005   breast-right  . Chronic back pain   . COPD (chronic obstructive pulmonary disease) ([place])   . Depression   . Depression with anxiety   . Dyspnea    on exertion  . Fibromyalgia   . GERD (gastroesophageal reflux disease)   . History of radiation therapy 03/24/17-05/12/17   right lun 2 Gy in 30 fractions  . Hypercholesteremia   . Hypothyroidism   . Hypothyroidism 06/19/2017  . Incontinence   . Neuropathy   . Peripheral neuropathy   . Sciatic pain   . Sebaceous cyst    lt labial location  . Sleep apnea    uses CPAP  . Stage III squamous cell carcinoma of right lung ([place]) 03/10/2017  . Tobacco abuse     Past Surgical History:  Procedure Laterality Date  . bladder tack    . BREAST LUMPECTOMY     right with node dissection  . COLONOSCOPY  11/11/2010   [id number] rectal polyp and splenic flexure otherwise normal; pathology showed  tubular adenomas. Next TCS 10/2015.  Marland Kitchen ESOPHAGOGASTRODUODENOSCOPY  11/11/2010   JXB:[id number] hiatal hernia, 54 French  dilator    Family History  Problem Relation Age of Onset  . Cancer Other   . Heart attack Other   . Lung disease Other   . Emphysema Mother   . Autoimmune disease Neg Hx     Social History   Tobacco Use  . Smoking status: Current Every Day Smoker    Packs/day: 1.00    Years: 35.00    Pack years: 35.00    Types: Cigarettes  . Smokeless tobacco: Never Used  . Tobacco comment: has cut back to one pack daily  Substance Use Topics  . Alcohol use: No    Alcohol/week: 0.0 oz  . Drug use: No    Medications: I have reviewed the patient's current medications. Allergies as of 07/05/2017      Reactions   Penicillins Anaphylaxis, Other (See Comments)   Has patient had a PCN reaction causing immediate rash, facial/tongue/throat swelling, SOB or lightheadedness with hypotension:Yes Has patient had a PCN reaction causing severe rash involving mucus membranes or skin necrosis:Yes Has patient had a PCN reaction that required hospitalization:No Has patient had a PCN reaction occurring within the last 10 years:No If all of the above answers are "NO", then may proceed with Cephalosporin use.      Medication List  Accurate as of 07/05/17 11:24 AM. Always use your most recent med list.          acetaminophen 500 MG tablet Commonly known as:  TYLENOL Take 500 mg by mouth every 6 (six) hours as needed for headache.   albuterol 108 (90 Base) MCG/ACT inhaler Commonly known as:  PROVENTIL HFA;VENTOLIN HFA Inhale 2 puffs into the lungs every 6 (six) hours as needed for wheezing or shortness of breath.   aspirin 81 MG chewable tablet Chew 81 mg by mouth daily.   budesonide-formoterol 160-4.5 MCG/ACT inhaler Commonly known as:  SYMBICORT Inhale 2 puffs into the lungs daily.   IMFINZI IV Inject into the vein. Every 2 weeks   levalbuterol 0.63 MG/3ML nebulizer solution Commonly known as:  XOPENEX Take 3 mLs (0.63 mg total) by nebulization 3 (three) times daily.     levothyroxine 100 MCG tablet Commonly known as:  SYNTHROID, LEVOTHROID Take 100 mcg by mouth daily before breakfast. Reported on 02/03/2016   lidocaine-prilocaine cream Commonly known as:  EMLA Apply a quarter size amount to affected area 1 hour prior to coming to chemotherapy.   metoprolol tartrate 50 MG tablet Commonly known as:  LOPRESSOR Take 1 tablet (50 mg total) by mouth 3 (three) times daily.   ondansetron 8 MG tablet Commonly known as:  ZOFRAN Take 1 tablet (8 mg total) every 8 (eight) hours as needed by mouth for nausea or vomiting.   prochlorperazine 10 MG tablet Commonly known as:  COMPAZINE Take 1 tablet (10 mg total) every 6 (six) hours as needed by mouth for nausea or vomiting.   tiotropium 18 MCG inhalation capsule Commonly known as:  SPIRIVA Place 1 capsule (18 mcg total) into inhaler and inhale daily.        ROS:  A comprehensive review of systems was negative except for: Constitutional: positive for headache Eyes: positive for blurred vision Respiratory: positive for cough and SOB Gastrointestinal: positive for reflux symptoms Musculoskeletal: positive for back pain  Blood pressure 120/68, pulse 83, temperature 97.8 F (36.6 C), resp. rate 18, height 5' (1.524 m), weight 150 lb (68 kg). Physical Exam  Constitutional: She is oriented to person, place, and time and well-developed, well-nourished, and in no distress.  HENT:  Head: Normocephalic.  Eyes: Pupils are equal, round, and reactive to light.  Cardiovascular: Normal rate and regular rhythm.  Pulmonary/Chest: Effort normal and breath sounds normal.  Right breast lumpectomy scar  Abdominal: Soft. She exhibits no distension. There is no tenderness.  Musculoskeletal: Normal range of motion.  Neurological: She is oriented to person, place, and time.  Skin: Skin is warm and dry.  Psychiatric: Mood, memory, affect and judgment normal.  Vitals reviewed.   Results: Labs done 10/29 reviewed in  chart.   Assessment & Plan:  Sydney Blair is a 61 y.o. female with Stage III non small cell lung cancer, needing Imfinzi for a year every 2 weeks. She needs a port placed. She is right handed.   -OR port placement 11/14, plans to start therapy 11/20  -Preop orders completed    All questions were answered to the satisfaction of the patient and family.  The risk and benefits of port placement were discussed including but not limited to bleeding, infection, and pneumothorax risk.  After careful consideration, Sydney Blair has decided to proceed.    Virl Cagey 07/05/2017, 11:24 AM

## 2017-07-05 NOTE — Patient Instructions (Signed)
Implanted Port Home Guide An implanted port is a type of central line that is placed under the skin. Central lines are used to provide IV access when treatment or nutrition needs to be given through a person's veins. Implanted ports are used for long-term IV access. An implanted port may be placed because:  You need IV medicine that would be irritating to the small veins in your hands or arms.  You need long-term IV medicines, such as antibiotics.  You need IV nutrition for a long period.  You need frequent blood draws for lab tests.  You need dialysis.  Implanted ports are usually placed in the chest area, but they can also be placed in the upper arm, the abdomen, or the leg. An implanted port has two main parts:  Reservoir. The reservoir is round and will appear as a small, raised area under your skin. The reservoir is the part where a needle is inserted to give medicines or draw blood.  Catheter. The catheter is a thin, flexible tube that extends from the reservoir. The catheter is placed into a large vein. Medicine that is inserted into the reservoir goes into the catheter and then into the vein.  How will I care for my incision site? Do not get the incision site wet. Bathe or shower as directed by your health care provider. How is my port accessed? Special steps must be taken to access the port:  Before the port is accessed, a numbing cream can be placed on the skin. This helps numb the skin over the port site.  Your health care provider uses a sterile technique to access the port. ? Your health care provider must put on a mask and sterile gloves. ? The skin over your port is cleaned carefully with an antiseptic and allowed to dry. ? The port is gently pinched between sterile gloves, and a needle is inserted into the port.  Only "non-coring" port needles should be used to access the port. Once the port is accessed, a blood return should be checked. This helps ensure that the port  is in the vein and is not clogged.  If your port needs to remain accessed for a constant infusion, a clear (transparent) bandage will be placed over the needle site. The bandage and needle will need to be changed every week, or as directed by your health care provider.  Keep the bandage covering the needle clean and dry. Do not get it wet. Follow your health care provider's instructions on how to take a shower or bath while the port is accessed.  If your port does not need to stay accessed, no bandage is needed over the port.  What is flushing? Flushing helps keep the port from getting clogged. Follow your health care provider's instructions on how and when to flush the port. Ports are usually flushed with saline solution or a medicine called heparin. The need for flushing will depend on how the port is used.  If the port is used for intermittent medicines or blood draws, the port will need to be flushed: ? After medicines have been given. ? After blood has been drawn. ? As part of routine maintenance.  If a constant infusion is running, the port may not need to be flushed.  How long will my port stay implanted? The port can stay in for as long as your health care provider thinks it is needed. When it is time for the port to come out, surgery will be   done to remove it. The procedure is similar to the one performed when the port was put in. When should I seek immediate medical care? When you have an implanted port, you should seek immediate medical care if:  You notice a bad smell coming from the incision site.  You have swelling, redness, or drainage at the incision site.  You have more swelling or pain at the port site or the surrounding area.  You have a fever that is not controlled with medicine.  This information is not intended to replace advice given to you by your health care provider. Make sure you discuss any questions you have with your health care provider. Document  Released: 08/09/2005 Document Revised: 01/15/2016 Document Reviewed: 04/16/2013 Elsevier Interactive Patient Education  2017 [employer]  

## 2017-07-05 NOTE — Progress Notes (Signed)
[facility] History and Physical  Reason for Referral: Port Placement  Referring Physician: Dr. Talbert Cage  Chief Complaint    Lung Cancer      Sydney Blair is a 61 y.o. female.  HPI: Sydney Blair is a 61 yo with multiple medical issues including Stage III non small cell cancer who has undergone chemotherapy and radiation. She has also been treated for breast cancer in the past s/p right partial mastectomy. She reports never having a port and only having peripheral access, but there are plans for Imfinzi every 2 weeks for likely 1 year per the patient. She needs access at this time. Her last chemotherapy was 04/2017, and she was recently treated for a pneumonia but has completed her antibiotics.    She has no current fevers or chills, and says she is to start the [facility] 11/20.   Past Medical History:  Diagnosis Date  . Anxiety   . ARF (acute renal failure) ([place]) 08/07/2011   ?   Marland Kitchen Arthritis   . Cancer ([place]) 2005   breast-right  . Chronic back pain   . COPD (chronic obstructive pulmonary disease) ([place])   . Depression   . Depression with anxiety   . Dyspnea    on exertion  . Fibromyalgia   . GERD (gastroesophageal reflux disease)   . History of radiation therapy 03/24/17-05/12/17   right lun 2 Gy in 30 fractions  . Hypercholesteremia   . Hypothyroidism   . Hypothyroidism 06/19/2017  . Incontinence   . Neuropathy   . Peripheral neuropathy   . Sciatic pain   . Sebaceous cyst    lt labial location  . Sleep apnea    uses CPAP  . Stage III squamous cell carcinoma of right lung ([place]) 03/10/2017  . Tobacco abuse     Past Surgical History:  Procedure Laterality Date  . bladder tack    . BREAST LUMPECTOMY     right with node dissection  . COLONOSCOPY  11/11/2010   HGD:JMEQASTMHD rectal polyp and splenic flexure otherwise normal; pathology showed  tubular adenomas. Next TCS 10/2015.  Marland Kitchen ESOPHAGOGASTRODUODENOSCOPY  11/11/2010   QQI:WLNLG hiatal hernia, 60 French  dilator    Family History  Problem Relation Age of Onset  . Cancer Other   . Heart attack Other   . Lung disease Other   . Emphysema Mother   . Autoimmune disease Neg Hx     Social History   Tobacco Use  . Smoking status: Current Every Day Smoker    Packs/day: 1.00    Years: 35.00    Pack years: 35.00    Types: Cigarettes  . Smokeless tobacco: Never Used  . Tobacco comment: has cut back to one pack daily  Substance Use Topics  . Alcohol use: No    Alcohol/week: 0.0 oz  . Drug use: No    Medications: I have reviewed the patient's current medications. Allergies as of 07/05/2017      Reactions   Penicillins Anaphylaxis, Other (See Comments)   Has patient had a PCN reaction causing immediate rash, facial/tongue/throat swelling, SOB or lightheadedness with hypotension:Yes Has patient had a PCN reaction causing severe rash involving mucus membranes or skin necrosis:Yes Has patient had a PCN reaction that required hospitalization:No Has patient had a PCN reaction occurring within the last 10 years:No If all of the above answers are "NO", then may proceed with Cephalosporin use.      Medication List  Accurate as of 07/05/17 11:24 AM. Always use your most recent med list.          acetaminophen 500 MG tablet Commonly known as:  TYLENOL Take 500 mg by mouth every 6 (six) hours as needed for headache.   albuterol 108 (90 Base) MCG/ACT inhaler Commonly known as:  PROVENTIL HFA;VENTOLIN HFA Inhale 2 puffs into the lungs every 6 (six) hours as needed for wheezing or shortness of breath.   aspirin 81 MG chewable tablet Chew 81 mg by mouth daily.   budesonide-formoterol 160-4.5 MCG/ACT inhaler Commonly known as:  SYMBICORT Inhale 2 puffs into the lungs daily.   IMFINZI IV Inject into the vein. Every 2 weeks   levalbuterol 0.63 MG/3ML nebulizer solution Commonly known as:  XOPENEX Take 3 mLs (0.63 mg total) by nebulization 3 (three) times daily.     levothyroxine 100 MCG tablet Commonly known as:  SYNTHROID, LEVOTHROID Take 100 mcg by mouth daily before breakfast. Reported on 02/03/2016   lidocaine-prilocaine cream Commonly known as:  EMLA Apply a quarter size amount to affected area 1 hour prior to coming to chemotherapy.   metoprolol tartrate 50 MG tablet Commonly known as:  LOPRESSOR Take 1 tablet (50 mg total) by mouth 3 (three) times daily.   ondansetron 8 MG tablet Commonly known as:  ZOFRAN Take 1 tablet (8 mg total) every 8 (eight) hours as needed by mouth for nausea or vomiting.   prochlorperazine 10 MG tablet Commonly known as:  COMPAZINE Take 1 tablet (10 mg total) every 6 (six) hours as needed by mouth for nausea or vomiting.   tiotropium 18 MCG inhalation capsule Commonly known as:  SPIRIVA Place 1 capsule (18 mcg total) into inhaler and inhale daily.        ROS:  A comprehensive review of systems was negative except for: Constitutional: positive for headache Eyes: positive for blurred vision Respiratory: positive for cough and SOB Gastrointestinal: positive for reflux symptoms Musculoskeletal: positive for back pain  Blood pressure 120/68, pulse 83, temperature 97.8 F (36.6 C), resp. rate 18, height 5' (1.524 m), weight 150 lb (68 kg). Physical Exam  Constitutional: She is oriented to person, place, and time and well-developed, well-nourished, and in no distress.  HENT:  Head: Normocephalic.  Eyes: Pupils are equal, round, and reactive to light.  Cardiovascular: Normal rate and regular rhythm.  Pulmonary/Chest: Effort normal and breath sounds normal.  Right breast lumpectomy scar  Abdominal: Soft. She exhibits no distension. There is no tenderness.  Musculoskeletal: Normal range of motion.  Neurological: She is oriented to person, place, and time.  Skin: Skin is warm and dry.  Psychiatric: Mood, memory, affect and judgment normal.  Vitals reviewed.   Results: Labs done 10/29 reviewed in  chart.   Assessment & Plan:  Sydney Blair is a 61 y.o. female with Stage III non small cell lung cancer, needing Imfinzi for a year every 2 weeks. She needs a port placed. She is right handed.   -OR port placement 11/14, plans to start therapy 11/20  -Preop orders completed    All questions were answered to the satisfaction of the patient and family.  The risk and benefits of port placement were discussed including but not limited to bleeding, infection, and pneumothorax risk.  After careful consideration, Sydney Blair has decided to proceed.    Virl Cagey 07/05/2017, 11:24 AM

## 2017-07-06 ENCOUNTER — Encounter (HOSPITAL_COMMUNITY)
Admission: RE | Disposition: A | Discharge status: Home / Self Care | Payer: Self-pay | Source: Ambulatory Visit | Attending: General Surgery

## 2017-07-06 ENCOUNTER — Ambulatory Visit (HOSPITAL_COMMUNITY): Payer: Medicaid Other

## 2017-07-06 ENCOUNTER — Ambulatory Visit (HOSPITAL_COMMUNITY)
Admission: RE | Admit: 2017-07-06 | Discharge: 2017-07-06 | Disposition: A | Discharge status: Home / Self Care | Payer: Medicaid Other | Source: Ambulatory Visit | Attending: General Surgery | Admitting: General Surgery

## 2017-07-06 ENCOUNTER — Ambulatory Visit (HOSPITAL_COMMUNITY): Payer: Medicaid Other | Admitting: Anesthesiology

## 2017-07-06 ENCOUNTER — Encounter (HOSPITAL_COMMUNITY): Payer: Self-pay | Admitting: Emergency Medicine

## 2017-07-06 DIAGNOSIS — F418 Other specified anxiety disorders: Secondary | ICD-10-CM | POA: Diagnosis not present

## 2017-07-06 DIAGNOSIS — Z9221 Personal history of antineoplastic chemotherapy: Secondary | ICD-10-CM | POA: Insufficient documentation

## 2017-07-06 DIAGNOSIS — Z880 Allergy status to penicillin: Secondary | ICD-10-CM | POA: Insufficient documentation

## 2017-07-06 DIAGNOSIS — M797 Fibromyalgia: Secondary | ICD-10-CM | POA: Diagnosis not present

## 2017-07-06 DIAGNOSIS — Z7951 Long term (current) use of inhaled steroids: Secondary | ICD-10-CM | POA: Diagnosis not present

## 2017-07-06 DIAGNOSIS — F17210 Nicotine dependence, cigarettes, uncomplicated: Secondary | ICD-10-CM | POA: Insufficient documentation

## 2017-07-06 DIAGNOSIS — Z79899 Other long term (current) drug therapy: Secondary | ICD-10-CM | POA: Diagnosis not present

## 2017-07-06 DIAGNOSIS — Z7982 Long term (current) use of aspirin: Secondary | ICD-10-CM | POA: Insufficient documentation

## 2017-07-06 DIAGNOSIS — Z79890 Hormone replacement therapy (postmenopausal): Secondary | ICD-10-CM | POA: Diagnosis not present

## 2017-07-06 DIAGNOSIS — M549 Dorsalgia, unspecified: Secondary | ICD-10-CM | POA: Diagnosis not present

## 2017-07-06 DIAGNOSIS — Z836 Family history of other diseases of the respiratory system: Secondary | ICD-10-CM | POA: Insufficient documentation

## 2017-07-06 DIAGNOSIS — G629 Polyneuropathy, unspecified: Secondary | ICD-10-CM | POA: Insufficient documentation

## 2017-07-06 DIAGNOSIS — K449 Diaphragmatic hernia without obstruction or gangrene: Secondary | ICD-10-CM | POA: Diagnosis not present

## 2017-07-06 DIAGNOSIS — Z87892 Personal history of anaphylaxis: Secondary | ICD-10-CM | POA: Insufficient documentation

## 2017-07-06 DIAGNOSIS — C3491 Malignant neoplasm of unspecified part of right bronchus or lung: Secondary | ICD-10-CM

## 2017-07-06 DIAGNOSIS — Z853 Personal history of malignant neoplasm of breast: Secondary | ICD-10-CM | POA: Insufficient documentation

## 2017-07-06 DIAGNOSIS — G4730 Sleep apnea, unspecified: Secondary | ICD-10-CM | POA: Diagnosis not present

## 2017-07-06 DIAGNOSIS — G8929 Other chronic pain: Secondary | ICD-10-CM | POA: Insufficient documentation

## 2017-07-06 DIAGNOSIS — Z95828 Presence of other vascular implants and grafts: Secondary | ICD-10-CM

## 2017-07-06 DIAGNOSIS — K219 Gastro-esophageal reflux disease without esophagitis: Secondary | ICD-10-CM | POA: Insufficient documentation

## 2017-07-06 DIAGNOSIS — Z8249 Family history of ischemic heart disease and other diseases of the circulatory system: Secondary | ICD-10-CM | POA: Insufficient documentation

## 2017-07-06 DIAGNOSIS — J449 Chronic obstructive pulmonary disease, unspecified: Secondary | ICD-10-CM | POA: Diagnosis not present

## 2017-07-06 DIAGNOSIS — E039 Hypothyroidism, unspecified: Secondary | ICD-10-CM | POA: Insufficient documentation

## 2017-07-06 DIAGNOSIS — Z9889 Other specified postprocedural states: Secondary | ICD-10-CM | POA: Insufficient documentation

## 2017-07-06 DIAGNOSIS — E7800 Pure hypercholesterolemia, unspecified: Secondary | ICD-10-CM | POA: Insufficient documentation

## 2017-07-06 HISTORY — PX: PORTACATH PLACEMENT: SHX2246

## 2017-07-06 SURGERY — INSERTION, TUNNELED CENTRAL VENOUS DEVICE, WITH PORT
Anesthesia: Monitor Anesthesia Care | Laterality: Left

## 2017-07-06 MED ORDER — CHLORHEXIDINE GLUCONATE CLOTH 2 % EX PADS: 6.0000 | MEDICATED_PAD | Freq: Once | CUTANEOUS | Status: DC

## 2017-07-06 MED ORDER — PHENYLEPHRINE 40 MCG/ML (10ML) SYRINGE FOR IV PUSH (FOR BLOOD PRESSURE SUPPORT)
PREFILLED_SYRINGE | INTRAVENOUS | Status: AC
  Filled 2017-07-06: qty 10

## 2017-07-06 MED ORDER — FENTANYL CITRATE (PF) 100 MCG/2ML IJ SOLN
INTRAMUSCULAR | Status: AC
  Filled 2017-07-06: qty 2

## 2017-07-06 MED ORDER — OXYCODONE HCL 5 MG PO TABS: 5.0000 mg | ORAL_TABLET | Freq: Four times a day (QID) | ORAL | 0 refills | Status: DC | PRN

## 2017-07-06 MED ORDER — SODIUM CHLORIDE 0.9 % IJ SOLN
INTRAMUSCULAR | Status: AC
  Filled 2017-07-06: qty 10

## 2017-07-06 MED ORDER — LACTATED RINGERS IV SOLN
INTRAVENOUS | Status: DC
  Administered 2017-07-06: 09:00:00 via INTRAVENOUS

## 2017-07-06 MED ORDER — SODIUM CHLORIDE 0.9 % IV SOLN
INTRAVENOUS | Status: AC | PRN
  Administered 2017-07-06: 5 mL

## 2017-07-06 MED ORDER — HEPARIN SOD (PORK) LOCK FLUSH 100 UNIT/ML IV SOLN
INTRAVENOUS | Status: AC
  Filled 2017-07-06: qty 5

## 2017-07-06 MED ORDER — LIDOCAINE HCL (PF) 1 % IJ SOLN
INTRAMUSCULAR | Status: AC
  Filled 2017-07-06: qty 30

## 2017-07-06 MED ORDER — LIDOCAINE HCL (PF) 1 % IJ SOLN
INTRAMUSCULAR | Status: AC
  Filled 2017-07-06: qty 5

## 2017-07-06 MED ORDER — FENTANYL CITRATE (PF) 100 MCG/2ML IJ SOLN
INTRAMUSCULAR | Status: DC | PRN
Start: 2017-07-06 — End: 2017-07-06
  Administered 2017-07-06 (×3): 25 ug via INTRAVENOUS

## 2017-07-06 MED ORDER — FENTANYL CITRATE (PF) 100 MCG/2ML IJ SOLN: 25.0000 ug | INTRAMUSCULAR | Status: DC | PRN

## 2017-07-06 MED ORDER — VANCOMYCIN HCL IN DEXTROSE 1-5 GM/200ML-% IV SOLN
1000.0000 mg | INTRAVENOUS | Status: AC
  Administered 2017-07-06: 1000 mg via INTRAVENOUS
  Filled 2017-07-06: qty 200

## 2017-07-06 MED ORDER — PHENYLEPHRINE HCL 10 MG/ML IJ SOLN
INTRAMUSCULAR | Status: DC | PRN
Start: 2017-07-06 — End: 2017-07-06
  Administered 2017-07-06 (×5): 80 ug via INTRAVENOUS

## 2017-07-06 MED ORDER — LIDOCAINE HCL (PF) 1 % IJ SOLN
INTRAMUSCULAR | Status: DC | PRN
  Administered 2017-07-06: 40 mg

## 2017-07-06 MED ORDER — HEPARIN SOD (PORK) LOCK FLUSH 100 UNIT/ML IV SOLN
INTRAVENOUS | Status: DC | PRN
  Administered 2017-07-06: 500 [IU]

## 2017-07-06 MED ORDER — PROPOFOL 500 MG/50ML IV EMUL
INTRAVENOUS | Status: DC | PRN
  Administered 2017-07-06: 150 ug/kg/min via INTRAVENOUS

## 2017-07-06 MED ORDER — MIDAZOLAM HCL 2 MG/2ML IJ SOLN
1.0000 mg | INTRAMUSCULAR | Status: AC
  Administered 2017-07-06: 2 mg via INTRAVENOUS
  Filled 2017-07-06: qty 2

## 2017-07-06 MED ORDER — LIDOCAINE HCL (PF) 1 % IJ SOLN
INTRAMUSCULAR | Status: DC | PRN
  Administered 2017-07-06: 9 mL

## 2017-07-06 MED ORDER — PROPOFOL 10 MG/ML IV BOLUS
INTRAVENOUS | Status: AC
  Filled 2017-07-06: qty 20

## 2017-07-06 MED ORDER — PROPOFOL 10 MG/ML IV BOLUS
INTRAVENOUS | Status: DC | PRN
  Administered 2017-07-06: 10 mg via INTRAVENOUS

## 2017-07-06 MED ORDER — CHLORHEXIDINE GLUCONATE CLOTH 2 % EX PADS
6.0000 | MEDICATED_PAD | Freq: Once | CUTANEOUS | Status: AC
  Administered 2017-07-06: 6 via TOPICAL

## 2017-07-06 SURGICAL SUPPLY — 34 items
ADH SKN CLS APL DERMABOND .7 (GAUZE/BANDAGES/DRESSINGS) ×1
BAG DECANTER FOR FLEXI CONT (MISCELLANEOUS) ×2 IMPLANT
BAG HAMPER (MISCELLANEOUS) ×2 IMPLANT
CHLORAPREP W/TINT 10.5 ML (MISCELLANEOUS) ×2 IMPLANT
CLOTH BEACON ORANGE TIMEOUT ST (SAFETY) ×2 IMPLANT
COVER LIGHT HANDLE STERIS (MISCELLANEOUS) ×4 IMPLANT
DECANTER SPIKE VIAL GLASS SM (MISCELLANEOUS) ×2 IMPLANT
DERMABOND ADVANCED (GAUZE/BANDAGES/DRESSINGS) ×1
DERMABOND ADVANCED .7 DNX12 (GAUZE/BANDAGES/DRESSINGS) ×1 IMPLANT
DRAPE C-ARM FOLDED MOBILE STRL (DRAPES) ×2 IMPLANT
ELECT REM PT RETURN 9FT ADLT (ELECTROSURGICAL) ×2
ELECTRODE REM PT RTRN 9FT ADLT (ELECTROSURGICAL) ×1 IMPLANT
GLOVE BIO SURGEON STRL SZ 6.5 (GLOVE) ×2 IMPLANT
GLOVE BIO SURGEON STRL SZ7 (GLOVE) ×1 IMPLANT
GLOVE BIOGEL PI IND STRL 6.5 (GLOVE) ×1 IMPLANT
GLOVE BIOGEL PI IND STRL 7.0 (GLOVE) ×1 IMPLANT
GLOVE BIOGEL PI INDICATOR 6.5 (GLOVE) ×1
GLOVE BIOGEL PI INDICATOR 7.0 (GLOVE) ×1
GOWN STRL REUS W/TWL LRG LVL3 (GOWN DISPOSABLE) ×4 IMPLANT
IV NS 500ML (IV SOLUTION) ×2
IV NS 500ML BAXH (IV SOLUTION) ×1 IMPLANT
KIT PORT POWER 8FR ISP MRI (Port) ×2 IMPLANT
KIT ROOM TURNOVER APOR (KITS) ×2 IMPLANT
MANIFOLD NEPTUNE II (INSTRUMENTS) ×2 IMPLANT
NDL HYPO 25X1 1.5 SAFETY (NEEDLE) ×1 IMPLANT
NEEDLE HYPO 25X1 1.5 SAFETY (NEEDLE) ×2 IMPLANT
PACK MINOR (CUSTOM PROCEDURE TRAY) ×2 IMPLANT
PAD ARMBOARD 7.5X6 YLW CONV (MISCELLANEOUS) ×2 IMPLANT
SET BASIN LINEN APH (SET/KITS/TRAYS/PACK) ×2 IMPLANT
SUT MNCRL AB 4-0 PS2 18 (SUTURE) ×2 IMPLANT
SUT VIC AB 3-0 SH 27 (SUTURE) ×2
SUT VIC AB 3-0 SH 27X BRD (SUTURE) ×1 IMPLANT
SYR 20CC LL (SYRINGE) ×2 IMPLANT
SYR CONTROL 10ML LL (SYRINGE) ×2 IMPLANT

## 2017-07-06 NOTE — Anesthesia Postprocedure Evaluation (Signed)
Anesthesia Post Note  Patient: Sydney Blair  Procedure(s) Performed: INSERTION PORT-A-CATH LEFT SUBCLAVIAN (Left )  Patient location during evaluation: PACU Anesthesia Type: MAC Level of consciousness: awake, awake and alert, oriented and patient cooperative Pain management: pain level controlled Vital Signs Assessment: post-procedure vital signs reviewed and stable Respiratory status: spontaneous breathing, nonlabored ventilation, respiratory function stable and patient connected to nasal cannula oxygen Cardiovascular status: stable Postop Assessment: no apparent nausea or vomiting Anesthetic complications: no     Last Vitals:  Vitals:   07/06/17 0950 07/06/17 1108  BP: (!) 147/76   Pulse:    Resp: (!) 25 (P) 20  Temp:  (P) 37.1 C  SpO2: 95% (P) 98%    Last Pain:  Vitals:   07/06/17 0835  TempSrc: Oral  PainSc: 5                  Damita Eppard L

## 2017-07-06 NOTE — Transfer of Care (Signed)
Immediate Anesthesia Transfer of Care Note  Patient: Sydney Blair  Procedure(s) Performed: INSERTION PORT-A-CATH LEFT SUBCLAVIAN (Left )  Patient Location: PACU  Anesthesia Type:MAC  Level of Consciousness: awake, alert , oriented and patient cooperative  Airway & Oxygen Therapy: Patient Spontanous Breathing and Patient connected to nasal cannula oxygen  Post-op Assessment: Report given to RN and Post -op Vital signs reviewed and stable  Post vital signs: Reviewed and stable  Last Vitals:  Vitals:   07/06/17 0950 07/06/17 1108  BP: (!) 147/76   Pulse:    Resp: (!) 25 (P) 20  Temp:  (P) 37.1 C  SpO2: 95% (P) 98%    Last Pain:  Vitals:   07/06/17 0835  TempSrc: Oral  PainSc: 5          Complications: No apparent anesthesia complications

## 2017-07-06 NOTE — Anesthesia Preprocedure Evaluation (Signed)
Anesthesia Evaluation  Patient identified by MRN, date of birth, ID band Patient awake    Reviewed: Allergy & Precautions, NPO status , Patient's Chart, lab work & pertinent test results  Airway Mallampati: II  TM Distance: >3 FB Neck ROM: Full    Dental  (+) Edentulous Upper, Edentulous Lower   Pulmonary shortness of breath and with exertion, sleep apnea , pneumonia, COPD, Current Smoker,  R lung cancer    breath sounds clear to auscultation       Cardiovascular + DOE   Rhythm:Regular Rate:Normal     Neuro/Psych PSYCHIATRIC DISORDERS Anxiety Depression  Neuromuscular disease    GI/Hepatic GERD  ,(+) Hepatitis -  Endo/Other  Hypothyroidism   Renal/GU Renal disease     Musculoskeletal  (+) Fibromyalgia -  Abdominal   Peds  Hematology   Anesthesia Other Findings   Reproductive/Obstetrics                             Anesthesia Physical Anesthesia Plan  ASA: III  Anesthesia Plan: MAC   Post-op Pain Management:    Induction: Intravenous  PONV Risk Score and Plan:   Airway Management Planned: Simple Face Mask  Additional Equipment:   Intra-op Plan:   Post-operative Plan:   Informed Consent: I have reviewed the patients History and Physical, chart, labs and discussed the procedure including the risks, benefits and alternatives for the proposed anesthesia with the patient or authorized representative who has indicated his/her understanding and acceptance.     Plan Discussed with:   Anesthesia Plan Comments:         Anesthesia Quick Evaluation

## 2017-07-06 NOTE — Op Note (Signed)
Operative Note 07/06/17   Preoperative Diagnosis: Lung cancer    Postoperative Diagnosis: Same   Procedure(s) Performed: Port-A-Cath placement, left subclavian vein    Surgeon: Lanell Matar. Constance Haw, MD   Assistants: No qualified resident was available   Anesthesia: Monitored anesthesia care and local anesthestic    Anesthesiologist: Lerry Liner, MD    Specimens: None   Estimated Blood Loss: Minimal   Fluoroscopy time: 31 seconds   Blood Replacement: None    Complications: None    Operative Findings: Normal anatomy   Indications: Sydney Blair is a 61 yo with non small cell lung cancer needing immunotherapy every 2 weeks for an extended period of time. Given this, the oncologist asked for a port to be placed. After a discussion of the risk and benefits, she opted to proceed.  Procedure: The patient was brought into the operating room and monitored anesthesia care was induced.  One percent lidocaine was used for local anesthesia.   The left chest and neck was prepped and draped in the usual sterile fashion.  Preoperative antibiotics were given.  An incision was made below the left clavicle. A subcutaneous pocket was formed. The needles advanced into the left subclavian vein using the Seldinger technique without difficulty. A guidewire was then advanced into the right atrium under fluoroscopic guidance.  Ectopia was not noted. An introducer and peel-away sheath were placed over the guidewire. The catheter was then inserted through the peel-away sheath and the peel-away sheath was removed.  A spot film was performed to confirm the position. The catheter was then attached to the port and the port placed in subcutaneous pocket. Adequate positioning was confirmed by fluoroscopy. Hemostasis was confirmed, and the port was secured with 2-0 prolene sutures.  Good backflow of blood was noted on aspiration of the port. The port was flushed with heparin flush. Subcutaneous layer was reapproximated  using a 3-0 Vicryl interrupted suture. The skin was closed using a 4-0 Vicryl subcuticular suture. Dermabond was applied.  All tape and needle counts were correct at the end of the procedure. The patient was transferred to PACU in stable condition. A chest x-ray will be performed at that time.  Sydney Labrum, MD [facility] [address] [place], [place] [address] [phone] (office)

## 2017-07-06 NOTE — Discharge Instructions (Signed)
Discharge Instructions: Shower per your regular routine. Take tylenol and ibuprofen as needed for pain control, alternating every 4-6 hours.  Take Roxicodone for breakthrough pain. Take colace for constipation related to narcotic pain medication. Do not pick at the dermabond glue on your incision sites.   Implanted [facility] Guide An implanted port is a type of central line that is placed under the skin. Central lines are used to provide IV access when treatment or nutrition needs to be given through a persons veins. Implanted ports are used for long-term IV access. An implanted port may be placed because:  You need IV medicine that would be irritating to the small veins in your hands or arms.  You need long-term IV medicines, such as antibiotics.  You need IV nutrition for a long period.  You need frequent blood draws for lab tests.  You need dialysis.  Implanted ports are usually placed in the chest area, but they can also be placed in the upper arm, the abdomen, or the leg. An implanted port has two main parts:  Reservoir. The reservoir is round and will appear as a small, raised area under your skin. The reservoir is the part where a needle is inserted to give medicines or draw blood.  Catheter. The catheter is a thin, flexible tube that extends from the reservoir. The catheter is placed into a large vein. Medicine that is inserted into the reservoir goes into the catheter and then into the vein.  How will I care for my incision site? Do not get the incision site wet. Bathe or shower as directed by your health care provider. How is my port accessed? Special steps must be taken to access the port:  Before the port is accessed, a numbing cream can be placed on the skin. This helps numb the skin over the port site.  Your health care provider uses a sterile technique to access the port. ? Your health care provider must put on a mask and sterile gloves. ? The skin over your port is  cleaned carefully with an antiseptic and allowed to dry. ? The port is gently pinched between sterile gloves, and a needle is inserted into the port.  Only "non-coring" port needles should be used to access the port. Once the port is accessed, a blood return should be checked. This helps ensure that the port is in the vein and is not clogged.  If your port needs to remain accessed for a constant infusion, a clear (transparent) bandage will be placed over the needle site. The bandage and needle will need to be changed every week, or as directed by your health care provider.  Keep the bandage covering the needle clean and dry. Do not get it wet. Follow your health care providers instructions on how to take a shower or bath while the port is accessed.  If your port does not need to stay accessed, no bandage is needed over the port.  What is flushing? Flushing helps keep the port from getting clogged. Follow your health care providers instructions on how and when to flush the port. Ports are usually flushed with saline solution or a medicine called heparin. The need for flushing will depend on how the port is used.  If the port is used for intermittent medicines or blood draws, the port will need to be flushed: ? After medicines have been given. ? After blood has been drawn. ? As part of routine maintenance.  If a constant infusion  is running, the port may not need to be flushed.  How long will my port stay implanted? The port can stay in for as long as your health care provider thinks it is needed. When it is time for the port to come out, surgery will be done to remove it. The procedure is similar to the one performed when the port was put in. When should I seek immediate medical care? When you have an implanted port, you should seek immediate medical care if:  You notice a bad smell coming from the incision site.  You have swelling, redness, or drainage at the incision site.  You have  more swelling or pain at the port site or the surrounding area.  You have a fever that is not controlled with medicine.  This information is not intended to replace advice given to you by your health care provider. Make sure you discuss any questions you have with your health care provider. Document Released: 08/09/2005 Document Revised: 01/15/2016 Document Reviewed: 04/16/2013 Elsevier Interactive Patient Education  2017 [employer].

## 2017-07-06 NOTE — Interval H&P Note (Signed)
History and Physical Interval Note:  07/06/2017 9:44 AM  Sydney Blair  has presented today for surgery, with the diagnosis of lung cancer  The various methods of treatment have been discussed with the patient and family. After consideration of risks, benefits and other options for treatment, the patient has consented to  Procedure(s): INSERTION PORT-A-CATH (N/A) as a surgical intervention .  The patient's history has been reviewed, patient examined, no change in status, stable for surgery.  I have reviewed the patient's chart and labs.  Questions were answered to the patient's satisfaction.    No major changes. Plans for placement on left if possible.   Virl Cagey

## 2017-07-07 ENCOUNTER — Encounter (HOSPITAL_COMMUNITY): Payer: Self-pay | Admitting: General Surgery

## 2017-07-07 ENCOUNTER — Ambulatory Visit: Payer: Medicaid Other | Admitting: Acute Care

## 2017-07-11 ENCOUNTER — Ambulatory Visit: Payer: Medicaid Other | Admitting: Acute Care

## 2017-07-12 ENCOUNTER — Other Ambulatory Visit: Payer: Self-pay

## 2017-07-12 ENCOUNTER — Encounter (HOSPITAL_BASED_OUTPATIENT_CLINIC_OR_DEPARTMENT_OTHER): Payer: Medicaid Other

## 2017-07-12 ENCOUNTER — Encounter (HOSPITAL_COMMUNITY): Payer: Self-pay

## 2017-07-12 ENCOUNTER — Encounter (HOSPITAL_COMMUNITY): Payer: Medicaid Other | Admitting: Dietician

## 2017-07-12 VITALS — BP 122/54 | HR 77 | Temp 98.2°F | Resp 20 | Wt 150.2 lb

## 2017-07-12 DIAGNOSIS — Z809 Family history of malignant neoplasm, unspecified: Secondary | ICD-10-CM | POA: Diagnosis not present

## 2017-07-12 DIAGNOSIS — M797 Fibromyalgia: Secondary | ICD-10-CM | POA: Diagnosis not present

## 2017-07-12 DIAGNOSIS — G4730 Sleep apnea, unspecified: Secondary | ICD-10-CM | POA: Diagnosis not present

## 2017-07-12 DIAGNOSIS — Z853 Personal history of malignant neoplasm of breast: Secondary | ICD-10-CM | POA: Diagnosis not present

## 2017-07-12 DIAGNOSIS — Z7982 Long term (current) use of aspirin: Secondary | ICD-10-CM | POA: Diagnosis not present

## 2017-07-12 DIAGNOSIS — C3411 Malignant neoplasm of upper lobe, right bronchus or lung: Secondary | ICD-10-CM

## 2017-07-12 DIAGNOSIS — Z9981 Dependence on supplemental oxygen: Secondary | ICD-10-CM | POA: Diagnosis not present

## 2017-07-12 DIAGNOSIS — E039 Hypothyroidism, unspecified: Secondary | ICD-10-CM | POA: Diagnosis not present

## 2017-07-12 DIAGNOSIS — E7800 Pure hypercholesterolemia, unspecified: Secondary | ICD-10-CM | POA: Diagnosis not present

## 2017-07-12 DIAGNOSIS — Z5112 Encounter for antineoplastic immunotherapy: Secondary | ICD-10-CM

## 2017-07-12 DIAGNOSIS — Z825 Family history of asthma and other chronic lower respiratory diseases: Secondary | ICD-10-CM | POA: Diagnosis not present

## 2017-07-12 DIAGNOSIS — Z8249 Family history of ischemic heart disease and other diseases of the circulatory system: Secondary | ICD-10-CM | POA: Diagnosis not present

## 2017-07-12 DIAGNOSIS — R590 Localized enlarged lymph nodes: Secondary | ICD-10-CM | POA: Diagnosis not present

## 2017-07-12 DIAGNOSIS — C3490 Malignant neoplasm of unspecified part of unspecified bronchus or lung: Secondary | ICD-10-CM

## 2017-07-12 DIAGNOSIS — Z79899 Other long term (current) drug therapy: Secondary | ICD-10-CM | POA: Diagnosis not present

## 2017-07-12 DIAGNOSIS — J440 Chronic obstructive pulmonary disease with acute lower respiratory infection: Secondary | ICD-10-CM | POA: Diagnosis not present

## 2017-07-12 DIAGNOSIS — Z880 Allergy status to penicillin: Secondary | ICD-10-CM | POA: Diagnosis not present

## 2017-07-12 DIAGNOSIS — G629 Polyneuropathy, unspecified: Secondary | ICD-10-CM | POA: Diagnosis not present

## 2017-07-12 DIAGNOSIS — K219 Gastro-esophageal reflux disease without esophagitis: Secondary | ICD-10-CM | POA: Diagnosis not present

## 2017-07-12 DIAGNOSIS — Z9889 Other specified postprocedural states: Secondary | ICD-10-CM | POA: Diagnosis not present

## 2017-07-12 DIAGNOSIS — F418 Other specified anxiety disorders: Secondary | ICD-10-CM | POA: Diagnosis not present

## 2017-07-12 DIAGNOSIS — F17210 Nicotine dependence, cigarettes, uncomplicated: Secondary | ICD-10-CM | POA: Diagnosis not present

## 2017-07-12 DIAGNOSIS — C3491 Malignant neoplasm of unspecified part of right bronchus or lung: Secondary | ICD-10-CM

## 2017-07-12 DIAGNOSIS — G8929 Other chronic pain: Secondary | ICD-10-CM | POA: Diagnosis not present

## 2017-07-12 LAB — COMPREHENSIVE METABOLIC PANEL
ALBUMIN: 3.6 g/dL (ref 3.5–5.0)
ALK PHOS: 78 U/L (ref 38–126)
ALT: 6 U/L — ABNORMAL LOW (ref 14–54)
ANION GAP: 7 (ref 5–15)
AST: 12 U/L — AB (ref 15–41)
BILIRUBIN TOTAL: 0.8 mg/dL (ref 0.3–1.2)
CO2: 32 mmol/L (ref 22–32)
Calcium: 8.6 mg/dL — ABNORMAL LOW (ref 8.9–10.3)
Chloride: 97 mmol/L — ABNORMAL LOW (ref 101–111)
Creatinine, Ser: 0.7 mg/dL (ref 0.44–1.00)
GFR calc Af Amer: >60 mL/min (ref >60)
GFR calc non Af Amer: >60 mL/min (ref >60)
GLUCOSE: 71 mg/dL (ref 65–99)
POTASSIUM: 3.2 mmol/L — AB (ref 3.5–5.1)
SODIUM: 136 mmol/L (ref 135–145)
TOTAL PROTEIN: 7.4 g/dL (ref 6.5–8.1)

## 2017-07-12 LAB — CBC WITH DIFFERENTIAL/PLATELET
BASOS ABS: 0 10*3/uL (ref 0.0–0.1)
Basophils Relative: 0 %
EOS PCT: 2 %
Eosinophils Absolute: 0.2 10*3/uL (ref 0.0–0.7)
HEMATOCRIT: 33.4 % — AB (ref 36.0–46.0)
Hemoglobin: 10.3 g/dL — ABNORMAL LOW (ref 12.0–15.0)
LYMPHS PCT: 13 %
Lymphs Abs: 1.2 10*3/uL (ref 0.7–4.0)
MCH: 31.5 pg (ref 26.0–34.0)
MCHC: 30.8 g/dL (ref 30.0–36.0)
MCV: 102.1 fL — AB (ref 78.0–100.0)
MONO ABS: 0.8 10*3/uL (ref 0.1–1.0)
MONOS PCT: 9 %
NEUTROS ABS: 6.8 10*3/uL (ref 1.7–7.7)
Neutrophils Relative %: 76 %
PLATELETS: 263 10*3/uL (ref 150–400)
RBC: 3.27 MIL/uL — ABNORMAL LOW (ref 3.87–5.11)
RDW: 15 % (ref 11.5–15.5)
WBC: 9 10*3/uL (ref 4.0–10.5)

## 2017-07-12 LAB — TSH: TSH: 0.535 u[IU]/mL (ref 0.350–4.500)

## 2017-07-12 MED ORDER — SODIUM CHLORIDE 0.9 % IV SOLN
Freq: Once | INTRAVENOUS | Status: AC
  Administered 2017-07-12: 12:00:00 via INTRAVENOUS

## 2017-07-12 MED ORDER — POTASSIUM CHLORIDE CRYS ER 20 MEQ PO TBCR
EXTENDED_RELEASE_TABLET | ORAL | Status: AC
  Filled 2017-07-12: qty 2

## 2017-07-12 MED ORDER — SODIUM CHLORIDE 0.9% FLUSH
10.0000 mL | INTRAVENOUS | Status: DC | PRN
  Administered 2017-07-12: 10 mL
  Filled 2017-07-12: qty 10

## 2017-07-12 MED ORDER — SODIUM CHLORIDE 0.9 % IV SOLN
10.5000 mg/kg | Freq: Once | INTRAVENOUS | Status: AC
  Administered 2017-07-12: 740 mg via INTRAVENOUS
  Filled 2017-07-12: qty 10

## 2017-07-12 MED ORDER — POTASSIUM CHLORIDE CRYS ER 20 MEQ PO TBCR
40.0000 meq | EXTENDED_RELEASE_TABLET | Freq: Once | ORAL | Status: AC
  Administered 2017-07-12: 40 meq via ORAL

## 2017-07-12 MED ORDER — HEPARIN SOD (PORK) LOCK FLUSH 100 UNIT/ML IV SOLN
500.0000 [IU] | Freq: Once | INTRAVENOUS | Status: AC | PRN
  Administered 2017-07-12: 500 [IU]

## 2017-07-12 NOTE — Progress Notes (Signed)
Nutrition Follow-up:  ASSESSMENT: 61 y/o female PMHx Anxiety, Depression, GERD, COPD (02 dependent), HLD, Ongoing Tobacco Abuse. Diagnosed w/ Lung Cancer Stage III in early July of 2018 s/p chemoradiation ending mid September. Of note, has had 2 hospital admissions since finishing treatment, one of which required intubation. She is now s/p port placement for consolidation chemotherapy (Imfinizi) q 2 weeks.   Pt seen during infusion. She is short spoken and is somewhat difficult to elicit information from. She says she is eating regularly. She denies having any n/v/c/d or uncontrolled pain at this time. She knows she is losing weight, but doesn't know why, "I eat".   Went through diet recall. She says she eats 3 meals a day. The first time she eats is 9:00 am and last meal is 5 pm.   Breakfast: Cold Cereal or oatmeal. Uses Whole milk Lunch: Homemade soup or sandwich Dinner: Highly variable. Something smaller.  Bevs: Diet soda. No supplements.   She has not gotten chemotherapy in about 2 months. Unable to tell how this regimen will affect her. She says the last ones "werent fun".   Medications: Supportive Meds: Compazine, Zofran PRN oxycodone, Imfinzi   Labs: Albumin:3.6, K: 3.2  Recent Labs  Lab 07/12/17 1054  NA 136  K 3.2*  CL 97*  CO2 32  BUN <5*  CREATININE 0.70  CALCIUM 8.6*  GLUCOSE 71   Anthropometrics:  Height: 5' (152.4 cm) Weight: 150 lb 3.2 oz (68.1 kg) BMI: 29.3  Wt Readings from Last 10 Encounters:  07/12/17 150 lb 3.2 oz (68.1 kg)  07/05/17 150 lb (68 kg)  06/28/17 155 lb 11.2 oz (70.6 kg)  06/19/17 154 lb 5.2 oz (70 kg)  06/13/17 154 lb (69.9 kg)  06/06/17 155 lb (70.3 kg)  05/26/17 148 lb (67.1 kg)  05/21/17 156 lb 6.4 oz (70.9 kg)  05/02/17 158 lb 8 oz (71.9 kg)  04/18/17 160 lb (72.6 kg)   Estimated Energy Needs Kcals: 1700-1900 (25-28 kcal/kg bw) Protein: 75-85g Pro  Fluid: 1.7-1.9 L fluid  NUTRITION DIAGNOSIS: Increased protein/calorie  requirements related to cancer and cancer related treatments as evidenced by the nutritional recommendations for this disease state and related therapies  MALNUTRITION DIAGNOSIS: Only meets 1 criteria at this time. At high nutritional risk  INTERVENTION: RD stated that her weight loss is likely attributable to her acute illnesses and likely hypermetabolism. It is also highly likely she is eating less than she believes she is. Regardless of etiology, RD stressed importance of weight maintenance as weight loss is associated with worse outcomes. Losing weight is a sign her body is not receiving adequate substrate for recovery and must cannibalize itself.   Went through her diet recall and made a large number of suggestions. Oatmeal is higher in fiber. Recommended eating high pro/fat items such as cheese toast, pb toast, eggs, bacon, sausage, gravy biscuits etc. Some suggestions for her lunches were adding additional condiments to her sandwiches or adding heavy creams to her soups. Stressed protein/calorie dense foods at dinner. She should consume calorie containing beverages. Feel it is appropriate for her to drink reg soda over diet soda.   For snacks, RD went over the "Big Three" of cheese, Peanut butter and eggs which are some of the most common foods that are high in both kcals and protein.   RD explained concept of never eating a food by itself. She should add toppings, condiments, dressings, creams, syrups etc to her meals. Examples given are adding whipped cream, chocolate  syrup fruit to pancakes, adding cheese, crackers to soup and adding heaving dressings, nuts, cheese, to her salads. These items are very calorie dense and take of very up very little space in the stomach.  She says she does not like supplements but would drink them "if she had to". RD stated that while she doesn't have to, it would likely make maintaing weight easier. RD explained the [employer] assistance program. She was accepting  and she is given her first case today. Additionally is given samples of other products, coupons and handout titled "Making the Most of Each Bite".    MONITORING, EVALUATION, GOAL: Weight, Reported intake  NEXT VISIT: 12/4  Burtis Junes RD, LDN, CNSC Clinical Nutrition Pager: [phone] 07/12/2017 12:17 PM

## 2017-07-12 NOTE — Progress Notes (Signed)
Labs reviewed with MD, will give potassium today as ordered. Proceed with treatment.  Treatment given per orders. Patient tolerated it well without problems. Vitals stable and discharged home from clinic ambulatory. Follow up as scheduled.

## 2017-07-12 NOTE — Patient Instructions (Signed)
[facility] Discharge Instructions for Patients Receiving Chemotherapy   Beginning January 23rd 2017 lab work for the Cancer Center will be done in the  Main lab at [place] on 1st floor. If you have a lab appointment with the Cancer Center please come in thru the  Main Entrance and check in at the main information desk   Today you received the following chemotherapy agents   To help prevent nausea and vomiting after your treatment, we encourage you to take your nausea medication     If you develop nausea and vomiting, or diarrhea that is not controlled by your medication, call the clinic.  The clinic phone number is [phone]. Office hours are Monday-Friday 8:30am-5:00pm.  BELOW ARE SYMPTOMS THAT SHOULD BE REPORTED IMMEDIATELY:  *FEVER GREATER THAN 101.0 F  *CHILLS WITH OR WITHOUT FEVER  NAUSEA AND VOMITING THAT IS NOT CONTROLLED WITH YOUR NAUSEA MEDICATION  *UNUSUAL SHORTNESS OF BREATH  *UNUSUAL BRUISING OR BLEEDING  TENDERNESS IN MOUTH AND THROAT WITH OR WITHOUT PRESENCE OF ULCERS  *URINARY PROBLEMS  *BOWEL PROBLEMS  UNUSUAL RASH Items with * indicate a potential emergency and should be followed up as soon as possible. If you have an emergency after office hours please contact your primary care physician or go to the nearest emergency department.  Please call the clinic during office hours if you have any questions or concerns.   You may also contact the Patient Navigator at [phone] should you have any questions or need assistance in obtaining follow up care.      Resources For Cancer Patients and their Caregivers ? [employer]: Can assist with transportation, wigs, general needs, runs Look Good Feel Better.        1-888-227-6333 ? Cancer Care: Provides financial assistance, online support groups, medication/co-pay assistance.  1-800-813-HOPE (4673) ? [facility] Assists [place] cancer  patients and their families through emotional , educational and financial support.  336-427-4357 ? [place] DSS Where to apply for food stamps, Medicaid and utility assistance. 336-342-1394 ? [facility]: Transportation to medical appointments. 336-347-2287 ? Social Security Administration: May apply for disability if have a Stage IV cancer. 336-342-7796 1-800-772-1213 ? [place] [facility]: Assists with nutrition, care and transit needs. 336-349-2343         

## 2017-07-13 ENCOUNTER — Telehealth (HOSPITAL_COMMUNITY): Payer: Self-pay

## 2017-07-13 NOTE — Telephone Encounter (Signed)
24 hour follow up call-spoke with patient, she stated that she is doing well today, no problems. She denies any issues at this time and told her to call us if she had any concerns or questions.

## 2017-07-18 ENCOUNTER — Encounter: Payer: Medicaid Other | Admitting: Nutrition

## 2017-07-18 ENCOUNTER — Ambulatory Visit: Payer: Medicaid Other

## 2017-07-18 ENCOUNTER — Ambulatory Visit: Payer: Medicaid Other | Admitting: Oncology

## 2017-07-18 ENCOUNTER — Other Ambulatory Visit: Payer: Medicaid Other

## 2017-07-26 ENCOUNTER — Encounter (HOSPITAL_COMMUNITY): Payer: Medicaid Other | Attending: Oncology

## 2017-07-26 ENCOUNTER — Other Ambulatory Visit: Payer: Self-pay

## 2017-07-26 ENCOUNTER — Encounter (HOSPITAL_BASED_OUTPATIENT_CLINIC_OR_DEPARTMENT_OTHER): Payer: Medicaid Other | Admitting: Adult Health

## 2017-07-26 ENCOUNTER — Encounter (HOSPITAL_COMMUNITY): Payer: Self-pay | Admitting: Adult Health

## 2017-07-26 ENCOUNTER — Encounter (HOSPITAL_COMMUNITY): Payer: Medicaid Other | Admitting: Dietician

## 2017-07-26 VITALS — BP 121/54 | HR 81 | Temp 97.9°F | Resp 20 | Wt 149.0 lb

## 2017-07-26 DIAGNOSIS — Z9981 Dependence on supplemental oxygen: Secondary | ICD-10-CM | POA: Diagnosis not present

## 2017-07-26 DIAGNOSIS — Z7982 Long term (current) use of aspirin: Secondary | ICD-10-CM | POA: Diagnosis not present

## 2017-07-26 DIAGNOSIS — E039 Hypothyroidism, unspecified: Secondary | ICD-10-CM | POA: Insufficient documentation

## 2017-07-26 DIAGNOSIS — E7800 Pure hypercholesterolemia, unspecified: Secondary | ICD-10-CM | POA: Diagnosis not present

## 2017-07-26 DIAGNOSIS — C3411 Malignant neoplasm of upper lobe, right bronchus or lung: Secondary | ICD-10-CM

## 2017-07-26 DIAGNOSIS — F17210 Nicotine dependence, cigarettes, uncomplicated: Secondary | ICD-10-CM | POA: Diagnosis not present

## 2017-07-26 DIAGNOSIS — J440 Chronic obstructive pulmonary disease with acute lower respiratory infection: Secondary | ICD-10-CM | POA: Insufficient documentation

## 2017-07-26 DIAGNOSIS — Z8249 Family history of ischemic heart disease and other diseases of the circulatory system: Secondary | ICD-10-CM | POA: Insufficient documentation

## 2017-07-26 DIAGNOSIS — Z880 Allergy status to penicillin: Secondary | ICD-10-CM | POA: Diagnosis not present

## 2017-07-26 DIAGNOSIS — Z9221 Personal history of antineoplastic chemotherapy: Secondary | ICD-10-CM

## 2017-07-26 DIAGNOSIS — F418 Other specified anxiety disorders: Secondary | ICD-10-CM | POA: Diagnosis not present

## 2017-07-26 DIAGNOSIS — G629 Polyneuropathy, unspecified: Secondary | ICD-10-CM | POA: Diagnosis not present

## 2017-07-26 DIAGNOSIS — G8929 Other chronic pain: Secondary | ICD-10-CM | POA: Diagnosis not present

## 2017-07-26 DIAGNOSIS — Z5112 Encounter for antineoplastic immunotherapy: Secondary | ICD-10-CM

## 2017-07-26 DIAGNOSIS — M797 Fibromyalgia: Secondary | ICD-10-CM | POA: Diagnosis not present

## 2017-07-26 DIAGNOSIS — Z923 Personal history of irradiation: Secondary | ICD-10-CM

## 2017-07-26 DIAGNOSIS — Z853 Personal history of malignant neoplasm of breast: Secondary | ICD-10-CM | POA: Diagnosis not present

## 2017-07-26 DIAGNOSIS — Z825 Family history of asthma and other chronic lower respiratory diseases: Secondary | ICD-10-CM | POA: Insufficient documentation

## 2017-07-26 DIAGNOSIS — Z9889 Other specified postprocedural states: Secondary | ICD-10-CM | POA: Diagnosis not present

## 2017-07-26 DIAGNOSIS — Z79899 Other long term (current) drug therapy: Secondary | ICD-10-CM | POA: Diagnosis not present

## 2017-07-26 DIAGNOSIS — R590 Localized enlarged lymph nodes: Secondary | ICD-10-CM | POA: Diagnosis not present

## 2017-07-26 DIAGNOSIS — C3491 Malignant neoplasm of unspecified part of right bronchus or lung: Secondary | ICD-10-CM

## 2017-07-26 DIAGNOSIS — G4730 Sleep apnea, unspecified: Secondary | ICD-10-CM | POA: Insufficient documentation

## 2017-07-26 DIAGNOSIS — Z809 Family history of malignant neoplasm, unspecified: Secondary | ICD-10-CM | POA: Diagnosis not present

## 2017-07-26 DIAGNOSIS — K219 Gastro-esophageal reflux disease without esophagitis: Secondary | ICD-10-CM | POA: Insufficient documentation

## 2017-07-26 DIAGNOSIS — C3490 Malignant neoplasm of unspecified part of unspecified bronchus or lung: Secondary | ICD-10-CM

## 2017-07-26 LAB — CBC WITH DIFFERENTIAL/PLATELET
BASOS PCT: 1 %
Basophils Absolute: 0 10*3/uL (ref 0.0–0.1)
Eosinophils Absolute: 0.5 10*3/uL (ref 0.0–0.7)
Eosinophils Relative: 6 %
HEMATOCRIT: 36.6 % (ref 36.0–46.0)
HEMOGLOBIN: 11.6 g/dL — AB (ref 12.0–15.0)
LYMPHS ABS: 1 10*3/uL (ref 0.7–4.0)
LYMPHS PCT: 13 %
MCH: 30.2 pg (ref 26.0–34.0)
MCHC: 31.7 g/dL (ref 30.0–36.0)
MCV: 95.3 fL (ref 78.0–100.0)
MONOS PCT: 9 %
Monocytes Absolute: 0.7 10*3/uL (ref 0.1–1.0)
NEUTROS ABS: 5.7 10*3/uL (ref 1.7–7.7)
NEUTROS PCT: 71 %
Platelets: 213 10*3/uL (ref 150–400)
RBC: 3.84 MIL/uL — AB (ref 3.87–5.11)
RDW: 13.6 % (ref 11.5–15.5)
WBC: 8 10*3/uL (ref 4.0–10.5)

## 2017-07-26 LAB — COMPREHENSIVE METABOLIC PANEL
ALT: 8 U/L — AB (ref 14–54)
AST: 15 U/L (ref 15–41)
Albumin: 3.7 g/dL (ref 3.5–5.0)
Alkaline Phosphatase: 80 U/L (ref 38–126)
Anion gap: 6 (ref 5–15)
BUN: 7 mg/dL (ref 6–20)
CHLORIDE: 99 mmol/L — AB (ref 101–111)
CO2: 30 mmol/L (ref 22–32)
CREATININE: 0.56 mg/dL (ref 0.44–1.00)
Calcium: 8.9 mg/dL (ref 8.9–10.3)
GFR calc non Af Amer: >60 mL/min (ref >60)
Glucose, Bld: 78 mg/dL (ref 65–99)
POTASSIUM: 3.5 mmol/L (ref 3.5–5.1)
SODIUM: 135 mmol/L (ref 135–145)
Total Bilirubin: 0.6 mg/dL (ref 0.3–1.2)
Total Protein: 7.5 g/dL (ref 6.5–8.1)

## 2017-07-26 MED ORDER — SODIUM CHLORIDE 0.9 % IV SOLN
Freq: Once | INTRAVENOUS | Status: AC
  Administered 2017-07-26: 12:00:00 via INTRAVENOUS

## 2017-07-26 MED ORDER — HEPARIN SOD (PORK) LOCK FLUSH 100 UNIT/ML IV SOLN
500.0000 [IU] | Freq: Once | INTRAVENOUS | Status: AC | PRN
  Administered 2017-07-26: 500 [IU]
  Filled 2017-07-26: qty 5

## 2017-07-26 MED ORDER — DURVALUMAB 500 MG/10ML IV SOLN
10.5000 mg/kg | Freq: Once | INTRAVENOUS | Status: AC
  Administered 2017-07-26: 740 mg via INTRAVENOUS
  Filled 2017-07-26: qty 4.8

## 2017-07-26 MED ORDER — SODIUM CHLORIDE 0.9% FLUSH
10.0000 mL | INTRAVENOUS | Status: DC | PRN
  Administered 2017-07-26: 10 mL
  Filled 2017-07-26: qty 10

## 2017-07-26 NOTE — Progress Notes (Signed)
Sydney Blair tolerated Imfinzi infusion well without complaints or incident.Labs reviewed with Mike Craze NP prior to administering this medication.VSS upon discharge. Pt discharged self ambulatory using cane in satisfactory condition accompanied by family

## 2017-07-26 NOTE — Progress Notes (Signed)
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     Lung mass   12/08/2016 Imaging    CXR IMPRESSION: 5 cm masslike opacity within the right upper lobe -chest CT with contrast is recommended for further evaluation.      02/01/2017 Initial Diagnosis    Lung mass      02/11/2017 Imaging    PET IMPRESSION: 1. Hypermetabolic RIGHT upper lobe mass consists with bronchogenic carcinoma. 2. Hypermetabolic RIGHT hilar and ipsilateral RIGHT paratracheal metastatic adenopathy. 3. No contralateral nodal metastasis or supraclavicular nodal metastasis.      02/28/2017 Surgery    Operation: Flexible video fiberoptic bronchoscopy with endobronchial ultrasound and biopsies.      02/28/2017 Pathology Results    Cytology Diagnosis BRONCHIAL BRUSHING SPECIMEN D, RIGHT UPPER LOBE (SPECIMEN 4 OF 4 COLLECTED 02/28/2017) MALIGNANT CELLS CONSISTENT WITH SQUAMOUS CELL CARCINOMA.         HISTORY OF PRESENT ILLNESS:  (From Dr. Laverle Patter note on 06/28/17)       INTERVAL HISTORY:  Sydney Blair 61 y.o. female returns for routine follow-up for lung cancer. Her concurrent chemoradiation was given in [place] at [facility] under the care of Dr. Julien Nordmann. She transferred her care to [facility] as it is closer to home for her.   Here today unaccompanied. Due for next cycle of Imfinzi.   Overall, she tells me she has been feeling "pretty good."  Appetite 50%; she reports feeling very tired with "no energy."  Remains on 2L O2  via nasal cannula, which is her baseline.    She has chronic complaints of pain to her back/legs/feet, as well as peripheral neuropathy. Both of these issues were present prior to chemo and are largely stable; "they're about the same."  She has intermittent cough with chest wall soreness at times; she has dyspnea on exertion.  "I think some of that is because I'm still getting over the pneumonia I had."  She also has some dizziness with exertion as well (which was also present prior to her previous chemo).    She feels like she tolerated cycle #1 of Imfinzi well; "I didn't get sick or nothing."  Denies any diarrhea or rash.    She has already seen our dietitian today; she is supplementing her diet with Ensure and received a case of product today.      REVIEW OF SYSTEMS:  Review of Systems  Constitutional: Positive for fatigue. Negative for chills and fever.  HENT:   Positive for trouble swallowing.   Eyes: Negative.   Respiratory: Positive for cough and shortness of breath.   Gastrointestinal: Negative.  Negative for abdominal pain, blood in stool, constipation, diarrhea, nausea and vomiting.  Endocrine: Negative.   Genitourinary: Negative.  Negative for dysuria, hematuria and vaginal bleeding.   Musculoskeletal: Positive for arthralgias and back pain.  Skin: Negative.  Negative for rash.  Neurological: Positive for dizziness and numbness.  Hematological: Negative.   Psychiatric/Behavioral: Negative.       PAST MEDICAL/SURGICAL HISTORY:  Past Medical History:  Diagnosis Date  . Anxiety   . ARF (acute renal failure) ([place]) 08/07/2011   ?   Marland Kitchen Arthritis   . Cancer ([place]) 2005   breast-right  . Chronic back pain   . COPD (chronic obstructive pulmonary disease) ([place])   . Depression   . Depression with anxiety   . Dyspnea    on exertion  . Fibromyalgia   . GERD (gastroesophageal reflux disease)   . History of radiation therapy 03/24/17-05/12/17   right lun 2 Gy in 30 fractions  .  Hypercholesteremia   . Hypothyroidism   . Hypothyroidism 06/19/2017  . Incontinence   . Neuropathy   . Peripheral neuropathy   . Sciatic pain   . Sebaceous cyst    lt labial location  . Sleep apnea    uses CPAP  . Stage III squamous cell carcinoma of right lung ([place]) 03/10/2017  . Tobacco abuse    Past Surgical History:  Procedure Laterality Date  . bladder tack    . BREAST BIOPSY Right 12/29/2016   Procedure: RIGHT BREAST BIOPSY;  Surgeon: Aviva Signs, MD;  Location: [facility];  Service: General;  Laterality: Right;  . BREAST LUMPECTOMY     right with node dissection  . COLONOSCOPY  11/11/2010   EAV:[id number] rectal polyp and splenic flexure otherwise normal; pathology showed  tubular adenomas. Next TCS 10/2015.  Marland Kitchen ESOPHAGOGASTRODUODENOSCOPY  11/11/2010   WGN:FAOZH hiatal hernia, 6 French dilator  . ESOPHAGOGASTRODUODENOSCOPY (EGD) WITH ESOPHAGEAL DILATION N/A 06/21/2013   YQM:VHQION esophagus  - status post Maloney dilation s/p bx (benign esophageal bx)  . PORTACATH PLACEMENT Left 07/06/2017   Procedure: INSERTION PORT-A-CATH LEFT SUBCLAVIAN;  Surgeon: Virl Cagey, MD;  Location: [facility];  Service: General;  Laterality: Left;  Marland Kitchen VIDEO BRONCHOSCOPY WITH ENDOBRONCHIAL ULTRASOUND N/A 02/28/2017   Procedure: VIDEO BRONCHOSCOPY WITH ENDOBRONCHIAL ULTRASOUND;  Surgeon: Juanito Doom, MD;  Location: [facility];  Service: Thoracic;  Laterality: N/A;     SOCIAL HISTORY:  Social History   Socioeconomic History  . Marital status: Married    Spouse name: Not on file  . Number of children: 1  . Years of education: Not on file  . Highest education level: Not on file  Social Needs  . Financial resource strain: Not on file  . Food insecurity - worry: Not on file  . Food insecurity - inability: Not on file  . Transportation needs - medical: Not on file  . Transportation needs - non-medical: Not on file  Occupational History  . Occupation: disabled    Fish farm manager: UNEMPLOYED  Tobacco  Use  . Smoking status: Current Every Day Smoker    Packs/day: 1.00    Years: 35.00    Pack years: 35.00    Types: Cigarettes  . Smokeless tobacco: Never Used  . Tobacco comment: has cut back to one pack daily  Substance and Sexual Activity  . Alcohol use: No    Alcohol/week: 0.0 oz  . Drug use: No  . Sexual activity: Yes    Birth control/protection: Post-menopausal    Comment: chemo stopped her periods  Other Topics Concern  . Not on file  Social History Narrative   Lives w. Husband   Lost 1 son-?etiology    FAMILY HISTORY:  Family History  Problem Relation Age of Onset  . Cancer Other   . Heart attack Other   . Lung disease Other   . Emphysema Mother   . Autoimmune disease Neg Hx     CURRENT  MEDICATIONS:  Outpatient Encounter Medications as of 07/26/2017  Medication Sig Note  . acetaminophen (TYLENOL) 500 MG tablet Take 500 mg by mouth every 6 (six) hours as needed for headache.   . albuterol (PROVENTIL HFA;VENTOLIN HFA) 108 (90 BASE) MCG/ACT inhaler Inhale 2 puffs into the lungs every 6 (six) hours as needed for wheezing or shortness of breath.   Marland Kitchen aspirin 81 MG chewable tablet Chew 81 mg by mouth daily.   . budesonide-formoterol (SYMBICORT) 160-4.5 MCG/ACT inhaler Inhale 2 puffs into the lungs daily.    [place] Oris (IMFINZI IV) Inject into the vein. Every 2 weeks   . levalbuterol (XOPENEX) 0.63 MG/3ML nebulizer solution Take 3 mLs (0.63 mg total) by nebulization 3 (three) times daily. 06/19/2017: Machine broke  . levothyroxine (SYNTHROID, LEVOTHROID) 100 MCG tablet Take 100 mcg by mouth daily before breakfast. Reported on 02/03/2016   . lidocaine-prilocaine (EMLA) cream Apply a quarter size amount to affected area 1 hour prior to coming to chemotherapy.   . metoprolol tartrate (LOPRESSOR) 50 MG tablet Take 1 tablet (50 mg total) by mouth 3 (three) times daily.   . ondansetron (ZOFRAN) 8 MG tablet Take 1 tablet (8 mg total) every 8 (eight) hours as needed by mouth for  nausea or vomiting.   Marland Kitchen oxyCODONE (ROXICODONE) 5 MG immediate release tablet Take 1 tablet (5 mg total) every 6 (six) hours as needed by mouth for severe pain or breakthrough pain.   Marland Kitchen prochlorperazine (COMPAZINE) 10 MG tablet Take 1 tablet (10 mg total) every 6 (six) hours as needed by mouth for nausea or vomiting.   . tiotropium (SPIRIVA) 18 MCG inhalation capsule Place 1 capsule (18 mcg total) into inhaler and inhale daily.    No facility-administered encounter medications on file as of 07/26/2017.     ALLERGIES:  Allergies  Allergen Reactions  . Penicillins Anaphylaxis and Other (See Comments)    Has patient had a PCN reaction causing immediate rash, facial/tongue/throat swelling, SOB or lightheadedness with hypotension:Yes Has patient had a PCN reaction causing severe rash involving mucus membranes or skin necrosis:Yes Has patient had a PCN reaction that required hospitalization:No Has patient had a PCN reaction occurring within the last 10 years:No If all of the above answers are "NO", then may proceed with Cephalosporin use.      PHYSICAL EXAM:  ECOG Performance status: 2 - Symptomatic; requires occasional assistance      Physical Exam  Constitutional: She is oriented to person, place, and time.  Chronically-ill appearing female in no acute distress  HENT:  Head: Normocephalic.  Mouth/Throat: Oropharynx is clear and moist.  Eyes: Conjunctivae are normal. No scleral icterus.  Neck: Normal range of motion. Neck supple.  Cardiovascular: Normal rate and regular rhythm.  Pulmonary/Chest: Effort normal. No respiratory distress.  -2L O2 via Lyman in place -Subtle rhonchi appreciated to upper lobes; LLL diminished breath sounds.   Abdominal: Soft. Bowel sounds are normal. There is no tenderness.  Musculoskeletal: Normal range of motion. She exhibits edema (Trace ankle edema bilat).  Lymphadenopathy:    She has no cervical adenopathy.       Right: No supraclavicular adenopathy  present.       Left: No supraclavicular adenopathy present.  Neurological: She is alert and oriented to person, place, and time. No cranial nerve deficit.  Skin: Skin is warm and dry. No rash noted.  Psychiatric: Mood, memory, affect and judgment normal.  Nursing note and vitals reviewed.    LABORATORY DATA:  I have reviewed  the labs as listed.  CBC    Component Value Date/Time   WBC 8.0 07/26/2017 1054   RBC 3.84 (L) 07/26/2017 1054   HGB 11.6 (L) 07/26/2017 1054   HGB 8.9 (L) 06/03/2017 0804   HCT 36.6 07/26/2017 1054   HCT 27.8 (L) 06/03/2017 0804   PLT 213 07/26/2017 1054   PLT 114 (L) 06/03/2017 0804   MCV 95.3 07/26/2017 1054   MCV 99.3 06/03/2017 0804   MCH 30.2 07/26/2017 1054   MCHC 31.7 07/26/2017 1054   RDW 13.6 07/26/2017 1054   RDW 23.7 (H) 06/03/2017 0804   LYMPHSABS 1.0 07/26/2017 1054   LYMPHSABS 0.9 06/03/2017 0804   MONOABS 0.7 07/26/2017 1054   MONOABS 0.3 06/03/2017 0804   EOSABS 0.5 07/26/2017 1054   EOSABS 0.2 06/03/2017 0804   BASOSABS 0.0 07/26/2017 1054   BASOSABS 0.0 06/03/2017 0804   CMP Latest Ref Rng & Units 07/26/2017 07/12/2017 06/20/2017  Glucose 65 - 99 mg/dL 78 71 105(H)  BUN 6 - 20 mg/dL 7 <5(L) 9  Creatinine 0.44 - 1.00 mg/dL 0.56 0.70 0.53  Sodium 135 - 145 mmol/L 135 136 140  Potassium 3.5 - 5.1 mmol/L 3.5 3.2(L) 3.7  Chloride 101 - 111 mmol/L 99(L) 97(L) 104  CO2 22 - 32 mmol/L 30 32 28  Calcium 8.9 - 10.3 mg/dL 8.9 8.6(L) 8.8(L)  Total Protein 6.5 - 8.1 g/dL 7.5 7.4 -  Total Bilirubin 0.3 - 1.2 mg/dL 0.6 0.8 -  Alkaline Phos 38 - 126 U/L 80 78 -  AST 15 - 41 U/L 15 12(L) -  ALT 14 - 54 U/L 8(L) 6(L) -    PENDING LABS:    DIAGNOSTIC IMAGING:  *The following radiologic images and reports have been reviewed independently and agree with below findings.  CTA chest: 06/19/17 CLINICAL DATA:  Hemoptysis.  History of lung and breast cancer.  EXAM: CT ANGIOGRAPHY CHEST WITH CONTRAST  TECHNIQUE: Multidetector CT imaging of  the chest was performed using the standard protocol during bolus administration of intravenous contrast. Multiplanar CT image reconstructions and MIPs were obtained to evaluate the vascular anatomy.  CONTRAST:  100 cc Isovue 370  COMPARISON:  Chest radiograph June 18, 2017 at 2358 hours and PET CT February 11, 2017  FINDINGS: CARDIOVASCULAR: Adequate contrast opacification of the pulmonary artery's. Main pulmonary artery is not enlarged. No pulmonary arterial filling defects to the level of the subsegmental branches. Heart size is normal, no right heart strain. No pericardial effusion. Thoracic aorta is normal course and caliber, mild calcific atherosclerosis.  MEDIASTINUM/NODES: Scattered calcified subcentimeter lymph nodes. Decreased size of RIGHT hilar and anterior mediastinal lymphadenopathy.  LUNGS/PLEURA: Necrotic, cavitated spiculated 3.8 x 4.9 cm RIGHT upper lobe mass was 4.6 x 5.5 cm. Mass encases the RIGHT upper lobe pulmonary artery which remains patent. Narrowed RIGHT upper lobe bronchus. Diffuse bronchial wall thickening. Focal RIGHT lower lobe bronchiectasis and mucoid impaction. Biapical bullous changes in RIGHT pleural thickening. Tree-in-bud infiltrates bilateral upper lobes, to lesser extent RIGHT middle RIGHT lower lobe. Scattered punctate calcified granulomas. Mild centrilobular emphysema. No pleural effusion.  UPPER ABDOMEN: Unchanged LEFT hydro nephrosis and bilateral extrarenal pelvises. No adrenal mass.  MUSCULOSKELETAL: Visualized soft tissues and included osseous structures are nonacute . Skin thickening coarse calcifications RIGHT breast are improved. Old RIGHT posterior rib fractures. Surgical clips RIGHT axilla consistent with nodal dissection. Moderate canal stenosis C6-7 due to large disc osteophyte complex.  Review of the MIP images confirms the above findings.  IMPRESSION: 1. No acute  pulmonary embolism. 2. Scattered tree-in-bud  infiltrates may be infectious or inflammatory, lymphangitic spread of tumor not excluded. Diffuse bronchial wall thickening. Low 3. Smaller RIGHT upper lobe carcinoma, now necrotic/cavitated. Decreased size of nodal metastasis. 4. Chronic LEFT hydronephrosis.  Aortic Atherosclerosis (ICD10-I70.0) and Emphysema (ICD10-J43.9).   Electronically Signed   By: Elon Alas M.D.   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 Treated with concurrent chemoradiation with weekly Carbo/Taxol x 6 cycles; last dose of chemo 05/02/17.  Post-treatment CTA chest on 06/19/17 showed smaller RUL carcinoma, now necrotic/cavitated, also with decreased size of nodal mets.  PD-L1 negative (0%).  Transferred her care to [facility] in 06/2017.  Port-a-cath was placed on 07/06/17 by Dr. Constance Haw in [place].  Consolidation therapy with Imfinzi started 07/12/17.  -Here for cycle #3 Imfinzi today. Labs reviewed and are adequate for treatment. She has tolerated therapy well thus far. Her fatigue and shortness of breath appear to be at her baseline and are no worse per her report. No rash or diarrhea reported. Reviewed common side effects of immunotherapy with her and encouraged her to contact us with any new/concerning/worsening symptoms.  Will keep monitoring for adverse side effects.   -Restaging CT chest will be due in late 08/2017; will place orders at subsequent follow-up visit.  -Continue Imfinzi every 2 weeks for total of 1 year of therapy.  -Return to cancer center in 4 weeks for follow-up visit.  Will order next CT chest for continued surveillance at that time.    Nutrition:  -Reinforced the importance of adequate nutrition as she continues treatment for lung cancer. Encouraged her to continue to supplement her diet with Ensure/Boost as tolerated. She  was also seen by one of our dietitians, Burtis Junes, RD today. Please see his documentation for additional details.   At risk for endocrinopathies:  -Baseline TSH is normal. Last TSH 0.535 on 07/12/17. Will continue to periodically monitor for thyroid dysfunction and other possible endocrinopathies while on immunotherapy.   History of right breast cancer:  -Diagnosed in 2005. Treated with lumpectomy, radiation, and chemotherapy per documentation.  -Last diagnostic (R) breast mammogram and ultrasound performed on 10/21/16 showed concerns for hypoechoic mass-like shadowing and peau d'orange with firmness on physical exam by radiologist.  -Underwent (R) breast biopsy at 2:00 position on 11/02/16 with pathology showing benign breast tissue with fat necrosis; no evidence of malignancy.  -Additional (R) breast biopsy performed by Dr. Arnoldo Morale on 12/29/16 again with no malignancy identified on pathology.  -She will need clinical breast exam at her next follow-up visit.   -Annual screening mammogram will be due in 10/2017; will place orders at next visit.       Dispo:  -Continue Imfinzi every 2 weeks.  -Return to cancer center in 4 weeks for follow-up visit with subsequent treatment.    All questions were answered to patient's stated satisfaction. Encouraged patient to call with any new concerns or questions before her next visit to the cancer center and we can certain see her sooner, if needed.    Plan of care discussed with Dr. Talbert Cage, who agrees with the above aforementioned.    Orders placed this encounter:  No orders of the defined types were placed in this encounter.     Mike Craze, NP [facility] [phone]

## 2017-07-26 NOTE — Patient Instructions (Signed)
[facility] Discharge Instructions for Patients Receiving Chemotherapy   Beginning January 23rd 2017 lab work for the [facility] will be done in the  Main lab at [facility] on 1st floor. If you have a lab appointment with the [facility] please come in thru the  Main Entrance and check in at the main information desk   Today you received the following chemotherapy agents Imfinzi. Follow-up as scheduled. Call clinic for any questions or concerns  To help prevent nausea and vomiting after your treatment, we encourage you to take your nausea medication   If you develop nausea and vomiting, or diarrhea that is not controlled by your medication, call the clinic.  The clinic phone number is [phone]. Office hours are Monday-Friday 8:30am-5:00pm.  BELOW ARE SYMPTOMS THAT SHOULD BE REPORTED IMMEDIATELY:  *FEVER GREATER THAN 101.0 F  *CHILLS WITH OR WITHOUT FEVER  NAUSEA AND VOMITING THAT IS NOT CONTROLLED WITH YOUR NAUSEA MEDICATION  *UNUSUAL SHORTNESS OF BREATH  *UNUSUAL BRUISING OR BLEEDING  TENDERNESS IN MOUTH AND THROAT WITH OR WITHOUT PRESENCE OF ULCERS  *URINARY PROBLEMS  *BOWEL PROBLEMS  UNUSUAL RASH Items with * indicate a potential emergency and should be followed up as soon as possible. If you have an emergency after office hours please contact your primary care physician or go to the nearest emergency department.  Please call the clinic during office hours if you have any questions or concerns.   You may also contact the Patient Navigator at [phone] should you have any questions or need assistance in obtaining follow up care.      Resources For Cancer Patients and their Caregivers ? American Cancer Society: Can assist with transportation, wigs, general needs, runs Look Good Feel Better.        769 235 8261 ? Cancer Care: Provides financial assistance, online support groups, medication/co-pay assistance.  1-800-813-HOPE  305-562-7817) ? [facility] Assists [place] cancer patients and their families through emotional , educational and financial support.  6146714500 ? [place] DSS Where to apply for food stamps, Medicaid and utility assistance. 651-561-3409 ? [facility]: Transportation to medical appointments. (647) 596-9177 ? Social Security Administration: May apply for disability if have a Stage IV cancer. 408-012-7571 231-377-2845 ? [employer], Disability and Transit Services: Assists with nutrition, care and transit needs. 587-873-2637

## 2017-07-26 NOTE — Progress Notes (Signed)
Nutrition Follow-up:  ASSESSMENT: 61 y/o female PMHx Anxiety, Depression, GERD, COPD (02 dependent), HLD, Ongoing Tobacco Abuse. Diagnosed w/ Lung Cancer Stage III in early July of 2018 s/p chemoradiation ending mid September. Of note, has had 2 hospital admissions since finishing treatment, one of which required intubation. She is now s/p port placement for consolidation chemotherapy (Imfinizi) q 2 weeks.   Pt seen as follow up during infusion. She was seen two weeks ago. She says she tolerated her initial chemotherapy extremely well and denies any side affects whatsoever. She says she has been drinking ~4 ensures/day as well as eating 3-4 small meals per day. She says she started eating at night too as RD had recommended last visit. She is noted to be down 1 lb which frustrates her.   Her food choices are the same.  Breakfast: Cold Cereal or oatmeal. Uses Whole milk Lunch: Homemade soup or sandwich Dinner: Highly variable. Something smaller.  HS snack candy bar.  Bevs: Diet soda.   Medications: Supportive Meds: Compazine, Zofran PRN oxycodone, Imfinzi   Labs: Albumin:3.7, K: 3.5  Recent Labs  Lab 07/26/17 1054  NA 135  K 3.5  CL 99*  CO2 30  BUN 7  CREATININE 0.56  CALCIUM 8.9  GLUCOSE 78    Anthropometrics:  Height: 5' (152.4 cm) Weight: 149 lbs (67.6kg) BMI: 29.1  Wt Readings from Last 10 Encounters:  07/26/17 149 lb (67.6 kg)  07/12/17 150 lb 3.2 oz (68.1 kg)  07/05/17 150 lb (68 kg)  06/28/17 155 lb 11.2 oz (70.6 kg)  06/19/17 154 lb 5.2 oz (70 kg)  06/13/17 154 lb (69.9 kg)  06/06/17 155 lb (70.3 kg)  05/26/17 148 lb (67.1 kg)  05/21/17 156 lb 6.4 oz (70.9 kg)  05/02/17 158 lb 8 oz (71.9 kg)    Estimated Energy Needs Kcals: 1700-1900 (25-28 kcal/kg bw) Protein: 75-85g Pro  Fluid: 1.7-1.9 L fluid  NUTRITION DIAGNOSIS: Increased protein/calorie requirements related to cancer and cancer related treatments as evidenced by the nutritional recommendations  for this disease state and related therapies  MALNUTRITION DIAGNOSIS: Only meets 1 criteria at this time. At high nutritional risk  INTERVENTION: RD stated encouraged that a 1 lb variance can be related to many factors other than her not eating enough, including hydration, last BM, scale error, items in pockets etc. Asked her not to read to much into the single lb.   She sounds to have greatly increased her kcal intake as she is not consumes 4 supplements/ady as well as eating a midnight snack. She says she is adding "extras" to her meals such as mayo. She is still drinking diet soda though.   RD again recommended switching to kcal containing beverages such as milk, sweet tea, juice, reg soda etc etc.  Pt accepted her second Ensure case today. Additionally is given samples of other products,   Based on her report, there is little more RD can offer at this time. Will monitor weight and follow up as needed.    MONITORING, EVALUATION, GOAL: Weight, Reported intake  NEXT VISIT: As needed.  Burtis Junes RD, LDN, CNSC Clinical Nutrition Pager: [phone] 07/26/2017 11:25 AM

## 2017-08-01 ENCOUNTER — Other Ambulatory Visit: Payer: Medicaid Other

## 2017-08-01 ENCOUNTER — Ambulatory Visit: Payer: Medicaid Other | Admitting: Internal Medicine

## 2017-08-01 ENCOUNTER — Ambulatory Visit: Payer: Medicaid Other

## 2017-08-09 ENCOUNTER — Encounter (HOSPITAL_BASED_OUTPATIENT_CLINIC_OR_DEPARTMENT_OTHER): Payer: Medicaid Other

## 2017-08-09 ENCOUNTER — Encounter (HOSPITAL_COMMUNITY): Payer: Self-pay

## 2017-08-09 ENCOUNTER — Other Ambulatory Visit: Payer: Self-pay

## 2017-08-09 VITALS — BP 111/66 | HR 84 | Temp 97.4°F | Resp 20 | Wt 148.6 lb

## 2017-08-09 DIAGNOSIS — C3490 Malignant neoplasm of unspecified part of unspecified bronchus or lung: Secondary | ICD-10-CM

## 2017-08-09 DIAGNOSIS — C3411 Malignant neoplasm of upper lobe, right bronchus or lung: Secondary | ICD-10-CM | POA: Diagnosis not present

## 2017-08-09 DIAGNOSIS — Z5112 Encounter for antineoplastic immunotherapy: Secondary | ICD-10-CM | POA: Diagnosis present

## 2017-08-09 DIAGNOSIS — C3491 Malignant neoplasm of unspecified part of right bronchus or lung: Secondary | ICD-10-CM

## 2017-08-09 LAB — CBC WITH DIFFERENTIAL/PLATELET
Basophils Absolute: 0 10*3/uL (ref 0.0–0.1)
Basophils Relative: 0 %
EOS ABS: 0.5 10*3/uL (ref 0.0–0.7)
EOS PCT: 5 %
HCT: 36.6 % (ref 36.0–46.0)
Hemoglobin: 11.5 g/dL — ABNORMAL LOW (ref 12.0–15.0)
LYMPHS ABS: 1.1 10*3/uL (ref 0.7–4.0)
Lymphocytes Relative: 12 %
MCH: 29.2 pg (ref 26.0–34.0)
MCHC: 31.4 g/dL (ref 30.0–36.0)
MCV: 92.9 fL (ref 78.0–100.0)
MONO ABS: 0.7 10*3/uL (ref 0.1–1.0)
Monocytes Relative: 7 %
Neutro Abs: 7.5 10*3/uL (ref 1.7–7.7)
Neutrophils Relative %: 76 %
PLATELETS: 216 10*3/uL (ref 150–400)
RBC: 3.94 MIL/uL (ref 3.87–5.11)
RDW: 14.1 % (ref 11.5–15.5)
WBC: 9.8 10*3/uL (ref 4.0–10.5)

## 2017-08-09 LAB — COMPREHENSIVE METABOLIC PANEL
ALBUMIN: 3.4 g/dL — AB (ref 3.5–5.0)
ALT: 7 U/L — ABNORMAL LOW (ref 14–54)
AST: 11 U/L — AB (ref 15–41)
Alkaline Phosphatase: 81 U/L (ref 38–126)
Anion gap: 11 (ref 5–15)
BILIRUBIN TOTAL: 0.7 mg/dL (ref 0.3–1.2)
BUN: 7 mg/dL (ref 6–20)
CHLORIDE: 95 mmol/L — AB (ref 101–111)
CO2: 27 mmol/L (ref 22–32)
CREATININE: 0.57 mg/dL (ref 0.44–1.00)
Calcium: 8.7 mg/dL — ABNORMAL LOW (ref 8.9–10.3)
GFR calc Af Amer: >60 mL/min (ref >60)
GLUCOSE: 86 mg/dL (ref 65–99)
Potassium: 3.7 mmol/L (ref 3.5–5.1)
Sodium: 133 mmol/L — ABNORMAL LOW (ref 135–145)
Total Protein: 7.1 g/dL (ref 6.5–8.1)

## 2017-08-09 MED ORDER — SODIUM CHLORIDE 0.9 % IV SOLN
Freq: Once | INTRAVENOUS | Status: AC
  Administered 2017-08-09: 12:00:00 via INTRAVENOUS

## 2017-08-09 MED ORDER — LIDOCAINE-PRILOCAINE 2.5-2.5 % EX CREA: TOPICAL_CREAM | CUTANEOUS | 2 refills | Status: DC

## 2017-08-09 MED ORDER — HEPARIN SOD (PORK) LOCK FLUSH 100 UNIT/ML IV SOLN
500.0000 [IU] | Freq: Once | INTRAVENOUS | Status: AC | PRN
  Administered 2017-08-09: 500 [IU]

## 2017-08-09 MED ORDER — SODIUM CHLORIDE 0.9 % IV SOLN
10.0000 mg/kg | Freq: Once | INTRAVENOUS | Status: AC
  Administered 2017-08-09: 700 mg via INTRAVENOUS
  Filled 2017-08-09: qty 4

## 2017-08-09 MED ORDER — SODIUM CHLORIDE 0.9% FLUSH
10.0000 mL | INTRAVENOUS | Status: DC | PRN
Start: 2017-08-09 — End: 2017-08-09
  Administered 2017-08-09: 10 mL
  Filled 2017-08-09: qty 10

## 2017-08-09 NOTE — Progress Notes (Signed)
Labs reviewed with Dr. Talbert Cage and ok to treat today.

## 2017-08-09 NOTE — Patient Instructions (Signed)
[facility] Discharge Instructions for Patients Receiving Chemotherapy   Beginning January 23rd 2017 lab work for the Cancer Center will be done in the  Main lab at  on 1st floor. If you have a lab appointment with the Cancer Center please come in thru the  Main Entrance and check in at the main information desk   Today you received the following chemotherapy agents   To help prevent nausea and vomiting after your treatment, we encourage you to take your nausea medication     If you develop nausea and vomiting, or diarrhea that is not controlled by your medication, call the clinic.  The clinic phone number is [phone]. Office hours are Monday-Friday 8:30am-5:00pm.  BELOW ARE SYMPTOMS THAT SHOULD BE REPORTED IMMEDIATELY:  *FEVER GREATER THAN 101.0 F  *CHILLS WITH OR WITHOUT FEVER  NAUSEA AND VOMITING THAT IS NOT CONTROLLED WITH YOUR NAUSEA MEDICATION  *UNUSUAL SHORTNESS OF BREATH  *UNUSUAL BRUISING OR BLEEDING  TENDERNESS IN MOUTH AND THROAT WITH OR WITHOUT PRESENCE OF ULCERS  *URINARY PROBLEMS  *BOWEL PROBLEMS  UNUSUAL RASH Items with * indicate a potential emergency and should be followed up as soon as possible. If you have an emergency after office hours please contact your primary care physician or go to the nearest emergency department.  Please call the clinic during office hours if you have any questions or concerns.   You may also contact the Patient Navigator at [phone] should you have any questions or need assistance in obtaining follow up care.      Resources For Cancer Patients and their Caregivers ? American Cancer Society: Can assist with transportation, wigs, general needs, runs Look Good Feel Better.        1-888-227-6333 ? Cancer Care: Provides financial assistance, online support groups, medication/co-pay assistance.  1-800-813-HOPE (4673) ? [facility] Assists [place] cancer  patients and their families through emotional , educational and financial support.  336-427-4357 ? [place] DSS Where to apply for food stamps, Medicaid and utility assistance. 336-342-1394 ? [facility]: Transportation to medical appointments. 336-347-2287 ? Social Security Administration: May apply for disability if have a Stage IV cancer. 336-342-7796 1-800-772-1213 ? [place] [facility], Dis[facility] Transit [facility]: Assists with nutrition, care and transit needs. 336-349-2343         

## 2017-08-09 NOTE — Progress Notes (Signed)
Treatment given per orders. Patient tolerated it well without problems. Vitals stable and discharged home from clinic ambulatory. Follow up as scheduled.  

## 2017-08-10 NOTE — Progress Notes (Deleted)
Cardiology Office Note   Date:  08/10/2017   ID:  Baeleigh, Devincent 1955/10/13, MRN [id number]  PCP:  [name], MD  Cardiologist:   No primary care provider on file. Referring:  ***  No chief complaint on file.     History of Present Illness: Sydney Blair is a 61 y.o. female who presents for follow up of elevated troponin.  She was admitted in Sept with chest pain, diarrhea and vomiting.     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 Once admitted she developed restlessness and disorientation progressing to obtundation and she was intubated and treated for pneumonia.  During that time she had an elevated troponin.   Echo was normal.   This was suspected to be possible demand ischemia.   She was in the hospital again following this again with pneumonia .  ***    Past Medical History:  Diagnosis Date  . Anxiety   . ARF (acute renal failure) ([place]) 08/07/2011   ?   Marland Kitchen Arthritis   . Cancer ([place]) 2005   breast-right  . Chronic back pain   . COPD (chronic obstructive pulmonary disease) ([place])   . Depression   . Depression with anxiety   . Dyspnea    on exertion  . Fibromyalgia   . GERD (gastroesophageal reflux disease)   . History of radiation therapy 03/24/17-05/12/17   right lun 2 Gy in 30 fractions  . Hypercholesteremia   . Hypothyroidism   . Hypothyroidism 06/19/2017  . Incontinence   . Neuropathy   . Peripheral neuropathy   . Sciatic pain   . Sebaceous cyst    lt labial location  . Sleep apnea    uses CPAP  . 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 Tobacco abuse     Past Surgical History:  Procedure Laterality Date  . bladder tack    . BREAST BIOPSY Right 12/29/2016   Procedure: RIGHT BREAST BIOPSY;  Surgeon: Aviva Signs, MD;  Location: [facility];  Service: General;  Laterality: Right;  . BREAST LUMPECTOMY     right  with node dissection  . COLONOSCOPY  11/11/2010   [id number]:[id number] rectal polyp and splenic flexure otherwise normal; pathology showed  tubular adenomas. Next TCS 10/2015.  Marland Kitchen ESOPHAGOGASTRODUODENOSCOPY  11/11/2010   IEP:PIRJJ hiatal hernia, 47 French dilator  . ESOPHAGOGASTRODUODENOSCOPY (EGD) WITH ESOPHAGEAL DILATION N/A 06/21/2013   OAC:ZYSAYT esophagus  - status post Maloney dilation s/p bx (benign esophageal bx)  . PORTACATH PLACEMENT Left 07/06/2017   Procedure: INSERTION PORT-A-CATH LEFT SUBCLAVIAN;  Surgeon: Virl Cagey, MD;  Location: [facility];  Service: General;  Laterality: Left;  Marland Kitchen VIDEO BRONCHOSCOPY WITH ENDOBRONCHIAL ULTRASOUND N/A 02/28/2017   Procedure: VIDEO BRONCHOSCOPY WITH ENDOBRONCHIAL ULTRASOUND;  Surgeon: Juanito Doom, MD;  Location: [facility];  Service: Thoracic;  Laterality: N/A;     Current Outpatient Medications  Medication Sig Dispense Refill  . acetaminophen (TYLENOL) 500 MG tablet Take 500 mg by mouth every 6 (six) hours as needed for headache.    . albuterol (PROVENTIL HFA;VENTOLIN HFA) 108 (90 BASE) MCG/ACT inhaler Inhale 2 puffs into the lungs every 6 (six) hours as needed for wheezing or shortness of breath.    Marland Kitchen aspirin 81 MG chewable tablet Chew 81 mg by mouth daily.    . budesonide-formoterol (SYMBICORT) 160-4.5 MCG/ACT inhaler Inhale 2 puffs into the  lungs daily.     Hunt Oris (IMFINZI IV) Inject into the vein. Every 2 weeks    . levalbuterol (XOPENEX) 0.63 MG/3ML nebulizer solution Take 3 mLs (0.63 mg total) by nebulization 3 (three) times daily. 3 mL 12  . levothyroxine (SYNTHROID, LEVOTHROID) 100 MCG tablet Take 100 mcg by mouth daily before breakfast. Reported on 02/03/2016    . lidocaine-prilocaine (EMLA) cream Apply a quarter size amount to affected area 1 hour prior to coming to chemotherapy. 30 g 2  . metoprolol tartrate (LOPRESSOR) 50 MG tablet Take 1 tablet (50 mg total) by mouth 3 (three) times daily. 90 tablet 0  . ondansetron (ZOFRAN) 8 MG  tablet Take 1 tablet (8 mg total) every 8 (eight) hours as needed by mouth for nausea or vomiting. 30 tablet 2  . oxyCODONE (ROXICODONE) 5 MG immediate release tablet Take 1 tablet (5 mg total) every 6 (six) hours as needed by mouth for severe pain or breakthrough pain. 10 tablet 0  . prochlorperazine (COMPAZINE) 10 MG tablet Take 1 tablet (10 mg total) every 6 (six) hours as needed by mouth for nausea or vomiting. 30 tablet 2  . tiotropium (SPIRIVA) 18 MCG inhalation capsule Place 1 capsule (18 mcg total) into inhaler and inhale daily. 30 capsule 6   No current facility-administered medications for this visit.     Allergies:   Penicillins    ROS:  Please see the history of present illness.   Otherwise, review of systems are positive for {NONE DEFAULTED:18576::"none"}.   All other systems are reviewed and negative.    PHYSICAL EXAM: VS:  There were no vitals taken for this visit. , BMI There is no height or weight on file to calculate BMI. GENERAL:  Well appearing NECK:  No jugular venous distention, waveform within normal limits, carotid upstroke brisk and symmetric, no bruits, no thyromegaly LUNGS:  Clear to auscultation bilaterally BACK:  No CVA tenderness CHEST:  Unremarkable HEART:  PMI not displaced or sustained,S1 and S2 within normal limits, no S3, no S4, no clicks, no rubs, *** murmurs ABD:  Flat, positive bowel sounds normal in frequency in pitch, no bruits, no rebound, no guarding, no midline pulsatile mass, no hepatomegaly, no splenomegaly EXT:  2 plus pulses throughout, no edema, no cyanosis no clubbing    EKG:  EKG {ACTION; IS/IS ZHY:[id number]} ordered today. The ekg ordered today demonstrates ***   Recent Labs: 06/20/2017: Magnesium 2.0 07/12/2017: TSH 0.535 08/09/2017: ALT 7; BUN 7; Creatinine, Ser 0.57; Hemoglobin 11.5; Platelets 216; Potassium 3.7; Sodium 133    Lipid Panel    Component Value Date/Time   CHOL 220 (H) 02/26/2008 2217   TRIG 203 (H) 05/17/2017  0020   HDL 41 02/26/2008 2217   CHOLHDL 5.4 Ratio 02/26/2008 2217   VLDL 55 (H) 02/26/2008 2217   LDLCALC 124 (H) 02/26/2008 2217      Wt Readings from Last 3 Encounters:  08/09/17 148 lb 9.6 oz (67.4 kg)  07/26/17 149 lb (67.6 kg)  07/12/17 150 lb 3.2 oz (68.1 kg)      Other studies Reviewed: Additional studies/ records that were reviewed today include: ***. Review of the above records demonstrates:  Please see elsewhere in the note.  ***   ASSESSMENT AND PLAN:  *** ELEVATED TROPONIN:  ***    Current medicines are reviewed at length with the patient today.  The patient {ACTIONS; HAS/DOES NOT HAVE:19233} concerns regarding medicines.  The following changes have been made:  {PLAN; NO CHANGE:13088:s}  Labs/ tests ordered today include: *** No orders of the defined types were placed in this encounter.    Disposition:   FU with ***    Signed, Minus Breeding, MD  08/10/2017 8:58 PM    [facility]

## 2017-08-12 ENCOUNTER — Ambulatory Visit: Payer: Medicaid Other | Admitting: Cardiology

## 2017-08-17 ENCOUNTER — Other Ambulatory Visit: Payer: Self-pay | Admitting: Pulmonary Disease

## 2017-08-25 ENCOUNTER — Encounter (HOSPITAL_COMMUNITY): Payer: Self-pay | Admitting: Oncology

## 2017-08-25 ENCOUNTER — Inpatient Hospital Stay (HOSPITAL_BASED_OUTPATIENT_CLINIC_OR_DEPARTMENT_OTHER): Payer: Medicaid Other

## 2017-08-25 ENCOUNTER — Inpatient Hospital Stay (HOSPITAL_COMMUNITY): Payer: Medicaid Other | Attending: Oncology | Admitting: Oncology

## 2017-08-25 ENCOUNTER — Other Ambulatory Visit: Payer: Self-pay

## 2017-08-25 ENCOUNTER — Encounter (HOSPITAL_COMMUNITY): Payer: Self-pay

## 2017-08-25 VITALS — BP 119/57 | HR 76 | Temp 97.8°F | Resp 18 | Wt 146.8 lb

## 2017-08-25 DIAGNOSIS — Z853 Personal history of malignant neoplasm of breast: Secondary | ICD-10-CM | POA: Diagnosis not present

## 2017-08-25 DIAGNOSIS — Z720 Tobacco use: Secondary | ICD-10-CM

## 2017-08-25 DIAGNOSIS — F17210 Nicotine dependence, cigarettes, uncomplicated: Secondary | ICD-10-CM | POA: Insufficient documentation

## 2017-08-25 DIAGNOSIS — C3491 Malignant neoplasm of unspecified part of right bronchus or lung: Secondary | ICD-10-CM

## 2017-08-25 DIAGNOSIS — C3490 Malignant neoplasm of unspecified part of unspecified bronchus or lung: Secondary | ICD-10-CM

## 2017-08-25 DIAGNOSIS — G893 Neoplasm related pain (acute) (chronic): Secondary | ICD-10-CM

## 2017-08-25 DIAGNOSIS — Z5112 Encounter for antineoplastic immunotherapy: Secondary | ICD-10-CM

## 2017-08-25 DIAGNOSIS — N6314 Unspecified lump in the right breast, lower inner quadrant: Secondary | ICD-10-CM | POA: Diagnosis not present

## 2017-08-25 DIAGNOSIS — G620 Drug-induced polyneuropathy: Secondary | ICD-10-CM | POA: Diagnosis not present

## 2017-08-25 DIAGNOSIS — C3411 Malignant neoplasm of upper lobe, right bronchus or lung: Secondary | ICD-10-CM

## 2017-08-25 DIAGNOSIS — J069 Acute upper respiratory infection, unspecified: Secondary | ICD-10-CM | POA: Insufficient documentation

## 2017-08-25 DIAGNOSIS — L299 Pruritus, unspecified: Secondary | ICD-10-CM | POA: Diagnosis not present

## 2017-08-25 DIAGNOSIS — E876 Hypokalemia: Secondary | ICD-10-CM | POA: Insufficient documentation

## 2017-08-25 LAB — CBC WITH DIFFERENTIAL/PLATELET
BASOS ABS: 0.1 10*3/uL (ref 0.0–0.1)
BASOS PCT: 1 %
Eosinophils Absolute: 0.4 10*3/uL (ref 0.0–0.7)
Eosinophils Relative: 4 %
HEMATOCRIT: 40.2 % (ref 36.0–46.0)
HEMOGLOBIN: 12.3 g/dL (ref 12.0–15.0)
Lymphocytes Relative: 12 %
Lymphs Abs: 1.2 10*3/uL (ref 0.7–4.0)
MCH: 27.8 pg (ref 26.0–34.0)
MCHC: 30.6 g/dL (ref 30.0–36.0)
MCV: 90.7 fL (ref 78.0–100.0)
Monocytes Absolute: 0.5 10*3/uL (ref 0.1–1.0)
Monocytes Relative: 6 %
NEUTROS ABS: 7.6 10*3/uL (ref 1.7–7.7)
NEUTROS PCT: 77 %
Platelets: 232 10*3/uL (ref 150–400)
RBC: 4.43 MIL/uL (ref 3.87–5.11)
RDW: 14.3 % (ref 11.5–15.5)
WBC: 9.9 10*3/uL (ref 4.0–10.5)

## 2017-08-25 LAB — COMPREHENSIVE METABOLIC PANEL
ALT: 7 U/L — ABNORMAL LOW (ref 14–54)
ANION GAP: 13 (ref 5–15)
AST: 12 U/L — ABNORMAL LOW (ref 15–41)
Albumin: 3.4 g/dL — ABNORMAL LOW (ref 3.5–5.0)
Alkaline Phosphatase: 86 U/L (ref 38–126)
BILIRUBIN TOTAL: 0.5 mg/dL (ref 0.3–1.2)
BUN: 8 mg/dL (ref 6–20)
CALCIUM: 8.8 mg/dL — AB (ref 8.9–10.3)
CO2: 26 mmol/L (ref 22–32)
Chloride: 96 mmol/L — ABNORMAL LOW (ref 101–111)
Creatinine, Ser: 0.61 mg/dL (ref 0.44–1.00)
Glucose, Bld: 96 mg/dL (ref 65–99)
Potassium: 3.8 mmol/L (ref 3.5–5.1)
SODIUM: 135 mmol/L (ref 135–145)
TOTAL PROTEIN: 7.5 g/dL (ref 6.5–8.1)

## 2017-08-25 LAB — TSH: TSH: 1.661 u[IU]/mL (ref 0.350–4.500)

## 2017-08-25 MED ORDER — SODIUM CHLORIDE 0.9% FLUSH
10.0000 mL | INTRAVENOUS | Status: DC | PRN
  Administered 2017-08-25: 10 mL
  Filled 2017-08-25: qty 10

## 2017-08-25 MED ORDER — SODIUM CHLORIDE 0.9 % IV SOLN
620.0000 mg | Freq: Once | INTRAVENOUS | Status: AC
  Administered 2017-08-25: 620 mg via INTRAVENOUS
  Filled 2017-08-25: qty 10

## 2017-08-25 MED ORDER — SODIUM CHLORIDE 0.9 % IV SOLN
Freq: Once | INTRAVENOUS | Status: AC
  Administered 2017-08-25: 12:00:00 via INTRAVENOUS

## 2017-08-25 MED ORDER — HEPARIN SOD (PORK) LOCK FLUSH 100 UNIT/ML IV SOLN
500.0000 [IU] | Freq: Once | INTRAVENOUS | Status: AC | PRN
  Administered 2017-08-25: 500 [IU]

## 2017-08-25 MED ORDER — HYDROXYZINE PAMOATE 25 MG PO CAPS: 25.0000 mg | ORAL_CAPSULE | Freq: Three times a day (TID) | ORAL | 0 refills | Status: DC | PRN

## 2017-08-25 NOTE — Progress Notes (Signed)
Sydney Blair tolerated Imfinzi infusion well without complaints or incident. Labs reviewed and pt seen by Faythe Casa NP prior to administering this medication. VSS upon discharge. Pt discharged self ambulatory in satisfactory condition accompanied by a family member

## 2017-08-25 NOTE — Patient Instructions (Signed)
[facility] Discharge Instructions for Patients Receiving Chemotherapy   Beginning January 23rd 2017 lab work for the [facility] will be done in the  Main lab at [facility] on 1st floor. If you have a lab appointment with the [facility] please come in thru the  Main Entrance and check in at the main information desk   Today you received the following chemotherapy agents Imfinzi. Follow-up as scheduled. Call clinic for any questions or concerns  To help prevent nausea and vomiting after your treatment, we encourage you to take your nausea medication   If you develop nausea and vomiting, or diarrhea that is not controlled by your medication, call the clinic.  The clinic phone number is [phone]. Office hours are Monday-Friday 8:30am-5:00pm.  BELOW ARE SYMPTOMS THAT SHOULD BE REPORTED IMMEDIATELY:  *FEVER GREATER THAN 101.0 F  *CHILLS WITH OR WITHOUT FEVER  NAUSEA AND VOMITING THAT IS NOT CONTROLLED WITH YOUR NAUSEA MEDICATION  *UNUSUAL SHORTNESS OF BREATH  *UNUSUAL BRUISING OR BLEEDING  TENDERNESS IN MOUTH AND THROAT WITH OR WITHOUT PRESENCE OF ULCERS  *URINARY PROBLEMS  *BOWEL PROBLEMS  UNUSUAL RASH Items with * indicate a potential emergency and should be followed up as soon as possible. If you have an emergency after office hours please contact your primary care physician or go to the nearest emergency department.  Please call the clinic during office hours if you have any questions or concerns.   You may also contact the Patient Navigator at [phone] should you have any questions or need assistance in obtaining follow up care.      Resources For Cancer Patients and their Caregivers ? American Cancer Society: Can assist with transportation, wigs, general needs, runs Look Good Feel Better.        769 235 8261 ? Cancer Care: Provides financial assistance, online support groups, medication/co-pay assistance.  1-800-813-HOPE  305-562-7817) ? [facility] Assists [place] cancer patients and their families through emotional , educational and financial support.  6146714500 ? [place] DSS Where to apply for food stamps, Medicaid and utility assistance. 651-561-3409 ? [facility]: Transportation to medical appointments. (647) 596-9177 ? Social Security Administration: May apply for disability if have a Stage IV cancer. 408-012-7571 231-377-2845 ? [employer], Disability and Transit Services: Assists with nutrition, care and transit needs. 587-873-2637

## 2017-08-25 NOTE — Progress Notes (Signed)
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     Lung mass   12/08/2016 Imaging    CXR IMPRESSION: 5 cm masslike opacity within the right upper lobe -chest CT with contrast is recommended for further evaluation.      02/01/2017 Initial Diagnosis    Lung mass      02/11/2017 Imaging    PET IMPRESSION: 1. Hypermetabolic RIGHT upper lobe mass consists with bronchogenic carcinoma. 2. Hypermetabolic RIGHT hilar and ipsilateral RIGHT paratracheal metastatic adenopathy. 3. No contralateral nodal metastasis or supraclavicular nodal metastasis.      02/28/2017 Surgery    Operation: Flexible video fiberoptic bronchoscopy with endobronchial ultrasound and biopsies.      02/28/2017 Pathology Results    Cytology Diagnosis BRONCHIAL BRUSHING SPECIMEN D, RIGHT UPPER LOBE (SPECIMEN 4 OF 4 COLLECTED 02/28/2017) MALIGNANT CELLS CONSISTENT WITH SQUAMOUS CELL CARCINOMA.         HISTORY OF PRESENT ILLNESS:  (From Dr. Laverle Blair note on 06/28/17)       INTERVAL HISTORY:  Sydney Blair 62 y.o. female returns for routine follow-up for lung cancer. Her concurrent chemoradiation was given in [place] at [facility] under the care of Dr. Julien Blair. She transferred her care to [facility] as it is closer to home for her.   Here today with a friend. Due for next cycle of Imfinzi.   Overall, she tells me she has been feeling "pretty good."  Appetite 75%; she reports feeling very tired with "no energy."  Remains on 2L O2  via nasal cannula, which is her baseline.    She has chronic complaints of pain to her back/legs/feet, as well as peripheral neuropathy. Both of these issues were present prior to chemo and are largely stable; "they're about the same."  She has intermittent cough with chest wall soreness at times; she has dyspnea on exertion.  "I think some of that is because I'm still getting over the pneumonia I had."  She also has some dizziness with exertion as well (which was also present prior to her previous chemo).  She offers a new complaint of itching "everywhere".  The itching began approximately 2 weeks ago.  She denies rash associated with the itching and denies any changes to laundry detergent or lotions.  She has tried Benadryl with some relief.  Additional complaints of sweating but denies a fever.  She has checked her temperature multiple times she remains afebrile.  Continues to tolerate infancy well.  Denies any bowel changes including diarrhea or constipation.  She continues her daily Ensures   REVIEW OF SYSTEMS:  Review of Systems  Constitutional: Positive for diaphoresis and fatigue. Negative for chills and fever.  HENT:   Positive for trouble swallowing.   Eyes: Negative.   Respiratory: Positive for shortness of breath and wheezing. Negative for cough.   Gastrointestinal: Negative.  Negative for abdominal pain, blood in stool, constipation, diarrhea, nausea and vomiting.  Endocrine: Negative.   Genitourinary: Negative.  Negative for dysuria, hematuria and vaginal bleeding.   Musculoskeletal: Positive for arthralgias  and back pain.  Skin: Negative.  Negative for rash.  Neurological: Positive for dizziness and numbness.  Hematological: Negative.   Psychiatric/Behavioral: Negative.       PAST MEDICAL/SURGICAL HISTORY:  Past Medical History:  Diagnosis Date  . Anxiety   . ARF (acute renal failure) ([place]) 08/07/2011   ?   Marland Kitchen Arthritis   . Cancer ([place]) 2005   breast-right  . Chronic back  pain   . COPD (chronic obstructive pulmonary disease) ([place])   . Depression   . Depression with anxiety   . Dyspnea    on exertion  . Fibromyalgia   . GERD (gastroesophageal reflux disease)   . History of radiation therapy 03/24/17-05/12/17   right lun 2 Gy in 30 fractions  . Hypercholesteremia   . Hypothyroidism   . Hypothyroidism 06/19/2017  . Incontinence   . Neuropathy   . Peripheral neuropathy   . Sciatic pain   . Sebaceous cyst    lt labial location  . Sleep apnea    uses CPAP  . Stage III squamous cell carcinoma of right lung ([place]) 03/10/2017  . Tobacco abuse    Past Surgical History:  Procedure Laterality Date  . bladder tack    . BREAST BIOPSY Right 12/29/2016   Procedure: RIGHT BREAST BIOPSY;  Surgeon: Sydney Signs, MD;  Location: [facility];  Service: General;  Laterality: Right;  . BREAST LUMPECTOMY     right with node dissection  . COLONOSCOPY  11/11/2010   MPN:[id number] rectal polyp and splenic flexure otherwise normal; pathology showed  tubular adenomas. Next TCS 10/2015.  Marland Kitchen ESOPHAGOGASTRODUODENOSCOPY  11/11/2010   GQQ:PYPPJ hiatal hernia, 60 French dilator  . ESOPHAGOGASTRODUODENOSCOPY (EGD) WITH ESOPHAGEAL DILATION N/A 06/21/2013   KDT:OIZTIW esophagus  - status post Maloney dilation s/p bx (benign esophageal bx)  . PORTACATH PLACEMENT Left 07/06/2017   Procedure: INSERTION PORT-A-CATH LEFT SUBCLAVIAN;  Surgeon: Sydney Cagey, MD;  Location: [facility];  Service: General;  Laterality: Left;  Marland Kitchen VIDEO BRONCHOSCOPY WITH ENDOBRONCHIAL ULTRASOUND N/A 02/28/2017   Procedure: VIDEO BRONCHOSCOPY WITH ENDOBRONCHIAL ULTRASOUND;  Surgeon: Sydney Doom, MD;  Location: [facility];  Service: Thoracic;  Laterality: N/A;     SOCIAL HISTORY:  Social History   Socioeconomic History  . Marital status: Married    Spouse name: Not on file  . Number of children: 1  . Years of education: Not on file  . Highest education level: Not on file  Social Needs  . Financial resource  strain: Not on file  . Food insecurity - worry: Not on file  . Food insecurity - inability: Not on file  . Transportation needs - medical: Not on file  . Transportation needs - non-medical: Not on file  Occupational History  . Occupation: disabled    Fish farm manager: UNEMPLOYED  Tobacco Use  . Smoking status: Current Every Day Smoker    Packs/day: 1.00    Years: 35.00    Pack years: 35.00    Types: Cigarettes  . Smokeless tobacco: Never Used  . Tobacco comment: has cut back to one pack daily  Substance and Sexual Activity  . Alcohol use: No    Alcohol/week: 0.0 oz  . Drug use: No  . Sexual activity: Yes    Birth control/protection: Post-menopausal    Comment: chemo stopped her periods  Other Topics Concern  . Not on file  Social History Narrative   Lives w. Husband   Lost 1 son-?etiology    FAMILY HISTORY:  Family History  Problem Relation Age of Onset  . Cancer Other   . Heart attack Other   . Lung disease Other   . Emphysema Mother   . Autoimmune disease Neg Hx     CURRENT MEDICATIONS:  Outpatient Encounter Medications as of 08/25/2017  Medication Sig Note  . acetaminophen (TYLENOL) 500 MG tablet Take 500 mg by mouth every 6 (six) hours as needed for headache.   . albuterol (PROVENTIL HFA;VENTOLIN HFA) 108 (90 BASE) MCG/ACT inhaler Inhale 2 puffs into the lungs every 6 (six) hours as needed for wheezing or shortness of breath.   Marland Kitchen aspirin 81 MG chewable tablet Chew 81 mg by mouth daily.   . budesonide-formoterol (SYMBICORT) 160-4.5 MCG/ACT inhaler Inhale 2 puffs into the lungs daily.    [place] Oris (IMFINZI IV) Inject into the vein. Every 2 weeks   . levalbuterol (XOPENEX) 0.63 MG/3ML nebulizer solution Take 3 mLs (0.63 mg total) by nebulization 3 (three) times daily. 06/19/2017: Machine broke  . levothyroxine (SYNTHROID, LEVOTHROID) 100 MCG tablet Take 100 mcg by mouth daily before breakfast. Reported on 02/03/2016   . lidocaine-prilocaine (EMLA) cream Apply a quarter size  amount to affected area 1 hour prior to coming to chemotherapy.   . metoprolol tartrate (LOPRESSOR) 50 MG tablet Take 1 tablet (50 mg total) by mouth 3 (three) times daily.   . ondansetron (ZOFRAN) 8 MG tablet Take 1 tablet (8 mg total) every 8 (eight) hours as needed by mouth for nausea or vomiting.   Marland Kitchen oxyCODONE (ROXICODONE) 5 MG immediate release tablet Take 1 tablet (5 mg total) every 6 (six) hours as needed by mouth for severe pain or breakthrough pain.   . potassium chloride SA (K-DUR,KLOR-CON) 20 MEQ tablet TAKE ONE TABLET ON MONDAY, WEDNESDAY, AND FRIDAYS.   Marland Kitchen prochlorperazine (COMPAZINE) 10 MG tablet Take 1 tablet (10 mg total) every 6 (six) hours as needed by mouth for nausea or vomiting.   . tiotropium (SPIRIVA) 18 MCG inhalation capsule Place 1 capsule (18 mcg total) into inhaler and inhale daily.   . hydrOXYzine (VISTARIL) 25 MG capsule Take 1 capsule (25 mg total) by mouth 3 (three) times daily as needed for itching.    No facility-administered encounter medications on file as of 08/25/2017.     ALLERGIES:  Allergies  Allergen Reactions  . Penicillins Anaphylaxis and Other (See Comments)    Has patient had a PCN reaction causing immediate rash, facial/tongue/throat swelling, SOB or lightheadedness with hypotension:Yes Has patient had a PCN reaction causing severe rash involving mucus membranes or skin necrosis:Yes Has patient had a PCN reaction that required hospitalization:No Has patient had a PCN reaction occurring within the last 10 years:No If all of the above answers are "NO", then may proceed with Cephalosporin use.      PHYSICAL EXAM:  ECOG Performance status: 2 - Symptomatic; requires occasional assistance      Physical Exam  Constitutional: She is oriented to person, place, and time and well-developed, well-nourished, and in no distress. Vital Blair are normal.  Chronically-ill appearing female in no acute distress  Patient notes pruritus all over.  No skin  lesions/rash present  HENT:  Head: Normocephalic.  Mouth/Throat: Oropharynx is clear and moist.  Eyes: Conjunctivae are normal. No scleral icterus.  Neck: Normal range of motion. Neck supple.  Cardiovascular: Normal rate and regular rhythm.  Pulmonary/Chest: Effort normal. No respiratory distress. She has wheezes in the right upper field, the right middle field and the right lower field.  -2L O2 via  [place] in place -Wheezing heard in the upper fields of the right lung.  Left lung diminished throughout.   Abdominal: Soft. Bowel sounds are normal. There is no tenderness.  Musculoskeletal: Normal range of motion. She exhibits edema (Trace ankle edema bilat).  Lymphadenopathy:    She has no cervical adenopathy.       Right: No supraclavicular adenopathy present.       Left: No supraclavicular adenopathy present.  Neurological: She is alert and oriented to person, place, and time. No cranial nerve deficit.  Skin: Skin is warm and dry. No rash noted.  Psychiatric: Mood, memory, affect and judgment normal.  Nursing note and vitals reviewed.    LABORATORY DATA:  I have reviewed the labs as listed.  CBC    Component Value Date/Time   WBC 9.9 08/25/2017 1039   RBC 4.43 08/25/2017 1039   HGB 12.3 08/25/2017 1039   HGB 8.9 (L) 06/03/2017 0804   HCT 40.2 08/25/2017 1039   HCT 27.8 (L) 06/03/2017 0804   PLT 232 08/25/2017 1039   PLT 114 (L) 06/03/2017 0804   MCV 90.7 08/25/2017 1039   MCV 99.3 06/03/2017 0804   MCH 27.8 08/25/2017 1039   MCHC 30.6 08/25/2017 1039   RDW 14.3 08/25/2017 1039   RDW 23.7 (H) 06/03/2017 0804   LYMPHSABS 1.2 08/25/2017 1039   LYMPHSABS 0.9 06/03/2017 0804   MONOABS 0.5 08/25/2017 1039   MONOABS 0.3 06/03/2017 0804   EOSABS 0.4 08/25/2017 1039   EOSABS 0.2 06/03/2017 0804   BASOSABS 0.1 08/25/2017 1039   BASOSABS 0.0 06/03/2017 0804   CMP Latest Ref Rng & Units 08/25/2017 08/09/2017 07/26/2017  Glucose 65 - 99 mg/dL 96 86 78  BUN 6 - 20 mg/dL '8 7 7    '$ Creatinine 0.44 - 1.00 mg/dL 0.61 0.57 0.56  Sodium 135 - 145 mmol/L 135 133(L) 135  Potassium 3.5 - 5.1 mmol/L 3.8 3.7 3.5  Chloride 101 - 111 mmol/L 96(L) 95(L) 99(L)  CO2 22 - 32 mmol/L '26 27 30  '$ Calcium 8.9 - 10.3 mg/dL 8.8(L) 8.7(L) 8.9  Total Protein 6.5 - 8.1 g/dL 7.5 7.1 7.5  Total Bilirubin 0.3 - 1.2 mg/dL 0.5 0.7 0.6  Alkaline Phos 38 - 126 U/L 86 81 80  AST 15 - 41 U/L 12(L) 11(L) 15  ALT 14 - 54 U/L 7(L) 7(L) 8(L)    PENDING LABS:    DIAGNOSTIC IMAGING:  *The following radiologic images and reports have been reviewed independently and agree with below findings.  CTA chest: 06/19/17 CLINICAL DATA:  Hemoptysis.  History of lung and breast cancer.  EXAM: CT ANGIOGRAPHY CHEST WITH CONTRAST  TECHNIQUE: Multidetector CT imaging of the chest was performed using the standard protocol during bolus administration of intravenous contrast. Multiplanar CT image reconstructions and MIPs were obtained to evaluate the vascular anatomy.  CONTRAST:  100 cc Isovue 370  COMPARISON:  Chest radiograph June 18, 2017 at 2358 hours and PET CT February 11, 2017  FINDINGS: CARDIOVASCULAR: Adequate contrast opacification of the pulmonary artery's. Main pulmonary artery is not enlarged. No pulmonary arterial filling defects to the level of the subsegmental branches. Heart size is normal, no right heart strain. No pericardial effusion. Thoracic aorta is normal course and caliber, mild calcific atherosclerosis.  MEDIASTINUM/NODES: Scattered calcified subcentimeter lymph nodes. Decreased size of RIGHT hilar and anterior mediastinal lymphadenopathy.  LUNGS/PLEURA: Necrotic, cavitated spiculated 3.8 x 4.9 cm RIGHT upper lobe mass was 4.6 x 5.5 cm. Mass encases the RIGHT upper lobe pulmonary  artery which remains patent. Narrowed RIGHT upper lobe bronchus. Diffuse bronchial wall thickening. Focal RIGHT lower lobe bronchiectasis and mucoid impaction. Biapical bullous changes  in RIGHT pleural thickening. Tree-in-bud infiltrates bilateral upper lobes, to lesser extent RIGHT middle RIGHT lower lobe. Scattered punctate calcified granulomas. Mild centrilobular emphysema. No pleural effusion.  UPPER ABDOMEN: Unchanged LEFT hydro nephrosis and bilateral extrarenal pelvises. No adrenal mass.  MUSCULOSKELETAL: Visualized soft tissues and included osseous structures are nonacute . Skin thickening coarse calcifications RIGHT breast are improved. Old RIGHT posterior rib fractures. Surgical clips RIGHT axilla consistent with nodal dissection. Moderate canal stenosis C6-7 due to large disc osteophyte complex.  Review of the MIP images confirms the above findings.  IMPRESSION: 1. No acute pulmonary embolism. 2. Scattered tree-in-bud infiltrates may be infectious or inflammatory, lymphangitic spread of tumor not excluded. Diffuse bronchial wall thickening. Low 3. Smaller RIGHT upper lobe carcinoma, now necrotic/cavitated. Decreased size of nodal metastasis. 4. Chronic LEFT hydronephrosis.  Aortic Atherosclerosis (ICD10-I70.0) and Emphysema (ICD10-J43.9).   Electronically Signed   By: Elon Alas M.D.   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 Treated with concurrent chemoradiation with weekly Carbo/Taxol x 6 cycles; last dose of chemo 05/02/17.  Post-treatment CTA chest on 06/19/17 showed smaller RUL carcinoma, now necrotic/cavitated, also with decreased size of nodal mets.  PD-L1 negative (0%).  Transferred her care to [facility] in 06/2017.  Port-a-cath was placed on 07/06/17 by Dr. Constance Haw in [place].  Consolidation therapy with Imfinzi started 07/12/17.  -Here for cycle #4 Imfinzi today. Labs reviewed and are adequate for treatment. She has tolerated therapy well thus far. Her fatigue and  shortness of breath appear to be at her baseline and are no worse per her report. No rash or diarrhea reported.  -Restaging CT chest orders placed. Will get prior to cycle 6. She is scheduled to see MD at  Cycle 6 visit and they can review images at that time. -Continue Imfinzi every 2 weeks for total of 1 year of therapy.  -Return to cancer center in 4 weeks for follow-up visit.   Nutrition:  -Reinforced the importance of adequate nutrition as she continues treatment for lung cancer.  Continue boost and Ensures.  At risk for endocrinopathies:  -Baseline TSH is normal. Last TSH 0.535 on 07/12/17. Will continue to periodically monitor for thyroid dysfunction and other possible endocrinopathies while on immunotherapy.  We will check TSH at next visit.  History of right breast cancer:  -Diagnosed in 2005. Treated with lumpectomy, radiation, and chemotherapy per documentation.  -Last diagnostic (R) breast mammogram and ultrasound performed on 10/21/16 showed concerns for hypoechoic mass-like shadowing and peau d'orange with firmness on physical exam by radiologist.  -Underwent (R) breast biopsy at 2:00 position on 11/02/16 with pathology showing benign breast tissue with fat necrosis; no evidence of malignancy.  -Additional (R) breast biopsy performed by Dr. Arnoldo Morale on 12/29/16 again with no malignancy identified on pathology.  -She will need clinical breast exam at her next follow-up visit.   -Annual screening mammogram scheduled for March 2019.    Itching: Rx Vistaril 25 mg TID PRN for itching.  Patient has tried Benadryl in the past.  We will see if this helps.  Dispo:  -Continue Imfinzi every 2 weeks.  -Return to cancer center in 4  weeks for follow-up visit with MD/labs, imfinzi treatment and results of CT scan.  Hopeful  All questions were answered to patient's stated satisfaction. Encouraged patient to call with any new concerns or questions before her next visit to the cancer center and we can  certain see her sooner, if needed.    Orders placed this encounter:  Orders Placed This Encounter  Procedures  . CT Chest W Contrast  . MM DIAG BREAST TOMO BILATERAL    Faythe Casa, NP 08/25/2017 1:53 PM

## 2017-08-30 ENCOUNTER — Telehealth (HOSPITAL_COMMUNITY): Payer: Self-pay | Admitting: Emergency Medicine

## 2017-08-30 NOTE — Telephone Encounter (Signed)
Pt called and said she thought she had caught a cold.  She had been trying to call her PCP.  She has an appt on Monday with him.  She said for a few days she has had sore throat, cough, and runny nose.  Wanted to know if there was something she could take that was OTC.  Explained she could take mucinex, or cold and sinus medication or delsym for the cough.  She verbalized understanding.

## 2017-09-06 ENCOUNTER — Ambulatory Visit: Payer: Medicaid Other | Admitting: Cardiovascular Disease

## 2017-09-13 ENCOUNTER — Encounter (HOSPITAL_COMMUNITY): Payer: Self-pay

## 2017-09-13 ENCOUNTER — Inpatient Hospital Stay (HOSPITAL_COMMUNITY): Payer: Medicaid Other

## 2017-09-13 ENCOUNTER — Encounter (HOSPITAL_COMMUNITY): Payer: Self-pay | Admitting: Adult Health

## 2017-09-13 ENCOUNTER — Inpatient Hospital Stay (HOSPITAL_BASED_OUTPATIENT_CLINIC_OR_DEPARTMENT_OTHER): Payer: Medicaid Other | Admitting: Adult Health

## 2017-09-13 DIAGNOSIS — J069 Acute upper respiratory infection, unspecified: Secondary | ICD-10-CM

## 2017-09-13 DIAGNOSIS — N6310 Unspecified lump in the right breast, unspecified quadrant: Secondary | ICD-10-CM

## 2017-09-13 DIAGNOSIS — C3491 Malignant neoplasm of unspecified part of right bronchus or lung: Secondary | ICD-10-CM

## 2017-09-13 DIAGNOSIS — C3411 Malignant neoplasm of upper lobe, right bronchus or lung: Secondary | ICD-10-CM | POA: Diagnosis not present

## 2017-09-13 DIAGNOSIS — E876 Hypokalemia: Secondary | ICD-10-CM

## 2017-09-13 DIAGNOSIS — N6314 Unspecified lump in the right breast, lower inner quadrant: Secondary | ICD-10-CM

## 2017-09-13 DIAGNOSIS — Z5112 Encounter for antineoplastic immunotherapy: Secondary | ICD-10-CM

## 2017-09-13 DIAGNOSIS — F17210 Nicotine dependence, cigarettes, uncomplicated: Secondary | ICD-10-CM

## 2017-09-13 DIAGNOSIS — C3490 Malignant neoplasm of unspecified part of unspecified bronchus or lung: Secondary | ICD-10-CM

## 2017-09-13 LAB — CBC WITH DIFFERENTIAL/PLATELET
Basophils Absolute: 0.1 10*3/uL (ref 0.0–0.1)
Basophils Relative: 0 %
EOS ABS: 0.3 10*3/uL (ref 0.0–0.7)
Eosinophils Relative: 2 %
HCT: 41 % (ref 36.0–46.0)
HEMOGLOBIN: 12.7 g/dL (ref 12.0–15.0)
LYMPHS PCT: 9 %
Lymphs Abs: 1.1 10*3/uL (ref 0.7–4.0)
MCH: 27.3 pg (ref 26.0–34.0)
MCHC: 31 g/dL (ref 30.0–36.0)
MCV: 88 fL (ref 78.0–100.0)
MONOS PCT: 7 %
Monocytes Absolute: 0.8 10*3/uL (ref 0.1–1.0)
NEUTROS PCT: 82 %
Neutro Abs: 9.8 10*3/uL — ABNORMAL HIGH (ref 1.7–7.7)
Platelets: 270 10*3/uL (ref 150–400)
RBC: 4.66 MIL/uL (ref 3.87–5.11)
RDW: 14.5 % (ref 11.5–15.5)
WBC: 12 10*3/uL — AB (ref 4.0–10.5)

## 2017-09-13 LAB — COMPREHENSIVE METABOLIC PANEL
ALT: 7 U/L — AB (ref 14–54)
AST: 16 U/L (ref 15–41)
Albumin: 3.3 g/dL — ABNORMAL LOW (ref 3.5–5.0)
Alkaline Phosphatase: 84 U/L (ref 38–126)
Anion gap: 11 (ref 5–15)
BUN: 7 mg/dL (ref 6–20)
CO2: 27 mmol/L (ref 22–32)
CREATININE: 0.67 mg/dL (ref 0.44–1.00)
Calcium: 8.5 mg/dL — ABNORMAL LOW (ref 8.9–10.3)
Chloride: 97 mmol/L — ABNORMAL LOW (ref 101–111)
GFR calc non Af Amer: >60 mL/min (ref >60)
Glucose, Bld: 145 mg/dL — ABNORMAL HIGH (ref 65–99)
POTASSIUM: 3 mmol/L — AB (ref 3.5–5.1)
SODIUM: 135 mmol/L (ref 135–145)
Total Bilirubin: 0.3 mg/dL (ref 0.3–1.2)
Total Protein: 7.4 g/dL (ref 6.5–8.1)

## 2017-09-13 MED ORDER — HEPARIN SOD (PORK) LOCK FLUSH 100 UNIT/ML IV SOLN
500.0000 [IU] | Freq: Once | INTRAVENOUS | Status: AC
  Administered 2017-09-13: 500 [IU] via INTRAVENOUS

## 2017-09-13 MED ORDER — POTASSIUM CHLORIDE ER 10 MEQ PO TBCR: 40.0000 meq | EXTENDED_RELEASE_TABLET | Freq: Every day | ORAL | 0 refills | Status: DC

## 2017-09-13 MED ORDER — LEVOFLOXACIN 750 MG PO TABS: 750.0000 mg | ORAL_TABLET | Freq: Every day | ORAL | 0 refills | Status: DC

## 2017-09-13 MED ORDER — SODIUM CHLORIDE 0.9% FLUSH
20.0000 mL | INTRAVENOUS | Status: DC | PRN
  Administered 2017-09-13: 20 mL via INTRAVENOUS
  Filled 2017-09-13: qty 20

## 2017-09-13 NOTE — Progress Notes (Addendum)
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     Lung mass   12/08/2016 Imaging    CXR IMPRESSION: 5 cm masslike opacity within the right upper lobe -chest CT with contrast is recommended for further evaluation.      02/01/2017 Initial Diagnosis    Lung mass      02/11/2017 Imaging    PET IMPRESSION: 1. Hypermetabolic RIGHT upper lobe mass consists with bronchogenic carcinoma. 2. Hypermetabolic RIGHT hilar and ipsilateral RIGHT paratracheal metastatic adenopathy. 3. No contralateral nodal metastasis or supraclavicular nodal metastasis.      02/28/2017 Surgery    Operation: Flexible video fiberoptic bronchoscopy with endobronchial ultrasound and biopsies.      02/28/2017 Pathology Results    Cytology Diagnosis BRONCHIAL BRUSHING SPECIMEN D, RIGHT UPPER LOBE (SPECIMEN 4 OF 4 COLLECTED 02/28/2017) MALIGNANT CELLS CONSISTENT WITH SQUAMOUS CELL CARCINOMA.         HISTORY OF PRESENT ILLNESS:  (From Dr. Laverle Patter note on 06/28/17)       INTERVAL HISTORY:  Ms. Viswanathan 62 y.o. female returns for routine follow-up for lung cancer. Her concurrent chemoradiation was given in [place] at [facility] under the care of Dr. Julien Nordmann. She transferred her care to [facility] as it is closer to home for her.   Here today unaccompanied.  Due for next cycle of Imfinzi.   For the past week or so, patient tells me she has had progressive sore throat and cough.   She was prescribed a decongestant from her PCP, but this has not been effective in controlling her symptoms.  She is not sure if she has had a fever; she does endorse chills and occasional diaphoresis.  She tells me "my cough is really bad when I get hot."  She states "I really do not feel good today.  I know I need my treatment, but I just do not feel good."  Her appetite has been fair.  Her energy levels are poor; "I have no energy at all."  She tries to rest often.  "I really just do not want to get pneumonia again."  Denies any rash or diarrhea.  Denies any breast masses that she is aware of.  She is due for mammogram in 10/2017.  She is due for repeat CT chest on 09/22/17.  She is wondering if she should have a course of antibiotics for her upper respiratory symptoms.  She is not sure if she wants treatment today.    REVIEW OF SYSTEMS:  Review of Systems  Constitutional: Positive for chills, diaphoresis and fatigue. Negative for fever.  HENT:   Positive for sore throat.   Eyes: Negative.   Respiratory: Positive for cough and shortness of breath.   Cardiovascular: Negative.   Gastrointestinal: Negative.   Endocrine: Negative.   Genitourinary: Negative.    Musculoskeletal: Negative.   Skin: Negative.  Negative for rash.  Neurological: Negative.   Hematological: Negative.   Psychiatric/Behavioral: Negative.       PAST  MEDICAL/SURGICAL HISTORY:  Past Medical History:  Diagnosis Date  . Anxiety   . ARF (acute renal failure) ([place]) 08/07/2011   ?   Marland Kitchen Arthritis   . Cancer ([place]) 2005   breast-right  . Chronic back pain   . COPD (chronic obstructive pulmonary disease) ([place])   . Depression   . Depression with anxiety   . Dyspnea    on exertion  . Fibromyalgia   . GERD (gastroesophageal reflux disease)   . History of radiation therapy 03/24/17-05/12/17   right lun 2 Gy in 30 fractions  . Hypercholesteremia   . Hypothyroidism   . Hypothyroidism 06/19/2017  . Incontinence   .  Neuropathy   . Peripheral neuropathy   . Sciatic pain   . Sebaceous cyst    lt labial location  . Sleep apnea    uses CPAP  . Stage III squamous cell carcinoma of right lung ([place]) 03/10/2017  . Tobacco abuse    Past Surgical History:  Procedure Laterality Date  . bladder tack    . BREAST BIOPSY Right 12/29/2016   Procedure: RIGHT BREAST BIOPSY;  Surgeon: Aviva Signs, MD;  Location: [facility];  Service: General;  Laterality: Right;  . BREAST LUMPECTOMY     right with node dissection  . COLONOSCOPY  11/11/2010   WIO:XBDZHGDJME rectal polyp and splenic flexure otherwise normal; pathology showed  tubular adenomas. Next TCS 10/2015.  Marland Kitchen ESOPHAGOGASTRODUODENOSCOPY  11/11/2010   QAS:TMHDQ hiatal hernia, 16 French dilator  . ESOPHAGOGASTRODUODENOSCOPY (EGD) WITH ESOPHAGEAL DILATION N/A 06/21/2013   QIW:LNLGXQ esophagus  - status post Maloney dilation s/p bx (benign esophageal bx)  . PORTACATH PLACEMENT Left 07/06/2017   Procedure: INSERTION PORT-A-CATH LEFT SUBCLAVIAN;  Surgeon: Virl Cagey, MD;  Location: [facility];  Service: General;  Laterality: Left;  Marland Kitchen VIDEO BRONCHOSCOPY WITH ENDOBRONCHIAL ULTRASOUND N/A 02/28/2017   Procedure: VIDEO BRONCHOSCOPY WITH ENDOBRONCHIAL ULTRASOUND;  Surgeon: Juanito Doom, MD;  Location: [facility];  Service: Thoracic;  Laterality: N/A;     SOCIAL HISTORY:  Social History   Socioeconomic History  . Marital status: Married    Spouse name: Not on file  . Number of children: 1  . Years of education: Not on file  . Highest education level: Not on file  Social Needs  . Financial resource strain: Not on file  . Food insecurity - worry: Not on file  . Food insecurity - inability: Not on file  . Transportation needs - medical: Not on file  . Transportation needs - non-medical: Not on file  Occupational History  . Occupation: disabled    Fish farm manager: UNEMPLOYED  Tobacco Use  . Smoking status: Current Every Day Smoker    Packs/day: 1.00    Years: 35.00     Pack years: 35.00    Types: Cigarettes  . Smokeless tobacco: Never Used  . Tobacco comment: has cut back to one pack daily  Substance and Sexual Activity  . Alcohol use: No    Alcohol/week: 0.0 oz  . Drug use: No  . Sexual activity: Yes    Birth control/protection: Post-menopausal    Comment: chemo stopped her periods  Other Topics Concern  . Not on file  Social History Narrative   Lives w. Husband   Lost 1 son-?etiology    FAMILY HISTORY:  Family History  Problem Relation Age of Onset  . Cancer Other   . Heart attack Other   . Lung disease Other   . Emphysema Mother   . Autoimmune disease  Neg Hx     CURRENT MEDICATIONS:  Outpatient Encounter Medications as of 09/13/2017  Medication Sig Note  . acetaminophen (TYLENOL) 500 MG tablet Take 500 mg by mouth every 6 (six) hours as needed for headache.   . albuterol (PROVENTIL HFA;VENTOLIN HFA) 108 (90 BASE) MCG/ACT inhaler Inhale 2 puffs into the lungs every 6 (six) hours as needed for wheezing or shortness of breath.   Marland Kitchen aspirin 81 MG chewable tablet Chew 81 mg by mouth daily.   . budesonide-formoterol (SYMBICORT) 160-4.5 MCG/ACT inhaler Inhale 2 puffs into the lungs daily.    [place] (IMFINZI IV) Inject into the vein. Every 2 weeks   . hydrOXYzine (VISTARIL) 25 MG capsule Take 1 capsule (25 mg total) by mouth 3 (three) times daily as needed for itching.   . levalbuterol (XOPENEX) 0.63 MG/3ML nebulizer solution Take 3 mLs (0.63 mg total) by nebulization 3 (three) times daily. 06/19/2017: Machine broke  . levofloxacin (LEVAQUIN) 750 MG tablet Take 1 tablet (750 mg total) by mouth daily.   Marland Kitchen levothyroxine (SYNTHROID, LEVOTHROID) 100 MCG tablet Take 100 mcg by mouth daily before breakfast. Reported on 02/03/2016   . lidocaine-prilocaine (EMLA) cream Apply a quarter size amount to affected area 1 hour prior to coming to chemotherapy.   . metoprolol tartrate (LOPRESSOR) 50 MG tablet Take 1 tablet (50 mg total) by mouth 3 (three)  times daily.   . ondansetron (ZOFRAN) 8 MG tablet Take 1 tablet (8 mg total) every 8 (eight) hours as needed by mouth for nausea or vomiting.   Marland Kitchen oxyCODONE (ROXICODONE) 5 MG immediate release tablet Take 1 tablet (5 mg total) every 6 (six) hours as needed by mouth for severe pain or breakthrough pain.   . potassium chloride SA (K-DUR,KLOR-CON) 20 MEQ tablet TAKE ONE TABLET ON MONDAY, WEDNESDAY, AND FRIDAYS.   Marland Kitchen prochlorperazine (COMPAZINE) 10 MG tablet Take 1 tablet (10 mg total) every 6 (six) hours as needed by mouth for nausea or vomiting.   . tiotropium (SPIRIVA) 18 MCG inhalation capsule Place 1 capsule (18 mcg total) into inhaler and inhale daily.    No facility-administered encounter medications on file as of 09/13/2017.     ALLERGIES:  Allergies  Allergen Reactions  . Penicillins Anaphylaxis and Other (See Comments)    Has patient had a PCN reaction causing immediate rash, facial/tongue/throat swelling, SOB or lightheadedness with hypotension:Yes Has patient had a PCN reaction causing severe rash involving mucus membranes or skin necrosis:Yes Has patient had a PCN reaction that required hospitalization:No Has patient had a PCN reaction occurring within the last 10 years:No If all of the above answers are "NO", then may proceed with Cephalosporin use.      PHYSICAL EXAM:  ECOG Performance status: 2 - Symptomatic; requires occasional assistance   BP 137/63 Pulse 80 Temperature 97.6 Respirations 22 O2 sat 85% on room air; O2 sat rechecked 99% on 2 L O2 via nasal cannula  Weight 145 lbs 9.6 oz   Physical Exam  Constitutional: She is oriented to person, place, and time.  Chronically-ill appearing female in no acute distress  HENT:  Head: Normocephalic.  Posterior oropharynx red; no exudate appreciated.   Eyes: Conjunctivae are normal. Pupils are equal, round, and reactive to light. No scleral icterus.  Neck: Normal range of motion. Neck supple.  Cardiovascular: Normal rate  and regular rhythm.  Pulmonary/Chest: Effort normal. No respiratory distress.    -Rhonchi noted to RLL; otherwise, diminished breath sounds throughout but no definitive crackles on exam. -  2L O2 via [place] in place   Abdominal: Soft. Bowel sounds are normal. There is no tenderness. There is no rebound.  Musculoskeletal: Normal range of motion. She exhibits no edema.  Lymphadenopathy:    She has no cervical adenopathy.       Right: No supraclavicular adenopathy present.       Left: No supraclavicular adenopathy present.  Neurological: She is alert and oriented to person, place, and time. No cranial nerve deficit.  Skin: Skin is warm and dry. No rash noted.  Skin is very dry   Psychiatric: Mood, memory, affect and judgment normal.  Nursing note and vitals reviewed.    LABORATORY DATA:  I have reviewed the labs as listed.  CBC    Component Value Date/Time   WBC 9.9 08/25/2017 1039   RBC 4.43 08/25/2017 1039   HGB 12.3 08/25/2017 1039   HGB 8.9 (L) 06/03/2017 0804   HCT 40.2 08/25/2017 1039   HCT 27.8 (L) 06/03/2017 0804   PLT 232 08/25/2017 1039   PLT 114 (L) 06/03/2017 0804   MCV 90.7 08/25/2017 1039   MCV 99.3 06/03/2017 0804   MCH 27.8 08/25/2017 1039   MCHC 30.6 08/25/2017 1039   RDW 14.3 08/25/2017 1039   RDW 23.7 (H) 06/03/2017 0804   LYMPHSABS 1.2 08/25/2017 1039   LYMPHSABS 0.9 06/03/2017 0804   MONOABS 0.5 08/25/2017 1039   MONOABS 0.3 06/03/2017 0804   EOSABS 0.4 08/25/2017 1039   EOSABS 0.2 06/03/2017 0804   BASOSABS 0.1 08/25/2017 1039   BASOSABS 0.0 06/03/2017 0804   CMP Latest Ref Rng & Units 09/13/2017 08/25/2017 08/09/2017  Glucose 65 - 99 mg/dL 145(H) 96 86  BUN 6 - 20 mg/dL '7 8 7  '$ Creatinine 0.44 - 1.00 mg/dL 0.67 0.61 0.57  Sodium 135 - 145 mmol/L 135 135 133(L)  Potassium 3.5 - 5.1 mmol/L 3.0(L) 3.8 3.7  Chloride 101 - 111 mmol/L 97(L) 96(L) 95(L)  CO2 22 - 32 mmol/L '27 26 27  '$ Calcium 8.9 - 10.3 mg/dL 8.5(L) 8.8(L) 8.7(L)  Total Protein 6.5 - 8.1 g/dL  7.4 7.5 7.1  Total Bilirubin 0.3 - 1.2 mg/dL 0.3 0.5 0.7  Alkaline Phos 38 - 126 U/L 84 86 81  AST 15 - 41 U/L 16 12(L) 11(L)  ALT 14 - 54 U/L 7(L) 7(L) 7(L)    PENDING LABS:    DIAGNOSTIC IMAGING:  *The following radiologic images and reports have been reviewed independently and agree with below findings.  CTA chest: 06/19/17 CLINICAL DATA:  Hemoptysis.  History of lung and breast cancer.  EXAM: CT ANGIOGRAPHY CHEST WITH CONTRAST  TECHNIQUE: Multidetector CT imaging of the chest was performed using the standard protocol during bolus administration of intravenous contrast. Multiplanar CT image reconstructions and MIPs were obtained to evaluate the vascular anatomy.  CONTRAST:  100 cc Isovue 370  COMPARISON:  Chest radiograph June 18, 2017 at 2358 hours and PET CT February 11, 2017  FINDINGS: CARDIOVASCULAR: Adequate contrast opacification of the pulmonary artery's. Main pulmonary artery is not enlarged. No pulmonary arterial filling defects to the level of the subsegmental branches. Heart size is normal, no right heart strain. No pericardial effusion. Thoracic aorta is normal course and caliber, mild calcific atherosclerosis.  MEDIASTINUM/NODES: Scattered calcified subcentimeter lymph nodes. Decreased size of RIGHT hilar and anterior mediastinal lymphadenopathy.  LUNGS/PLEURA: Necrotic, cavitated spiculated 3.8 x 4.9 cm RIGHT upper lobe mass was 4.6 x 5.5 cm. Mass encases the RIGHT upper lobe pulmonary artery which remains patent. Narrowed RIGHT  upper lobe bronchus. Diffuse bronchial wall thickening. Focal RIGHT lower lobe bronchiectasis and mucoid impaction. Biapical bullous changes in RIGHT pleural thickening. Tree-in-bud infiltrates bilateral upper lobes, to lesser extent RIGHT middle RIGHT lower lobe. Scattered punctate calcified granulomas. Mild centrilobular emphysema. No pleural effusion.  UPPER ABDOMEN: Unchanged LEFT hydro nephrosis and  bilateral extrarenal pelvises. No adrenal mass.  MUSCULOSKELETAL: Visualized soft tissues and included osseous structures are nonacute . Skin thickening coarse calcifications RIGHT breast are improved. Old RIGHT posterior rib fractures. Surgical clips RIGHT axilla consistent with nodal dissection. Moderate canal stenosis C6-7 due to large disc osteophyte complex.  Review of the MIP images confirms the above findings.  IMPRESSION: 1. No acute pulmonary embolism. 2. Scattered tree-in-bud infiltrates may be infectious or inflammatory, lymphangitic spread of tumor not excluded. Diffuse bronchial wall thickening. Low 3. Smaller RIGHT upper lobe carcinoma, now necrotic/cavitated. Decreased size of nodal metastasis. 4. Chronic LEFT hydronephrosis.  Aortic Atherosclerosis (ICD10-I70.0) and Emphysema (ICD10-J43.9).   Electronically Signed   By: Elon Alas M.D.   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 Treated with concurrent chemoradiation with weekly Carbo/Taxol x 6 cycles; last dose of chemo 05/02/17.  Post-treatment CTA chest on 06/19/17 showed smaller RUL carcinoma, now necrotic/cavitated, also with decreased size of nodal mets.  PD-L1 negative (0%).  Transferred her care to [facility] in 06/2017.  Port-a-cath was placed on 07/06/17 by Dr. Constance Haw in [place].  Consolidation therapy with Imfinzi started 07/12/17.   -Due for next cycle of Imfinzi today. Labs reviewed and are adequate for treatment. However, given her clinical symptoms concerning for upper respiratory infection, will hold treatment today.  She prefers to hold treatment today as well (see below).  -Next restaging CT chest scheduled for 09/22/17; maintain imaging as scheduled.   -Return to cancer center next week to re-challenge current cycle of  Imfinzi and for follow-up visit.   Upper respiratory infection:  -Symptoms of sore throat and cough started ~1 week ago. ? Fevers (she did not check temperature), but has had chills and diaphoresis intermittently.  Could be common cold vs bacterial infection.  Given her lung cancer and comorbid lung disease with O2 requirement, I will treat empirically with Levaquin 750 mg x 5 days; Rx e-scribed to her pharmacy today.  Advised her to call me if she experiences any concerning symptoms while on antibiotic therapy.   At risk for endocrinopathies:  -Serial TSH values reviewed and have been within normal limits. Will continue to periodically monitor for thyroid dysfunction and other possible endocrinopathies while on immunotherapy.   History of right breast cancer:  -Diagnosed in 2005. Treated with lumpectomy, radiation, and chemotherapy per documentation.  -Last diagnostic (R) breast mammogram and ultrasound performed on 10/21/16 showed concerns for hypoechoic mass-like shadowing and peau d'orange with firmness on physical exam by radiologist.  -Underwent (R) breast biopsy at 2:00 position on 11/02/16 with pathology showing benign breast tissue with fat necrosis; no evidence of malignancy. Additional (R) breast biopsy performed by Dr. Arnoldo Morale on 12/29/16 again with no malignancy identified on pathology.  -Clinical breast exam performed today. There is a (R) breast mass appreciated in the sub-areolar region at ~7:30 position measuring ~1 cm. Mass is mobile; could be additional area of fat necrosis vs malignancy. This mass is not mentioned no previous  mammogram done in 10/2016 noting area of skin thickness and fat necrosis to the UIQ of right breast.  Given her h/o breast cancer, will obtain diagnostic mammogram and ultrasound for further evaluation.  Mammogram is currently scheduled for 10/2017; will ask our scheduling team to get this appt moved up so that this mass can be imaged sooner.    Addendum:    *Hypokalemia: K 3.0 today. She has only been taking K-Dur 3 times per week per her report. She has trouble swallowing the larger pills.  Therefore, I have recommended the following for her:    -K-Dur (10 mEq tabs since they are slightly smaller): Take 4 tabs po BID x 1 week (total 80 mEq per day).  Then take 4 tabs daily (or 2 tabs BID if that is easier for her); to total 40 mEq daily.      Dispo:  -Imfinzi held today.  -Return to cancer center next week for re-consideration of next cycle Imfinzi and follow-up visit.  -New right breast mass; diagnostic mammogram/US as soon as able.     All questions were answered to patient's stated satisfaction. Encouraged patient to call with any new concerns or questions before her next visit to the cancer center and we can certain see her sooner, if needed.      Orders placed this encounter:  Orders Placed This Encounter  Procedures  . MM DIAG BREAST TOMO BILATERAL  . US BREAST LTD UNI RIGHT INC AXILLA      Gretchen Dawson, NP [place] [phone]

## 2017-09-13 NOTE — Progress Notes (Signed)
Athena tolerated port lab draw well without complaints or incident. Labs reviewed with and pt seen by Mike Craze NP today and pt's chemo was held due to pt not feeling well. Chemo rescheduled for next week. K-DUR dose increased for low Potassium and antibiotic ordered per Mike Craze NP today. Pt verbalized understanding of how to take both of these medications. VSS Pt discharged self ambulatory in stable condition accompanied by a family member

## 2017-09-13 NOTE — Patient Instructions (Signed)
[place] at [facility] Discharge Instructions  RECOMMENDATIONS MADE BY THE CONSULTANT AND ANY TEST RESULTS WILL BE SENT TO YOUR REFERRING PHYSICIAN.  Labs drawn from portacath and flushed per protocol today. Chemo tx held and rescheduled for next wek . Follow-up as scheduled. Call clinic for any questions or concerns  Thank you for choosing [place] at [facility] to provide your oncology and hematology care.  To afford each patient quality time with our provider, please arrive at least 15 minutes before your scheduled appointment time.    If you have a lab appointment with the [facility] please come in thru the  Main Entrance and check in at the main information desk  You need to re-schedule your appointment should you arrive 10 or more minutes late.  We strive to give you quality time with our providers, and arriving late affects you and other patients whose appointments are after yours.  Also, if you no show three or more times for appointments you may be dismissed from the clinic at the providers discretion.     Again, thank you for choosing [facility].  Our hope is that these requests will decrease the amount of time that you wait before being seen by our physicians.       _____________________________________________________________  Should you have questions after your visit to [facility], please contact our office at [phone] between the hours of 8:30 a.m. and 4:30 p.m.  Voicemails left after 4:30 p.m. will not be returned until the following business day.  For prescription refill requests, have your pharmacy contact our office.       Resources For Cancer Patients and their Caregivers ? American Cancer Society: Can assist with transportation, wigs, general needs, runs Look Good Feel Better.        541-684-0835 ? Cancer Care: Provides financial assistance, online support groups,  medication/co-pay assistance.  1-800-813-HOPE 671-178-3275) ? [place] Assists [place] cancer patients and their families through emotional , educational and financial support.  615-399-9059 ? [place] DSS Where to apply for food stamps, Medicaid and utility assistance. 940-230-1259 ? [facility]: Transportation to medical appointments. 641-621-2022 ? Social Security Administration: May apply for disability if have a Stage IV cancer. 678-678-6855 (404) 596-5877 ? [employer], Disability and Transit Services: Assists with nutrition, care and transit needs. [place] Support Programs: @10RELATIVEDAYS @ > Cancer Support Group  2nd Tuesday of the month 1pm-2pm, Journey Room  > Creative Journey  3rd Tuesday of the month 1130am-1pm, Journey Room  > Look Good Feel Better  1st Wednesday of the month 10am-12 noon, Journey Room (Call [place] to register [phone])

## 2017-09-13 NOTE — Addendum Note (Signed)
Addended by: Holley Bouche on: 09/13/2017 01:24 PM   Modules accepted: Orders

## 2017-09-19 ENCOUNTER — Inpatient Hospital Stay (HOSPITAL_COMMUNITY): Payer: Medicaid Other

## 2017-09-19 ENCOUNTER — Inpatient Hospital Stay (HOSPITAL_COMMUNITY): Payer: Medicaid Other | Admitting: Internal Medicine

## 2017-09-19 ENCOUNTER — Encounter (HOSPITAL_COMMUNITY): Payer: Self-pay

## 2017-09-19 DIAGNOSIS — C3411 Malignant neoplasm of upper lobe, right bronchus or lung: Secondary | ICD-10-CM | POA: Diagnosis not present

## 2017-09-19 DIAGNOSIS — C3490 Malignant neoplasm of unspecified part of unspecified bronchus or lung: Secondary | ICD-10-CM

## 2017-09-19 LAB — COMPREHENSIVE METABOLIC PANEL
ALBUMIN: 3.2 g/dL — AB (ref 3.5–5.0)
ALK PHOS: 81 U/L (ref 38–126)
ALT: 8 U/L — AB (ref 14–54)
ANION GAP: 11 (ref 5–15)
AST: 14 U/L — ABNORMAL LOW (ref 15–41)
BUN: 7 mg/dL (ref 6–20)
CALCIUM: 8.7 mg/dL — AB (ref 8.9–10.3)
CO2: 28 mmol/L (ref 22–32)
CREATININE: 0.63 mg/dL (ref 0.44–1.00)
Chloride: 95 mmol/L — ABNORMAL LOW (ref 101–111)
GFR calc Af Amer: >60 mL/min (ref >60)
GFR calc non Af Amer: >60 mL/min (ref >60)
GLUCOSE: 90 mg/dL (ref 65–99)
Potassium: 3.3 mmol/L — ABNORMAL LOW (ref 3.5–5.1)
Sodium: 134 mmol/L — ABNORMAL LOW (ref 135–145)
TOTAL PROTEIN: 7.4 g/dL (ref 6.5–8.1)
Total Bilirubin: 0.4 mg/dL (ref 0.3–1.2)

## 2017-09-19 LAB — CBC WITH DIFFERENTIAL/PLATELET
BASOS ABS: 0.1 10*3/uL (ref 0.0–0.1)
Basophils Relative: 1 %
EOS ABS: 0.3 10*3/uL (ref 0.0–0.7)
EOS PCT: 3 %
HCT: 37.3 % (ref 36.0–46.0)
Hemoglobin: 11.7 g/dL — ABNORMAL LOW (ref 12.0–15.0)
Lymphocytes Relative: 11 %
Lymphs Abs: 1.1 10*3/uL (ref 0.7–4.0)
MCH: 27.2 pg (ref 26.0–34.0)
MCHC: 31.4 g/dL (ref 30.0–36.0)
MCV: 86.7 fL (ref 78.0–100.0)
MONO ABS: 0.8 10*3/uL (ref 0.1–1.0)
MONOS PCT: 8 %
Neutro Abs: 7.9 10*3/uL — ABNORMAL HIGH (ref 1.7–7.7)
Neutrophils Relative %: 77 %
PLATELETS: 257 10*3/uL (ref 150–400)
RBC: 4.3 MIL/uL (ref 3.87–5.11)
RDW: 14.6 % (ref 11.5–15.5)
WBC: 10.2 10*3/uL (ref 4.0–10.5)

## 2017-09-19 MED ORDER — SODIUM CHLORIDE 0.9% FLUSH
10.0000 mL | Freq: Once | INTRAVENOUS | Status: AC
  Administered 2017-09-19: 10 mL via INTRAVENOUS

## 2017-09-19 MED ORDER — HEPARIN SOD (PORK) LOCK FLUSH 100 UNIT/ML IV SOLN
500.0000 [IU] | Freq: Once | INTRAVENOUS | Status: AC
  Administered 2017-09-19: 500 [IU] via INTRAVENOUS

## 2017-09-19 NOTE — Progress Notes (Signed)
To treatment area for oncology and treatment.  Patient stated neuropathy in fingers and toes that comes from fibromyalgia per patient.  Managed with gabapentin.  SOB with exertion and uses oxygen.  Reviewed and updated medications.  Denied bowel or bladder problems.  Good appetite and fair energy level.   Stated shoulder and back pain rated 8 or 9 on scale and medication at home for relief.  Also, stated generalized discomfort all over.  No s/s of distress noted.   Patients treatment held due to upcoming CT scan this week.  Scheduled to be seen after scan and treatment with follow up visit on September 23, 2017.  Patient verbalized understanding.  Port de accessed per protocol.  Site clean and dry with no bruising or swelling noted at site.  Band aid applied and discharged with no s/s of distress noted.

## 2017-09-19 NOTE — Progress Notes (Signed)
Patient's treatment to be held today. Scans on 09/22/17 and mammogram on 1/29. Treatment to resume on Friday and discussion of CT and mammo results to occur on Friday with Dr. Walden Field. Patient aware. Appts given.

## 2017-09-19 NOTE — Patient Instructions (Signed)
[place] at [facility]  Discharge Instructions:  To be treated on February 1st after CT scan.   _______________________________________________________________  Thank you for choosing [place] at [facility] to provide your oncology and hematology care.  To afford each patient quality time with our providers, please arrive at least 15 minutes before your scheduled appointment.  You need to re-schedule your appointment if you arrive 10 or more minutes late.  We strive to give you quality time with our providers, and arriving late affects you and other patients whose appointments are after yours.  Also, if you no show three or more times for appointments you may be dismissed from the clinic.  Again, thank you for choosing [place] at [place] hope is that these requests will allow you access to exceptional care and in a timely manner. _______________________________________________________________  If you have questions after your visit, please contact our office at [phone] between the hours of 8:30 a.m. and 5:00 p.m. Voicemails left after 4:30 p.m. will not be returned until the following business day. _______________________________________________________________  For prescription refill requests, have your pharmacy contact our office. _______________________________________________________________  Recommendations made by the consultant and any test results will be sent to your referring physician. _______________________________________________________________

## 2017-09-20 ENCOUNTER — Ambulatory Visit (HOSPITAL_COMMUNITY)
Admission: RE | Admit: 2017-09-20 | Discharge: 2017-09-20 | Disposition: A | Discharge status: Home / Self Care | Payer: Medicaid Other | Source: Ambulatory Visit | Attending: Oncology | Admitting: Oncology

## 2017-09-20 DIAGNOSIS — C3491 Malignant neoplasm of unspecified part of right bronchus or lung: Secondary | ICD-10-CM | POA: Diagnosis present

## 2017-09-20 DIAGNOSIS — R921 Mammographic calcification found on diagnostic imaging of breast: Secondary | ICD-10-CM | POA: Diagnosis not present

## 2017-09-22 ENCOUNTER — Ambulatory Visit (HOSPITAL_COMMUNITY)
Admission: RE | Admit: 2017-09-22 | Discharge: 2017-09-22 | Disposition: A | Discharge status: Home / Self Care | Payer: Medicaid Other | Source: Ambulatory Visit | Attending: Oncology | Admitting: Oncology

## 2017-09-22 DIAGNOSIS — C3491 Malignant neoplasm of unspecified part of right bronchus or lung: Secondary | ICD-10-CM | POA: Diagnosis present

## 2017-09-22 DIAGNOSIS — J439 Emphysema, unspecified: Secondary | ICD-10-CM | POA: Diagnosis not present

## 2017-09-22 DIAGNOSIS — I700 Atherosclerosis of aorta: Secondary | ICD-10-CM | POA: Insufficient documentation

## 2017-09-22 DIAGNOSIS — J90 Pleural effusion, not elsewhere classified: Secondary | ICD-10-CM | POA: Diagnosis not present

## 2017-09-22 DIAGNOSIS — I2510 Atherosclerotic heart disease of native coronary artery without angina pectoris: Secondary | ICD-10-CM | POA: Diagnosis not present

## 2017-09-22 DIAGNOSIS — Z923 Personal history of irradiation: Secondary | ICD-10-CM | POA: Diagnosis not present

## 2017-09-22 DIAGNOSIS — J219 Acute bronchiolitis, unspecified: Secondary | ICD-10-CM | POA: Diagnosis not present

## 2017-09-22 MED ORDER — IOPAMIDOL (ISOVUE-300) INJECTION 61%
75.0000 mL | Freq: Once | INTRAVENOUS | Status: AC | PRN
  Administered 2017-09-22: 75 mL via INTRAVENOUS

## 2017-09-23 ENCOUNTER — Ambulatory Visit (HOSPITAL_COMMUNITY): Payer: Medicaid Other | Admitting: Internal Medicine

## 2017-09-23 ENCOUNTER — Inpatient Hospital Stay (HOSPITAL_BASED_OUTPATIENT_CLINIC_OR_DEPARTMENT_OTHER): Payer: Medicaid Other | Admitting: Internal Medicine

## 2017-09-23 ENCOUNTER — Encounter (HOSPITAL_COMMUNITY): Payer: Self-pay

## 2017-09-23 ENCOUNTER — Inpatient Hospital Stay (HOSPITAL_COMMUNITY): Payer: Medicaid Other | Attending: Internal Medicine

## 2017-09-23 VITALS — BP 138/68 | HR 89 | Temp 97.5°F | Resp 20 | Wt 149.4 lb

## 2017-09-23 DIAGNOSIS — G620 Drug-induced polyneuropathy: Secondary | ICD-10-CM | POA: Diagnosis not present

## 2017-09-23 DIAGNOSIS — C3490 Malignant neoplasm of unspecified part of unspecified bronchus or lung: Secondary | ICD-10-CM

## 2017-09-23 DIAGNOSIS — J84115 Respiratory bronchiolitis interstitial lung disease: Secondary | ICD-10-CM

## 2017-09-23 DIAGNOSIS — J069 Acute upper respiratory infection, unspecified: Secondary | ICD-10-CM

## 2017-09-23 DIAGNOSIS — Z5112 Encounter for antineoplastic immunotherapy: Secondary | ICD-10-CM | POA: Insufficient documentation

## 2017-09-23 DIAGNOSIS — Z853 Personal history of malignant neoplasm of breast: Secondary | ICD-10-CM | POA: Diagnosis not present

## 2017-09-23 DIAGNOSIS — C3411 Malignant neoplasm of upper lobe, right bronchus or lung: Secondary | ICD-10-CM

## 2017-09-23 DIAGNOSIS — J439 Emphysema, unspecified: Secondary | ICD-10-CM

## 2017-09-23 DIAGNOSIS — C3491 Malignant neoplasm of unspecified part of right bronchus or lung: Secondary | ICD-10-CM

## 2017-09-23 DIAGNOSIS — Z9221 Personal history of antineoplastic chemotherapy: Secondary | ICD-10-CM

## 2017-09-23 DIAGNOSIS — Z923 Personal history of irradiation: Secondary | ICD-10-CM

## 2017-09-23 DIAGNOSIS — C50911 Malignant neoplasm of unspecified site of right female breast: Secondary | ICD-10-CM

## 2017-09-23 LAB — COMPREHENSIVE METABOLIC PANEL
ALBUMIN: 3.2 g/dL — AB (ref 3.5–5.0)
ALT: 8 U/L — AB (ref 14–54)
AST: 12 U/L — AB (ref 15–41)
Alkaline Phosphatase: 72 U/L (ref 38–126)
Anion gap: 12 (ref 5–15)
BUN: 8 mg/dL (ref 6–20)
CHLORIDE: 95 mmol/L — AB (ref 101–111)
CO2: 29 mmol/L (ref 22–32)
CREATININE: 0.6 mg/dL (ref 0.44–1.00)
Calcium: 8.6 mg/dL — ABNORMAL LOW (ref 8.9–10.3)
GFR calc Af Amer: >60 mL/min (ref >60)
GFR calc non Af Amer: >60 mL/min (ref >60)
GLUCOSE: 93 mg/dL (ref 65–99)
Potassium: 3.4 mmol/L — ABNORMAL LOW (ref 3.5–5.1)
SODIUM: 136 mmol/L (ref 135–145)
Total Bilirubin: 0.5 mg/dL (ref 0.3–1.2)
Total Protein: 7.4 g/dL (ref 6.5–8.1)

## 2017-09-23 LAB — CBC WITH DIFFERENTIAL/PLATELET
Basophils Absolute: 0.1 10*3/uL (ref 0.0–0.1)
Basophils Relative: 1 %
EOS PCT: 3 %
Eosinophils Absolute: 0.3 10*3/uL (ref 0.0–0.7)
HCT: 38.3 % (ref 36.0–46.0)
HEMOGLOBIN: 11.8 g/dL — AB (ref 12.0–15.0)
LYMPHS ABS: 1.1 10*3/uL (ref 0.7–4.0)
Lymphocytes Relative: 11 %
MCH: 26.9 pg (ref 26.0–34.0)
MCHC: 30.8 g/dL (ref 30.0–36.0)
MCV: 87.4 fL (ref 78.0–100.0)
MONO ABS: 0.7 10*3/uL (ref 0.1–1.0)
MONOS PCT: 7 %
NEUTROS PCT: 78 %
Neutro Abs: 8 10*3/uL — ABNORMAL HIGH (ref 1.7–7.7)
Platelets: 264 10*3/uL (ref 150–400)
RBC: 4.38 MIL/uL (ref 3.87–5.11)
RDW: 14.5 % (ref 11.5–15.5)
WBC: 10.1 10*3/uL (ref 4.0–10.5)

## 2017-09-23 LAB — INFLUENZA PANEL BY PCR (TYPE A & B)
Influenza A By PCR: NEGATIVE
Influenza B By PCR: NEGATIVE

## 2017-09-23 LAB — TSH: TSH: 0.296 u[IU]/mL — ABNORMAL LOW (ref 0.350–4.500)

## 2017-09-23 MED ORDER — SODIUM CHLORIDE 0.9 % IV SOLN
Freq: Once | INTRAVENOUS | Status: AC
  Administered 2017-09-23: 11:00:00 via INTRAVENOUS

## 2017-09-23 MED ORDER — SODIUM CHLORIDE 0.9 % IV SOLN
620.0000 mg | Freq: Once | INTRAVENOUS | Status: AC
  Administered 2017-09-23: 620 mg via INTRAVENOUS
  Filled 2017-09-23: qty 2.4

## 2017-09-23 MED ORDER — HEPARIN SOD (PORK) LOCK FLUSH 100 UNIT/ML IV SOLN
500.0000 [IU] | Freq: Once | INTRAVENOUS | Status: AC | PRN
  Administered 2017-09-23: 500 [IU]
  Filled 2017-09-23: qty 5

## 2017-09-23 MED ORDER — AZITHROMYCIN 500 MG PO TABS: 500.0000 mg | ORAL_TABLET | Freq: Every day | ORAL | 0 refills | Status: AC

## 2017-09-23 MED ORDER — SODIUM CHLORIDE 0.9% FLUSH
10.0000 mL | INTRAVENOUS | Status: DC | PRN
  Administered 2017-09-23: 10 mL
  Filled 2017-09-23: qty 10

## 2017-09-23 NOTE — Progress Notes (Signed)
To treatment area for labs, oncology follow up, and treatment.  Stated good appetite and fair energy level.  Sore throat with swallowing but no sinus drainage, fever or chills.  Back and shoulder and neck discomfort and rated 8 on scale that is on going. Uses pain medications at home.  No changes in finger and toe neuropathy.  Able to perform ADL's, open jar lids, use zippers, and pick up money change.  Labs repeated per Dr. Sherrine Maples.   Ok to treat per Dr. Sherrine Maples.   Patient tolerated chemotherapy with no complaints voiced.  Port site clean and dry with no bruising or swelling noted at site.  Band aid applied.  VSS with discharge and left ambulatory with friend.  No s/s of distress noted.

## 2017-09-23 NOTE — Progress Notes (Signed)
Chief complaint: Follow-up for stage IIIa non-small cell lung cancer squamous cell variety T2b N2 M0  Personal history of breast cancer - 2005  HPI; She presented in July 2018 with large right upper lobe mass with right hilar and mediastinal adenopathy.  PDL 1 expression was negative. She received concurrent chemoradiation with weekly carboplatin and paclitaxel for 6 cycles ending in September 2018. She is currently on consolidation with [facility] since November 2018.  She received 4 doses so far,  last dose was given on January 3  Interval History: She was last seen on September 13, 2017 by nurse practitioner Mike Craze when she presented with upper respiratory tract infection and bronchitis she was treated with antibiotics Levaquin 750 mg for 5 days. Patient feels slightly better with the cough but she cannot she still has some residual greenish yellow expectoration.  Her sore throat has improved no nasal drainage.  No fevers.  She feels weak but no change in her respiratory status She also has chronic shoulder and back pain.She has peripheral neuropathy that is managed with gabapentin this is chronic and stable. Past Medical History:  Diagnosis Date  . Anxiety   . ARF (acute renal failure) ([place]) 08/07/2011   ?   [place] Kitchen Arthritis   . Cancer ([place]) 2005   breast-right  . Chronic back pain   . COPD (chronic obstructive pulmonary disease) ([place])   . Depression   . Depression with anxiety   . Dyspnea    on exertion  . Fibromyalgia   . GERD (gastroesophageal reflux disease)   . History of radiation therapy 03/24/17-05/12/17   right lun 2 Gy in 30 fractions  . Hypercholesteremia   . Hypothyroidism   . Hypothyroidism 06/19/2017  . Incontinence   . Neuropathy   . Peripheral neuropathy   . Sciatic pain   . Sebaceous cyst    lt labial location  . Sleep apnea    uses CPAP  . Stage III squamous cell carcinoma of right lung ([place]) 03/10/2017  . Tobacco abuse    Result review:  A repeat CT  chest was performed on 09/22/2017 that has been reviewed. Report as follows:  1. Significantly worsened right upper and superior segment right lower lobe aeration with airspace and ground-glass opacity with septal thickening. This is presumably radiation induced. Concurrent lymphangitic tumor spread cannot be excluded. 2. Given the surrounding worsened aeration, direct evaluation of primary lesion size is challenging. Given this difficulty, the right  upper lobe primary is felt to be enlarged. 3. Enlargement of a right paratracheal node, suspicious for nodal metastasis. 4. New small right pleural effusion. 5. Pulmonary artery enlargement suggests pulmonary arterial hypertension. 6. Aortic atherosclerosis (ICD10-I70.0), coronary artery atherosclerosis and emphysema (ICD10-J43.9). 7. Significantly improved tree-in-bud nodularity, likely due to nearly completely resolved infectious bronchiolitis.  -CBC today shows a hemoglobin 11.8 stable. -CMP shows mild hypokalemia potassium 3.4 otherwise unremarkable  Exam:  Her vitals are stable.Oxygen saturation is normal at 91% even though her respirations are 26 when she arrived. It is imrpoved to 18 now. AAOX3, mild distress. Prolonged expiration. Rhonchi heard scattered. Dullness to percussion R infraclavicular area. Heart rhythm is regular. Abdomen is soft. No extremity edema  Impression and Plan: 1- Adenocarcinoma Lung: Reviewed t the CT scan with the patient.  I suspect that the changes within the airspace around the right upper lobe primary lesion that was radiated represent infectious infiltrates rather than progression of the tumor.  -Her symptoms of cough with expectoration and sore throat,  Are suspicious for a viral or a bacterial pneumonia. -Clinically she is improved, but only partially. I recommended extending the course of antibiotic. I would prescribe Zithromax 500 mg once a day for 10 days. - We will get respiratory upper  respiratory virus panel .  -We will plan on repeating the CT scan to confirm resolution in approximately 6-8 weeks.    - We will proceed with IMFINZI she  Is due today. - She will return for follow-up and next infusion in 2 weeks. - she was advised to proceed to ED if symptoms get worse.  2. She has a remote history of right breast cancer.  Diagnosed in 2005 treated with lumpectomy radiation.  And adjuvant chemotherapy.  Details of treatment and pathology have not been reviewed today.Next mammogram is due in March 2019.

## 2017-09-23 NOTE — Patient Instructions (Signed)
[facility] Discharge Instructions for Patients Receiving Chemotherapy  Today you received the following chemotherapy agents infinzi today.  Call for any questions or concerns.    If you develop nausea and vomiting that is not controlled by your nausea medication, call the clinic.   BELOW ARE SYMPTOMS THAT SHOULD BE REPORTED IMMEDIATELY:  *FEVER GREATER THAN 100.5 F  *CHILLS WITH OR WITHOUT FEVER  NAUSEA AND VOMITING THAT IS NOT CONTROLLED WITH YOUR NAUSEA MEDICATION  *UNUSUAL SHORTNESS OF BREATH  *UNUSUAL BRUISING OR BLEEDING  TENDERNESS IN MOUTH AND THROAT WITH OR WITHOUT PRESENCE OF ULCERS  *URINARY PROBLEMS  *BOWEL PROBLEMS  UNUSUAL RASH Items with * indicate a potential emergency and should be followed up as soon as possible.  Feel free to call the clinic should you have any questions or concerns. The clinic phone number is [phone].  Please show the [facility] at check-in to the Emergency Department and triage nurse.

## 2017-09-27 ENCOUNTER — Ambulatory Visit (HOSPITAL_COMMUNITY): Payer: Medicaid Other | Admitting: Adult Health

## 2017-09-27 ENCOUNTER — Ambulatory Visit (HOSPITAL_COMMUNITY): Payer: Medicaid Other

## 2017-09-28 ENCOUNTER — Other Ambulatory Visit (HOSPITAL_COMMUNITY): Payer: Self-pay | Admitting: Internal Medicine

## 2017-09-28 DIAGNOSIS — C3491 Malignant neoplasm of unspecified part of right bronchus or lung: Secondary | ICD-10-CM

## 2017-09-29 ENCOUNTER — Other Ambulatory Visit (HOSPITAL_COMMUNITY): Payer: Self-pay | Admitting: *Deleted

## 2017-09-29 DIAGNOSIS — C50911 Malignant neoplasm of unspecified site of right female breast: Secondary | ICD-10-CM

## 2017-10-03 ENCOUNTER — Ambulatory Visit (HOSPITAL_COMMUNITY): Payer: Medicaid Other

## 2017-10-07 ENCOUNTER — Ambulatory Visit (HOSPITAL_COMMUNITY): Payer: Medicaid Other | Admitting: Internal Medicine

## 2017-10-07 ENCOUNTER — Ambulatory Visit (HOSPITAL_COMMUNITY): Payer: Medicaid Other

## 2017-10-11 ENCOUNTER — Encounter (HOSPITAL_COMMUNITY): Payer: Self-pay

## 2017-10-11 ENCOUNTER — Inpatient Hospital Stay (HOSPITAL_COMMUNITY): Payer: Medicaid Other

## 2017-10-11 ENCOUNTER — Other Ambulatory Visit: Payer: Self-pay

## 2017-10-11 ENCOUNTER — Encounter: Payer: Self-pay | Admitting: Oncology

## 2017-10-11 VITALS — BP 126/67 | HR 76 | Temp 97.7°F | Resp 18 | Wt 143.8 lb

## 2017-10-11 DIAGNOSIS — Z5112 Encounter for antineoplastic immunotherapy: Secondary | ICD-10-CM | POA: Diagnosis not present

## 2017-10-11 DIAGNOSIS — C3491 Malignant neoplasm of unspecified part of right bronchus or lung: Secondary | ICD-10-CM

## 2017-10-11 DIAGNOSIS — C3490 Malignant neoplasm of unspecified part of unspecified bronchus or lung: Secondary | ICD-10-CM

## 2017-10-11 LAB — CBC WITH DIFFERENTIAL/PLATELET
Basophils Absolute: 0.1 10*3/uL (ref 0.0–0.1)
Basophils Relative: 1 %
EOS ABS: 0.2 10*3/uL (ref 0.0–0.7)
Eosinophils Relative: 2 %
HEMATOCRIT: 41.1 % (ref 36.0–46.0)
HEMOGLOBIN: 12.4 g/dL (ref 12.0–15.0)
LYMPHS PCT: 10 %
Lymphs Abs: 0.9 10*3/uL (ref 0.7–4.0)
MCH: 26.3 pg (ref 26.0–34.0)
MCHC: 30.2 g/dL (ref 30.0–36.0)
MCV: 87.1 fL (ref 78.0–100.0)
Monocytes Absolute: 0.7 10*3/uL (ref 0.1–1.0)
Monocytes Relative: 8 %
NEUTROS ABS: 6.9 10*3/uL (ref 1.7–7.7)
NEUTROS PCT: 79 %
Platelets: 241 10*3/uL (ref 150–400)
RBC: 4.72 MIL/uL (ref 3.87–5.11)
RDW: 15.6 % — ABNORMAL HIGH (ref 11.5–15.5)
WBC: 8.6 10*3/uL (ref 4.0–10.5)

## 2017-10-11 LAB — COMPREHENSIVE METABOLIC PANEL
ALK PHOS: 85 U/L (ref 38–126)
ALT: 6 U/L — AB (ref 14–54)
AST: 11 U/L — ABNORMAL LOW (ref 15–41)
Albumin: 3.3 g/dL — ABNORMAL LOW (ref 3.5–5.0)
Anion gap: 11 (ref 5–15)
BUN: 7 mg/dL (ref 6–20)
CALCIUM: 9 mg/dL (ref 8.9–10.3)
CO2: 27 mmol/L (ref 22–32)
CREATININE: 0.59 mg/dL (ref 0.44–1.00)
Chloride: 98 mmol/L — ABNORMAL LOW (ref 101–111)
GFR calc Af Amer: >60 mL/min (ref >60)
GFR calc non Af Amer: >60 mL/min (ref >60)
Glucose, Bld: 91 mg/dL (ref 65–99)
Potassium: 4.1 mmol/L (ref 3.5–5.1)
SODIUM: 136 mmol/L (ref 135–145)
Total Bilirubin: 0.3 mg/dL (ref 0.3–1.2)
Total Protein: 7.7 g/dL (ref 6.5–8.1)

## 2017-10-11 LAB — T4, FREE: FREE T4: 1.16 ng/dL — AB (ref 0.61–1.12)

## 2017-10-11 LAB — TSH: TSH: 0.222 u[IU]/mL — ABNORMAL LOW (ref 0.350–4.500)

## 2017-10-11 MED ORDER — SODIUM CHLORIDE 0.9 % IV SOLN
620.0000 mg | Freq: Once | INTRAVENOUS | Status: AC
  Administered 2017-10-11: 620 mg via INTRAVENOUS
  Filled 2017-10-11: qty 10

## 2017-10-11 MED ORDER — HEPARIN SOD (PORK) LOCK FLUSH 100 UNIT/ML IV SOLN
500.0000 [IU] | Freq: Once | INTRAVENOUS | Status: AC | PRN
  Administered 2017-10-11: 500 [IU]

## 2017-10-11 MED ORDER — SODIUM CHLORIDE 0.9 % IV SOLN
Freq: Once | INTRAVENOUS | Status: AC
  Administered 2017-10-11: 14:00:00 via INTRAVENOUS

## 2017-10-11 NOTE — Progress Notes (Signed)
Tolerated infusion w/o adverse reaction.  Alert, in no distress.  VSS.  Discharged ambulatory in c/o friend.

## 2017-10-12 LAB — T3: T3, Total: 115 ng/dL (ref 71–180)

## 2017-10-14 ENCOUNTER — Ambulatory Visit (HOSPITAL_COMMUNITY): Payer: Medicaid Other

## 2017-10-17 ENCOUNTER — Ambulatory Visit (HOSPITAL_COMMUNITY): Payer: Medicaid Other

## 2017-10-21 ENCOUNTER — Ambulatory Visit (HOSPITAL_COMMUNITY): Payer: Medicaid Other

## 2017-10-25 ENCOUNTER — Ambulatory Visit (HOSPITAL_COMMUNITY)
Admission: RE | Admit: 2017-10-25 | Discharge: 2017-10-25 | Disposition: A | Discharge status: Home / Self Care | Payer: Medicaid Other | Source: Ambulatory Visit | Attending: Internal Medicine | Admitting: Internal Medicine

## 2017-10-25 ENCOUNTER — Encounter (HOSPITAL_COMMUNITY): Payer: Self-pay | Admitting: Internal Medicine

## 2017-10-25 ENCOUNTER — Inpatient Hospital Stay (HOSPITAL_BASED_OUTPATIENT_CLINIC_OR_DEPARTMENT_OTHER): Payer: Medicaid Other | Admitting: Internal Medicine

## 2017-10-25 ENCOUNTER — Encounter (HOSPITAL_COMMUNITY): Payer: Medicaid Other

## 2017-10-25 ENCOUNTER — Other Ambulatory Visit: Payer: Self-pay

## 2017-10-25 ENCOUNTER — Inpatient Hospital Stay (HOSPITAL_COMMUNITY): Payer: Medicaid Other | Attending: Internal Medicine

## 2017-10-25 VITALS — BP 108/56 | HR 83 | Temp 97.8°F | Resp 20 | Wt 147.8 lb

## 2017-10-25 DIAGNOSIS — C3411 Malignant neoplasm of upper lobe, right bronchus or lung: Secondary | ICD-10-CM

## 2017-10-25 DIAGNOSIS — J90 Pleural effusion, not elsewhere classified: Secondary | ICD-10-CM

## 2017-10-25 DIAGNOSIS — Z853 Personal history of malignant neoplasm of breast: Secondary | ICD-10-CM | POA: Diagnosis not present

## 2017-10-25 DIAGNOSIS — J189 Pneumonia, unspecified organism: Secondary | ICD-10-CM | POA: Diagnosis present

## 2017-10-25 DIAGNOSIS — Z9221 Personal history of antineoplastic chemotherapy: Secondary | ICD-10-CM | POA: Insufficient documentation

## 2017-10-25 DIAGNOSIS — R059 Cough, unspecified: Secondary | ICD-10-CM

## 2017-10-25 DIAGNOSIS — F17210 Nicotine dependence, cigarettes, uncomplicated: Secondary | ICD-10-CM | POA: Insufficient documentation

## 2017-10-25 DIAGNOSIS — R05 Cough: Secondary | ICD-10-CM

## 2017-10-25 DIAGNOSIS — R918 Other nonspecific abnormal finding of lung field: Secondary | ICD-10-CM | POA: Diagnosis not present

## 2017-10-25 DIAGNOSIS — R0989 Other specified symptoms and signs involving the circulatory and respiratory systems: Secondary | ICD-10-CM

## 2017-10-25 DIAGNOSIS — Z923 Personal history of irradiation: Secondary | ICD-10-CM | POA: Insufficient documentation

## 2017-10-25 DIAGNOSIS — C3490 Malignant neoplasm of unspecified part of unspecified bronchus or lung: Secondary | ICD-10-CM

## 2017-10-25 DIAGNOSIS — C3491 Malignant neoplasm of unspecified part of right bronchus or lung: Secondary | ICD-10-CM

## 2017-10-25 DIAGNOSIS — Z9223 Personal history of estrogen therapy: Secondary | ICD-10-CM | POA: Diagnosis not present

## 2017-10-25 DIAGNOSIS — Z5112 Encounter for antineoplastic immunotherapy: Secondary | ICD-10-CM | POA: Insufficient documentation

## 2017-10-25 DIAGNOSIS — C50911 Malignant neoplasm of unspecified site of right female breast: Secondary | ICD-10-CM

## 2017-10-25 DIAGNOSIS — E039 Hypothyroidism, unspecified: Secondary | ICD-10-CM | POA: Diagnosis not present

## 2017-10-25 LAB — CBC WITH DIFFERENTIAL/PLATELET
BASOS ABS: 0.1 10*3/uL (ref 0.0–0.1)
BASOS PCT: 1 %
EOS PCT: 3 %
Eosinophils Absolute: 0.2 10*3/uL (ref 0.0–0.7)
HCT: 42.3 % (ref 36.0–46.0)
Hemoglobin: 12.7 g/dL (ref 12.0–15.0)
LYMPHS PCT: 12 %
Lymphs Abs: 1 10*3/uL (ref 0.7–4.0)
MCH: 26.3 pg (ref 26.0–34.0)
MCHC: 30 g/dL (ref 30.0–36.0)
MCV: 87.6 fL (ref 78.0–100.0)
Monocytes Absolute: 0.5 10*3/uL (ref 0.1–1.0)
Monocytes Relative: 5 %
NEUTROS ABS: 6.8 10*3/uL (ref 1.7–7.7)
Neutrophils Relative %: 79 %
Platelets: 230 10*3/uL (ref 150–400)
RBC: 4.83 MIL/uL (ref 3.87–5.11)
RDW: 16 % — ABNORMAL HIGH (ref 11.5–15.5)
WBC: 8.5 10*3/uL (ref 4.0–10.5)

## 2017-10-25 LAB — COMPREHENSIVE METABOLIC PANEL
ALT: 6 U/L — ABNORMAL LOW (ref 14–54)
ANION GAP: 9 (ref 5–15)
AST: 14 U/L — ABNORMAL LOW (ref 15–41)
Albumin: 3.5 g/dL (ref 3.5–5.0)
Alkaline Phosphatase: 88 U/L (ref 38–126)
BILIRUBIN TOTAL: 0.5 mg/dL (ref 0.3–1.2)
BUN: 7 mg/dL (ref 6–20)
CO2: 29 mmol/L (ref 22–32)
Calcium: 8.7 mg/dL — ABNORMAL LOW (ref 8.9–10.3)
Chloride: 95 mmol/L — ABNORMAL LOW (ref 101–111)
Creatinine, Ser: 0.66 mg/dL (ref 0.44–1.00)
GFR calc Af Amer: >60 mL/min (ref >60)
Glucose, Bld: 101 mg/dL — ABNORMAL HIGH (ref 65–99)
Potassium: 3.8 mmol/L (ref 3.5–5.1)
Sodium: 133 mmol/L — ABNORMAL LOW (ref 135–145)
TOTAL PROTEIN: 7.9 g/dL (ref 6.5–8.1)

## 2017-10-25 MED ORDER — SODIUM CHLORIDE 0.9 % IV SOLN
20.0000 mg | Freq: Once | INTRAVENOUS | Status: AC
  Administered 2017-10-25: 20 mg via INTRAVENOUS
  Filled 2017-10-25: qty 2

## 2017-10-25 MED ORDER — SODIUM CHLORIDE 0.9 % IV SOLN
INTRAVENOUS | Status: DC
Start: 2017-10-25 — End: 2017-10-25
  Administered 2017-10-25: 13:00:00 via INTRAVENOUS

## 2017-10-25 MED ORDER — HEPARIN SOD (PORK) LOCK FLUSH 100 UNIT/ML IV SOLN
500.0000 [IU] | Freq: Once | INTRAVENOUS | Status: AC
  Administered 2017-10-25: 500 [IU] via INTRAVENOUS

## 2017-10-25 MED ORDER — SODIUM CHLORIDE 0.9% FLUSH
10.0000 mL | INTRAVENOUS | Status: DC | PRN
  Administered 2017-10-25: 10 mL via INTRAVENOUS
  Filled 2017-10-25: qty 10

## 2017-10-25 NOTE — Patient Instructions (Signed)
[place] at [facility] Discharge Instructions  Received Decadron infusion only today. Chemo tx held per MD. Follow-up as scheduled. Call clinic for any questions or concerns   Thank you for choosing [place] at [facility] to provide your oncology and hematology care.  To afford each patient quality time with our provider, please arrive at least 15 minutes before your scheduled appointment time.   If you have a lab appointment with the [facility] please come in thru the  Main Entrance and check in at the main information desk  You need to re-schedule your appointment should you arrive 10 or more minutes late.  We strive to give you quality time with our providers, and arriving late affects you and other patients whose appointments are after yours.  Also, if you no show three or more times for appointments you may be dismissed from the clinic at the providers discretion.     Again, thank you for choosing [facility].  Our hope is that these requests will decrease the amount of time that you wait before being seen by our physicians.       _____________________________________________________________  Should you have questions after your visit to [facility], please contact our office at [phone] between the hours of 8:30 a.m. and 4:30 p.m.  Voicemails left after 4:30 p.m. will not be returned until the following business day.  For prescription refill requests, have your pharmacy contact our office.       Resources For Cancer Patients and their Caregivers ? American Cancer Society: Can assist with transportation, wigs, general needs, runs Look Good Feel Better.        660-658-4026 ? Cancer Care: Provides financial assistance, online support groups, medication/co-pay assistance.  1-800-813-HOPE 262-427-0486) ? [place] Assists [place] cancer patients and their families through  emotional , educational and financial support.  (952)049-4631 ? [place] DSS Where to apply for food stamps, Medicaid and utility assistance. 9494024974 ? [employer]: Transportation to medical appointments. 5134938939 ? Social Security Administration: May apply for disability if have a Stage IV cancer. 719-813-3932 249-088-2619 ? [employer], Disability and Transit Services: Assists with nutrition, care and transit needs. [place] Support Programs:   > Cancer Support Group  2nd Tuesday of the month 1pm-2pm, Journey Room   > Creative Journey  3rd Tuesday of the month 1130am-1pm, Journey Room

## 2017-10-25 NOTE — Patient Instructions (Addendum)
[place] at [facility] Discharge Instructions   You were seen today by Dr. Zoila Shutter. Dr. Walden Field went over last scan and discussed you getting a PET scan. We are going to hold your treatment today.  We are going to get you scheduled for a PET scan in the next week or so.  We will follow up with you and review your PET scan results a few days after your scan.  Dr.Higgs wants you to have Decadron today.  Please try to cut back on your smoking.    Thank you for choosing [place] at [facility] to provide your oncology and hematology care.  To afford each patient quality time with our provider, please arrive at least 15 minutes before your scheduled appointment time.    If you have a lab appointment with the [facility] please come in thru the  Main Entrance and check in at the main information desk  You need to re-schedule your appointment should you arrive 10 or more minutes late.  We strive to give you quality time with our providers, and arriving late affects you and other patients whose appointments are after yours.  Also, if you no show three or more times for appointments you may be dismissed from the clinic at the providers discretion.     Again, thank you for choosing [facility].  Our hope is that these requests will decrease the amount of time that you wait before being seen by our physicians.       _____________________________________________________________  Should you have questions after your visit to [facility], please contact our office at [phone] between the hours of 8:30 a.m. and 4:30 p.m.  Voicemails left after 4:30 p.m. will not be returned until the following business day.  For prescription refill requests, have your pharmacy contact our office.       Resources For Cancer Patients and their Caregivers ? American Cancer Society: Can assist with transportation, wigs, general needs,  runs Look Good Feel Better.        (936)104-5296 ? Cancer Care: Provides financial assistance, online support groups, medication/co-pay assistance.  1-800-813-HOPE 867-491-5835) ? [place] Assists [place] cancer patients and their families through emotional , educational and financial support.  802 786 0833 ? [place] DSS Where to apply for food stamps, Medicaid and utility assistance. 330-400-3488 ? [employer]: Transportation to medical appointments. (508) 302-4846 ? Social Security Administration: May apply for disability if have a Stage IV cancer. 7734557823 308-083-0303 ? [employer], Disability and Transit Services: Assists with nutrition, care and transit needs. [place] Support Programs:   > Cancer Support Group  2nd Tuesday of the month 1pm-2pm, Journey Room   > Creative Journey  3rd Tuesday of the month 1130am-1pm, Journey Room

## 2017-10-25 NOTE — Progress Notes (Signed)
Provider aware of patient's cough and associated chest pain with coughing and deep breathing.  Orders received for chest xray to be taken before treatment today.  Orders are in.

## 2017-10-25 NOTE — Progress Notes (Signed)
Labs reviewed with and pt seen by Dr. Walden Field today. Imfinzi infusion to be held today and only Decadron infusion to be given per MD orders. Pt sent to radiology for CXR. Pt tolerated Decadron infusion well without complaints or incident. VSS upon discharge. Pt discharged self ambulatory in satisfactory condition accompanied by family member

## 2017-10-26 ENCOUNTER — Encounter: Payer: Self-pay | Admitting: Cardiovascular Disease

## 2017-10-26 ENCOUNTER — Ambulatory Visit: Payer: Medicaid Other | Admitting: Cardiovascular Disease

## 2017-10-26 ENCOUNTER — Other Ambulatory Visit (HOSPITAL_COMMUNITY): Payer: Self-pay | Admitting: *Deleted

## 2017-10-26 VITALS — BP 146/82 | HR 80 | Ht 60.0 in | Wt 148.2 lb

## 2017-10-26 DIAGNOSIS — I2510 Atherosclerotic heart disease of native coronary artery without angina pectoris: Secondary | ICD-10-CM

## 2017-10-26 DIAGNOSIS — I10 Essential (primary) hypertension: Secondary | ICD-10-CM | POA: Diagnosis not present

## 2017-10-26 DIAGNOSIS — I471 Supraventricular tachycardia: Secondary | ICD-10-CM

## 2017-10-26 DIAGNOSIS — J181 Lobar pneumonia, unspecified organism: Secondary | ICD-10-CM

## 2017-10-26 MED ORDER — METOPROLOL TARTRATE 50 MG PO TABS: 50.0000 mg | ORAL_TABLET | Freq: Two times a day (BID) | ORAL | 3 refills | Status: DC

## 2017-10-26 MED ORDER — LEVOFLOXACIN 500 MG PO TABS: 500.0000 mg | ORAL_TABLET | Freq: Every day | ORAL | 0 refills | Status: DC

## 2017-10-26 MED ORDER — ROSUVASTATIN CALCIUM 5 MG PO TABS: 5.0000 mg | ORAL_TABLET | Freq: Every day | ORAL | 3 refills | Status: AC

## 2017-10-26 NOTE — Patient Instructions (Signed)
Medication Instructions:  START CRESTOR 5 MG DAILY  Labwork: NONE  Testing/Procedures: NONE  Follow-Up: Your physician wants you to follow-up in: 6 MONTHS.  You will receive a reminder letter in the mail two months in advance. If you don't receive a letter, please call our office to schedule the follow-up appointment.   Any Other Special Instructions Will Be Listed Below (If Applicable).     If you need a refill on your cardiac medications before your next appointment, please call your pharmacy.

## 2017-10-26 NOTE — Progress Notes (Signed)
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  Assessment and Plan:  1.  Stage III squamous cell carcinoma of (R) lung: Pt was diagnosed in 02/2017. Treated with concurrent chemoradiation with weekly Carbo/Taxol x 6 cycles; last dose of chemo 05/02/17.  Post-treatment CTA chest on 06/19/17 showed smaller RUL carcinoma, now necrotic/cavitated, also with decreased size of nodal mets.  PD-L1 negative (0%).  Transferred her care to [facility] in 06/2017.  Port-a-cath was placed on 07/06/17 by Dr. Constance Haw in [place].  Consolidation therapy with Imfinzi started 07/12/17.   Pt continues complain of cough and shortness of breath.  She had a recent CT of the chest performed 09/22/3017 that showed IMPRESSION: 1. Significantly worsened right upper and superior segment right lower lobe aeration with airspace and ground-glass opacity with septal thickening. This is presumably radiation induced. Concurrent lymphangitic tumor spread cannot be excluded. 2. Given the surrounding worsened aeration, direct evaluation of primary lesion size is challenging. Given this difficulty, the right upper lobe primary is felt to be enlarged. 3. Enlargement of a right paratracheal node, suspicious for nodal metastasis. 4. New small right pleural effusion. 5. Pulmonary artery enlargement suggests pulmonary arterial hypertension. 6. Aortic atherosclerosis (ICD10-I70.0), coronary artery atherosclerosis and emphysema (ICD10-J43.9). 7. Significantly improved tree-in-bud nodularity, likely due to nearly completely resolved infectious bronchiolitis.  Imfinzi will  be held today.  She will be set up for PET scan for further evaluation of the lung findings.  Pt also continues to smoke which may also be playing a role in ongoing respiratory symptoms.  She is given a dose of Decadron 20 mg IV today in clinic.   CXR done in clinic today shows: IMPRESSION: Worsening of parenchymal consolidation in the right upper lobe which may reflect pneumonia superimposed on post radiation change or may reflect progressive malignancy. No pneumothorax, pleural effusion, or CHF  She has recently completed abx with Zpac.  She was tested for flu in Feb that was negative.  She will be treated empirically with Levaquin 500 mg po daily for 5 days and will RTC to go over PET scan.   2.  Chest congestion.  She has recently completed abx with Zpac.  She was tested for flu in Feb that was negative.  She will be treated empirically with Levaquin 500 mg po daily for 5 days and will RTC to go over PET scan.    3.  Right breast cancer: Pt was diagnosed in 2005. Treated with lumpectomy, radiation, and chemotherapy per documentation. (R) breast mammogram and ultrasound performed on 10/21/16 showed concerns for hypoechoic mass-like shadowing and peau d'orange with firmness on physical exam by radiologist.  She underwent (R) breast biopsy at 2:00 position on 11/02/16 with pathology showing benign breast tissue with fat necrosis; no evidence of malignancy. Additional (R) breast biopsy performed by Dr. Arnoldo Morale on 12/29/16 again with no malignancy identified on pathology.   Recent bilateral diagnostic mammogram was done 09/20/2017.IMPRESSION: 1. Right breast palpable abnormality corresponds with a stable benign coarse dystrophic type calcification.  2.  No mammographic evidence of malignancy in either breast.  RECOMMENDATION: Screening mammogram in one year.(Code:[id number])  Which will be  set up.    4.  Smoking.  Cessation is encouraged.    5.  Hypothyroidism.  Continue to follow-up with PCP for  management.  Will continue to monitor for endocrinopathies.   Recent TSH was  0.222.     Oncology History   Patient presented with incidental finding on pre-op CXR for a breast biopsy.     Lung mass   12/08/2016 Imaging    CXR IMPRESSION: 5 cm masslike opacity within the right upper lobe -chest CT with contrast is recommended for further evaluation.      02/01/2017 Initial Diagnosis    Lung mass      02/11/2017 Imaging    PET IMPRESSION: 1. Hypermetabolic RIGHT upper lobe mass consists with bronchogenic carcinoma. 2. Hypermetabolic RIGHT hilar and ipsilateral RIGHT paratracheal metastatic adenopathy. 3. No contralateral nodal metastasis or supraclavicular nodal metastasis.      02/28/2017 Surgery    Operation: Flexible video fiberoptic bronchoscopy with endobronchial ultrasound and biopsies.      02/28/2017 Pathology Results    Cytology Diagnosis BRONCHIAL BRUSHING SPECIMEN D, RIGHT UPPER LOBE (SPECIMEN 4 OF 4 COLLECTED 02/28/2017) MALIGNANT CELLS CONSISTENT WITH SQUAMOUS CELL CARCINOMA.        Problem List Patient Active Problem List   Diagnosis Date Noted  . Malignant neoplasm of right lung ([place]) [C34.91]   . HCAP (healthcare-associated pneumonia) [J18.9] 06/19/2017  . Hypothyroidism [E03.9] 06/19/2017  . Hypomagnesemia [E83.42] 06/19/2017  . Encounter for antineoplastic immunotherapy [Z51.12] 06/06/2017  . Chronic fatigue [R53.82] 06/06/2017  . CAP (community acquired pneumonia) [J18.9] 05/26/2017  . Pressure injury of skin [L89.90] 05/18/2017  . Hepatitis [K75.9] 05/16/2017  . Nausea vomiting and diarrhea [R11.2, R19.7] 05/16/2017  . SIRS (systemic inflammatory response syndrome) ([place]) [R65.10] 05/16/2017  . COPD (chronic obstructive pulmonary disease) ([place]) [J44.9] 05/16/2017  . Abnormal CXR [R93.89] 05/16/2017  . Anemia [D64.9] 05/16/2017  . Thrombocytopenia ([place]) [D69.6] 05/16/2017  . Depression [F32.9] 05/16/2017  . Anxiety [F41.9] 05/16/2017  . Chronic  pain [G89.29] 05/16/2017  . Acute on chronic respiratory failure ([place]) [J96.20] 05/16/2017  . Stage III squamous cell carcinoma of right lung ([place]) [C34.91] 03/10/2017  . Goals of care, counseling/discussion [Z71.89] 03/10/2017  . Encounter for antineoplastic chemotherapy [Z51.11] 03/10/2017  . Lung mass [R91.8] 02/01/2017  . Breast mass, right [N63.10]   . Hx of adenomatous colonic polyps [Z86.010] 12/28/2016  . Fracture of fifth metatarsal bone of right foot with delayed healing [S92.351G] 02/03/2016  . IBS (irritable bowel syndrome) [K58.9] 07/30/2015  . Diarrhea [R19.7] 10/29/2014  . Hoarseness [R49.0] 10/29/2014  . Chronic hoarseness [R49.0] 01/16/2014  . Other dysphagia [R13.19] 01/16/2014  . Dysphagia, unspecified(787.20) [R13.10] 06/04/2013  . Unspecified constipation [K59.00] 06/04/2013  . Unsteady gait [R26.81] 01/25/2013  . 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 DOE (dyspnea on exertion) [R06.09] 12/01/2012  . Respiratory bronchiolitis associated interstitial lung disease ([place]) [J84.115] 12/01/2012  . Dysphagia [R13.10] 11/09/2012  . COPD exacerbation ([place]) [J44.1] 08/07/2011  . Hypokalemia [E87.6] 08/07/2011  . Myositis [M60.9] 08/07/2011  . Dehydration [E86.0] 08/07/2011  . ARF (acute renal failure) ([place]) [N17.9] 08/07/2011  . Tubular adenoma [D36.9] 07/20/2011  . INSOMNIA [G47.00] 02/26/2008  . CERUMEN IMPACTION, RIGHT [H61.20] 01/23/2008  . 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 NECK PAIN [M54.2] 11/22/2007  . HAMMER TOE [Q74.2] 11/22/2007  . ANXIETY [F41.1] 01/30/2007  . TOBACCO ABUSE [F17.200] 01/30/2007  . DEPRESSION [F32.9] 01/30/2007  . GERD [K21.9] 01/30/2007  . PROLAPSE, VAGINAL WALL, CYSTOCELE, MIDLINE [  N81.11] 01/10/2007  . INCONTINENCE [R32] 01/10/2007  . SEBACEOUS CYST [L72.3] 08/17/2006  . Breast cancer, right breast [facility]) [C50.911] 12/01/2003    Past Medical History Past Medical  History:  Diagnosis Date  . Anxiety   . ARF (acute renal failure) ([place]) 08/07/2011   ?   Marland Kitchen Arthritis   . Cancer ([place]) 2005   breast-right  . Chronic back pain   . COPD (chronic obstructive pulmonary disease) ([place])   . Depression   . Depression with anxiety   . Dyspnea    on exertion  . Fibromyalgia   . GERD (gastroesophageal reflux disease)   . History of radiation therapy 03/24/17-05/12/17   right lun 2 Gy in 30 fractions  . Hypercholesteremia   . Hypothyroidism   . Hypothyroidism 06/19/2017  . Incontinence   . Neuropathy   . Peripheral neuropathy   . Sciatic pain   . Sebaceous cyst    lt labial location  . Sleep apnea    uses CPAP  . Stage III squamous cell carcinoma of right lung ([place]) 03/10/2017  . Tobacco abuse     Past Surgical History Past Surgical History:  Procedure Laterality Date  . bladder tack    . BREAST BIOPSY Right 12/29/2016   Procedure: RIGHT BREAST BIOPSY;  Surgeon: Aviva Signs, MD;  Location: [facility];  Service: General;  Laterality: Right;  . BREAST LUMPECTOMY     right with node dissection  . COLONOSCOPY  11/11/2010   ESP:[id number] rectal polyp and splenic flexure otherwise normal; pathology showed  tubular adenomas. Next TCS 10/2015.  Marland Kitchen ESOPHAGOGASTRODUODENOSCOPY  11/11/2010   JFH:LKTGY hiatal hernia, 53 French dilator  . ESOPHAGOGASTRODUODENOSCOPY (EGD) WITH ESOPHAGEAL DILATION N/A 06/21/2013   BWL:SLHTDS esophagus  - status post Maloney dilation s/p bx (benign esophageal bx)  . PORTACATH PLACEMENT Left 07/06/2017   Procedure: INSERTION PORT-A-CATH LEFT SUBCLAVIAN;  Surgeon: Virl Cagey, MD;  Location: [facility];  Service: General;  Laterality: Left;  Marland Kitchen VIDEO BRONCHOSCOPY WITH ENDOBRONCHIAL ULTRASOUND N/A 02/28/2017   Procedure: VIDEO BRONCHOSCOPY WITH ENDOBRONCHIAL ULTRASOUND;  Surgeon: Juanito Doom, MD;  Location: [facility];  Service: Thoracic;  Laterality: N/A;    Family History Family History  Problem Relation Age of Onset  . Cancer  Other   . Heart attack Other   . Lung disease Other   . Emphysema Mother   . Pneumonia Mother   . Cancer Father   . Autoimmune disease Neg Hx      Social History  reports that she has been smoking cigarettes.  She has a 35.00 pack-year smoking history. she has never used smokeless tobacco. She reports that she does not drink alcohol or use drugs.  Medications  Current Outpatient Medications:  .  albuterol (PROVENTIL HFA;VENTOLIN HFA) 108 (90 BASE) MCG/ACT inhaler, Inhale 2 puffs into the lungs every 6 (six) hours as needed for wheezing or shortness of breath., Disp: , Rfl:  .  aspirin 81 MG chewable tablet, Chew 81 mg by mouth daily., Disp: , Rfl:  .  budesonide-formoterol (SYMBICORT) 160-4.5 MCG/ACT inhaler, Inhale 2 puffs into the lungs daily. , Disp: , Rfl:  .  Durvalumab (IMFINZI IV), Inject into the vein. Every 2 weeks, Disp: , Rfl:  .  gabapentin (NEURONTIN) 800 MG tablet, Take 800 mg by mouth QID., Disp: , Rfl:  .  hydrOXYzine (VISTARIL) 25 MG capsule, Take 1 capsule (25 mg total) by mouth 3 (three) times daily as needed for itching., Disp: 30 capsule, Rfl: 0 .  levalbuterol (XOPENEX) 0.63 MG/3ML nebulizer solution, Take 3 mLs (0.63 mg total) by nebulization 3 (three) times daily., Disp: 3 mL, Rfl: 12 .  levothyroxine (SYNTHROID, LEVOTHROID) 100 MCG tablet, Take 100 mcg by mouth daily before breakfast. Reported on 02/03/2016, Disp: , Rfl:  .  lidocaine-prilocaine (EMLA) cream, Apply a quarter size amount to affected area 1 hour prior to coming to chemotherapy., Disp: 30 g, Rfl: 2 .  metoprolol tartrate (LOPRESSOR) 50 MG tablet, Take 1 tablet (50 mg total) by mouth 3 (three) times daily., Disp: 90 tablet, Rfl: 0 .  ondansetron (ZOFRAN) 8 MG tablet, Take 1 tablet (8 mg total) every 8 (eight) hours as needed by mouth for nausea or vomiting., Disp: 30 tablet, Rfl: 2 .  potassium chloride (K-DUR) 10 MEQ tablet, Take 4 tablets (40 mEq total) by mouth daily., Disp: 100 tablet, Rfl: 0 .   prochlorperazine (COMPAZINE) 10 MG tablet, Take 1 tablet (10 mg total) every 6 (six) hours as needed by mouth for nausea or vomiting., Disp: 30 tablet, Rfl: 2 .  tapentadol (NUCYNTA ER) 100 MG 12 hr tablet, Take 100 mg by mouth every 12 (twelve) hours., Disp: , Rfl:  .  tiotropium (SPIRIVA) 18 MCG inhalation capsule, Place 1 capsule (18 mcg total) into inhaler and inhale daily., Disp: 30 capsule, Rfl: 6  Allergies Penicillins  Review of Systems Review of Systems - Oncology ROS as per HPI otherwise 12 point ROS is negative.   Physical Exam  Vitals Wt Readings from Last 3 Encounters:  10/26/17 148 lb 3.2 oz (67.2 kg)  10/25/17 147 lb 12.8 oz (67 kg)  10/11/17 143 lb 12.8 oz (65.2 kg)   Temp Readings from Last 3 Encounters:  10/25/17 98.5 F (36.9 C) (Oral)  10/25/17 97.8 F (36.6 C) (Oral)  10/11/17 97.7 F (36.5 C) (Oral)   BP Readings from Last 3 Encounters:  10/26/17 (!) 146/82  10/25/17 127/65  10/25/17 (!) 108/56   Pulse Readings from Last 3 Encounters:  10/26/17 80  10/25/17 74  10/25/17 83   Constitutional: Well-developed, well-nourished, and in no distress.   HENT: Head: Normocephalic and atraumatic.  Mouth/Throat: No oropharyngeal exudate. Mucosa moist. Eyes: Pupils are equal, round, and reactive to light. Conjunctivae are normal. No scleral icterus.  Neck: Normal range of motion. Neck supple. No JVD present.  Cardiovascular: Normal rate, regular rhythm and normal heart sounds.  Exam reveals no gallop and no friction rub.   No murmur heard. Pulmonary/Chest: Effort normal.  Coarse BS Abdominal: Soft. Bowel sounds are normal. No distension. There is no tenderness. There is no guarding.  Musculoskeletal: No edema or tenderness.  Lymphadenopathy: No cervical, axillary or supraclavicular adenopathy.  Neurological: Alert and oriented to person, place, and time. No cranial nerve deficit.  Skin: Skin is warm and dry. No rash noted. No erythema. No pallor.   Psychiatric: Affect and judgment normal.   Labs Infusion on 10/25/2017  Component Date Value Ref Range Status  . Sodium 10/25/2017 133* 135 - 145 mmol/L Final  . Potassium 10/25/2017 3.8  3.5 - 5.1 mmol/L Final  . Chloride 10/25/2017 95* 101 - 111 mmol/L Final  . CO2 10/25/2017 29  22 - 32 mmol/L Final  . Glucose, Bld 10/25/2017 101* 65 - 99 mg/dL Final  . BUN 10/25/2017 7  6 - 20 mg/dL Final  . Creatinine, Ser 10/25/2017 0.66  0.44 - 1.00 mg/dL Final  . Calcium 10/25/2017 8.7* 8.9 - 10.3 mg/dL Final  . Total Protein 10/25/2017 7.9  6.5 -  8.1 g/dL Final  . Albumin 10/25/2017 3.5  3.5 - 5.0 g/dL Final  . AST 10/25/2017 14* 15 - 41 U/L Final  . ALT 10/25/2017 6* 14 - 54 U/L Final  . Alkaline Phosphatase 10/25/2017 88  38 - 126 U/L Final  . Total Bilirubin 10/25/2017 0.5  0.3 - 1.2 mg/dL Final  . GFR calc non Af Amer 10/25/2017 >60  >60 mL/min Final  . GFR calc Af Amer 10/25/2017 >60  >60 mL/min Final   Comment: (NOTE) The eGFR has been calculated using the CKD EPI equation. This calculation has not been validated in all clinical situations. eGFR's persistently <60 mL/min signify possible Chronic Kidney Disease.   Georgiann Hahn gap 10/25/2017 9  5 - 15 Final   Performed at [facility], [address] [place], [place] [address]  . WBC 10/25/2017 8.5  4.0 - 10.5 K/uL Final  . RBC 10/25/2017 4.83  3.87 - 5.11 MIL/uL Final  . Hemoglobin 10/25/2017 12.7  12.0 - 15.0 g/dL Final  . HCT 10/25/2017 42.3  36.0 - 46.0 % Final  . MCV 10/25/2017 87.6  78.0 - 100.0 fL Final  . MCH 10/25/2017 26.3  26.0 - 34.0 pg Final  . MCHC 10/25/2017 30.0  30.0 - 36.0 g/dL Final  . RDW 10/25/2017 16.0* 11.5 - 15.5 % Final  . Platelets 10/25/2017 230  150 - 400 K/uL Final  . Neutrophils Relative % 10/25/2017 79  % Final  . Neutro Abs 10/25/2017 6.8  1.7 - 7.7 K/uL Final  . Lymphocytes Relative 10/25/2017 12  % Final  . Lymphs Abs 10/25/2017 1.0  0.7 - 4.0 K/uL Final  . Monocytes Relative 10/25/2017 5  % Final   . Monocytes Absolute 10/25/2017 0.5  0.1 - 1.0 K/uL Final  . Eosinophils Relative 10/25/2017 3  % Final  . Eosinophils Absolute 10/25/2017 0.2  0.0 - 0.7 K/uL Final  . Basophils Relative 10/25/2017 1  % Final  . Basophils Absolute 10/25/2017 0.1  0.0 - 0.1 K/uL Final   Performed at [facility], [address], [place], [place] [address]     Orders Placed This Encounter  Procedures  . DG Chest 2 View    Standing Status:   Future    Number of Occurrences:   1    Standing Expiration Date:   10/25/2018    Order Specific Question:   Reason for Exam (SYMPTOM  OR DIAGNOSIS REQUIRED)    Answer:   cough, right breast cancer    Order Specific Question:   Is patient pregnant?    Answer:   No    Order Specific Question:   Preferred imaging location?    Answer:   [facility]    Order Specific Question:   Radiology Contrast Protocol - do NOT remove file path    Answer:   _0 [email or web address].pdf  . NM PET Image Initial (PI) Skull Base To Thigh    Standing Status:   Future    Standing Expiration Date:   10/25/2018    Order Specific Question:   If indicated for the ordered procedure, I authorize the administration of a radiopharmaceutical per Radiology protocol    Answer:   Yes    Order Specific Question:   Is the patient pregnant?    Answer:   No    Order Specific Question:   Preferred imaging location?    Answer:   [facility]    Order Specific Question:   Radiology Contrast Protocol - do  NOT remove file path    Answer:   _0 [email or web address]    CT of the chest performed 09/22/3017 that showed IMPRESSION: 1. Significantly worsened right upper and superior segment right lower lobe aeration with airspace and ground-glass opacity with septal thickening. This is presumably radiation induced. Concurrent lymphangitic tumor spread cannot be excluded. 2. Given the surrounding worsened aeration, direct evaluation of primary  lesion size is challenging. Given this difficulty, the right upper lobe primary is felt to be enlarged. 3. Enlargement of a right paratracheal node, suspicious for nodal metastasis. 4. New small right pleural effusion. 5. Pulmonary artery enlargement suggests pulmonary arterial hypertension. 6. Aortic atherosclerosis (ICD10-I70.0), coronary artery atherosclerosis and emphysema (ICD10-J43.9). 7. Significantly improved tree-in-bud nodularity, likely due to nearly completely resolved infectious bronchiolitis.   CXR done 10/24/2017 IMPRESSION: Worsening of parenchymal consolidation in the right upper lobe which may reflect pneumonia superimposed on post radiation change or may reflect progressive malignancy. No pneumothorax, pleural effusion, or CHF    Zoila Shutter MD

## 2017-10-26 NOTE — Progress Notes (Signed)
Per Dr. Walden Field, patient was notified of chest xray results from yesterday.  She has already been scheduled for a PET scan next week.    Patient was prescribed Levaquin 500 mg once daily x 5 days.  Patient is aware and will pick up the prescription today.

## 2017-10-26 NOTE — Progress Notes (Signed)
SUBJECTIVE: The patient presents to establish care in our [place] office.  She has a history of COPD, right lung cancer, breast cancer, sleep apnea, hypothyroidism, hyperlipidemia, GERD, paroxysmal SVT, and elevated troponin reflective of demand ischemia while hospitalized in September 2018.  QT interval was prolonged at that time.  I reviewed her most recent ECG performed on 05/21/17 which demonstrated sinus rhythm with PACs, QTC 456 ms, and precordial T wave inversions primarily in leads V1 through V3 with nonspecific T wave abnormalities in lead V4.  I reviewed her echocardiogram performed on 05/19/17 which showed normal left ventricular systolic function and regional wall motion, LVEF 55-60%, normal cavity size and normal wall thickness, and grade 2 diastolic dysfunction.  I reviewed a chest CT performed on 09/22/17 which demonstrated advanced aortic and branch vessel atherosclerosis with LAD atherosclerosis.  There was also suspicion for nodal metastasis.  She underwent a chest x-ray yesterday which showed increasing right upper lobe consolidation concerning for either pneumonia versus worsening malignancy.  She was prescribed antibiotics and is being scheduled for a repeat chest CT.  She denies exertional chest pain.  This past Sunday she had right sided chest pain with coughing with symptoms made worse with deep inspiration.  She denies any left-sided chest pains.  She denies palpitations.  Her home medication list indicates she is taking metoprolol 50 mg twice daily.  Hemoglobin normal at 12.7 on 10/25/17.  Troponins peaked at 0.4 on 05/17/17.  The trend was as follows: 0.36, 0.4, 0.36, 0.23.  She denies leg swelling, orthopnea, and paroxysmal nocturnal dyspnea.  She continues to smoke 1 pack of cigarettes daily.    Review of Systems: As per "subjective", otherwise negative.  Allergies  Allergen Reactions  . Penicillins Anaphylaxis and Other (See Comments)    Has patient had  a PCN reaction causing immediate rash, facial/tongue/throat swelling, SOB or lightheadedness with hypotension:Yes Has patient had a PCN reaction causing severe rash involving mucus membranes or skin necrosis:Yes Has patient had a PCN reaction that required hospitalization:No Has patient had a PCN reaction occurring within the last 10 years:No If all of the above answers are "NO", then may proceed with Cephalosporin use.     Current Outpatient Medications  Medication Sig Dispense Refill  . albuterol (PROVENTIL HFA;VENTOLIN HFA) 108 (90 BASE) MCG/ACT inhaler Inhale 2 puffs into the lungs every 6 (six) hours as needed for wheezing or shortness of breath.    Marland Kitchen aspirin 81 MG chewable tablet Chew 81 mg by mouth daily.    . budesonide-formoterol (SYMBICORT) 160-4.5 MCG/ACT inhaler Inhale 2 puffs into the lungs daily.     Hunt Oris (IMFINZI IV) Inject into the vein. Every 2 weeks    . gabapentin (NEURONTIN) 800 MG tablet Take 800 mg by mouth QID.    Marland Kitchen hydrOXYzine (VISTARIL) 25 MG capsule Take 1 capsule (25 mg total) by mouth 3 (three) times daily as needed for itching. 30 capsule 0  . levalbuterol (XOPENEX) 0.63 MG/3ML nebulizer solution Take 3 mLs (0.63 mg total) by nebulization 3 (three) times daily. 3 mL 12  . levothyroxine (SYNTHROID, LEVOTHROID) 100 MCG tablet Take 100 mcg by mouth daily before breakfast. Reported on 02/03/2016    . lidocaine-prilocaine (EMLA) cream Apply a quarter size amount to affected area 1 hour prior to coming to chemotherapy. 30 g 2  . metoprolol tartrate (LOPRESSOR) 50 MG tablet Take 1 tablet (50 mg total) by mouth 3 (three) times daily. 90 tablet 0  . ondansetron (ZOFRAN)  8 MG tablet Take 1 tablet (8 mg total) every 8 (eight) hours as needed by mouth for nausea or vomiting. 30 tablet 2  . potassium chloride (K-DUR) 10 MEQ tablet Take 4 tablets (40 mEq total) by mouth daily. 100 tablet 0  . prochlorperazine (COMPAZINE) 10 MG tablet Take 1 tablet (10 mg total) every 6  (six) hours as needed by mouth for nausea or vomiting. 30 tablet 2  . tapentadol (NUCYNTA ER) 100 MG 12 hr tablet Take 100 mg by mouth every 12 (twelve) hours.    Marland Kitchen tiotropium (SPIRIVA) 18 MCG inhalation capsule Place 1 capsule (18 mcg total) into inhaler and inhale daily. 30 capsule 6   No current facility-administered medications for this visit.     Past Medical History:  Diagnosis Date  . Anxiety   . ARF (acute renal failure) ([facility]) 08/07/2011   ?   Marland Kitchen Arthritis   . Cancer ([facility]) 2005   breast-right  . Chronic back pain   . COPD (chronic obstructive pulmonary disease) ([facility])   . Depression   . Depression with anxiety   . Dyspnea    on exertion  . Fibromyalgia   . GERD (gastroesophageal reflux disease)   . History of radiation therapy 03/24/17-05/12/17   right lun 2 Gy in 30 fractions  . Hypercholesteremia   . Hypothyroidism   . Hypothyroidism 06/19/2017  . Incontinence   . Neuropathy   . Peripheral neuropathy   . Sciatic pain   . Sebaceous cyst    lt labial location  . Sleep apnea    uses CPAP  . Stage III squamous cell carcinoma of right lung ([facility]) 03/10/2017  . Tobacco abuse     Past Surgical History:  Procedure Laterality Date  . bladder tack    . BREAST BIOPSY Right 12/29/2016   Procedure: RIGHT BREAST BIOPSY;  Surgeon: Aviva Signs, MD;  Location: [facility];  Service: General;  Laterality: Right;  . BREAST LUMPECTOMY     right with node dissection  . COLONOSCOPY  11/11/2010   NUU:[id number] rectal polyp and splenic flexure otherwise normal; pathology showed  tubular adenomas. Next TCS 10/2015.  Marland Kitchen ESOPHAGOGASTRODUODENOSCOPY  11/11/2010   KVQ:QVZDG hiatal hernia, 19 French dilator  . ESOPHAGOGASTRODUODENOSCOPY (EGD) WITH ESOPHAGEAL DILATION N/A 06/21/2013   LOV:FIEPPI esophagus  - status post Maloney dilation s/p bx (benign esophageal bx)  . PORTACATH PLACEMENT Left 07/06/2017   Procedure: INSERTION PORT-A-CATH LEFT SUBCLAVIAN;  Surgeon: Virl Cagey, MD;  Location:  [facility];  Service: General;  Laterality: Left;  Marland Kitchen VIDEO BRONCHOSCOPY WITH ENDOBRONCHIAL ULTRASOUND N/A 02/28/2017   Procedure: VIDEO BRONCHOSCOPY WITH ENDOBRONCHIAL ULTRASOUND;  Surgeon: Juanito Doom, MD;  Location: [facility];  Service: Thoracic;  Laterality: N/A;    Social History   Socioeconomic History  . Marital status: Married    Spouse name: Not on file  . Number of children: 1  . Years of education: Not on file  . Highest education level: Not on file  Social Needs  . Financial resource strain: Not on file  . Food insecurity - worry: Not on file  . Food insecurity - inability: Not on file  . Transportation needs - medical: Not on file  . Transportation needs - non-medical: Not on file  Occupational History  . Occupation: disabled    Fish farm manager: UNEMPLOYED  Tobacco Use  . Smoking status: Current Every Day Smoker    Packs/day: 1.00    Years: 35.00    Pack years: 35.00  Types: Cigarettes  . Smokeless tobacco: Never Used  . Tobacco comment: has cut back to one pack daily  Substance and Sexual Activity  . Alcohol use: No    Alcohol/week: 0.0 oz  . Drug use: No  . Sexual activity: Yes    Birth control/protection: Post-menopausal    Comment: chemo stopped her periods  Other Topics Concern  . Not on file  Social History Narrative   Lives w. Husband   Lost 1 son-?etiology     Vitals:   10/26/17 0917 10/26/17 0921  BP: (!) 146/86 (!) 146/82  Pulse: 80   SpO2: 96%   Weight: 148 lb 3.2 oz (67.2 kg)   Height: 5' (1.524 m)     Wt Readings from Last 3 Encounters:  10/26/17 148 lb 3.2 oz (67.2 kg)  10/25/17 147 lb 12.8 oz (67 kg)  10/11/17 143 lb 12.8 oz (65.2 kg)     PHYSICAL EXAM General: NAD HEENT: Normal. Neck: No JVD, no thyromegaly. Lungs: Clear to auscultation bilaterally with normal respiratory effort. CV: Regular rate and rhythm, normal S1/S2, no S3/S4, no murmur. No pretibial or periankle edema.  No carotid bruit.   Abdomen: Soft, nontender, no  distention.  Neurologic: Alert and oriented.  Psych: Normal affect. Skin: Normal. Musculoskeletal: No gross deformities.    ECG: Most recent ECG reviewed.   Labs: Lab Results  Component Value Date/Time   K 3.8 10/25/2017 11:57 AM   K 3.1 (L) 06/03/2017 08:04 AM   BUN 7 10/25/2017 11:57 AM   BUN 9.2 06/03/2017 08:04 AM   CREATININE 0.66 10/25/2017 11:57 AM   CREATININE 0.8 06/03/2017 08:04 AM   ALT 6 (L) 10/25/2017 11:57 AM   ALT 9 06/03/2017 08:04 AM   TSH 0.222 (L) 10/11/2017 11:21 AM   TSH 1.474 04/28/2010 07:05 PM   HGB 12.7 10/25/2017 11:57 AM   HGB 8.9 (L) 06/03/2017 08:04 AM     Lipids: Lab Results  Component Value Date/Time   LDLCALC 124 (H) 02/26/2008 10:17 PM   CHOL 220 (H) 02/26/2008 10:17 PM   TRIG 203 (H) 05/17/2017 12:20 AM   HDL 41 02/26/2008 10:17 PM       ASSESSMENT AND PLAN: 1.  Paroxysmal SVT: Symptomatically stable on metoprolol 50 mg twice daily.  I will provide refills.  2.  Coronary artery calcifications: She denies exertional angina.  She is taking aspirin and metoprolol.  I will initiate Crestor at a low dose of 5 mg daily for its pleiotropic effects.  I do not feel stress testing is indicated at this time.  She did have what appeared to be demand ischemia in September 2018 with troponins peaking at 0.4 as detailed above.  3.  Right upper lobe consolidation: She is currently on levofloxacin.  She is being scheduled for repeat chest CT to see whether or not this reflects pneumonia versus worsening malignancy.  4.  Hypertension: Blood pressure is mildly elevated.  I will monitor.    Disposition: Follow up 6 months  Time spent: 40 minutes, of which greater than 50% was spent reviewing symptoms, relevant blood tests and studies, and discussing management plan with the patient.    Kate Sable, M.D., F.A.C.C.

## 2017-11-01 ENCOUNTER — Encounter (HOSPITAL_COMMUNITY): Payer: Medicaid Other

## 2017-11-02 ENCOUNTER — Other Ambulatory Visit (HOSPITAL_COMMUNITY): Payer: Self-pay | Admitting: Internal Medicine

## 2017-11-02 ENCOUNTER — Ambulatory Visit (HOSPITAL_COMMUNITY)
Admission: RE | Admit: 2017-11-02 | Discharge: 2017-11-02 | Disposition: A | Discharge status: Home / Self Care | Payer: Medicaid Other | Source: Ambulatory Visit | Attending: Internal Medicine | Admitting: Internal Medicine

## 2017-11-02 DIAGNOSIS — C50911 Malignant neoplasm of unspecified site of right female breast: Secondary | ICD-10-CM

## 2017-11-02 DIAGNOSIS — I700 Atherosclerosis of aorta: Secondary | ICD-10-CM | POA: Insufficient documentation

## 2017-11-02 DIAGNOSIS — C3491 Malignant neoplasm of unspecified part of right bronchus or lung: Secondary | ICD-10-CM | POA: Diagnosis not present

## 2017-11-02 LAB — GLUCOSE, CAPILLARY: Glucose-Capillary: 105 mg/dL — ABNORMAL HIGH (ref 65–99)

## 2017-11-02 MED ORDER — FLUDEOXYGLUCOSE F - 18 (FDG) INJECTION
7.3400 | Freq: Once | INTRAVENOUS | Status: AC | PRN
  Administered 2017-11-02: 7.34 via INTRAVENOUS

## 2017-11-03 ENCOUNTER — Encounter (HOSPITAL_COMMUNITY): Payer: Self-pay | Admitting: Hematology

## 2017-11-03 ENCOUNTER — Inpatient Hospital Stay (HOSPITAL_COMMUNITY): Payer: Medicaid Other

## 2017-11-03 ENCOUNTER — Encounter (HOSPITAL_COMMUNITY): Payer: Self-pay

## 2017-11-03 ENCOUNTER — Inpatient Hospital Stay (HOSPITAL_BASED_OUTPATIENT_CLINIC_OR_DEPARTMENT_OTHER): Payer: Medicaid Other | Admitting: Hematology

## 2017-11-03 VITALS — BP 131/64 | HR 86 | Temp 98.0°F | Resp 20

## 2017-11-03 VITALS — BP 127/68 | HR 100 | Temp 98.4°F | Resp 20 | Wt 149.5 lb

## 2017-11-03 DIAGNOSIS — R05 Cough: Secondary | ICD-10-CM | POA: Diagnosis not present

## 2017-11-03 DIAGNOSIS — Z5112 Encounter for antineoplastic immunotherapy: Secondary | ICD-10-CM | POA: Diagnosis not present

## 2017-11-03 DIAGNOSIS — E039 Hypothyroidism, unspecified: Secondary | ICD-10-CM

## 2017-11-03 DIAGNOSIS — Z7982 Long term (current) use of aspirin: Secondary | ICD-10-CM

## 2017-11-03 DIAGNOSIS — Z79899 Other long term (current) drug therapy: Secondary | ICD-10-CM | POA: Diagnosis not present

## 2017-11-03 DIAGNOSIS — C3411 Malignant neoplasm of upper lobe, right bronchus or lung: Secondary | ICD-10-CM

## 2017-11-03 DIAGNOSIS — F17210 Nicotine dependence, cigarettes, uncomplicated: Secondary | ICD-10-CM

## 2017-11-03 DIAGNOSIS — E46 Unspecified protein-calorie malnutrition: Secondary | ICD-10-CM

## 2017-11-03 DIAGNOSIS — C3491 Malignant neoplasm of unspecified part of right bronchus or lung: Secondary | ICD-10-CM

## 2017-11-03 DIAGNOSIS — R110 Nausea: Secondary | ICD-10-CM

## 2017-11-03 DIAGNOSIS — R530 Neoplastic (malignant) related fatigue: Secondary | ICD-10-CM | POA: Diagnosis not present

## 2017-11-03 DIAGNOSIS — R0602 Shortness of breath: Secondary | ICD-10-CM

## 2017-11-03 MED ORDER — SODIUM CHLORIDE 0.9% FLUSH
10.0000 mL | INTRAVENOUS | Status: DC | PRN
  Administered 2017-11-03: 10 mL
  Filled 2017-11-03: qty 10

## 2017-11-03 MED ORDER — HEPARIN SOD (PORK) LOCK FLUSH 100 UNIT/ML IV SOLN
500.0000 [IU] | Freq: Once | INTRAVENOUS | Status: AC | PRN
  Administered 2017-11-03: 500 [IU]

## 2017-11-03 MED ORDER — SODIUM CHLORIDE 0.9 % IV SOLN
Freq: Once | INTRAVENOUS | Status: AC
  Administered 2017-11-03: 14:00:00 via INTRAVENOUS

## 2017-11-03 MED ORDER — SODIUM CHLORIDE 0.9 % IV SOLN
620.0000 mg | Freq: Once | INTRAVENOUS | Status: AC
  Administered 2017-11-03: 620 mg via INTRAVENOUS
  Filled 2017-11-03: qty 10

## 2017-11-03 NOTE — Patient Instructions (Signed)
[facility] Discharge Instructions for Patients Receiving Chemotherapy   Beginning January 23rd 2017 lab work for the [facility] will be done in the  Main lab at [facility] on 1st floor. If you have a lab appointment with the [facility] please come in thru the  Main Entrance and check in at the main information desk   Today you received the following chemotherapy agents Imfinzi. Follow-up as scheduled. Call clinic for any questions or concerns  To help prevent nausea and vomiting after your treatment, we encourage you to take your nausea medication   If you develop nausea and vomiting, or diarrhea that is not controlled by your medication, call the clinic.  The clinic phone number is [phone]. Office hours are Monday-Friday 8:30am-5:00pm.  BELOW ARE SYMPTOMS THAT SHOULD BE REPORTED IMMEDIATELY:  *FEVER GREATER THAN 101.0 F  *CHILLS WITH OR WITHOUT FEVER  NAUSEA AND VOMITING THAT IS NOT CONTROLLED WITH YOUR NAUSEA MEDICATION  *UNUSUAL SHORTNESS OF BREATH  *UNUSUAL BRUISING OR BLEEDING  TENDERNESS IN MOUTH AND THROAT WITH OR WITHOUT PRESENCE OF ULCERS  *URINARY PROBLEMS  *BOWEL PROBLEMS  UNUSUAL RASH Items with * indicate a potential emergency and should be followed up as soon as possible. If you have an emergency after office hours please contact your primary care physician or go to the nearest emergency department.  Please call the clinic during office hours if you have any questions or concerns.   You may also contact the Patient Navigator at [phone] should you have any questions or need assistance in obtaining follow up care.      Resources For Cancer Patients and their Caregivers ? American Cancer Society: Can assist with transportation, wigs, general needs, runs Look Good Feel Better.        769 235 8261 ? Cancer Care: Provides financial assistance, online support groups, medication/co-pay assistance.  1-800-813-HOPE  305-562-7817) ? [facility] Assists [place] cancer patients and their families through emotional , educational and financial support.  6146714500 ? [place] DSS Where to apply for food stamps, Medicaid and utility assistance. 651-561-3409 ? [facility]: Transportation to medical appointments. (647) 596-9177 ? Social Security Administration: May apply for disability if have a Stage IV cancer. 408-012-7571 231-377-2845 ? [employer], Disability and Transit Services: Assists with nutrition, care and transit needs. 587-873-2637

## 2017-11-03 NOTE — Progress Notes (Signed)
[place], [place] [address]   CLINIC:  Medical Oncology/Hematology  PCP:  [name], MD [address] [place] [address] [phone]   REASON FOR VISIT:  Follow-up for PET scan results.  CURRENT THERAPY: Durvalumab consolidation therapy every 2 weeks.  BRIEF ONCOLOGIC HISTORY:  Oncology History   Patient presented with incidental finding on pre-op CXR for a breast biopsy.     Lung mass   12/08/2016 Imaging    CXR IMPRESSION: 5 cm masslike opacity within the right upper lobe -chest CT with contrast is recommended for further evaluation.      02/01/2017 Initial Diagnosis    Lung mass      02/11/2017 Imaging    PET IMPRESSION: 1. Hypermetabolic RIGHT upper lobe mass consists with bronchogenic carcinoma. 2. Hypermetabolic RIGHT hilar and ipsilateral RIGHT paratracheal metastatic adenopathy. 3. No contralateral nodal metastasis or supraclavicular nodal metastasis.      02/28/2017 Surgery    Operation: Flexible video fiberoptic bronchoscopy with endobronchial ultrasound and biopsies.      02/28/2017 Pathology Results    Cytology Diagnosis BRONCHIAL BRUSHING SPECIMEN D, RIGHT UPPER LOBE (SPECIMEN 4 OF 4 COLLECTED 02/28/2017) MALIGNANT CELLS CONSISTENT WITH SQUAMOUS CELL CARCINOMA.         CANCER STAGING: Stage III a (T2b N2 M0) non-small cell lung cancer  INTERVAL HISTORY:  Ms. Makris 62 y.o. female returns for routine follow-up and consideration for next cycle of chemotherapy.  She was ordered a PET CT scan last week for question of progression.  She still has some baseline cough.  She is not wearing her oxygen today.  Her oxygen saturations are 91%.  She complains of fatigue which is also stable.  She is drinking Ensure.  Her weight has been stable.      REVIEW OF SYSTEMS:  Review of Systems  Constitutional: Positive for appetite change and fatigue. Negative for fever and unexpected weight change.  HENT:   Negative.   Respiratory: Positive for cough and shortness of breath.   Cardiovascular: Negative.   Gastrointestinal: Positive for nausea. Negative for abdominal distention and abdominal pain.  Genitourinary: Negative.    Neurological: Negative.   Hematological: Negative.      PAST MEDICAL/SURGICAL HISTORY:  Past Medical History:  Diagnosis Date  . Anxiety   . ARF (acute renal failure) ([facility]) 08/07/2011   ?   Marland Kitchen Arthritis   . Cancer ([facility]) 2005   breast-right  . Chronic back pain   . COPD (chronic obstructive pulmonary disease) ([facility])   . Depression   . Depression with anxiety   . Dyspnea    on exertion  . Fibromyalgia   . GERD (gastroesophageal reflux disease)   . History of radiation therapy 03/24/17-05/12/17   right lun 2 Gy in 30 fractions  . Hypercholesteremia   . Hypothyroidism   . Hypothyroidism 06/19/2017  . Incontinence   . Neuropathy   . Peripheral neuropathy   . Sciatic pain   . Sebaceous cyst    lt labial location  . Sleep apnea    uses CPAP  . Stage III squamous cell carcinoma of right lung ([facility]) 03/10/2017  . Tobacco abuse    Past Surgical History:  Procedure Laterality Date  . bladder tack    . BREAST BIOPSY Right 12/29/2016   Procedure: RIGHT BREAST BIOPSY;  Surgeon: Aviva Signs, MD;  Location: [facility];  Service: General;  Laterality: Right;  . BREAST LUMPECTOMY     right with node  dissection  . COLONOSCOPY  11/11/2010   YQM:VHQIONGEXB rectal polyp and splenic flexure otherwise normal; pathology showed  tubular adenomas. Next TCS 10/2015.  Marland Kitchen ESOPHAGOGASTRODUODENOSCOPY  11/11/2010   MWU:XLKGM hiatal hernia, 4 French dilator  . ESOPHAGOGASTRODUODENOSCOPY (EGD) WITH ESOPHAGEAL DILATION N/A 06/21/2013   WNU:UVOZDG esophagus  - status post Maloney dilation s/p bx (benign esophageal bx)  . PORTACATH PLACEMENT Left 07/06/2017   Procedure: INSERTION PORT-A-CATH LEFT SUBCLAVIAN;  Surgeon: Virl Cagey, MD;  Location: [facility];  Service: General;  Laterality:  Left;  Marland Kitchen VIDEO BRONCHOSCOPY WITH ENDOBRONCHIAL ULTRASOUND N/A 02/28/2017   Procedure: VIDEO BRONCHOSCOPY WITH ENDOBRONCHIAL ULTRASOUND;  Surgeon: Juanito Doom, MD;  Location: [facility];  Service: Thoracic;  Laterality: N/A;     SOCIAL HISTORY:  Social History   Socioeconomic History  . Marital status: Married    Spouse name: Not on file  . Number of children: 1  . Years of education: Not on file  . Highest education level: Not on file  Social Needs  . Financial resource strain: Not on file  . Food insecurity - worry: Not on file  . Food insecurity - inability: Not on file  . Transportation needs - medical: Not on file  . Transportation needs - non-medical: Not on file  Occupational History  . Occupation: disabled    Fish farm manager: UNEMPLOYED  Tobacco Use  . Smoking status: Current Every Day Smoker    Packs/day: 1.00    Years: 35.00    Pack years: 35.00    Types: Cigarettes  . Smokeless tobacco: Never Used  . Tobacco comment: has cut back to one pack daily  Substance and Sexual Activity  . Alcohol use: No    Alcohol/week: 0.0 oz  . Drug use: No  . Sexual activity: Yes    Birth control/protection: Post-menopausal    Comment: chemo stopped her periods  Other Topics Concern  . Not on file  Social History Narrative   Lives w. Husband   Lost 1 son-?etiology    FAMILY HISTORY:  Family History  Problem Relation Age of Onset  . Cancer Other   . Heart attack Other   . Lung disease Other   . Emphysema Mother   . Pneumonia Mother   . Cancer Father   . Autoimmune disease Neg Hx     CURRENT MEDICATIONS:  Outpatient Encounter Medications as of 11/03/2017  Medication Sig Note  . albuterol (PROVENTIL HFA;VENTOLIN HFA) 108 (90 BASE) MCG/ACT inhaler Inhale 2 puffs into the lungs every 6 (six) hours as needed for wheezing or shortness of breath.   Marland Kitchen aspirin 81 MG chewable tablet Chew 81 mg by mouth daily.   . budesonide-formoterol (SYMBICORT) 160-4.5 MCG/ACT inhaler Inhale 2  puffs into the lungs daily.    Hunt Oris (IMFINZI IV) Inject into the vein. Every 2 weeks   . gabapentin (NEURONTIN) 800 MG tablet Take 800 mg by mouth QID.   Marland Kitchen hydrOXYzine (VISTARIL) 25 MG capsule Take 1 capsule (25 mg total) by mouth 3 (three) times daily as needed for itching.   . levalbuterol (XOPENEX) 0.63 MG/3ML nebulizer solution Take 3 mLs (0.63 mg total) by nebulization 3 (three) times daily. 06/19/2017: Machine broke  . levofloxacin (LEVAQUIN) 500 MG tablet Take 1 tablet (500 mg total) by mouth daily.   Marland Kitchen levothyroxine (SYNTHROID, LEVOTHROID) 100 MCG tablet Take 100 mcg by mouth daily before breakfast. Reported on 02/03/2016   . lidocaine-prilocaine (EMLA) cream Apply a quarter size amount to affected area 1 hour prior  to coming to chemotherapy.   . metoprolol tartrate (LOPRESSOR) 50 MG tablet Take 1 tablet (50 mg total) by mouth 2 (two) times daily.   . ondansetron (ZOFRAN) 8 MG tablet Take 1 tablet (8 mg total) every 8 (eight) hours as needed by mouth for nausea or vomiting.   . potassium chloride (K-DUR) 10 MEQ tablet Take 4 tablets (40 mEq total) by mouth daily.   . prochlorperazine (COMPAZINE) 10 MG tablet Take 1 tablet (10 mg total) every 6 (six) hours as needed by mouth for nausea or vomiting.   . rosuvastatin (CRESTOR) 5 MG tablet Take 1 tablet (5 mg total) by mouth daily.   . tapentadol (NUCYNTA ER) 100 MG 12 hr tablet Take 100 mg by mouth every 12 (twelve) hours.   Marland Kitchen tiotropium (SPIRIVA) 18 MCG inhalation capsule Place 1 capsule (18 mcg total) into inhaler and inhale daily.    No facility-administered encounter medications on file as of 11/03/2017.     ALLERGIES:  Allergies  Allergen Reactions  . Penicillins Anaphylaxis and Other (See Comments)    Has patient had a PCN reaction causing immediate rash, facial/tongue/throat swelling, SOB or lightheadedness with hypotension:Yes Has patient had a PCN reaction causing severe rash involving mucus membranes or skin  necrosis:Yes Has patient had a PCN reaction that required hospitalization:No Has patient had a PCN reaction occurring within the last 10 years:No If all of the above answers are "NO", then may proceed with Cephalosporin use.      PHYSICAL EXAM:  ECOG Performance status: 2  Vitals:   11/03/17 1308  BP: 127/68  Pulse: 100  Resp: 20  Temp: 98.4 F (36.9 C)  SpO2: 91%   Filed Weights   11/03/17 1308  Weight: 149 lb 8 oz (67.8 kg)    Physical Exam deferred.   LABORATORY DATA:  I have reviewed the labs as listed.  CBC    Component Value Date/Time   WBC 8.5 10/25/2017 1157   RBC 4.83 10/25/2017 1157   HGB 12.7 10/25/2017 1157   HGB 8.9 (L) 06/03/2017 0804   HCT 42.3 10/25/2017 1157   HCT 27.8 (L) 06/03/2017 0804   PLT 230 10/25/2017 1157   PLT 114 (L) 06/03/2017 0804   MCV 87.6 10/25/2017 1157   MCV 99.3 06/03/2017 0804   MCH 26.3 10/25/2017 1157   MCHC 30.0 10/25/2017 1157   RDW 16.0 (H) 10/25/2017 1157   RDW 23.7 (H) 06/03/2017 0804   LYMPHSABS 1.0 10/25/2017 1157   LYMPHSABS 0.9 06/03/2017 0804   MONOABS 0.5 10/25/2017 1157   MONOABS 0.3 06/03/2017 0804   EOSABS 0.2 10/25/2017 1157   EOSABS 0.2 06/03/2017 0804   BASOSABS 0.1 10/25/2017 1157   BASOSABS 0.0 06/03/2017 0804   CMP Latest Ref Rng & Units 10/25/2017 10/11/2017 09/23/2017  Glucose 65 - 99 mg/dL 101(H) 91 93  BUN 6 - 20 mg/dL 7 7 8   Creatinine 0.44 - 1.00 mg/dL 0.66 0.59 0.60  Sodium 135 - 145 mmol/L 133(L) 136 136  Potassium 3.5 - 5.1 mmol/L 3.8 4.1 3.4(L)  Chloride 101 - 111 mmol/L 95(L) 98(L) 95(L)  CO2 22 - 32 mmol/L 29 27 29   Calcium 8.9 - 10.3 mg/dL 8.7(L) 9.0 8.6(L)  Total Protein 6.5 - 8.1 g/dL 7.9 7.7 7.4  Total Bilirubin 0.3 - 1.2 mg/dL 0.5 0.3 0.5  Alkaline Phos 38 - 126 U/L 88 85 72  AST 15 - 41 U/L 14(L) 11(L) 12(L)  ALT 14 - 54 U/L 6(L) 6(L) 8(L)  PENDING LABS:    DIAGNOSTIC IMAGING:  I have reviewed her PET CT scan and compared it with the prior scan from July.  There was  overall treatment response with residual relatively hypometabolic cavitary lesion in the posterior segment, right upper lobe and surrounding hypermetabolic evolutionary changes of radiation therapy.  Mildly hypermetabolic subcarinal lymph node.  No new areas seen.   ASSESSMENT & PLAN:  1.Stage III non-small cell lung cancer, adenocarcinoma type: I have discussed the results of the PET/CT scan with the patient in detail.  This shows stable disease.  Hence I have recommended continuing Durvalumab at this time.  We plan to repeat CT scans in 2-3 months.  Her chronic cough has improved from last visit.  She will come back in 2 weeks for her next treatment and see me in 4 weeks.  2.  Remote history of right breast cancer: Diagnosed in 2005, treated with lumpectomy and radiation and adjuvant chemotherapy.  She will have mammogram later this month.  3.  Hypothyroidism: She is currently on Synthroid 100 mcg daily.  Her last TSH was 0.22.  I plan to repeat at next visit.  I do not want to make any changes at this time.      Derek Jack MD [facility] [phone]

## 2017-11-03 NOTE — Progress Notes (Signed)
1400 Pt seen by Dr. Delton Coombes today and approved for tx with no labs needed pe MD                                   Fanny Dance tolerated Imfinzi infusion well without complaints or incident. VSS upon discharge. Pt discharged self ambulatory in satisfactory condition

## 2017-11-04 ENCOUNTER — Ambulatory Visit (HOSPITAL_COMMUNITY): Payer: Medicaid Other

## 2017-11-07 ENCOUNTER — Ambulatory Visit (HOSPITAL_COMMUNITY)
Admission: RE | Admit: 2017-11-07 | Discharge: 2017-11-07 | Disposition: A | Discharge status: Home / Self Care | Payer: Medicaid Other | Source: Ambulatory Visit | Attending: Internal Medicine | Admitting: Internal Medicine

## 2017-11-07 ENCOUNTER — Encounter (HOSPITAL_COMMUNITY): Payer: Self-pay | Admitting: *Deleted

## 2017-11-07 ENCOUNTER — Emergency Department (HOSPITAL_COMMUNITY)
Admission: EM | Admit: 2017-11-07 | Discharge: 2017-11-07 | Disposition: A | Discharge status: Home / Self Care | Payer: Medicaid Other | Attending: Emergency Medicine | Admitting: Emergency Medicine

## 2017-11-07 ENCOUNTER — Other Ambulatory Visit: Payer: Self-pay

## 2017-11-07 ENCOUNTER — Other Ambulatory Visit (HOSPITAL_COMMUNITY): Payer: Self-pay | Admitting: Internal Medicine

## 2017-11-07 DIAGNOSIS — J449 Chronic obstructive pulmonary disease, unspecified: Secondary | ICD-10-CM | POA: Insufficient documentation

## 2017-11-07 DIAGNOSIS — Z79899 Other long term (current) drug therapy: Secondary | ICD-10-CM | POA: Diagnosis not present

## 2017-11-07 DIAGNOSIS — I82622 Acute embolism and thrombosis of deep veins of left upper extremity: Secondary | ICD-10-CM | POA: Insufficient documentation

## 2017-11-07 DIAGNOSIS — M7989 Other specified soft tissue disorders: Principal | ICD-10-CM

## 2017-11-07 DIAGNOSIS — M79602 Pain in left arm: Secondary | ICD-10-CM | POA: Insufficient documentation

## 2017-11-07 DIAGNOSIS — D0221 Carcinoma in situ of right bronchus and lung: Secondary | ICD-10-CM | POA: Diagnosis not present

## 2017-11-07 DIAGNOSIS — E039 Hypothyroidism, unspecified: Secondary | ICD-10-CM | POA: Diagnosis not present

## 2017-11-07 DIAGNOSIS — Z9889 Other specified postprocedural states: Secondary | ICD-10-CM | POA: Insufficient documentation

## 2017-11-07 DIAGNOSIS — F17210 Nicotine dependence, cigarettes, uncomplicated: Secondary | ICD-10-CM | POA: Insufficient documentation

## 2017-11-07 DIAGNOSIS — R2232 Localized swelling, mass and lump, left upper limb: Secondary | ICD-10-CM | POA: Diagnosis present

## 2017-11-07 LAB — PROTIME-INR
INR: 1.02
Prothrombin Time: 13.4 seconds (ref 11.4–15.2)

## 2017-11-07 LAB — COMPREHENSIVE METABOLIC PANEL
ALT: 7 U/L — ABNORMAL LOW (ref 14–54)
AST: 13 U/L — ABNORMAL LOW (ref 15–41)
Albumin: 3.1 g/dL — ABNORMAL LOW (ref 3.5–5.0)
Alkaline Phosphatase: 85 U/L (ref 38–126)
Anion gap: 11 (ref 5–15)
BUN: 5 mg/dL — ABNORMAL LOW (ref 6–20)
CO2: 28 mmol/L (ref 22–32)
Calcium: 8.4 mg/dL — ABNORMAL LOW (ref 8.9–10.3)
Chloride: 93 mmol/L — ABNORMAL LOW (ref 101–111)
Creatinine, Ser: 0.71 mg/dL (ref 0.44–1.00)
GFR calc Af Amer: >60 mL/min (ref >60)
GFR calc non Af Amer: >60 mL/min (ref >60)
Glucose, Bld: 89 mg/dL (ref 65–99)
Potassium: 3.6 mmol/L (ref 3.5–5.1)
Sodium: 132 mmol/L — ABNORMAL LOW (ref 135–145)
Total Bilirubin: 1 mg/dL (ref 0.3–1.2)
Total Protein: 7.1 g/dL (ref 6.5–8.1)

## 2017-11-07 LAB — DIFFERENTIAL
Basophils Absolute: 0 10*3/uL (ref 0.0–0.1)
Basophils Relative: 0 %
Eosinophils Absolute: 0.1 10*3/uL (ref 0.0–0.7)
Eosinophils Relative: 1 %
Lymphocytes Relative: 11 %
Lymphs Abs: 1.4 10*3/uL (ref 0.7–4.0)
Monocytes Absolute: 0.9 10*3/uL (ref 0.1–1.0)
Monocytes Relative: 7 %
Neutro Abs: 10.9 10*3/uL — ABNORMAL HIGH (ref 1.7–7.7)
Neutrophils Relative %: 81 %

## 2017-11-07 LAB — CBC
HCT: 40.6 % (ref 36.0–46.0)
Hemoglobin: 12.5 g/dL (ref 12.0–15.0)
MCH: 26.7 pg (ref 26.0–34.0)
MCHC: 30.8 g/dL (ref 30.0–36.0)
MCV: 86.8 fL (ref 78.0–100.0)
Platelets: 242 10*3/uL (ref 150–400)
RBC: 4.68 MIL/uL (ref 3.87–5.11)
RDW: 16.9 % — ABNORMAL HIGH (ref 11.5–15.5)
WBC: 13.4 10*3/uL — ABNORMAL HIGH (ref 4.0–10.5)

## 2017-11-07 LAB — APTT: aPTT: 31 seconds (ref 24–36)

## 2017-11-07 MED ORDER — RIVAROXABAN 15 MG PO TABS
15.0000 mg | ORAL_TABLET | Freq: Once | ORAL | Status: AC
  Administered 2017-11-07: 15 mg via ORAL
  Filled 2017-11-07: qty 1

## 2017-11-07 MED ORDER — RIVAROXABAN (XARELTO) VTE STARTER PACK (15 & 20 MG): ORAL_TABLET | ORAL | 0 refills | Status: DC

## 2017-11-07 NOTE — ED Notes (Signed)
Patient verbalizes understanding of discharge instructions. Opportunity for questioning and answers were provided. Armband removed by staff, pt discharged from ED via wheelchair.  

## 2017-11-07 NOTE — Progress Notes (Signed)
Left upper extremity venous duplex completed. Positive for an acute DVT in ht einernal jugular, subclavian, axillary, and proximal brachial veins. There is no evidence of a superficial thrombosis. [facility], [place] 11/07/2017 4:11 PM

## 2017-11-07 NOTE — ED Triage Notes (Signed)
PT was in the vascular lab and they found she had clot in her carotid artery.  Pt was told to come immediately down to ED.  She c/o intermittent headaches and L arm pain.

## 2017-11-10 NOTE — ED Provider Notes (Signed)
[facility] EMERGENCY DEPARTMENT Provider Note   CSN: [id number] Arrival date & time: 11/07/17  1603     History   Chief Complaint Chief Complaint  Patient presents with  . of L carotid artery    HPI Sydney Blair is a 62 y.o. female.  HPI  62 year old female referred from the vascular lab after she had a positive DVT in her left upper extremity.  She reports atraumatic pain and swelling in her left arm for the past 2 days.  Progressively worsening.  No acute respiratory complaints.  Past Medical History:  Diagnosis Date  . Anxiety   . ARF (acute renal failure) ([place]) 08/07/2011   ?   Marland Kitchen Arthritis   . Cancer ([place]) 2005   breast-right  . Chronic back pain   . COPD (chronic obstructive pulmonary disease) ([place])   . Depression   . Depression with anxiety   . Dyspnea    on exertion  . Fibromyalgia   . GERD (gastroesophageal reflux disease)   . History of radiation therapy 03/24/17-05/12/17   right lun 2 Gy in 30 fractions  . Hypercholesteremia   . Hypothyroidism   . Hypothyroidism 06/19/2017  . Incontinence   . Neuropathy   . Peripheral neuropathy   . Sciatic pain   . Sebaceous cyst    lt labial location  . Sleep apnea    uses CPAP  . 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 Tobacco abuse     Patient Active Problem List   Diagnosis Date Noted  . Malignant neoplasm of right lung ([place])   . HCAP (healthcare-associated pneumonia) 06/19/2017  . Hypothyroidism 06/19/2017  . Hypomagnesemia 06/19/2017  . Encounter for antineoplastic immunotherapy 06/06/2017  . Chronic fatigue 06/06/2017  . CAP (community acquired pneumonia) 05/26/2017  . Pressure injury of skin 05/18/2017  . Hepatitis 05/16/2017  . Nausea vomiting and diarrhea 05/16/2017  . SIRS (systemic inflammatory response syndrome) ([place]) 05/16/2017  . COPD (chronic obstructive pulmonary disease) ([place]) 05/16/2017  . Abnormal CXR 05/16/2017  . Anemia 05/16/2017    . Thrombocytopenia ([place]) 05/16/2017  . Depression 05/16/2017  . Anxiety 05/16/2017  . Chronic pain 05/16/2017  . Acute on chronic respiratory failure ([place]) 05/16/2017  . 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 Goals of care, counseling/discussion 03/10/2017  . Encounter for antineoplastic chemotherapy 03/10/2017  . Lung mass 02/01/2017  . Breast mass, right   . Hx of adenomatous colonic polyps 12/28/2016  . Fracture of fifth metatarsal bone of right foot with delayed healing 02/03/2016  . IBS (irritable bowel syndrome) 07/30/2015  . Diarrhea 10/29/2014  . Hoarseness 10/29/2014  . Chronic hoarseness 01/16/2014  . Other dysphagia 01/16/2014  . Dysphagia, unspecified(787.20) 06/04/2013  . Unspecified constipation 06/04/2013  . Unsteady gait 01/25/2013  . 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 DOE (dyspnea on exertion) 12/01/2012  . 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 Dysphagia 11/09/2012  . COPD exacerbation ([place]) 08/07/2011  . Hypokalemia 08/07/2011  . Myositis 08/07/2011  . Dehydration 08/07/2011  . ARF (acute renal failure) ([place]) 08/07/2011  . Tubular adenoma 07/20/2011  . INSOMNIA 02/26/2008  . CERUMEN IMPACTION, RIGHT 01/23/2008  . 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 NECK PAIN 11/22/2007  . HAMMER TOE 11/22/2007  . ANXIETY 01/30/2007  . TOBACCO ABUSE 01/30/2007  . DEPRESSION 01/30/2007  . GERD 01/30/2007  . PROLAPSE, VAGINAL WALL, CYSTOCELE, MIDLINE 01/10/2007  .  INCONTINENCE 01/10/2007  . SEBACEOUS CYST 08/17/2006  . Breast cancer, right breast ([place]) 12/01/2003    Past Surgical History:  Procedure Laterality Date  . bladder tack    . BREAST BIOPSY Right 12/29/2016   Procedure: RIGHT BREAST BIOPSY;  Surgeon: Aviva Signs, MD;  Location: [facility];  Service: General;  Laterality: Right;  . BREAST LUMPECTOMY     right with node dissection  .  COLONOSCOPY  11/11/2010   KKX:[id number] rectal polyp and splenic flexure otherwise normal; pathology showed  tubular adenomas. Next TCS 10/2015.  Marland Kitchen ESOPHAGOGASTRODUODENOSCOPY  11/11/2010   IRC:VELFY hiatal hernia, 28 French dilator  . ESOPHAGOGASTRODUODENOSCOPY (EGD) WITH ESOPHAGEAL DILATION N/A 06/21/2013   BOF:BPZWCH esophagus  - status post Maloney dilation s/p bx (benign esophageal bx)  . PORTACATH PLACEMENT Left 07/06/2017   Procedure: INSERTION PORT-A-CATH LEFT SUBCLAVIAN;  Surgeon: Virl Cagey, MD;  Location: [facility];  Service: General;  Laterality: Left;  Marland Kitchen VIDEO BRONCHOSCOPY WITH ENDOBRONCHIAL ULTRASOUND N/A 02/28/2017   Procedure: VIDEO BRONCHOSCOPY WITH ENDOBRONCHIAL ULTRASOUND;  Surgeon: Juanito Doom, MD;  Location: [facility];  Service: Thoracic;  Laterality: N/A;    OB History   None      Home Medications    Prior to Admission medications   Medication Sig Start Date End Date Taking? Authorizing Provider  albuterol (PROVENTIL HFA;VENTOLIN HFA) 108 (90 BASE) MCG/ACT inhaler Inhale 2 puffs into the lungs every 6 (six) hours as needed for wheezing or shortness of breath.    [provider]  budesonide-formoterol (SYMBICORT) 160-4.5 MCG/ACT inhaler Inhale 2 puffs into the lungs daily.     [provider]  Durvalumab (IMFINZI IV) Inject into the vein. Every 2 weeks    [provider]  gabapentin (NEURONTIN) 800 MG tablet Take 800 mg by mouth QID.    [provider]  hydrOXYzine (VISTARIL) 25 MG capsule Take 1 capsule (25 mg total) by mouth 3 (three) times daily as needed for itching. 08/25/17   Jacquelin Hawking, NP  levalbuterol (XOPENEX) 0.63 MG/3ML nebulizer solution Take 3 mLs (0.63 mg total) by nebulization 3 (three) times daily. 05/21/17   Regalado, Belkys A, MD  levofloxacin (LEVAQUIN) 500 MG tablet Take 1 tablet (500 mg total) by mouth daily. 10/26/17   Higgs, Mathis Dad, MD  levothyroxine (SYNTHROID, LEVOTHROID) 100 MCG tablet Take 100 mcg by  mouth daily before breakfast. Reported on 02/03/2016    [provider]  lidocaine-prilocaine (EMLA) cream Apply a quarter size amount to affected area 1 hour prior to coming to chemotherapy. 08/09/17   Twana First, MD  metoprolol tartrate (LOPRESSOR) 50 MG tablet Take 1 tablet (50 mg total) by mouth 2 (two) times daily. 10/26/17   Herminio Commons, MD  ondansetron (ZOFRAN) 8 MG tablet Take 1 tablet (8 mg total) every 8 (eight) hours as needed by mouth for nausea or vomiting. 06/28/17   Twana First, MD  potassium chloride (K-DUR) 10 MEQ tablet Take 4 tablets (40 mEq total) by mouth daily. 09/13/17   Holley Bouche, NP  prochlorperazine (COMPAZINE) 10 MG tablet Take 1 tablet (10 mg total) every 6 (six) hours as needed by mouth for nausea or vomiting. 06/28/17   Twana First, MD  Rivaroxaban 15 & 20 MG TBPK Take as directed on package: Start with one 15mg  tablet by mouth twice a day with food. On Day 22, switch to one 20mg  tablet once a day with food. 11/07/17   Virgel Manifold, MD  rosuvastatin (CRESTOR) 5 MG tablet Take  1 tablet (5 mg total) by mouth daily. 10/26/17 01/24/18  Herminio Commons, MD  tapentadol (NUCYNTA ER) 100 MG 12 hr tablet Take 100 mg by mouth every 12 (twelve) hours.    [provider]  tiotropium (SPIRIVA) 18 MCG inhalation capsule Place 1 capsule (18 mcg total) into inhaler and inhale daily. 01/31/15   Clance, Armando Reichert, MD    Family History Family History  Problem Relation Age of Onset  . Cancer Other   . Heart attack Other   . Lung disease Other   . Emphysema Mother   . Pneumonia Mother   . Cancer Father   . Autoimmune disease Neg Hx     Social History Social History   Tobacco Use  . Smoking status: Current Every Day Smoker    Packs/day: 1.00    Years: 35.00    Pack years: 35.00    Types: Cigarettes  . Smokeless tobacco: Never Used  . Tobacco comment: has cut back to one pack daily  Substance Use Topics  . Alcohol use: No    Alcohol/week:  0.0 oz  . Drug use: No     Allergies   Penicillins   Review of Systems Review of Systems  All systems reviewed and negative, other than as noted in HPI.  Physical Exam Updated Vital Signs BP 133/74   Pulse (!) 101   Temp 99.3 F (37.4 C) (Oral)   Resp (!) 23   Ht 5' (1.524 m)   Wt 65.8 kg (145 lb)   SpO2 90%   BMI 28.32 kg/m   Physical Exam  Constitutional: She appears well-developed and well-nourished. No distress.  HENT:  Head: Normocephalic and atraumatic.  Eyes: Conjunctivae are normal. Right eye exhibits no discharge. Left eye exhibits no discharge.  Neck: Neck supple.  Cardiovascular: Normal rate, regular rhythm and normal heart sounds. Exam reveals no gallop and no friction rub.  No murmur heard. Pulmonary/Chest: Effort normal and breath sounds normal. No respiratory distress.  Abdominal: Soft. She exhibits no distension. There is no tenderness.  Musculoskeletal: She exhibits no edema or tenderness.  Diffuse edema left upper extremity as compared to the right.  Faint violaceous appearance.  Palpable DP pulse.  Hand/fingers.  Neurological: She is alert.  Skin: Skin is warm and dry.  Psychiatric: She has a normal mood and affect. Her behavior is normal. Thought content normal.  Nursing note and vitals reviewed.    ED Treatments / Results  Labs (all labs ordered are listed, but only abnormal results are displayed) Labs Reviewed  CBC - Abnormal; Notable for the following components:      Result Value   WBC 13.4 (*)    RDW 16.9 (*)    All other components within normal limits  DIFFERENTIAL - Abnormal; Notable for the following components:   Neutro Abs 10.9 (*)    All other components within normal limits  COMPREHENSIVE METABOLIC PANEL - Abnormal; Notable for the following components:   Sodium 132 (*)    Chloride 93 (*)    BUN 5 (*)    Calcium 8.4 (*)    Albumin 3.1 (*)    AST 13 (*)    ALT 7 (*)    All other components within normal limits    PROTIME-INR  APTT    EKG  EKG Interpretation  Date/Time:  Monday November 07 2017 17:04:51 EDT Ventricular Rate:  99 PR Interval:  156 QRS Duration: 70 QT Interval:  368 QTC Calculation: 472 R Axis:  96 Text Interpretation:  Normal sinus rhythm Rightward axis Borderline ECG no change from previous Confirmed by Charlesetta Shanks [phone]) on 11/08/2017 3:38:06 PM       Radiology No results found.    Procedures Procedures (including critical care time)  Medications Ordered in ED Medications  Rivaroxaban (XARELTO) tablet 15 mg (15 mg Oral Given 11/07/17 1925)     Initial Impression / Assessment and Plan / ED Course  I have reviewed the triage vital signs and the nursing notes.  Pertinent labs & imaging results that were available during my care of the patient were reviewed by me and considered in my medical decision making (see chart for details).     62 year old female with DVT of left upper extremity.  No acute respiratory complaints.  Xarelto.  Outpatient follow-up.  Final Clinical Impressions(s) / ED Diagnoses   Final diagnoses:  Acute embolism and thrombosis of deep vein of left upper extremity [facility])    ED Discharge Orders        Ordered    Rivaroxaban 15 & 20 MG TBPK     11/07/17 1839       Virgel Manifold, MD 11/10/17 [phone]

## 2017-11-14 ENCOUNTER — Telehealth: Payer: Self-pay | Admitting: Cardiovascular Disease

## 2017-11-14 NOTE — Telephone Encounter (Signed)
Patient would like return phone call regarding PCP putting her on Xarelto due to "blood clot" / tg

## 2017-11-15 NOTE — Telephone Encounter (Signed)
FYI To Dr Bronson Ing, see 11/07/17 ED note

## 2017-11-15 NOTE — Telephone Encounter (Signed)
Upon review of left arm ultrasound: "Positive for a internal jugular deep vein thrombosis. There is an occlusive deep vein thrombosis of the subclavian, axillary, and proximal brachial veins."

## 2017-11-18 ENCOUNTER — Ambulatory Visit (HOSPITAL_COMMUNITY): Payer: Medicaid Other

## 2017-11-18 ENCOUNTER — Ambulatory Visit (HOSPITAL_COMMUNITY): Payer: Medicaid Other | Admitting: Hematology

## 2017-11-22 ENCOUNTER — Telehealth (HOSPITAL_COMMUNITY): Payer: Self-pay

## 2017-11-22 ENCOUNTER — Ambulatory Visit (HOSPITAL_COMMUNITY): Payer: Medicaid Other

## 2017-11-22 NOTE — Telephone Encounter (Signed)
Patient called asking to reschedule her appointment due to fatigue and SOB.  Patient stated she had an ultrasound last week and a clot was found in her arm and was put on Xarelto.  Patient denied arm swelling.  Patient refused to go to the emergency room and stated she was just seen by her primary care "the other day".  She was scheduled for next Tuesday with labs, oncology follow up, and possible treatment.  Patient encouraged to go to the emergency room or call her PCP if she did not want to go to the ER for follow up of SOB.  Patient verbalized understanding.

## 2017-11-25 ENCOUNTER — Emergency Department (HOSPITAL_COMMUNITY): Payer: Medicaid Other

## 2017-11-25 ENCOUNTER — Emergency Department (HOSPITAL_COMMUNITY)
Admission: EM | Admit: 2017-11-25 | Discharge: 2017-11-26 | Disposition: A | Discharge status: Home / Self Care | Payer: Medicaid Other | Attending: Emergency Medicine | Admitting: Emergency Medicine

## 2017-11-25 ENCOUNTER — Telehealth (HOSPITAL_COMMUNITY): Payer: Self-pay

## 2017-11-25 ENCOUNTER — Encounter (HOSPITAL_COMMUNITY): Payer: Self-pay | Admitting: Emergency Medicine

## 2017-11-25 ENCOUNTER — Other Ambulatory Visit: Payer: Self-pay

## 2017-11-25 DIAGNOSIS — R0602 Shortness of breath: Secondary | ICD-10-CM | POA: Diagnosis present

## 2017-11-25 DIAGNOSIS — Z79899 Other long term (current) drug therapy: Secondary | ICD-10-CM | POA: Insufficient documentation

## 2017-11-25 DIAGNOSIS — F17210 Nicotine dependence, cigarettes, uncomplicated: Secondary | ICD-10-CM | POA: Insufficient documentation

## 2017-11-25 DIAGNOSIS — J9610 Chronic respiratory failure, unspecified whether with hypoxia or hypercapnia: Secondary | ICD-10-CM | POA: Diagnosis not present

## 2017-11-25 DIAGNOSIS — Z853 Personal history of malignant neoplasm of breast: Secondary | ICD-10-CM | POA: Insufficient documentation

## 2017-11-25 DIAGNOSIS — J441 Chronic obstructive pulmonary disease with (acute) exacerbation: Secondary | ICD-10-CM | POA: Diagnosis not present

## 2017-11-25 DIAGNOSIS — E039 Hypothyroidism, unspecified: Secondary | ICD-10-CM | POA: Insufficient documentation

## 2017-11-25 DIAGNOSIS — Z85118 Personal history of other malignant neoplasm of bronchus and lung: Secondary | ICD-10-CM | POA: Insufficient documentation

## 2017-11-25 DIAGNOSIS — Z9981 Dependence on supplemental oxygen: Secondary | ICD-10-CM | POA: Insufficient documentation

## 2017-11-25 LAB — BASIC METABOLIC PANEL
Anion gap: 13 (ref 5–15)
BUN: 8 mg/dL (ref 6–20)
CHLORIDE: 95 mmol/L — AB (ref 101–111)
CO2: 26 mmol/L (ref 22–32)
Calcium: 8.4 mg/dL — ABNORMAL LOW (ref 8.9–10.3)
Creatinine, Ser: 0.85 mg/dL (ref 0.44–1.00)
GFR calc Af Amer: >60 mL/min (ref >60)
GFR calc non Af Amer: >60 mL/min (ref >60)
GLUCOSE: 81 mg/dL (ref 65–99)
Potassium: 4.7 mmol/L (ref 3.5–5.1)
Sodium: 134 mmol/L — ABNORMAL LOW (ref 135–145)

## 2017-11-25 LAB — CBC
HCT: 38.7 % (ref 36.0–46.0)
HEMOGLOBIN: 11.3 g/dL — AB (ref 12.0–15.0)
MCH: 25.2 pg — AB (ref 26.0–34.0)
MCHC: 29.2 g/dL — ABNORMAL LOW (ref 30.0–36.0)
MCV: 86.2 fL (ref 78.0–100.0)
Platelets: 218 10*3/uL (ref 150–400)
RBC: 4.49 MIL/uL (ref 3.87–5.11)
RDW: 17 % — ABNORMAL HIGH (ref 11.5–15.5)
WBC: 7.5 10*3/uL (ref 4.0–10.5)

## 2017-11-25 LAB — I-STAT TROPONIN, ED
TROPONIN I, POC: 0 ng/mL (ref 0.00–0.08)
Troponin i, poc: 0 ng/mL (ref 0.00–0.08)

## 2017-11-25 MED ORDER — IPRATROPIUM-ALBUTEROL 0.5-2.5 (3) MG/3ML IN SOLN
3.0000 mL | Freq: Once | RESPIRATORY_TRACT | Status: AC
  Administered 2017-11-25: 3 mL via RESPIRATORY_TRACT
  Filled 2017-11-25: qty 3

## 2017-11-25 NOTE — ED Provider Notes (Signed)
Patient placed in Quick Look pathway, seen and evaluated   Chief Complaint: SOB   HPI: Patient with history of COPD presenting with worsening shortness of breath on exertion.  Diagnosed with a upper extremity DVT 3 weeks ago started on Xarelto and has been compliant with her medications.  Oxygen 2 L/min at home.  She denies any change in her chronic cough or sputum.  ROS: Denies chest pain, nausea vomiting  Physical Exam:   Gen: No distress  Neuro: Awake and Alert  Skin: Warm    Focused Exam: Lungs clear to auscultation, speaking in full sentences. satting 92% to 94% on 2 L/min.   Initiation of care has begun. The patient has been counseled on the process, plan, and necessity for staying for the completion/evaluation, and the remainder of the medical screening examination    Dossie Der 11/25/17 Roxy Cedar, MD 11/26/17 [phone]

## 2017-11-25 NOTE — Telephone Encounter (Signed)
Patient called stating she was SOB and could not do any exertion due to the SOB. She wanted the provider to order a CXR to see if she had a blood clot or something else wrong. Reviewed with provider. Instructed patient to go to the ER if she was having so much trouble breathing. Patient verbalized understanding.

## 2017-11-25 NOTE — ED Notes (Signed)
EKG done in triage, did not cross over in MUSE. Hardcopy available.

## 2017-11-25 NOTE — ED Triage Notes (Signed)
Pt states about 3 weeks ago she was diagnosed with DVT in left upper arm and started on xarelto. Pt has been feeling increasingly SOB ever since. Pt is on 2L [place] at home continuously and has COPD. Denies CP.

## 2017-11-26 MED ORDER — PREDNISONE 20 MG PO TABS
60.0000 mg | ORAL_TABLET | Freq: Once | ORAL | Status: AC
  Administered 2017-11-26: 60 mg via ORAL
  Filled 2017-11-26: qty 3

## 2017-11-26 MED ORDER — PREDNISONE 50 MG PO TABS: ORAL_TABLET | ORAL | 0 refills | Status: DC

## 2017-11-26 MED ORDER — IPRATROPIUM BROMIDE 0.02 % IN SOLN
0.5000 mg | Freq: Once | RESPIRATORY_TRACT | Status: AC
  Administered 2017-11-26: 0.5 mg via RESPIRATORY_TRACT
  Filled 2017-11-26: qty 2.5

## 2017-11-26 MED ORDER — ALBUTEROL SULFATE (2.5 MG/3ML) 0.083% IN NEBU
5.0000 mg | INHALATION_SOLUTION | Freq: Once | RESPIRATORY_TRACT | Status: AC
  Administered 2017-11-26: 5 mg via RESPIRATORY_TRACT
  Filled 2017-11-26: qty 6

## 2017-11-26 NOTE — ED Notes (Signed)
Repeat EKG done per [facility] MD.

## 2017-11-26 NOTE — ED Notes (Signed)
Pt ambulatory to restroom off of oxygen without any difficulties. Pt reports minimal sob. Pt's SpO2 off of O2 after coming back from restroom was in the 70s. 2L [place] placed back on pt and SpO2 went back into the 90s.

## 2017-11-26 NOTE — ED Provider Notes (Signed)
[facility] EMERGENCY DEPARTMENT Provider Note   CSN: [id number] Arrival date & time: 11/25/17  1830     History   Chief Complaint Chief Complaint  Patient presents with  . Shortness of Breath    HPI Sydney Blair is a 62 y.o. female.  The history is provided by the patient and a relative.  Shortness of Breath  This is a recurrent problem. The current episode started more than 2 days ago. The problem has been gradually worsening. Associated symptoms include cough and wheezing. Pertinent negatives include no fever, no sputum production, no hemoptysis, no chest pain, no syncope and no leg swelling. She has tried nothing for the symptoms. Associated medical issues include COPD.   Patient with history of COPD on home oxygen, history of lung cancer, history of smoking presents with shortness of breath.  She reports over the past 2-3 days she been having increasing shortness of breath.  She reports is worse with exertion.  She denies chest pain/hemoptysis No fevers reported. She was recently diagnosed with a DVT has been taking Xarelto, no missed doses reported She did call her PCP on April 5 and was instructed to come to the ER. Past Medical History:  Diagnosis Date  . Anxiety   . ARF (acute renal failure) ([place]) 08/07/2011   ?   Marland Kitchen Arthritis   . Cancer ([place]) 2005   breast-right  . Chronic back pain   . COPD (chronic obstructive pulmonary disease) ([place])   . Depression   . Depression with anxiety   . Dyspnea    on exertion  . Fibromyalgia   . GERD (gastroesophageal reflux disease)   . History of radiation therapy 03/24/17-05/12/17   right lun 2 Gy in 30 fractions  . Hypercholesteremia   . Hypothyroidism   . Hypothyroidism 06/19/2017  . Incontinence   . Neuropathy   . Peripheral neuropathy   . Sciatic pain   . Sebaceous cyst    lt labial location  . Sleep apnea    uses CPAP  . 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  Tobacco abuse     Patient Active Problem List   Diagnosis Date Noted  . Malignant neoplasm of right lung ([place])   . HCAP (healthcare-associated pneumonia) 06/19/2017  . Hypothyroidism 06/19/2017  . Hypomagnesemia 06/19/2017  . Encounter for antineoplastic immunotherapy 06/06/2017  . Chronic fatigue 06/06/2017  . CAP (community acquired pneumonia) 05/26/2017  . Pressure injury of skin 05/18/2017  . Hepatitis 05/16/2017  . Nausea vomiting and diarrhea 05/16/2017  . SIRS (systemic inflammatory response syndrome) ([place]) 05/16/2017  . COPD (chronic obstructive pulmonary disease) ([place]) 05/16/2017  . Abnormal CXR 05/16/2017  . Anemia 05/16/2017  . Thrombocytopenia ([place]) 05/16/2017  . Depression 05/16/2017  . Anxiety 05/16/2017  . Chronic pain 05/16/2017  . Acute on chronic respiratory failure ([place]) 05/16/2017  . 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 Goals of care, counseling/discussion 03/10/2017  . Encounter for antineoplastic chemotherapy 03/10/2017  . Lung mass 02/01/2017  . Breast mass, right   . Hx of adenomatous colonic polyps 12/28/2016  . Fracture of fifth metatarsal bone of right foot with delayed healing 02/03/2016  . IBS (irritable bowel syndrome) 07/30/2015  . Diarrhea 10/29/2014  . Hoarseness 10/29/2014  . Chronic hoarseness 01/16/2014  . Other dysphagia 01/16/2014  . Dysphagia, unspecified(787.20) 06/04/2013  . Unspecified constipation 06/04/2013  . Unsteady gait 01/25/2013  . COPD (chronic obstructive pulmonary disease)  with emphysema ([place]) 12/26/2012  . DOE (dyspnea on exertion) 12/01/2012  . 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 Dysphagia 11/09/2012  . COPD exacerbation ([place]) 08/07/2011  . Hypokalemia 08/07/2011  . Myositis 08/07/2011  . Dehydration 08/07/2011  . ARF (acute renal failure) ([place]) 08/07/2011  . Tubular adenoma 07/20/2011  . INSOMNIA 02/26/2008  . CERUMEN IMPACTION, RIGHT  01/23/2008  . 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 NECK PAIN 11/22/2007  . HAMMER TOE 11/22/2007  . ANXIETY 01/30/2007  . TOBACCO ABUSE 01/30/2007  . DEPRESSION 01/30/2007  . GERD 01/30/2007  . PROLAPSE, VAGINAL WALL, CYSTOCELE, MIDLINE 01/10/2007  . INCONTINENCE 01/10/2007  . SEBACEOUS CYST 08/17/2006  . Breast cancer, right breast ([place]) 12/01/2003    Past Surgical History:  Procedure Laterality Date  . bladder tack    . BREAST BIOPSY Right 12/29/2016   Procedure: RIGHT BREAST BIOPSY;  Surgeon: Aviva Signs, MD;  Location: [facility];  Service: General;  Laterality: Right;  . BREAST LUMPECTOMY     right with node dissection  . COLONOSCOPY  11/11/2010   YHC:WCBJSEGBTD rectal polyp and splenic flexure otherwise normal; pathology showed  tubular adenomas. Next TCS 10/2015.  Marland Kitchen ESOPHAGOGASTRODUODENOSCOPY  11/11/2010   VVO:HYWVP hiatal hernia, 35 French dilator  . ESOPHAGOGASTRODUODENOSCOPY (EGD) WITH ESOPHAGEAL DILATION N/A 06/21/2013   XTG:GYIRSW esophagus  - status post Maloney dilation s/p bx (benign esophageal bx)  . PORTACATH PLACEMENT Left 07/06/2017   Procedure: INSERTION PORT-A-CATH LEFT SUBCLAVIAN;  Surgeon: Virl Cagey, MD;  Location: [facility];  Service: General;  Laterality: Left;  Marland Kitchen VIDEO BRONCHOSCOPY WITH ENDOBRONCHIAL ULTRASOUND N/A 02/28/2017   Procedure: VIDEO BRONCHOSCOPY WITH ENDOBRONCHIAL ULTRASOUND;  Surgeon: Juanito Doom, MD;  Location: [facility];  Service: Thoracic;  Laterality: N/A;     OB History   None      Home Medications    Prior to Admission medications   Medication Sig Start Date End Date Taking? Authorizing Provider  albuterol (PROVENTIL HFA;VENTOLIN HFA) 108 (90 BASE) MCG/ACT inhaler Inhale 2 puffs into the lungs every 6 (six) hours as needed for wheezing or shortness of breath.    [provider]  budesonide-formoterol (SYMBICORT) 160-4.5 MCG/ACT inhaler Inhale 2 puffs into the lungs daily.     [provider]  Durvalumab  (IMFINZI IV) Inject into the vein. Every 2 weeks    [provider]  gabapentin (NEURONTIN) 800 MG tablet Take 800 mg by mouth QID.    [provider]  hydrOXYzine (VISTARIL) 25 MG capsule Take 1 capsule (25 mg total) by mouth 3 (three) times daily as needed for itching. 08/25/17   Jacquelin Hawking, NP  levalbuterol (XOPENEX) 0.63 MG/3ML nebulizer solution Take 3 mLs (0.63 mg total) by nebulization 3 (three) times daily. 05/21/17   Regalado, Belkys A, MD  levofloxacin (LEVAQUIN) 500 MG tablet Take 1 tablet (500 mg total) by mouth daily. 10/26/17   Higgs, Mathis Dad, MD  levothyroxine (SYNTHROID, LEVOTHROID) 100 MCG tablet Take 100 mcg by mouth daily before breakfast. Reported on 02/03/2016    [provider]  lidocaine-prilocaine (EMLA) cream Apply a quarter size amount to affected area 1 hour prior to coming to chemotherapy. 08/09/17   Twana First, MD  metoprolol tartrate (LOPRESSOR) 50 MG tablet Take 1 tablet (50 mg total) by mouth 2 (two) times daily. 10/26/17   Herminio Commons, MD  ondansetron (ZOFRAN) 8 MG tablet Take 1 tablet (8 mg total) every 8 (eight) hours as needed by mouth for nausea or  vomiting. 06/28/17   Twana First, MD  potassium chloride (K-DUR) 10 MEQ tablet Take 4 tablets (40 mEq total) by mouth daily. 09/13/17   Holley Bouche, NP  prochlorperazine (COMPAZINE) 10 MG tablet Take 1 tablet (10 mg total) every 6 (six) hours as needed by mouth for nausea or vomiting. 06/28/17   Twana First, MD  Rivaroxaban 15 & 20 MG TBPK Take as directed on package: Start with one 15mg  tablet by mouth twice a day with food. On Day 22, switch to one 20mg  tablet once a day with food. 11/07/17   Virgel Manifold, MD  rosuvastatin (CRESTOR) 5 MG tablet Take 1 tablet (5 mg total) by mouth daily. 10/26/17 01/24/18  Herminio Commons, MD  tapentadol (NUCYNTA ER) 100 MG 12 hr tablet Take 100 mg by mouth every 12 (twelve) hours.    [provider]  tiotropium (SPIRIVA) 18 MCG  inhalation capsule Place 1 capsule (18 mcg total) into inhaler and inhale daily. 01/31/15   Clance, Armando Reichert, MD    Family History Family History  Problem Relation Age of Onset  . Cancer Other   . Heart attack Other   . Lung disease Other   . Emphysema Mother   . Pneumonia Mother   . Cancer Father   . Autoimmune disease Neg Hx     Social History Social History   Tobacco Use  . Smoking status: Current Every Day Smoker    Packs/day: 1.00    Years: 35.00    Pack years: 35.00    Types: Cigarettes  . Smokeless tobacco: Never Used  . Tobacco comment: has cut back to one pack daily  Substance Use Topics  . Alcohol use: No    Alcohol/week: 0.0 oz  . Drug use: No     Allergies   Penicillins   Review of Systems Review of Systems  Constitutional: Negative for fever.  Respiratory: Positive for cough, shortness of breath and wheezing. Negative for hemoptysis and sputum production.   Cardiovascular: Negative for chest pain, leg swelling and syncope.  Neurological: Negative for syncope.  All other systems reviewed and are negative.    Physical Exam Updated Vital Signs BP (!) 185/99   Pulse 88   Temp 98 F (36.7 C) (Oral)   Resp 14   Ht 1.524 m (5')   Wt 66.2 kg (146 lb)   SpO2 93%   BMI 28.51 kg/m   Physical Exam CONSTITUTIONAL: Elderly, no acute distress HEAD: Normocephalic/atraumatic EYES: EOMI/PERRL ENMT: Mucous membranes moist NECK: supple no meningeal signs SPINE/BACK:entire spine nontender CV: S1/S2 noted, no murmurs/rubs/gallops noted LUNGS: Mild tachypnea noted, wheezing bilaterally, no apparent distress ABDOMEN: soft, nontender, no rebound or guarding, bowel sounds noted throughout abdomen GU:no cva tenderness NEURO: Pt is awake/alert/appropriate, moves all extremitiesx4.  No facial droop.   EXTREMITIES: pulses normal/equal, full ROM no calf tenderness or edema, no significant edema to the left upper extremity SKIN: warm, color normal PSYCH: no  abnormalities of mood noted, alert and oriented to situation   ED Treatments / Results  Labs (all labs ordered are listed, but only abnormal results are displayed) Labs Reviewed  CBC - Abnormal; Notable for the following components:      Result Value   Hemoglobin 11.3 (*)    MCH 25.2 (*)    MCHC 29.2 (*)    RDW 17.0 (*)    All other components within normal limits  BASIC METABOLIC PANEL - Abnormal; Notable for the following components:   Sodium 134 (*)  Chloride 95 (*)    Calcium 8.4 (*)    All other components within normal limits  I-STAT TROPONIN, ED  I-STAT TROPONIN, ED    EKG None See EKG on PA note Radiology Dg Chest 2 View  Result Date: 11/25/2017 CLINICAL DATA:  Shortness of breath for a few days. History of breast cancer. EXAM: CHEST - 2 VIEW COMPARISON:  PET-CT from 11/02/2017 along with radiograph from 10/25/2017 FINDINGS: Cavitary airspace opacity in the right upper lobe appears minimally improved from 10/25/2017 and has associated pleural thickening. On the PET-CT it was postulated that much of this is due to radiation therapy related findings. Based on radiographs, superimposed pneumonia is not excluded, but the appearance does appear mildly improved. Power injectable left Port-A-Cath tip: SVC. Left lung appears clear. There is some continued volume loss in the right lung along with mild chronic deformity on the right from prior rib fractures. Mild enlargement of the cardiopericardial silhouette . No appreciable pleural effusion. IMPRESSION: 1. Minimal improved aeration along the airspace opacity in cavitary process in the right lung apex much of which is likely due to radiation therapy. I do not see any new airspace opacity to suggest a new pneumonia. 2. Mild enlargement of the cardiopericardial silhouette, without edema. Electronically Signed   By: Van Clines M.D.   On: 11/25/2017 19:57    Procedures Procedures   Medications Ordered in ED Medications    ipratropium-albuterol (DUONEB) 0.5-2.5 (3) MG/3ML nebulizer solution 3 mL (3 mLs Nebulization Given 11/25/17 1910)  predniSONE (DELTASONE) tablet 60 mg (60 mg Oral Given 11/26/17 0112)  albuterol (PROVENTIL) (2.5 MG/3ML) 0.083% nebulizer solution 5 mg (5 mg Nebulization Given 11/26/17 0112)  ipratropium (ATROVENT) nebulizer solution 0.5 mg (0.5 mg Nebulization Given 11/26/17 0112)     Initial Impression / Assessment and Plan / ED Course  I have reviewed the triage vital signs and the nursing notes.  Pertinent labs & imaging results that were available during my care of the patient were reviewed by me and considered in my medical decision making (see chart for details).     1:03 AM Patient with known history of lung cancer, COPD and a recent DVT presents with shortness of breath.  She has no chest pain, no hemoptysis.  She has wheezing on exam.  Will give nebulizer treatments and prednisone. She admits this is similar to prior episodes. We discussed the possibility of a blood clot in her lungs, we could proceed with a CT scan of her chest.  She prefers not to at this time to and just continue her Xarelto.  She would like to just be treated for COPD.  Of note, patient walked to the bathroom in no distress, but her oxygen did drop but she is supposed to wear oxygen while ambulating 2:08 AM Patient reports feeling improved.  There is residual wheeze, but she is in no distress. Chest x-ray reviewed and no change from prior I did inform her that she is at high risk with the Port-A-Cath in, and that that is likely the cause of her upper extremity DVT Although Xarelto is not first choice treatment for this, she appears to be tolerating this well for past several weeks and her swelling in her left upper extremity is improved. Again she declines further workup/CT chest, and will follow up with her PCP next week.  She reports this feels similar to prior COPD exacerbations  I discussed at length strict ER  return precautions with her and her sister at  bedside Final Clinical Impressions(s) / ED Diagnoses   Final diagnoses:  COPD exacerbation [facility])    ED Discharge Orders        Ordered    predniSONE (DELTASONE) 50 MG tablet     11/26/17 0201       Ripley Fraise, MD 11/26/17 0211

## 2017-11-26 NOTE — ED Notes (Signed)
Pt verbalizes understanding of d/c instructions. Pt received prescriptions. Pt taken to lobby in wheelchair at d/c with all belongings.   

## 2017-11-26 NOTE — ED Notes (Signed)
ED Provider at bedside. 

## 2017-11-28 ENCOUNTER — Other Ambulatory Visit (HOSPITAL_COMMUNITY): Payer: Self-pay | Admitting: Adult Health

## 2017-11-28 DIAGNOSIS — E876 Hypokalemia: Secondary | ICD-10-CM

## 2017-11-28 DIAGNOSIS — C3491 Malignant neoplasm of unspecified part of right bronchus or lung: Secondary | ICD-10-CM

## 2017-11-29 ENCOUNTER — Inpatient Hospital Stay (HOSPITAL_COMMUNITY): Payer: Medicaid Other

## 2017-11-29 ENCOUNTER — Encounter (HOSPITAL_COMMUNITY): Payer: Self-pay | Admitting: Hematology

## 2017-11-29 ENCOUNTER — Inpatient Hospital Stay (HOSPITAL_COMMUNITY): Payer: Medicaid Other | Attending: Hematology | Admitting: Hematology

## 2017-11-29 VITALS — BP 114/61 | HR 89 | Temp 98.0°F | Resp 20 | Wt 153.6 lb

## 2017-11-29 VITALS — BP 140/83 | HR 84 | Temp 98.1°F | Resp 20

## 2017-11-29 DIAGNOSIS — C3490 Malignant neoplasm of unspecified part of unspecified bronchus or lung: Secondary | ICD-10-CM

## 2017-11-29 DIAGNOSIS — Z86718 Personal history of other venous thrombosis and embolism: Secondary | ICD-10-CM | POA: Diagnosis not present

## 2017-11-29 DIAGNOSIS — Z79899 Other long term (current) drug therapy: Secondary | ICD-10-CM | POA: Insufficient documentation

## 2017-11-29 DIAGNOSIS — E039 Hypothyroidism, unspecified: Secondary | ICD-10-CM | POA: Diagnosis not present

## 2017-11-29 DIAGNOSIS — Z7901 Long term (current) use of anticoagulants: Secondary | ICD-10-CM | POA: Diagnosis not present

## 2017-11-29 DIAGNOSIS — R42 Dizziness and giddiness: Secondary | ICD-10-CM | POA: Diagnosis not present

## 2017-11-29 DIAGNOSIS — Z853 Personal history of malignant neoplasm of breast: Secondary | ICD-10-CM | POA: Diagnosis not present

## 2017-11-29 DIAGNOSIS — J449 Chronic obstructive pulmonary disease, unspecified: Secondary | ICD-10-CM | POA: Diagnosis not present

## 2017-11-29 DIAGNOSIS — F17210 Nicotine dependence, cigarettes, uncomplicated: Secondary | ICD-10-CM | POA: Diagnosis not present

## 2017-11-29 DIAGNOSIS — R200 Anesthesia of skin: Secondary | ICD-10-CM | POA: Diagnosis not present

## 2017-11-29 DIAGNOSIS — C3411 Malignant neoplasm of upper lobe, right bronchus or lung: Secondary | ICD-10-CM | POA: Diagnosis not present

## 2017-11-29 DIAGNOSIS — L299 Pruritus, unspecified: Secondary | ICD-10-CM | POA: Insufficient documentation

## 2017-11-29 DIAGNOSIS — R530 Neoplastic (malignant) related fatigue: Secondary | ICD-10-CM | POA: Diagnosis not present

## 2017-11-29 DIAGNOSIS — Z5112 Encounter for antineoplastic immunotherapy: Secondary | ICD-10-CM | POA: Insufficient documentation

## 2017-11-29 DIAGNOSIS — E876 Hypokalemia: Secondary | ICD-10-CM

## 2017-11-29 DIAGNOSIS — C3491 Malignant neoplasm of unspecified part of right bronchus or lung: Secondary | ICD-10-CM

## 2017-11-29 LAB — TSH: TSH: 0.756 u[IU]/mL (ref 0.350–4.500)

## 2017-11-29 LAB — CBC WITH DIFFERENTIAL/PLATELET
Basophils Absolute: 0 10*3/uL (ref 0.0–0.1)
Basophils Relative: 0 %
EOS ABS: 0 10*3/uL (ref 0.0–0.7)
EOS PCT: 0 %
HCT: 37.3 % (ref 36.0–46.0)
Hemoglobin: 11.2 g/dL — ABNORMAL LOW (ref 12.0–15.0)
Lymphocytes Relative: 10 %
Lymphs Abs: 0.8 10*3/uL (ref 0.7–4.0)
MCH: 25.7 pg — AB (ref 26.0–34.0)
MCHC: 30 g/dL (ref 30.0–36.0)
MCV: 85.7 fL (ref 78.0–100.0)
MONO ABS: 0.7 10*3/uL (ref 0.1–1.0)
MONOS PCT: 9 %
Neutro Abs: 6.4 10*3/uL (ref 1.7–7.7)
Neutrophils Relative %: 81 %
PLATELETS: 214 10*3/uL (ref 150–400)
RBC: 4.35 MIL/uL (ref 3.87–5.11)
RDW: 16.8 % — AB (ref 11.5–15.5)
WBC: 8 10*3/uL (ref 4.0–10.5)

## 2017-11-29 LAB — COMPREHENSIVE METABOLIC PANEL
ALT: 6 U/L — AB (ref 14–54)
AST: 12 U/L — AB (ref 15–41)
Albumin: 3.1 g/dL — ABNORMAL LOW (ref 3.5–5.0)
Alkaline Phosphatase: 75 U/L (ref 38–126)
Anion gap: 12 (ref 5–15)
BILIRUBIN TOTAL: 0.6 mg/dL (ref 0.3–1.2)
BUN: 8 mg/dL (ref 6–20)
CO2: 28 mmol/L (ref 22–32)
Calcium: 8.3 mg/dL — ABNORMAL LOW (ref 8.9–10.3)
Chloride: 96 mmol/L — ABNORMAL LOW (ref 101–111)
Creatinine, Ser: 0.71 mg/dL (ref 0.44–1.00)
GFR calc Af Amer: >60 mL/min (ref >60)
Glucose, Bld: 97 mg/dL (ref 65–99)
POTASSIUM: 3.1 mmol/L — AB (ref 3.5–5.1)
Sodium: 136 mmol/L (ref 135–145)
TOTAL PROTEIN: 6.7 g/dL (ref 6.5–8.1)

## 2017-11-29 MED ORDER — HEPARIN SOD (PORK) LOCK FLUSH 100 UNIT/ML IV SOLN
500.0000 [IU] | Freq: Once | INTRAVENOUS | Status: AC | PRN
  Administered 2017-11-29: 500 [IU]
  Filled 2017-11-29: qty 5

## 2017-11-29 MED ORDER — ALBUTEROL SULFATE (2.5 MG/3ML) 0.083% IN NEBU: 2.5000 mg | INHALATION_SOLUTION | Freq: Four times a day (QID) | RESPIRATORY_TRACT | 12 refills | Status: DC | PRN

## 2017-11-29 MED ORDER — SODIUM CHLORIDE 0.9% FLUSH
10.0000 mL | INTRAVENOUS | Status: DC | PRN
  Administered 2017-11-29: 10 mL
  Filled 2017-11-29: qty 10

## 2017-11-29 MED ORDER — MISC. DEVICES MISC
0 refills | Status: AC
Start: 2017-11-29 — End: ?

## 2017-11-29 MED ORDER — SODIUM CHLORIDE 0.9 % IV SOLN
Freq: Once | INTRAVENOUS | Status: AC
  Administered 2017-11-29: 500 mL via INTRAVENOUS

## 2017-11-29 MED ORDER — SODIUM CHLORIDE 0.9 % IV SOLN
620.0000 mg | Freq: Once | INTRAVENOUS | Status: AC
  Administered 2017-11-29: 620 mg via INTRAVENOUS
  Filled 2017-11-29: qty 10

## 2017-11-29 MED ORDER — POTASSIUM CHLORIDE ER 10 MEQ PO TBCR: 20.0000 meq | EXTENDED_RELEASE_TABLET | Freq: Every day | ORAL | 0 refills | Status: AC

## 2017-11-29 NOTE — Progress Notes (Signed)
To treatment room after oncology follow up visit.  Neuropathy with no worsening.  Able to perform ADL's without difficulty.  Can use buttons, zippers, and jar lids without difficulty.  Stated SOB with exertion but feeling better from last week and ER visit.    Patient tolerated treatment with no complaints voiced.  Port site clean and dry with no bruising or swelling noted at site.  No complaints of pain with flush.  VSS with discharge and left via wheelchair with family.  No s/s of distress noted.

## 2017-11-29 NOTE — Progress Notes (Signed)
[address] [place], [place] [address]   CLINIC:  Medical Oncology/Hematology  PCP:  [name], MD [place] [address] [phone]   REASON FOR VISIT:  Follow-up for stage III lung cancer.  CURRENT THERAPY: Durvalumab every 2 weeks consolidation.  BRIEF ONCOLOGIC HISTORY:  Oncology History   Patient presented with incidental finding on pre-op CXR for a breast biopsy.     Lung mass   12/08/2016 Imaging    CXR IMPRESSION: 5 cm masslike opacity within the right upper lobe -chest CT with contrast is recommended for further evaluation.      02/01/2017 Initial Diagnosis    Lung mass      02/11/2017 Imaging    PET IMPRESSION: 1. Hypermetabolic RIGHT upper lobe mass consists with bronchogenic carcinoma. 2. Hypermetabolic RIGHT hilar and ipsilateral RIGHT paratracheal metastatic adenopathy. 3. No contralateral nodal metastasis or supraclavicular nodal metastasis.      02/28/2017 Surgery    Operation: Flexible video fiberoptic bronchoscopy with endobronchial ultrasound and biopsies.      02/28/2017 Pathology Results    Cytology Diagnosis BRONCHIAL BRUSHING SPECIMEN D, RIGHT UPPER LOBE (SPECIMEN 4 OF 4 COLLECTED 02/28/2017) MALIGNANT CELLS CONSISTENT WITH SQUAMOUS CELL CARCINOMA.          INTERVAL HISTORY:  Sydney Blair 62 y.o. female returns for next cycle of Durvalumab.  She missed last week as she went to the ER with COPD exacerbation.  Her breathing has improved.  However she complains of easy shortness of breath on exertion for the past few months.  She is tolerating Xarelto very well.  No bleeding episodes noted.  She will switch to once a day dosing today.  She is using Spiriva, Symbicort and albuterol inhalers.  She denies any diarrhea.  She does have some itching of her skin.  Her tiredness is stable.  Numbness in the fingertips and feet has also been stable.  She is still continuing to smoke 1 pack/day of  cigarettes.  She uses 2 L/min oxygen by nasal cannula.  REVIEW OF SYSTEMS:  Review of Systems  Constitutional: Positive for fatigue.  Skin: Positive for itching. Negative for rash.  Neurological: Positive for dizziness and numbness.  All other systems reviewed and are negative.    PAST MEDICAL/SURGICAL HISTORY:  Past Medical History:  Diagnosis Date  . Anxiety   . ARF (acute renal failure) ([facility]) 08/07/2011   ?   Marland Kitchen Arthritis   . Cancer ([facility]) 2005   breast-right  . Chronic back pain   . COPD (chronic obstructive pulmonary disease) ([facility])   . Depression   . Depression with anxiety   . Dyspnea    on exertion  . Fibromyalgia   . GERD (gastroesophageal reflux disease)   . History of radiation therapy 03/24/17-05/12/17   right lun 2 Gy in 30 fractions  . Hypercholesteremia   . Hypothyroidism   . Hypothyroidism 06/19/2017  . Incontinence   . Neuropathy   . Peripheral neuropathy   . Sciatic pain   . Sebaceous cyst    lt labial location  . Sleep apnea    uses CPAP  . Stage III squamous cell carcinoma of right lung ([facility]) 03/10/2017  . Tobacco abuse    Past Surgical History:  Procedure Laterality Date  . bladder tack    . BREAST BIOPSY Right 12/29/2016   Procedure: RIGHT BREAST BIOPSY;  Surgeon: Aviva Signs, MD;  Location: [facility];  Service: General;  Laterality: Right;  . BREAST  LUMPECTOMY     right with node dissection  . COLONOSCOPY  11/11/2010   XNA:[id number] rectal polyp and splenic flexure otherwise normal; pathology showed  tubular adenomas. Next TCS 10/2015.  Marland Kitchen ESOPHAGOGASTRODUODENOSCOPY  11/11/2010   [facility]:CWCBJ hiatal hernia, 75 French dilator  . ESOPHAGOGASTRODUODENOSCOPY (EGD) WITH ESOPHAGEAL DILATION N/A 06/21/2013   SEG:BTDVVO esophagus  - status post Maloney dilation s/p bx (benign esophageal bx)  . PORTACATH PLACEMENT Left 07/06/2017   Procedure: INSERTION PORT-A-CATH LEFT SUBCLAVIAN;  Surgeon: Virl Cagey, MD;  Location: [facility];  Service: General;   Laterality: Left;  Marland Kitchen VIDEO BRONCHOSCOPY WITH ENDOBRONCHIAL ULTRASOUND N/A 02/28/2017   Procedure: VIDEO BRONCHOSCOPY WITH ENDOBRONCHIAL ULTRASOUND;  Surgeon: Juanito Doom, MD;  Location: [facility];  Service: Thoracic;  Laterality: N/A;     SOCIAL HISTORY:  Social History   Socioeconomic History  . Marital status: Married    Spouse name: Not on file  . Number of children: 1  . Years of education: Not on file  . Highest education level: Not on file  Occupational History  . Occupation: disabled    Fish farm manager: UNEMPLOYED  Social Needs  . Financial resource strain: Not on file  . Food insecurity:    Worry: Not on file    Inability: Not on file  . Transportation needs:    Medical: Not on file    Non-medical: Not on file  Tobacco Use  . Smoking status: Current Every Day Smoker    Packs/day: 1.00    Years: 35.00    Pack years: 35.00    Types: Cigarettes  . Smokeless tobacco: Never Used  . Tobacco comment: has cut back to one pack daily  Substance and Sexual Activity  . Alcohol use: No    Alcohol/week: 0.0 oz  . Drug use: No  . Sexual activity: Yes    Birth control/protection: Post-menopausal    Comment: chemo stopped her periods  Lifestyle  . Physical activity:    Days per week: Not on file    Minutes per session: Not on file  . Stress: Not on file  Relationships  . Social connections:    Talks on phone: Not on file    Gets together: Not on file    Attends religious service: Not on file    Active member of club or organization: Not on file    Attends meetings of clubs or organizations: Not on file    Relationship status: Not on file  . Intimate partner violence:    Fear of current or ex partner: Not on file    Emotionally abused: Not on file    Physically abused: Not on file    Forced sexual activity: Not on file  Other Topics Concern  . Not on file  Social History Narrative   Lives w. Husband   Lost 1 son-?etiology    FAMILY HISTORY:  Family History  Problem  Relation Age of Onset  . Cancer Other   . Heart attack Other   . Lung disease Other   . Emphysema Mother   . Pneumonia Mother   . Cancer Father   . Autoimmune disease Neg Hx     CURRENT MEDICATIONS:  Outpatient Encounter Medications as of 11/29/2017  Medication Sig Note  . albuterol (PROVENTIL HFA;VENTOLIN HFA) 108 (90 BASE) MCG/ACT inhaler Inhale 2 puffs into the lungs every 6 (six) hours as needed for wheezing or shortness of breath.   . budesonide-formoterol (SYMBICORT) 160-4.5 MCG/ACT inhaler Inhale 2 puffs into the lungs  daily.    . gabapentin (NEURONTIN) 800 MG tablet Take 800 mg by mouth 4 (four) times daily.    . hydrOXYzine (VISTARIL) 25 MG capsule Take 1 capsule (25 mg total) by mouth 3 (three) times daily as needed for itching.   . levalbuterol (XOPENEX) 0.63 MG/3ML nebulizer solution Take 3 mLs (0.63 mg total) by nebulization 3 (three) times daily.   Marland Kitchen levofloxacin (LEVAQUIN) 500 MG tablet Take 1 tablet (500 mg total) by mouth daily. (Patient not taking: Reported on 11/26/2017)   . levothyroxine (SYNTHROID, LEVOTHROID) 100 MCG tablet Take 100 mcg by mouth daily before breakfast. Reported on 02/03/2016   . lidocaine-prilocaine (EMLA) cream Apply a quarter size amount to affected area 1 hour prior to coming to chemotherapy. (Patient not taking: Reported on 11/26/2017)   . metoprolol tartrate (LOPRESSOR) 50 MG tablet Take 1 tablet (50 mg total) by mouth 2 (two) times daily.   . ondansetron (ZOFRAN) 8 MG tablet Take 1 tablet (8 mg total) every 8 (eight) hours as needed by mouth for nausea or vomiting.   . potassium chloride (K-DUR) 10 MEQ tablet Take 2 tablets (20 mEq total) by mouth daily.   . predniSONE (DELTASONE) 50 MG tablet One tablet PO daily for 4 days   . prochlorperazine (COMPAZINE) 10 MG tablet Take 1 tablet (10 mg total) every 6 (six) hours as needed by mouth for nausea or vomiting.   . Rivaroxaban 15 & 20 MG TBPK Take as directed on package: Start with one 15mg  tablet by mouth  twice a day with food. On Day 22, switch to one 20mg  tablet once a day with food. 11/26/2017: Started on 11/08/17  . rosuvastatin (CRESTOR) 5 MG tablet Take 1 tablet (5 mg total) by mouth daily.   . tapentadol (NUCYNTA ER) 100 MG 12 hr tablet Take 100 mg by mouth every 12 (twelve) hours.   Marland Kitchen tiotropium (SPIRIVA) 18 MCG inhalation capsule Place 1 capsule (18 mcg total) into inhaler and inhale daily.   . [DISCONTINUED] potassium chloride (K-DUR) 10 MEQ tablet Take 4 tablets (40 mEq total) by mouth daily.   . [DISCONTINUED] potassium chloride (K-DUR) 10 MEQ tablet TAKE 4 TABLETS BY MOUTH DAILY.    No facility-administered encounter medications on file as of 11/29/2017.     ALLERGIES:  Allergies  Allergen Reactions  . Penicillins Anaphylaxis and Other (See Comments)    Has patient had a PCN reaction causing immediate rash, facial/tongue/throat swelling, SOB or lightheadedness with hypotension:Yes Has patient had a PCN reaction causing severe rash involving mucus membranes or skin necrosis:Yes Has patient had a PCN reaction that required hospitalization:No Has patient had a PCN reaction occurring within the last 10 years:No If all of the above answers are "NO", then may proceed with Cephalosporin use.      PHYSICAL EXAM:  ECOG Performance status: 2  There were no vitals filed for this visit. There were no vitals filed for this visit.    LABORATORY DATA:  I have reviewed the labs as listed.  CBC    Component Value Date/Time   WBC 8.0 11/29/2017 0810   RBC 4.35 11/29/2017 0810   HGB 11.2 (L) 11/29/2017 0810   HGB 8.9 (L) 06/03/2017 0804   HCT 37.3 11/29/2017 0810   HCT 27.8 (L) 06/03/2017 0804   PLT 214 11/29/2017 0810   PLT 114 (L) 06/03/2017 0804   MCV 85.7 11/29/2017 0810   MCV 99.3 06/03/2017 0804   MCH 25.7 (L) 11/29/2017 0810   MCHC 30.0  11/29/2017 0810   RDW 16.8 (H) 11/29/2017 0810   RDW 23.7 (H) 06/03/2017 0804   LYMPHSABS 0.8 11/29/2017 0810   LYMPHSABS 0.9 06/03/2017  0804   MONOABS 0.7 11/29/2017 0810   MONOABS 0.3 06/03/2017 0804   EOSABS 0.0 11/29/2017 0810   EOSABS 0.2 06/03/2017 0804   BASOSABS 0.0 11/29/2017 0810   BASOSABS 0.0 06/03/2017 0804   CMP Latest Ref Rng & Units 11/29/2017 11/25/2017 11/07/2017  Glucose 65 - 99 mg/dL 97 81 89  BUN 6 - 20 mg/dL 8 8 5(L)  Creatinine 0.44 - 1.00 mg/dL 0.71 0.85 0.71  Sodium 135 - 145 mmol/L 136 134(L) 132(L)  Potassium 3.5 - 5.1 mmol/L 3.1(L) 4.7 3.6  Chloride 101 - 111 mmol/L 96(L) 95(L) 93(L)  CO2 22 - 32 mmol/L 28 26 28   Calcium 8.9 - 10.3 mg/dL 8.3(L) 8.4(L) 8.4(L)  Total Protein 6.5 - 8.1 g/dL 6.7 - 7.1  Total Bilirubin 0.3 - 1.2 mg/dL 0.6 - 1.0  Alkaline Phos 38 - 126 U/L 75 - 85  AST 15 - 41 U/L 12(L) - 13(L)  ALT 14 - 54 U/L 6(L) - 7(L)       DIAGNOSTIC IMAGING:  I have reviewed her Doppler dated 11/07/2017 which showed left upper extremity deep vein thrombosis.     ASSESSMENT & PLAN:   Stage III squamous cell carcinoma of right lung (HCC) 1.  Stage III non-small cell lung cancer, adenocarcinoma: - Chemoradiation therapy with weekly carboplatin and paclitaxel from 03/21/2017 through 05/02/2017, currently undergoing consolidation with Durvalumab -PET/CT scan on 11/02/2017 shows overall treatment response with residual relatively hypometabolic cavitary lesion in the posterior segment, right upper lobe and surrounding hypermetabolic evolutionary changes of radiation therapy, mildly hypermetabolic subcarinal lymph node -Patient presented to emergency room with COPD exacerbation last week - She may proceed with her next dose of Durvalumab today.  TSH is pending.  2.  Left upper extremity DVT: Date of diagnosis 11/07/2017.  Likely port induced.  Currently on Xarelto, today she will start once a day dosing 20 mg with food.  So far tolerating very well.  Swelling of the left upper extremity has come down.  3.  Remote history of right breast cancer: Diagnosed in 2005, treated with lumpectomy, adjuvant  chemotherapy and radiation.  Last mammogram was in January 2019, BI-RADS Category 2.  4.  Hypothyroidism: She is currently on Synthroid 100 mcg daily.  TSH from today is pending.  5.COPD exacerbation: We will try to get her nebulizer with albuterol at home.    Orders placed this encounter:  Orders Placed This Encounter  Procedures  . CBC  . Comprehensive metabolic panel      Derek Jack, MD [place] [phone]

## 2017-11-29 NOTE — Patient Instructions (Signed)
[facility] Discharge Instructions for Patients Receiving Chemotherapy  Today you received the following chemotherapy agents infinzi.    If you develop nausea and vomiting that is not controlled by your nausea medication, call the clinic.   BELOW ARE SYMPTOMS THAT SHOULD BE REPORTED IMMEDIATELY:  *FEVER GREATER THAN 100.5 F  *CHILLS WITH OR WITHOUT FEVER  NAUSEA AND VOMITING THAT IS NOT CONTROLLED WITH YOUR NAUSEA MEDICATION  *UNUSUAL SHORTNESS OF BREATH  *UNUSUAL BRUISING OR BLEEDING  TENDERNESS IN MOUTH AND THROAT WITH OR WITHOUT PRESENCE OF ULCERS  *URINARY PROBLEMS  *BOWEL PROBLEMS  UNUSUAL RASH Items with * indicate a potential emergency and should be followed up as soon as possible.  Feel free to call the clinic should you have any questions or concerns. The clinic phone number is [phone].  Please show the [facility] at check-in to the Emergency Department and triage nurse.

## 2017-11-29 NOTE — Assessment & Plan Note (Signed)
1.  Stage III non-small cell lung cancer, adenocarcinoma: - Chemoradiation therapy with weekly carboplatin and paclitaxel from 03/21/2017 through 05/02/2017, currently undergoing consolidation with Durvalumab -PET/CT scan on 11/02/2017 shows overall treatment response with residual relatively hypometabolic cavitary lesion in the posterior segment, right upper lobe and surrounding hypermetabolic evolutionary changes of radiation therapy, mildly hypermetabolic subcarinal lymph node -Patient presented to emergency room with COPD exacerbation last week - She may proceed with her next dose of Durvalumab today.  TSH is pending.  2.  Left upper extremity DVT: Date of diagnosis 11/07/2017.  Likely port induced.  Currently on Xarelto, today she will start once a day dosing 20 mg with food.  So far tolerating very well.  Swelling of the left upper extremity has come down.  3.  Remote history of right breast cancer: Diagnosed in 2005, treated with lumpectomy, adjuvant chemotherapy and radiation.  Last mammogram was in January 2019, BI-RADS Category 2.  4.  Hypothyroidism: She is currently on Synthroid 100 mcg daily.  TSH from today is pending.

## 2017-11-29 NOTE — Addendum Note (Signed)
Addended by: Jaynie Collins R on: 11/29/2017 10:13 AM   Modules accepted: Orders

## 2017-12-06 ENCOUNTER — Other Ambulatory Visit (HOSPITAL_COMMUNITY): Payer: Medicaid Other

## 2017-12-06 ENCOUNTER — Ambulatory Visit (HOSPITAL_COMMUNITY): Payer: Medicaid Other

## 2017-12-06 ENCOUNTER — Ambulatory Visit (HOSPITAL_COMMUNITY): Payer: Medicaid Other | Admitting: Hematology

## 2017-12-14 ENCOUNTER — Ambulatory Visit (HOSPITAL_COMMUNITY): Payer: Medicaid Other

## 2017-12-14 ENCOUNTER — Other Ambulatory Visit (HOSPITAL_COMMUNITY): Payer: Medicaid Other

## 2017-12-19 ENCOUNTER — Inpatient Hospital Stay (HOSPITAL_COMMUNITY): Payer: Medicaid Other

## 2017-12-19 ENCOUNTER — Encounter (HOSPITAL_COMMUNITY): Payer: Self-pay

## 2017-12-19 VITALS — BP 149/94 | HR 90 | Temp 97.4°F | Resp 20 | Wt 145.8 lb

## 2017-12-19 DIAGNOSIS — Z5112 Encounter for antineoplastic immunotherapy: Secondary | ICD-10-CM | POA: Diagnosis not present

## 2017-12-19 DIAGNOSIS — E876 Hypokalemia: Secondary | ICD-10-CM

## 2017-12-19 DIAGNOSIS — C3491 Malignant neoplasm of unspecified part of right bronchus or lung: Secondary | ICD-10-CM

## 2017-12-19 LAB — DIFFERENTIAL
Basophils Absolute: 0 10*3/uL (ref 0.0–0.1)
Basophils Relative: 1 %
EOS ABS: 0.1 10*3/uL (ref 0.0–0.7)
EOS PCT: 1 %
LYMPHS ABS: 0.7 10*3/uL (ref 0.7–4.0)
Lymphocytes Relative: 10 %
Monocytes Absolute: 0.6 10*3/uL (ref 0.1–1.0)
Monocytes Relative: 9 %
NEUTROS PCT: 79 %
Neutro Abs: 5.7 10*3/uL (ref 1.7–7.7)

## 2017-12-19 LAB — COMPREHENSIVE METABOLIC PANEL
ALBUMIN: 3.5 g/dL (ref 3.5–5.0)
ALT: 9 U/L — AB (ref 14–54)
AST: 16 U/L (ref 15–41)
Alkaline Phosphatase: 85 U/L (ref 38–126)
Anion gap: 12 (ref 5–15)
BUN: 5 mg/dL — AB (ref 6–20)
CHLORIDE: 95 mmol/L — AB (ref 101–111)
CO2: 29 mmol/L (ref 22–32)
CREATININE: 0.75 mg/dL (ref 0.44–1.00)
Calcium: 8.6 mg/dL — ABNORMAL LOW (ref 8.9–10.3)
GFR calc Af Amer: >60 mL/min (ref >60)
GFR calc non Af Amer: >60 mL/min (ref >60)
Glucose, Bld: 104 mg/dL — ABNORMAL HIGH (ref 65–99)
POTASSIUM: 2.7 mmol/L — AB (ref 3.5–5.1)
SODIUM: 136 mmol/L (ref 135–145)
Total Bilirubin: 1.1 mg/dL (ref 0.3–1.2)
Total Protein: 7.5 g/dL (ref 6.5–8.1)

## 2017-12-19 LAB — CBC
HCT: 42.4 % (ref 36.0–46.0)
Hemoglobin: 12.7 g/dL (ref 12.0–15.0)
MCH: 25.6 pg — ABNORMAL LOW (ref 26.0–34.0)
MCHC: 30 g/dL (ref 30.0–36.0)
MCV: 85.5 fL (ref 78.0–100.0)
PLATELETS: 205 10*3/uL (ref 150–400)
RBC: 4.96 MIL/uL (ref 3.87–5.11)
RDW: 16.8 % — AB (ref 11.5–15.5)
WBC: 7.2 10*3/uL (ref 4.0–10.5)

## 2017-12-19 MED ORDER — SODIUM CHLORIDE 0.9 % IV SOLN
620.0000 mg | Freq: Once | INTRAVENOUS | Status: AC
  Administered 2017-12-19: 620 mg via INTRAVENOUS
  Filled 2017-12-19: qty 10

## 2017-12-19 MED ORDER — POTASSIUM CHLORIDE 10 MEQ/100ML IV SOLN
10.0000 meq | INTRAVENOUS | Status: AC
  Administered 2017-12-19 (×2): 10 meq via INTRAVENOUS
  Filled 2017-12-19 (×2): qty 100

## 2017-12-19 MED ORDER — HEPARIN SOD (PORK) LOCK FLUSH 100 UNIT/ML IV SOLN
INTRAVENOUS | Status: AC
  Filled 2017-12-19: qty 5

## 2017-12-19 MED ORDER — POTASSIUM CHLORIDE CRYS ER 20 MEQ PO TBCR
40.0000 meq | EXTENDED_RELEASE_TABLET | Freq: Once | ORAL | Status: AC
  Administered 2017-12-19: 40 meq via ORAL

## 2017-12-19 MED ORDER — SODIUM CHLORIDE 0.9% FLUSH
10.0000 mL | INTRAVENOUS | Status: DC | PRN
  Administered 2017-12-19: 10 mL
  Filled 2017-12-19: qty 10

## 2017-12-19 MED ORDER — SODIUM CHLORIDE 0.9 % IV SOLN
Freq: Once | INTRAVENOUS | Status: AC
  Administered 2017-12-19: 500 mL via INTRAVENOUS

## 2017-12-19 MED ORDER — HEPARIN SOD (PORK) LOCK FLUSH 100 UNIT/ML IV SOLN
500.0000 [IU] | Freq: Once | INTRAVENOUS | Status: AC | PRN
  Administered 2017-12-19: 500 [IU]

## 2017-12-19 MED ORDER — POTASSIUM CHLORIDE CRYS ER 20 MEQ PO TBCR
EXTENDED_RELEASE_TABLET | ORAL | Status: AC
  Filled 2017-12-19: qty 2

## 2017-12-19 NOTE — Progress Notes (Signed)
CRITICAL VALUE ALERT Critical value received:  Potassium 2.7  Date of notification:  12-19-2017  Time of notification: 5537 Critical value read back:  Yes.   Nurse who received alert:  C.Jumaane Weatherford RN MD notified (1st Lavoris Canizales):  Dr. Delton Coombes

## 2017-12-19 NOTE — Patient Instructions (Signed)
[facility] Discharge Instructions for Patients Receiving Chemotherapy  Today you received the following chemotherapy agents imfinzi.   If you develop nausea and vomiting that is not controlled by your nausea medication, call the clinic.   BELOW ARE SYMPTOMS THAT SHOULD BE REPORTED IMMEDIATELY:  *FEVER GREATER THAN 100.5 F  *CHILLS WITH OR WITHOUT FEVER  NAUSEA AND VOMITING THAT IS NOT CONTROLLED WITH YOUR NAUSEA MEDICATION  *UNUSUAL SHORTNESS OF BREATH  *UNUSUAL BRUISING OR BLEEDING  TENDERNESS IN MOUTH AND THROAT WITH OR WITHOUT PRESENCE OF ULCERS  *URINARY PROBLEMS  *BOWEL PROBLEMS  UNUSUAL RASH Items with * indicate a potential emergency and should be followed up as soon as possible.  Feel free to call the clinic should you have any questions or concerns. The clinic phone number is [phone].  Please show the [facility] at check-in to the Emergency Department and triage nurse.

## 2017-12-19 NOTE — Progress Notes (Signed)
Reviewed labs Potassium 2.7 for todays treatment.  Patient stated she is taking Potassium 72meq (4 tabs daily) and drinking lots of water.  Patient also stated no worsening of SOB, fair appetite, and denied diarrhea or constipation.   Verbal order Potassium 62meq IV and potassium 64meq by mouth today verbal order Dr. Delton Coombes.  Reviewed with pharmacy.    Reviewed patients history with Dr. Delton Coombes with verbal order to run both potassium bags over one hour for total Potassium 14meq IV.     Reviewed post vital signs after treatment with Dr. Delton Coombes and ok to discharge. See flowsheet.   Patient tolerated treatment with no complaints voiced. Denied pain.   Port site clean and dry with no bruising or swelling noted at site.  Band aid applied.  VSS with discharge and left via wheelchiar with friend with no s/s of distress noted.

## 2017-12-27 ENCOUNTER — Telehealth (HOSPITAL_COMMUNITY): Payer: Self-pay | Admitting: Emergency Medicine

## 2017-12-27 NOTE — Telephone Encounter (Signed)
Pt called and stated that she was really SOB.  She has been using her inhalers but the SOB has not improved.  Spoke with Mike Craze NP and she wants pt to come to the ER to be evaluated.  Explained to the pt that we need her to come to the ER to be evaluated to be able to tell what is causing her SOB.  Pt said she might not be able to get back up to the hospital today.  Explained that if she is having worsening SOB she really needs to come to the ER to be evaluated.  She verbalized understanding.

## 2017-12-28 ENCOUNTER — Ambulatory Visit (HOSPITAL_COMMUNITY): Payer: Medicaid Other

## 2017-12-28 ENCOUNTER — Other Ambulatory Visit (HOSPITAL_COMMUNITY): Payer: Medicaid Other

## 2017-12-28 ENCOUNTER — Ambulatory Visit (HOSPITAL_COMMUNITY): Payer: Medicaid Other | Admitting: Hematology

## 2018-01-02 ENCOUNTER — Other Ambulatory Visit (HOSPITAL_COMMUNITY): Payer: Medicaid Other

## 2018-01-02 ENCOUNTER — Ambulatory Visit (HOSPITAL_COMMUNITY): Payer: Medicaid Other | Admitting: Hematology

## 2018-01-02 ENCOUNTER — Ambulatory Visit (HOSPITAL_COMMUNITY): Payer: Medicaid Other

## 2018-01-02 ENCOUNTER — Ambulatory Visit (HOSPITAL_COMMUNITY): Payer: Medicaid Other | Admitting: Internal Medicine

## 2018-01-03 ENCOUNTER — Telehealth (HOSPITAL_COMMUNITY): Payer: Self-pay

## 2018-01-03 ENCOUNTER — Other Ambulatory Visit (HOSPITAL_COMMUNITY): Payer: Medicaid Other

## 2018-01-03 ENCOUNTER — Ambulatory Visit (HOSPITAL_COMMUNITY): Payer: Medicaid Other | Admitting: Adult Health

## 2018-01-03 ENCOUNTER — Ambulatory Visit (HOSPITAL_COMMUNITY): Payer: Medicaid Other

## 2018-01-03 NOTE — Telephone Encounter (Signed)
Message left on the patients voicemail for today's missed oncology follow up and treatment.

## 2018-01-05 ENCOUNTER — Other Ambulatory Visit (HOSPITAL_COMMUNITY): Payer: Medicaid Other

## 2018-01-05 ENCOUNTER — Telehealth (HOSPITAL_COMMUNITY): Payer: Self-pay

## 2018-01-05 ENCOUNTER — Ambulatory Visit (HOSPITAL_COMMUNITY): Payer: Medicaid Other

## 2018-01-05 ENCOUNTER — Ambulatory Visit (HOSPITAL_COMMUNITY): Payer: Medicaid Other | Admitting: Hematology

## 2018-01-05 NOTE — Progress Notes (Deleted)
[place], [place] [id number]   CLINIC:  Medical Oncology/Hematology  PCP:  [name], MD [place] [address] [phone]   REASON FOR VISIT:  Follow-up for stage III lung cancer.  CURRENT THERAPY: Durvalumab every 2 weeks consolidation.  BRIEF ONCOLOGIC HISTORY:  Oncology History   Patient presented with incidental finding on pre-op CXR for a breast biopsy.     Lung mass   12/08/2016 Imaging    CXR IMPRESSION: 5 cm masslike opacity within the right upper lobe -chest CT with contrast is recommended for further evaluation.      02/01/2017 Initial Diagnosis    Lung mass      02/11/2017 Imaging    PET IMPRESSION: 1. Hypermetabolic RIGHT upper lobe mass consists with bronchogenic carcinoma. 2. Hypermetabolic RIGHT hilar and ipsilateral RIGHT paratracheal metastatic adenopathy. 3. No contralateral nodal metastasis or supraclavicular nodal metastasis.      02/28/2017 Surgery    Operation: Flexible video fiberoptic bronchoscopy with endobronchial ultrasound and biopsies.      02/28/2017 Pathology Results    Cytology Diagnosis BRONCHIAL BRUSHING SPECIMEN D, RIGHT UPPER LOBE (SPECIMEN 4 OF 4 COLLECTED 02/28/2017) MALIGNANT CELLS CONSISTENT WITH SQUAMOUS CELL CARCINOMA.          INTERVAL HISTORY:  Sydney Blair 62 y.o. female returns for next cycle of Durvalumab.  She missed last week as she went to the ER with COPD exacerbation.  Her breathing has improved.  However she complains of easy shortness of breath on exertion for the past few months.  She is tolerating Xarelto very well.  No bleeding episodes noted.  She will switch to once a day dosing today.  She is using Spiriva, Symbicort and albuterol inhalers.  She denies any diarrhea.  She does have some itching of her skin.  Her tiredness is stable.  Numbness in the fingertips and feet has also been stable.  She is still continuing to smoke 1 pack/day of  cigarettes.  She uses 2 L/min oxygen by nasal cannula.  REVIEW OF SYSTEMS:  Review of Systems - Oncology   PAST MEDICAL/SURGICAL HISTORY:  Past Medical History:  Diagnosis Date  . Anxiety   . ARF (acute renal failure) ([place]) 08/07/2011   ?   Marland Kitchen Arthritis   . Cancer ([place]) 2005   breast-right  . Chronic back pain   . COPD (chronic obstructive pulmonary disease) ([facility])   . Depression   . Depression with anxiety   . Dyspnea    on exertion  . Fibromyalgia   . GERD (gastroesophageal reflux disease)   . History of radiation therapy 03/24/17-05/12/17   right lun 2 Gy in 30 fractions  . Hypercholesteremia   . Hypothyroidism   . Hypothyroidism 06/19/2017  . Incontinence   . Neuropathy   . Peripheral neuropathy   . Sciatic pain   . Sebaceous cyst    lt labial location  . Sleep apnea    uses CPAP  . Stage III squamous cell carcinoma of right lung ([place]) 03/10/2017  . Tobacco abuse    Past Surgical History:  Procedure Laterality Date  . bladder tack    . BREAST BIOPSY Right 12/29/2016   Procedure: RIGHT BREAST BIOPSY;  Surgeon: Aviva Signs, MD;  Location: [facility];  Service: General;  Laterality: Right;  . BREAST LUMPECTOMY     right with node dissection  . COLONOSCOPY  11/11/2010   XNA:[id number] rectal polyp and splenic flexure otherwise normal; pathology showed  tubular adenomas. Next TCS 10/2015.  Marland Kitchen ESOPHAGOGASTRODUODENOSCOPY  11/11/2010   WER:XVQMG hiatal hernia, 70 French dilator  . ESOPHAGOGASTRODUODENOSCOPY (EGD) WITH ESOPHAGEAL DILATION N/A 06/21/2013   QQP:YPPJKD esophagus  - status post Maloney dilation s/p bx (benign esophageal bx)  . PORTACATH PLACEMENT Left 07/06/2017   Procedure: INSERTION PORT-A-CATH LEFT SUBCLAVIAN;  Surgeon: Virl Cagey, MD;  Location: [facility];  Service: General;  Laterality: Left;  Marland Kitchen VIDEO BRONCHOSCOPY WITH ENDOBRONCHIAL ULTRASOUND N/A 02/28/2017   Procedure: VIDEO BRONCHOSCOPY WITH ENDOBRONCHIAL ULTRASOUND;  Surgeon: Juanito Doom, MD;   Location: [facility];  Service: Thoracic;  Laterality: N/A;     SOCIAL HISTORY:  Social History   Socioeconomic History  . Marital status: Married    Spouse name: Not on file  . Number of children: 1  . Years of education: Not on file  . Highest education level: Not on file  Occupational History  . Occupation: disabled    Fish farm manager: UNEMPLOYED  Social Needs  . Financial resource strain: Not on file  . Food insecurity:    Worry: Not on file    Inability: Not on file  . Transportation needs:    Medical: Not on file    Non-medical: Not on file  Tobacco Use  . Smoking status: Current Every Day Smoker    Packs/day: 1.00    Years: 35.00    Pack years: 35.00    Types: Cigarettes  . Smokeless tobacco: Never Used  . Tobacco comment: has cut back to one pack daily  Substance and Sexual Activity  . Alcohol use: No    Alcohol/week: 0.0 oz  . Drug use: No  . Sexual activity: Yes    Birth control/protection: Post-menopausal    Comment: chemo stopped her periods  Lifestyle  . Physical activity:    Days per week: Not on file    Minutes per session: Not on file  . Stress: Not on file  Relationships  . Social connections:    Talks on phone: Not on file    Gets together: Not on file    Attends religious service: Not on file    Active member of club or organization: Not on file    Attends meetings of clubs or organizations: Not on file    Relationship status: Not on file  . Intimate partner violence:    Fear of current or ex partner: Not on file    Emotionally abused: Not on file    Physically abused: Not on file    Forced sexual activity: Not on file  Other Topics Concern  . Not on file  Social History Narrative   Lives w. Husband   Lost 1 son-?etiology    FAMILY HISTORY:  Family History  Problem Relation Age of Onset  . Cancer Other   . Heart attack Other   . Lung disease Other   . Emphysema Mother   . Pneumonia Mother   . Cancer Father   . Autoimmune disease Neg Hx       CURRENT MEDICATIONS:  Outpatient Encounter Medications as of 01/05/2018  Medication Sig Note  . albuterol (PROVENTIL HFA;VENTOLIN HFA) 108 (90 BASE) MCG/ACT inhaler Inhale 2 puffs into the lungs every 6 (six) hours as needed for wheezing or shortness of breath.   Marland Kitchen albuterol (PROVENTIL) (2.5 MG/3ML) 0.083% nebulizer solution Take 3 mLs (2.5 mg total) by nebulization every 6 (six) hours as needed for wheezing or shortness of breath.   . budesonide-formoterol (SYMBICORT) 160-4.5 MCG/ACT inhaler Inhale 2  puffs into the lungs daily.    Marland Kitchen gabapentin (NEURONTIN) 800 MG tablet Take 800 mg by mouth 4 (four) times daily.    . hydrOXYzine (VISTARIL) 25 MG capsule Take 1 capsule (25 mg total) by mouth 3 (three) times daily as needed for itching.   . levalbuterol (XOPENEX) 0.63 MG/3ML nebulizer solution Take 3 mLs (0.63 mg total) by nebulization 3 (three) times daily.   Marland Kitchen levofloxacin (LEVAQUIN) 500 MG tablet Take 1 tablet (500 mg total) by mouth daily.   Marland Kitchen levothyroxine (SYNTHROID, LEVOTHROID) 100 MCG tablet Take 100 mcg by mouth daily before breakfast. Reported on 02/03/2016   . lidocaine-prilocaine (EMLA) cream Apply a quarter size amount to affected area 1 hour prior to coming to chemotherapy.   . metoprolol tartrate (LOPRESSOR) 50 MG tablet Take 1 tablet (50 mg total) by mouth 2 (two) times daily.   . Misc. Devices MISC Please provide patient with a nebulizer machine   . ondansetron (ZOFRAN) 8 MG tablet Take 1 tablet (8 mg total) every 8 (eight) hours as needed by mouth for nausea or vomiting.   . potassium chloride (K-DUR) 10 MEQ tablet Take 2 tablets (20 mEq total) by mouth daily.   . predniSONE (DELTASONE) 50 MG tablet One tablet PO daily for 4 days   . prochlorperazine (COMPAZINE) 10 MG tablet Take 1 tablet (10 mg total) every 6 (six) hours as needed by mouth for nausea or vomiting.   . Rivaroxaban 15 & 20 MG TBPK Take as directed on package: Start with one 15mg  tablet by mouth twice a day with  food. On Day 22, switch to one 20mg  tablet once a day with food. 11/26/2017: Started on 11/08/17  . rosuvastatin (CRESTOR) 5 MG tablet Take 1 tablet (5 mg total) by mouth daily.   . tapentadol (NUCYNTA ER) 100 MG 12 hr tablet Take 100 mg by mouth every 12 (twelve) hours.   Marland Kitchen tiotropium (SPIRIVA) 18 MCG inhalation capsule Place 1 capsule (18 mcg total) into inhaler and inhale daily.    No facility-administered encounter medications on file as of 01/05/2018.     ALLERGIES:  Allergies  Allergen Reactions  . Penicillins Anaphylaxis and Other (See Comments)    Has patient had a PCN reaction causing immediate rash, facial/tongue/throat swelling, SOB or lightheadedness with hypotension:Yes Has patient had a PCN reaction causing severe rash involving mucus membranes or skin necrosis:Yes Has patient had a PCN reaction that required hospitalization:No Has patient had a PCN reaction occurring within the last 10 years:No If all of the above answers are "NO", then may proceed with Cephalosporin use.      PHYSICAL EXAM:  ECOG Performance status: 2  There were no vitals filed for this visit. There were no vitals filed for this visit.    LABORATORY DATA:  I have reviewed the labs as listed.  CBC    Component Value Date/Time   WBC 7.2 12/19/2017 1230   RBC 4.96 12/19/2017 1230   HGB 12.7 12/19/2017 1230   HGB 8.9 (L) 06/03/2017 0804   HCT 42.4 12/19/2017 1230   HCT 27.8 (L) 06/03/2017 0804   PLT 205 12/19/2017 1230   PLT 114 (L) 06/03/2017 0804   MCV 85.5 12/19/2017 1230   MCV 99.3 06/03/2017 0804   MCH 25.6 (L) 12/19/2017 1230   MCHC 30.0 12/19/2017 1230   RDW 16.8 (H) 12/19/2017 1230   RDW 23.7 (H) 06/03/2017 0804   LYMPHSABS 0.7 12/19/2017 1230   LYMPHSABS 0.9 06/03/2017 0804   MONOABS  0.6 12/19/2017 1230   MONOABS 0.3 06/03/2017 0804   EOSABS 0.1 12/19/2017 1230   EOSABS 0.2 06/03/2017 0804   BASOSABS 0.0 12/19/2017 1230   BASOSABS 0.0 06/03/2017 0804   CMP Latest Ref Rng &  Units 12/19/2017 11/29/2017 11/25/2017  Glucose 65 - 99 mg/dL 104(H) 97 81  BUN 6 - 20 mg/dL 5(L) 8 8  Creatinine 0.44 - 1.00 mg/dL 0.75 0.71 0.85  Sodium 135 - 145 mmol/L 136 136 134(L)  Potassium 3.5 - 5.1 mmol/L 2.7(LL) 3.1(L) 4.7  Chloride 101 - 111 mmol/L 95(L) 96(L) 95(L)  CO2 22 - 32 mmol/L 29 28 26   Calcium 8.9 - 10.3 mg/dL 8.6(L) 8.3(L) 8.4(L)  Total Protein 6.5 - 8.1 g/dL 7.5 6.7 -  Total Bilirubin 0.3 - 1.2 mg/dL 1.1 0.6 -  Alkaline Phos 38 - 126 U/L 85 75 -  AST 15 - 41 U/L 16 12(L) -  ALT 14 - 54 U/L 9(L) 6(L) -       DIAGNOSTIC IMAGING:  I have reviewed her Doppler dated 11/07/2017 which showed left upper extremity deep vein thrombosis.     ASSESSMENT & PLAN:   No problem-specific Assessment & Plan notes found for this encounter.  5.COPD exacerbation: We will try to get her nebulizer with albuterol at home.    Orders placed this encounter:  No orders of the defined types were placed in this encounter.   This note includes documentation from Mike Craze, NP, who was present during this patient's office visit and evaluation.  I have reviewed this note for its completeness and accuracy.  I have edited this note accordingly based on my findings and medical opinion.      Derek Jack, MD [place] [phone]

## 2018-01-13 ENCOUNTER — Inpatient Hospital Stay (HOSPITAL_COMMUNITY)
Admission: EM | Admit: 2018-01-13 | Discharge: 2018-01-18 | DRG: 189 | Disposition: A | Discharge status: Home / Self Care | Payer: Medicaid Other | Attending: Family Medicine | Admitting: Family Medicine

## 2018-01-13 ENCOUNTER — Encounter (HOSPITAL_COMMUNITY): Payer: Self-pay | Admitting: Emergency Medicine

## 2018-01-13 ENCOUNTER — Emergency Department (HOSPITAL_COMMUNITY): Payer: Medicaid Other

## 2018-01-13 ENCOUNTER — Other Ambulatory Visit: Payer: Self-pay

## 2018-01-13 DIAGNOSIS — Z7951 Long term (current) use of inhaled steroids: Secondary | ICD-10-CM

## 2018-01-13 DIAGNOSIS — J910 Malignant pleural effusion: Secondary | ICD-10-CM | POA: Diagnosis present

## 2018-01-13 DIAGNOSIS — J189 Pneumonia, unspecified organism: Secondary | ICD-10-CM | POA: Diagnosis not present

## 2018-01-13 DIAGNOSIS — E7800 Pure hypercholesterolemia, unspecified: Secondary | ICD-10-CM | POA: Diagnosis present

## 2018-01-13 DIAGNOSIS — G4730 Sleep apnea, unspecified: Secondary | ICD-10-CM | POA: Diagnosis present

## 2018-01-13 DIAGNOSIS — Z515 Encounter for palliative care: Secondary | ICD-10-CM | POA: Diagnosis not present

## 2018-01-13 DIAGNOSIS — J9622 Acute and chronic respiratory failure with hypercapnia: Secondary | ICD-10-CM | POA: Diagnosis not present

## 2018-01-13 DIAGNOSIS — Z9889 Other specified postprocedural states: Secondary | ICD-10-CM

## 2018-01-13 DIAGNOSIS — Z9981 Dependence on supplemental oxygen: Secondary | ICD-10-CM

## 2018-01-13 DIAGNOSIS — J441 Chronic obstructive pulmonary disease with (acute) exacerbation: Secondary | ICD-10-CM | POA: Diagnosis present

## 2018-01-13 DIAGNOSIS — R0602 Shortness of breath: Secondary | ICD-10-CM | POA: Diagnosis present

## 2018-01-13 DIAGNOSIS — Z79899 Other long term (current) drug therapy: Secondary | ICD-10-CM | POA: Diagnosis not present

## 2018-01-13 DIAGNOSIS — G9341 Metabolic encephalopathy: Secondary | ICD-10-CM | POA: Diagnosis present

## 2018-01-13 DIAGNOSIS — Z7189 Other specified counseling: Secondary | ICD-10-CM | POA: Diagnosis not present

## 2018-01-13 DIAGNOSIS — Z86718 Personal history of other venous thrombosis and embolism: Secondary | ICD-10-CM | POA: Diagnosis not present

## 2018-01-13 DIAGNOSIS — E876 Hypokalemia: Secondary | ICD-10-CM | POA: Diagnosis present

## 2018-01-13 DIAGNOSIS — F17210 Nicotine dependence, cigarettes, uncomplicated: Secondary | ICD-10-CM | POA: Diagnosis present

## 2018-01-13 DIAGNOSIS — J90 Pleural effusion, not elsewhere classified: Secondary | ICD-10-CM | POA: Diagnosis not present

## 2018-01-13 DIAGNOSIS — Z9221 Personal history of antineoplastic chemotherapy: Secondary | ICD-10-CM

## 2018-01-13 DIAGNOSIS — C3491 Malignant neoplasm of unspecified part of right bronchus or lung: Secondary | ICD-10-CM | POA: Diagnosis present

## 2018-01-13 DIAGNOSIS — Z880 Allergy status to penicillin: Secondary | ICD-10-CM

## 2018-01-13 DIAGNOSIS — J440 Chronic obstructive pulmonary disease with acute lower respiratory infection: Secondary | ICD-10-CM | POA: Diagnosis present

## 2018-01-13 DIAGNOSIS — Z66 Do not resuscitate: Secondary | ICD-10-CM | POA: Diagnosis present

## 2018-01-13 DIAGNOSIS — E039 Hypothyroidism, unspecified: Secondary | ICD-10-CM | POA: Diagnosis not present

## 2018-01-13 DIAGNOSIS — J9621 Acute and chronic respiratory failure with hypoxia: Principal | ICD-10-CM | POA: Diagnosis present

## 2018-01-13 DIAGNOSIS — Z79890 Hormone replacement therapy (postmenopausal): Secondary | ICD-10-CM

## 2018-01-13 DIAGNOSIS — R0600 Dyspnea, unspecified: Secondary | ICD-10-CM

## 2018-01-13 DIAGNOSIS — J9690 Respiratory failure, unspecified, unspecified whether with hypoxia or hypercapnia: Secondary | ICD-10-CM

## 2018-01-13 LAB — COMPREHENSIVE METABOLIC PANEL
ALK PHOS: 98 U/L (ref 38–126)
ALT: 546 U/L — AB (ref 14–54)
AST: 856 U/L — ABNORMAL HIGH (ref 15–41)
Albumin: 3.1 g/dL — ABNORMAL LOW (ref 3.5–5.0)
Anion gap: 9 (ref 5–15)
BUN: 24 mg/dL — ABNORMAL HIGH (ref 6–20)
CALCIUM: 8.1 mg/dL — AB (ref 8.9–10.3)
CO2: 28 mmol/L (ref 22–32)
CREATININE: 0.97 mg/dL (ref 0.44–1.00)
Chloride: 94 mmol/L — ABNORMAL LOW (ref 101–111)
Glucose, Bld: 98 mg/dL (ref 65–99)
Potassium: 3 mmol/L — ABNORMAL LOW (ref 3.5–5.1)
Sodium: 131 mmol/L — ABNORMAL LOW (ref 135–145)
TOTAL PROTEIN: 6.6 g/dL (ref 6.5–8.1)
Total Bilirubin: 1.7 mg/dL — ABNORMAL HIGH (ref 0.3–1.2)

## 2018-01-13 LAB — URINALYSIS, ROUTINE W REFLEX MICROSCOPIC
Bilirubin Urine: NEGATIVE
Glucose, UA: NEGATIVE mg/dL
HGB URINE DIPSTICK: NEGATIVE
Ketones, ur: NEGATIVE mg/dL
LEUKOCYTES UA: NEGATIVE
NITRITE: NEGATIVE
PROTEIN: NEGATIVE mg/dL
Specific Gravity, Urine: 1.012 (ref 1.005–1.030)
pH: 6 (ref 5.0–8.0)

## 2018-01-13 LAB — CBC WITH DIFFERENTIAL/PLATELET
Basophils Absolute: 0 10*3/uL (ref 0.0–0.1)
Basophils Relative: 0 %
EOS ABS: 0 10*3/uL (ref 0.0–0.7)
EOS PCT: 0 %
HCT: 42.2 % (ref 36.0–46.0)
HEMOGLOBIN: 12.9 g/dL (ref 12.0–15.0)
Lymphocytes Relative: 3 %
Lymphs Abs: 0.4 10*3/uL — ABNORMAL LOW (ref 0.7–4.0)
MCH: 24.7 pg — AB (ref 26.0–34.0)
MCHC: 30.6 g/dL (ref 30.0–36.0)
MCV: 80.7 fL (ref 78.0–100.0)
MONO ABS: 1.4 10*3/uL — AB (ref 0.1–1.0)
Monocytes Relative: 11 %
Neutro Abs: 10.5 10*3/uL — ABNORMAL HIGH (ref 1.7–7.7)
Neutrophils Relative %: 86 %
PLATELETS: 196 10*3/uL (ref 150–400)
RBC: 5.23 MIL/uL — AB (ref 3.87–5.11)
RDW: 18.3 % — ABNORMAL HIGH (ref 11.5–15.5)
WBC: 12.3 10*3/uL — ABNORMAL HIGH (ref 4.0–10.5)

## 2018-01-13 LAB — I-STAT CG4 LACTIC ACID, ED
LACTIC ACID, VENOUS: 1.04 mmol/L (ref 0.5–1.9)
LACTIC ACID, VENOUS: 1.79 mmol/L (ref 0.5–1.9)

## 2018-01-13 LAB — PROCALCITONIN: PROCALCITONIN: 0.56 ng/mL

## 2018-01-13 MED ORDER — TAPENTADOL HCL ER 100 MG PO TB12: 100.0000 mg | ORAL_TABLET | Freq: Two times a day (BID) | ORAL | Status: DC

## 2018-01-13 MED ORDER — LEVOFLOXACIN IN D5W 750 MG/150ML IV SOLN
750.0000 mg | INTRAVENOUS | Status: DC
  Administered 2018-01-14 – 2018-01-17 (×4): 750 mg via INTRAVENOUS
  Filled 2018-01-13 (×3): qty 150

## 2018-01-13 MED ORDER — MOMETASONE FURO-FORMOTEROL FUM 200-5 MCG/ACT IN AERO
2.0000 | INHALATION_SPRAY | Freq: Two times a day (BID) | RESPIRATORY_TRACT | Status: DC
  Administered 2018-01-14 – 2018-01-18 (×7): 2 via RESPIRATORY_TRACT
  Filled 2018-01-13 (×2): qty 8.8

## 2018-01-13 MED ORDER — ALBUTEROL SULFATE (2.5 MG/3ML) 0.083% IN NEBU
2.5000 mg | INHALATION_SOLUTION | Freq: Four times a day (QID) | RESPIRATORY_TRACT | Status: DC
  Administered 2018-01-13: 2.5 mg via RESPIRATORY_TRACT

## 2018-01-13 MED ORDER — POTASSIUM CHLORIDE CRYS ER 20 MEQ PO TBCR
20.0000 meq | EXTENDED_RELEASE_TABLET | Freq: Every day | ORAL | Status: DC
  Administered 2018-01-14 – 2018-01-16 (×3): 20 meq via ORAL
  Filled 2018-01-13 (×3): qty 1

## 2018-01-13 MED ORDER — LEVOTHYROXINE SODIUM 100 MCG PO TABS
100.0000 ug | ORAL_TABLET | Freq: Every day | ORAL | Status: DC
  Administered 2018-01-14 – 2018-01-18 (×5): 100 ug via ORAL
  Filled 2018-01-13 (×5): qty 1

## 2018-01-13 MED ORDER — PREDNISONE 20 MG PO TABS
40.0000 mg | ORAL_TABLET | Freq: Every day | ORAL | Status: DC
  Administered 2018-01-15 – 2018-01-17 (×3): 40 mg via ORAL
  Filled 2018-01-13 (×3): qty 2

## 2018-01-13 MED ORDER — POTASSIUM CHLORIDE IN NACL 40-0.9 MEQ/L-% IV SOLN
INTRAVENOUS | Status: DC
  Administered 2018-01-13 – 2018-01-14 (×2): 100 mL/h via INTRAVENOUS
  Administered 2018-01-15: 60 mL/h via INTRAVENOUS
  Administered 2018-01-15: 100 mL/h via INTRAVENOUS
  Administered 2018-01-16: 60 mL/h via INTRAVENOUS
  Filled 2018-01-13 (×7): qty 1000

## 2018-01-13 MED ORDER — ONDANSETRON HCL 4 MG/2ML IJ SOLN: 4.0000 mg | Freq: Four times a day (QID) | INTRAMUSCULAR | Status: DC | PRN

## 2018-01-13 MED ORDER — ACETAMINOPHEN 325 MG PO TABS: 650.0000 mg | ORAL_TABLET | Freq: Four times a day (QID) | ORAL | Status: DC | PRN

## 2018-01-13 MED ORDER — GUAIFENESIN ER 600 MG PO TB12
600.0000 mg | ORAL_TABLET | Freq: Two times a day (BID) | ORAL | Status: DC
  Administered 2018-01-13 – 2018-01-18 (×9): 600 mg via ORAL
  Filled 2018-01-13 (×9): qty 1

## 2018-01-13 MED ORDER — ROSUVASTATIN CALCIUM 5 MG PO TABS
5.0000 mg | ORAL_TABLET | Freq: Every day | ORAL | Status: DC
  Administered 2018-01-13 – 2018-01-17 (×5): 5 mg via ORAL
  Filled 2018-01-13 (×6): qty 1

## 2018-01-13 MED ORDER — NICOTINE 14 MG/24HR TD PT24
14.0000 mg | MEDICATED_PATCH | Freq: Every day | TRANSDERMAL | Status: DC
  Administered 2018-01-14 – 2018-01-18 (×5): 14 mg via TRANSDERMAL
  Filled 2018-01-13 (×6): qty 1

## 2018-01-13 MED ORDER — ALBUTEROL SULFATE (2.5 MG/3ML) 0.083% IN NEBU
2.5000 mg | INHALATION_SOLUTION | RESPIRATORY_TRACT | Status: DC | PRN
  Filled 2018-01-13: qty 3

## 2018-01-13 MED ORDER — GABAPENTIN 400 MG PO CAPS
800.0000 mg | ORAL_CAPSULE | Freq: Four times a day (QID) | ORAL | Status: DC
  Administered 2018-01-13 – 2018-01-18 (×16): 800 mg via ORAL
  Filled 2018-01-13 (×16): qty 2

## 2018-01-13 MED ORDER — LEVOFLOXACIN IN D5W 750 MG/150ML IV SOLN: 750.0000 mg | Freq: Once | INTRAVENOUS | Status: DC

## 2018-01-13 MED ORDER — ACETAMINOPHEN 650 MG RE SUPP: 650.0000 mg | Freq: Four times a day (QID) | RECTAL | Status: DC | PRN

## 2018-01-13 MED ORDER — ALBUTEROL SULFATE (2.5 MG/3ML) 0.083% IN NEBU: 5.0000 mg | INHALATION_SOLUTION | Freq: Once | RESPIRATORY_TRACT | Status: DC

## 2018-01-13 MED ORDER — METOPROLOL TARTRATE 50 MG PO TABS
50.0000 mg | ORAL_TABLET | Freq: Two times a day (BID) | ORAL | Status: DC
  Administered 2018-01-13 – 2018-01-14 (×2): 50 mg via ORAL
  Filled 2018-01-13 (×2): qty 1

## 2018-01-13 MED ORDER — LEVALBUTEROL HCL 0.63 MG/3ML IN NEBU
0.6300 mg | INHALATION_SOLUTION | Freq: Four times a day (QID) | RESPIRATORY_TRACT | Status: DC
  Administered 2018-01-14 – 2018-01-18 (×17): 0.63 mg via RESPIRATORY_TRACT
  Filled 2018-01-13 (×17): qty 3

## 2018-01-13 MED ORDER — HYDROXYZINE HCL 25 MG PO TABS
25.0000 mg | ORAL_TABLET | Freq: Three times a day (TID) | ORAL | Status: DC | PRN
  Administered 2018-01-17: 25 mg via ORAL
  Filled 2018-01-13: qty 1

## 2018-01-13 MED ORDER — PROCHLORPERAZINE MALEATE 5 MG PO TABS: 10.0000 mg | ORAL_TABLET | Freq: Four times a day (QID) | ORAL | Status: DC | PRN

## 2018-01-13 MED ORDER — IOPAMIDOL (ISOVUE-370) INJECTION 76%
100.0000 mL | Freq: Once | INTRAVENOUS | Status: AC | PRN
  Administered 2018-01-13: 100 mL via INTRAVENOUS

## 2018-01-13 MED ORDER — TIOTROPIUM BROMIDE MONOHYDRATE 18 MCG IN CAPS
18.0000 ug | ORAL_CAPSULE | Freq: Every day | RESPIRATORY_TRACT | Status: DC
  Administered 2018-01-14 – 2018-01-18 (×5): 18 ug via RESPIRATORY_TRACT
  Filled 2018-01-13 (×2): qty 5

## 2018-01-13 MED ORDER — RIVAROXABAN 20 MG PO TABS
20.0000 mg | ORAL_TABLET | Freq: Every day | ORAL | Status: DC
  Administered 2018-01-14 – 2018-01-17 (×4): 20 mg via ORAL
  Filled 2018-01-13 (×4): qty 1

## 2018-01-13 MED ORDER — METHYLPREDNISOLONE SODIUM SUCC 125 MG IJ SOLR
60.0000 mg | Freq: Four times a day (QID) | INTRAMUSCULAR | Status: AC
  Administered 2018-01-13 – 2018-01-14 (×4): 60 mg via INTRAVENOUS
  Filled 2018-01-13 (×4): qty 2

## 2018-01-13 MED ORDER — IPRATROPIUM-ALBUTEROL 0.5-2.5 (3) MG/3ML IN SOLN
3.0000 mL | RESPIRATORY_TRACT | Status: AC
  Administered 2018-01-13 (×3): 3 mL via RESPIRATORY_TRACT
  Filled 2018-01-13 (×2): qty 6

## 2018-01-13 MED ORDER — ONDANSETRON HCL 4 MG PO TABS: 4.0000 mg | ORAL_TABLET | Freq: Four times a day (QID) | ORAL | Status: DC | PRN

## 2018-01-13 MED ORDER — LACTATED RINGERS IV BOLUS
1000.0000 mL | Freq: Once | INTRAVENOUS | Status: AC
  Administered 2018-01-13: 1000 mL via INTRAVENOUS

## 2018-01-13 NOTE — ED Notes (Signed)
Pt aware of CT pending and the backup for CT

## 2018-01-13 NOTE — ED Notes (Signed)
Call for report Bed assigned not released

## 2018-01-13 NOTE — ED Notes (Signed)
Have accessed pt.'s [facility]

## 2018-01-13 NOTE — ED Notes (Signed)
Report to Lavella Lemons, [place]

## 2018-01-13 NOTE — ED Triage Notes (Signed)
Patient on chemo for lung cancer. C/O SOB that started a couple of months ago, worsening over the past few days.

## 2018-01-13 NOTE — ED Notes (Signed)
To CT

## 2018-01-13 NOTE — ED Provider Notes (Signed)
[facility] EMERGENCY DEPARTMENT Provider Note   CSN: [id number] Arrival date & time: 01/13/18  1521     History   Chief Complaint Chief Complaint  Patient presents with  . Shortness of Breath    HPI Sydney Blair is a 62 y.o. female.   Shortness of Breath  This is a chronic problem. The average episode lasts 2 months. The problem occurs continuously.The problem has been gradually worsening. Associated symptoms include cough, sputum production and leg swelling. Pertinent negatives include no fever and no chest pain.    Past Medical History:  Diagnosis Date  . Anxiety   . ARF (acute renal failure) ([place]) 08/07/2011   ?   Marland Kitchen Arthritis   . Cancer ([place]) 2005   breast-right  . Chronic back pain   . COPD (chronic obstructive pulmonary disease) ([place])   . Depression   . Depression with anxiety   . Dyspnea    on exertion  . Fibromyalgia   . GERD (gastroesophageal reflux disease)   . History of radiation therapy 03/24/17-05/12/17   right lun 2 Gy in 30 fractions  . Hypercholesteremia   . Hypothyroidism   . Hypothyroidism 06/19/2017  . Incontinence   . Neuropathy   . Peripheral neuropathy   . Sciatic pain   . Sebaceous cyst    lt labial location  . Sleep apnea    uses CPAP  . 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 Tobacco abuse     Patient Active Problem List   Diagnosis Date Noted  . Shortness of breath 01/13/2018  . Pleural effusion 01/13/2018  . Malignant neoplasm of right lung ([place])   . Hypothyroidism 06/19/2017  . Hypomagnesemia 06/19/2017  . Encounter for antineoplastic immunotherapy 06/06/2017  . Chronic fatigue 06/06/2017  . Community acquired pneumonia 05/26/2017  . Pressure injury of skin 05/18/2017  . Hepatitis 05/16/2017  . Nausea vomiting and diarrhea 05/16/2017  . SIRS (systemic inflammatory response syndrome) ([place]) 05/16/2017  . COPD (chronic obstructive pulmonary disease) ([place]) 05/16/2017  . Abnormal CXR  05/16/2017  . Anemia 05/16/2017  . Thrombocytopenia ([place]) 05/16/2017  . Depression 05/16/2017  . Anxiety 05/16/2017  . Chronic pain 05/16/2017  . Chronic respiratory failure with hypoxia ([place]) 05/16/2017  . 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 Goals of care, counseling/discussion 03/10/2017  . Encounter for antineoplastic chemotherapy 03/10/2017  . Lung mass 02/01/2017  . Breast mass, right   . Hx of adenomatous colonic polyps 12/28/2016  . Fracture of fifth metatarsal bone of right foot with delayed healing 02/03/2016  . IBS (irritable bowel syndrome) 07/30/2015  . Diarrhea 10/29/2014  . Hoarseness 10/29/2014  . Chronic hoarseness 01/16/2014  . Other dysphagia 01/16/2014  . Dysphagia, unspecified(787.20) 06/04/2013  . Unspecified constipation 06/04/2013  . Unsteady gait 01/25/2013  . 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 DOE (dyspnea on exertion) 12/01/2012  . 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 Dysphagia 11/09/2012  . COPD exacerbation ([place]) 08/07/2011  . Hypokalemia 08/07/2011  . Myositis 08/07/2011  . Dehydration 08/07/2011  . ARF (acute renal failure) ([place]) 08/07/2011  . Tubular adenoma 07/20/2011  . INSOMNIA 02/26/2008  . CERUMEN IMPACTION, RIGHT 01/23/2008  . 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 NECK PAIN 11/22/2007  . HAMMER TOE 11/22/2007  . ANXIETY 01/30/2007  . TOBACCO ABUSE 01/30/2007  . DEPRESSION 01/30/2007  . GERD 01/30/2007  . PROLAPSE, VAGINAL WALL, CYSTOCELE, MIDLINE 01/10/2007  .  INCONTINENCE 01/10/2007  . SEBACEOUS CYST 08/17/2006  . Breast cancer, right breast ([place]) 12/01/2003    Past Surgical History:  Procedure Laterality Date  . bladder tack    . BREAST BIOPSY Right 12/29/2016   Procedure: RIGHT BREAST BIOPSY;  Surgeon: Aviva Signs, MD;  Location: [facility];  Service: General;  Laterality: Right;  . BREAST LUMPECTOMY       right with node dissection  . COLONOSCOPY  11/11/2010   QVZ:DGLOVFIEPP rectal polyp and splenic flexure otherwise normal; pathology showed  tubular adenomas. Next TCS 10/2015.  Marland Kitchen ESOPHAGOGASTRODUODENOSCOPY  11/11/2010   IRJ:JOACZ hiatal hernia, 74 French dilator  . ESOPHAGOGASTRODUODENOSCOPY (EGD) WITH ESOPHAGEAL DILATION N/A 06/21/2013   YSA:YTKZSW esophagus  - status post Maloney dilation s/p bx (benign esophageal bx)  . PORTACATH PLACEMENT Left 07/06/2017   Procedure: INSERTION PORT-A-CATH LEFT SUBCLAVIAN;  Surgeon: Virl Cagey, MD;  Location: [facility];  Service: General;  Laterality: Left;  Marland Kitchen VIDEO BRONCHOSCOPY WITH ENDOBRONCHIAL ULTRASOUND N/A 02/28/2017   Procedure: VIDEO BRONCHOSCOPY WITH ENDOBRONCHIAL ULTRASOUND;  Surgeon: Juanito Doom, MD;  Location: [facility] OR;  Service: Thoracic;  Laterality: N/A;     OB History   None      Home Medications    Prior to Admission medications   Medication Sig Start Date End Date Taking? Authorizing Provider  albuterol (PROVENTIL HFA;VENTOLIN HFA) 108 (90 BASE) MCG/ACT inhaler Inhale 2 puffs into the lungs every 6 (six) hours as needed for wheezing or shortness of breath.    [provider]  albuterol (PROVENTIL) (2.5 MG/3ML) 0.083% nebulizer solution Take 3 mLs (2.5 mg total) by nebulization every 6 (six) hours as needed for wheezing or shortness of breath. 11/29/17   Derek Jack, MD  budesonide-formoterol [facility]) 160-4.5 MCG/ACT inhaler Inhale 2 puffs into the lungs daily.     [provider]  gabapentin (NEURONTIN) 800 MG tablet Take 800 mg by mouth 4 (four) times daily.     [provider]  hydrOXYzine (VISTARIL) 25 MG capsule Take 1 capsule (25 mg total) by mouth 3 (three) times daily as needed for itching. 08/25/17   Jacquelin Hawking, NP  levalbuterol (XOPENEX) 0.63 MG/3ML nebulizer solution Take 3 mLs (0.63 mg total) by nebulization 3 (three) times daily. 05/21/17   Regalado, Belkys A, MD   levofloxacin (LEVAQUIN) 500 MG tablet Take 1 tablet (500 mg total) by mouth daily. 10/26/17   Higgs, Mathis Dad, MD  levothyroxine (SYNTHROID, LEVOTHROID) 100 MCG tablet Take 100 mcg by mouth daily before breakfast. Reported on 02/03/2016    [provider]  lidocaine-prilocaine (EMLA) cream Apply a quarter size amount to affected area 1 hour prior to coming to chemotherapy. 08/09/17   Twana First, MD  metoprolol tartrate (LOPRESSOR) 50 MG tablet Take 1 tablet (50 mg total) by mouth 2 (two) times daily. 10/26/17   Herminio Commons, MD  Misc. Devices MISC Please provide patient with a nebulizer machine 11/29/17   Derek Jack, MD  ondansetron (ZOFRAN) 8 MG tablet Take 1 tablet (8 mg total) every 8 (eight) hours as needed by mouth for nausea or vomiting. 06/28/17   Twana First, MD  potassium chloride (K-DUR) 10 MEQ tablet Take 2 tablets (20 mEq total) by mouth daily. 11/29/17   Derek Jack, MD  predniSONE (DELTASONE) 50 MG tablet One tablet PO daily for 4 days 11/26/17   Ripley Fraise, MD  prochlorperazine (COMPAZINE) 10 MG tablet Take 1 tablet (10 mg total) every 6 (six) hours as needed by mouth for nausea  or vomiting. 06/28/17   Twana First, MD  Rivaroxaban 15 & 20 MG TBPK Take as directed on package: Start with one 15mg  tablet by mouth twice a day with food. On Day 22, switch to one 20mg  tablet once a day with food. 11/07/17   Virgel Manifold, MD  rosuvastatin (CRESTOR) 5 MG tablet Take 1 tablet (5 mg total) by mouth daily. 10/26/17 01/24/18  Herminio Commons, MD  tapentadol (NUCYNTA ER) 100 MG 12 hr tablet Take 100 mg by mouth every 12 (twelve) hours.    [provider]  tiotropium (SPIRIVA) 18 MCG inhalation capsule Place 1 capsule (18 mcg total) into inhaler and inhale daily. 01/31/15   Clance, Armando Reichert, MD    Family History Family History  Problem Relation Age of Onset  . Cancer Other   . Heart attack Other   . Lung disease Other   . Emphysema Mother   . Pneumonia  Mother   . Cancer Father   . Autoimmune disease Neg Hx     Social History Social History   Tobacco Use  . Smoking status: Current Every Day Smoker    Packs/day: 1.00    Years: 35.00    Pack years: 35.00    Types: Cigarettes  . Smokeless tobacco: Never Used  . Tobacco comment: has cut back to one pack daily  Substance Use Topics  . Alcohol use: No    Alcohol/week: 0.0 oz  . Drug use: No     Allergies   Penicillins   Review of Systems Review of Systems  Constitutional: Negative for fever.  Respiratory: Positive for cough, sputum production and shortness of breath.   Cardiovascular: Positive for leg swelling. Negative for chest pain.  All other systems reviewed and are negative.    Physical Exam Updated Vital Signs BP (!) 128/109   Pulse (!) 120   Temp (!) 97.5 F (36.4 C) (Oral)   Resp (!) 23   Ht 5\' 1"  (1.549 m)   Wt 65.8 kg (145 lb)   SpO2 92%   BMI 27.40 kg/m   Physical Exam  Constitutional: She is oriented to person, place, and time. She appears well-developed and well-nourished.  HENT:  Head: Normocephalic and atraumatic.  Eyes: Conjunctivae and EOM are normal.  Neck: Normal range of motion.  Cardiovascular: Normal rate and regular rhythm.  Pulmonary/Chest: Accessory muscle usage present. No stridor. Tachypnea noted. No respiratory distress. She has decreased breath sounds. She has wheezes.  Abdominal: She exhibits no distension.  Musculoskeletal: Normal range of motion. She exhibits no edema or deformity.  Neurological: She is alert and oriented to person, place, and time.  Skin: Skin is warm and dry.  Nursing note and vitals reviewed.    ED Treatments / Results  Labs (all labs ordered are listed, but only abnormal results are displayed) Labs Reviewed  CBC WITH DIFFERENTIAL/PLATELET - Abnormal; Notable for the following components:      Result Value   WBC 12.3 (*)    RBC 5.23 (*)    MCH 24.7 (*)    RDW 18.3 (*)    Neutro Abs 10.5 (*)     Lymphs Abs 0.4 (*)    Monocytes Absolute 1.4 (*)    All other components within normal limits  COMPREHENSIVE METABOLIC PANEL - Abnormal; Notable for the following components:   Sodium 131 (*)    Potassium 3.0 (*)    Chloride 94 (*)    BUN 24 (*)    Calcium 8.1 (*)  Albumin 3.1 (*)    AST 856 (*)    ALT 546 (*)    Total Bilirubin 1.7 (*)    All other components within normal limits  CULTURE, BLOOD (ROUTINE X 2)  CULTURE, BLOOD (ROUTINE X 2)  CULTURE, EXPECTORATED SPUTUM-ASSESSMENT  URINALYSIS, ROUTINE W REFLEX MICROSCOPIC  PROCALCITONIN  STREP PNEUMONIAE URINARY ANTIGEN  I-STAT CG4 LACTIC ACID, ED  I-STAT CG4 LACTIC ACID, ED    EKG EKG Interpretation  Date/Time:  Friday Jan 13 2018 16:28:34 EDT Ventricular Rate:  119 PR Interval:    QRS Duration: 83 QT Interval:  411 QTC Calculation: 579 R Axis:   101 Text Interpretation:  Atrial fibrillation Right axis deviation Borderline T wave abnormalities Minimal ST elevation, lateral leads Prolonged QT interval Confirmed by Merrily Pew [phone]) on 01/13/2018 4:44:22 PM   Radiology Dg Chest 2 View  Result Date: 01/13/2018 CLINICAL DATA:  Shortness of breath. History of right lung carcinoma. History of breast carcinoma EXAM: CHEST - 2 VIEW COMPARISON:  PET-CT November 02, 2017 and chest radiograph November 25, 2017 FINDINGS: There is extensive scarring with volume loss in the right upper lobe. There is a stable area of apparent cavitation in the right upper lobe region. There is elevation of the right hemidiaphragm with blunting of the right costophrenic angle, likely due to scarring. Left lung is clear. Heart size is normal. There is distortion of pulmonary vascularity on the right. Pulmonary vascularity on the left is normal. No adenopathy is demonstrable by radiography. Port-A-Cath tip is in the superior vena cava. No pneumothorax. No evident bone lesions. IMPRESSION: Extensive scarring with volume loss right upper lobe. Milder scarring  noted right base. Chronic blunting of the right costophrenic angle noted. Stable area of cavitation right upper lobe posteriorly. These changes may be due to previous radiation therapy. No progression of cavitation in the right upper lobe. No new opacity. Left lung is clear. Stable cardiac silhouette. Port-A-Cath tip in superior vena cava. No evident pneumothorax. Electronically Signed   By: Lowella Grip III M.D.   On: 01/13/2018 15:51   Ct Angio Chest Pe W And/or Wo Contrast  Result Date: 01/13/2018 CLINICAL DATA:  Lung cancer, on chemotherapy. Worsening shortness of breath. EXAM: CT ANGIOGRAPHY CHEST WITH CONTRAST TECHNIQUE: Multidetector CT imaging of the chest was performed using the standard protocol during bolus administration of intravenous contrast. Multiplanar CT image reconstructions and MIPs were obtained to evaluate the vascular anatomy. CONTRAST:  155mL ISOVUE-370 IOPAMIDOL (ISOVUE-370) INJECTION 76% COMPARISON:  PET-CT from 11/02/2017. FINDINGS: Cardiovascular: Mildly reduced sensitivity in assessing for pulmonary embolus due to motion artifact. No filling defect is identified in the pulmonary arterial tree to suggest pulmonary embolus. Reduction in size of elements of the right pulmonary arterial tree likely attributable to hypoventilation related vaso constriction. Aortoiliac atherosclerotic vascular disease. Mild right heart enlargement. Mediastinum/Nodes: Progressive infiltration in density in the mediastinal adipose tissues could be from infiltrative tumor, mediastinal edema, or mediastinitis. The mild adenopathy shown on the prior exam is still present but is indistinctly marginated. Distended right jugular vein. No pericardial effusion. Lungs/Pleura: Progressive loculated pleural effusion on the right side; malignant effusion or empyema not excluded. Cavitary lesion in the right mid lung is probably in the upper lobe and contains an air-fluid level, measuring about 4.6 cm transverse,  roughly similar to the prior exam. This could be a cavitary mass or cavitary pneumonia, the persistence favors the former. Increased right perihilar airspace opacity. Right paramediastinal airspace opacity and/or loculated fluid is increased. Paraseptal  and centrilobular emphysema. Upper Abdomen: Extensive new ascites. Chronic left hydronephrosis. Aortoiliac atherosclerotic vascular disease. Musculoskeletal: Old right rib fractures, some nonunited, stable. Thoracic spondylosis. Review of the MIP images confirms the above findings. IMPRESSION: 1. No acute pulmonary embolus identified. 2. Notable and increased right perihilar and right paramediastinal airspace opacity, query tumor, pneumonia, or radiation pneumonitis. 3. Current moderate to large loculated right pleural effusion is new compared to 11/02/2017. Malignant effusion or empyema not excluded. There is also extensive new ascites and fluid infiltration of the mediastinum obscuring fatty tissues, which may be from third spacing of fluid, mediastinitis, or infiltrative tumor. No pericardial effusion. 4. Mild right heart enlargement. 5. Cavitary mass or cavitary pneumonia in the right upper lobe, essentially stable from June (which favors a mass). 6. Aortic Atherosclerosis (ICD10-I70.0) and Emphysema (ICD10-J43.9). 7. Chronic left hydronephrosis. Electronically Signed   By: Van Clines M.D.   On: 01/13/2018 19:20    Procedures Procedures (including critical care time)  Medications Ordered in ED Medications  levofloxacin (LEVAQUIN) IVPB 750 mg (750 mg Intravenous Not Given 01/13/18 2121)  nicotine (NICODERM CQ - dosed in mg/24 hours) patch 14 mg (has no administration in time range)  lactated ringers bolus 1,000 mL (0 mLs Intravenous Stopped 01/13/18 2104)  ipratropium-albuterol (DUONEB) 0.5-2.5 (3) MG/3ML nebulizer solution 3 mL (3 mLs Nebulization Given 01/13/18 1621)  iopamidol (ISOVUE-370) 76 % injection 100 mL (100 mLs Intravenous Contrast  Given 01/13/18 1902)     Initial Impression / Assessment and Plan / ED Course  I have reviewed the triage vital signs and the nursing notes.  Pertinent labs & imaging results that were available during my care of the patient were reviewed by me and considered in my medical decision making (see chart for details).     Patient with likely worsening disease in her right lung.  Also could be a pneumonia with empyema versus loculated pleural effusion.  After discussion with hospice we will treat with antibiotics.  Will admit for thoracentesis for further work-up and management.  Final Clinical Impressions(s) / ED Diagnoses   Final diagnoses:  Dyspnea, unspecified type  Pleural effusion     Jerremy Maione, Corene Cornea, MD 01/13/18 2205

## 2018-01-13 NOTE — ED Notes (Signed)
Pt with Stg 3 Lung Ca  Presents with increased SOB, jaundiced  Has called and spoken w Onc several times this week

## 2018-01-13 NOTE — ED Notes (Signed)
Pt reports she continues to smoke

## 2018-01-13 NOTE — H&P (Signed)
History and Physical  Sydney Blair MHD:[id number] DOB: Oct 21, 1955 DOA: 01/13/2018  Referring physician: Dr Dayna Barker, ED physician PCP: [name], MD  Outpatient Specialists:   Patient Coming From: home  Chief Complaint: Shortness of breath  HPI: Sydney Blair is a 62 y.o. female with a history of stage III squamous cell carcinoma of right lung status post chemo radiation from 03/21/2017 through 05/02/2017, left upper extremity DVT on Xarelto, COPD, depression, GERD, hypothyroidism, chronic respiratory failure.  Patient having worsening shortness of breath over the past 2 months, but worse most recently.  Dyspnea worse with exertion to 10 feet and improved with rest and nebulizer treatments.  Does have chronic cough (patient still smokes 1 pack/day), but feels that her cough is worse than normal.  No other palliating or provoking factors.  CT chest shows possible  Emergency Department Course: Right-sided pneumonia versus worsening cancer.  Also shows new pleural effusion.  White count elevated to 12  Review of Systems:    Pt denies any fevers, chills, nausea, vomiting, diarrhea, constipation, abdominal pain, palpitations, headache, vision changes, lightheadedness, dizziness, melena, rectal bleeding.  Review of systems are otherwise negative  Past Medical History:  Diagnosis Date  . Anxiety   . ARF (acute renal failure) ([place]) 08/07/2011   ?   Marland Kitchen Arthritis   . Cancer ([facility]) 2005   breast-right  . Chronic back pain   . COPD (chronic obstructive pulmonary disease) ([place])   . Depression   . Depression with anxiety   . Dyspnea    on exertion  . Fibromyalgia   . GERD (gastroesophageal reflux disease)   . History of radiation therapy 03/24/17-05/12/17   right lun 2 Gy in 30 fractions  . Hypercholesteremia   . Hypothyroidism   . Hypothyroidism 06/19/2017  . Incontinence   . Neuropathy   . Peripheral neuropathy   . Sciatic pain   . Sebaceous cyst    lt labial location    . Sleep apnea    uses CPAP  . Stage III squamous cell carcinoma of right lung ([place]) 03/10/2017  . Tobacco abuse    Past Surgical History:  Procedure Laterality Date  . bladder tack    . BREAST BIOPSY Right 12/29/2016   Procedure: RIGHT BREAST BIOPSY;  Surgeon: Aviva Signs, MD;  Location: [facility];  Service: General;  Laterality: Right;  . BREAST LUMPECTOMY     right with node dissection  . COLONOSCOPY  11/11/2010   QJJ:HERDEYCXKG rectal polyp and splenic flexure otherwise normal; pathology showed  tubular adenomas. Next TCS 10/2015.  Marland Kitchen ESOPHAGOGASTRODUODENOSCOPY  11/11/2010   YJE:HUDJS hiatal hernia, 89 French dilator  . ESOPHAGOGASTRODUODENOSCOPY (EGD) WITH ESOPHAGEAL DILATION N/A 06/21/2013   HFW:YOVZCH esophagus  - status post Maloney dilation s/p bx (benign esophageal bx)  . PORTACATH PLACEMENT Left 07/06/2017   Procedure: INSERTION PORT-A-CATH LEFT SUBCLAVIAN;  Surgeon: Virl Cagey, MD;  Location: [facility];  Service: General;  Laterality: Left;  Marland Kitchen VIDEO BRONCHOSCOPY WITH ENDOBRONCHIAL ULTRASOUND N/A 02/28/2017   Procedure: VIDEO BRONCHOSCOPY WITH ENDOBRONCHIAL ULTRASOUND;  Surgeon: Juanito Doom, MD;  Location: [place];  Service: Thoracic;  Laterality: N/A;   Social History:  reports that she has been smoking cigarettes.  She has a 35.00 pack-year smoking history. She has never used smokeless tobacco. She reports that she does not drink alcohol or use drugs. Patient lives at home  Allergies  Allergen Reactions  . Penicillins Anaphylaxis and Other (See Comments)    Has patient had a PCN  reaction causing immediate rash, facial/tongue/throat swelling, SOB or lightheadedness with hypotension:Yes Has patient had a PCN reaction causing severe rash involving mucus membranes or skin necrosis:Yes Has patient had a PCN reaction that required hospitalization:No Has patient had a PCN reaction occurring within the last 10 years:No If all of the above answers are "NO", then may proceed with  Cephalosporin use.     Family History  Problem Relation Age of Onset  . Cancer Other   . Heart attack Other   . Lung disease Other   . Emphysema Mother   . Pneumonia Mother   . Cancer Father   . Autoimmune disease Neg Hx       Prior to Admission medications   Medication Sig Start Date End Date Taking? Authorizing Provider  albuterol (PROVENTIL HFA;VENTOLIN HFA) 108 (90 BASE) MCG/ACT inhaler Inhale 2 puffs into the lungs every 6 (six) hours as needed for wheezing or shortness of breath.    [provider]  albuterol (PROVENTIL) (2.5 MG/3ML) 0.083% nebulizer solution Take 3 mLs (2.5 mg total) by nebulization every 6 (six) hours as needed for wheezing or shortness of breath. 11/29/17   Derek Jack, MD  budesonide-formoterol [facility]) 160-4.5 MCG/ACT inhaler Inhale 2 puffs into the lungs daily.     [provider]  gabapentin (NEURONTIN) 800 MG tablet Take 800 mg by mouth 4 (four) times daily.     [provider]  hydrOXYzine (VISTARIL) 25 MG capsule Take 1 capsule (25 mg total) by mouth 3 (three) times daily as needed for itching. 08/25/17   Jacquelin Hawking, NP  levalbuterol (XOPENEX) 0.63 MG/3ML nebulizer solution Take 3 mLs (0.63 mg total) by nebulization 3 (three) times daily. 05/21/17   Regalado, Belkys A, MD  levofloxacin (LEVAQUIN) 500 MG tablet Take 1 tablet (500 mg total) by mouth daily. 10/26/17   Higgs, Mathis Dad, MD  levothyroxine (SYNTHROID, LEVOTHROID) 100 MCG tablet Take 100 mcg by mouth daily before breakfast. Reported on 02/03/2016    [provider]  lidocaine-prilocaine (EMLA) cream Apply a quarter size amount to affected area 1 hour prior to coming to chemotherapy. 08/09/17   Twana First, MD  metoprolol tartrate (LOPRESSOR) 50 MG tablet Take 1 tablet (50 mg total) by mouth 2 (two) times daily. 10/26/17   Herminio Commons, MD  Misc. Devices MISC Please provide patient with a nebulizer machine 11/29/17   Derek Jack, MD    ondansetron (ZOFRAN) 8 MG tablet Take 1 tablet (8 mg total) every 8 (eight) hours as needed by mouth for nausea or vomiting. 06/28/17   Twana First, MD  potassium chloride (K-DUR) 10 MEQ tablet Take 2 tablets (20 mEq total) by mouth daily. 11/29/17   Derek Jack, MD  predniSONE (DELTASONE) 50 MG tablet One tablet PO daily for 4 days 11/26/17   Ripley Fraise, MD  prochlorperazine (COMPAZINE) 10 MG tablet Take 1 tablet (10 mg total) every 6 (six) hours as needed by mouth for nausea or vomiting. 06/28/17   Twana First, MD  Rivaroxaban 15 & 20 MG TBPK Take as directed on package: Start with one 15mg  tablet by mouth twice a day with food. On Day 22, switch to one 20mg  tablet once a day with food. 11/07/17   Virgel Manifold, MD  rosuvastatin (CRESTOR) 5 MG tablet Take 1 tablet (5 mg total) by mouth daily. 10/26/17 01/24/18  Herminio Commons, MD  tapentadol (NUCYNTA ER) 100 MG 12 hr tablet Take 100 mg by mouth every 12 (twelve) hours.  [provider]  tiotropium (SPIRIVA) 18 MCG inhalation capsule Place 1 capsule (18 mcg total) into inhaler and inhale daily. 01/31/15   Kathee Delton, MD    Physical Exam: BP (!) 146/103 (BP Location: Left Arm)   Pulse (!) 120   Temp (!) 97.5 F (36.4 C) (Oral)   Resp 14   Ht 5\' 1"  (1.549 m)   Wt 65.8 kg (145 lb)   SpO2 95%   BMI 27.40 kg/m   . General: Elderly Caucasian female. Awake and alert and oriented x3. No acute cardiopulmonary distress.  Marland Kitchen HEENT: Normocephalic atraumatic.  Right and left ears normal in appearance.  Pupils equal, round, reactive to light. Extraocular muscles are intact. Sclerae anicteric and noninjected.  Moist mucosal membranes. No mucosal lesions.  . Neck: Neck supple without lymphadenopathy. No carotid bruits. No masses palpated.  . Cardiovascular: Regular rate with normal S1-S2 sounds. No murmurs, rubs, gallops auscultated. No JVD.  Marland Kitchen Respiratory: Absent breath sounds in the right lower lung field.  Rales in the  upper lung field.  Diffuse wheezing throughout no accessory muscle use. . Abdomen: Soft, nontender, nondistended. Active bowel sounds. No masses or hepatosplenomegaly  . Skin: No rashes, lesions, or ulcerations.  Dry, warm to touch. 2+ dorsalis pedis and radial pulses. . Musculoskeletal: No calf or leg pain. All major joints not erythematous nontender.  No upper or lower joint deformation.  Good ROM.  No contractures  . Psychiatric: Intact judgment and insight. Pleasant and cooperative. . Neurologic: No focal neurological deficits. Strength is 5/5 and symmetric in upper and lower extremities.  Cranial nerves II through XII are grossly intact.           Labs on Admission: I have personally reviewed following labs and imaging studies  CBC: Recent Labs  Lab 01/13/18 1608  WBC 12.3*  NEUTROABS 10.5*  HGB 12.9  HCT 42.2  MCV 80.7  PLT 323   Basic Metabolic Panel: Recent Labs  Lab 01/13/18 1608  NA 131*  K 3.0*  CL 94*  CO2 28  GLUCOSE 98  BUN 24*  CREATININE 0.97  CALCIUM 8.1*   GFR: Estimated Creatinine Clearance: 53.6 mL/min (by C-G formula based on SCr of 0.97 mg/dL). Liver Function Tests: Recent Labs  Lab 01/13/18 1608  AST 856*  ALT 546*  ALKPHOS 98  BILITOT 1.7*  PROT 6.6  ALBUMIN 3.1*   No results for input(s): LIPASE, AMYLASE in the last 168 hours. No results for input(s): AMMONIA in the last 168 hours. Coagulation Profile: No results for input(s): INR, PROTIME in the last 168 hours. Cardiac Enzymes: No results for input(s): CKTOTAL, CKMB, CKMBINDEX, TROPONINI in the last 168 hours. BNP (last 3 results) No results for input(s): PROBNP in the last 8760 hours. HbA1C: No results for input(s): HGBA1C in the last 72 hours. CBG: No results for input(s): GLUCAP in the last 168 hours. Lipid Profile: No results for input(s): CHOL, HDL, LDLCALC, TRIG, CHOLHDL, LDLDIRECT in the last 72 hours. Thyroid Function Tests: No results for input(s): TSH, T4TOTAL,  FREET4, T3FREE, THYROIDAB in the last 72 hours. Anemia Panel: No results for input(s): VITAMINB12, FOLATE, FERRITIN, TIBC, IRON, RETICCTPCT in the last 72 hours. Urine analysis:    Component Value Date/Time   COLORURINE YELLOW 05/16/2017 1410   APPEARANCEUR CLEAR 05/16/2017 1410   LABSPEC 1.004 (L) 05/16/2017 1410   PHURINE 6.0 05/16/2017 1410   GLUCOSEU NEGATIVE 05/16/2017 1410   HGBUR NEGATIVE 05/16/2017 1410   HGBUR negative 02/26/2008 1027  BILIRUBINUR NEGATIVE 05/16/2017 1410   KETONESUR 5 (A) 05/16/2017 1410   PROTEINUR NEGATIVE 05/16/2017 1410   UROBILINOGEN 0.2 06/20/2012 1231   NITRITE NEGATIVE 05/16/2017 1410   LEUKOCYTESUR NEGATIVE 05/16/2017 1410   Sepsis Labs: @LABRCNTIP (procalcitonin:4,lacticidven:4) ) Recent Results (from the past 240 hour(s))  Blood Culture (routine x 2)     Status: None (Preliminary result)   Collection Time: 01/13/18  4:51 PM  Result Value Ref Range Status   Specimen Description BLOOD LEFT HAND  Final   Special Requests   Final    BOTTLES DRAWN AEROBIC AND ANAEROBIC Blood Culture adequate volume Performed at [facility], [address] [place], [place] [address]    Culture PENDING  Incomplete   Report Status PENDING  Incomplete  Blood Culture (routine x 2)     Status: None (Preliminary result)   Collection Time: 01/13/18  4:51 PM  Result Value Ref Range Status   Specimen Description BLOOD RIGHT HAND  Final   Special Requests   Final    BOTTLES DRAWN AEROBIC ONLY Blood Culture adequate volume Performed at [facility], [address], [place], [place] [address]    Culture PENDING  Incomplete   Report Status PENDING  Incomplete     Radiological Exams on Admission: Dg Chest 2 View  Result Date: 01/13/2018 CLINICAL DATA:  Shortness of breath. History of right lung carcinoma. History of breast carcinoma EXAM: CHEST - 2 VIEW COMPARISON:  PET-CT November 02, 2017 and chest radiograph November 25, 2017 FINDINGS: There is extensive scarring with  volume loss in the right upper lobe. There is a stable area of apparent cavitation in the right upper lobe region. There is elevation of the right hemidiaphragm with blunting of the right costophrenic angle, likely due to scarring. Left lung is clear. Heart size is normal. There is distortion of pulmonary vascularity on the right. Pulmonary vascularity on the left is normal. No adenopathy is demonstrable by radiography. Port-A-Cath tip is in the superior vena cava. No pneumothorax. No evident bone lesions. IMPRESSION: Extensive scarring with volume loss right upper lobe. Milder scarring noted right base. Chronic blunting of the right costophrenic angle noted. Stable area of cavitation right upper lobe posteriorly. These changes may be due to previous radiation therapy. No progression of cavitation in the right upper lobe. No new opacity. Left lung is clear. Stable cardiac silhouette. Port-A-Cath tip in superior vena cava. No evident pneumothorax. Electronically Signed   By: Lowella Grip III M.D.   On: 01/13/2018 15:51   Ct Angio Chest Pe W And/or Wo Contrast  Result Date: 01/13/2018 CLINICAL DATA:  Lung cancer, on chemotherapy. Worsening shortness of breath. EXAM: CT ANGIOGRAPHY CHEST WITH CONTRAST TECHNIQUE: Multidetector CT imaging of the chest was performed using the standard protocol during bolus administration of intravenous contrast. Multiplanar CT image reconstructions and MIPs were obtained to evaluate the vascular anatomy. CONTRAST:  170mL ISOVUE-370 IOPAMIDOL (ISOVUE-370) INJECTION 76% COMPARISON:  PET-CT from 11/02/2017. FINDINGS: Cardiovascular: Mildly reduced sensitivity in assessing for pulmonary embolus due to motion artifact. No filling defect is identified in the pulmonary arterial tree to suggest pulmonary embolus. Reduction in size of elements of the right pulmonary arterial tree likely attributable to hypoventilation related vaso constriction. Aortoiliac atherosclerotic vascular disease.  Mild right heart enlargement. Mediastinum/Nodes: Progressive infiltration in density in the mediastinal adipose tissues could be from infiltrative tumor, mediastinal edema, or mediastinitis. The mild adenopathy shown on the prior exam is still present but is indistinctly marginated. Distended right jugular vein. No pericardial  effusion. Lungs/Pleura: Progressive loculated pleural effusion on the right side; malignant effusion or empyema not excluded. Cavitary lesion in the right mid lung is probably in the upper lobe and contains an air-fluid level, measuring about 4.6 cm transverse, roughly similar to the prior exam. This could be a cavitary mass or cavitary pneumonia, the persistence favors the former. Increased right perihilar airspace opacity. Right paramediastinal airspace opacity and/or loculated fluid is increased. Paraseptal and centrilobular emphysema. Upper Abdomen: Extensive new ascites. Chronic left hydronephrosis. Aortoiliac atherosclerotic vascular disease. Musculoskeletal: Old right rib fractures, some nonunited, stable. Thoracic spondylosis. Review of the MIP images confirms the above findings. IMPRESSION: 1. No acute pulmonary embolus identified. 2. Notable and increased right perihilar and right paramediastinal airspace opacity, query tumor, pneumonia, or radiation pneumonitis. 3. Current moderate to large loculated right pleural effusion is new compared to 11/02/2017. Malignant effusion or empyema not excluded. There is also extensive new ascites and fluid infiltration of the mediastinum obscuring fatty tissues, which may be from third spacing of fluid, mediastinitis, or infiltrative tumor. No pericardial effusion. 4. Mild right heart enlargement. 5. Cavitary mass or cavitary pneumonia in the right upper lobe, essentially stable from June (which favors a mass). 6. Aortic Atherosclerosis (ICD10-I70.0) and Emphysema (ICD10-J43.9). 7. Chronic left hydronephrosis. Electronically Signed   By: Van Clines M.D.   On: 01/13/2018 19:20    EKG: Independently reviewed.  Sinus tachycardia.  Right axis deviation.  Slightly prolonged QT.  Unchanged from previous  Assessment/Plan: Principal Problem:   Shortness of breath Active Problems:   COPD exacerbation (HCC)   Hypokalemia   Stage III squamous cell carcinoma of right lung (HCC)   Community acquired pneumonia   Hypothyroidism   Pleural effusion    This patient was discussed with the ED physician, including pertinent vitals, physical exam findings, labs, and imaging.  We also discussed care given by the ED provider.  1. Shortness of breath a. Question COPD versus community-acquired pneumonia versus pleural effusion versus combination b. We will treat COPD exacerbation,  c. start on antibiotics d. obtain blood cultures e. Obtain procalcitonin f. IR consulted -we will arrange for thoracentesis tomorrow with culture of pleural fluid, cytology of pleural fluid g. Will also get urine for strep antigen and sputum culture 2. COPD exacerbation a. Start steroid b. Nebulizer treatments c. Continue inhaled corticosteroid and LABA 3. Pleural effusion a. IR consulted for thoracentesis tomorrow b. N.p.o. after midnight 4. Hypokalemia a. Potassium replacement 5. Stage III squamous cell carcinoma 6. Hypothyroidism a. Stable  DVT prophylaxis: On Xarelto Consultants: IR Code Status: Full code Family Communication: Exam and interview done with sister in the room Disposition Plan: Home following improvement of symptoms   Jacob Moores [facility] Pager [phone]  If 7PM-7AM, please contact night-coverage [email or web address] Password [email or web address]

## 2018-01-14 ENCOUNTER — Inpatient Hospital Stay (HOSPITAL_COMMUNITY): Payer: Medicaid Other

## 2018-01-14 ENCOUNTER — Encounter (HOSPITAL_COMMUNITY): Payer: Self-pay

## 2018-01-14 DIAGNOSIS — J90 Pleural effusion, not elsewhere classified: Secondary | ICD-10-CM

## 2018-01-14 DIAGNOSIS — J441 Chronic obstructive pulmonary disease with (acute) exacerbation: Secondary | ICD-10-CM

## 2018-01-14 DIAGNOSIS — C3491 Malignant neoplasm of unspecified part of right bronchus or lung: Secondary | ICD-10-CM

## 2018-01-14 DIAGNOSIS — J9622 Acute and chronic respiratory failure with hypercapnia: Secondary | ICD-10-CM

## 2018-01-14 DIAGNOSIS — J9621 Acute and chronic respiratory failure with hypoxia: Principal | ICD-10-CM

## 2018-01-14 DIAGNOSIS — E039 Hypothyroidism, unspecified: Secondary | ICD-10-CM

## 2018-01-14 DIAGNOSIS — J189 Pneumonia, unspecified organism: Secondary | ICD-10-CM

## 2018-01-14 DIAGNOSIS — E876 Hypokalemia: Secondary | ICD-10-CM

## 2018-01-14 DIAGNOSIS — R0602 Shortness of breath: Secondary | ICD-10-CM

## 2018-01-14 LAB — BLOOD GAS, ARTERIAL
ACID-BASE EXCESS: 3.7 mmol/L — AB (ref 0.0–2.0)
ACID-BASE EXCESS: 3.6 mmol/L — AB (ref 0.0–2.0)
BICARBONATE: 26.6 mmol/L (ref 20.0–28.0)
Bicarbonate: 26.6 mmol/L (ref 20.0–28.0)
DRAWN BY: 22223
Delivery systems: POSITIVE
Drawn by: 27016
Expiratory PAP: 5
FIO2: 50
Inspiratory PAP: 15
LHR: 8 {breaths}/min
O2 Content: 3 L/min
O2 SAT: 93.7 %
O2 Saturation: 97.2 %
PCO2 ART: 54.2 mmHg — AB (ref 32.0–48.0)
PO2 ART: 78.1 mmHg — AB (ref 83.0–108.0)
Patient temperature: 37
pCO2 arterial: 54.4 mmHg — ABNORMAL HIGH (ref 32.0–48.0)
pH, Arterial: 7.347 — ABNORMAL LOW (ref 7.350–7.450)
pH, Arterial: 7.344 — ABNORMAL LOW (ref 7.350–7.450)
pO2, Arterial: 104 mmHg (ref 83.0–108.0)

## 2018-01-14 LAB — CBC
HCT: 43.1 % (ref 36.0–46.0)
HEMOGLOBIN: 12.9 g/dL (ref 12.0–15.0)
MCH: 24.6 pg — ABNORMAL LOW (ref 26.0–34.0)
MCHC: 29.9 g/dL — ABNORMAL LOW (ref 30.0–36.0)
MCV: 82.1 fL (ref 78.0–100.0)
Platelets: 160 10*3/uL (ref 150–400)
RBC: 5.25 MIL/uL — AB (ref 3.87–5.11)
RDW: 18.3 % — ABNORMAL HIGH (ref 11.5–15.5)
WBC: 10.9 10*3/uL — AB (ref 4.0–10.5)

## 2018-01-14 LAB — BASIC METABOLIC PANEL
Anion gap: 9 (ref 5–15)
BUN: 15 mg/dL (ref 6–20)
CHLORIDE: 98 mmol/L — AB (ref 101–111)
CO2: 31 mmol/L (ref 22–32)
Calcium: 8.3 mg/dL — ABNORMAL LOW (ref 8.9–10.3)
Creatinine, Ser: 0.74 mg/dL (ref 0.44–1.00)
GFR calc non Af Amer: >60 mL/min (ref >60)
Glucose, Bld: 116 mg/dL — ABNORMAL HIGH (ref 65–99)
POTASSIUM: 3.2 mmol/L — AB (ref 3.5–5.1)
SODIUM: 138 mmol/L (ref 135–145)

## 2018-01-14 LAB — MRSA PCR SCREENING: MRSA by PCR: NEGATIVE

## 2018-01-14 LAB — STREP PNEUMONIAE URINARY ANTIGEN: STREP PNEUMO URINARY ANTIGEN: NEGATIVE

## 2018-01-14 MED ORDER — METOPROLOL TARTRATE 5 MG/5ML IV SOLN
5.0000 mg | Freq: Four times a day (QID) | INTRAVENOUS | Status: DC
  Administered 2018-01-14 – 2018-01-15 (×3): 5 mg via INTRAVENOUS
  Filled 2018-01-14 (×4): qty 5

## 2018-01-14 MED ORDER — POTASSIUM CHLORIDE 10 MEQ/100ML IV SOLN
10.0000 meq | INTRAVENOUS | Status: AC
  Administered 2018-01-14 – 2018-01-15 (×3): 10 meq via INTRAVENOUS
  Filled 2018-01-14 (×2): qty 100

## 2018-01-14 NOTE — Progress Notes (Signed)
1836 12 lead EKG obtained, sinus tachycardia with 1st degree AV heart block, abnormal QRS-T angle. MD aware and hard copy placed in patient's chart.

## 2018-01-14 NOTE — Progress Notes (Signed)
PA had arranged for patient to be transported to [facility] for thoracentesis today.  Report this AM from RN was that patient was stable and consentable.  Received a call from [place] prior to transport stating that patient was received tachycardic with HR 120-125, O2 sats 88-92%, and lethargic.  Due to concern for change in status and stability for transport, recommended patient return to unit for assessment by MD.  Thoracentesis order remains in place.  Will continue to work towards procedure as able based on status.  Brynda Greathouse, MS RD PA-C 2:20 PM

## 2018-01-14 NOTE — Progress Notes (Signed)
PROGRESS NOTE    Sydney Blair  CVE:[id number] DOB: 10/05/1955 DOA: 01/13/2018 PCP: [name], MD    Brief Narrative:  62 year old female with a history of stage III squamous cell lung cancer, recent DVT, oxygen dependent COPD, hypothyroidism, presented to the hospital with worsening shortness of breath.  Imaging of chest indicated pleural effusion as well as possible pneumonia versus progression of cancer.  She is been admitted for antibiotics, steroids and bronchodilators.  Plans are for thoracentesis when she is medically stable.  PCO2 started to trend up and she has been transferred to stepdown unit for BiPAP therapy.  She is somewhat lethargic at this time.   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 Acute on chronic respiratory failure with hypoxia/hypercapnia.  Patient is on chronic home oxygen.  She presents with worsening shortness of breath.  Worsening respiratory failure likely related to COPD/pneumonia/pleural effusion.  PCO2 started to trend up.  She is more somnolent and confused.  Will transfer to stepdown and initiate BiPAP therapy. 2. Metabolic encephalopathy.  Patient is somewhat lethargic/somnolent.  She has not received any sedative medications.  PCO2 is starting to trend up.  We will give a trial of BiPAP. 3. COPD exacerbation.  Continues to wheeze and has diminished breath sounds.  Continue on steroids, antibiotics and bronchodilators. 4. Community-acquired pneumonia.  Haziness on CT chest possible pneumonia.  She is on antibiotics. 5. Stage III squamous cell lung cancer of right lung.  Concerns that haziness on CT might be progression of malignancy. 6. Pleural effusion.  Possibly related to pneumonia versus malignant effusion.  She will need  thoracentesis to be performed  Plans were to transport patient agrees presents today to have this done after which she would be return to any pain.  Procedure was postponed due to concerns of her overall medical stability. 7. Hypokalemia.  Replace 8. Hypothyroidism.  Continue Synthroid 9. Tachycardia.  On auscultation appears regular.  Check EKG.  Possibly reactive to underlying respiratory issues. 10. Recent diagnosis of left upper extremity DVT in 10/2017, on Xarelto.   DVT prophylaxis: Xarelto Code Status: Full code Family Communication: Discussed with husband and 2 sisters Disposition Plan: Discharge home once improved   Consultants:     Procedures:     Antimicrobials:   Levofloxacin 5/24 >   Subjective: Patient is somnolent but wakes up to voice. She quickly falls back asleep after answering questions. She is confused.  Objective: Vitals:   01/14/18 1116 01/14/18 1326 01/14/18 1456 01/14/18 1458  BP:  (!) 145/104 (!) 154/108 (!) 144/106  Pulse:  (!) 119 (!) 121 (!) 121  Resp:  16 19   Temp:   98.2 F (36.8 C)   TempSrc:   Oral   SpO2: 95% 100% 91%   Weight:      Height:        Intake/Output Summary (Last 24 hours) at 01/14/2018 1714 Last data filed at 01/14/2018 1400 Gross per 24 hour  Intake 1473.33 ml  Output 1600 ml  Net -126.67 ml   Filed Weights   01/13/18 1527 01/13/18 2243  Weight: 65.8 kg (145 lb) 70.3 kg (155 lb)    Examination:  General exam: somnolent, but wakes up to voice Respiratory system: bilateral wheezing. Respiratory effort normal. Cardiovascular  system: S1 & S2 heard, RRR. No JVD, murmurs, rubs, gallops or clicks. No pedal edema. Gastrointestinal system: Abdomen is nondistended, soft and nontender. No organomegaly or masses felt. Normal bowel sounds heard. Central nervous system: No focal neurological deficits. Extremities: Symmetric 5 x 5 power. Skin: No rashes, lesions or ulcers Psychiatry: confused    Data Reviewed: I have  personally reviewed following labs and imaging studies  CBC: Recent Labs  Lab 01/13/18 1608 01/14/18 0628  WBC 12.3* 10.9*  NEUTROABS 10.5*  --   HGB 12.9 12.9  HCT 42.2 43.1  MCV 80.7 82.1  PLT 196 474   Basic Metabolic Panel: Recent Labs  Lab 01/13/18 1608 01/14/18 0628  NA 131* 138  K 3.0* 3.2*  CL 94* 98*  CO2 28 31  GLUCOSE 98 116*  BUN 24* 15  CREATININE 0.97 0.74  CALCIUM 8.1* 8.3*   GFR: Estimated Creatinine Clearance: 65.4 mL/min (by C-G formula based on SCr of 0.74 mg/dL). Liver Function Tests: Recent Labs  Lab 01/13/18 1608  AST 856*  ALT 546*  ALKPHOS 98  BILITOT 1.7*  PROT 6.6  ALBUMIN 3.1*   No results for input(s): LIPASE, AMYLASE in the last 168 hours. No results for input(s): AMMONIA in the last 168 hours. Coagulation Profile: No results for input(s): INR, PROTIME in the last 168 hours. Cardiac Enzymes: No results for input(s): CKTOTAL, CKMB, CKMBINDEX, TROPONINI in the last 168 hours. BNP (last 3 results) No results for input(s): PROBNP in the last 8760 hours. HbA1C: No results for input(s): HGBA1C in the last 72 hours. CBG: No results for input(s): GLUCAP in the last 168 hours. Lipid Profile: No results for input(s): CHOL, HDL, LDLCALC, TRIG, CHOLHDL, LDLDIRECT in the last 72 hours. Thyroid Function Tests: No results for input(s): TSH, T4TOTAL, FREET4, T3FREE, THYROIDAB in the last 72 hours. Anemia Panel: No results for input(s): VITAMINB12, FOLATE, FERRITIN, TIBC, IRON, RETICCTPCT in the last 72 hours. Sepsis Labs: Recent Labs  Lab 01/13/18 1606 01/13/18 1702 01/13/18 1828  PROCALCITON 0.56  --   --   LATICACIDVEN  --  1.04 1.79    Recent Results (from the past 240 hour(s))  Blood Culture (routine x 2)     Status: None (Preliminary result)   Collection Time: 01/13/18  4:51 PM  Result Value Ref Range Status   Specimen Description BLOOD LEFT HAND  Final   Special Requests   Final    BOTTLES DRAWN AEROBIC AND ANAEROBIC Blood  Culture adequate volume   Culture   Final    NO GROWTH < 12 HOURS Performed at [facility], [address] [place], [place] [address]    Report Status PENDING  Incomplete  Blood Culture (routine x 2)     Status: None (Preliminary result)   Collection Time: 01/13/18  4:51 PM  Result Value Ref Range Status   Specimen Description BLOOD RIGHT HAND  Final   Special Requests   Final    BOTTLES DRAWN AEROBIC ONLY Blood Culture adequate volume   Culture   Final    NO GROWTH < 12 HOURS Performed at [facility], [address], [place],  [address]    Report Status PENDING  Incomplete         Radiology Studies: Dg Chest 2 View  Result Date: 01/13/2018 CLINICAL DATA:  Shortness of breath. History of right lung carcinoma. History of breast carcinoma EXAM: CHEST - 2 VIEW COMPARISON:  PET-CT November 02, 2017 and chest radiograph November 25, 2017 FINDINGS: There  is extensive scarring with volume loss in the right upper lobe. There is a stable area of apparent cavitation in the right upper lobe region. There is elevation of the right hemidiaphragm with blunting of the right costophrenic angle, likely due to scarring. Left lung is clear. Heart size is normal. There is distortion of pulmonary vascularity on the right. Pulmonary vascularity on the left is normal. No adenopathy is demonstrable by radiography. Port-A-Cath tip is in the superior vena cava. No pneumothorax. No evident bone lesions. IMPRESSION: Extensive scarring with volume loss right upper lobe. Milder scarring noted right base. Chronic blunting of the right costophrenic angle noted. Stable area of cavitation right upper lobe posteriorly. These changes may be due to previous radiation therapy. No progression of cavitation in the right upper lobe. No new opacity. Left lung is clear. Stable cardiac silhouette. Port-A-Cath tip in superior vena cava. No evident pneumothorax. Electronically Signed   By: Lowella Grip III M.D.   On: 01/13/2018  15:51   Ct Angio Chest Pe W And/or Wo Contrast  Result Date: 01/13/2018 CLINICAL DATA:  Lung cancer, on chemotherapy. Worsening shortness of breath. EXAM: CT ANGIOGRAPHY CHEST WITH CONTRAST TECHNIQUE: Multidetector CT imaging of the chest was performed using the standard protocol during bolus administration of intravenous contrast. Multiplanar CT image reconstructions and MIPs were obtained to evaluate the vascular anatomy. CONTRAST:  142mL ISOVUE-370 IOPAMIDOL (ISOVUE-370) INJECTION 76% COMPARISON:  PET-CT from 11/02/2017. FINDINGS: Cardiovascular: Mildly reduced sensitivity in assessing for pulmonary embolus due to motion artifact. No filling defect is identified in the pulmonary arterial tree to suggest pulmonary embolus. Reduction in size of elements of the right pulmonary arterial tree likely attributable to hypoventilation related vaso constriction. Aortoiliac atherosclerotic vascular disease. Mild right heart enlargement. Mediastinum/Nodes: Progressive infiltration in density in the mediastinal adipose tissues could be from infiltrative tumor, mediastinal edema, or mediastinitis. The mild adenopathy shown on the prior exam is still present but is indistinctly marginated. Distended right jugular vein. No pericardial effusion. Lungs/Pleura: Progressive loculated pleural effusion on the right side; malignant effusion or empyema not excluded. Cavitary lesion in the right mid lung is probably in the upper lobe and contains an air-fluid level, measuring about 4.6 cm transverse, roughly similar to the prior exam. This could be a cavitary mass or cavitary pneumonia, the persistence favors the former. Increased right perihilar airspace opacity. Right paramediastinal airspace opacity and/or loculated fluid is increased. Paraseptal and centrilobular emphysema. Upper Abdomen: Extensive new ascites. Chronic left hydronephrosis. Aortoiliac atherosclerotic vascular disease. Musculoskeletal: Old right rib fractures, some  nonunited, stable. Thoracic spondylosis. Review of the MIP images confirms the above findings. IMPRESSION: 1. No acute pulmonary embolus identified. 2. Notable and increased right perihilar and right paramediastinal airspace opacity, query tumor, pneumonia, or radiation pneumonitis. 3. Current moderate to large loculated right pleural effusion is new compared to 11/02/2017. Malignant effusion or empyema not excluded. There is also extensive new ascites and fluid infiltration of the mediastinum obscuring fatty tissues, which may be from third spacing of fluid, mediastinitis, or infiltrative tumor. No pericardial effusion. 4. Mild right heart enlargement. 5. Cavitary mass or cavitary pneumonia in the right upper lobe, essentially stable from June (which favors a mass). 6. Aortic Atherosclerosis (ICD10-I70.0) and Emphysema (ICD10-J43.9). 7. Chronic left hydronephrosis. Electronically Signed   By: Van Clines M.D.   On: 01/13/2018 19:20        Scheduled Meds: . gabapentin  800 mg Oral QID  . guaiFENesin  600 mg Oral BID  . levalbuterol  0.63 mg Nebulization Q6H  . levothyroxine  100 mcg Oral QAC breakfast  . metoprolol tartrate  50 mg Oral BID  . mometasone-formoterol  2 puff Inhalation BID  . nicotine  14 mg Transdermal Daily  . potassium chloride SA  20 mEq Oral Daily  . [START ON 01/15/2018] predniSONE  40 mg Oral Q breakfast  . rivaroxaban  20 mg Oral Q supper  . rosuvastatin  5 mg Oral q1800  . tapentadol  100 mg Oral Q12H  . tiotropium  18 mcg Inhalation Daily   Continuous Infusions: . 0.9 % NaCl with KCl 40 mEq / L 100 mL/hr (01/14/18 1154)  . levofloxacin (LEVAQUIN) IV    . potassium chloride       LOS: 1 day    Critical care Time spent: 60mins    Kathie Dike, MD [facility] Pager [phone]  If 7PM-7AM, please contact night-coverage [email or web address] Password [facility] 01/14/2018, 5:14 PM

## 2018-01-14 NOTE — Progress Notes (Signed)
1750 Patient arrived from Dept 300 d/t c/o respiratory distress and need for BiPAP. Patient arrived to ICU room # 11 on O2 [place] @ 4L. Lilia Pro, RN reported that respiratory therapy was notified regarding need for patient to be placed on BiPAP.

## 2018-01-14 NOTE — Progress Notes (Signed)
Pt arrived to 301 via stretcher from ER. Pt noted to be mildly confused. Oriented to room and equipment. Currently on 3L [place]. States home dose is 2L [place]. Telemetry and pulse ox applied. HR 110-120s and DBP 100-110. VS have been this way in the ER. Metoprolol due at this time. PAS and skin swarm completed with Tish, RN. Port to Lt chest already accessed on arrival.  Blanchable redness noted to BLE. No other skin issues noted.

## 2018-01-15 LAB — CBC
HCT: 42.1 % (ref 36.0–46.0)
HEMOGLOBIN: 12.2 g/dL (ref 12.0–15.0)
MCH: 24.6 pg — AB (ref 26.0–34.0)
MCHC: 29 g/dL — ABNORMAL LOW (ref 30.0–36.0)
MCV: 84.9 fL (ref 78.0–100.0)
PLATELETS: 135 10*3/uL — AB (ref 150–400)
RBC: 4.96 MIL/uL (ref 3.87–5.11)
RDW: 18.7 % — ABNORMAL HIGH (ref 11.5–15.5)
WBC: 11.1 10*3/uL — ABNORMAL HIGH (ref 4.0–10.5)

## 2018-01-15 LAB — BASIC METABOLIC PANEL
Anion gap: 6 (ref 5–15)
BUN: 15 mg/dL (ref 6–20)
CHLORIDE: 102 mmol/L (ref 101–111)
CO2: 30 mmol/L (ref 22–32)
CREATININE: 0.67 mg/dL (ref 0.44–1.00)
Calcium: 8.5 mg/dL — ABNORMAL LOW (ref 8.9–10.3)
GFR calc Af Amer: >60 mL/min (ref >60)
Glucose, Bld: 113 mg/dL — ABNORMAL HIGH (ref 65–99)
POTASSIUM: 4.8 mmol/L (ref 3.5–5.1)
SODIUM: 138 mmol/L (ref 135–145)

## 2018-01-15 LAB — AMMONIA: AMMONIA: 23 umol/L (ref 9–35)

## 2018-01-15 MED ORDER — ORAL CARE MOUTH RINSE
15.0000 mL | Freq: Two times a day (BID) | OROMUCOSAL | Status: DC
  Administered 2018-01-15 – 2018-01-18 (×6): 15 mL via OROMUCOSAL

## 2018-01-15 MED ORDER — METOPROLOL TARTRATE 5 MG/5ML IV SOLN
5.0000 mg | Freq: Once | INTRAVENOUS | Status: AC
  Administered 2018-01-15: 5 mg via INTRAVENOUS
  Filled 2018-01-15: qty 5

## 2018-01-15 MED ORDER — METOPROLOL TARTRATE 5 MG/5ML IV SOLN
5.0000 mg | Freq: Four times a day (QID) | INTRAVENOUS | Status: DC | PRN
  Administered 2018-01-16 – 2018-01-17 (×3): 5 mg via INTRAVENOUS
  Filled 2018-01-15 (×3): qty 5

## 2018-01-15 MED ORDER — METOPROLOL TARTRATE 50 MG PO TABS
50.0000 mg | ORAL_TABLET | Freq: Two times a day (BID) | ORAL | Status: DC
  Administered 2018-01-15: 50 mg via ORAL
  Filled 2018-01-15: qty 1

## 2018-01-15 MED ORDER — METOPROLOL TARTRATE 5 MG/5ML IV SOLN
2.5000 mg | Freq: Four times a day (QID) | INTRAVENOUS | Status: DC | PRN
  Administered 2018-01-15: 2.5 mg via INTRAVENOUS
  Filled 2018-01-15: qty 5

## 2018-01-15 MED ORDER — DILTIAZEM HCL 25 MG/5ML IV SOLN
INTRAVENOUS | Status: AC
  Filled 2018-01-15: qty 5

## 2018-01-15 MED ORDER — DILTIAZEM HCL 25 MG/5ML IV SOLN
5.0000 mg | Freq: Once | INTRAVENOUS | Status: AC
Start: 2018-01-15 — End: 2018-01-15
  Administered 2018-01-15: 5 mg via INTRAVENOUS

## 2018-01-15 MED ORDER — POTASSIUM CHLORIDE 10 MEQ/100ML IV SOLN
INTRAVENOUS | Status: AC
  Administered 2018-01-15: 10 meq
  Filled 2018-01-15: qty 100

## 2018-01-15 MED ORDER — LABETALOL HCL 5 MG/ML IV SOLN
20.0000 mg | Freq: Once | INTRAVENOUS | Status: AC
  Administered 2018-01-15: 20 mg via INTRAVENOUS
  Filled 2018-01-15: qty 4

## 2018-01-15 MED ORDER — POTASSIUM CHLORIDE 10 MEQ/100ML IV SOLN
INTRAVENOUS | Status: AC
  Filled 2018-01-15: qty 100

## 2018-01-15 NOTE — Progress Notes (Addendum)
Discussed patient case with her husband who is Spanish-speaking through translator yesterday evening at approximately 7:30 PM.  After further discussion outlining risks and benefits of intubation, it was decided that patient would be DNR.  She was seen to have sluggish response to her left pupil for which CT of the head was performed demonstrating no acute abnormalities.  Shortly after this time, her mentation began to improve spontaneously but she continued to have elevated heart rate as well as blood pressure readings for which metoprolol was not helping.  Tried a push of Cardizem which also did not seem to help much.  Labetalol then given with good benefit to the blood pressure, but heart rate continues to remain somewhat elevated.  Addendum: Patient continues to remain somewhat tachycardic but is otherwise asymptomatic and more alert.  Will check twelve-lead EKG.  Additionally her liver enzymes seem to be elevated.  Awaiting repeat CMP.  Will check ammonia level as well.

## 2018-01-15 NOTE — Progress Notes (Signed)
PROGRESS NOTE    GLENNIE BOSE  VPX:[id number] DOB: [birth year] DOA: 01/13/2018 PCP: [name], MD    Brief Narrative:  62 year old female with a history of stage III squamous cell lung cancer, recent DVT, oxygen dependent COPD, hypothyroidism, presented to the hospital with worsening shortness of breath.  Imaging of chest indicated pleural effusion as well as possible pneumonia versus progression of cancer.  She is been admitted for antibiotics, steroids and bronchodilators.  Plans are for thoracentesis when she is medically stable.  PCO2 started to trend up and she has been transferred to stepdown unit for BiPAP therapy.  She is somewhat lethargic at this time.  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 Acute on chronic respiratory failure with hypoxia/hypercapnia.  Patient is on chronic home oxygen.  She presents with worsening shortness of breath.  Worsening respiratory failure likely related to COPD/pneumonia/pleural effusion.  PCO2 started to trend up.  She is more somnolent and confused. Pt was treated with bipap and is feeling a lot better today. Remain in SDU today. 2. Metabolic encephalopathy.  Improving.  She has not received any sedative medications.  PCO2 is starting to trend up and was placed on trial of BiPAP. 3. COPD exacerbation.  Improving.  Continues to wheeze and has diminished breath sounds.  Continue on steroids, antibiotics and bronchodilators. 4. Community-acquired pneumonia.  Haziness on CT chest possible pneumonia.  She is on antibiotics. 5. Stage III squamous cell lung cancer of right lung.  Concerns that haziness on CT might be progression of malignancy. She willl need to follow up with her oncologist.   6. Pleural effusion.  Possibly related to  pneumonia versus malignant effusion.  She will need thoracentesis to be performed.   Procedure was postponed due to concerns of her overall medical stability but hopefully could have done in next 1-2 days. 7. Hypokalemia.  Replace 8. Hypothyroidism.  Continue Synthroid 9. Tachycardia.  Beta blocker rebound tachycardia, Will restart her home lopressor 50 mg BID.  10. Recent diagnosis of left upper extremity DVT in 10/2017, on Xarelto.  DVT prophylaxis: Xarelto Code Status: Full code Family Communication: Discussed with husband and 2 sisters Disposition Plan: Discharge home once improved  Antimicrobials:   Levofloxacin 5/24 >   Subjective: Patient says that she feels a lot better today.    Objective: Vitals:   01/15/18 0800 01/15/18 0900 01/15/18 1002 01/15/18 1006  BP: (!) 135/93     Pulse: (!) 122 (!) 122    Resp: (!) 24 (!) 27    Temp:      TempSrc:      SpO2: 92% 91% 91% 91%  Weight:      Height:        Intake/Output Summary (Last 24 hours) at 01/15/2018 1039 Last data filed at 01/15/2018 0500 Gross per 24 hour  Intake 2050 ml  Output 1700 ml  Net 350 ml   Filed Weights   01/13/18 2243 01/14/18 1813 01/15/18 0500  Weight: 70.3 kg (155 lb) 65.7 kg (144 lb 13.5 oz) 65.1 kg (143 lb 8.3 oz)    Examination:  General exam: somnolent, but wakes up to voice Respiratory system: bilateral wheezing. Respiratory effort normal. Cardiovascular system: S1 & S2 heard, RRR. No JVD, murmurs, rubs, gallops or  clicks. No pedal edema. Gastrointestinal system: Abdomen is nondistended, soft and nontender. No organomegaly or masses felt. Normal bowel sounds heard. Central nervous system: No focal neurological deficits. Extremities: Symmetric 5 x 5 power. Skin: No rashes, lesions or ulcers Psychiatry: confused  Data Reviewed: I have personally reviewed following labs and imaging studies  CBC: Recent Labs  Lab 01/13/18 1608 01/14/18 0628 01/15/18 0438  WBC 12.3* 10.9* 11.1*    NEUTROABS 10.5*  --   --   HGB 12.9 12.9 12.2  HCT 42.2 43.1 42.1  MCV 80.7 82.1 84.9  PLT 196 160 885*   Basic Metabolic Panel: Recent Labs  Lab 01/13/18 1608 01/14/18 0628 01/15/18 0438  NA 131* 138 138  K 3.0* 3.2* 4.8  CL 94* 98* 102  CO2 28 31 30   GLUCOSE 98 116* 113*  BUN 24* 15 15  CREATININE 0.97 0.74 0.67  CALCIUM 8.1* 8.3* 8.5*   GFR: Estimated Creatinine Clearance: 67.9 mL/min (by C-G formula based on SCr of 0.67 mg/dL). Liver Function Tests: Recent Labs  Lab 01/13/18 1608  AST 856*  ALT 546*  ALKPHOS 98  BILITOT 1.7*  PROT 6.6  ALBUMIN 3.1*   No results for input(s): LIPASE, AMYLASE in the last 168 hours. Recent Labs  Lab 01/15/18 0527  AMMONIA 23   Coagulation Profile: No results for input(s): INR, PROTIME in the last 168 hours. Cardiac Enzymes: No results for input(s): CKTOTAL, CKMB, CKMBINDEX, TROPONINI in the last 168 hours. BNP (last 3 results) No results for input(s): PROBNP in the last 8760 hours. HbA1C: No results for input(s): HGBA1C in the last 72 hours. CBG: No results for input(s): GLUCAP in the last 168 hours. Lipid Profile: No results for input(s): CHOL, HDL, LDLCALC, TRIG, CHOLHDL, LDLDIRECT in the last 72 hours. Thyroid Function Tests: No results for input(s): TSH, T4TOTAL, FREET4, T3FREE, THYROIDAB in the last 72 hours. Anemia Panel: No results for input(s): VITAMINB12, FOLATE, FERRITIN, TIBC, IRON, RETICCTPCT in the last 72 hours. Sepsis Labs: Recent Labs  Lab 01/13/18 1606 01/13/18 1702 01/13/18 1828  PROCALCITON 0.56  --   --   LATICACIDVEN  --  1.04 1.79    Recent Results (from the past 240 hour(s))  Blood Culture (routine x 2)     Status: None (Preliminary result)   Collection Time: 01/13/18  4:51 PM  Result Value Ref Range Status   Specimen Description BLOOD LEFT HAND  Final   Special Requests   Final    BOTTLES DRAWN AEROBIC AND ANAEROBIC Blood Culture adequate volume   Culture   Final    NO GROWTH 2  DAYS Performed at [facility], [address], [place], [place] [address]    Report Status PENDING  Incomplete  Blood Culture (routine x 2)     Status: None (Preliminary result)   Collection Time: 01/13/18  4:51 PM  Result Value Ref Range Status   Specimen Description BLOOD RIGHT HAND  Final   Special Requests   Final    BOTTLES DRAWN AEROBIC ONLY Blood Culture adequate volume   Culture   Final    NO GROWTH 2 DAYS Performed at [facility], [address] [place], [place] [address]    Report Status PENDING  Incomplete  MRSA PCR Screening     Status: None   Collection Time: 01/14/18  6:17 PM  Result Value Ref Range Status   MRSA by PCR NEGATIVE NEGATIVE Final    Comment:        The GeneXpert MRSA  Assay (FDA approved for NASAL specimens only), is one component of a comprehensive MRSA colonization surveillance program. It is not intended to diagnose MRSA infection nor to guide or monitor treatment for MRSA infections. Performed at [facility], [address] [place], [place] [address]     Radiology Studies: Dg Chest 2 View  Result Date: 01/13/2018 CLINICAL DATA:  Shortness of breath. History of right lung carcinoma. History of breast carcinoma EXAM: CHEST - 2 VIEW COMPARISON:  PET-CT November 02, 2017 and chest radiograph November 25, 2017 FINDINGS: There is extensive scarring with volume loss in the right upper lobe. There is a stable area of apparent cavitation in the right upper lobe region. There is elevation of the right hemidiaphragm with blunting of the right costophrenic angle, likely due to scarring. Left lung is clear. Heart size is normal. There is distortion of pulmonary vascularity on the right. Pulmonary vascularity on the left is normal. No adenopathy is demonstrable by radiography. Port-A-Cath tip is in the superior vena cava. No pneumothorax. No evident bone lesions. IMPRESSION: Extensive scarring with volume loss right upper lobe. Milder scarring noted right base. Chronic  blunting of the right costophrenic angle noted. Stable area of cavitation right upper lobe posteriorly. These changes may be due to previous radiation therapy. No progression of cavitation in the right upper lobe. No new opacity. Left lung is clear. Stable cardiac silhouette. Port-A-Cath tip in superior vena cava. No evident pneumothorax. Electronically Signed   By: Lowella Grip III M.D.   On: 01/13/2018 15:51   Ct Head Wo Contrast  Result Date: 01/14/2018 CLINICAL DATA:  Altered mental status EXAM: CT HEAD WITHOUT CONTRAST TECHNIQUE: Contiguous axial images were obtained from the base of the skull through the vertex without intravenous contrast. COMPARISON:  06/20/2017 FINDINGS: Brain: There is no mass, hemorrhage or extra-axial collection. 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 The brain parenchyma is otherwise normal. The size and configuration of the ventricles and extra-axial CSF spaces are normal. Vascular: No hyperdense vessel or unexpected vascular calcification. Skull: Small osteoma near the right pterion. Sinuses/Orbits: No fluid levels or advanced mucosal thickening of the visualized paranasal sinuses. No mastoid or middle ear effusion. The orbits are normal. IMPRESSION: No acute abnormality. 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 Electronically Signed   By: Ulyses Jarred M.D.   On: 01/14/2018 22:47   Ct Angio Chest Pe W And/or Wo Contrast  Result Date: 01/13/2018 CLINICAL DATA:  Lung cancer, on chemotherapy. Worsening shortness of breath. EXAM: CT ANGIOGRAPHY CHEST WITH CONTRAST TECHNIQUE: Multidetector CT imaging of the chest was performed using the standard protocol during bolus administration of intravenous contrast. Multiplanar CT image reconstructions and MIPs were obtained to evaluate the vascular anatomy. CONTRAST:  188mL ISOVUE-370 IOPAMIDOL (ISOVUE-370) INJECTION 76% COMPARISON:  PET-CT from 11/02/2017. FINDINGS:  Cardiovascular: Mildly reduced sensitivity in assessing for pulmonary embolus due to motion artifact. No filling defect is identified in the pulmonary arterial tree to suggest pulmonary embolus. Reduction in size of elements of the right pulmonary arterial tree likely attributable to hypoventilation related vaso constriction. Aortoiliac atherosclerotic vascular disease. Mild right heart enlargement. Mediastinum/Nodes: Progressive infiltration in density in the mediastinal adipose tissues could be from infiltrative tumor, mediastinal edema, or mediastinitis. The mild adenopathy shown on the prior exam is still present but is indistinctly marginated. Distended right jugular vein. No pericardial effusion. Lungs/Pleura: Progressive loculated pleural effusion on the right side; malignant effusion or empyema not excluded. Cavitary lesion in the right mid  lung is probably in the upper lobe and contains an air-fluid level, measuring about 4.6 cm transverse, roughly similar to the prior exam. This could be a cavitary mass or cavitary pneumonia, the persistence favors the former. Increased right perihilar airspace opacity. Right paramediastinal airspace opacity and/or loculated fluid is increased. Paraseptal and centrilobular emphysema. Upper Abdomen: Extensive new ascites. Chronic left hydronephrosis. Aortoiliac atherosclerotic vascular disease. Musculoskeletal: Old right rib fractures, some nonunited, stable. Thoracic spondylosis. Review of the MIP images confirms the above findings. IMPRESSION: 1. No acute pulmonary embolus identified. 2. Notable and increased right perihilar and right paramediastinal airspace opacity, query tumor, pneumonia, or radiation pneumonitis. 3. Current moderate to large loculated right pleural effusion is new compared to 11/02/2017. Malignant effusion or empyema not excluded. There is also extensive new ascites and fluid infiltration of the mediastinum obscuring fatty tissues, which may be from  third spacing of fluid, mediastinitis, or infiltrative tumor. No pericardial effusion. 4. Mild right heart enlargement. 5. Cavitary mass or cavitary pneumonia in the right upper lobe, essentially stable from June (which favors a mass). 6. Aortic Atherosclerosis (ICD10-I70.0) and Emphysema (ICD10-J43.9). 7. Chronic left hydronephrosis. Electronically Signed   By: Van Clines M.D.   On: 01/13/2018 19:20   Dg Chest Port 1 View  Result Date: 01/14/2018 CLINICAL DATA:  Respiratory failure EXAM: PORTABLE CHEST 1 VIEW COMPARISON:  Chest radiograph and chest CT Jan 13, 2018 FINDINGS: There is partially loculated effusion on the right with areas of consolidation in the right perihilar and lower lobe regions. The area cavitation noted on CT 1 day prior is not appreciable on this portable chest radiographic examination. There is atelectatic change in the left perihilar region. No frank consolidation is noted on the left. Note that a small portion of the left base is not seen. Heart is upper normal in size with pulmonary vascularity within normal limits. There is aortic atherosclerosis. Port-A-Cath tip is in the superior vena cava. There is apparent adenopathy in the right paratracheal and right hilar regions. IMPRESSION: Extensive consolidation in portions of the right lung, most notably in the right perihilar and lower lobe regions. Partially loculated effusions seen throughout much the right hemithorax. There is adenopathy in the right hilar and paratracheal regions. Note that area cavitation noted on CT 1 day prior is not appreciable by frontal portable radiography. There is perihilar atelectasis on the left. Heart size normal. There is aortic atherosclerosis. Port-A-Cath tip in superior vena cava. No pneumothorax. Aortic Atherosclerosis (ICD10-I70.0). Electronically Signed   By: Lowella Grip III M.D.   On: 01/14/2018 17:46   Scheduled Meds: . gabapentin  800 mg Oral QID  . guaiFENesin  600 mg Oral BID    . levalbuterol  0.63 mg Nebulization Q6H  . levothyroxine  100 mcg Oral QAC breakfast  . metoprolol tartrate  50 mg Oral BID  . mometasone-formoterol  2 puff Inhalation BID  . nicotine  14 mg Transdermal Daily  . potassium chloride SA  20 mEq Oral Daily  . predniSONE  40 mg Oral Q breakfast  . rivaroxaban  20 mg Oral Q supper  . rosuvastatin  5 mg Oral q1800  . tiotropium  18 mcg Inhalation Daily   Continuous Infusions: . 0.9 % NaCl with KCl 40 mEq / L 100 mL/hr (01/15/18 0456)  . levofloxacin (LEVAQUIN) IV Stopped (01/14/18 2155)     LOS: 2 days    Critical care Time spent: 34mins  Irwin Brakeman, MD [facility] Pager [phone]  If 7PM-7AM, please  contact night-coverage [email or web address] Password [facility] 01/15/2018, 10:39 AM

## 2018-01-15 NOTE — Progress Notes (Signed)
Patient did not have any urine output for several hours, bladder was scanned and showed more than 800 ml. of urine. Dr. Manuella Ghazi was notified , order received to put foley catheter.

## 2018-01-15 NOTE — Progress Notes (Signed)
Dr. Manuella Ghazi is notified about patient's HR 119 after Cardizem IV bolus 5 mg and scheduled Metoprolol IV 5 mg . Pt also has involuntary jerky movements of the whole body. New orders received, continue to monitor the patient.

## 2018-01-15 NOTE — Progress Notes (Signed)
Dr Manuella Ghazi is notified about EKG results. Continue to monitor the patient.

## 2018-01-15 NOTE — Progress Notes (Signed)
Increased salter HFNC to 15L due to patient's low 02 saturations at 85%. Patient's o2 saturations now 92% and nebulizer given. RT will continue to monitor.

## 2018-01-16 ENCOUNTER — Encounter (HOSPITAL_COMMUNITY): Payer: Self-pay | Admitting: Primary Care

## 2018-01-16 DIAGNOSIS — Z7189 Other specified counseling: Secondary | ICD-10-CM

## 2018-01-16 DIAGNOSIS — Z515 Encounter for palliative care: Secondary | ICD-10-CM

## 2018-01-16 LAB — CBC WITH DIFFERENTIAL/PLATELET
BASOS PCT: 0 %
Basophils Absolute: 0 10*3/uL (ref 0.0–0.1)
EOS ABS: 0 10*3/uL (ref 0.0–0.7)
EOS PCT: 0 %
HCT: 42.6 % (ref 36.0–46.0)
HEMOGLOBIN: 12.2 g/dL (ref 12.0–15.0)
Lymphocytes Relative: 2 %
Lymphs Abs: 0.4 10*3/uL — ABNORMAL LOW (ref 0.7–4.0)
MCH: 24.5 pg — AB (ref 26.0–34.0)
MCHC: 28.6 g/dL — AB (ref 30.0–36.0)
MCV: 85.7 fL (ref 78.0–100.0)
MONO ABS: 1.7 10*3/uL — AB (ref 0.1–1.0)
Monocytes Relative: 9 %
NEUTROS PCT: 89 %
Neutro Abs: 18.2 10*3/uL — ABNORMAL HIGH (ref 1.7–7.7)
Platelets: 143 10*3/uL — ABNORMAL LOW (ref 150–400)
RBC: 4.97 MIL/uL (ref 3.87–5.11)
RDW: 19 % — ABNORMAL HIGH (ref 11.5–15.5)
WBC: 20.3 10*3/uL — ABNORMAL HIGH (ref 4.0–10.5)

## 2018-01-16 LAB — COMPREHENSIVE METABOLIC PANEL
ALT: 214 U/L — AB (ref 14–54)
ANION GAP: 4 — AB (ref 5–15)
AST: 87 U/L — ABNORMAL HIGH (ref 15–41)
Albumin: 2.8 g/dL — ABNORMAL LOW (ref 3.5–5.0)
Alkaline Phosphatase: 80 U/L (ref 38–126)
BILIRUBIN TOTAL: 1.2 mg/dL (ref 0.3–1.2)
BUN: 16 mg/dL (ref 6–20)
CALCIUM: 8.6 mg/dL — AB (ref 8.9–10.3)
CO2: 32 mmol/L (ref 22–32)
CREATININE: 0.73 mg/dL (ref 0.44–1.00)
Chloride: 104 mmol/L (ref 101–111)
Glucose, Bld: 116 mg/dL — ABNORMAL HIGH (ref 65–99)
Potassium: 4.9 mmol/L (ref 3.5–5.1)
SODIUM: 140 mmol/L (ref 135–145)
TOTAL PROTEIN: 6.2 g/dL — AB (ref 6.5–8.1)

## 2018-01-16 MED ORDER — SODIUM CHLORIDE 0.9% FLUSH: 10.0000 mL | INTRAVENOUS | Status: DC | PRN

## 2018-01-16 MED ORDER — MORPHINE SULFATE (PF) 2 MG/ML IV SOLN
1.0000 mg | INTRAVENOUS | Status: DC | PRN
  Administered 2018-01-16: 1 mg via INTRAVENOUS
  Filled 2018-01-16: qty 1

## 2018-01-16 MED ORDER — SODIUM CHLORIDE 0.9% FLUSH
10.0000 mL | Freq: Two times a day (BID) | INTRAVENOUS | Status: DC
  Administered 2018-01-16 – 2018-01-18 (×5): 10 mL

## 2018-01-16 MED ORDER — CHLORHEXIDINE GLUCONATE CLOTH 2 % EX PADS
6.0000 | MEDICATED_PAD | Freq: Every day | CUTANEOUS | Status: DC
  Administered 2018-01-16 – 2018-01-18 (×3): 6 via TOPICAL

## 2018-01-16 MED ORDER — METOPROLOL TARTRATE 50 MG PO TABS
62.5000 mg | ORAL_TABLET | Freq: Two times a day (BID) | ORAL | Status: DC
  Administered 2018-01-16 – 2018-01-17 (×3): 62.5 mg via ORAL
  Filled 2018-01-16 (×3): qty 1

## 2018-01-16 NOTE — Clinical Social Work Note (Signed)
Received LCSW consult for SNF placement for pt. Discussed pt with Dr. Wynetta Emery in [facility]gression this AM. Per Dr. Wynetta Emery, he is not sure how this consult got placed. He does not believe pt needs SNF at this time. He states pt will have thoracentesis tomorrow as well as a Palliative consult. He anticipates dc home in 1-2 days.   Will clear this consult for now. Please re-consult if CSW needs arise.

## 2018-01-16 NOTE — Consult Note (Signed)
Consultation Note Date: 01/16/2018   Patient Name: Sydney Blair  DOB: 1956/01/06  MRN: [id number]  Age / Sex: 62 y.o., female  PCP: [name], MD Referring Physician: Murlean Iba, MD  Reason for Consultation: Establishing goals of care  HPI/Patient Profile: 62 y.o. 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   Clinical Assessment and Goals of Care: Sydney Blair is resting quietly in bed.  She greets me making and keeping eye contact.  She looks much older than stated age, acutely ill and frail.  There is no family at bedside at this time. We talked about her oncology appointments.  Sydney Blair states that she has missed a few appointments where she was scheduled for infusion therapy.  She states her goal is to return to infusion therapy when possible.  Request that we contact oncology, she is hopeful they can see her while she is inpatient to set up a plan for further treatment. We talked about her scheduled thoracentesis tomorrow.  Sydney Blair states she is not had a thoracentesis in the past, and asks about the procedure.  We speak in generalities about what this will feel like, what will occur. We talked about healthcare power of attorney, see below. We talked about advanced directives, see below. We plan for follow-up meeting sometime tomorrow 5/28. Conference with nursing staff related to plan of care. Conference with hospitalist related to plan of care.  Healthcare power of attorney NEXT OF  KIN -husband Sydney Blair phone [phone].   SUMMARY OF RECOMMENDATIONS   At this point, continue to treat the treatable. Continue with chemotherapy if offered.  Code Status/Advance Care Planning:  DNR -verified with patient.  Ask if her husband and sisters could about her wish to allow a natural death.  She is somewhat tearful, but states that she believes they could.  We discussed the importance of having these discussions with her family.  Symptom Management:   Per hospitalist, no additional needs at this time.  Palliative Prophylaxis:   Frequent Pain Assessment  Additional Recommendations (Limitations, Scope, Preferences):  Continue to treat the treatable but no CPR, no intubation.  Psycho-social/Spiritual:   Desire for further Chaplaincy support:no  Additional Recommendations: Caregiving  Support/Resources and Education on Hospice  Prognosis:   Unable to determine, based on outcomes.  12 months or less would not be surprising based on history of lung cancer, continued tobacco use, complicated by relative Truman Hayward young age at 68.  Discharge Planning: To be determined, based on outcomes.  Rehab possible.      Primary Diagnoses: Present on Admission: . Shortness of breath . Stage III squamous cell carcinoma of right lung ([place]) . Hypothyroidism . Hypokalemia . COPD  exacerbation ([place]) . Pleural effusion   I have reviewed the medical record, interviewed the patient and family, and examined the patient. The following aspects are pertinent.  Past Medical History:  Diagnosis Date  . Anxiety   . ARF (acute renal failure) ([place]) 08/07/2011   ?   Marland Kitchen Arthritis   . Cancer ([place]) 2005   breast-right  . Chronic back pain   . COPD (chronic obstructive pulmonary disease) ([place])   . Depression   . Depression with anxiety   . Dyspnea    on exertion  . Fibromyalgia   . GERD (gastroesophageal reflux disease)   . History of radiation therapy 03/24/17-05/12/17   right lun 2  Gy in 30 fractions  . Hypercholesteremia   . Hypothyroidism   . Hypothyroidism 06/19/2017  . Incontinence   . Neuropathy   . Peripheral neuropathy   . Sciatic pain   . Sebaceous cyst    lt labial location  . Sleep apnea    uses CPAP  . Stage III squamous cell carcinoma of right lung ([place]) 03/10/2017  . Tobacco abuse    Social History   Socioeconomic History  . Marital status: Married    Spouse name: Not on file  . Number of children: 1  . Years of education: Not on file  . Highest education level: Not on file  Occupational History  . Occupation: disabled    Fish farm manager: UNEMPLOYED  Social Needs  . Financial resource strain: Not on file  . Food insecurity:    Worry: Not on file    Inability: Not on file  . Transportation needs:    Medical: Not on file    Non-medical: Not on file  Tobacco Use  . Smoking status: Current Every Day Smoker    Packs/day: 1.00    Years: 35.00    Pack years: 35.00    Types: Cigarettes  . Smokeless tobacco: Never Used  . Tobacco comment: has cut back to one pack daily  Substance and Sexual Activity  . Alcohol use: No    Alcohol/week: 0.0 oz  . Drug use: No  . Sexual activity: Yes    Birth control/protection: Post-menopausal    Comment: chemo stopped her periods  Lifestyle  . Physical activity:    Days per week: Not on file    Minutes per session: Not on file  . Stress: Not on file  Relationships  . Social connections:    Talks on phone: Not on file    Gets together: Not on file    Attends religious service: Not on file    Active member of club or organization: Not on file    Attends meetings of clubs or organizations: Not on file    Relationship status: Not on file  Other Topics Concern  . Not on file  Social History Narrative   Lives w. Husband   Lost 1 son-?etiology   Family History  Problem Relation Age of Onset  . Cancer Other   . Heart attack Other   . Lung disease Other   . Emphysema Mother   . Pneumonia Mother   .  Cancer Father   . Autoimmune disease Neg Hx    Scheduled Meds: . Chlorhexidine Gluconate Cloth  6 each Topical Daily  . gabapentin  800 mg Oral QID  . guaiFENesin  600 mg Oral BID  . levalbuterol  0.63 mg Nebulization Q6H  . levothyroxine  100 mcg Oral QAC breakfast  . mouth rinse  15 mL  Mouth Rinse BID  . metoprolol tartrate  62.5 mg Oral BID  . mometasone-formoterol  2 puff Inhalation BID  . nicotine  14 mg Transdermal Daily  . potassium chloride SA  20 mEq Oral Daily  . predniSONE  40 mg Oral Q breakfast  . rivaroxaban  20 mg Oral Q supper  . rosuvastatin  5 mg Oral q1800  . sodium chloride flush  10-40 mL Intracatheter Q12H  . tiotropium  18 mcg Inhalation Daily   Continuous Infusions: . 0.9 % NaCl with KCl 40 mEq / L 35 mL/hr (01/16/18 0802)  . levofloxacin (LEVAQUIN) IV Stopped (01/15/18 2255)   PRN Meds:.acetaminophen **OR** acetaminophen, albuterol, hydrOXYzine, metoprolol tartrate, morphine injection, ondansetron **OR** ondansetron (ZOFRAN) IV, prochlorperazine, sodium chloride flush Medications Prior to Admission:  Prior to Admission medications   Medication Sig Start Date End Date Taking? Authorizing Provider  albuterol (PROVENTIL HFA;VENTOLIN HFA) 108 (90 BASE) MCG/ACT inhaler Inhale 2 puffs into the lungs every 6 (six) hours as needed for wheezing or shortness of breath.   Yes [provider]  albuterol (PROVENTIL) (2.5 MG/3ML) 0.083% nebulizer solution Take 3 mLs (2.5 mg total) by nebulization every 6 (six) hours as needed for wheezing or shortness of breath. 11/29/17  Yes Derek Jack, MD  budesonide-formoterol [facility]) 160-4.5 MCG/ACT inhaler Inhale 2 puffs into the lungs daily.    Yes [provider]  dexlansoprazole (DEXILANT) 60 MG capsule Take 60 mg by mouth daily.   Yes [provider]  DULoxetine (CYMBALTA) 60 MG capsule Take 60 mg by mouth daily.   Yes [provider]  furosemide (LASIX) 20 MG tablet Take 20 mg by  mouth daily. As needed   Yes [provider]  gabapentin (NEURONTIN) 800 MG tablet Take 800 mg by mouth 4 (four) times daily.    Yes [provider]  levothyroxine (SYNTHROID, LEVOTHROID) 100 MCG tablet Take 100 mcg by mouth daily before breakfast. Reported on 02/03/2016   Yes [provider]  meclizine (ANTIVERT) 12.5 MG tablet Take 25 mg by mouth daily.   Yes [provider]  methocarbamol (ROBAXIN) 500 MG tablet Take 500 mg by mouth every 6 (six) hours as needed for muscle spasms.   Yes [provider]  metoprolol tartrate (LOPRESSOR) 50 MG tablet Take 1 tablet (50 mg total) by mouth 2 (two) times daily. 10/26/17  Yes Herminio Commons, MD  potassium chloride (K-DUR) 10 MEQ tablet Take 2 tablets (20 mEq total) by mouth daily. 11/29/17  Yes Derek Jack, MD  Rivaroxaban 15 & 20 MG TBPK Take as directed on package: Start with one 15mg  tablet by mouth twice a day with food. On Day 22, switch to one 20mg  tablet once a day with food. Patient taking differently: Take 20 mg by mouth daily. Take as directed on package: Start with one 15mg  tablet by mouth twice a day with food. On Day 22, switch to one 20mg  tablet once a day with food. 11/07/17  Yes Virgel Manifold, MD  rosuvastatin (CRESTOR) 5 MG tablet Take 1 tablet (5 mg total) by mouth daily. 10/26/17 01/24/18 Yes Herminio Commons, MD  tapentadol (NUCYNTA ER) 100 MG 12 hr tablet Take 100 mg by mouth every 12 (twelve) hours.    Yes [provider]  tiotropium (SPIRIVA) 18 MCG inhalation capsule Place 1 capsule (18 mcg total) into inhaler and inhale daily. 01/31/15  Yes Clance, Armando Reichert, MD  hydrOXYzine (VISTARIL) 25 MG capsule Take 1 capsule (25 mg total) by mouth  3 (three) times daily as needed for itching. Patient not taking: Reported on 01/14/2018 08/25/17   Jacquelin Hawking, NP  levofloxacin (LEVAQUIN) 500 MG tablet Take 1 tablet (500 mg total) by mouth daily. Patient not taking: Reported on  01/14/2018 10/26/17   Higgs, Mathis Dad, MD  lidocaine-prilocaine (EMLA) cream Apply a quarter size amount to affected area 1 hour prior to coming to chemotherapy. Patient not taking: Reported on 01/14/2018 08/09/17   Twana First, MD  Misc. Devices MISC Please provide patient with a nebulizer machine 11/29/17   Derek Jack, MD  predniSONE (DELTASONE) 50 MG tablet One tablet PO daily for 4 days Patient not taking: Reported on 01/14/2018 11/26/17   Ripley Fraise, MD   Allergies  Allergen Reactions  . Penicillins Anaphylaxis and Other (See Comments)    Has patient had a PCN reaction causing immediate rash, facial/tongue/throat swelling, SOB or lightheadedness with hypotension:Yes Has patient had a PCN reaction causing severe rash involving mucus membranes or skin necrosis:Yes Has patient had a PCN reaction that required hospitalization:No Has patient had a PCN reaction occurring within the last 10 years:No If all of the above answers are "NO", then may proceed with Cephalosporin use.    Review of Systems  Unable to perform ROS: Other    Physical Exam  Constitutional: She is oriented to person, place, and time. She appears ill.  Appears older than stated age, chronically ill.  Makes and keeps eye contact  HENT:  Head: Normocephalic and atraumatic.  Cardiovascular: Normal rate.  Pulmonary/Chest: Effort normal. No respiratory distress.  Abdominal: Soft. She exhibits no distension.  Neurological: She is alert and oriented to person, place, and time.  Skin: Skin is warm and dry.  Psychiatric: Her mood appears not anxious. She is not agitated.  Nursing note and vitals reviewed.   Vital Signs: BP (!) 141/99   Pulse (!) 130   Temp 97.9 F (36.6 C) (Axillary)   Resp 20   Ht 5\' 3"  (1.6 m)   Wt 68.1 kg (150 lb 2.1 oz)   SpO2 92%   BMI 26.59 kg/m  Pain Scale: 0-10   Pain Score: 2    SpO2: SpO2: 92 % O2 Device:SpO2: 92 % O2 Flow Rate: .O2 Flow Rate (L/min): 6 L/min  IO:  Intake/output summary:   Intake/Output Summary (Last 24 hours) at 01/16/2018 1414 Last data filed at 01/16/2018 0500 Gross per 24 hour  Intake 2525 ml  Output 1100 ml  Net 1425 ml    LBM: Last BM Date: 01/15/18 Baseline Weight: Weight: 65.8 kg (145 lb) Most recent weight: Weight: 68.1 kg (150 lb 2.1 oz)     Palliative Assessment/Data:   Flowsheet Rows     Most Recent Value  Intake Tab  Referral Department  Hospitalist  Unit at Time of Referral  Intermediate Care Unit  Palliative Care Primary Diagnosis  Pulmonary  Date Notified  01/16/18  Palliative Care Type  New Palliative care  Reason for referral  Clarify Goals of Care  Date of Admission  01/13/18  Date first seen by Palliative Care  01/16/18  # of days Palliative referral response time  0 Day(s)  # of days IP prior to Palliative referral  3  Clinical Assessment  Palliative Performance Scale Score  50%  Pain Max last 24 hours  Not able to report  Pain Min Last 24 hours  Not able to report  Dyspnea Max Last 24 Hours  Not able to report  Dyspnea Min Last 24 hours  Not able to report  Psychosocial & Spiritual Assessment  Palliative Care Outcomes  Patient/Family meeting held?  Yes  Who was at the meeting?  Patient at bedside  Palliative Care Outcomes  Clarified goals of care, Provided psychosocial or spiritual support, Provided advance care planning  Patient/Family wishes: Interventions discontinued/not started   Mechanical Ventilation      Time In: 1150 Time Out: 1240 Time Total: 50 minutes Greater than 50%  of this time was spent counseling and coordinating care related to the above assessment and plan.  Signed by: Drue Novel, NP   Please contact Palliative Medicine Team phone at [phone] for questions and concerns.  For individual provider: See Shea Evans

## 2018-01-16 NOTE — Progress Notes (Signed)
IR following for patient in need of thoracentesis.  Patient was able to come off bipap yesterday, she remains mildly tachycardic but is otherwise stable.  Per RN, she is alert and oriented today. Spoke with RN who is to arrange [facility] transport for patient to arrive to [facility] Radiology department by 11AM for procedure.  Requested family be available to come with patient as she has had intermittent confusion although mentation is reportedly significantly improved.   Brynda Greathouse, MS RD PA-C 9:13 AM

## 2018-01-16 NOTE — Progress Notes (Signed)
Pt given Metoprolol PRN or increased HR sustaining in the 120's. Pt had received po Metoprolol @ 2100 with no success. Will continue to monitor pt and page MD if no result.

## 2018-01-16 NOTE — Progress Notes (Addendum)
PROGRESS NOTE    Sydney Blair  HYI:[id number] DOB: 13-Mar-1956 DOA: 01/13/2018 PCP: [name], MD    Brief Narrative:  62 year old female with a history of stage III squamous cell lung cancer, recent DVT, oxygen dependent COPD, hypothyroidism, presented to the hospital with worsening shortness of breath.  Imaging of chest indicated pleural effusion as well as possible pneumonia versus progression of cancer.  She is been admitted for antibiotics, steroids and bronchodilators.  Plans are for thoracentesis when she is medically stable.  PCO2 started to trend up and she has been transferred to stepdown unit for BiPAP therapy.  She is somewhat lethargic at this time.  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 Acute on chronic respiratory failure with hypoxia/hypercapnia.  Clinically she is feeling a lot better.  Patient is on chronic home oxygen.  She presents with worsening shortness of breath.  Worsening respiratory failure likely related to COPD/pneumonia/pleural effusion.  PCO2 started to trend up.  She is more somnolent and confused. Pt was treated with bipap and is feeling a lot better today.  2. Metabolic encephalopathy.  Improved.  She is back to her baseline mental status.  3. COPD exacerbation.  Improving with treatment. Lung sounds are improving with treatment.  Continue on steroids, antibiotics and bronchodilators. 4. Community-acquired pneumonia.  Haziness on CT chest possible pneumonia.  She is on antibiotics. 5. Stage III squamous cell lung cancer of right lung.  Concerns that haziness on CT might be progression of malignancy. She willl need to follow up with her oncologist.   6. Large malignant Pleural effusion.   She will need thoracentesis to be performed hopefully  tomorrow.   Procedure was postponed from earlier but hopefully could have done tomorrow. 7. Leukocytosis - Suspect reaction to steroids, will follow.  CBC in AM.   8. Hypokalemia.  Replace.  9. Hypothyroidism.  Continue Synthroid.  10. Sinus Tachycardia. Unfortunately I suspect this is secondary to her large pleural effusion and progression of lung cancer.  Hopefully will improve after thoracentesis.  Increased her metoprolol to 62.5 mg BID.  11. Recent diagnosis of left upper extremity DVT in 10/2017, on Xarelto for full anticoagulation.   DVT prophylaxis: Xarelto Code Status: Full code Family Communication: Discussed with husband and 2 sisters Disposition Plan: Discharge home once improved  Antimicrobials:   Levofloxacin 5/24 >  Subjective: Patient says that she feels a lot better today.    Objective: Vitals:   01/16/18 0400 01/16/18 0457 01/16/18 0500 01/16/18 0600  BP: (!) 153/96 (!) 153/96 (!) 131/97 (!) 139/101  Pulse: (!) 125 (!) 126 (!) 127 (!) 124  Resp: (!) 23 (!) 21 18 (!) 21  Temp: 98.3 F (36.8 C)     TempSrc: Axillary     SpO2: 100% 96% 98% 97%  Weight:   68.1 kg (150 lb 2.1 oz)   Height:        Intake/Output Summary (Last 24 hours) at 01/16/2018 0754 Last data filed at 01/16/2018 0500 Gross per 24 hour  Intake 2525 ml  Output 1100 ml  Net 1425 ml   Filed Weights   01/14/18 1813 01/15/18 0500 01/16/18 0500  Weight: 65.7 kg (144 lb 13.5 oz) 65.1 kg (143 lb 8.3 oz) 68.1 kg (150 lb 2.1 oz)  Examination:  General exam: awake, alert, oriented x 3.  On bipap and talking.  Respiratory system: improved wheezing, no rales. Respiratory effort normal. Cardiovascular system: S1 & S2 heard, RRR. No JVD, murmurs, rubs, gallops or clicks. No pedal edema. Gastrointestinal system: Abdomen is nondistended, soft and nontender. No organomegaly or masses felt. Normal bowel sounds heard. Central nervous system: No focal neurological deficits. Extremities: Symmetric 5 x 5  power. Skin: No rashes, lesions or ulcers Psychiatry: normal affect.   Data Reviewed: I have personally reviewed following labs and imaging studies  CBC: Recent Labs  Lab 01/13/18 1608 01/14/18 0628 01/15/18 0438 01/16/18 0406  WBC 12.3* 10.9* 11.1* 20.3*  NEUTROABS 10.5*  --   --  18.2*  HGB 12.9 12.9 12.2 12.2  HCT 42.2 43.1 42.1 42.6  MCV 80.7 82.1 84.9 85.7  PLT 196 160 135* 947*   Basic Metabolic Panel: Recent Labs  Lab 01/13/18 1608 01/14/18 0628 01/15/18 0438 01/16/18 0406  NA 131* 138 138 140  K 3.0* 3.2* 4.8 4.9  CL 94* 98* 102 104  CO2 28 31 30  32  GLUCOSE 98 116* 113* 116*  BUN 24* 15 15 16   CREATININE 0.97 0.74 0.67 0.73  CALCIUM 8.1* 8.3* 8.5* 8.6*   GFR: Estimated Creatinine Clearance: 69.3 mL/min (by C-G formula based on SCr of 0.73 mg/dL). Liver Function Tests: Recent Labs  Lab 01/13/18 1608 01/16/18 0406  AST 856* 87*  ALT 546* 214*  ALKPHOS 98 80  BILITOT 1.7* 1.2  PROT 6.6 6.2*  ALBUMIN 3.1* 2.8*   No results for input(s): LIPASE, AMYLASE in the last 168 hours. Recent Labs  Lab 01/15/18 0527  AMMONIA 23   Coagulation Profile: No results for input(s): INR, PROTIME in the last 168 hours. Cardiac Enzymes: No results for input(s): CKTOTAL, CKMB, CKMBINDEX, TROPONINI in the last 168 hours. BNP (last 3 results) No results for input(s): PROBNP in the last 8760 hours. HbA1C: No results for input(s): HGBA1C in the last 72 hours. CBG: No results for input(s): GLUCAP in the last 168 hours. Lipid Profile: No results for input(s): CHOL, HDL, LDLCALC, TRIG, CHOLHDL, LDLDIRECT in the last 72 hours. Thyroid Function Tests: No results for input(s): TSH, T4TOTAL, FREET4, T3FREE, THYROIDAB in the last 72 hours. Anemia Panel: No results for input(s): VITAMINB12, FOLATE, FERRITIN, TIBC, IRON, RETICCTPCT in the last 72 hours. Sepsis Labs: Recent Labs  Lab 01/13/18 1606 01/13/18 1702 01/13/18 1828  PROCALCITON 0.56  --   --   LATICACIDVEN  --   1.04 1.79    Recent Results (from the past 240 hour(s))  Blood Culture (routine x 2)     Status: None (Preliminary result)   Collection Time: 01/13/18  4:51 PM  Result Value Ref Range Status   Specimen Description BLOOD LEFT HAND  Final   Special Requests   Final    BOTTLES DRAWN AEROBIC AND ANAEROBIC Blood Culture adequate volume   Culture   Final    NO GROWTH 3 DAYS Performed at [facility], [address], [place], [place] [address]    Report Status PENDING  Incomplete  Blood Culture (routine x 2)     Status: None (Preliminary result)   Collection Time: 01/13/18  4:51 PM  Result Value Ref Range Status   Specimen Description BLOOD RIGHT HAND  Final   Special Requests   Final    BOTTLES DRAWN AEROBIC ONLY Blood Culture adequate volume   Culture   Final    NO GROWTH 3 DAYS Performed  at [facility], [address] [place], [place] [address]    Report Status PENDING  Incomplete  MRSA PCR Screening     Status: None   Collection Time: 01/14/18  6:17 PM  Result Value Ref Range Status   MRSA by PCR NEGATIVE NEGATIVE Final    Comment:        The GeneXpert MRSA Assay (FDA approved for NASAL specimens only), is one component of a comprehensive MRSA colonization surveillance program. It is not intended to diagnose MRSA infection nor to guide or monitor treatment for MRSA infections. Performed at [facility], [address], [place], [place] [address]     Radiology Studies: Ct Head Wo Contrast  Result Date: 01/14/2018 CLINICAL DATA:  Altered mental status EXAM: CT HEAD WITHOUT CONTRAST TECHNIQUE: Contiguous axial images were obtained from the base of the skull through the vertex without intravenous contrast. COMPARISON:  06/20/2017 FINDINGS: Brain: There is no mass, hemorrhage or extra-axial collection. 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 The brain parenchyma is otherwise normal. The size and configuration of the ventricles and extra-axial CSF spaces are  normal. Vascular: No hyperdense vessel or unexpected vascular calcification. Skull: Small osteoma near the right pterion. Sinuses/Orbits: No fluid levels or advanced mucosal thickening of the visualized paranasal sinuses. No mastoid or middle ear effusion. The orbits are normal. IMPRESSION: No acute abnormality. 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 Electronically Signed   By: Ulyses Jarred M.D.   On: 01/14/2018 22:47   Dg Chest Port 1 View  Result Date: 01/14/2018 CLINICAL DATA:  Respiratory failure EXAM: PORTABLE CHEST 1 VIEW COMPARISON:  Chest radiograph and chest CT Jan 13, 2018 FINDINGS: There is partially loculated effusion on the right with areas of consolidation in the right perihilar and lower lobe regions. The area cavitation noted on CT 1 day prior is not appreciable on this portable chest radiographic examination. There is atelectatic change in the left perihilar region. No frank consolidation is noted on the left. Note that a small portion of the left base is not seen. Heart is upper normal in size with pulmonary vascularity within normal limits. There is aortic atherosclerosis. Port-A-Cath tip is in the superior vena cava. There is apparent adenopathy in the right paratracheal and right hilar regions. IMPRESSION: Extensive consolidation in portions of the right lung, most notably in the right perihilar and lower lobe regions. Partially loculated effusions seen throughout much the right hemithorax. There is adenopathy in the right hilar and paratracheal regions. Note that area cavitation noted on CT 1 day prior is not appreciable by frontal portable radiography. There is perihilar atelectasis on the left. Heart size normal. There is aortic atherosclerosis. Port-A-Cath tip in superior vena cava. No pneumothorax. Aortic Atherosclerosis (ICD10-I70.0). Electronically Signed   By: Lowella Grip III M.D.   On: 01/14/2018 17:46   Scheduled Meds: . Chlorhexidine  Gluconate Cloth  6 each Topical Daily  . gabapentin  800 mg Oral QID  . guaiFENesin  600 mg Oral BID  . levalbuterol  0.63 mg Nebulization Q6H  . levothyroxine  100 mcg Oral QAC breakfast  . mouth rinse  15 mL Mouth Rinse BID  . metoprolol tartrate  62.5 mg Oral BID  . mometasone-formoterol  2 puff Inhalation BID  . nicotine  14 mg Transdermal Daily  . potassium chloride SA  20 mEq Oral Daily  . predniSONE  40 mg Oral Q breakfast  . rivaroxaban  20 mg Oral Q supper  .  rosuvastatin  5 mg Oral q1800  . sodium chloride flush  10-40 mL Intracatheter Q12H  . tiotropium  18 mcg Inhalation Daily   Continuous Infusions: . 0.9 % NaCl with KCl 40 mEq / L 60 mL/hr (01/16/18 2202)  . levofloxacin (LEVAQUIN) IV Stopped (01/15/18 2255)     LOS: 3 days   Critical care Time spent: 42mins  Irwin Brakeman, MD [facility] Pager [phone]  If 7PM-7AM, please contact night-coverage [email or web address] Password [facility] 01/16/2018, 7:54 AM

## 2018-01-16 NOTE — Progress Notes (Signed)
**Note De-Identified  Obfuscation** Patient removed from BIPAP and placed on 10 L HFNC. Patient tolerating well at this time RRT to continue to monitor.

## 2018-01-17 ENCOUNTER — Encounter (HOSPITAL_COMMUNITY): Payer: Self-pay

## 2018-01-17 ENCOUNTER — Inpatient Hospital Stay (HOSPITAL_COMMUNITY): Payer: Medicaid Other

## 2018-01-17 LAB — CBC
HEMATOCRIT: 43.9 % (ref 36.0–46.0)
Hemoglobin: 12.5 g/dL (ref 12.0–15.0)
MCH: 24.5 pg — ABNORMAL LOW (ref 26.0–34.0)
MCHC: 28.5 g/dL — AB (ref 30.0–36.0)
MCV: 86.1 fL (ref 78.0–100.0)
PLATELETS: 127 10*3/uL — AB (ref 150–400)
RBC: 5.1 MIL/uL (ref 3.87–5.11)
RDW: 18.9 % — AB (ref 11.5–15.5)
WBC: 18 10*3/uL — AB (ref 4.0–10.5)

## 2018-01-17 LAB — BASIC METABOLIC PANEL
ANION GAP: 5 (ref 5–15)
BUN: 20 mg/dL (ref 6–20)
CALCIUM: 8.1 mg/dL — AB (ref 8.9–10.3)
CO2: 29 mmol/L (ref 22–32)
Chloride: 101 mmol/L (ref 101–111)
Creatinine, Ser: 0.73 mg/dL (ref 0.44–1.00)
Glucose, Bld: 80 mg/dL (ref 65–99)
Potassium: 5.2 mmol/L — ABNORMAL HIGH (ref 3.5–5.1)
SODIUM: 135 mmol/L (ref 135–145)

## 2018-01-17 LAB — GRAM STAIN: GRAM STAIN: NONE SEEN

## 2018-01-17 LAB — BODY FLUID CELL COUNT WITH DIFFERENTIAL
EOS FL: 0 %
LYMPHS FL: 51 %
Monocyte-Macrophage-Serous Fluid: 13 % — ABNORMAL LOW (ref 50–90)
NEUTROPHIL FLUID: 36 % — AB (ref 0–25)
WBC FLUID: 443 uL (ref 0–1000)

## 2018-01-17 MED ORDER — METOPROLOL TARTRATE 50 MG PO TABS
75.0000 mg | ORAL_TABLET | Freq: Two times a day (BID) | ORAL | Status: DC
  Administered 2018-01-17 – 2018-01-18 (×2): 75 mg via ORAL
  Filled 2018-01-17 (×2): qty 1

## 2018-01-17 MED ORDER — FUROSEMIDE 10 MG/ML IJ SOLN
30.0000 mg | Freq: Once | INTRAMUSCULAR | Status: AC
  Administered 2018-01-17: 30 mg via INTRAVENOUS
  Filled 2018-01-17: qty 4

## 2018-01-17 NOTE — Plan of Care (Signed)
Patient has call bell at bedside and is alert and oriented. She understands to use call bell for assistance getting out of bed.

## 2018-01-17 NOTE — Progress Notes (Signed)
PROGRESS NOTE    Sydney Blair  FAO:[id number] DOB: 1955/12/31 DOA: 01/13/2018 PCP: [name], MD    Brief Narrative:  62 year old female with a history of stage III squamous cell lung cancer, recent DVT, oxygen dependent COPD, hypothyroidism, presented to the hospital with worsening shortness of breath.  Imaging of chest indicated pleural effusion as well as possible pneumonia versus progression of cancer.  She is been admitted for antibiotics, steroids and bronchodilators.  Plans are for thoracentesis when she is medically stable.  PCO2 started to trend up and she has been transferred to stepdown unit for BiPAP therapy.  She is somewhat lethargic at this time.  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 Acute on chronic respiratory failure with hypoxia/hypercapnia.  Clinically she is feeling a lot better.  Patient is on chronic home oxygen.  She presents with worsening shortness of breath.  Worsening respiratory failure likely related to COPD/pneumonia/pleural effusion.  PCO2 started to trend up.  She was more somnolent and confused but that his improved considerably and she is back to her baseline mental status. Pt was treated with bipap and is feeling a lot better.  2. Metabolic encephalopathy.  Improved.  She is back to her baseline mental status.  3. COPD exacerbation.  Improving with treatment. Lung sounds are improving with treatment.  Continue on steroids, antibiotics and bronchodilators. 4. Community-acquired pneumonia.  Haziness on CT chest possible pneumonia.  She is on antibiotics. 5. Stage III squamous cell lung cancer of right lung.  Concerns that haziness on CT might be progression of malignancy. She willl need to follow up with her  oncologist.   6. Large malignant Pleural effusion.   She will have thoracentesis to be performed today with I.R.  7. Leukocytosis - Suspect reaction to steroids, will follow.  CBC in AM. Slowly trending down.   8. Hypokalemia.  Repleted.  9. Hypothyroidism.  Continue Synthroid.  10. Sinus Tachycardia. Unfortunately I suspect this is secondary to her large pleural effusion and progression of lung cancer.  Hopefully will improve after thoracentesis.  Increased her metoprolol to 62.5 mg BID.  11. Recent diagnosis of left upper extremity DVT in 10/2017, on Xarelto for full anticoagulation.   DVT prophylaxis: Xarelto Code Status: Full code Family Communication: Discussed with husband and 2 sisters Disposition Plan: Discharge home possibly 5/29 or 5/30 pending clinical improvement  Antimicrobials:   Levofloxacin 5/24 >  Subjective: Patient anxious to have thoracentesis done today, sitting up in chair, no complaints.     Objective: Vitals:   01/17/18 0800 01/17/18 1200 01/17/18 1343 01/17/18 1407  BP:   (!) 130/100 125/81  Pulse:   (!) 130 (!) 135  Resp:   (!) 28 (!) 28  Temp: 98.7 F (37.1 C) 98.4 F (36.9 C)    TempSrc: Axillary Axillary    SpO2:   90% 98%  Weight:      Height:        Intake/Output Summary (Last 24 hours) at 01/17/2018 1619 Last data filed at 01/17/2018 1037 Gross per 24 hour  Intake 1300.83 ml  Output 3050 ml  Net -1749.17 ml   Filed Weights   01/15/18 0500 01/16/18 0500 01/17/18 0500  Weight: 65.1 kg (143 lb 8.3  oz) 68.1 kg (150 lb 2.1 oz) 70.5 kg (155 lb 6.8 oz)    Examination:  General exam: awake, alert, oriented x 3.  Sitting up in chair.   Respiratory system: improved wheezing, no rales. Respiratory effort normal. Cardiovascular system: S1 & S2 heard, tachycardic. No JVD, murmurs, rubs, gallops or clicks. No pedal edema. Gastrointestinal system: Abdomen is nondistended, soft and nontender. No organomegaly or masses felt. Normal bowel sounds  heard. Central nervous system: No focal neurological deficits. Extremities: Symmetric 5 x 5 power. Skin: No rashes, lesions or ulcers Psychiatry: normal affect.   Data Reviewed: I have personally reviewed following labs and imaging studies  CBC: Recent Labs  Lab 01/13/18 1608 01/14/18 0628 01/15/18 0438 01/16/18 0406 01/17/18 0548  WBC 12.3* 10.9* 11.1* 20.3* 18.0*  NEUTROABS 10.5*  --   --  18.2*  --   HGB 12.9 12.9 12.2 12.2 12.5  HCT 42.2 43.1 42.1 42.6 43.9  MCV 80.7 82.1 84.9 85.7 86.1  PLT 196 160 135* 143* 017*   Basic Metabolic Panel: Recent Labs  Lab 01/13/18 1608 01/14/18 0628 01/15/18 0438 01/16/18 0406 01/17/18 0548  NA 131* 138 138 140 135  K 3.0* 3.2* 4.8 4.9 5.2*  CL 94* 98* 102 104 101  CO2 28 31 30  32 29  GLUCOSE 98 116* 113* 116* 80  BUN 24* 15 15 16 20   CREATININE 0.97 0.74 0.67 0.73 0.73  CALCIUM 8.1* 8.3* 8.5* 8.6* 8.1*   GFR: Estimated Creatinine Clearance: 70.4 mL/min (by C-G formula based on SCr of 0.73 mg/dL). Liver Function Tests: Recent Labs  Lab 01/13/18 1608 01/16/18 0406  AST 856* 87*  ALT 546* 214*  ALKPHOS 98 80  BILITOT 1.7* 1.2  PROT 6.6 6.2*  ALBUMIN 3.1* 2.8*   No results for input(s): LIPASE, AMYLASE in the last 168 hours. Recent Labs  Lab 01/15/18 0527  AMMONIA 23   Coagulation Profile: No results for input(s): INR, PROTIME in the last 168 hours. Cardiac Enzymes: No results for input(s): CKTOTAL, CKMB, CKMBINDEX, TROPONINI in the last 168 hours. BNP (last 3 results) No results for input(s): PROBNP in the last 8760 hours. HbA1C: No results for input(s): HGBA1C in the last 72 hours. CBG: No results for input(s): GLUCAP in the last 168 hours. Lipid Profile: No results for input(s): CHOL, HDL, LDLCALC, TRIG, CHOLHDL, LDLDIRECT in the last 72 hours. Thyroid Function Tests: No results for input(s): TSH, T4TOTAL, FREET4, T3FREE, THYROIDAB in the last 72 hours. Anemia Panel: No results for input(s): VITAMINB12,  FOLATE, FERRITIN, TIBC, IRON, RETICCTPCT in the last 72 hours. Sepsis Labs: Recent Labs  Lab 01/13/18 1606 01/13/18 1702 01/13/18 1828  PROCALCITON 0.56  --   --   LATICACIDVEN  --  1.04 1.79    Recent Results (from the past 240 hour(s))  Blood Culture (routine x 2)     Status: None (Preliminary result)   Collection Time: 01/13/18  4:51 PM  Result Value Ref Range Status   Specimen Description BLOOD LEFT HAND  Final   Special Requests   Final    BOTTLES DRAWN AEROBIC AND ANAEROBIC Blood Culture adequate volume   Culture   Final    NO GROWTH 4 DAYS Performed at [facility], [address], [place], [place] [address]    Report Status PENDING  Incomplete  Blood Culture (routine x 2)     Status: None (Preliminary result)   Collection Time: 01/13/18  4:51 PM  Result Value Ref Range Status   Specimen Description  BLOOD RIGHT HAND  Final   Special Requests   Final    BOTTLES DRAWN AEROBIC ONLY Blood Culture adequate volume   Culture   Final    NO GROWTH 4 DAYS Performed at [facility], [address], [place], [place] [address]    Report Status PENDING  Incomplete  MRSA PCR Screening     Status: None   Collection Time: 01/14/18  6:17 PM  Result Value Ref Range Status   MRSA by PCR NEGATIVE NEGATIVE Final    Comment:        The GeneXpert MRSA Assay (FDA approved for NASAL specimens only), is one component of a comprehensive MRSA colonization surveillance program. It is not intended to diagnose MRSA infection nor to guide or monitor treatment for MRSA infections. Performed at [facility], [address] [place], [place] [address]   Gram stain     Status: None   Collection Time: 01/17/18  1:33 PM  Result Value Ref Range Status   Specimen Description PLEURAL  Final   Special Requests NONE  Final   Gram Stain   Final    NO ORGANISMS SEEN CYTOSPIN SMEAR WBC PRESENT,BOTH PMN AND MONONUCLEAR Performed at [facility], [address] [place], [place] [address]    Report  Status 01/17/2018 FINAL  Final    Radiology Studies: Dg Chest 1 View  Result Date: 01/17/2018 CLINICAL DATA:  Post right thoracentesis EXAM: CHEST  1 VIEW COMPARISON:  01/14/2018 FINDINGS: Decreasing right effusion following thoracentesis. No pneumothorax. Persistent right upper lobe perihilar opacity and right apical pleural thickening. Left Port-A-Cath remains in place, unchanged. Mild cardiomegaly. IMPRESSION: Decreasing right effusion following thoracentesis.  No pneumothorax. Electronically Signed   By: Rolm Baptise M.D.   On: 01/17/2018 14:37   US Thoracentesis Asp Pleural Space W/img Guide  Result Date: 01/17/2018 INDICATION: Right pleural effusion EXAM: ULTRASOUND GUIDED RIGHT THORACENTESIS MEDICATIONS: None. COMPLICATIONS: None immediate. PROCEDURE: An ultrasound guided thoracentesis was thoroughly discussed with the patient and questions answered. The benefits, risks, alternatives and complications were also discussed. The patient understands and wishes to proceed with the procedure. Written consent was obtained. Ultrasound was performed to localize and mark an adequate pocket of fluid in the right posterior chest. The area was then prepped and draped in the normal sterile fashion. 1% Lidocaine was used for local anesthesia. Under ultrasound guidance a 19 gauge, 7-cm, Yueh catheter was introduced. Thoracentesis was performed. The catheter was removed and a dressing applied. FINDINGS: A total of approximately 1.3 liters of yellow fluid was removed. Samples were sent to the laboratory as requested by the clinical team. IMPRESSION: Successful ultrasound guided right thoracentesis yielding 1.3 liters of pleural fluid. Electronically Signed   By: Rolm Baptise M.D.   On: 01/17/2018 14:37   Scheduled Meds: . Chlorhexidine Gluconate Cloth  6 each Topical Daily  . gabapentin  800 mg Oral QID  . guaiFENesin  600 mg Oral BID  . levalbuterol  0.63 mg Nebulization Q6H  . levothyroxine  100 mcg Oral QAC  breakfast  . mouth rinse  15 mL Mouth Rinse BID  . metoprolol tartrate  62.5 mg Oral BID  . mometasone-formoterol  2 puff Inhalation BID  . nicotine  14 mg Transdermal Daily  . predniSONE  40 mg Oral Q breakfast  . rivaroxaban  20 mg Oral Q supper  . rosuvastatin  5 mg Oral q1800  . sodium chloride flush  10-40 mL Intracatheter Q12H  . tiotropium  18 mcg Inhalation Daily  Continuous Infusions: . levofloxacin (LEVAQUIN) IV Stopped (01/16/18 2113)     LOS: 4 days   Critical care Time spent: 5mins  Irwin Brakeman, MD [facility] Pager [phone]  If 7PM-7AM, please contact night-coverage [email or web address] Password [facility] 01/17/2018, 4:19 PM

## 2018-01-17 NOTE — Progress Notes (Signed)
Thoracentesis complete no signs of distress, 1.3L pleural fluid removed.

## 2018-01-18 DIAGNOSIS — Z7189 Other specified counseling: Secondary | ICD-10-CM

## 2018-01-18 LAB — CBC WITH DIFFERENTIAL/PLATELET
BASOS ABS: 0 10*3/uL (ref 0.0–0.1)
BASOS PCT: 0 %
Eosinophils Absolute: 0.1 10*3/uL (ref 0.0–0.7)
Eosinophils Relative: 0 %
HEMATOCRIT: 43.1 % (ref 36.0–46.0)
HEMOGLOBIN: 12.6 g/dL (ref 12.0–15.0)
LYMPHS PCT: 7 %
Lymphs Abs: 1 10*3/uL (ref 0.7–4.0)
MCH: 24.5 pg — ABNORMAL LOW (ref 26.0–34.0)
MCHC: 29.2 g/dL — AB (ref 30.0–36.0)
MCV: 83.9 fL (ref 78.0–100.0)
MONO ABS: 1.2 10*3/uL — AB (ref 0.1–1.0)
MONOS PCT: 9 %
NEUTROS ABS: 11.8 10*3/uL — AB (ref 1.7–7.7)
NEUTROS PCT: 84 %
Platelets: 104 10*3/uL — ABNORMAL LOW (ref 150–400)
RBC: 5.14 MIL/uL — ABNORMAL HIGH (ref 3.87–5.11)
RDW: 18.6 % — AB (ref 11.5–15.5)
WBC: 14.1 10*3/uL — ABNORMAL HIGH (ref 4.0–10.5)

## 2018-01-18 LAB — BASIC METABOLIC PANEL
ANION GAP: 6 (ref 5–15)
BUN: 18 mg/dL (ref 6–20)
CALCIUM: 8.1 mg/dL — AB (ref 8.9–10.3)
CO2: 35 mmol/L — AB (ref 22–32)
Chloride: 94 mmol/L — ABNORMAL LOW (ref 101–111)
Creatinine, Ser: 0.64 mg/dL (ref 0.44–1.00)
GFR calc Af Amer: >60 mL/min (ref >60)
GFR calc non Af Amer: >60 mL/min (ref >60)
GLUCOSE: 119 mg/dL — AB (ref 65–99)
POTASSIUM: 3.1 mmol/L — AB (ref 3.5–5.1)
Sodium: 135 mmol/L (ref 135–145)

## 2018-01-18 LAB — CULTURE, BLOOD (ROUTINE X 2)
Culture: NO GROWTH
Culture: NO GROWTH
Special Requests: ADEQUATE
Special Requests: ADEQUATE

## 2018-01-18 LAB — MAGNESIUM: Magnesium: 1.1 mg/dL — ABNORMAL LOW (ref 1.7–2.4)

## 2018-01-18 MED ORDER — LEVOFLOXACIN 750 MG PO TABS: 750.0000 mg | ORAL_TABLET | Freq: Every day | ORAL | 0 refills | Status: AC

## 2018-01-18 MED ORDER — RIVAROXABAN (XARELTO) VTE STARTER PACK (15 & 20 MG): 20.0000 mg | ORAL_TABLET | Freq: Every day | ORAL | 0 refills | Status: AC

## 2018-01-18 MED ORDER — PREDNISONE 20 MG PO TABS: ORAL_TABLET | ORAL | 0 refills | Status: DC

## 2018-01-18 MED ORDER — POTASSIUM CHLORIDE CRYS ER 20 MEQ PO TBCR
40.0000 meq | EXTENDED_RELEASE_TABLET | Freq: Once | ORAL | Status: AC
  Administered 2018-01-18: 40 meq via ORAL
  Filled 2018-01-18: qty 2

## 2018-01-18 MED ORDER — HEPARIN SOD (PORK) LOCK FLUSH 100 UNIT/ML IV SOLN
500.0000 [IU] | INTRAVENOUS | Status: AC | PRN
  Administered 2018-01-18: 500 [IU]
  Filled 2018-01-18: qty 5

## 2018-01-18 MED ORDER — METOPROLOL TARTRATE 75 MG PO TABS: 75.0000 mg | ORAL_TABLET | Freq: Two times a day (BID) | ORAL | 0 refills | Status: AC

## 2018-01-18 MED ORDER — ALBUTEROL SULFATE HFA 108 (90 BASE) MCG/ACT IN AERS: 2.0000 | INHALATION_SPRAY | Freq: Four times a day (QID) | RESPIRATORY_TRACT | 1 refills | Status: AC | PRN

## 2018-01-18 MED ORDER — ALBUTEROL SULFATE (2.5 MG/3ML) 0.083% IN NEBU: 2.5000 mg | INHALATION_SOLUTION | RESPIRATORY_TRACT | 1 refills | Status: AC | PRN

## 2018-01-18 MED ORDER — MAGNESIUM SULFATE 4 GM/100ML IV SOLN
4.0000 g | Freq: Once | INTRAVENOUS | Status: AC
  Administered 2018-01-18: 4 g via INTRAVENOUS
  Filled 2018-01-18: qty 100

## 2018-01-18 MED ORDER — PREDNISONE 20 MG PO TABS
20.0000 mg | ORAL_TABLET | Freq: Every day | ORAL | Status: DC
  Administered 2018-01-18: 20 mg via ORAL
  Filled 2018-01-18: qty 1

## 2018-01-18 NOTE — Care Management Note (Signed)
Case Management Note  Patient Details  Name: Sydney Blair MRN: [id number] Date of Birth: 1956-06-25  Subjective/Objective:   history of stage III squamous cell carcinoma of right lung status post chemo radiation from 03/21/2017 through 05/02/2017, left upper extremity DVT on Xarelto, COPD, depression, GERD, hypothyroidism, chronic respiratory failure. From home, ind with ADL's. Has home oxygen and neb machine. Has PCP, insurance with prescription coverage and transportation.                   Action/Plan: DC home today with RN and SW. Offered choice of Home health agencies. No preference. Juliann Pulse of [facility] notified and will obtain orders via Epic.   Expected Discharge Date:  01/18/18               Expected Discharge Plan:  Home/Self Care  In-House Referral:     Discharge planning Services  CM Consult  Post Acute Care Choice:  NA Choice offered to:  NA  DME Arranged:    DME Agency:     HH Arranged:   RN, SW HH Agency:   [facility]  Status of Service:  Completed, signed off  If discussed at [place] of Stay Meetings, dates discussed:    Additional Comments:  Kamarrion Stfort, Chauncey Reading, RN 01/18/2018, 11:31 AM

## 2018-01-18 NOTE — Discharge Summary (Signed)
Physician Discharge Summary  [name]:[id number] DOB: 12-Oct-1955 DOA: 01/13/2018  PCP: [name], MD Oncologist: Dr. Delton Coombes Cardiologist: Dr. Bronson Ing  Admit date: 01/13/2018 Discharge date: 01/18/2018  Admitted From: Home  Disposition: Home   PT IS [place] PROGRESSIVE NATURE OF HER LUNG CANCER but insisting to go home today.   Recommendations for Outpatient Follow-up:  1. Follow up with Oncologist in 1 weeks to discuss resuming chemotherapy 2. Follow up with PCP in 1-2 weeks 3. Please obtain BMP/CBC in 1-2 weeks 4. Please follow up on cytology results from recent thoracentesis.   Home Health: RN, SW  Discharge Condition: STABLE, but GUARDED  CODE STATUS: DNR  Brief Hospitalization Summary: Please see all hospital notes, images, labs for full details of the hospitalization.  HPI: Sydney Blair is a 62 y.o. female with a history of stage III squamous cell carcinoma of right lung status post chemo radiation from 03/21/2017 through 05/02/2017, left upper extremity DVT on Xarelto, COPD, depression, GERD, hypothyroidism, chronic respiratory failure.  Patient having worsening shortness of breath over the past 2 months, but worse most recently.  Dyspnea worse with exertion to 10 feet and improved with rest and nebulizer treatments.  Does have chronic cough (patient still smokes 1 pack/day), but feels that her cough is worse than normal.  No other palliating or provoking factors.  CT chest shows possible  Emergency Department Course: Right-sided pneumonia versus worsening cancer.  Also shows new pleural effusion.  White count elevated to 12  Brief Narrative:  62 year old female with a history of stage III squamous cell lung cancer, recent DVT, oxygen dependent COPD, hypothyroidism, presented to the hospital with worsening shortness of breath.  Imaging of chest indicated pleural effusion as well as possible pneumonia versus progression of  cancer.  She is been admitted for antibiotics, steroids and bronchodilators.  Plans are for thoracentesis when she is medically stable.  PCO2 started to trend up and she has been transferred to stepdown unit for BiPAP therapy.  She is somewhat lethargic at this time.  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 Acute on chronic respiratory failure with hypoxia/hypercapnia.  Clinically she is feeling a lot better.  She feels a lot better after the thoracentesis where 1.3 L of fluid was removed.  Fluid studies pending.  Patient is on chronic home oxygen.  She presents with worsening shortness of breath.  Worsening respiratory failure likely related to COPD/pneumonia/pleural effusion.  PCO2 started to trend up.  She was more somnolent and confused but that has improved considerably and she is back to her baseline mental status. Pt was treated with bipap and is feeling a lot better.  She is now back on nasal cannula.  She wants to go home.  She verbalizes that she understands the risks but would prefer to be at home.  I offered for her to remain in hospital longer but she declined.   2. Metabolic encephalopathy. Resolved.  She is back to her baseline mental status.  3. COPD exacerbation.  Improved with treatment. Lung sounds are much improved.  Weaning steroids, antibiotics and resume home bronchodilators. 4. Community-acquired pneumonia.  Haziness on CT chest possible pneumonia.  She is on antibiotics. 3 more  days of levaquin.   5. Stage III squamous cell lung cancer of right lung.  Concerns that haziness on CT might be progression of malignancy. She willl need to follow up with her oncologist.  Pt decided against staying in the hospital any longer.  She would prefer  to go home and be with family and friends.  I had the palliative medicine team to see her and she has decided she wants to continue chemotherapy treatments and will see her oncologist in next few days to discuss resuming chemotherapy. 6. Large malignant Pleural effusion.  S/p thoracentesis performed by I.R. with 1.3L fluid removed.  7. Leukocytosis - Suspect reaction to steroids, will follow.  Slowly trending down.   8. Hypokalemia.  Replacing magnesium IV, continue oral replacement.   9. Hypothyroidism.  Continue Synthroid.  10. Tobacco - Pt strongly advised to please stop using all tobacco products but unfortunately I suspect that she will resume smoking when goes home.  11. Sinus Tachycardia. Unfortunately I suspect this is secondary to progression of lung cancer and not very responsive to beta blockers, her dose of lopressor was increased to 75 mg BID.  She did not want to stay in hospital any longer to follow this.  She was advised that if it worsens to return or seek medical care with her oncologist or her cardiologist.   12. Recent diagnosis of left upper extremity DVT in 10/2017, on Xarelto for full anticoagulation which should be continued at home.    DVT prophylaxis: Xarelto Code Status: Full code Family Communication: Discussed with husband and 2 sisters Disposition Plan: Discharge home, HIGH RISK for readmission, pt declined palliative care, wants to continue chemotherapy  Antimicrobials:   Levofloxacin 5/24 >  Discharge Diagnoses:  Principal Problem:   Shortness of breath Active Problems:   COPD exacerbation (HCC)   Hypokalemia   Stage III squamous cell carcinoma of right lung ([facility])   Community acquired pneumonia   Hypothyroidism   Pleural effusion   Palliative care by specialist  Discharge Instructions: Discharge Instructions    Call MD for:  difficulty breathing, headache or visual disturbances   Complete by:  As directed    Call MD for:  extreme fatigue   Complete  by:  As directed    Call MD for:  persistant dizziness or light-headedness   Complete by:  As directed    Call MD for:  persistant nausea and vomiting   Complete by:  As directed    Call MD for:  severe uncontrolled pain   Complete by:  As directed    Increase activity slowly   Complete by:  As directed      Allergies as of 01/18/2018      Reactions   Penicillins Anaphylaxis, Other (See Comments)   Has patient had a PCN reaction causing immediate rash, facial/tongue/throat swelling, SOB or lightheadedness with hypotension:Yes Has patient had a PCN reaction causing severe rash involving mucus membranes or skin necrosis:Yes Has patient had a PCN reaction that required hospitalization:No Has patient had a PCN reaction occurring within the last 10 years:No If all of the above answers are "NO", then may proceed with Cephalosporin use.      Medication List    STOP taking these medications   hydrOXYzine 25 MG capsule Commonly known as:  VISTARIL   lidocaine-prilocaine cream Commonly known as:  EMLA     TAKE these medications   albuterol (2.5 MG/3ML) 0.083% nebulizer solution Commonly known as:  PROVENTIL Take 3 mLs (  2.5 mg total) by nebulization every 4 (four) hours as needed for wheezing or shortness of breath. What changed:  when to take this   albuterol 108 (90 Base) MCG/ACT inhaler Commonly known as:  PROVENTIL HFA;VENTOLIN HFA Inhale 2 puffs into the lungs every 6 (six) hours as needed for wheezing or shortness of breath. What changed:  Another medication with the same name was changed. Make sure you understand how and when to take each.   budesonide-formoterol 160-4.5 MCG/ACT inhaler Commonly known as:  SYMBICORT Inhale 2 puffs into the lungs daily.   dexlansoprazole 60 MG capsule Commonly known as:  DEXILANT Take 60 mg by mouth daily.   DULoxetine 60 MG capsule Commonly known as:  CYMBALTA Take 60 mg by mouth daily.   furosemide 20 MG tablet Commonly known as:   LASIX Take 20 mg by mouth daily. As needed   gabapentin 800 MG tablet Commonly known as:  NEURONTIN Take 800 mg by mouth 4 (four) times daily.   levofloxacin 750 MG tablet Commonly known as:  LEVAQUIN Take 1 tablet (750 mg total) by mouth daily for 3 days. Start taking on:  01/19/2018 What changed:    medication strength  how much to take   levothyroxine 100 MCG tablet Commonly known as:  SYNTHROID, LEVOTHROID Take 100 mcg by mouth daily before breakfast. Reported on 02/03/2016   meclizine 12.5 MG tablet Commonly known as:  ANTIVERT Take 25 mg by mouth daily.   methocarbamol 500 MG tablet Commonly known as:  ROBAXIN Take 500 mg by mouth every 6 (six) hours as needed for muscle spasms.   Metoprolol Tartrate 75 MG Tabs Take 75 mg by mouth 2 (two) times daily. What changed:    medication strength  how much to take   Misc. Devices Misc Please provide patient with a nebulizer machine   NUCYNTA ER 100 MG 12 hr tablet Generic drug:  tapentadol Take 100 mg by mouth every 12 (twelve) hours.   potassium chloride 10 MEQ tablet Commonly known as:  K-DUR Take 2 tablets (20 mEq total) by mouth daily.   predniSONE 20 MG tablet Commonly known as:  DELTASONE One tablet PO daily for 5 days, then half tablet daily for 5 days, then stop What changed:    medication strength  additional instructions   Rivaroxaban 15 & 20 MG Tbpk Take 20 mg by mouth daily. What changed:    how much to take  how to take this  when to take this  additional instructions   rosuvastatin 5 MG tablet Commonly known as:  CRESTOR Take 1 tablet (5 mg total) by mouth daily.   tiotropium 18 MCG inhalation capsule Commonly known as:  SPIRIVA Place 1 capsule (18 mcg total) into inhaler and inhale daily.      Follow-up Information    [name], MD. Schedule an appointment as soon as possible for a visit in 2 week(s).   Specialty:  Internal Medicine Contact information: [address] [place] [address] [phone]        Herminio Commons, MD. Schedule an appointment as soon as possible for a visit in 1 week(s).   Specialty:  Cardiology Why:  Hospital Follow up  Contact information: [place] [address] [phone]        Derek Jack, MD. Schedule an appointment as soon as possible for a visit in 5 day(s).   Specialty:  Hematology Why:  Hospital Follow Up  Contact information: [place]  [address] [place] [address] [phone]          Allergies  Allergen Reactions  . Penicillins Anaphylaxis and Other (See Comments)    Has patient had a PCN reaction causing immediate rash, facial/tongue/throat swelling, SOB or lightheadedness with hypotension:Yes Has patient had a PCN reaction causing severe rash involving mucus membranes or skin necrosis:Yes Has patient had a PCN reaction that required hospitalization:No Has patient had a PCN reaction occurring within the last 10 years:No If all of the above answers are "NO", then may proceed with Cephalosporin use.    Allergies as of 01/18/2018      Reactions   Penicillins Anaphylaxis, Other (See Comments)   Has patient had a PCN reaction causing immediate rash, facial/tongue/throat swelling, SOB or lightheadedness with hypotension:Yes Has patient had a PCN reaction causing severe rash involving mucus membranes or skin necrosis:Yes Has patient had a PCN reaction that required hospitalization:No Has patient had a PCN reaction occurring within the last 10 years:No If all of the above answers are "NO", then may proceed with Cephalosporin use.      Medication List    STOP taking these medications   hydrOXYzine 25 MG capsule Commonly known as:  VISTARIL   lidocaine-prilocaine cream Commonly known as:  EMLA     TAKE these medications   albuterol (2.5 MG/3ML) 0.083% nebulizer solution Commonly known as:  PROVENTIL Take 3 mLs (2.5 mg total) by nebulization every 4 (four) hours as  needed for wheezing or shortness of breath. What changed:  when to take this   albuterol 108 (90 Base) MCG/ACT inhaler Commonly known as:  PROVENTIL HFA;VENTOLIN HFA Inhale 2 puffs into the lungs every 6 (six) hours as needed for wheezing or shortness of breath. What changed:  Another medication with the same name was changed. Make sure you understand how and when to take each.   budesonide-formoterol 160-4.5 MCG/ACT inhaler Commonly known as:  SYMBICORT Inhale 2 puffs into the lungs daily.   dexlansoprazole 60 MG capsule Commonly known as:  DEXILANT Take 60 mg by mouth daily.   DULoxetine 60 MG capsule Commonly known as:  CYMBALTA Take 60 mg by mouth daily.   furosemide 20 MG tablet Commonly known as:  LASIX Take 20 mg by mouth daily. As needed   gabapentin 800 MG tablet Commonly known as:  NEURONTIN Take 800 mg by mouth 4 (four) times daily.   levofloxacin 750 MG tablet Commonly known as:  LEVAQUIN Take 1 tablet (750 mg total) by mouth daily for 3 days. Start taking on:  01/19/2018 What changed:    medication strength  how much to take   levothyroxine 100 MCG tablet Commonly known as:  SYNTHROID, LEVOTHROID Take 100 mcg by mouth daily before breakfast. Reported on 02/03/2016   meclizine 12.5 MG tablet Commonly known as:  ANTIVERT Take 25 mg by mouth daily.   methocarbamol 500 MG tablet Commonly known as:  ROBAXIN Take 500 mg by mouth every 6 (six) hours as needed for muscle spasms.   Metoprolol Tartrate 75 MG Tabs Take 75 mg by mouth 2 (two) times daily. What changed:    medication strength  how much to take   Misc. Devices Misc Please provide patient with a nebulizer machine   NUCYNTA ER 100 MG 12 hr tablet Generic drug:  tapentadol Take 100 mg by mouth every 12 (twelve) hours.   potassium chloride 10 MEQ tablet Commonly known as:  K-DUR Take 2 tablets (20 mEq total) by mouth daily.  predniSONE 20 MG tablet Commonly known as:  DELTASONE One  tablet PO daily for 5 days, then half tablet daily for 5 days, then stop What changed:    medication strength  additional instructions   Rivaroxaban 15 & 20 MG Tbpk Take 20 mg by mouth daily. What changed:    how much to take  how to take this  when to take this  additional instructions   rosuvastatin 5 MG tablet Commonly known as:  CRESTOR Take 1 tablet (5 mg total) by mouth daily.   tiotropium 18 MCG inhalation capsule Commonly known as:  SPIRIVA Place 1 capsule (18 mcg total) into inhaler and inhale daily.       Procedures/Studies: Dg Chest 1 View  Result Date: 01/17/2018 CLINICAL DATA:  Post right thoracentesis EXAM: CHEST  1 VIEW COMPARISON:  01/14/2018 FINDINGS: Decreasing right effusion following thoracentesis. No pneumothorax. Persistent right upper lobe perihilar opacity and right apical pleural thickening. Left Port-A-Cath remains in place, unchanged. Mild cardiomegaly. IMPRESSION: Decreasing right effusion following thoracentesis.  No pneumothorax. Electronically Signed   By: Rolm Baptise M.D.   On: 01/17/2018 14:37   Dg Chest 2 View  Result Date: 01/13/2018 CLINICAL DATA:  Shortness of breath. History of right lung carcinoma. History of breast carcinoma EXAM: CHEST - 2 VIEW COMPARISON:  PET-CT November 02, 2017 and chest radiograph November 25, 2017 FINDINGS: There is extensive scarring with volume loss in the right upper lobe. There is a stable area of apparent cavitation in the right upper lobe region. There is elevation of the right hemidiaphragm with blunting of the right costophrenic angle, likely due to scarring. Left lung is clear. Heart size is normal. There is distortion of pulmonary vascularity on the right. Pulmonary vascularity on the left is normal. No adenopathy is demonstrable by radiography. Port-A-Cath tip is in the superior vena cava. No pneumothorax. No evident bone lesions. IMPRESSION: Extensive scarring with volume loss right upper lobe. Milder scarring  noted right base. Chronic blunting of the right costophrenic angle noted. Stable area of cavitation right upper lobe posteriorly. These changes may be due to previous radiation therapy. No progression of cavitation in the right upper lobe. No new opacity. Left lung is clear. Stable cardiac silhouette. Port-A-Cath tip in superior vena cava. No evident pneumothorax. Electronically Signed   By: Lowella Grip III M.D.   On: 01/13/2018 15:51   Ct Head Wo Contrast  Result Date: 01/14/2018 CLINICAL DATA:  Altered mental status EXAM: CT HEAD WITHOUT CONTRAST TECHNIQUE: Contiguous axial images were obtained from the base of the skull through the vertex without intravenous contrast. COMPARISON:  06/20/2017 FINDINGS: Brain: There is no mass, hemorrhage or extra-axial collection. 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 The brain parenchyma is otherwise normal. The size and configuration of the ventricles and extra-axial CSF spaces are normal. Vascular: No hyperdense vessel or unexpected vascular calcification. Skull: Small osteoma near the right pterion. Sinuses/Orbits: No fluid levels or advanced mucosal thickening of the visualized paranasal sinuses. No mastoid or middle ear effusion. The orbits are normal. IMPRESSION: No acute abnormality. 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 Electronically Signed   By: Ulyses Jarred M.D.   On: 01/14/2018 22:47   Ct Angio Chest Pe W And/or Wo Contrast  Result Date: 01/13/2018 CLINICAL DATA:  Lung cancer, on chemotherapy. Worsening shortness of breath. EXAM: CT ANGIOGRAPHY CHEST WITH CONTRAST TECHNIQUE: Multidetector CT imaging of the chest was performed using the standard protocol during bolus administration of  intravenous contrast. Multiplanar CT image reconstructions and MIPs were obtained to evaluate the vascular anatomy. CONTRAST:  162mL ISOVUE-370 IOPAMIDOL (ISOVUE-370) INJECTION 76% COMPARISON:  PET-CT from  11/02/2017. FINDINGS: Cardiovascular: Mildly reduced sensitivity in assessing for pulmonary embolus due to motion artifact. No filling defect is identified in the pulmonary arterial tree to suggest pulmonary embolus. Reduction in size of elements of the right pulmonary arterial tree likely attributable to hypoventilation related vaso constriction. Aortoiliac atherosclerotic vascular disease. Mild right heart enlargement. Mediastinum/Nodes: Progressive infiltration in density in the mediastinal adipose tissues could be from infiltrative tumor, mediastinal edema, or mediastinitis. The mild adenopathy shown on the prior exam is still present but is indistinctly marginated. Distended right jugular vein. No pericardial effusion. Lungs/Pleura: Progressive loculated pleural effusion on the right side; malignant effusion or empyema not excluded. Cavitary lesion in the right mid lung is probably in the upper lobe and contains an air-fluid level, measuring about 4.6 cm transverse, roughly similar to the prior exam. This could be a cavitary mass or cavitary pneumonia, the persistence favors the former. Increased right perihilar airspace opacity. Right paramediastinal airspace opacity and/or loculated fluid is increased. Paraseptal and centrilobular emphysema. Upper Abdomen: Extensive new ascites. Chronic left hydronephrosis. Aortoiliac atherosclerotic vascular disease. Musculoskeletal: Old right rib fractures, some nonunited, stable. Thoracic spondylosis. Review of the MIP images confirms the above findings. IMPRESSION: 1. No acute pulmonary embolus identified. 2. Notable and increased right perihilar and right paramediastinal airspace opacity, query tumor, pneumonia, or radiation pneumonitis. 3. Current moderate to large loculated right pleural effusion is new compared to 11/02/2017. Malignant effusion or empyema not excluded. There is also extensive new ascites and fluid infiltration of the mediastinum obscuring fatty  tissues, which may be from third spacing of fluid, mediastinitis, or infiltrative tumor. No pericardial effusion. 4. Mild right heart enlargement. 5. Cavitary mass or cavitary pneumonia in the right upper lobe, essentially stable from June (which favors a mass). 6. Aortic Atherosclerosis (ICD10-I70.0) and Emphysema (ICD10-J43.9). 7. Chronic left hydronephrosis. Electronically Signed   By: Van Clines M.D.   On: 01/13/2018 19:20   Dg Chest Port 1 View  Result Date: 01/14/2018 CLINICAL DATA:  Respiratory failure EXAM: PORTABLE CHEST 1 VIEW COMPARISON:  Chest radiograph and chest CT Jan 13, 2018 FINDINGS: There is partially loculated effusion on the right with areas of consolidation in the right perihilar and lower lobe regions. The area cavitation noted on CT 1 day prior is not appreciable on this portable chest radiographic examination. There is atelectatic change in the left perihilar region. No frank consolidation is noted on the left. Note that a small portion of the left base is not seen. Heart is upper normal in size with pulmonary vascularity within normal limits. There is aortic atherosclerosis. Port-A-Cath tip is in the superior vena cava. There is apparent adenopathy in the right paratracheal and right hilar regions. IMPRESSION: Extensive consolidation in portions of the right lung, most notably in the right perihilar and lower lobe regions. Partially loculated effusions seen throughout much the right hemithorax. There is adenopathy in the right hilar and paratracheal regions. Note that area cavitation noted on CT 1 day prior is not appreciable by frontal portable radiography. There is perihilar atelectasis on the left. Heart size normal. There is aortic atherosclerosis. Port-A-Cath tip in superior vena cava. No pneumothorax. Aortic Atherosclerosis (ICD10-I70.0). Electronically Signed   By: Lowella Grip III M.D.   On: 01/14/2018 17:46   US Thoracentesis Asp Pleural Space W/img  Guide  Result Date: 01/17/2018 INDICATION:  Right pleural effusion EXAM: ULTRASOUND GUIDED RIGHT THORACENTESIS MEDICATIONS: None. COMPLICATIONS: None immediate. PROCEDURE: An ultrasound guided thoracentesis was thoroughly discussed with the patient and questions answered. The benefits, risks, alternatives and complications were also discussed. The patient understands and wishes to proceed with the procedure. Written consent was obtained. Ultrasound was performed to localize and mark an adequate pocket of fluid in the right posterior chest. The area was then prepped and draped in the normal sterile fashion. 1% Lidocaine was used for local anesthesia. Under ultrasound guidance a 19 gauge, 7-cm, Yueh catheter was introduced. Thoracentesis was performed. The catheter was removed and a dressing applied. FINDINGS: A total of approximately 1.3 liters of yellow fluid was removed. Samples were sent to the laboratory as requested by the clinical team. IMPRESSION: Successful ultrasound guided right thoracentesis yielding 1.3 liters of pleural fluid. Electronically Signed   By: Rolm Baptise M.D.   On: 01/17/2018 14:37      Subjective: Pt says that she wants to go home, she says she feels much better after the thoracentesis and does not want to be in the hospital any longer. I asked if she would stay longer but she declined.   Discharge Exam: Vitals:   01/18/18 0900 01/18/18 1000  BP: (!) 111/99 111/75  Pulse: (!) 137 (!) 139  Resp:    Temp:    SpO2: 93% 94%   Vitals:   01/18/18 0600 01/18/18 0749 01/18/18 0900 01/18/18 1000  BP: 134/72  (!) 111/99 111/75  Pulse: (!) 137  (!) 137 (!) 139  Resp:      Temp:  (!) 97.4 F (36.3 C)    TempSrc:  Axillary    SpO2: 94%  93% 94%  Weight:      Height:        General exam: awake, alert, oriented x 3.  Sitting up in chair.  NAD.  Respiratory system: improved wheezing, no rales. Respiratory effort normal. Cardiovascular system: S1 & S2 heard, tachycardic. No  JVD, murmurs, rubs, gallops or clicks. No pedal edema. Gastrointestinal system: Abdomen is nondistended, soft and nontender. No organomegaly or masses felt. Normal bowel sounds heard. Central nervous system: No focal neurological deficits. Extremities: Symmetric 5 x 5 power. Skin: No rashes, lesions or ulcers Psychiatry: normal affect.   The results of significant diagnostics from this hospitalization (including imaging, microbiology, ancillary and laboratory) are listed below for reference.     Microbiology: Recent Results (from the past 240 hour(s))  Blood Culture (routine x 2)     Status: None   Collection Time: 01/13/18  4:51 PM  Result Value Ref Range Status   Specimen Description BLOOD LEFT HAND  Final   Special Requests   Final    BOTTLES DRAWN AEROBIC AND ANAEROBIC Blood Culture adequate volume   Culture   Final    NO GROWTH 5 DAYS Performed at [facility], [address] [place], [place] [address]    Report Status 01/18/2018 FINAL  Final  Blood Culture (routine x 2)     Status: None   Collection Time: 01/13/18  4:51 PM  Result Value Ref Range Status   Specimen Description BLOOD RIGHT HAND  Final   Special Requests   Final    BOTTLES DRAWN AEROBIC ONLY Blood Culture adequate volume   Culture   Final    NO GROWTH 5 DAYS Performed at [facility], [address], [place], [place] [address]    Report Status 01/18/2018 FINAL  Final  MRSA PCR Screening  Status: None   Collection Time: 01/14/18  6:17 PM  Result Value Ref Range Status   MRSA by PCR NEGATIVE NEGATIVE Final    Comment:        The GeneXpert MRSA Assay (FDA approved for NASAL specimens only), is one component of a comprehensive MRSA colonization surveillance program. It is not intended to diagnose MRSA infection nor to guide or monitor treatment for MRSA infections. Performed at [facility], [address] [place], [place] [address]   Culture, body fluid-bottle     Status: None (Preliminary result)    Collection Time: 01/17/18  1:32 PM  Result Value Ref Range Status   Specimen Description PLEURAL  Final   Special Requests BOTTLES DRAWN AEROBIC AND ANAEROBIC 10 CC EACH  Final   Culture   Final    NO GROWTH < 24 HOURS Performed at [facility], [address], [place], [place] [address]    Report Status PENDING  Incomplete  Gram stain     Status: None   Collection Time: 01/17/18  1:33 PM  Result Value Ref Range Status   Specimen Description PLEURAL  Final   Special Requests NONE  Final   Gram Stain   Final    NO ORGANISMS SEEN CYTOSPIN SMEAR WBC PRESENT,BOTH PMN AND MONONUCLEAR Performed at [facility], [address], [place], [place] [address]    Report Status 01/17/2018 FINAL  Final     Labs: BNP (last 3 results) No results for input(s): BNP in the last 8760 hours. Basic Metabolic Panel: Recent Labs  Lab 01/14/18 0628 01/15/18 0438 01/16/18 0406 01/17/18 0548 01/18/18 0656  NA 138 138 140 135 135  K 3.2* 4.8 4.9 5.2* 3.1*  CL 98* 102 104 101 94*  CO2 31 30 32 29 35*  GLUCOSE 116* 113* 116* 80 119*  BUN 15 15 16 20 18   CREATININE 0.74 0.67 0.73 0.73 0.64  CALCIUM 8.3* 8.5* 8.6* 8.1* 8.1*  MG  --   --   --   --  1.1*   Liver Function Tests: Recent Labs  Lab 01/13/18 1608 01/16/18 0406  AST 856* 87*  ALT 546* 214*  ALKPHOS 98 80  BILITOT 1.7* 1.2  PROT 6.6 6.2*  ALBUMIN 3.1* 2.8*   No results for input(s): LIPASE, AMYLASE in the last 168 hours. Recent Labs  Lab 01/15/18 0527  AMMONIA 23   CBC: Recent Labs  Lab 01/13/18 1608 01/14/18 0628 01/15/18 0438 01/16/18 0406 01/17/18 0548 01/18/18 0656  WBC 12.3* 10.9* 11.1* 20.3* 18.0* 14.1*  NEUTROABS 10.5*  --   --  18.2*  --  11.8*  HGB 12.9 12.9 12.2 12.2 12.5 12.6  HCT 42.2 43.1 42.1 42.6 43.9 43.1  MCV 80.7 82.1 84.9 85.7 86.1 83.9  PLT 196 160 135* 143* 127* 104*   Cardiac Enzymes: No results for input(s): CKTOTAL, CKMB, CKMBINDEX, TROPONINI in the last 168 hours. BNP: Invalid  input(s): POCBNP CBG: No results for input(s): GLUCAP in the last 168 hours. D-Dimer No results for input(s): DDIMER in the last 72 hours. Hgb A1c No results for input(s): HGBA1C in the last 72 hours. Lipid Profile No results for input(s): CHOL, HDL, LDLCALC, TRIG, CHOLHDL, LDLDIRECT in the last 72 hours. Thyroid function studies No results for input(s): TSH, T4TOTAL, T3FREE, THYROIDAB in the last 72 hours.  Invalid input(s): FREET3 Anemia work up No results for input(s): VITAMINB12, FOLATE, FERRITIN, TIBC, IRON, RETICCTPCT in the last 72 hours. Urinalysis    Component Value Date/Time  COLORURINE YELLOW 01/13/2018 2015   APPEARANCEUR CLEAR 01/13/2018 2015   LABSPEC 1.012 01/13/2018 2015   PHURINE 6.0 01/13/2018 2015   GLUCOSEU NEGATIVE 01/13/2018 2015   HGBUR NEGATIVE 01/13/2018 2015   HGBUR negative 02/26/2008 1027   BILIRUBINUR NEGATIVE 01/13/2018 2015   KETONESUR NEGATIVE 01/13/2018 2015   PROTEINUR NEGATIVE 01/13/2018 2015   UROBILINOGEN 0.2 06/20/2012 1231   NITRITE NEGATIVE 01/13/2018 2015   LEUKOCYTESUR NEGATIVE 01/13/2018 2015   Sepsis Labs Invalid input(s): PROCALCITONIN,  WBC,  LACTICIDVEN Microbiology Recent Results (from the past 240 hour(s))  Blood Culture (routine x 2)     Status: None   Collection Time: 01/13/18  4:51 PM  Result Value Ref Range Status   Specimen Description BLOOD LEFT HAND  Final   Special Requests   Final    BOTTLES DRAWN AEROBIC AND ANAEROBIC Blood Culture adequate volume   Culture   Final    NO GROWTH 5 DAYS Performed at [facility], [address], [place], [place] [address]    Report Status 01/18/2018 FINAL  Final  Blood Culture (routine x 2)     Status: None   Collection Time: 01/13/18  4:51 PM  Result Value Ref Range Status   Specimen Description BLOOD RIGHT HAND  Final   Special Requests   Final    BOTTLES DRAWN AEROBIC ONLY Blood Culture adequate volume   Culture   Final    NO GROWTH 5 DAYS Performed at [facility], [address] [place], [place] [address]    Report Status 01/18/2018 FINAL  Final  MRSA PCR Screening     Status: None   Collection Time: 01/14/18  6:17 PM  Result Value Ref Range Status   MRSA by PCR NEGATIVE NEGATIVE Final    Comment:        The GeneXpert MRSA Assay (FDA approved for NASAL specimens only), is one component of a comprehensive MRSA colonization surveillance program. It is not intended to diagnose MRSA infection nor to guide or monitor treatment for MRSA infections. Performed at [facility], [address] [place], [place] [address]   Culture, body fluid-bottle     Status: None (Preliminary result)   Collection Time: 01/17/18  1:32 PM  Result Value Ref Range Status   Specimen Description PLEURAL  Final   Special Requests BOTTLES DRAWN AEROBIC AND ANAEROBIC 10 CC EACH  Final   Culture   Final    NO GROWTH < 24 HOURS Performed at [facility], [address] [place], [place] [address]    Report Status PENDING  Incomplete  Gram stain     Status: None   Collection Time: 01/17/18  1:33 PM  Result Value Ref Range Status   Specimen Description PLEURAL  Final   Special Requests NONE  Final   Gram Stain   Final    NO ORGANISMS SEEN CYTOSPIN SMEAR WBC PRESENT,BOTH PMN AND MONONUCLEAR Performed at [facility], [address] [place], [place] [address]    Report Status 01/17/2018 FINAL  Final    Time coordinating discharge: 40 minutes  SIGNED:  Irwin Brakeman, MD  [facility] 01/18/2018, 10:46 AM Pager [phone]  If 7PM-7AM, please contact night-coverage [email or web address] Password [email or web address]

## 2018-01-18 NOTE — Progress Notes (Signed)
Palliative: Mrs. Howeth is resting quietly in bed.  She greets me making and briefly keeping eye contact.  Present today at bedside is her Ermalinda Memos.  We talked about thoracentesis yesterday, 1.3 L removed.  Mrs. Hollett states that she felt much better about her breathing, and is willing to have thoracentesis again as needed.  We talked about possibilities of Pleurx drain in the future if needed.  I share that it would not be surprising if her fluid were to reaccumulate. Mrs. Baumert states that she expects to see the oncologist today, but will make an appointment for an outpatient visit if she does not.  I encouraged her to follow-up with oncology for "what is next". We talked about CODE STATUS.  Sister [name] endorses to treat the treatable but no CPR, no intubation.  Mrs. Oland is at first confused and states that she would want [place] to press on her chest to try to restart at her heart, she later states that she would not want this.  As we are finishing our conversation her husband [place] arrives.  We also talked about CODE STATUS and the concept of "treat the treatable" but no extraordinary measures.  I share that with today's medicine, we can usually give medications, check labs, x-rays in an attempt to improve health.  If these measures are not working, and she continues to get sicker, the medical team recommends no extraordinary measures.  I share that CPR and intubation will not change her cancer, her COPD, her functional status. I encouraged family to consider these choices. "Hard choices" booklet in [place] and Spanish given.  50 minutes Quinn Axe, NP Palliative Medicine Team Team Phone # [phone]

## 2018-01-18 NOTE — Progress Notes (Signed)
AVS paperwork reviewed with pt. Voices no further questions or concerns. Left chest port d/c without difficulty. Tolerated well. Secured with 2x2 and Mepilex. Foley removed. Pt tolerated well. This nurse paged Dr. Wynetta Emery regarding pt's O2 sats of 90% on 3 L. Noted that Dr. Durenda Age note offered for pt to remain in hospital but that pt prefers to go home. Pt is aware of risk and has discussed with MD. Pt escorted via w/c to private vehicle.

## 2018-01-18 NOTE — Discharge Instructions (Signed)
Seek Medical Care or Return to Emergency Room if symptoms come back or get worse or new problem develops.    Acute Respiratory Failure, Adult Acute respiratory failure occurs when there is not enough oxygen passing from your lungs to your body. When this happens, your lungs have trouble removing carbon dioxide from the blood. This causes your blood oxygen level to drop too low as carbon dioxide builds up. Acute respiratory failure is a medical emergency. It can develop quickly, but it is temporary if treated promptly. Your lung capacity, or how much air your lungs can hold, may improve with time, exercise, and treatment. What are the causes? There are many possible causes of acute respiratory failure, including:  Lung injury.  Chest injury or damage to the ribs or tissues near the lungs.  Lung conditions that affect the flow of air and blood into and out of the lungs, such as pneumonia, acute respiratory distress syndrome, and cystic fibrosis.  Medical conditions, such as strokes or spinal cord injuries, that affect the muscles and nerves that control breathing.  Blood infection (sepsis).  Inflammation of the pancreas (pancreatitis).  A blood clot in the lungs (pulmonary embolism).  A large-volume blood transfusion.  Burns.  Near-drowning.  Seizure.  Smoke inhalation.  Reaction to medicines.  Alcohol or drug overdose.  What increases the risk? This condition is more likely to develop in people who have:  A blocked airway.  Asthma.  A condition or disease that damages or weakens the muscles, nerves, bones, or tissues that are involved in breathing.  A serious infection.  A health problem that blocks the unconscious reflex that is involved in breathing, such as hypothyroidism or sleep apnea.  A lung injury or trauma.  What are the signs or symptoms? Trouble breathing is the main symptom of acute respiratory failure. Symptoms may also include:  Rapid  breathing.  Restlessness or anxiety.  Skin, lips, or fingernails that appear blue (cyanosis).  Rapid heart rate.  Abnormal heart rhythms (arrhythmias).  Confusion or changes in behavior.  Tiredness or loss of energy.  Feeling sleepy or having a loss of consciousness.  How is this diagnosed? Your health care provider can diagnose acute respiratory failure with a medical history and physical exam. During the exam, your health care provider will listen to your heart and check for crackling or wheezing sounds in your lungs. Your may also have tests to confirm the diagnosis and determine what is causing respiratory failure. These tests may include:  Measuring the amount of oxygen in your blood (pulse oximetry). The measurement comes from a small device that is placed on your finger, earlobe, or toe.  Other blood tests to measure blood gases and to look for signs of infection.  Sampling your cerebral spinal fluid or tracheal fluid to check for infections.  Chest X-ray to look for fluid in spaces that should be filled with air.  Electrocardiogram (ECG) to look at the heart's electrical activity.  How is this treated? Treatment for this condition usually takes places in a hospital intensive care unit (ICU). Treatment depends on what is causing the condition. It may include one or more treatments until your symptoms improve. Treatment may include:  Supplemental oxygen. Extra oxygen is given through a tube in the nose, a face mask, or a hood.  A device such as a continuous positive airway pressure (CPAP) or bi-level positive airway pressure (BiPAP or BPAP) machine. This treatment uses mild air pressure to keep the airways open. A  mask or other device will be placed over your nose or mouth. A tube that is connected to a motor will deliver oxygen through the mask.  Ventilator. This treatment helps move air into and out of the lungs. This may be done with a bag and mask or a machine. For this  treatment, a tube is placed in your windpipe (trachea) so air and oxygen can flow to the lungs.  Extracorporeal membrane oxygenation (ECMO). This treatment temporarily takes over the function of the heart and lungs, supplying oxygen and removing carbon dioxide. ECMO gives the lungs a chance to recover. It may be used if a ventilator is not effective.  Tracheostomy. This is a procedure that creates a hole in the neck to insert a breathing tube.  Receiving fluids and medicines.  Rocking the bed to help breathing.  Follow these instructions at home:  Take over-the-counter and prescription medicines only as told by your health care provider.  Return to normal activities as told by your health care provider. Ask your health care provider what activities are safe for you.  Keep all follow-up visits as told by your health care provider. This is important. How is this prevented? Treating infections and medical conditions that may lead to acute respiratory failure can help prevent the condition from developing. Contact a health care provider if:  You have a fever.  Your symptoms do not improve or they get worse. Get help right away if:  You are having trouble breathing.  You lose consciousness.  Your have cyanosis or turn blue.  You develop a rapid heart rate.  You are confused. These symptoms may represent a serious problem that is an emergency. Do not wait to see if the symptoms will go away. Get medical help right away. Call your local emergency services (911 in the [place] Do not drive yourself to the hospital. This information is not intended to replace advice given to you by your health care provider. Make sure you discuss any questions you have with your health care provider. Document Released: 08/14/2013 Document Revised: 03/06/2016 Document Reviewed: 02/25/2016 Elsevier Interactive Patient Education  2018 [employer].   Pleural Effusion A pleural effusion is an abnormal  buildup of fluid in the layers of tissue between your lungs and the inside of your chest (pleural space). These two layers of tissue that line both your lungs and the inside of your chest are called pleura. Usually, there is no air in the space between the pleura, only a thin layer of fluid. If left untreated, a large amount of fluid can build up and cause the lung to collapse. A pleural effusion is usually caused by another disease that requires treatment. The two main types of pleural effusion are:  Transudative pleural effusion. This happens when fluid leaks into the pleural space because of a low protein count in your blood or high blood pressure in your vessels. Heart failure often causes this.  Exudative infusion. This occurs when fluid collects in the pleural space from blocked blood vessels or lymph vessels. Some lung diseases, injuries, and cancers can cause this type of effusion.  What are the causes? Pleural effusion can be caused by:  Heart failure.  A blood clot in the lung (pulmonary embolism).  Pneumonia.  Cancer.  Liver failure (cirrhosis).  Kidney disease.  Complications from surgery, such as from open heart surgery.  What are the signs or symptoms? In some cases, pleural effusion may cause no symptoms. Symptoms can include:  Shortness  of breath, especially when lying down.  Chest pain, often worse when taking a deep breath.  Fever.  Dry cough that is lasting (chronic).  Hiccups.  Rapid breathing.  An underlying condition that is causing the pleural effusion (such as heart failure, pneumonia, blood clots, tuberculosis, or cancer) may also cause additional symptoms. How is this diagnosed? Your health care provider may suspect pleural effusion based on your symptoms and medical history. Your health care provider will also do a physical exam and a chest X-ray. If the X-ray shows there is fluid in your chest, you may need to have this fluid removed using a needle  (thoracentesis) so it can be tested. You may also have:  Imaging studies of the chest, such as: ? Ultrasound. ? CT scan.  Blood tests for kidney and liver function.  How is this treated? Treatment depends on the cause of the pleural effusion. Treatment may include:  Taking antibiotic medicines to clear up an infection that is causing the pleural effusion.  Placing a tube in the chest to drain the effusion (tube thoracostomy). This procedure is often used when there is an infection in the fluid.  Surgery to remove the fibrous outer layer of tissue from the pleural space (decortication).  Thoracentesis, which can improve cough and shortness of breath.  A procedure to put medicine into the chest cavity to seal the pleural space to prevent fluid buildup (pleurodesis).  Chemotherapy and radiation therapy. These may be required in the case of cancerous (malignant) pleural effusion.  Follow these instructions at home:  Take medicines only as directed by your health care provider.  Keep track of how long you can gently exercise before you get short of breath. Try simply walking at first.  Do not use any tobacco products, including cigarettes, chewing tobacco, or electronic cigarettes. If you need help quitting, ask your health care provider.  Keep all follow-up visits as directed by your health care provider. This is important. Contact a health care provider if:  The amount of time that you are able to exercise decreases or does not improve with time.  You have pain or signs of infection at the puncture site if you had thoracentesis. Watch for: ? Drainage. ? Redness. ? Swelling.  You have a fever. Get help right away if:  You are short of breath.  You develop chest pain.  You develop a new cough. This information is not intended to replace advice given to you by your health care provider. Make sure you discuss any questions you have with your health care provider. Document  Released: 08/09/2005 Document Revised: 01/12/2016 Document Reviewed: 01/02/2014 Elsevier Interactive Patient Education  2018 [employer].  Thoracentesis, Care After Refer to this sheet in the next few weeks. These instructions provide you with information about caring for yourself after your procedure. Your health care provider may also give you more specific instructions. Your treatment has been planned according to current medical practices, but problems sometimes occur. Call your health care provider if you have any problems or questions after your procedure. What can I expect after the procedure? After your procedure, it is common to have pain at the puncture site. Follow these instructions at home:  Take medicines only as directed by your health care provider.  You may return to your normal diet and normal activities as directed by your health care provider.  Drink enough fluid to keep your urine clear or pale yellow.  Do not take baths, swim, or use  a hot tub until your health care provider approves.  Follow your health care provider's instructions about: ? Puncture site care. ? Bandage (dressing) changes and removal.  Check your puncture site every day for signs of infection. Watch for: ? Redness, swelling, or pain. ? Fluid, blood, or pus.  Keep all follow-up visits as directed by your health care provider. This is important. Contact a health care provider if:  You have redness, swelling, or pain at your puncture site.  You have fluid, blood, or pus coming from your puncture site.  You have a fever.  You have chills.  You have nausea or vomiting.  You have trouble breathing.  You develop a worsening cough. Get help right away if:  You have extreme shortness of breath.  You develop chest pain.  You faint or feel light-headed. This information is not intended to replace advice given to you by your health care provider. Make sure you discuss any questions you  have with your health care provider. Document Released: 08/30/2014 Document Revised: 04/10/2016 Document Reviewed: 05/21/2014 Elsevier Interactive Patient Education  2018 [place] of Breath, Adult Shortness of breath means you have trouble breathing. Your lungs are organs for breathing. Follow these instructions at home: Pay attention to any changes in your symptoms. Take these actions to help with your condition:  Do not smoke. Smoking can cause shortness of breath. If you need help to quit smoking, ask your doctor.  Avoid things that can make it harder to breathe, such as: ? Mold. ? Dust. ? Air pollution. ? Chemical smells. ? Things that can cause allergy symptoms (allergens), if you have allergies.  Keep your living space clean and free of mold and dust.  Rest as needed. Slowly return to your usual activities.  Take over-the-counter and prescription medicines, including oxygen and inhaled medicines, only as told by your doctor.  Keep all follow-up visits as told by your doctor. This is important.  Contact a doctor if:  Your condition does not get better as soon as expected.  You have a hard time doing your normal activities, even after you rest.  You have new symptoms. Get help right away if:  You have trouble breathing when you are resting.  You feel light-headed or you faint.  You have a cough that is not helped by medicines.  You cough up blood.  You have pain with breathing.  You have pain in your chest, arms, shoulders, or belly (abdomen).  You have a fever.  You cannot walk up stairs.  You cannot exercise the way you normally do. This information is not intended to replace advice given to you by your health care provider. Make sure you discuss any questions you have with your health care provider. Document Released: 01/26/2008 Document Revised: 08/26/2016 Document Reviewed: 08/26/2016 Elsevier Interactive Patient Education  2017  [employer]    Sinus Tachycardia Sinus tachycardia is a kind of fast heartbeat. In sinus tachycardia, the heart beats more than 100 times a minute. Sinus tachycardia starts in a part of the heart called the sinus node. Sinus tachycardia may be harmless, or it may be a sign of a serious condition. What are the causes? This condition may be caused by:  Exercise or exertion.  A fever.  Pain.  Loss of body fluids (dehydration).  Severe bleeding (hemorrhage).  Anxiety and stress.  Certain substances, including: ? Alcohol. ? Caffeine. ? Tobacco and nicotine products. ? Diet pills. ? Illegal drugs.  Medical conditions including: ? Heart disease. ? An infection. ? An overactive thyroid (hyperthyroidism). ? A lack of red blood cells (anemia).  What are the signs or symptoms? Symptoms of this condition include:  A feeling that the heart is beating quickly (palpitations).  Suddenly noticing your heartbeat (cardiac awareness).  Dizziness.  Tiredness (fatigue).  Shortness of breath.  Chest pain.  Nausea.  Fainting.  How is this diagnosed? This condition is diagnosed with:  A physical exam.  Other tests, such as: ? Blood tests. ? An electrocardiogram (ECG). This test measures the electrical activity of the heart. ? Holter monitoring. For this test, you wear a device that records your heartbeat for one or more days.  You may be referred to a heart specialist (cardiologist). How is this treated? Treatment for this condition depends on the cause or underlying condition. Treatment may involve:  Treating the underlying condition.  Taking new medicines or changing your current medicines as told by your health care provider.  Making changes to your diet or lifestyle.  Practicing relaxation methods.  Follow these instructions at home: Lifestyle  Do not use any products that contain nicotine or tobacco, such as cigarettes and e-cigarettes. If you need help  quitting, ask your health care provider.  Learn relaxation methods, like deep breathing, to help you when you get stressed or anxious.  Do not use illegal drugs, such as cocaine.  Do not abuse alcohol. Limit alcohol intake to no more than 1 drink a day for non-pregnant women and 2 drinks a day for men. One drink equals 12 oz of beer, 5 oz of wine, or 1 oz of hard liquor.  Find time to rest and relax often. This reduces stress.  Avoid: ? Caffeine. ? Stimulants such as over-the-counter diet pills or pills that help you to stay awake. ? Situations that cause anxiety or stress. General instructions  Drink enough fluids to keep your urine clear or pale yellow.  Take over-the-counter and prescription medicines only as told by your health care provider.  Keep all follow-up visits as told by your health care provider. This is important. Contact a health care provider if:  You have a fever.  You have vomiting or diarrhea that keeps happening (is persistent). Get help right away if:  You have pain in your chest, upper arms, jaw, or neck.  You become weak or dizzy.  You feel faint.  You have palpitations that do not go away. This information is not intended to replace advice given to you by your health care provider. Make sure you discuss any questions you have with your health care provider. Document Released: 09/16/2004 Document Revised: 03/06/2016 Document Reviewed: 02/21/2015 Elsevier Interactive Patient Education  2018 [employer].   Steps to Quit Smoking Smoking tobacco can be harmful to your health and can affect almost every organ in your body. Smoking puts you, and those around you, at risk for developing many serious chronic diseases. Quitting smoking is difficult, but it is one of the best things that you can do for your health. It is never too late to quit. What are the benefits of quitting smoking? When you quit smoking, you lower your risk of developing serious  diseases and conditions, such as:  Lung cancer or lung disease, such as COPD.  Heart disease.  Stroke.  Heart attack.  Infertility.  Osteoporosis and bone fractures.  Additionally, symptoms such as coughing, wheezing, and shortness of breath may get better when you quit. You may also  find that you get sick less often because your body is stronger at fighting off colds and infections. If you are pregnant, quitting smoking can help to reduce your chances of having a baby of low birth weight. How do I get ready to quit? When you decide to quit smoking, create a plan to make sure that you are successful. Before you quit:  Pick a date to quit. Set a date within the next two weeks to give you time to prepare.  Write down the reasons why you are quitting. Keep this list in places where you will see it often, such as on your bathroom mirror or in your car or wallet.  Identify the people, places, things, and activities that make you want to smoke (triggers) and avoid them. Make sure to take these actions: ? Throw away all cigarettes at home, at work, and in your car. ? Throw away smoking accessories, such as Scientist, research (medical). ? Clean your car and make sure to empty the ashtray. ? Clean your home, including curtains and carpets.  Tell your family, friends, and coworkers that you are quitting. Support from your loved ones can make quitting easier.  Talk with your health care provider about your options for quitting smoking.  Find out what treatment options are covered by your health insurance.  What strategies can I use to quit smoking? Talk with your healthcare provider about different strategies to quit smoking. Some strategies include:  Quitting smoking altogether instead of gradually lessening how much you smoke over a period of time. Research shows that quitting cold [place] is more successful than gradually quitting.  Attending in-person counseling to help you build  problem-solving skills. You are more likely to have success in quitting if you attend several counseling sessions. Even short sessions of 10 minutes can be effective.  Finding resources and support systems that can help you to quit smoking and remain smoke-free after you quit. These resources are most helpful when you use them often. They can include: ? Online chats with a Social worker. ? Telephone quitlines. ? Careers information officer. ? Support groups or group counseling. ? Text messaging programs. ? Mobile phone applications.  Taking medicines to help you quit smoking. (If you are pregnant or breastfeeding, talk with your health care provider first.) Some medicines contain nicotine and some do not. Both types of medicines help with cravings, but the medicines that include nicotine help to relieve withdrawal symptoms. Your health care provider may recommend: ? Nicotine patches, gum, or lozenges. ? Nicotine inhalers or sprays. ? Non-nicotine medicine that is taken by mouth.  Talk with your health care provider about combining strategies, such as taking medicines while you are also receiving in-person counseling. Using these two strategies together makes you more likely to succeed in quitting than if you used either strategy on its own. If you are pregnant or breastfeeding, talk with your health care provider about finding counseling or other support strategies to quit smoking. Do not take medicine to help you quit smoking unless told to do so by your health care provider. What things can I do to make it easier to quit? Quitting smoking might feel overwhelming at first, but there is a lot that you can do to make it easier. Take these important actions:  Reach out to your family and friends and ask that they support and encourage you during this time. Call telephone quitlines, reach out to support groups, or work with a counselor for support.  Ask  people who smoke to avoid smoking around  you.  Avoid places that trigger you to smoke, such as bars, parties, or smoke-break areas at work.  Spend time around people who do not smoke.  Lessen stress in your life, because stress can be a smoking trigger for some people. To lessen stress, try: ? Exercising regularly. ? Deep-breathing exercises. ? Yoga. ? Meditating. ? Performing a body scan. This involves closing your eyes, scanning your body from head to toe, and noticing which parts of your body are particularly tense. Purposefully relax the muscles in those areas.  Download or purchase mobile phone or tablet apps (applications) that can help you stick to your quit plan by providing reminders, tips, and encouragement. There are many free apps, such as QuitGuide from the [employer] Office manager for Disease Control and Prevention). You can find other support for quitting smoking (smoking cessation) through smokefree.gov and other websites.  How will I feel when I quit smoking? Within the first 24 hours of quitting smoking, you may start to feel some withdrawal symptoms. These symptoms are usually most noticeable 2-3 days after quitting, but they usually do not last beyond 2-3 weeks. Changes or symptoms that you might experience include:  Mood swings.  Restlessness, anxiety, or irritation.  Difficulty concentrating.  Dizziness.  Strong cravings for sugary foods in addition to nicotine.  Mild weight gain.  Constipation.  Nausea.  Coughing or a sore throat.  Changes in how your medicines work in your body.  A depressed mood.  Difficulty sleeping (insomnia).  After the first 2-3 weeks of quitting, you may start to notice more positive results, such as:  Improved sense of smell and taste.  Decreased coughing and sore throat.  Slower heart rate.  Lower blood pressure.  Clearer skin.  The ability to breathe more easily.  Fewer sick days.  Quitting smoking is very challenging for most people. Do not get discouraged if  you are not successful the first time. Some people need to make many attempts to quit before they achieve long-term success. Do your best to stick to your quit plan, and talk with your health care provider if you have any questions or concerns. This information is not intended to replace advice given to you by your health care provider. Make sure you discuss any questions you have with your health care provider. Document Released: 08/03/2001 Document Revised: 04/06/2016 Document Reviewed: 12/24/2014 Elsevier Interactive Patient Education  [employer].   If you wish to quit smoking, help is available.  For free tobacco cessation program offerings call the [facility] at (803)843-7041 or Live Well Line at (641)870-8772. You may also visit [email or web address] or email [email or web address]  for more information on other programs.    Follow with Primary MD  [name], MD  and other consultant's as instructed your Hospitalist MD  Please get a complete blood count and chemistry panel checked by your Primary MD at your next visit, and again as instructed by your Primary MD.  Get Medicines reviewed and adjusted: Please take all your medications with you for your next visit with your Primary MD  Laboratory/radiological data: Please request your Primary MD to go over all hospital tests and procedure/radiological results at the follow up, please ask your Primary MD to get all Hospital records sent to his/her office.  In some cases, they will be blood work, cultures and biopsy results pending at the time of your discharge. Please request that your primary care M.D.  follows up on these results.  Also Note the following: If you experience worsening of your admission symptoms, develop shortness of breath, life threatening emergency, suicidal or homicidal thoughts you must seek medical attention immediately by calling 911 or calling your MD immediately  if symptoms less  severe.  You must read complete instructions/literature along with all the possible adverse reactions/side effects for all the Medicines you take and that have been prescribed to you. Take any new Medicines after you have completely understood and accpet all the possible adverse reactions/side effects.   Do not drive when taking Pain medications or sleeping medications (Benzodaizepines)  Do not take more than prescribed Pain, Sleep and Anxiety Medications. It is not advisable to combine anxiety,sleep and pain medications without talking with your primary care practitioner  Special Instructions: If you have smoked or chewed Tobacco  in the last 2 yrs please stop smoking, stop any regular Alcohol  and or any Recreational drug use.  Wear Seat belts while driving.  Please note: You were cared for by a hospitalist during your hospital stay. Once you are discharged, your primary care physician will handle any further medical issues. Please note that NO REFILLS for any discharge medications will be authorized once you are discharged, as it is imperative that you return to your primary care physician (or establish a relationship with a primary care physician if you do not have one) for your post hospital discharge needs so that they can reassess your need for medications and monitor your lab values.

## 2018-01-22 LAB — CULTURE, BODY FLUID W GRAM STAIN -BOTTLE: Culture: NO GROWTH

## 2018-01-24 ENCOUNTER — Encounter (HOSPITAL_COMMUNITY): Payer: Self-pay

## 2018-01-24 ENCOUNTER — Other Ambulatory Visit: Payer: Self-pay

## 2018-01-24 ENCOUNTER — Inpatient Hospital Stay (HOSPITAL_COMMUNITY)
Admission: EM | Admit: 2018-01-24 | Discharge: 2018-01-27 | DRG: 190 | Disposition: A | Discharge status: Home Health | Payer: Medicaid Other | Attending: Internal Medicine | Admitting: Internal Medicine

## 2018-01-24 ENCOUNTER — Ambulatory Visit (HOSPITAL_COMMUNITY): Payer: Medicaid Other | Admitting: Internal Medicine

## 2018-01-24 ENCOUNTER — Emergency Department (HOSPITAL_COMMUNITY): Payer: Medicaid Other

## 2018-01-24 DIAGNOSIS — C3491 Malignant neoplasm of unspecified part of right bronchus or lung: Secondary | ICD-10-CM | POA: Diagnosis present

## 2018-01-24 DIAGNOSIS — I5033 Acute on chronic diastolic (congestive) heart failure: Secondary | ICD-10-CM | POA: Diagnosis present

## 2018-01-24 DIAGNOSIS — M549 Dorsalgia, unspecified: Secondary | ICD-10-CM | POA: Diagnosis present

## 2018-01-24 DIAGNOSIS — J440 Chronic obstructive pulmonary disease with acute lower respiratory infection: Secondary | ICD-10-CM | POA: Diagnosis present

## 2018-01-24 DIAGNOSIS — E876 Hypokalemia: Secondary | ICD-10-CM | POA: Diagnosis present

## 2018-01-24 DIAGNOSIS — J90 Pleural effusion, not elsewhere classified: Secondary | ICD-10-CM | POA: Diagnosis not present

## 2018-01-24 DIAGNOSIS — Z86718 Personal history of other venous thrombosis and embolism: Secondary | ICD-10-CM

## 2018-01-24 DIAGNOSIS — Z9221 Personal history of antineoplastic chemotherapy: Secondary | ICD-10-CM

## 2018-01-24 DIAGNOSIS — Z923 Personal history of irradiation: Secondary | ICD-10-CM

## 2018-01-24 DIAGNOSIS — M7989 Other specified soft tissue disorders: Secondary | ICD-10-CM | POA: Diagnosis present

## 2018-01-24 DIAGNOSIS — Z66 Do not resuscitate: Secondary | ICD-10-CM | POA: Diagnosis present

## 2018-01-24 DIAGNOSIS — M797 Fibromyalgia: Secondary | ICD-10-CM | POA: Diagnosis present

## 2018-01-24 DIAGNOSIS — F17200 Nicotine dependence, unspecified, uncomplicated: Secondary | ICD-10-CM | POA: Diagnosis present

## 2018-01-24 DIAGNOSIS — I82622 Acute embolism and thrombosis of deep veins of left upper extremity: Secondary | ICD-10-CM | POA: Diagnosis present

## 2018-01-24 DIAGNOSIS — I248 Other forms of acute ischemic heart disease: Secondary | ICD-10-CM | POA: Diagnosis present

## 2018-01-24 DIAGNOSIS — F418 Other specified anxiety disorders: Secondary | ICD-10-CM | POA: Diagnosis present

## 2018-01-24 DIAGNOSIS — Z880 Allergy status to penicillin: Secondary | ICD-10-CM

## 2018-01-24 DIAGNOSIS — I110 Hypertensive heart disease with heart failure: Secondary | ICD-10-CM | POA: Diagnosis present

## 2018-01-24 DIAGNOSIS — Z9981 Dependence on supplemental oxygen: Secondary | ICD-10-CM

## 2018-01-24 DIAGNOSIS — I5043 Acute on chronic combined systolic (congestive) and diastolic (congestive) heart failure: Secondary | ICD-10-CM

## 2018-01-24 DIAGNOSIS — R000 Tachycardia, unspecified: Secondary | ICD-10-CM | POA: Diagnosis not present

## 2018-01-24 DIAGNOSIS — E7800 Pure hypercholesterolemia, unspecified: Secondary | ICD-10-CM | POA: Diagnosis present

## 2018-01-24 DIAGNOSIS — G4730 Sleep apnea, unspecified: Secondary | ICD-10-CM | POA: Diagnosis present

## 2018-01-24 DIAGNOSIS — Z7189 Other specified counseling: Secondary | ICD-10-CM | POA: Diagnosis not present

## 2018-01-24 DIAGNOSIS — F17210 Nicotine dependence, cigarettes, uncomplicated: Secondary | ICD-10-CM | POA: Diagnosis present

## 2018-01-24 DIAGNOSIS — E785 Hyperlipidemia, unspecified: Secondary | ICD-10-CM | POA: Diagnosis present

## 2018-01-24 DIAGNOSIS — E039 Hypothyroidism, unspecified: Secondary | ICD-10-CM | POA: Diagnosis present

## 2018-01-24 DIAGNOSIS — G8929 Other chronic pain: Secondary | ICD-10-CM | POA: Diagnosis present

## 2018-01-24 DIAGNOSIS — M1990 Unspecified osteoarthritis, unspecified site: Secondary | ICD-10-CM | POA: Diagnosis present

## 2018-01-24 DIAGNOSIS — J9621 Acute and chronic respiratory failure with hypoxia: Secondary | ICD-10-CM | POA: Diagnosis present

## 2018-01-24 DIAGNOSIS — K219 Gastro-esophageal reflux disease without esophagitis: Secondary | ICD-10-CM | POA: Diagnosis present

## 2018-01-24 DIAGNOSIS — J441 Chronic obstructive pulmonary disease with (acute) exacerbation: Secondary | ICD-10-CM | POA: Diagnosis not present

## 2018-01-24 DIAGNOSIS — Z7951 Long term (current) use of inhaled steroids: Secondary | ICD-10-CM

## 2018-01-24 DIAGNOSIS — Z7952 Long term (current) use of systemic steroids: Secondary | ICD-10-CM

## 2018-01-24 DIAGNOSIS — Z853 Personal history of malignant neoplasm of breast: Secondary | ICD-10-CM

## 2018-01-24 DIAGNOSIS — Z515 Encounter for palliative care: Secondary | ICD-10-CM | POA: Diagnosis not present

## 2018-01-24 DIAGNOSIS — Z79899 Other long term (current) drug therapy: Secondary | ICD-10-CM

## 2018-01-24 DIAGNOSIS — Z86010 Personal history of colonic polyps: Secondary | ICD-10-CM

## 2018-01-24 DIAGNOSIS — Z7901 Long term (current) use of anticoagulants: Secondary | ICD-10-CM

## 2018-01-24 LAB — CBC WITH DIFFERENTIAL/PLATELET
BASOS PCT: 0 %
Basophils Absolute: 0 10*3/uL (ref 0.0–0.1)
Eosinophils Absolute: 0 10*3/uL (ref 0.0–0.7)
Eosinophils Relative: 0 %
HEMATOCRIT: 46.7 % — AB (ref 36.0–46.0)
Hemoglobin: 14.8 g/dL (ref 12.0–15.0)
LYMPHS PCT: 3 %
Lymphs Abs: 0.5 10*3/uL — ABNORMAL LOW (ref 0.7–4.0)
MCH: 25.5 pg — ABNORMAL LOW (ref 26.0–34.0)
MCHC: 31.7 g/dL (ref 30.0–36.0)
MCV: 80.4 fL (ref 78.0–100.0)
MONO ABS: 1.2 10*3/uL — AB (ref 0.1–1.0)
MONOS PCT: 7 %
NEUTROS ABS: 16.2 10*3/uL — AB (ref 1.7–7.7)
Neutrophils Relative %: 90 %
Platelets: 176 10*3/uL (ref 150–400)
RBC: 5.81 MIL/uL — ABNORMAL HIGH (ref 3.87–5.11)
RDW: 19.3 % — AB (ref 11.5–15.5)
WBC: 17.9 10*3/uL — ABNORMAL HIGH (ref 4.0–10.5)

## 2018-01-24 LAB — PROTIME-INR
INR: 3.78
PROTHROMBIN TIME: 37 s — AB (ref 11.4–15.2)

## 2018-01-24 LAB — COMPREHENSIVE METABOLIC PANEL
ALK PHOS: 121 U/L (ref 38–126)
ALT: 89 U/L — AB (ref 14–54)
AST: 83 U/L — ABNORMAL HIGH (ref 15–41)
Albumin: 3.5 g/dL (ref 3.5–5.0)
Anion gap: 13 (ref 5–15)
BUN: 22 mg/dL — ABNORMAL HIGH (ref 6–20)
CALCIUM: 8.8 mg/dL — AB (ref 8.9–10.3)
CO2: 32 mmol/L (ref 22–32)
CREATININE: 0.87 mg/dL (ref 0.44–1.00)
Chloride: 91 mmol/L — ABNORMAL LOW (ref 101–111)
Glucose, Bld: 84 mg/dL (ref 65–99)
Potassium: 3.2 mmol/L — ABNORMAL LOW (ref 3.5–5.1)
Sodium: 136 mmol/L (ref 135–145)
Total Bilirubin: 2.7 mg/dL — ABNORMAL HIGH (ref 0.3–1.2)
Total Protein: 7.7 g/dL (ref 6.5–8.1)

## 2018-01-24 LAB — MAGNESIUM: Magnesium: 1.5 mg/dL — ABNORMAL LOW (ref 1.7–2.4)

## 2018-01-24 LAB — BRAIN NATRIURETIC PEPTIDE: B Natriuretic Peptide: 744 pg/mL — ABNORMAL HIGH (ref 0.0–100.0)

## 2018-01-24 LAB — TSH: TSH: 2.64 u[IU]/mL (ref 0.350–4.500)

## 2018-01-24 LAB — TROPONIN I: Troponin I: 0.03 ng/mL (ref <0.03)

## 2018-01-24 MED ORDER — METHOCARBAMOL 500 MG PO TABS: 500.0000 mg | ORAL_TABLET | Freq: Four times a day (QID) | ORAL | Status: DC | PRN

## 2018-01-24 MED ORDER — ACETAMINOPHEN 325 MG PO TABS: 650.0000 mg | ORAL_TABLET | ORAL | Status: DC | PRN

## 2018-01-24 MED ORDER — METOLAZONE 5 MG PO TABS
2.5000 mg | ORAL_TABLET | Freq: Every day | ORAL | Status: AC
  Administered 2018-01-25: 2.5 mg via ORAL
  Filled 2018-01-24: qty 1

## 2018-01-24 MED ORDER — IRBESARTAN 75 MG PO TABS
75.0000 mg | ORAL_TABLET | Freq: Every day | ORAL | Status: DC
  Administered 2018-01-25: 75 mg via ORAL
  Filled 2018-01-24: qty 1

## 2018-01-24 MED ORDER — SODIUM CHLORIDE 0.9% FLUSH
3.0000 mL | Freq: Two times a day (BID) | INTRAVENOUS | Status: DC
  Administered 2018-01-24 – 2018-01-27 (×6): 3 mL via INTRAVENOUS

## 2018-01-24 MED ORDER — SODIUM CHLORIDE 0.9% FLUSH: 3.0000 mL | INTRAVENOUS | Status: DC | PRN

## 2018-01-24 MED ORDER — ROSUVASTATIN CALCIUM 10 MG PO TABS
5.0000 mg | ORAL_TABLET | Freq: Every day | ORAL | Status: DC
  Administered 2018-01-25 – 2018-01-26 (×2): 5 mg via ORAL
  Filled 2018-01-24 (×2): qty 1

## 2018-01-24 MED ORDER — BUDESONIDE 0.5 MG/2ML IN SUSP
0.5000 mg | Freq: Two times a day (BID) | RESPIRATORY_TRACT | Status: DC
  Administered 2018-01-24 – 2018-01-27 (×6): 0.5 mg via RESPIRATORY_TRACT
  Filled 2018-01-24 (×6): qty 2

## 2018-01-24 MED ORDER — RIVAROXABAN 20 MG PO TABS: 20.0000 mg | ORAL_TABLET | Freq: Every day | ORAL | Status: DC

## 2018-01-24 MED ORDER — MECLIZINE HCL 12.5 MG PO TABS
12.5000 mg | ORAL_TABLET | Freq: Three times a day (TID) | ORAL | Status: DC | PRN
Start: 2018-01-24 — End: 2018-01-27

## 2018-01-24 MED ORDER — NICOTINE 21 MG/24HR TD PT24
21.0000 mg | MEDICATED_PATCH | Freq: Every day | TRANSDERMAL | Status: DC
  Administered 2018-01-24 – 2018-01-27 (×4): 21 mg via TRANSDERMAL
  Filled 2018-01-24 (×4): qty 1

## 2018-01-24 MED ORDER — METOPROLOL TARTRATE 50 MG PO TABS
75.0000 mg | ORAL_TABLET | Freq: Two times a day (BID) | ORAL | Status: DC
  Administered 2018-01-24 – 2018-01-27 (×5): 75 mg via ORAL
  Filled 2018-01-24 (×6): qty 2

## 2018-01-24 MED ORDER — PANTOPRAZOLE SODIUM 40 MG PO TBEC
40.0000 mg | DELAYED_RELEASE_TABLET | Freq: Every day | ORAL | Status: DC
  Administered 2018-01-25 – 2018-01-27 (×3): 40 mg via ORAL
  Filled 2018-01-24 (×3): qty 1

## 2018-01-24 MED ORDER — METHYLPREDNISOLONE SODIUM SUCC 40 MG IJ SOLR
40.0000 mg | Freq: Two times a day (BID) | INTRAMUSCULAR | Status: DC
  Administered 2018-01-25 – 2018-01-27 (×5): 40 mg via INTRAVENOUS
  Filled 2018-01-24 (×6): qty 1

## 2018-01-24 MED ORDER — ONDANSETRON HCL 4 MG/2ML IJ SOLN: 4.0000 mg | Freq: Four times a day (QID) | INTRAMUSCULAR | Status: DC | PRN

## 2018-01-24 MED ORDER — POTASSIUM CHLORIDE CRYS ER 10 MEQ PO TBCR
30.0000 meq | EXTENDED_RELEASE_TABLET | Freq: Every day | ORAL | Status: DC
  Filled 2018-01-24 (×2): qty 3

## 2018-01-24 MED ORDER — IPRATROPIUM-ALBUTEROL 0.5-2.5 (3) MG/3ML IN SOLN
3.0000 mL | Freq: Four times a day (QID) | RESPIRATORY_TRACT | Status: DC
  Administered 2018-01-24 – 2018-01-26 (×8): 3 mL via RESPIRATORY_TRACT
  Filled 2018-01-24 (×9): qty 3

## 2018-01-24 MED ORDER — LEVOTHYROXINE SODIUM 100 MCG PO TABS
100.0000 ug | ORAL_TABLET | Freq: Every day | ORAL | Status: DC
Start: 2018-01-25 — End: 2018-01-27
  Administered 2018-01-25 – 2018-01-27 (×3): 100 ug via ORAL
  Filled 2018-01-24 (×3): qty 1

## 2018-01-24 MED ORDER — SODIUM CHLORIDE 0.9 % IV SOLN
250.0000 mL | INTRAVENOUS | Status: DC | PRN
Start: 2018-01-24 — End: 2018-01-27
  Administered 2018-01-25: 250 mL via INTRAVENOUS

## 2018-01-24 MED ORDER — DULOXETINE HCL 60 MG PO CPEP
60.0000 mg | ORAL_CAPSULE | Freq: Every day | ORAL | Status: DC
  Administered 2018-01-25 – 2018-01-27 (×3): 60 mg via ORAL
  Filled 2018-01-24 (×3): qty 1

## 2018-01-24 MED ORDER — GABAPENTIN 300 MG PO CAPS
600.0000 mg | ORAL_CAPSULE | Freq: Four times a day (QID) | ORAL | Status: DC
  Administered 2018-01-24 – 2018-01-27 (×9): 600 mg via ORAL
  Filled 2018-01-24 (×5): qty 2
  Filled 2018-01-24: qty 6
  Filled 2018-01-24: qty 2
  Filled 2018-01-24: qty 6
  Filled 2018-01-24 (×2): qty 2

## 2018-01-24 MED ORDER — FUROSEMIDE 10 MG/ML IJ SOLN
40.0000 mg | INTRAMUSCULAR | Status: AC
  Administered 2018-01-24: 40 mg via INTRAVENOUS
  Filled 2018-01-24: qty 4

## 2018-01-24 MED ORDER — FUROSEMIDE 10 MG/ML IJ SOLN
40.0000 mg | Freq: Two times a day (BID) | INTRAMUSCULAR | Status: DC
  Administered 2018-01-24 – 2018-01-26 (×4): 40 mg via INTRAVENOUS
  Filled 2018-01-24 (×4): qty 4

## 2018-01-24 MED ORDER — TAPENTADOL HCL ER 50 MG PO TB12
100.0000 mg | ORAL_TABLET | Freq: Two times a day (BID) | ORAL | Status: DC
  Administered 2018-01-24 – 2018-01-27 (×5): 100 mg via ORAL
  Filled 2018-01-24 (×6): qty 2

## 2018-01-24 NOTE — ED Provider Notes (Signed)
[facility] EMERGENCY DEPARTMENT Provider Note   CSN: [id number] Arrival date & time: 01/24/18  0930     History   Chief Complaint Chief Complaint  Patient presents with  . Leg Swelling    HPI Sydney Blair is a 62 y.o. female.  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  She was admitted to the hospital recently, found to have some pleural effusions which were drained, she did well and was discharged however since going home she has had significant increasing swelling of her bilateral lower extremities.  She denies specific pain other than the discomfort in her legs, she has no chest pain, she even does feel increased shortness of breath but denies any chest pain.  There has been no fevers, is only a slight cough.  She does have chronic COPD on chronic oxygen, she still smokes a pack of cigarettes per day.  Swelling is persistent, gradually worsening and is now become severe.  She does not recall having swelling like this in the past.    Past Medical History:  Diagnosis Date  . Anxiety   . ARF (acute renal failure) ([place]) 08/07/2011   ?   Marland Kitchen Arthritis   . Cancer ([place]) 2005   breast-right  . Chronic back pain   . COPD (chronic obstructive pulmonary disease) ([place])   . Depression   . Depression with anxiety   . Dyspnea    on exertion  . Fibromyalgia   . GERD (gastroesophageal reflux disease)   . History of radiation therapy 03/24/17-05/12/17   right lun 2 Gy in 30 fractions  . Hypercholesteremia   . Hypothyroidism   . Hypothyroidism 06/19/2017  . Incontinence   . Neuropathy   . Peripheral neuropathy   . Sciatic pain   .  Sebaceous cyst    lt labial location  . Sleep apnea    uses CPAP  . Stage III squamous cell carcinoma of right lung ([place]) 03/10/2017  . Tobacco abuse     Patient Active Problem List   Diagnosis Date Noted  . DNR (do not resuscitate) discussion   . Palliative care by specialist   . Shortness of breath 01/13/2018  . Pleural effusion 01/13/2018  . Malignant neoplasm of right lung ([place])   . Hypothyroidism 06/19/2017  . Hypomagnesemia 06/19/2017  . Encounter for antineoplastic immunotherapy 06/06/2017  . Chronic fatigue 06/06/2017  . Community acquired pneumonia 05/26/2017  . Pressure injury of skin 05/18/2017  . Hepatitis 05/16/2017  . Nausea vomiting and diarrhea 05/16/2017  . SIRS (systemic inflammatory response syndrome) ([place]) 05/16/2017  . COPD (chronic obstructive pulmonary disease) ([place]) 05/16/2017  . Abnormal CXR 05/16/2017  . Anemia 05/16/2017  . Thrombocytopenia ([place]) 05/16/2017  . Depression 05/16/2017  . Anxiety 05/16/2017  . Chronic pain 05/16/2017  . Chronic respiratory failure with hypoxia ([place]) 05/16/2017  . Stage III squamous cell carcinoma of right lung ([place]) 03/10/2017  . Goals of care, counseling/discussion 03/10/2017  . Encounter for antineoplastic chemotherapy 03/10/2017  . Lung mass 02/01/2017  . Breast mass, right   . Hx of adenomatous colonic polyps 12/28/2016  . Fracture of fifth metatarsal bone of right  foot with delayed healing 02/03/2016  . IBS (irritable bowel syndrome) 07/30/2015  . Diarrhea 10/29/2014  . Hoarseness 10/29/2014  . Chronic hoarseness 01/16/2014  . Other dysphagia 01/16/2014  . Dysphagia, unspecified(787.20) 06/04/2013  . Unspecified constipation 06/04/2013  . Unsteady gait 01/25/2013  . COPD (chronic obstructive pulmonary disease) with emphysema ([place]) 12/26/2012  . DOE (dyspnea on exertion) 12/01/2012  . 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 Dysphagia 11/09/2012  . COPD exacerbation  ([place]) 08/07/2011  . Hypokalemia 08/07/2011  . Myositis 08/07/2011  . Dehydration 08/07/2011  . ARF (acute renal failure) ([place]) 08/07/2011  . Tubular adenoma 07/20/2011  . INSOMNIA 02/26/2008  . CERUMEN IMPACTION, RIGHT 01/23/2008  . 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 NECK PAIN 11/22/2007  . HAMMER TOE 11/22/2007  . ANXIETY 01/30/2007  . TOBACCO ABUSE 01/30/2007  . DEPRESSION 01/30/2007  . GERD 01/30/2007  . PROLAPSE, VAGINAL WALL, CYSTOCELE, MIDLINE 01/10/2007  . INCONTINENCE 01/10/2007  . SEBACEOUS CYST 08/17/2006  . Breast cancer, right breast ([place]) 12/01/2003    Past Surgical History:  Procedure Laterality Date  . bladder tack    . BREAST BIOPSY Right 12/29/2016   Procedure: RIGHT BREAST BIOPSY;  Surgeon: Aviva Signs, MD;  Location: [facility];  Service: General;  Laterality: Right;  . BREAST LUMPECTOMY     right with node dissection  . COLONOSCOPY  11/11/2010   WUJ:[id number] rectal polyp and splenic flexure otherwise normal; pathology showed  tubular adenomas. Next TCS 10/2015.  Marland Kitchen ESOPHAGOGASTRODUODENOSCOPY  11/11/2010   FAO:ZHYQM hiatal hernia, 7 French dilator  . ESOPHAGOGASTRODUODENOSCOPY (EGD) WITH ESOPHAGEAL DILATION N/A 06/21/2013   VHQ:IONGEX esophagus  - status post Maloney dilation s/p bx (benign esophageal bx)  . PORTACATH PLACEMENT Left 07/06/2017   Procedure: INSERTION PORT-A-CATH LEFT SUBCLAVIAN;  Surgeon: Virl Cagey, MD;  Location: [facility];  Service: General;  Laterality: Left;  Marland Kitchen VIDEO BRONCHOSCOPY WITH ENDOBRONCHIAL ULTRASOUND N/A 02/28/2017   Procedure: VIDEO BRONCHOSCOPY WITH ENDOBRONCHIAL ULTRASOUND;  Surgeon: Juanito Doom, MD;  Location: [facility] OR;  Service: Thoracic;  Laterality: N/A;     OB History   None      Home Medications    Prior to Admission medications   Medication Sig Start Date End Date Taking? Authorizing Provider  albuterol (PROVENTIL HFA;VENTOLIN HFA) 108 (90 Base) MCG/ACT inhaler Inhale 2 puffs into the lungs every 6  (six) hours as needed for wheezing or shortness of breath. 01/18/18  Yes Johnson, Clanford L, MD  albuterol (PROVENTIL) (2.5 MG/3ML) 0.083% nebulizer solution Take 3 mLs (2.5 mg total) by nebulization every 4 (four) hours as needed for wheezing or shortness of breath. 01/18/18 02/17/18 Yes Johnson, Clanford L, MD  budesonide-formoterol (SYMBICORT) 160-4.5 MCG/ACT inhaler Inhale 2 puffs into the lungs daily.    Yes [provider]  dexlansoprazole (DEXILANT) 60 MG capsule Take 60 mg by mouth daily.   Yes [provider]  DULoxetine (CYMBALTA) 60 MG capsule Take 60 mg by mouth daily.   Yes [provider]  furosemide (LASIX) 20 MG tablet Take 20 mg by mouth daily. As needed   Yes [provider]  gabapentin (NEURONTIN) 800 MG tablet Take 800 mg by mouth 4 (four) times daily.    Yes [provider]  levothyroxine (SYNTHROID, LEVOTHROID) 100 MCG tablet Take 100 mcg by mouth daily before breakfast. Reported on 02/03/2016   Yes [provider]  meclizine (ANTIVERT) 12.5 MG tablet Take 25 mg by mouth daily.   Yes [provider]  methocarbamol (ROBAXIN) 500 MG tablet Take 500 mg by mouth every 6 (six) hours as needed for muscle spasms.   Yes [provider]  metoprolol tartrate 75 MG TABS Take 75 mg by mouth 2 (two) times daily. 01/18/18 02/17/18 Yes Johnson, Clanford L, MD  potassium chloride (K-DUR) 10 MEQ tablet Take 2 tablets (20 mEq total) by mouth daily. 11/29/17  Yes Derek Jack, MD  predniSONE (DELTASONE) 20 MG tablet One tablet PO daily for 5 days, then half tablet daily for 5 days, then stop 01/18/18  Yes Johnson, Clanford L, MD  Rivaroxaban 15 & 20 MG TBPK Take 20 mg by mouth daily. 01/18/18 02/17/18 Yes Johnson, Clanford L, MD  rosuvastatin (CRESTOR) 5 MG tablet Take 1 tablet (5 mg total) by mouth daily. 10/26/17 01/24/18 Yes Herminio Commons, MD  tapentadol (NUCYNTA ER) 100 MG 12 hr tablet Take 100 mg by mouth every 12  (twelve) hours.    Yes [provider]  tiotropium (SPIRIVA) 18 MCG inhalation capsule Place 1 capsule (18 mcg total) into inhaler and inhale daily. 01/31/15  Yes Clance, Armando Reichert, MD  Misc. Devices MISC Please provide patient with a nebulizer machine 11/29/17   Derek Jack, MD    Family History Family History  Problem Relation Age of Onset  . Cancer Other   . Heart attack Other   . Lung disease Other   . Emphysema Mother   . Pneumonia Mother   . Cancer Father   . Autoimmune disease Neg Hx     Social History Social History   Tobacco Use  . Smoking status: Current Every Day Smoker    Packs/day: 1.00    Years: 35.00    Pack years: 35.00    Types: Cigarettes  . Smokeless tobacco: Never Used  . Tobacco comment: has cut back to one pack daily  Substance Use Topics  . Alcohol use: No    Alcohol/week: 0.0 oz  . Drug use: No     Allergies   Penicillins   Review of Systems Review of Systems  All other systems reviewed and are negative.    Physical Exam Updated Vital Signs BP (!) 143/99   Pulse (!) 123   Temp 97.9 F (36.6 C) (Oral)   Resp (!) 29   Wt 70.3 kg (155 lb)   SpO2 96%   BMI 27.46 kg/m   Physical Exam  Constitutional: She appears well-developed and well-nourished. She appears distressed.  HENT:  Head: Normocephalic and atraumatic.  Mouth/Throat: Oropharynx is clear and moist. No oropharyngeal exudate.  Eyes: Pupils are equal, round, and reactive to light. Conjunctivae and EOM are normal. Right eye exhibits no discharge. Left eye exhibits no discharge. No scleral icterus.  Neck: Normal range of motion. Neck supple. No JVD present. No thyromegaly present.  Cardiovascular: Regular rhythm and intact distal pulses. Exam reveals no gallop and no friction rub.  Murmur heard. Tachycardic to 120 bpm  Pulmonary/Chest: Effort normal. No respiratory distress. She has wheezes. She has rales.  Abdominal: Soft. Bowel sounds are normal. She exhibits  no distension and no mass. There is no tenderness.  Musculoskeletal: Normal range of motion. She exhibits edema. She exhibits no tenderness.  Lymphadenopathy:    She has no cervical adenopathy.  Neurological: She is alert. Coordination normal.  Skin: Skin is warm and dry. Rash noted. No erythema.  petechial rash to the left wrist, purpura over the right knee  Psychiatric: She has a normal mood and affect. Her behavior is normal.  Nursing note and vitals reviewed.    ED Treatments / Results  Labs (all labs ordered are listed, but only abnormal results are displayed) Labs Reviewed  CBC WITH DIFFERENTIAL/PLATELET - Abnormal; Notable for the following components:      Result Value   WBC 17.9 (*)    RBC 5.81 (*)    HCT 46.7 (*)    MCH 25.5 (*)    RDW 19.3 (*)    Neutro Abs 16.2 (*)    Lymphs Abs 0.5 (*)    Monocytes Absolute 1.2 (*)    All other components within normal limits  BRAIN NATRIURETIC PEPTIDE - Abnormal; Notable for the following components:   B Natriuretic Peptide 744.0 (*)    All other components within normal limits  TROPONIN I - Abnormal; Notable for the following components:   Troponin I 0.03 (*)    All other components within normal limits  COMPREHENSIVE METABOLIC PANEL - Abnormal; Notable for the following components:   Potassium 3.2 (*)    Chloride 91 (*)    BUN 22 (*)    Calcium 8.8 (*)    AST 83 (*)    ALT 89 (*)    Total Bilirubin 2.7 (*)    All other components within normal limits  PROTIME-INR - Abnormal; Notable for the following components:   Prothrombin Time 37.0 (*)    All other components within normal limits  URINALYSIS, ROUTINE W REFLEX MICROSCOPIC    EKG EKG Interpretation  Date/Time:  Tuesday January 24 2018 09:43:44 EDT Ventricular Rate:  124 PR Interval:    QRS Duration: 80 QT Interval:  316 QTC Calculation: 454 R Axis:   132 Text Interpretation:  Sinus or ectopic atrial tachycardia Right axis deviation Low voltage, precordial leads  Anteroseptal infarct, old Repol abnrm suggests ischemia, diffuse leads Baseline wander in lead(s) II aVF V4 V6 since last tracing no significant change Confirmed by Noemi Chapel [phone]) on 01/24/2018 10:14:26 AM   Radiology Dg Chest Port 1 View  Result Date: 01/24/2018 CLINICAL DATA:  Shortness of breath. History of breast cancer. COPD. Prior radiation therapy to right lung. Right lung cancer. EXAM: PORTABLE CHEST 1 VIEW COMPARISON:  01/17/2018 FINDINGS: Left Port-A-Cath in place with the tip in the SVC. Chronic density in the right upper lobe is stable since prior study, likely postradiation changes. Small right pleural effusion and right base atelectasis. No confluent opacity on the left. Heart is borderline in size. IMPRESSION: Stable chronic changes in the right upper lobe, likely postradiation changes. Small right effusion with right base atelectasis. Electronically Signed   By: Rolm Baptise M.D.   On: 01/24/2018 10:29    Procedures Procedures (including critical care time)  Medications Ordered in ED Medications  furosemide (LASIX) injection 40 mg (40 mg Intravenous Given 01/24/18 1131)     Initial Impression / Assessment and Plan / ED Course  I have reviewed the triage vital signs and the nursing notes.  Pertinent labs & imaging results that were available during my care of the patient were reviewed by me and considered in my medical decision making (see chart for details).     The patient is actually ill-appearing with significant bilateral lower edema which is symmetrical, she has a new rash which appears petechial and purpuric and is concerning for underlying hematologic abnormalities.  She is tachycardic and we do not have an answer for that unless she is in acute congestive heart failure, she could be an underlying atrial flutter with variable block,  she may also have pneumonia or recurrent pleural effusions.  Unfortunately there are multiple etiologies which could be coming into play  in this very ill lady.  She will likely need to be admitted to the hospital.  Peripheral edema, she does have an elevated BNP as well as an elevated white blood cell count.  I discussed her care with the hospitalist Dr. Carmelina Noun who will admit.  Final Clinical Impressions(s) / ED Diagnoses   Final diagnoses:  Acute on chronic combined systolic and diastolic congestive heart failure (HCC)      Noemi Chapel, MD 01/24/18 1237

## 2018-01-24 NOTE — Progress Notes (Signed)
Patient came to this Nurse on 2 L of oxygen, patient cyanotic around mouth, nose, hands and feet. Patient SPO2 53%, bumped oxygen up to 5 L, patient now sustaining at 99%. Patient has spoken with MD, patient has requested to be a DNR. Patient is currently sleeping, not arousable enough to given any PO medications, Holding PO medications for now.

## 2018-01-24 NOTE — ED Notes (Signed)
CRITICAL VALUE ALERT  Critical Value:  Trop 0.03  Date & Time Notied:  01/24/18, 1044  Provider Notified: Dr. Sabra Heck  Orders Received/Actions taken: no new orders at this time

## 2018-01-24 NOTE — ED Triage Notes (Signed)
Pt is here for leg swelling. Bilateral leg swelling started Thursday and Friday as well as bruising to knees. Pt has a carotid artery blood clot and was started on xarelto a month ago. 3+ pitting edema noted.

## 2018-01-24 NOTE — Progress Notes (Signed)
Pt is refusing to wear CPAP for sleep. Pt encouraged to wear CPAP but continues to refuse

## 2018-01-24 NOTE — H&P (Signed)
History and Physical    Sydney Blair PXT:[id number] DOB: [birth year] DOA: 01/24/2018  Referring MD/NP/PA: Dr. Sabra Heck PCP: [name], MD  Patient coming from: Home  Chief Complaint: Shortness of breath and worsening lower extremity edema.  HPI: Sydney Blair is a 62 y.o. female 63 year old female with a past medical history significant for COPD with (chronic respiratory failure on oxygen supplementation), history of lung cancer with recurrent pleural effusions, depression with anxiety, chronic back pain, gastroesophageal reflux disease, diastolic heart failure, hypertension, hyperlipidemia, tobacco abuse and sleep apnea (intermittent use of CPAP reported); who presented to the emergency department secondary to increase swelling on her extremities and shortness of breath.  Patient was recently admitted secondary to acute on chronic respiratory failure with hypoxia/hypercapnia due to COPD exacerbation; she was discharged on Jan 18, 2018; at that time she was back to her 2 L of oxygen supplementation and was given instructions to complete steroids tapering. There has not been any fevers, no complaint of productive cough, no chest pain, no headaches, no nausea, no vomiting, no abdominal pain, no dysuria or hematuria, no melena, no hematochezia.  In the ED patient chest x-ray demonstrated chronic scarring but no significant reaccumulation of her pleural effusion.  She was found with Rales on auscultation, elevated BNP and 3+ edema bilaterally; 1 dose of IV Lasix was given TRH consulted to admit patient for further evaluation and treatment of what appears to be Exacerbation of her heart failure.  Past Medical/Surgical History: Past Medical History:  Diagnosis Date  . Anxiety   . ARF (acute renal failure) ([place]) 08/07/2011   ?   Marland Kitchen Arthritis   . Cancer ([place]) 2005   breast-right  . Chronic back pain   . COPD (chronic obstructive pulmonary disease) ([place])   . Depression   . Depression  with anxiety   . Dyspnea    on exertion  . Fibromyalgia   . GERD (gastroesophageal reflux disease)   . History of radiation therapy 03/24/17-05/12/17   right lun 2 Gy in 30 fractions  . Hypercholesteremia   . Hypothyroidism   . Hypothyroidism 06/19/2017  . Incontinence   . Neuropathy   . Peripheral neuropathy   . Sciatic pain   . Sebaceous cyst    lt labial location  . Sleep apnea    uses CPAP  . Stage III squamous cell carcinoma of right lung ([place]) 03/10/2017  . Tobacco abuse     Past Surgical History:  Procedure Laterality Date  . bladder tack    . BREAST BIOPSY Right 12/29/2016   Procedure: RIGHT BREAST BIOPSY;  Surgeon: Aviva Signs, MD;  Location: [facility];  Service: General;  Laterality: Right;  . BREAST LUMPECTOMY     right with node dissection  . COLONOSCOPY  11/11/2010   OEV:[id number] rectal polyp and splenic flexure otherwise normal; pathology showed  tubular adenomas. Next TCS 10/2015.  Marland Kitchen ESOPHAGOGASTRODUODENOSCOPY  11/11/2010   EXH:BZJIR hiatal hernia, 57 French dilator  . ESOPHAGOGASTRODUODENOSCOPY (EGD) WITH ESOPHAGEAL DILATION N/A 06/21/2013   CVE:LFYBOF esophagus  - status post Maloney dilation s/p bx (benign esophageal bx)  . PORTACATH PLACEMENT Left 07/06/2017   Procedure: INSERTION PORT-A-CATH LEFT SUBCLAVIAN;  Surgeon: Virl Cagey, MD;  Location: [facility];  Service: General;  Laterality: Left;  Marland Kitchen VIDEO BRONCHOSCOPY WITH ENDOBRONCHIAL ULTRASOUND N/A 02/28/2017   Procedure: VIDEO BRONCHOSCOPY WITH ENDOBRONCHIAL ULTRASOUND;  Surgeon: Juanito Doom, MD;  Location: [facility];  Service: Thoracic;  Laterality: N/A;    Social History:  reports  that she has been smoking cigarettes.  She has a 35.00 pack-year smoking history. She has never used smokeless tobacco. She reports that she does not drink alcohol or use drugs.  Allergies: Allergies  Allergen Reactions  . Penicillins Anaphylaxis and Other (See Comments)    Has patient had a PCN reaction causing immediate  rash, facial/tongue/throat swelling, SOB or lightheadedness with hypotension:Yes Has patient had a PCN reaction causing severe rash involving mucus membranes or skin necrosis:Yes Has patient had a PCN reaction that required hospitalization:No Has patient had a PCN reaction occurring within the last 10 years:No If all of the above answers are "NO", then may proceed with Cephalosporin use.     Family History: Family History  Problem Relation Age of Onset  . Cancer Other   . Heart attack Other   . Lung disease Other   . Emphysema Mother   . Pneumonia Mother   . Cancer Father   . Autoimmune disease Neg Hx     Prior to Admission medications   Medication Sig Start Date End Date Taking? Authorizing Provider  albuterol (PROVENTIL HFA;VENTOLIN HFA) 108 (90 Base) MCG/ACT inhaler Inhale 2 puffs into the lungs every 6 (six) hours as needed for wheezing or shortness of breath. 01/18/18  Yes Johnson, Clanford L, MD  albuterol (PROVENTIL) (2.5 MG/3ML) 0.083% nebulizer solution Take 3 mLs (2.5 mg total) by nebulization every 4 (four) hours as needed for wheezing or shortness of breath. 01/18/18 02/17/18 Yes Johnson, Clanford L, MD  budesonide-formoterol (SYMBICORT) 160-4.5 MCG/ACT inhaler Inhale 2 puffs into the lungs daily.    Yes [provider]  dexlansoprazole (DEXILANT) 60 MG capsule Take 60 mg by mouth daily.   Yes [provider]  DULoxetine (CYMBALTA) 60 MG capsule Take 60 mg by mouth daily.   Yes [provider]  furosemide (LASIX) 20 MG tablet Take 20 mg by mouth daily. As needed   Yes [provider]  gabapentin (NEURONTIN) 800 MG tablet Take 800 mg by mouth 4 (four) times daily.    Yes [provider]  levothyroxine (SYNTHROID, LEVOTHROID) 100 MCG tablet Take 100 mcg by mouth daily before breakfast. Reported on 02/03/2016   Yes [provider]  meclizine (ANTIVERT) 12.5 MG tablet Take 25 mg by mouth daily.   Yes [provider]    methocarbamol (ROBAXIN) 500 MG tablet Take 500 mg by mouth every 6 (six) hours as needed for muscle spasms.   Yes [provider]  metoprolol tartrate 75 MG TABS Take 75 mg by mouth 2 (two) times daily. 01/18/18 02/17/18 Yes Johnson, Clanford L, MD  potassium chloride (K-DUR) 10 MEQ tablet Take 2 tablets (20 mEq total) by mouth daily. 11/29/17  Yes Derek Jack, MD  predniSONE (DELTASONE) 20 MG tablet One tablet PO daily for 5 days, then half tablet daily for 5 days, then stop 01/18/18  Yes Johnson, Clanford L, MD  Rivaroxaban 15 & 20 MG TBPK Take 20 mg by mouth daily. 01/18/18 02/17/18 Yes Johnson, Clanford L, MD  rosuvastatin (CRESTOR) 5 MG tablet Take 1 tablet (5 mg total) by mouth daily. 10/26/17 01/24/18 Yes Herminio Commons, MD  tapentadol (NUCYNTA ER) 100 MG 12 hr tablet Take 100 mg by mouth every 12 (twelve) hours.    Yes [provider]  tiotropium (SPIRIVA) 18 MCG inhalation capsule Place 1 capsule (18 mcg total) into inhaler and inhale daily. 01/31/15  Yes Clance, Armando Reichert, MD  Misc. Devices MISC Please provide patient with a nebulizer machine  11/29/17   Derek Jack, MD    Review of Systems:  Constitutional: Denies fever, chills, diaphoresis, appetite change and fatigue.  HEENT: Denies photophobia, eye pain, redness, hearing loss, ear pain, congestion, sore throat, rhinorrhea, sneezing, mouth sores, trouble swallowing, neck pain, neck stiffness and tinnitus.   Respiratory: Denies SOB, DOE, cough, chest tightness,  and wheezing.   Cardiovascular: Denies chest pain, palpitations and leg swelling.  Gastrointestinal: Denies nausea, vomiting, abdominal pain, diarrhea, constipation, blood in stool and abdominal distention.  Genitourinary: Denies dysuria, urgency, frequency, hematuria, flank pain and difficulty urinating.  Endocrine: Denies: hot or cold intolerance, sweats, changes in hair or nails, polyuria, polydipsia. Musculoskeletal: Denies myalgias, back pain,  joint swelling, arthralgias and gait problem.  Skin: Denies pallor, rash and wound.  Neurological: Denies dizziness, seizures, syncope, weakness, light-headedness, numbness and headaches.  Hematological: Denies adenopathy. Easy bruising, personal or family bleeding history  Psychiatric/Behavioral: Denies suicidal ideation, mood changes, confusion, nervousness, sleep disturbance and agitation    Physical Exam: Vitals:   01/24/18 1030 01/24/18 1132 01/24/18 1200 01/24/18 1542  BP: 131/82 129/87 (!) 143/99 (!) 152/100  Pulse:  (!) 123  (!) 133  Resp: (!) 32 19 (!) 29 (!) 24  Temp:  97.9 F (36.6 C)  97.9 F (36.6 C)  TempSrc:  Oral  Oral  SpO2:  96%  96%  Weight:        Constitutional: Frail, chronically ill; denying chest pain, no nausea, no vomiting.  Patient with difficulty speaking in full sentences and requiring higher level of oxygen supplementation to maintain O2 sats.  Patient was able to express that she does in 1 intubation or the use of BiPAP. Eyes: PERRL, lids and conjunctivae normal, no icterus, no nystagmus. ENMT: Mucous membranes are moist. Posterior pharynx clear of any exudate or lesions.  No thrush.  Neck: normal, supple, no masses, no thyromegaly, positive JVD Respiratory: Decreased air movement, positive wheezing, fine crackles bibasilarly; positive tachypnea.  No using accessory muscles. Cardiovascular: Tachycardic, no murmurs, no gallops, no rubs, 2-3+ edema bilaterally, no bruits. Abdomen: no tenderness, no masses palpated. No hepatosplenomegaly. Bowel sounds positive.  Musculoskeletal: no clubbing / cyanosis. No joint deformity upper and lower extremities. Good ROM, no contractures.  Skin: Multiple bruises throughout her upper and lower extremities.  No open wounds.   Neurologic: CN 2-12 grossly intact. Sensation intact, DTR normal. Strength 3-4/5 in all 4 due to poor effort and deconditioning.Marland Kitchen  Psychiatric: Normal judgment and insight. Alert and oriented x 3.  Normal mood.    Labs on Admission: I have personally reviewed the following labs and imaging studies  CBC: Recent Labs  Lab 01/18/18 0656 01/24/18 1010  WBC 14.1* 17.9*  NEUTROABS 11.8* 16.2*  HGB 12.6 14.8  HCT 43.1 46.7*  MCV 83.9 80.4  PLT 104* 829   Basic Metabolic Panel: Recent Labs  Lab 01/18/18 0656 01/24/18 1010  NA 135 136  K 3.1* 3.2*  CL 94* 91*  CO2 35* 32  GLUCOSE 119* 84  BUN 18 22*  CREATININE 0.64 0.87  CALCIUM 8.1* 8.8*  MG 1.1*  --    GFR: Estimated Creatinine Clearance: 64.7 mL/min (by C-G formula based on SCr of 0.87 mg/dL).   Liver Function Tests: Recent Labs  Lab 01/24/18 1010  AST 83*  ALT 89*  ALKPHOS 121  BILITOT 2.7*  PROT 7.7  ALBUMIN 3.5   Coagulation Profile: Recent Labs  Lab 01/24/18 1047  INR 3.78   Cardiac Enzymes: Recent Labs  Lab 01/24/18 1010  TROPONINI 0.03*   Urine analysis:    Component Value Date/Time   COLORURINE YELLOW 01/13/2018 2015   APPEARANCEUR CLEAR 01/13/2018 2015   LABSPEC 1.012 01/13/2018 2015   PHURINE 6.0 01/13/2018 2015   GLUCOSEU NEGATIVE 01/13/2018 2015   HGBUR NEGATIVE 01/13/2018 2015   HGBUR negative 02/26/2008 1027   BILIRUBINUR NEGATIVE 01/13/2018 2015   KETONESUR NEGATIVE 01/13/2018 2015   PROTEINUR NEGATIVE 01/13/2018 2015   UROBILINOGEN 0.2 06/20/2012 1231   NITRITE NEGATIVE 01/13/2018 2015   LEUKOCYTESUR NEGATIVE 01/13/2018 2015    Recent Results (from the past 240 hour(s))  MRSA PCR Screening     Status: None   Collection Time: 01/14/18  6:17 PM  Result Value Ref Range Status   MRSA by PCR NEGATIVE NEGATIVE Final    Comment:        The GeneXpert MRSA Assay (FDA approved for NASAL specimens only), is one component of a comprehensive MRSA colonization surveillance program. It is not intended to diagnose MRSA infection nor to guide or monitor treatment for MRSA infections. Performed at [facility], [address], [place], [place] [address]   Culture, body  fluid-bottle     Status: None   Collection Time: 01/17/18  1:32 PM  Result Value Ref Range Status   Specimen Description PLEURAL  Final   Special Requests BOTTLES DRAWN AEROBIC AND ANAEROBIC 10 CC EACH  Final   Culture   Final    NO GROWTH 5 DAYS Performed at [facility], [address], [place], [place] [address]    Report Status 01/22/2018 FINAL  Final  Gram stain     Status: None   Collection Time: 01/17/18  1:33 PM  Result Value Ref Range Status   Specimen Description PLEURAL  Final   Special Requests NONE  Final   Gram Stain   Final    NO ORGANISMS SEEN CYTOSPIN SMEAR WBC PRESENT,BOTH PMN AND MONONUCLEAR Performed at [facility], [address], [place], [place] [address]    Report Status 01/17/2018 FINAL  Final     Radiological Exams on Admission: Dg Chest Port 1 View  Result Date: 01/24/2018 CLINICAL DATA:  Shortness of breath. History of breast cancer. COPD. Prior radiation therapy to right lung. Right lung cancer. EXAM: PORTABLE CHEST 1 VIEW COMPARISON:  01/17/2018 FINDINGS: Left Port-A-Cath in place with the tip in the SVC. Chronic density in the right upper lobe is stable since prior study, likely postradiation changes. Small right pleural effusion and right base atelectasis. No confluent opacity on the left. Heart is borderline in size. IMPRESSION: Stable chronic changes in the right upper lobe, likely postradiation changes. Small right effusion with right base atelectasis. Electronically Signed   By: Rolm Baptise M.D.   On: 01/24/2018 10:29    EKG: Independently reviewed.  Sinus tachycardia; no acute ischemic changes.  Assessment/Plan 1-acute on chronic respiratory failure with hypoxia: In the setting of acute on chronic diastolic heart failure, COPD exacerbation.   -Admitted to telemetry -Follow daily weights and his treat intake and output -Low-sodium diet -IV Lasix therapy has been initiated; given significant fluid buildup we will also give 2 dose of  metolazone -Continue oxygen supplementation and follow clinical response. -Troponin mildly elevated in the setting of demand ischemia with CHF exacerbation -2D echo has been reordered. -Continue beta-blocker and ARB started.  2-COPD exacerbation -Patient started on Solu-Medrol, DuoNeb, Pulmicort and flutter valve. -Continue oxygen supplementation and wean down as tolerated to her baseline  3-GERD -Continue PPI  4-TOBACCO ABUSE -I  have discussed tobacco cessation with the patient.  I have counseled the patient regarding the negative impacts of continued tobacco use including but not limited to lung cancer, COPD, and cardiovascular disease.  I have discussed alternatives to tobacco and modalities that may help facilitate tobacco cessation including but not limited to biofeedback, hypnosis, and medications.  Total time spent with tobacco counseling was 5 minutes -patient not ready to quit; she expressed is to late anyway. -nicotine patch ordered   5-Sleep apnea: -Will use CPAP at bedtime.  6-Hypokalemia -Check magnesium level -Start daily supplementation -Repeat be met in a.m. to follow electrolytes trend.  7-Stage III squamous cell carcinoma of right lung ([place]): With recurrent pleural effusion. -Actively followed by oncology as an outpatient -Overall poor prognosis -Patient is still actively smoking -Palliative care has been consulted. -She has expressed to me that she understand her condition is not curable; and that she is not really enjoying this multiple admissions to the hospital. -There is no significant fluid accumulation in her pleural space -She is DNR/DNI  8-Hypothyroidism -Check TSH -Continue Synthroid.   DVT prophylaxis: chronically on xarelto  Code Status: DNR/DNI Family Communication: no family at bedside  Disposition Plan: to be determined; follow [place] meeting and clinical response.  Consults called: palliative care Admission status: inpatient, LOS > 2  midnights    Time Spent: 70 minutes  Barton Dubois MD [facility] Pager [phone]  If 7PM-7AM, please contact night-coverage [email or web address] Password [email or web address]  01/24/2018, 4:14 PM

## 2018-01-25 ENCOUNTER — Inpatient Hospital Stay (HOSPITAL_COMMUNITY): Payer: Medicaid Other

## 2018-01-25 DIAGNOSIS — C3491 Malignant neoplasm of unspecified part of right bronchus or lung: Secondary | ICD-10-CM

## 2018-01-25 DIAGNOSIS — J9621 Acute and chronic respiratory failure with hypoxia: Secondary | ICD-10-CM

## 2018-01-25 DIAGNOSIS — F17200 Nicotine dependence, unspecified, uncomplicated: Secondary | ICD-10-CM

## 2018-01-25 DIAGNOSIS — E876 Hypokalemia: Secondary | ICD-10-CM

## 2018-01-25 DIAGNOSIS — I5033 Acute on chronic diastolic (congestive) heart failure: Secondary | ICD-10-CM

## 2018-01-25 DIAGNOSIS — Z515 Encounter for palliative care: Secondary | ICD-10-CM

## 2018-01-25 DIAGNOSIS — Z7189 Other specified counseling: Secondary | ICD-10-CM

## 2018-01-25 LAB — BASIC METABOLIC PANEL
ANION GAP: 9 (ref 5–15)
BUN: 19 mg/dL (ref 6–20)
CALCIUM: 8 mg/dL — AB (ref 8.9–10.3)
CO2: 43 mmol/L — ABNORMAL HIGH (ref 22–32)
CREATININE: 0.92 mg/dL (ref 0.44–1.00)
Chloride: 88 mmol/L — ABNORMAL LOW (ref 101–111)
Glucose, Bld: 106 mg/dL — ABNORMAL HIGH (ref 65–99)
Potassium: 2.3 mmol/L — CL (ref 3.5–5.1)
SODIUM: 140 mmol/L (ref 135–145)

## 2018-01-25 MED ORDER — POTASSIUM CHLORIDE 10 MEQ/100ML IV SOLN
10.0000 meq | INTRAVENOUS | Status: AC
  Administered 2018-01-25 (×2): 10 meq via INTRAVENOUS
  Filled 2018-01-25 (×2): qty 100

## 2018-01-25 MED ORDER — POTASSIUM CHLORIDE CRYS ER 20 MEQ PO TBCR
40.0000 meq | EXTENDED_RELEASE_TABLET | Freq: Two times a day (BID) | ORAL | Status: DC
  Administered 2018-01-26 – 2018-01-27 (×3): 40 meq via ORAL
  Filled 2018-01-25 (×3): qty 2

## 2018-01-25 MED ORDER — ENOXAPARIN SODIUM 80 MG/0.8ML ~~LOC~~ SOLN
1.0000 mg/kg | Freq: Two times a day (BID) | SUBCUTANEOUS | Status: DC
  Administered 2018-01-25 – 2018-01-26 (×2): 65 mg via SUBCUTANEOUS
  Filled 2018-01-25 (×2): qty 0.8

## 2018-01-25 MED ORDER — POTASSIUM CHLORIDE CRYS ER 20 MEQ PO TBCR
40.0000 meq | EXTENDED_RELEASE_TABLET | Freq: Four times a day (QID) | ORAL | Status: AC
  Administered 2018-01-25 (×3): 40 meq via ORAL
  Filled 2018-01-25 (×3): qty 2

## 2018-01-25 MED ORDER — GUAIFENESIN 100 MG/5ML PO SOLN
5.0000 mL | ORAL | Status: DC | PRN
  Administered 2018-01-25: 100 mg via ORAL
  Filled 2018-01-25: qty 5

## 2018-01-25 NOTE — Progress Notes (Signed)
Patient heart too high, attempt later.

## 2018-01-25 NOTE — Progress Notes (Signed)
Daily Progress Note   Patient Name: Sydney Blair       Date: 01/25/2018 DOB: 05-Mar-1956  Age: 62 y.o. MRN#: [id number] Attending Physician: Orson Eva, MD Primary Care Physician: [name], MD Admit Date: 01/24/2018  Reason for Consultation/Follow-up: Establishing goals of care and Psychosocial/spiritual support  Subjective: Sydney Blair is sitting quietly in bed.  She looks at me making and briefly keeping eye contact.  There is no family at bedside at this time.  She does have respiratory therapy at bedside with breathing treatment via nebulizer administered.  We talked briefly about her condition.  She appears very weak and frail.  We briefly talked about her bruised knee, she tells me she does not know how that happened.  We also briefly talked about smoking cessation, but by this point she is unable to continue to speak due to the breathing treatment and her respiratory status.  I share that I will return later.  Conference with nursing staff related to plan of care.  Length of Stay: 1  Current Medications: Scheduled Meds:  . budesonide (PULMICORT) nebulizer solution  0.5 mg Nebulization BID  . DULoxetine  60 mg Oral Daily  . furosemide  40 mg Intravenous Q12H  . gabapentin  600 mg Oral QID  . ipratropium-albuterol  3 mL Nebulization QID  . irbesartan  75 mg Oral Daily  . levothyroxine  100 mcg Oral QAC breakfast  . methylPREDNISolone (SOLU-MEDROL) injection  40 mg Intravenous Q12H  . metolazone  2.5 mg Oral Daily  . metoprolol tartrate  75 mg Oral BID  . nicotine  21 mg Transdermal Daily  . pantoprazole  40 mg Oral Daily  . potassium chloride  40 mEq Oral Q6H  . [START ON 01/26/2018] potassium chloride  40 mEq Oral BID  . rivaroxaban  20 mg Oral Q supper  . rosuvastatin   5 mg Oral q1800  . sodium chloride flush  3 mL Intravenous Q12H  . tapentadol  100 mg Oral Q12H    Continuous Infusions: . sodium chloride 250 mL (01/25/18 0822)    PRN Meds: sodium chloride, acetaminophen, guaiFENesin, meclizine, methocarbamol, ondansetron (ZOFRAN) IV, sodium chloride flush  Physical Exam  Constitutional: She is oriented to person, place, and time. No distress.  Appears acutely/chronically ill, makes and briefly keeps eye contact.  HENT:  Head: Atraumatic.  Cardiovascular: Normal rate.  Pulmonary/Chest:  Some work of breathing noted, breathing treatment in place at this time.  Abdominal: Soft.  Musculoskeletal: She exhibits no edema.  Neurological: She is alert and oriented to person, place, and time.  Skin: Skin is warm and dry.  Multiple areas of bruising, especially right knee  Psychiatric:  Calm and cooperative  Nursing note and vitals reviewed.           Vital Signs: BP 128/72 (BP Location: Left Arm)   Pulse 78   Temp 98.2 F (36.8 C) (Oral)   Resp 18   Wt 64.3 kg (141 lb 12.1 oz)   SpO2 91%   BMI 25.11 kg/m  SpO2: SpO2: 91 % O2 Device: O2 Device: Nasal Cannula O2 Flow Rate: O2 Flow Rate (L/min): 3 L/min  Intake/output summary:   Intake/Output Summary (Last 24 hours) at 01/25/2018 1257 Last data filed at 01/25/2018 0500 Gross per 24 hour  Intake 720 ml  Output 2650 ml  Net -1930 ml   LBM: Last BM Date: 01/24/18 Baseline Weight: Weight: 70.3 kg (155 lb) Most recent weight: Weight: 64.3 kg (141 lb 12.1 oz)       Palliative Assessment/Data:      Patient Active Problem List   Diagnosis Date Noted  . Acute on chronic diastolic HF (heart failure) ([place]) 01/24/2018  . Acute on chronic respiratory failure with hypoxia ([place]) 01/24/2018  . DNR (do not resuscitate) discussion   . Palliative care by specialist   . Shortness of breath 01/13/2018  . Pleural effusion 01/13/2018  . Malignant neoplasm of right lung ([place])   . Hypothyroidism  06/19/2017  . Hypomagnesemia 06/19/2017  . Encounter for antineoplastic immunotherapy 06/06/2017  . Chronic fatigue 06/06/2017  . Community acquired pneumonia 05/26/2017  . Pressure injury of skin 05/18/2017  . Hepatitis 05/16/2017  . Nausea vomiting and diarrhea 05/16/2017  . SIRS (systemic inflammatory response syndrome) ([place]) 05/16/2017  . COPD (chronic obstructive pulmonary disease) ([place]) 05/16/2017  . Abnormal CXR 05/16/2017  . Anemia 05/16/2017  . Thrombocytopenia ([place]) 05/16/2017  . Depression 05/16/2017  . Anxiety 05/16/2017  . Chronic pain 05/16/2017  . Chronic respiratory failure with hypoxia ([place]) 05/16/2017  . Stage III squamous cell carcinoma of right lung ([place]) 03/10/2017  . Goals of care, counseling/discussion 03/10/2017  . Encounter for antineoplastic chemotherapy 03/10/2017  . Lung mass 02/01/2017  . Breast mass, right   . Hx of adenomatous colonic polyps 12/28/2016  . Fracture of fifth metatarsal bone of right foot with delayed healing 02/03/2016  . IBS (irritable bowel syndrome) 07/30/2015  . Diarrhea 10/29/2014  . Hoarseness 10/29/2014  . Chronic hoarseness 01/16/2014  . Other dysphagia 01/16/2014  . Dysphagia, unspecified(787.20) 06/04/2013  . Unspecified constipation 06/04/2013  . Unsteady gait 01/25/2013  . COPD (chronic obstructive pulmonary disease) with emphysema ([place]) 12/26/2012  . DOE (dyspnea on exertion) 12/01/2012  . Respiratory bronchiolitis associated interstitial lung disease ([place]) 12/01/2012  . Dysphagia 11/09/2012  . COPD exacerbation ([place]) 08/07/2011  . Hypokalemia 08/07/2011  . Myositis 08/07/2011  . Dehydration 08/07/2011  . ARF (acute renal failure) ([place]) 08/07/2011  . Tubular adenoma 07/20/2011  . INSOMNIA 02/26/2008  . CERUMEN IMPACTION, RIGHT 01/23/2008  . 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 NECK PAIN 11/22/2007  . HAMMER TOE 11/22/2007  . ANXIETY 01/30/2007  . TOBACCO ABUSE 01/30/2007  . DEPRESSION 01/30/2007  . GERD  01/30/2007  . PROLAPSE, VAGINAL WALL, CYSTOCELE, MIDLINE 01/10/2007  . INCONTINENCE 01/10/2007  . SEBACEOUS CYST  08/17/2006  . Breast cancer, right breast ([place]) 12/01/2003    Palliative Care Assessment & Plan   Patient Profile: 62 y.o. 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  She has had 2 hospital admissions in the last 2 weeks and 2 ED visits in 6 months, she was just discharged 5/29.  Assessment: Acute on chronic respiratory failure with hypoxia in the setting of acute on chronic heart failure and COPD exacerbation.  IV Lasix therapy provided, oxygen supplementation with breathing treatments, Solu-Medrol.  Follow clinical course/outcomes. tobacco cessation counseling per admitting hospitalist -patient not ready to quit; she expressed is to late anyway. -nicotine patch ordered  Recommendations/Plan:  At this point, continue to treat the treatable.  No CPR, no intubation.  Discussions related to hospice services  Goals of Care and Additional Recommendations:  Limitations on Scope of Treatment: Treat the treatable but no CPR, no intubation.  Code Status:    Code Status Orders  (From admission, onward)        Start     Ordered   01/24/18 1606  Do not attempt resuscitation (DNR)  Continuous    Question Answer Comment  In the event of cardiac or respiratory ARREST Do not call a "code blue"   In the event of cardiac or respiratory ARREST Do not perform Intubation, CPR, defibrillation or ACLS   In the event of cardiac or respiratory ARREST Use medication by any route, position, wound care, and other measures to relive pain and  suffering. May use oxygen, suction and manual treatment of airway obstruction as needed for comfort.   Comments patient expressed no interest in any kind of resuscitation; includding not use of BIPAP      01/24/18 1613    Code Status History    Date Active Date Inactive Code Status Order ID Comments User Context   01/14/2018 2106 01/18/2018 1602 DNR [id number]  Heath Lark D, DO Inpatient   01/13/2018 2210 01/14/2018 2106 Full Code [id number]  Truett Mainland, DO ED   06/19/2017 0336 06/21/2017 1301 Full Code [id number]  Reubin Milan, MD Inpatient   05/16/2017 2223 05/21/2017 1745 Full Code [id number]  Elodia Florence., MD ED   05/16/2017 2215 05/16/2017 2222 Full Code [id number]  Omar Person, NP ED   08/06/2011 1810 08/08/2011 1349 Full Code [id number]  Delfina Redwood, MD Inpatient       Prognosis:   Unable to determine based on outcomes.  3-6 months or less would not be surprising based on history of lung cancer, continued tobacco use, complicated by relatively young age at 38.  Discharge Planning:  To Be Determined, recommend SNF rehab.   Care plan was discussed with nursing staff, Dr. Carles Collet, respiratory therapy.   Thank you for allowing the Palliative Medicine Team to assist in the care of this patient.   Time In: 1050 Time Out: 1110 Total Time 20 minutes Prolonged Time Billed  no       Greater than 50%  of this time was spent counseling and coordinating care related to the above assessment and plan.  Drue Novel, NP  Please contact Palliative Medicine Team phone at [phone] for questions and concerns.

## 2018-01-25 NOTE — Progress Notes (Signed)
CRITICAL VALUE ALERT  Critical Value:  K 2.3  Date & Time Notied:  01/25/18 0702   Provider Notified: TAT MD  Orders Received/Actions taken: AWAITING RESPONSE

## 2018-01-25 NOTE — Progress Notes (Signed)
Patient's hr still 125. Attempt tomorrow. Informed RN.

## 2018-01-25 NOTE — Progress Notes (Signed)
Pt had o2 out her nose and her spo2 85% o2 back on when treatments finished. Spo2 increased to 95% with treatments

## 2018-01-25 NOTE — Progress Notes (Signed)
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  The patient was recently admitted to the hospital from 01/13/2018 through 01/18/2018 during which she was treated for COPD exacerbation and acute on chronic diastolic CHF.  The patient also had a right-sided thoracocentesis removing 1.3 L of fluid.  The patient was discharged home on her usual 2 L of nasal cannula and a prednisone taper.  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 Delton Coombes  Hypokalemia -Replete -check mag  LUE DVT -change to lovenox temporarily if pt should need any invasive intervention -Diagnosed March 2019  Sinus tachycardia -01/13/2018 CTA chest neg for PE -continue metoprolol -TSH 2.640 -likely due acute medical illness in setting of lung cancer -Personally reviewed EKG sinus rhythm, nonspecific ST changes  Tobacco abuse -tobacco cessation discussed    Disposition Plan:   Home in 2-3 days  Family Communication:  No Family at bedside  Consultants:   Palliative med  Code Status:  FULL   DVT Prophylaxis:  lovenox   Procedures: As Listed in Progress Note Above  Antibiotics: None    Subjective: Patient states that she is breathing better.  She feels that her leg edema is improving.  She denies any nausea, vomiting, abdominal pain, chest pain, shortness breath, headache, neck pain.  Objective: Vitals:   01/25/18 1201 01/25/18 1405 01/25/18 1600 01/25/18 1657  BP:  (!) 77/62 96/77   Pulse:  (!) 122 (!) 125   Resp:  14    Temp:  98.1 F (36.7 C)    TempSrc:  Oral    SpO2: 91% 97% 93% 90%  Weight:        Intake/Output Summary (Last 24 hours) at 01/25/2018 1833 Last data filed at 01/25/2018 1516 Gross per 24 hour  Intake 789 ml  Output 1750 ml  Net -961 ml   Weight change:  Exam:   General:  Pt is alert, follows commands appropriately, not in acute distress  HEENT: No icterus, No thrush, No neck mass, [place]/AT  Cardiovascular: RRR, S1/S2, no rubs, no gallops  Respiratory: Bibasilar crackles.  biLateral expiratory wheeze.  Abdomen: Soft/+BS, non tender, non distended, no guarding  Extremities: 2 + LE edema, No lymphangitis, No petechiae, No rashes, no synovitis   Data Reviewed: I have personally reviewed following labs and imaging studies Basic Metabolic Panel: Recent Labs  Lab 01/24/18 1010 01/25/18 0630  NA 136 140  K 3.2* 2.3*  CL 91* 88*  CO2 32 43*  GLUCOSE 84 106*  BUN 22* 19  CREATININE 0.87 0.92  CALCIUM 8.8* 8.0*  MG 1.5*  --    Liver Function Tests: Recent Labs  Lab 01/24/18 1010  AST  83*  ALT 89*  ALKPHOS 121  BILITOT 2.7*  PROT 7.7  ALBUMIN 3.5   No results for input(s): LIPASE, AMYLASE in the last 168 hours. No results for input(s): AMMONIA in the last 168 hours. Coagulation Profile: Recent Labs  Lab 01/24/18 1047  INR 3.78   CBC: Recent Labs  Lab 01/24/18 1010  WBC 17.9*  NEUTROABS 16.2*  HGB 14.8  HCT 46.7*  MCV 80.4  PLT 176   Cardiac Enzymes: Recent Labs  Lab  01/24/18 1010  TROPONINI 0.03*   BNP: Invalid input(s): POCBNP CBG: No results for input(s): GLUCAP in the last 168 hours. HbA1C: No results for input(s): HGBA1C in the last 72 hours. Urine analysis:    Component Value Date/Time   COLORURINE YELLOW 01/13/2018 2015   APPEARANCEUR CLEAR 01/13/2018 2015   LABSPEC 1.012 01/13/2018 2015   PHURINE 6.0 01/13/2018 2015   GLUCOSEU NEGATIVE 01/13/2018 2015   HGBUR NEGATIVE 01/13/2018 2015   HGBUR negative 02/26/2008 [place] 01/13/2018 2015   KETONESUR NEGATIVE 01/13/2018 2015   PROTEINUR NEGATIVE 01/13/2018 2015   UROBILINOGEN 0.2 06/20/2012 1231   NITRITE NEGATIVE 01/13/2018 2015   LEUKOCYTESUR NEGATIVE 01/13/2018 2015   Sepsis Labs: @LABRCNTIP (procalcitonin:4,lacticidven:4) ) Recent Results (from the past 240 hour(s))  Culture, body fluid-bottle     Status: None   Collection Time: 01/17/18  1:32 PM  Result Value Ref Range Status   Specimen Description PLEURAL  Final   Special Requests BOTTLES DRAWN AEROBIC AND ANAEROBIC 10 CC EACH  Final   Culture   Final    NO GROWTH 5 DAYS Performed at [facility], [address], [place], [place] [address]    Report Status 01/22/2018 FINAL  Final  Gram stain     Status: None   Collection Time: 01/17/18  1:33 PM  Result Value Ref Range Status   Specimen Description PLEURAL  Final   Special Requests NONE  Final   Gram Stain   Final    NO ORGANISMS SEEN CYTOSPIN SMEAR WBC PRESENT,BOTH PMN AND MONONUCLEAR Performed at [facility], [address], [place], [place] [address]    Report Status 01/17/2018 FINAL  Final     Scheduled Meds: . budesonide (PULMICORT) nebulizer solution  0.5 mg Nebulization BID  . DULoxetine  60 mg Oral Daily  . enoxaparin (LOVENOX) injection  1 mg/kg Subcutaneous Q12H  . furosemide  40 mg Intravenous Q12H  . gabapentin  600 mg Oral QID  . ipratropium-albuterol  3 mL Nebulization QID  . levothyroxine  100 mcg Oral QAC breakfast  .  methylPREDNISolone (SOLU-MEDROL) injection  40 mg Intravenous Q12H  . metolazone  2.5 mg Oral Daily  . metoprolol tartrate  75 mg Oral BID  . nicotine  21 mg Transdermal Daily  . pantoprazole  40 mg Oral Daily  . [START ON 01/26/2018] potassium chloride  40 mEq Oral BID  . rosuvastatin  5 mg Oral q1800  . sodium chloride flush  3 mL Intravenous Q12H  . tapentadol  100 mg Oral Q12H   Continuous Infusions: . sodium chloride 250 mL (01/25/18 0822)    Procedures/Studies: Dg Chest 1 View  Result Date: 01/17/2018 CLINICAL DATA:  Post right thoracentesis EXAM: CHEST  1 VIEW COMPARISON:  01/14/2018 FINDINGS: Decreasing right effusion following thoracentesis. No pneumothorax. Persistent right upper lobe perihilar opacity and right apical pleural thickening. Left Port-A-Cath remains in place, unchanged. Mild cardiomegaly. IMPRESSION: Decreasing right effusion following thoracentesis.  No pneumothorax. Electronically Signed   By:  Rolm Baptise M.D.   On: 01/17/2018 14:37   Dg Chest 2 View  Result Date: 01/13/2018 CLINICAL DATA:  Shortness of breath. History of right lung carcinoma. History of breast carcinoma EXAM: CHEST - 2 VIEW COMPARISON:  PET-CT November 02, 2017 and chest radiograph November 25, 2017 FINDINGS: There is extensive scarring with volume loss in the right upper lobe. There is a stable area of apparent cavitation in the right upper lobe region. There is elevation of the right hemidiaphragm with blunting of the right costophrenic angle, likely due to scarring. Left lung is clear. Heart size is normal. There is distortion of pulmonary vascularity on the right. Pulmonary vascularity on the left is normal. No adenopathy is demonstrable by radiography. Port-A-Cath tip is in the superior vena cava. No pneumothorax. No evident bone lesions. IMPRESSION: Extensive scarring with volume loss right upper lobe. Milder scarring noted right base. Chronic blunting of the right costophrenic angle noted. Stable area  of cavitation right upper lobe posteriorly. These changes may be due to previous radiation therapy. No progression of cavitation in the right upper lobe. No new opacity. Left lung is clear. Stable cardiac silhouette. Port-A-Cath tip in superior vena cava. No evident pneumothorax. Electronically Signed   By: Lowella Grip III M.D.   On: 01/13/2018 15:51   Ct Head Wo Contrast  Result Date: 01/14/2018 CLINICAL DATA:  Altered mental status EXAM: CT HEAD WITHOUT CONTRAST TECHNIQUE: Contiguous axial images were obtained from the base of the skull through the vertex without intravenous contrast. COMPARISON:  06/20/2017 FINDINGS: Brain: There is no mass, hemorrhage or extra-axial collection. 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 The brain parenchyma is otherwise normal. The size and configuration of the ventricles and extra-axial CSF spaces are normal. Vascular: No hyperdense vessel or unexpected vascular calcification. Skull: Small osteoma near the right pterion. Sinuses/Orbits: No fluid levels or advanced mucosal thickening of the visualized paranasal sinuses. No mastoid or middle ear effusion. The orbits are normal. IMPRESSION: No acute abnormality. 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 Electronically Signed   By: Ulyses Jarred M.D.   On: 01/14/2018 22:47   Ct Angio Chest Pe W And/or Wo Contrast  Result Date: 01/13/2018 CLINICAL DATA:  Lung cancer, on chemotherapy. Worsening shortness of breath. EXAM: CT ANGIOGRAPHY CHEST WITH CONTRAST TECHNIQUE: Multidetector CT imaging of the chest was performed using the standard protocol during bolus administration of intravenous contrast. Multiplanar CT image reconstructions and MIPs were obtained to evaluate the vascular anatomy. CONTRAST:  142mL ISOVUE-370 IOPAMIDOL (ISOVUE-370) INJECTION 76% COMPARISON:  PET-CT from 11/02/2017. FINDINGS: Cardiovascular: Mildly reduced sensitivity in assessing for pulmonary  embolus due to motion artifact. No filling defect is identified in the pulmonary arterial tree to suggest pulmonary embolus. Reduction in size of elements of the right pulmonary arterial tree likely attributable to hypoventilation related vaso constriction. Aortoiliac atherosclerotic vascular disease. Mild right heart enlargement. Mediastinum/Nodes: Progressive infiltration in density in the mediastinal adipose tissues could be from infiltrative tumor, mediastinal edema, or mediastinitis. The mild adenopathy shown on the prior exam is still present but is indistinctly marginated. Distended right jugular vein. No pericardial effusion. Lungs/Pleura: Progressive loculated pleural effusion on the right side; malignant effusion or empyema not excluded. Cavitary lesion in the right mid lung is probably in the upper lobe and contains an air-fluid level, measuring about 4.6 cm transverse, roughly similar to the prior exam. This could be a cavitary mass or cavitary pneumonia, the persistence favors the former. Increased right perihilar  airspace opacity. Right paramediastinal airspace opacity and/or loculated fluid is increased. Paraseptal and centrilobular emphysema. Upper Abdomen: Extensive new ascites. Chronic left hydronephrosis. Aortoiliac atherosclerotic vascular disease. Musculoskeletal: Old right rib fractures, some nonunited, stable. Thoracic spondylosis. Review of the MIP images confirms the above findings. IMPRESSION: 1. No acute pulmonary embolus identified. 2. Notable and increased right perihilar and right paramediastinal airspace opacity, query tumor, pneumonia, or radiation pneumonitis. 3. Current moderate to large loculated right pleural effusion is new compared to 11/02/2017. Malignant effusion or empyema not excluded. There is also extensive new ascites and fluid infiltration of the mediastinum obscuring fatty tissues, which may be from third spacing of fluid, mediastinitis, or infiltrative tumor. No  pericardial effusion. 4. Mild right heart enlargement. 5. Cavitary mass or cavitary pneumonia in the right upper lobe, essentially stable from June (which favors a mass). 6. Aortic Atherosclerosis (ICD10-I70.0) and Emphysema (ICD10-J43.9). 7. Chronic left hydronephrosis. Electronically Signed   By: Van Clines M.D.   On: 01/13/2018 19:20   Dg Chest Port 1 View  Result Date: 01/24/2018 CLINICAL DATA:  Shortness of breath. History of breast cancer. COPD. Prior radiation therapy to right lung. Right lung cancer. EXAM: PORTABLE CHEST 1 VIEW COMPARISON:  01/17/2018 FINDINGS: Left Port-A-Cath in place with the tip in the SVC. Chronic density in the right upper lobe is stable since prior study, likely postradiation changes. Small right pleural effusion and right base atelectasis. No confluent opacity on the left. Heart is borderline in size. IMPRESSION: Stable chronic changes in the right upper lobe, likely postradiation changes. Small right effusion with right base atelectasis. Electronically Signed   By: Rolm Baptise M.D.   On: 01/24/2018 10:29   Dg Chest Port 1 View  Result Date: 01/14/2018 CLINICAL DATA:  Respiratory failure EXAM: PORTABLE CHEST 1 VIEW COMPARISON:  Chest radiograph and chest CT Jan 13, 2018 FINDINGS: There is partially loculated effusion on the right with areas of consolidation in the right perihilar and lower lobe regions. The area cavitation noted on CT 1 day prior is not appreciable on this portable chest radiographic examination. There is atelectatic change in the left perihilar region. No frank consolidation is noted on the left. Note that a small portion of the left base is not seen. Heart is upper normal in size with pulmonary vascularity within normal limits. There is aortic atherosclerosis. Port-A-Cath tip is in the superior vena cava. There is apparent adenopathy in the right paratracheal and right hilar regions. IMPRESSION: Extensive consolidation in portions of the right lung,  most notably in the right perihilar and lower lobe regions. Partially loculated effusions seen throughout much the right hemithorax. There is adenopathy in the right hilar and paratracheal regions. Note that area cavitation noted on CT 1 day prior is not appreciable by frontal portable radiography. There is perihilar atelectasis on the left. Heart size normal. There is aortic atherosclerosis. Port-A-Cath tip in superior vena cava. No pneumothorax. Aortic Atherosclerosis (ICD10-I70.0). Electronically Signed   By: Lowella Grip III M.D.   On: 01/14/2018 17:46   US Thoracentesis Asp Pleural Space W/img Guide  Result Date: 01/17/2018 INDICATION: Right pleural effusion EXAM: ULTRASOUND GUIDED RIGHT THORACENTESIS MEDICATIONS: None. COMPLICATIONS: None immediate. PROCEDURE: An ultrasound guided thoracentesis was thoroughly discussed with the patient and questions answered. The benefits, risks, alternatives and complications were also discussed. The patient understands and wishes to proceed with the procedure. Written consent was obtained. Ultrasound was performed to localize and mark an adequate pocket of fluid in the right posterior chest. The  area was then prepped and draped in the normal sterile fashion. 1% Lidocaine was used for local anesthesia. Under ultrasound guidance a 19 gauge, 7-cm, Yueh catheter was introduced. Thoracentesis was performed. The catheter was removed and a dressing applied. FINDINGS: A total of approximately 1.3 liters of yellow fluid was removed. Samples were sent to the laboratory as requested by the clinical team. IMPRESSION: Successful ultrasound guided right thoracentesis yielding 1.3 liters of pleural fluid. Electronically Signed   By: Rolm Baptise M.D.   On: 01/17/2018 14:37    Orson Eva, DO  [facility] Pager [phone]  If 7PM-7AM, please contact night-coverage [email or web address] Password [email or web address] 01/25/2018, 6:33 PM   LOS: 1 day

## 2018-01-26 ENCOUNTER — Inpatient Hospital Stay (HOSPITAL_COMMUNITY): Payer: Medicaid Other

## 2018-01-26 DIAGNOSIS — J90 Pleural effusion, not elsewhere classified: Secondary | ICD-10-CM

## 2018-01-26 LAB — BASIC METABOLIC PANEL
ANION GAP: 12 (ref 5–15)
BUN: 22 mg/dL — ABNORMAL HIGH (ref 6–20)
CO2: 40 mmol/L — ABNORMAL HIGH (ref 22–32)
CREATININE: 0.95 mg/dL (ref 0.44–1.00)
Calcium: 8 mg/dL — ABNORMAL LOW (ref 8.9–10.3)
Chloride: 86 mmol/L — ABNORMAL LOW (ref 101–111)
GFR calc Af Amer: >60 mL/min (ref >60)
GLUCOSE: 197 mg/dL — AB (ref 65–99)
Potassium: 3.2 mmol/L — ABNORMAL LOW (ref 3.5–5.1)
Sodium: 138 mmol/L (ref 135–145)

## 2018-01-26 LAB — CBC
HCT: 40.4 % (ref 36.0–46.0)
Hemoglobin: 12.1 g/dL (ref 12.0–15.0)
MCH: 24.8 pg — ABNORMAL LOW (ref 26.0–34.0)
MCHC: 30 g/dL (ref 30.0–36.0)
MCV: 82.8 fL (ref 78.0–100.0)
Platelets: 185 10*3/uL (ref 150–400)
RBC: 4.88 MIL/uL (ref 3.87–5.11)
RDW: 20.1 % — AB (ref 11.5–15.5)
WBC: 16.9 10*3/uL — ABNORMAL HIGH (ref 4.0–10.5)

## 2018-01-26 MED ORDER — IPRATROPIUM BROMIDE 0.02 % IN SOLN
0.5000 mg | Freq: Four times a day (QID) | RESPIRATORY_TRACT | Status: DC
  Administered 2018-01-26: 0.5 mg via RESPIRATORY_TRACT
  Filled 2018-01-26: qty 2.5

## 2018-01-26 MED ORDER — LEVALBUTEROL HCL 1.25 MG/0.5ML IN NEBU
1.2500 mg | INHALATION_SOLUTION | Freq: Four times a day (QID) | RESPIRATORY_TRACT | Status: DC
  Administered 2018-01-27 (×2): 1.25 mg via RESPIRATORY_TRACT
  Filled 2018-01-26 (×2): qty 0.5

## 2018-01-26 MED ORDER — IPRATROPIUM BROMIDE 0.02 % IN SOLN
0.5000 mg | Freq: Four times a day (QID) | RESPIRATORY_TRACT | Status: DC
  Administered 2018-01-27 (×2): 0.5 mg via RESPIRATORY_TRACT
  Filled 2018-01-26 (×2): qty 2.5

## 2018-01-26 MED ORDER — RIVAROXABAN 20 MG PO TABS
20.0000 mg | ORAL_TABLET | Freq: Every day | ORAL | Status: DC
  Administered 2018-01-26: 20 mg via ORAL
  Filled 2018-01-26: qty 1

## 2018-01-26 MED ORDER — FUROSEMIDE 40 MG PO TABS
40.0000 mg | ORAL_TABLET | Freq: Two times a day (BID) | ORAL | Status: DC
  Administered 2018-01-26 – 2018-01-27 (×2): 40 mg via ORAL
  Filled 2018-01-26 (×2): qty 1

## 2018-01-26 MED ORDER — LEVALBUTEROL HCL 1.25 MG/0.5ML IN NEBU
1.2500 mg | INHALATION_SOLUTION | Freq: Four times a day (QID) | RESPIRATORY_TRACT | Status: DC
  Administered 2018-01-26: 1.25 mg via RESPIRATORY_TRACT
  Filled 2018-01-26: qty 0.5

## 2018-01-26 NOTE — Progress Notes (Signed)
Patients  HR 136. MD made aware by Lonn Georgia, RN. Will check again later.

## 2018-01-26 NOTE — Progress Notes (Signed)
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  The patient was recently admitted to the hospital from 01/13/2018 through 01/18/2018 during which she was treated for COPD exacerbation and acute on chronic diastolic CHF.  The patient also had a right-sided thoracocentesis removing 1.3 L of fluid.  The patient was discharged home on her usual 2 L of nasal cannula and a prednisone taper.  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 Delton Coombes  Hypokalemia -Replete -check mag  LUE DVT -change to lovenox temporarily if pt should need any invasive intervention>>>>transition back to Xarelto -Diagnosed March 2019  Sinus tachycardia -01/13/2018 CTA chest neg for PE -continue metoprolol -TSH 2.640 -likely due acute medical illness in setting of lung cancer -Personally reviewed EKG sinus rhythm, nonspecific ST changes  Tobacco abuse -tobacco cessation discussed    Disposition  Plan:   Home 6/7 or 6/8 Family Communication:  No Family at bedside  Consultants:  Palliative med  Code Status:  FULL   DVT Prophylaxis:  lovenox   Procedures: As Listed in Progress Note Above  Antibiotics: None      Subjective: Patient is breathing much better.  She states that she is breathing 80% better patient denies any nausea, vomiting, diarrhea, abdominal pain, dysuria, hematuria.  She has some mild dyspnea on exertion.  Objective: Vitals:   01/26/18 1137 01/26/18 1426 01/26/18 1428 01/26/18 1500  BP:  96/63 95/72   Pulse:  (!) 129 (!) 129   Resp:  20    Temp:  97.8 F (36.6 C)    TempSrc:  Oral    SpO2: 96% 94%  93%  Weight:        Intake/Output Summary (Last 24 hours) at 01/26/2018 1755 Last data filed at 01/26/2018 1745 Gross per 24 hour  Intake 360 ml  Output 1850 ml  Net -1490 ml   Weight change:  Exam:   General:  Pt is alert, follows commands appropriately, not in acute distress  HEENT: No icterus, No thrush, No neck mass, [place]/AT  Cardiovascular: RRR, S1/S2, no rubs, no gallops  Respiratory: Scattered bilateral rales.  Bibasilar expiratory wheeze.  Good air movement.  Abdomen: Soft/+BS, non tender, non distended, no guarding  Extremities: 1 + LE edema, No lymphangitis, No petechiae, No rashes, no synovitis   Data Reviewed: I have personally reviewed following labs and imaging studies Basic Metabolic Panel: Recent Labs  Lab 01/24/18 1010 01/25/18 0630 01/26/18 0656  NA 136 140 138  K 3.2* 2.3* 3.2*  CL 91* 88* 86*  CO2 32 43* 40*  GLUCOSE 84 106* 197*  BUN 22* 19 22*  CREATININE 0.87 0.92 0.95  CALCIUM 8.8* 8.0* 8.0*  MG 1.5*  --   --    Liver Function Tests: Recent Labs  Lab 01/24/18 1010  AST 83*  ALT 89*  ALKPHOS 121  BILITOT 2.7*  PROT 7.7  ALBUMIN 3.5   No results for input(s): LIPASE, AMYLASE in the last 168 hours. No results for input(s): AMMONIA in the last 168 hours. Coagulation Profile: Recent Labs    Lab 01/24/18 1047  INR 3.78   CBC: Recent Labs  Lab 01/24/18 1010 01/26/18 0656  WBC 17.9* 16.9*  NEUTROABS 16.2*  --   HGB 14.8 12.1  HCT 46.7* 40.4  MCV 80.4 82.8  PLT 176 185   Cardiac Enzymes: Recent Labs  Lab 01/24/18 1010  TROPONINI 0.03*   BNP: Invalid input(s): POCBNP CBG: No results for input(s): GLUCAP in the last 168 hours. HbA1C: No results for input(s): HGBA1C in the last 72 hours. Urine analysis:    Component Value Date/Time   COLORURINE YELLOW 01/13/2018 2015   APPEARANCEUR CLEAR 01/13/2018 2015   LABSPEC 1.012 01/13/2018 2015   PHURINE 6.0 01/13/2018 2015   GLUCOSEU NEGATIVE 01/13/2018 2015   HGBUR NEGATIVE 01/13/2018 2015   HGBUR negative 02/26/2008 [place] 01/13/2018 2015   KETONESUR NEGATIVE 01/13/2018 2015   PROTEINUR NEGATIVE 01/13/2018 2015   UROBILINOGEN 0.2 06/20/2012 1231   NITRITE NEGATIVE 01/13/2018 2015   LEUKOCYTESUR NEGATIVE 01/13/2018 2015   Sepsis Labs: @LABRCNTIP (procalcitonin:4,lacticidven:4) ) Recent Results (from the past 240 hour(s))  Culture, body fluid-bottle     Status: None   Collection Time: 01/17/18  1:32 PM  Result Value Ref Range Status   Specimen Description PLEURAL  Final   Special Requests BOTTLES DRAWN AEROBIC AND ANAEROBIC 10 CC EACH  Final   Culture   Final    NO GROWTH 5 DAYS Performed at [facility], [address], [place], [place] [address]    Report Status 01/22/2018 FINAL  Final  Gram stain     Status: None   Collection Time: 01/17/18  1:33 PM  Result Value Ref Range Status   Specimen Description PLEURAL  Final   Special Requests NONE  Final   Gram Stain   Final    NO ORGANISMS SEEN CYTOSPIN SMEAR WBC PRESENT,BOTH PMN AND MONONUCLEAR Performed at [facility], [address], [place], [place] [address]    Report Status 01/17/2018 FINAL  Final     Scheduled Meds: . budesonide (PULMICORT) nebulizer solution  0.5 mg Nebulization BID  . DULoxetine  60 mg Oral Daily  .  furosemide  40 mg Oral BID  . gabapentin  600 mg Oral QID  . ipratropium  0.5 mg Nebulization Q6H  . levalbuterol  1.25 mg Nebulization Q6H  . levothyroxine  100 mcg Oral QAC breakfast  . methylPREDNISolone (SOLU-MEDROL) injection  40 mg Intravenous Q12H  . metoprolol tartrate  75 mg Oral BID  . nicotine  21 mg Transdermal Daily  . pantoprazole  40 mg Oral Daily  . potassium chloride  40 mEq Oral BID  . rivaroxaban  20 mg Oral Q supper  . rosuvastatin  5 mg Oral q1800  . sodium chloride flush  3 mL Intravenous Q12H  . tapentadol  100 mg Oral Q12H   Continuous Infusions: . sodium chloride 250 mL (01/25/18 0822)    Procedures/Studies: Dg Chest 1 View  Result Date: 01/17/2018 CLINICAL DATA:  Post right thoracentesis EXAM: CHEST  1 VIEW COMPARISON:  01/14/2018 FINDINGS: Decreasing right effusion following thoracentesis. No pneumothorax.  Persistent right upper lobe perihilar opacity and right apical pleural thickening. Left Port-A-Cath remains in place, unchanged. Mild cardiomegaly. IMPRESSION: Decreasing right effusion following thoracentesis.  No pneumothorax. Electronically Signed   By: Rolm Baptise M.D.   On: 01/17/2018 14:37   Dg Chest 2 View  Result Date: 01/13/2018 CLINICAL DATA:  Shortness of breath. History of right lung carcinoma. History of breast carcinoma EXAM: CHEST - 2 VIEW COMPARISON:  PET-CT November 02, 2017 and chest radiograph November 25, 2017 FINDINGS: There is extensive scarring with volume loss in the right upper lobe. There is a stable area of apparent cavitation in the right upper lobe region. There is elevation of the right hemidiaphragm with blunting of the right costophrenic angle, likely due to scarring. Left lung is clear. Heart size is normal. There is distortion of pulmonary vascularity on the right. Pulmonary vascularity on the left is normal. No adenopathy is demonstrable by radiography. Port-A-Cath tip is in the superior vena cava. No pneumothorax. No evident bone  lesions. IMPRESSION: Extensive scarring with volume loss right upper lobe. Milder scarring noted right base. Chronic blunting of the right costophrenic angle noted. Stable area of cavitation right upper lobe posteriorly. These changes may be due to previous radiation therapy. No progression of cavitation in the right upper lobe. No new opacity. Left lung is clear. Stable cardiac silhouette. Port-A-Cath tip in superior vena cava. No evident pneumothorax. Electronically Signed   By: Lowella Grip III M.D.   On: 01/13/2018 15:51   Ct Head Wo Contrast  Result Date: 01/14/2018 CLINICAL DATA:  Altered mental status EXAM: CT HEAD WITHOUT CONTRAST TECHNIQUE: Contiguous axial images were obtained from the base of the skull through the vertex without intravenous contrast. COMPARISON:  06/20/2017 FINDINGS: Brain: There is no mass, hemorrhage or extra-axial collection. 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 The brain parenchyma is otherwise normal. The size and configuration of the ventricles and extra-axial CSF spaces are normal. Vascular: No hyperdense vessel or unexpected vascular calcification. Skull: Small osteoma near the right pterion. Sinuses/Orbits: No fluid levels or advanced mucosal thickening of the visualized paranasal sinuses. No mastoid or middle ear effusion. The orbits are normal. IMPRESSION: No acute abnormality. 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 Electronically Signed   By: Ulyses Jarred M.D.   On: 01/14/2018 22:47   Ct Angio Chest Pe W And/or Wo Contrast  Result Date: 01/13/2018 CLINICAL DATA:  Lung cancer, on chemotherapy. Worsening shortness of breath. EXAM: CT ANGIOGRAPHY CHEST WITH CONTRAST TECHNIQUE: Multidetector CT imaging of the chest was performed using the standard protocol during bolus administration of intravenous contrast. Multiplanar CT image reconstructions and MIPs were obtained to evaluate the vascular anatomy. CONTRAST:   155mL ISOVUE-370 IOPAMIDOL (ISOVUE-370) INJECTION 76% COMPARISON:  PET-CT from 11/02/2017. FINDINGS: Cardiovascular: Mildly reduced sensitivity in assessing for pulmonary embolus due to motion artifact. No filling defect is identified in the pulmonary arterial tree to suggest pulmonary embolus. Reduction in size of elements of the right pulmonary arterial tree likely attributable to hypoventilation related vaso constriction. Aortoiliac atherosclerotic vascular disease. Mild right heart enlargement. Mediastinum/Nodes: Progressive infiltration in density in the mediastinal adipose tissues could be from infiltrative tumor, mediastinal edema, or mediastinitis. The mild adenopathy shown on the prior exam is still present but is indistinctly marginated. Distended right jugular vein. No pericardial effusion. Lungs/Pleura: Progressive loculated pleural effusion on the right side; malignant effusion or empyema not excluded. Cavitary lesion in the right mid lung is probably in the upper lobe  and contains an air-fluid level, measuring about 4.6 cm transverse, roughly similar to the prior exam. This could be a cavitary mass or cavitary pneumonia, the persistence favors the former. Increased right perihilar airspace opacity. Right paramediastinal airspace opacity and/or loculated fluid is increased. Paraseptal and centrilobular emphysema. Upper Abdomen: Extensive new ascites. Chronic left hydronephrosis. Aortoiliac atherosclerotic vascular disease. Musculoskeletal: Old right rib fractures, some nonunited, stable. Thoracic spondylosis. Review of the MIP images confirms the above findings. IMPRESSION: 1. No acute pulmonary embolus identified. 2. Notable and increased right perihilar and right paramediastinal airspace opacity, query tumor, pneumonia, or radiation pneumonitis. 3. Current moderate to large loculated right pleural effusion is new compared to 11/02/2017. Malignant effusion or empyema not excluded. There is also  extensive new ascites and fluid infiltration of the mediastinum obscuring fatty tissues, which may be from third spacing of fluid, mediastinitis, or infiltrative tumor. No pericardial effusion. 4. Mild right heart enlargement. 5. Cavitary mass or cavitary pneumonia in the right upper lobe, essentially stable from June (which favors a mass). 6. Aortic Atherosclerosis (ICD10-I70.0) and Emphysema (ICD10-J43.9). 7. Chronic left hydronephrosis. Electronically Signed   By: Van Clines M.D.   On: 01/13/2018 19:20   Dg Chest Port 1 View  Result Date: 01/24/2018 CLINICAL DATA:  Shortness of breath. History of breast cancer. COPD. Prior radiation therapy to right lung. Right lung cancer. EXAM: PORTABLE CHEST 1 VIEW COMPARISON:  01/17/2018 FINDINGS: Left Port-A-Cath in place with the tip in the SVC. Chronic density in the right upper lobe is stable since prior study, likely postradiation changes. Small right pleural effusion and right base atelectasis. No confluent opacity on the left. Heart is borderline in size. IMPRESSION: Stable chronic changes in the right upper lobe, likely postradiation changes. Small right effusion with right base atelectasis. Electronically Signed   By: Rolm Baptise M.D.   On: 01/24/2018 10:29   Dg Chest Port 1 View  Result Date: 01/14/2018 CLINICAL DATA:  Respiratory failure EXAM: PORTABLE CHEST 1 VIEW COMPARISON:  Chest radiograph and chest CT Jan 13, 2018 FINDINGS: There is partially loculated effusion on the right with areas of consolidation in the right perihilar and lower lobe regions. The area cavitation noted on CT 1 day prior is not appreciable on this portable chest radiographic examination. There is atelectatic change in the left perihilar region. No frank consolidation is noted on the left. Note that a small portion of the left base is not seen. Heart is upper normal in size with pulmonary vascularity within normal limits. There is aortic atherosclerosis. Port-A-Cath tip is  in the superior vena cava. There is apparent adenopathy in the right paratracheal and right hilar regions. IMPRESSION: Extensive consolidation in portions of the right lung, most notably in the right perihilar and lower lobe regions. Partially loculated effusions seen throughout much the right hemithorax. There is adenopathy in the right hilar and paratracheal regions. Note that area cavitation noted on CT 1 day prior is not appreciable by frontal portable radiography. There is perihilar atelectasis on the left. Heart size normal. There is aortic atherosclerosis. Port-A-Cath tip in superior vena cava. No pneumothorax. Aortic Atherosclerosis (ICD10-I70.0). Electronically Signed   By: Lowella Grip III M.D.   On: 01/14/2018 17:46   US Thoracentesis Asp Pleural Space W/img Guide  Result Date: 01/17/2018 INDICATION: Right pleural effusion EXAM: ULTRASOUND GUIDED RIGHT THORACENTESIS MEDICATIONS: None. COMPLICATIONS: None immediate. PROCEDURE: An ultrasound guided thoracentesis was thoroughly discussed with the patient and questions answered. The benefits, risks, alternatives and complications were also  discussed. The patient understands and wishes to proceed with the procedure. Written consent was obtained. Ultrasound was performed to localize and mark an adequate pocket of fluid in the right posterior chest. The area was then prepped and draped in the normal sterile fashion. 1% Lidocaine was used for local anesthesia. Under ultrasound guidance a 19 gauge, 7-cm, Yueh catheter was introduced. Thoracentesis was performed. The catheter was removed and a dressing applied. FINDINGS: A total of approximately 1.3 liters of yellow fluid was removed. Samples were sent to the laboratory as requested by the clinical team. IMPRESSION: Successful ultrasound guided right thoracentesis yielding 1.3 liters of pleural fluid. Electronically Signed   By: Rolm Baptise M.D.   On: 01/17/2018 14:37    Orson Eva, DO  [facility] Pager [phone]  If 7PM-7AM, please contact night-coverage [email or web address] Password [email or web address] 01/26/2018, 5:55 PM   LOS: 2 days

## 2018-01-26 NOTE — Progress Notes (Signed)
Palliative: Sydney Blair is resting quietly in bed.  She greets me making and keeping eye contact.  Present today at bedside is her sister, Sydney Blair.  We talked about her improvement since yesterday, but my concern about such a quick return to the hospital.  We talked about the benefits of SNF rehab.  I shared that this would be a bridge, have skilled care until she has improvement, and can hopefully return close to her baseline.  We talked about 20 days at no cost, what rehab can and cannot do.  I share that she must have a 3 night stay to qualify.  Sydney Blair and her sister are considering rehab but both prefer that she return home, at this point. Talk about follow-up with oncology.  Sydney Blair was to see the oncologist on the day she was readmitted.  I share my concern over her ability to take treatment.  Sydney Blair states that she does not think she is strong enough or will qualify for further treatment.  She states that she did okay with chemotherapy, but became sick relatively quickly (1 week) with immunotherapy.  I encouraged her to have a discussion with the oncologist but, she ultimately decides what treatments she does and does not want. We reviewed her cardiology visit in March of this year.  We talked about heart failure, the 3 main ways the heart works; Dealer, vascular, and mechanical.  We talked about the strain on her heart, heart failure. We reviewed her medication and treatment plan.  I asked about the use of CPAP, as there is none in the room.  Sydney Blair states that she is unable to use the CPAP, it feels like she is being smothered.  We talk about her choices.  I share that if she does not want to use the CPAP then we will not force her, but, I ask her if she does not want to use the CPAP "even if it means you were to die without it".  She gives this some consideration, and states that she would like to use the CPAP if needed.   I encouraged her to consider how long we  would use CPAP, if she wakes up and needs it for a couple of days.  No further questions or concerns at this time. Conversation with nursing staff related to plan of care. Conversation with hospitalist related to plan of care/disposition. 47 minutes Quinn Axe, NP Palliative Medicine Team Team Phone # [phone]

## 2018-01-27 ENCOUNTER — Inpatient Hospital Stay (HOSPITAL_COMMUNITY): Payer: Medicaid Other

## 2018-01-27 DIAGNOSIS — Z7189 Other specified counseling: Secondary | ICD-10-CM

## 2018-01-27 LAB — BASIC METABOLIC PANEL
Anion gap: 11 (ref 5–15)
BUN: 23 mg/dL — AB (ref 6–20)
CALCIUM: 8 mg/dL — AB (ref 8.9–10.3)
CO2: 44 mmol/L — ABNORMAL HIGH (ref 22–32)
CREATININE: 1.05 mg/dL — AB (ref 0.44–1.00)
Chloride: 83 mmol/L — ABNORMAL LOW (ref 101–111)
GFR calc non Af Amer: 57 mL/min — ABNORMAL LOW (ref >60)
Glucose, Bld: 121 mg/dL — ABNORMAL HIGH (ref 65–99)
Potassium: 4.8 mmol/L (ref 3.5–5.1)
SODIUM: 138 mmol/L (ref 135–145)

## 2018-01-27 LAB — CBC
HCT: 39.6 % (ref 36.0–46.0)
Hemoglobin: 11.8 g/dL — ABNORMAL LOW (ref 12.0–15.0)
MCH: 24.9 pg — ABNORMAL LOW (ref 26.0–34.0)
MCHC: 29.8 g/dL — ABNORMAL LOW (ref 30.0–36.0)
MCV: 83.5 fL (ref 78.0–100.0)
PLATELETS: 188 10*3/uL (ref 150–400)
RBC: 4.74 MIL/uL (ref 3.87–5.11)
RDW: 20.3 % — AB (ref 11.5–15.5)
WBC: 25.1 10*3/uL — AB (ref 4.0–10.5)

## 2018-01-27 LAB — MAGNESIUM: Magnesium: 1.2 mg/dL — ABNORMAL LOW (ref 1.7–2.4)

## 2018-01-27 MED ORDER — PREDNISONE 10 MG PO TABS: 60.0000 mg | ORAL_TABLET | Freq: Every day | ORAL | 0 refills | Status: AC

## 2018-01-27 MED ORDER — PREDNISONE 20 MG PO TABS: 60.0000 mg | ORAL_TABLET | Freq: Every day | ORAL | Status: DC

## 2018-01-27 MED ORDER — FUROSEMIDE 40 MG PO TABS: 40.0000 mg | ORAL_TABLET | Freq: Every day | ORAL | 0 refills | Status: AC

## 2018-01-27 MED ORDER — ARFORMOTEROL TARTRATE 15 MCG/2ML IN NEBU
15.0000 ug | INHALATION_SOLUTION | Freq: Two times a day (BID) | RESPIRATORY_TRACT | Status: DC
  Administered 2018-01-27: 15 ug via RESPIRATORY_TRACT
  Filled 2018-01-27: qty 2

## 2018-01-27 MED ORDER — MAGNESIUM SULFATE 2 GM/50ML IV SOLN
2.0000 g | Freq: Once | INTRAVENOUS | Status: AC
  Administered 2018-01-27: 2 g via INTRAVENOUS
  Filled 2018-01-27: qty 50

## 2018-01-27 NOTE — Care Management Note (Signed)
Case Management Note  Patient Details  Name: JERELINE TICER MRN: [id number] Date of Birth: [birth year]  Subjective/Objective:     Admitted with CHF. Pt from home, lives with husband who is a Administrator and not there much of the time. Pt has home oxygen pta. She is active with [facility]. She has consult with palliative medicine. She will have little family support at DC.                Action/Plan: DC home today. Pt has chosen to DC home with resumption of [employer] services, she is aware [facility] has 48 hrs to make resumption visit. Juliann Pulse, [facility] rep, aware of DC home today. Pt has requested referral to hospice of [facility], for explanation of benefits and services. Pt needs hospital bed and WC. Have chosen [employer] for DME. Referral has been faxed and pt may be DC'd prior to DME being delivered.   Expected Discharge Date:  01/27/18               Expected Discharge Plan:  [place]  In-House Referral:  Hospice / Palliative Care  Discharge planning Services  CM Consult  Post Acute Care Choice:  Home Health, [place], Resumption of Svcs/PTA Provider Choice offered to:  Patient  DME Arranged:  Hospital bed, Wheelchair manual DME Agency:  [facility]  HH Arranged:  Therapist, sports, PT, Social Work [employer] Agency:  [facility]  Status of Service:  Completed, signed off  Sherald Barge, RN 01/27/2018, 12:57 PM

## 2018-01-27 NOTE — Progress Notes (Signed)
Palliative: Sydney Blair is resting quietly in bed.  She greets me making and keeping eye contact.  She appears as usual acutely/chronically ill.  Present today at bedside is Ermalinda Memos.  We talked about disposition, [name] is interested in rehab, but states she would only go for limited time, 2 weeks, her choices would be [facility] nursing [facility] versus [place] [facility] [place].  Unfortunately she only has [employer] and does not qualify for rehab except a 30-day stay. Prognosis discussed with permission.  I share that 6 months or less would not be surprising based on her current functional status, her chronic illnesses, frailty, multiple hospitalizations. We also talked about preferred place of death.  At this point, [name] states that she would feel comfortable passing away at home or the hospital.  She states she will return to the hospital as needed. We talked about the benefits of in-home hospice, choice offered.  [name] and her sister [name] agreed to have a meeting with [facility].  Conference with hospitalist related to plan of care and disposition. Case management meeting with family to discuss choices. 34 minutes Quinn Axe, NP Palliative Medicine Team Team Phone # [phone]

## 2018-01-27 NOTE — Discharge Summary (Addendum)
Physician Discharge Summary  Sydney Blair GBT:[id number] DOB: [birth year] DOA: 01/24/2018  PCP: [name], MD  Admit date: 01/24/2018 Discharge date: 01/27/2018  Admitted From: Home Disposition:  Home  Recommendations for Outpatient Follow-up:  1. Follow up with PCP in 1-2 weeks 2. 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 The patient was recently admitted tothe hospital from 01/13/2018 through 01/18/2018 during which she was treated for COPD exacerbation and acute on chronic diastolic CHF. The patient also had a right-sided thoracocentesis removing 1.3 L of fluid. The patient was discharged home on her usual 2 L of nasal cannula and a prednisone taper.    Discharge Diagnoses:  Acute on chronic respiratory failure with hypoxia -Secondary to COPD exacerbation and acute on chronic diastolic CHF -Pulmonary hygiene -Wean back to baseline 2 L  Acute on chronic diastolic CHF -Mostly right-sided CHF -Continue IV furosemide>>>transition po lasix evening 6/6 -increase home lasix dose to 40 mg daily -NEG 3.5 L for the admission although I/Os not complete during the hospitalization -daily weights--NEG 11 lbs for the stay -discharge weight 143 lbs -Personally reviewed chest x-ray--stableRUL opacity, no edema  COPD exacerbation -Continue IV Solu-Medrol>>>home with prednisone taper -Continue Pulmicort -Continue duo nebs -added brovana during hospitalization  Stage III squamous cell carcinoma of the  right lung -Status post chemoradiation 03/21/2017- 05/02/2017 -Outpatient follow-up withmed onc, Dr. Delton Coombes  Hypokalemia/Hypomagnesemia -Replete -check mag--1.2  LUE DVT -change to lovenox temporarily if pt should need any invasive intervention>>>>transition back to Xarelto -Diagnosed March 2019  Sinus tachycardia -01/13/2018 CTA chestneg for PE -continue metoprolol -TSH 2.640 -likely due acute medical illness in setting of lung cancer -Personally reviewed EKG sinus rhythm, nonspecific ST changes  Tobacco abuse -tobacco cessation discussed      Discharge Instructions   Allergies as of 01/27/2018      Reactions   Penicillins Anaphylaxis, Other (See Comments)   Has patient had a PCN reaction causing immediate rash, facial/tongue/throat swelling, SOB or lightheadedness with hypotension:Yes Has patient had a PCN reaction causing severe rash involving mucus membranes or skin necrosis:Yes Has patient had a PCN reaction that required hospitalization:No Has patient had a PCN reaction occurring within the last 10 years:No If all of the above answers are "NO", then may proceed with Cephalosporin use.      Medication List    STOP taking these medications   predniSONE 20 MG tablet Commonly known as:  DELTASONE     TAKE these medications   albuterol (2.5 MG/3ML) 0.083% nebulizer solution Commonly known as:  PROVENTIL Take 3 mLs (2.5 mg total) by nebulization every 4 (four) hours as needed for wheezing or shortness of breath.   albuterol 108 (90 Base) MCG/ACT inhaler Commonly known as:  PROVENTIL HFA;VENTOLIN HFA Inhale 2 puffs into the lungs every 6 (six) hours as needed for wheezing or shortness of breath.   budesonide-formoterol 160-4.5 MCG/ACT inhaler Commonly known as:  SYMBICORT Inhale 2 puffs into the lungs daily.   dexlansoprazole 60 MG capsule Commonly known as:  DEXILANT Take 60 mg by mouth daily.   DULoxetine 60 MG capsule Commonly known as:   CYMBALTA Take 60 mg by mouth  daily.   furosemide 40 MG tablet Commonly known as:  LASIX Take 1 tablet (40 mg total) by mouth daily. What changed:    medication strength  how much to take  additional instructions   gabapentin 800 MG tablet Commonly known as:  NEURONTIN Take 800 mg by mouth 4 (four) times daily.   levothyroxine 100 MCG tablet Commonly known as:  SYNTHROID, LEVOTHROID Take 100 mcg by mouth daily before breakfast. Reported on 02/03/2016   meclizine 12.5 MG tablet Commonly known as:  ANTIVERT Take 25 mg by mouth daily.   methocarbamol 500 MG tablet Commonly known as:  ROBAXIN Take 500 mg by mouth every 6 (six) hours as needed for muscle spasms.   Metoprolol Tartrate 75 MG Tabs Take 75 mg by mouth 2 (two) times daily.   Misc. Devices Misc Please provide patient with a nebulizer machine   NUCYNTA ER 100 MG 12 hr tablet Generic drug:  tapentadol Take 100 mg by mouth every 12 (twelve) hours.   potassium chloride 10 MEQ tablet Commonly known as:  K-DUR Take 2 tablets (20 mEq total) by mouth daily.   Rivaroxaban 15 & 20 MG Tbpk Take 20 mg by mouth daily.   rosuvastatin 5 MG tablet Commonly known as:  CRESTOR Take 1 tablet (5 mg total) by mouth daily.   tiotropium 18 MCG inhalation capsule Commonly known as:  SPIRIVA Place 1 capsule (18 mcg total) into inhaler and inhale daily.       Allergies  Allergen Reactions  . Penicillins Anaphylaxis and Other (See Comments)    Has patient had a PCN reaction causing immediate rash, facial/tongue/throat swelling, SOB or lightheadedness with hypotension:Yes Has patient had a PCN reaction causing severe rash involving mucus membranes or skin necrosis:Yes Has patient had a PCN reaction that required hospitalization:No Has patient had a PCN reaction occurring within the last 10 years:No If all of the above answers are "NO", then may proceed with Cephalosporin use.     Consultations:  Palliative  medicine   Procedures/Studies: Dg Chest 1 View  Result Date: 01/17/2018 CLINICAL DATA:  Post right thoracentesis EXAM: CHEST  1 VIEW COMPARISON:  01/14/2018 FINDINGS: Decreasing right effusion following thoracentesis. No pneumothorax. Persistent right upper lobe perihilar opacity and right apical pleural thickening. Left Port-A-Cath remains in place, unchanged. Mild cardiomegaly. IMPRESSION: Decreasing right effusion following thoracentesis.  No pneumothorax. Electronically Signed   By: Rolm Baptise M.D.   On: 01/17/2018 14:37   Dg Chest 2 View  Result Date: 01/13/2018 CLINICAL DATA:  Shortness of breath. History of right lung carcinoma. History of breast carcinoma EXAM: CHEST - 2 VIEW COMPARISON:  PET-CT November 02, 2017 and chest radiograph November 25, 2017 FINDINGS: There is extensive scarring with volume loss in the right upper lobe. There is a stable area of apparent cavitation in the right upper lobe region. There is elevation of the right hemidiaphragm with blunting of the right costophrenic angle, likely due to scarring. Left lung is clear. Heart size is normal. There is distortion of pulmonary vascularity on the right. Pulmonary vascularity on the left is normal. No adenopathy is demonstrable by radiography. Port-A-Cath tip is in the superior vena cava. No pneumothorax. No evident bone lesions. IMPRESSION: Extensive scarring with volume loss right upper lobe. Milder scarring noted right base. Chronic blunting of the right costophrenic angle noted. Stable area of cavitation right upper lobe posteriorly. These changes may be due to previous radiation therapy. No progression of cavitation in the right upper lobe. No  new opacity. Left lung is clear. Stable cardiac silhouette. Port-A-Cath tip in superior vena cava. No evident pneumothorax. Electronically Signed   By: Lowella Grip III M.D.   On: 01/13/2018 15:51   Ct Head Wo Contrast  Result Date: 01/14/2018 CLINICAL DATA:  Altered mental status  EXAM: CT HEAD WITHOUT CONTRAST TECHNIQUE: Contiguous axial images were obtained from the base of the skull through the vertex without intravenous contrast. COMPARISON:  06/20/2017 FINDINGS: Brain: There is no mass, hemorrhage or extra-axial collection. 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 The brain parenchyma is otherwise normal. The size and configuration of the ventricles and extra-axial CSF spaces are normal. Vascular: No hyperdense vessel or unexpected vascular calcification. Skull: Small osteoma near the right pterion. Sinuses/Orbits: No fluid levels or advanced mucosal thickening of the visualized paranasal sinuses. No mastoid or middle ear effusion. The orbits are normal. IMPRESSION: No acute abnormality. 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 Electronically Signed   By: Ulyses Jarred M.D.   On: 01/14/2018 22:47   Ct Angio Chest Pe W And/or Wo Contrast  Result Date: 01/13/2018 CLINICAL DATA:  Lung cancer, on chemotherapy. Worsening shortness of breath. EXAM: CT ANGIOGRAPHY CHEST WITH CONTRAST TECHNIQUE: Multidetector CT imaging of the chest was performed using the standard protocol during bolus administration of intravenous contrast. Multiplanar CT image reconstructions and MIPs were obtained to evaluate the vascular anatomy. CONTRAST:  121mL ISOVUE-370 IOPAMIDOL (ISOVUE-370) INJECTION 76% COMPARISON:  PET-CT from 11/02/2017. FINDINGS: Cardiovascular: Mildly reduced sensitivity in assessing for pulmonary embolus due to motion artifact. No filling defect is identified in the pulmonary arterial tree to suggest pulmonary embolus. Reduction in size of elements of the right pulmonary arterial tree likely attributable to hypoventilation related vaso constriction. Aortoiliac atherosclerotic vascular disease. Mild right heart enlargement. Mediastinum/Nodes: Progressive infiltration in density in the mediastinal adipose tissues could be from infiltrative  tumor, mediastinal edema, or mediastinitis. The mild adenopathy shown on the prior exam is still present but is indistinctly marginated. Distended right jugular vein. No pericardial effusion. Lungs/Pleura: Progressive loculated pleural effusion on the right side; malignant effusion or empyema not excluded. Cavitary lesion in the right mid lung is probably in the upper lobe and contains an air-fluid level, measuring about 4.6 cm transverse, roughly similar to the prior exam. This could be a cavitary mass or cavitary pneumonia, the persistence favors the former. Increased right perihilar airspace opacity. Right paramediastinal airspace opacity and/or loculated fluid is increased. Paraseptal and centrilobular emphysema. Upper Abdomen: Extensive new ascites. Chronic left hydronephrosis. Aortoiliac atherosclerotic vascular disease. Musculoskeletal: Old right rib fractures, some nonunited, stable. Thoracic spondylosis. Review of the MIP images confirms the above findings. IMPRESSION: 1. No acute pulmonary embolus identified. 2. Notable and increased right perihilar and right paramediastinal airspace opacity, query tumor, pneumonia, or radiation pneumonitis. 3. Current moderate to large loculated right pleural effusion is new compared to 11/02/2017. Malignant effusion or empyema not excluded. There is also extensive new ascites and fluid infiltration of the mediastinum obscuring fatty tissues, which may be from third spacing of fluid, mediastinitis, or infiltrative tumor. No pericardial effusion. 4. Mild right heart enlargement. 5. Cavitary mass or cavitary pneumonia in the right upper lobe, essentially stable from June (which favors a mass). 6. Aortic Atherosclerosis (ICD10-I70.0) and Emphysema (ICD10-J43.9). 7. Chronic left hydronephrosis. Electronically Signed   By: Van Clines M.D.   On: 01/13/2018 19:20   Dg Chest Port 1 View  Result Date: 01/24/2018 CLINICAL DATA:  Shortness of breath. History of breast  cancer. COPD. Prior radiation therapy to right lung. Right lung cancer. EXAM: PORTABLE CHEST 1 VIEW COMPARISON:  01/17/2018 FINDINGS: Left Port-A-Cath in place with the tip in the SVC. Chronic density in the right upper lobe is stable since prior study, likely postradiation changes. Small right pleural effusion and right base atelectasis. No confluent opacity on the left. Heart is borderline in size. IMPRESSION: Stable chronic changes in the right upper lobe, likely postradiation changes. Small right effusion with right base atelectasis. Electronically Signed   By: Rolm Baptise M.D.   On: 01/24/2018 10:29   Dg Chest Port 1 View  Result Date: 01/14/2018 CLINICAL DATA:  Respiratory failure EXAM: PORTABLE CHEST 1 VIEW COMPARISON:  Chest radiograph and chest CT Jan 13, 2018 FINDINGS: There is partially loculated effusion on the right with areas of consolidation in the right perihilar and lower lobe regions. The area cavitation noted on CT 1 day prior is not appreciable on this portable chest radiographic examination. There is atelectatic change in the left perihilar region. No frank consolidation is noted on the left. Note that a small portion of the left base is not seen. Heart is upper normal in size with pulmonary vascularity within normal limits. There is aortic atherosclerosis. Port-A-Cath tip is in the superior vena cava. There is apparent adenopathy in the right paratracheal and right hilar regions. IMPRESSION: Extensive consolidation in portions of the right lung, most notably in the right perihilar and lower lobe regions. Partially loculated effusions seen throughout much the right hemithorax. There is adenopathy in the right hilar and paratracheal regions. Note that area cavitation noted on CT 1 day prior is not appreciable by frontal portable radiography. There is perihilar atelectasis on the left. Heart size normal. There is aortic atherosclerosis. Port-A-Cath tip in superior vena cava. No pneumothorax.  Aortic Atherosclerosis (ICD10-I70.0). Electronically Signed   By: Lowella Grip III M.D.   On: 01/14/2018 17:46   US Thoracentesis Asp Pleural Space W/img Guide  Result Date: 01/17/2018 INDICATION: Right pleural effusion EXAM: ULTRASOUND GUIDED RIGHT THORACENTESIS MEDICATIONS: None. COMPLICATIONS: None immediate. PROCEDURE: An ultrasound guided thoracentesis was thoroughly discussed with the patient and questions answered. The benefits, risks, alternatives and complications were also discussed. The patient understands and wishes to proceed with the procedure. Written consent was obtained. Ultrasound was performed to localize and mark an adequate pocket of fluid in the right posterior chest. The area was then prepped and draped in the normal sterile fashion. 1% Lidocaine was used for local anesthesia. Under ultrasound guidance a 19 gauge, 7-cm, Yueh catheter was introduced. Thoracentesis was performed. The catheter was removed and a dressing applied. FINDINGS: A total of approximately 1.3 liters of yellow fluid was removed. Samples were sent to the laboratory as requested by the clinical team. IMPRESSION: Successful ultrasound guided right thoracentesis yielding 1.3 liters of pleural fluid. Electronically Signed   By: Rolm Baptise M.D.   On: 01/17/2018 14:37        Discharge Exam: Vitals:   01/27/18 0826 01/27/18 1002  BP:    Pulse:    Resp:    Temp:    SpO2: 92% 90%   Vitals:   01/27/18 0654 01/27/18 0819 01/27/18 0826 01/27/18 1002  BP: 114/88     Pulse: (!) 127     Resp: 20     Temp: 98.3 F (36.8 C)     TempSrc: Oral     SpO2: 95%  92% 90%  Weight:  65.3 kg (143 lb 15.4 oz)  Height:  5\' 3"  (1.6 m)      General: Pt is alert, awake, not in acute distress Cardiovascular: RRR, S1/S2 +, no rubs, no gallops Respiratory: bilateral scattered rales.  Decreased BS.  No wheeze Abdominal: Soft, NT, ND, bowel sounds + Extremities: no edema, no cyanosis   The results of significant  diagnostics from this hospitalization (including imaging, microbiology, ancillary and laboratory) are listed below for reference.    Significant Diagnostic Studies: Dg Chest 1 View  Result Date: 01/17/2018 CLINICAL DATA:  Post right thoracentesis EXAM: CHEST  1 VIEW COMPARISON:  01/14/2018 FINDINGS: Decreasing right effusion following thoracentesis. No pneumothorax. Persistent right upper lobe perihilar opacity and right apical pleural thickening. Left Port-A-Cath remains in place, unchanged. Mild cardiomegaly. IMPRESSION: Decreasing right effusion following thoracentesis.  No pneumothorax. Electronically Signed   By: Rolm Baptise M.D.   On: 01/17/2018 14:37   Dg Chest 2 View  Result Date: 01/13/2018 CLINICAL DATA:  Shortness of breath. History of right lung carcinoma. History of breast carcinoma EXAM: CHEST - 2 VIEW COMPARISON:  PET-CT November 02, 2017 and chest radiograph November 25, 2017 FINDINGS: There is extensive scarring with volume loss in the right upper lobe. There is a stable area of apparent cavitation in the right upper lobe region. There is elevation of the right hemidiaphragm with blunting of the right costophrenic angle, likely due to scarring. Left lung is clear. Heart size is normal. There is distortion of pulmonary vascularity on the right. Pulmonary vascularity on the left is normal. No adenopathy is demonstrable by radiography. Port-A-Cath tip is in the superior vena cava. No pneumothorax. No evident bone lesions. IMPRESSION: Extensive scarring with volume loss right upper lobe. Milder scarring noted right base. Chronic blunting of the right costophrenic angle noted. Stable area of cavitation right upper lobe posteriorly. These changes may be due to previous radiation therapy. No progression of cavitation in the right upper lobe. No new opacity. Left lung is clear. Stable cardiac silhouette. Port-A-Cath tip in superior vena cava. No evident pneumothorax. Electronically Signed   By: Lowella Grip III M.D.   On: 01/13/2018 15:51   Ct Head Wo Contrast  Result Date: 01/14/2018 CLINICAL DATA:  Altered mental status EXAM: CT HEAD WITHOUT CONTRAST TECHNIQUE: Contiguous axial images were obtained from the base of the skull through the vertex without intravenous contrast. COMPARISON:  06/20/2017 FINDINGS: Brain: There is no mass, hemorrhage or extra-axial collection. 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 The brain parenchyma is otherwise normal. The size and configuration of the ventricles and extra-axial CSF spaces are normal. Vascular: No hyperdense vessel or unexpected vascular calcification. Skull: Small osteoma near the right pterion. Sinuses/Orbits: No fluid levels or advanced mucosal thickening of the visualized paranasal sinuses. No mastoid or middle ear effusion. The orbits are normal. IMPRESSION: No acute abnormality. 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 Electronically Signed   By: Ulyses Jarred M.D.   On: 01/14/2018 22:47   Ct Angio Chest Pe W And/or Wo Contrast  Result Date: 01/13/2018 CLINICAL DATA:  Lung cancer, on chemotherapy. Worsening shortness of breath. EXAM: CT ANGIOGRAPHY CHEST WITH CONTRAST TECHNIQUE: Multidetector CT imaging of the chest was performed using the standard protocol during bolus administration of intravenous contrast. Multiplanar CT image reconstructions and MIPs were obtained to evaluate the vascular anatomy. CONTRAST:  12mL ISOVUE-370 IOPAMIDOL (ISOVUE-370) INJECTION 76% COMPARISON:  PET-CT from 11/02/2017. FINDINGS: Cardiovascular: Mildly reduced sensitivity in assessing for pulmonary embolus due to motion artifact. No filling  defect is identified in the pulmonary arterial tree to suggest pulmonary embolus. Reduction in size of elements of the right pulmonary arterial tree likely attributable to hypoventilation related vaso constriction. Aortoiliac atherosclerotic vascular disease. Mild right heart  enlargement. Mediastinum/Nodes: Progressive infiltration in density in the mediastinal adipose tissues could be from infiltrative tumor, mediastinal edema, or mediastinitis. The mild adenopathy shown on the prior exam is still present but is indistinctly marginated. Distended right jugular vein. No pericardial effusion. Lungs/Pleura: Progressive loculated pleural effusion on the right side; malignant effusion or empyema not excluded. Cavitary lesion in the right mid lung is probably in the upper lobe and contains an air-fluid level, measuring about 4.6 cm transverse, roughly similar to the prior exam. This could be a cavitary mass or cavitary pneumonia, the persistence favors the former. Increased right perihilar airspace opacity. Right paramediastinal airspace opacity and/or loculated fluid is increased. Paraseptal and centrilobular emphysema. Upper Abdomen: Extensive new ascites. Chronic left hydronephrosis. Aortoiliac atherosclerotic vascular disease. Musculoskeletal: Old right rib fractures, some nonunited, stable. Thoracic spondylosis. Review of the MIP images confirms the above findings. IMPRESSION: 1. No acute pulmonary embolus identified. 2. Notable and increased right perihilar and right paramediastinal airspace opacity, query tumor, pneumonia, or radiation pneumonitis. 3. Current moderate to large loculated right pleural effusion is new compared to 11/02/2017. Malignant effusion or empyema not excluded. There is also extensive new ascites and fluid infiltration of the mediastinum obscuring fatty tissues, which may be from third spacing of fluid, mediastinitis, or infiltrative tumor. No pericardial effusion. 4. Mild right heart enlargement. 5. Cavitary mass or cavitary pneumonia in the right upper lobe, essentially stable from June (which favors a mass). 6. Aortic Atherosclerosis (ICD10-I70.0) and Emphysema (ICD10-J43.9). 7. Chronic left hydronephrosis. Electronically Signed   By: Van Clines M.D.    On: 01/13/2018 19:20   Dg Chest Port 1 View  Result Date: 01/24/2018 CLINICAL DATA:  Shortness of breath. History of breast cancer. COPD. Prior radiation therapy to right lung. Right lung cancer. EXAM: PORTABLE CHEST 1 VIEW COMPARISON:  01/17/2018 FINDINGS: Left Port-A-Cath in place with the tip in the SVC. Chronic density in the right upper lobe is stable since prior study, likely postradiation changes. Small right pleural effusion and right base atelectasis. No confluent opacity on the left. Heart is borderline in size. IMPRESSION: Stable chronic changes in the right upper lobe, likely postradiation changes. Small right effusion with right base atelectasis. Electronically Signed   By: Rolm Baptise M.D.   On: 01/24/2018 10:29   Dg Chest Port 1 View  Result Date: 01/14/2018 CLINICAL DATA:  Respiratory failure EXAM: PORTABLE CHEST 1 VIEW COMPARISON:  Chest radiograph and chest CT Jan 13, 2018 FINDINGS: There is partially loculated effusion on the right with areas of consolidation in the right perihilar and lower lobe regions. The area cavitation noted on CT 1 day prior is not appreciable on this portable chest radiographic examination. There is atelectatic change in the left perihilar region. No frank consolidation is noted on the left. Note that a small portion of the left base is not seen. Heart is upper normal in size with pulmonary vascularity within normal limits. There is aortic atherosclerosis. Port-A-Cath tip is in the superior vena cava. There is apparent adenopathy in the right paratracheal and right hilar regions. IMPRESSION: Extensive consolidation in portions of the right lung, most notably in the right perihilar and lower lobe regions. Partially loculated effusions seen throughout much the right hemithorax. There is adenopathy in the right hilar and paratracheal  regions. Note that area cavitation noted on CT 1 day prior is not appreciable by frontal portable radiography. There is perihilar  atelectasis on the left. Heart size normal. There is aortic atherosclerosis. Port-A-Cath tip in superior vena cava. No pneumothorax. Aortic Atherosclerosis (ICD10-I70.0). Electronically Signed   By: Lowella Grip III M.D.   On: 01/14/2018 17:46   US Thoracentesis Asp Pleural Space W/img Guide  Result Date: 01/17/2018 INDICATION: Right pleural effusion EXAM: ULTRASOUND GUIDED RIGHT THORACENTESIS MEDICATIONS: None. COMPLICATIONS: None immediate. PROCEDURE: An ultrasound guided thoracentesis was thoroughly discussed with the patient and questions answered. The benefits, risks, alternatives and complications were also discussed. The patient understands and wishes to proceed with the procedure. Written consent was obtained. Ultrasound was performed to localize and mark an adequate pocket of fluid in the right posterior chest. The area was then prepped and draped in the normal sterile fashion. 1% Lidocaine was used for local anesthesia. Under ultrasound guidance a 19 gauge, 7-cm, Yueh catheter was introduced. Thoracentesis was performed. The catheter was removed and a dressing applied. FINDINGS: A total of approximately 1.3 liters of yellow fluid was removed. Samples were sent to the laboratory as requested by the clinical team. IMPRESSION: Successful ultrasound guided right thoracentesis yielding 1.3 liters of pleural fluid. Electronically Signed   By: Rolm Baptise M.D.   On: 01/17/2018 14:37     Microbiology: Recent Results (from the past 240 hour(s))  Culture, body fluid-bottle     Status: None   Collection Time: 01/17/18  1:32 PM  Result Value Ref Range Status   Specimen Description PLEURAL  Final   Special Requests BOTTLES DRAWN AEROBIC AND ANAEROBIC 10 CC EACH  Final   Culture   Final    NO GROWTH 5 DAYS Performed at [facility], [address] [place], [place] [address]    Report Status 01/22/2018 FINAL  Final  Gram stain     Status: None   Collection Time: 01/17/18  1:33 PM  Result Value  Ref Range Status   Specimen Description PLEURAL  Final   Special Requests NONE  Final   Gram Stain   Final    NO ORGANISMS SEEN CYTOSPIN SMEAR WBC PRESENT,BOTH PMN AND MONONUCLEAR Performed at [facility], [address] [place], [place] [address]    Report Status 01/17/2018 FINAL  Final     Labs: Basic Metabolic Panel: Recent Labs  Lab 01/24/18 1010 01/25/18 0630 01/26/18 0656 01/27/18 0434  NA 136 140 138 138  K 3.2* 2.3* 3.2* 4.8  CL 91* 88* 86* 83*  CO2 32 43* 40* 44*  GLUCOSE 84 106* 197* 121*  BUN 22* 19 22* 23*  CREATININE 0.87 0.92 0.95 1.05*  CALCIUM 8.8* 8.0* 8.0* 8.0*  MG 1.5*  --   --  1.2*   Liver Function Tests: Recent Labs  Lab 01/24/18 1010  AST 83*  ALT 89*  ALKPHOS 121  BILITOT 2.7*  PROT 7.7  ALBUMIN 3.5   No results for input(s): LIPASE, AMYLASE in the last 168 hours. No results for input(s): AMMONIA in the last 168 hours. CBC: Recent Labs  Lab 01/24/18 1010 01/26/18 0656 01/27/18 0434  WBC 17.9* 16.9* 25.1*  NEUTROABS 16.2*  --   --   HGB 14.8 12.1 11.8*  HCT 46.7* 40.4 39.6  MCV 80.4 82.8 83.5  PLT 176 185 188   Cardiac Enzymes: Recent Labs  Lab 01/24/18 1010  TROPONINI 0.03*   BNP: Invalid input(s): POCBNP CBG: No results for input(s): GLUCAP in  the last 168 hours.  Time coordinating discharge:  36 minutes  Signed:  Orson Eva, DO [facility] Pager: [phone] 01/27/2018, 11:00 AM

## 2018-01-27 NOTE — Care Management (Addendum)
Patient Information   SS# [id number]  Patient Name Sydney Blair, Sydney Blair ([id number]) Sex Female DOB 18-Oct-1955  Room Bed  A339 A339-01  Patient Demographics   Address [place] [address] [place] [address] Phone [phone] [facility]) [phone] (Mobile) E-mail Address [email or web address]  Patient Ethnicity & Race   Ethnic Group Patient Race  Not Hispanic or Latino White or Caucasian  Emergency Contact(s)   Name Relation Home Work Mobile  [place] [phone]  [place] [phone]    Documents on File    Status Date Received Description  Documents for the Patient  EMR Medication Summary Not Received    EMR Immunization Summary Not Received    EMR Problem Summary Not Received    EMR Patient Summary Not Received    Driver's License Not Received    [place] - Scanned Received 11/11/10   [place] E-Signature HIPAA Notice of Privacy Received 11/11/10   [place] E-Signature HIPAA Notice of Privacy Spanish Not Received    Advance Directives/Living Will/HCPOA/POA Not Received    Insurance Card Received 01/20/11   Historic Radiology Documentation Not Received    Historic Radiology Documentation Not Received    Insurance Card Not Received    Historic Radiology Documentation Not Received    Historic Radiology Documentation Not Received    Historic Radiology Documentation Not Received    Insurance Card Not Received    Insurance Card Not Received    Editor, commissioning Not Received    Insurance Card Not Received    HIM ROI Authorization Not Received    Release of Information Not Received    HIM ROI Authorization Not Received    Release of Information Not Received    HIM ROI Authorization Not Received    Insurance Card Received 11/15/12   Insurance Card Not Received    Insurance Card Not Received    Insurance Card Not Received    Release of Information Received 12/01/12  dpr [email or web address]  Insurance Card Received 03/05/13   Insurance Card Not Received    HIM ROI Authorization Not Received    Insurance Card Received 06/04/13   Insurance Card Not Received    HIM ROI Authorization Not Received    AMB Correspondence Not Received  12/14 [place] E-Signature HIPAA Notice of Privacy Received 12/27/13   Advanced Beneficiary Notice (ABN) Received 11/11/15 [facility] [place] MED[place] ABN  Insurance Card Received 12/27/13 [facility]  Driver's License Received 26/71/24 NCDMVL EXP- 2017  Insurance Card Received 01/16/14 medicaid  HIM ROI Authorization  01/17/14   AMB Correspondence  01/28/14 OPERATIVE REPORT [facility]  E-Signature AOB Spanish Not Received    AMB Correspondence  03/21/14 OFICE NOTE TEOH MD, S  HIM ROI Authorization  10/30/14   HIM ROI Authorization  10/30/14   Other Photo ID Not Received    Insurance Card Received 01/27/15 Medicaid iss 03/22/14  HIM ROI Authorization  04/21/15 [place] DDS  Release of Information  04/23/15   HIM ROI Authorization  05/01/15 [place] DDS  HIM ROI Authorization  06/25/15 [place] DDS  HIM ROI Authorization  07/30/15   AMB Provider Completed Forms  07/28/15 REFERRAL PULMONARY REHABILITATION /RESPIRATORY [facility]  Release of Information  08/22/15   Insurance Card Received 11/11/15 MEDICAID 2017  HIM ROI Authorization (Expired) 01/30/16 ROI TO DR. Sanjuana Kava -[facility] JMS  Insurance Card Received 02/03/16 ROSM/Medicaid  AMB Correspondence  02/03/16 [place] Card   [place]ROSM  Insurance Card Received 02/01/17 ROSM/Medicaid/RSA 2018  Release of Information Received 09/06/16 rga  HIM ROI Authorization  09/07/16   AMB Correspondence  09/06/16 APPROVAL STATUS INQUIRY [place] TRACKS  HIM ROI Authorization  10/05/16   Release of Information Received 12/02/16 D[facility]/RSA  AMB Correspondence  11/12/16 [facility]  HIM ROI Authorization  12/28/16   AMB Correspondence  12/29/16  CASE REQUEST RESULT/APRROVAL [facility]  AMB Correspondence  12/29/16 PRE-AUTHORIZATION CONFIRMATION FAX [place] Card   315  [place] HIPAA NOTICE OF PRIVACY - Scanned   315  REC  01-14-17  Release of Information Received 02/01/17 DPR Signed 2018 LBPU  Insurance Card Received 03/10/17 MEDICAID-[facility]  Release of Information Received 03/11/17 [facility]-ROI  AMB Correspondence  03/11/17 PRE-AUTHORIZATION CONFIRMATION [facility]  AMB Correspondence Received 03/22/17 CMN [facility]/[place] TRACKS  Insurance Card     AMB New Patient Records/Historical Received 03/09/17 Creola Corn MD,  Insurance Card Received 10/26/17 [place] Medicaid 2019  Release of Information Received 10/27/17 [facility] system wide DPR 3.6.19  AMB Correspondence Received 10/31/17 02/19 [place] E-Signature HIPAA Notice of Privacy Received 11/07/17 [facility] ED 10/2017  AMB Correspondence Received 10/06/17 [facility] CMN NCTRACKS  AMB Correspondence Received 11/29/17 REQUEST FOR PRIOR APPROVAL CMN/PA [place] TRACKS  AMB Correspondence (Deleted) 02/13/16 06/17-07/17 PRE-AUTHORIZATION CONFIRMATION [facility]  Documents for the Encounter  AOB (Assignment of Insurance Benefits) Not Received    E-signature AOB Unable to Obtain 01/24/18   MEDICARE RIGHTS Not Received    E-signature Medicare Rights     ED Patient Billing Extract   ED PB Billing Extract  Cardiac Monitoring Strip Received 01/24/18   Cardiac Monitoring Strip Shift Summary Received 01/24/18   EKG Received 01/25/18   Admission Information   Attending Provider Admitting Provider Admission Type Admission Date/Time  Tat, Shanon Brow, MD Barton Dubois, MD Emergency 01/24/18 [phone]  Discharge Date Hospital Service Auth/Cert Status Service Area   Internal Medicine Incomplete [facility]  Unit Room/Bed Admission Status   AP-DEPT 300 A339/A339-01 Admission (Confirmed)   Admission   Complaint  LEGS SWELLING, BRUISING  Hospital Account   Name  Acct ID Class Status Primary Coverage  Talishia, Betzler [id number] Inpatient Open MEDICAID [place]      Guarantor Account (for Hospital Account [id number])   Name Relation to Pt Service Area Active? Acct Type  Fanny Dance Self [facility] Yes Personal/Family  Address Phone    [place], [place] [address] [phone])        Coverage Information (for Hospital Account [id number])   F/O Payor/Plan Precert #  MEDICAID [place]/MEDICAID [place] ACCESS   Subscriber Subscriber #  Natassja, Ollis [id number] [facility]  Address Phone  [place], [place] [address] [phone]

## 2018-01-27 NOTE — Care Management (Signed)
Patient requires frequent re-positioning of the body in ways that cannot be achieved with an ordinary bed or wedge pillow, to eliminate pain, reduce pressure, and the head of the bed to be elevated more than 30 degrees most of the time due to CHF  Pt has mobility limitations that impair their ability to do one or more mobility-related activities of daily living in customary locations in the home. Patient cannot use crutches, cane, or walker to resolve the issue sufficiently, patient can safely self propel the wheelchair in the home or has a care giver that can assist.

## 2018-02-02 ENCOUNTER — Other Ambulatory Visit (HOSPITAL_COMMUNITY): Payer: Self-pay | Admitting: Hematology

## 2018-02-02 DIAGNOSIS — E876 Hypokalemia: Secondary | ICD-10-CM

## 2018-02-02 DIAGNOSIS — C3491 Malignant neoplasm of unspecified part of right bronchus or lung: Secondary | ICD-10-CM

## 2018-02-06 ENCOUNTER — Ambulatory Visit (HOSPITAL_COMMUNITY): Payer: Medicaid Other | Admitting: Internal Medicine

## 2018-02-15 ENCOUNTER — Telehealth (HOSPITAL_COMMUNITY): Payer: Self-pay | Admitting: *Deleted

## 2018-02-20 NOTE — Telephone Encounter (Signed)
Home health is calling requesting the signed orders that were faxed last week. Explained to [place] that we have the orders and Dr. Tomie China nurse has them in his folder to be signed. Once signed they will be faxed back. Rip Harbour verbalized understanding and thanks.

## 2018-02-20 DEATH — deceased

## 2018-07-06 IMAGING — DX DG CHEST 1V PORT
1 series · 1 of 1 positions shown · non-contrast
Comparison: Yesterday

CLINICAL DATA: Endotracheal tube positioning

EXAM:
PORTABLE CHEST 1 VIEW

[chest ap]
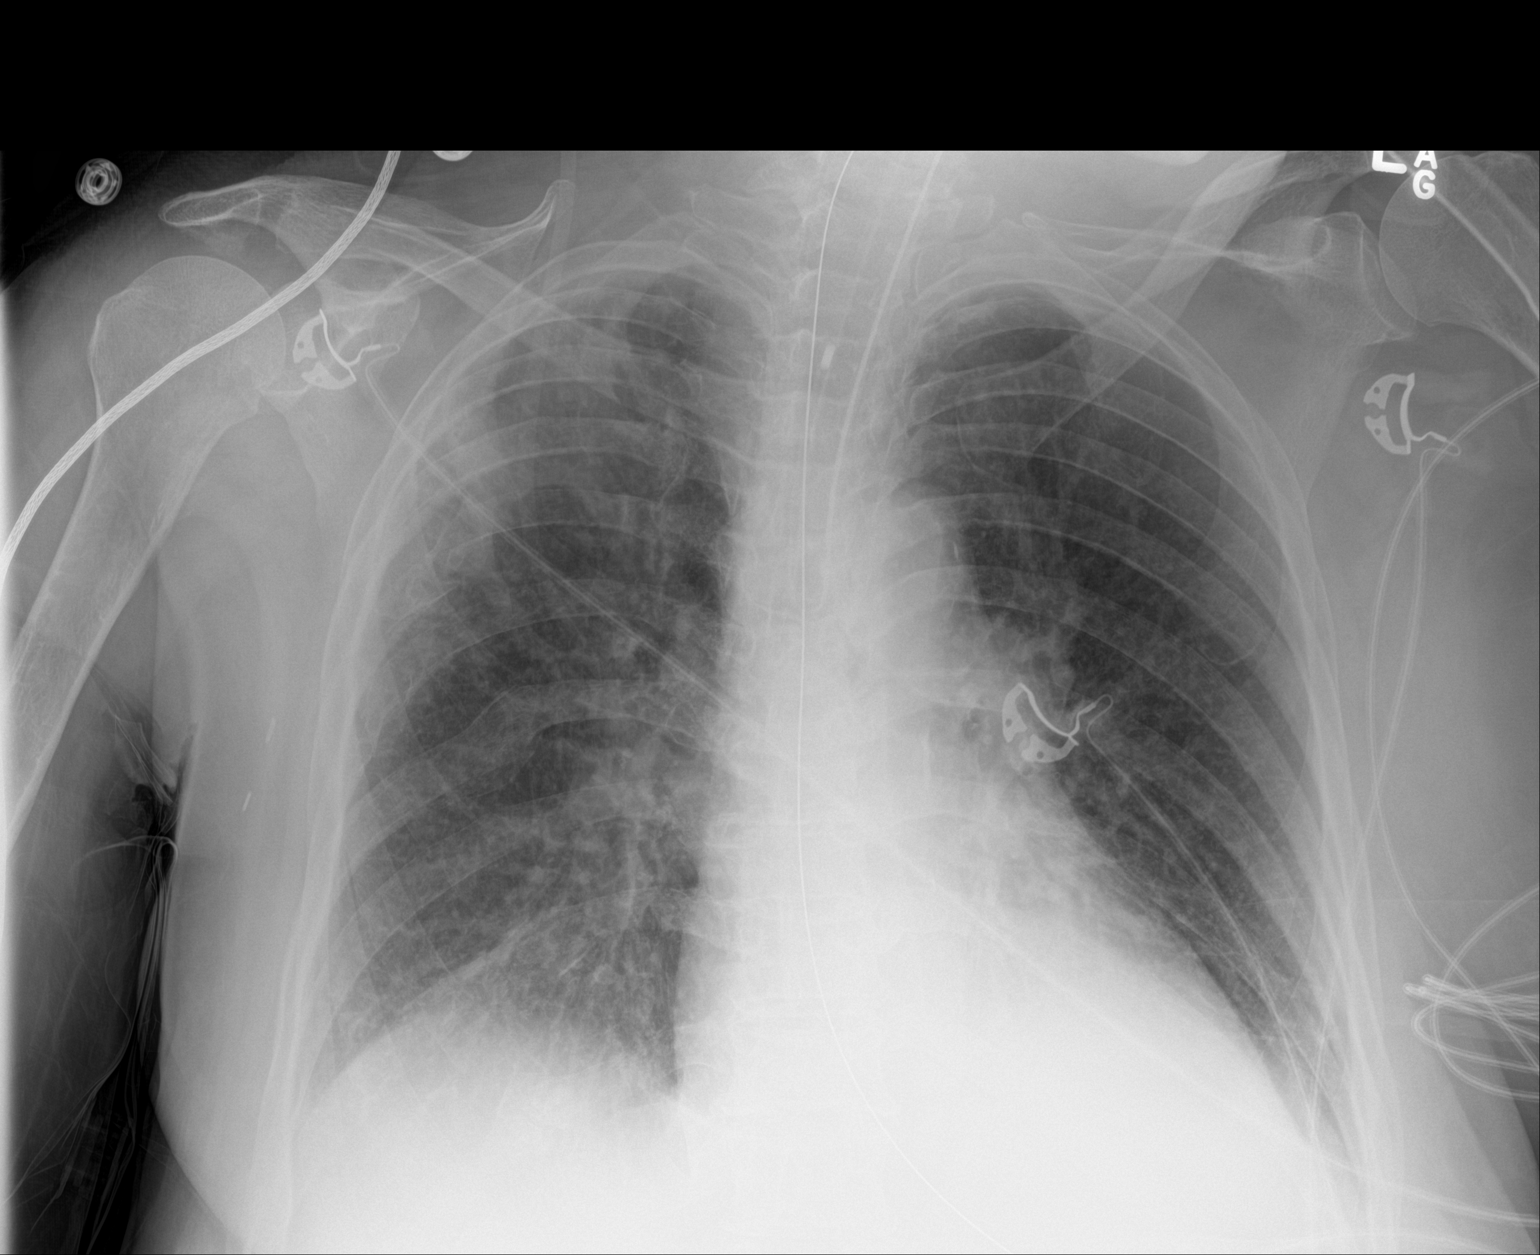

[1 of 1 positions shown; findings below may reference images not displayed]

FINDINGS: Endotracheal tube tip is 1 cm above the carina. An orogastric tube
reaches the stomach.

Right upper lobe airspace disease is mildly improved from yesterday.
Worsening aeration at the bases with indistinct diaphragm. This is
likely atelectasis. Differences in patient positioning could
contribute. No effusion or pneumothorax. Normal heart size and
mediastinal contours.
IMPRESSION: 1. Lower endotracheal tube, tip 1 cm above the carina.
2. Mild improvement in right upper lobe pneumonia.
3. Worsening aeration at the bases favoring atelectasis.

## 2018-07-15 IMAGING — DX DG CHEST 2V
2 series · 2 of 2 positions shown · non-contrast
Comparison: 05/18/2017.

CLINICAL DATA: Routine follow-up.  No complaints.

EXAM:
CHEST  2 VIEW

[chest pa]
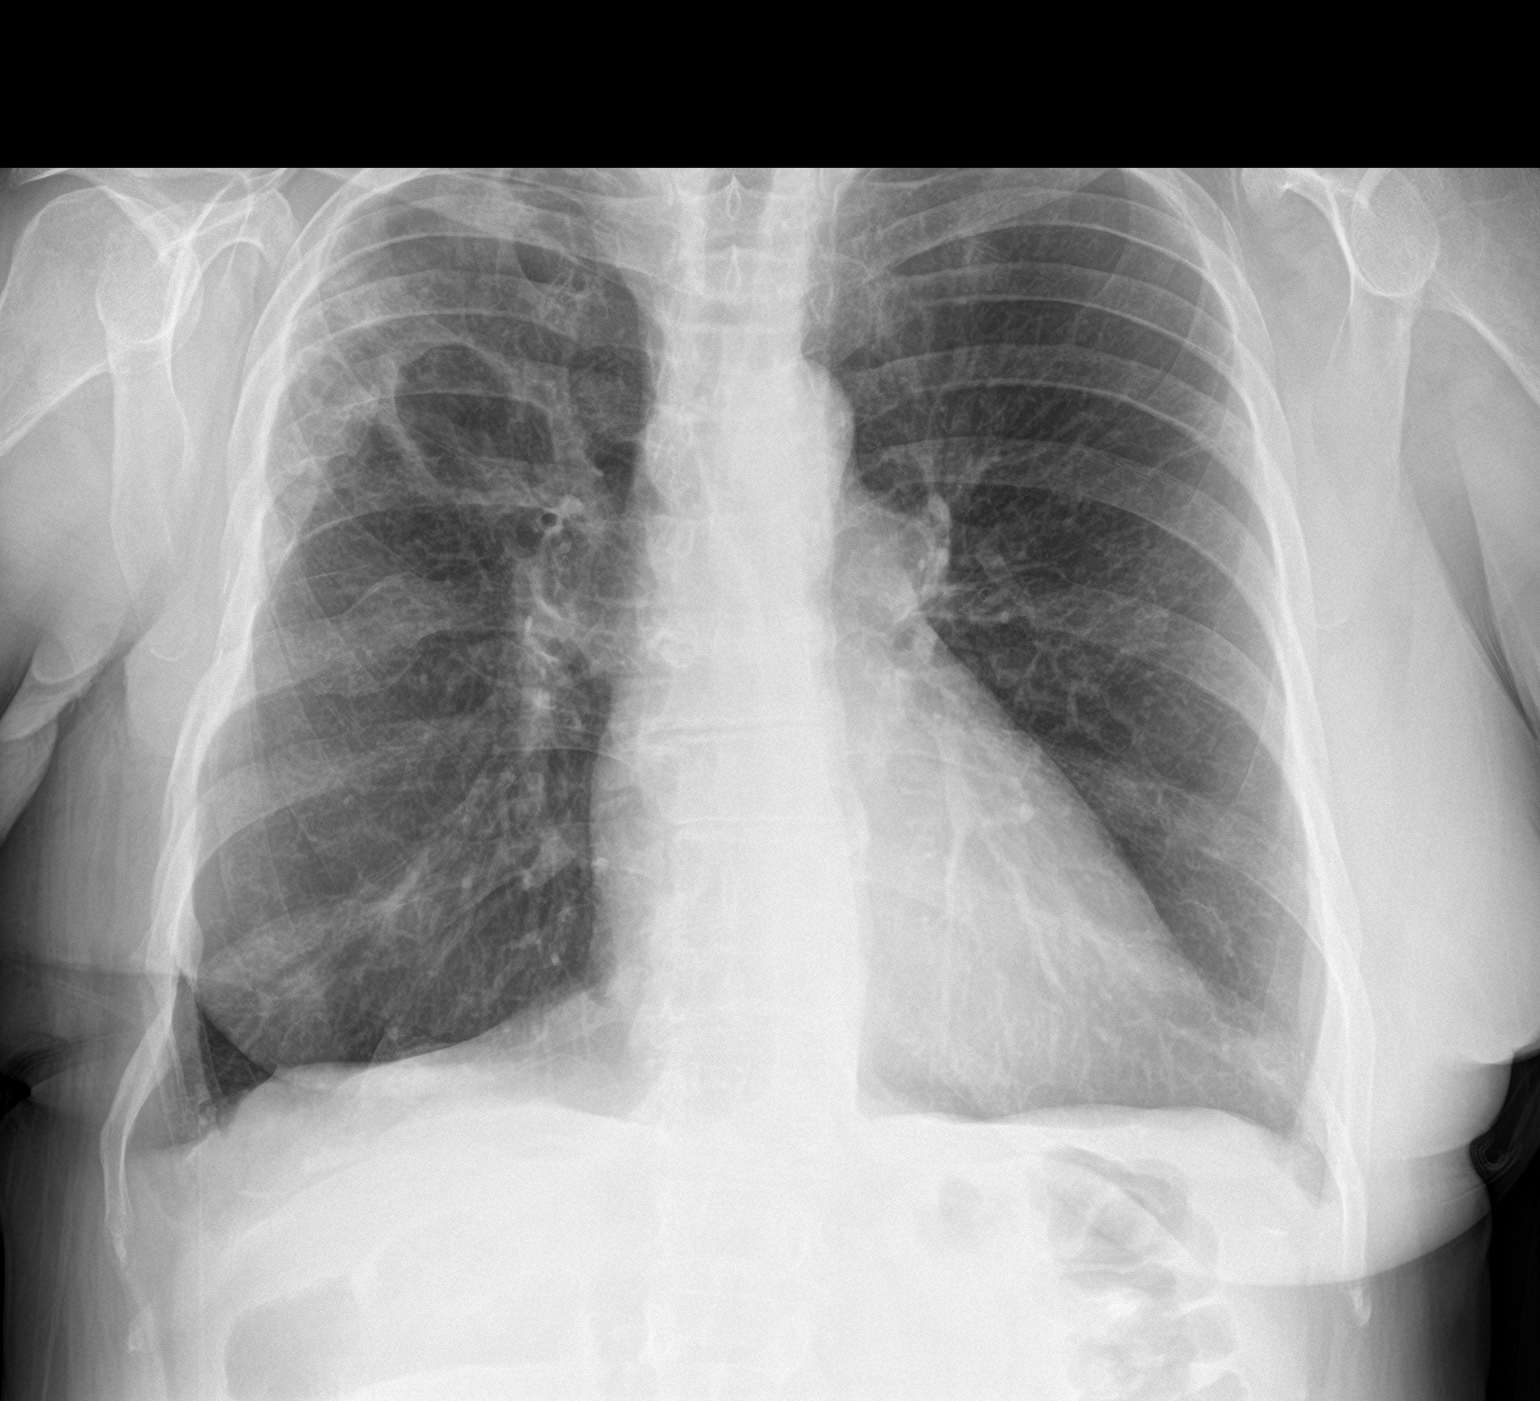

[chest lat]
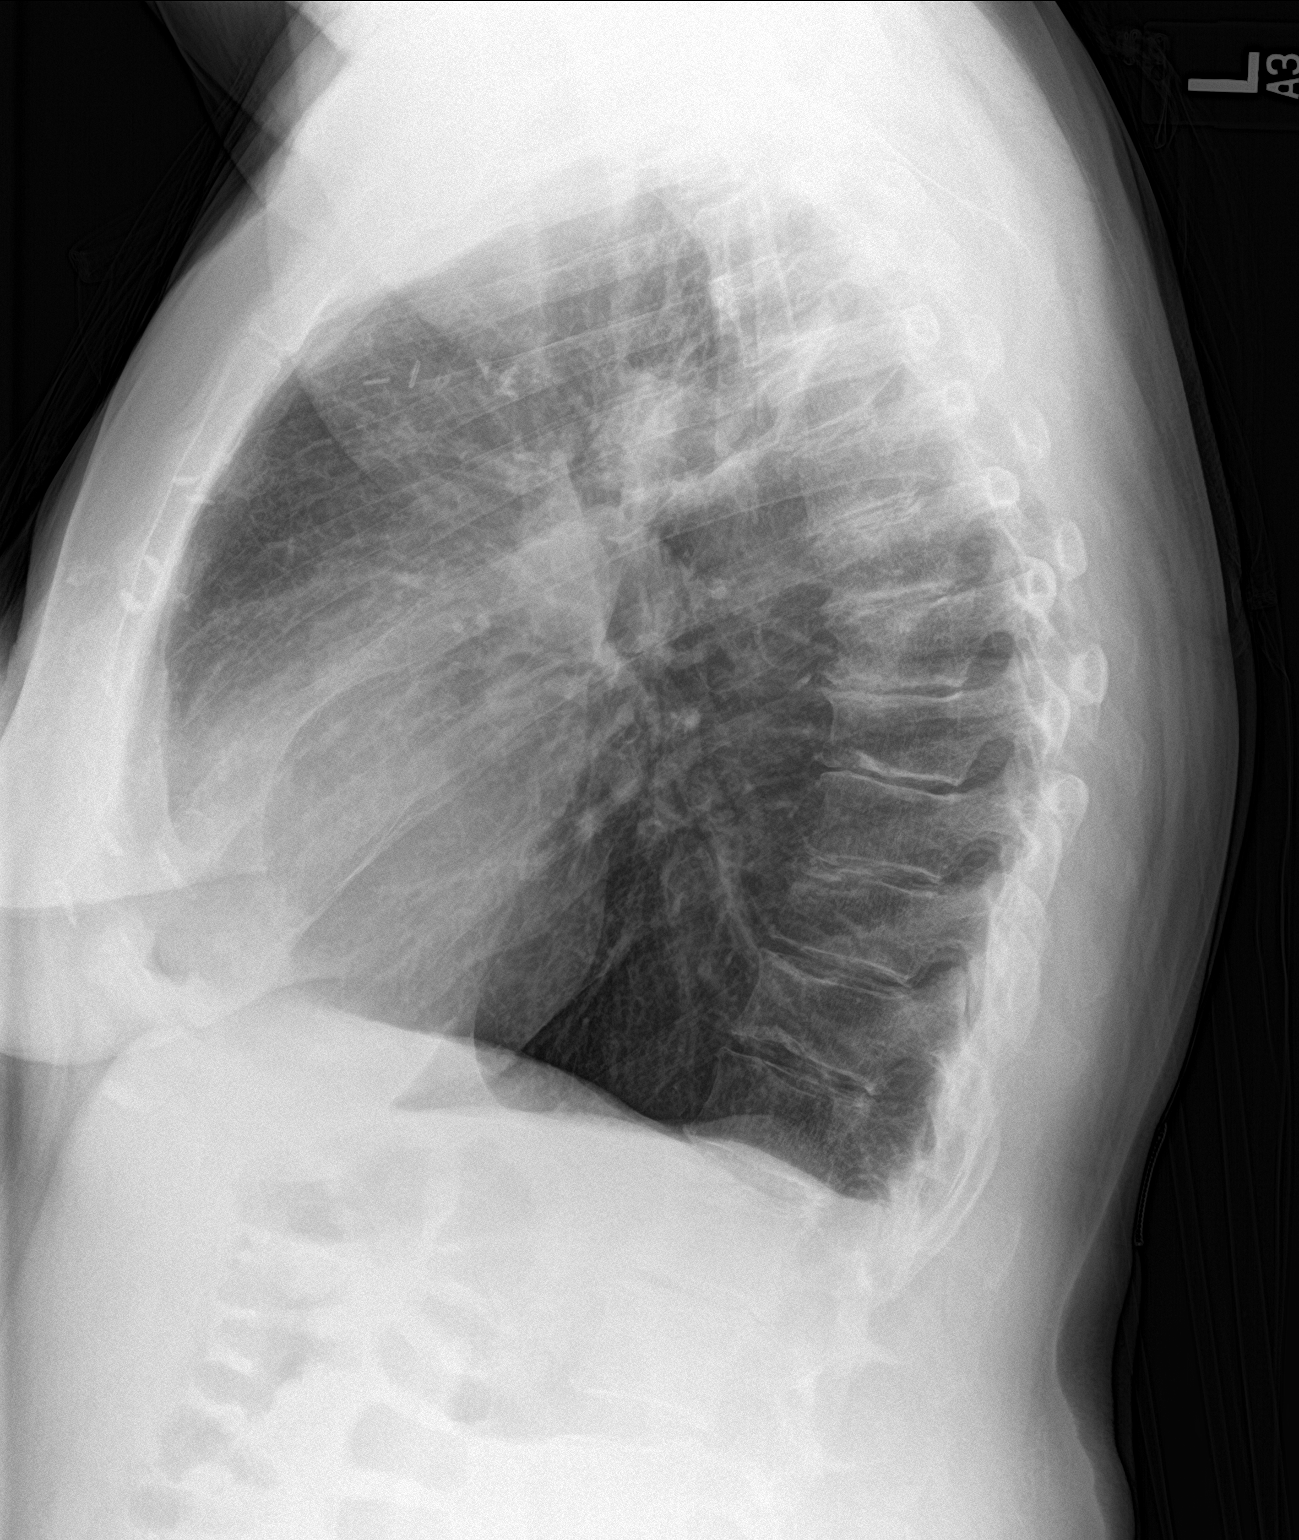

[2 of 2 positions shown; findings below may reference images not displayed]

FINDINGS: Patchy airspace consolidation RIGHT upper lobe slightly improved.
Still persistent opacity, recommended follow-up in 4-6 weeks. Air
cysts in the RIGHT lung apex are superimposed. The LEFT lung is
clear. There is no effusion or pneumothorax. Unchanged and
unremarkable cardiomediastinal silhouette.
IMPRESSION: Slight improvement in airspace consolidation RIGHT upper lobe, but
not yet clear. Continued surveillance warranted. Consider follow-up
in 4-6 weeks.

## 2018-07-22 IMAGING — CT CT CHEST W/ CM
2 of 3 series · 14 of 36 positions shown, 17 images · IV contrast (iopamidol)
Comparison: PET-CT 02/11/2017

CLINICAL DATA: Stage III squamous cell carcinoma of the right lung.
Status post chemo radiation

EXAM:
CT CHEST WITH CONTRAST
TECHNIQUE: Multidetector CT imaging of the chest was performed during
intravenous contrast administration.
CONTRAST:  75mL TY5DYX-LMM IOPAMIDOL (TY5DYX-LMM) INJECTION 61%

[Series 2: axial st · axial · 0.73mm/px · z∈[-301,-59]mm · 11 of 143 slices shown, 14 images]
[im 11/143  mediastinal]
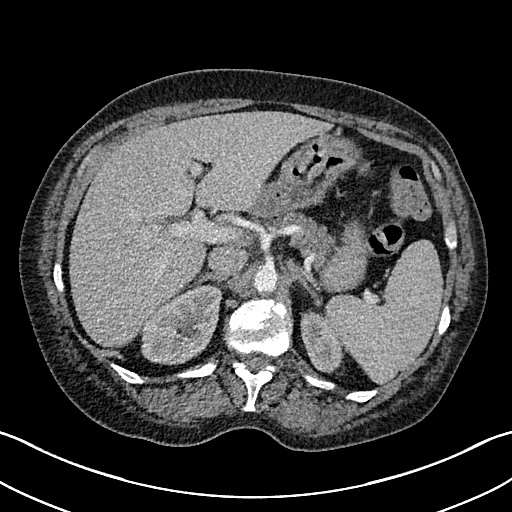
[im 11/143  lung]
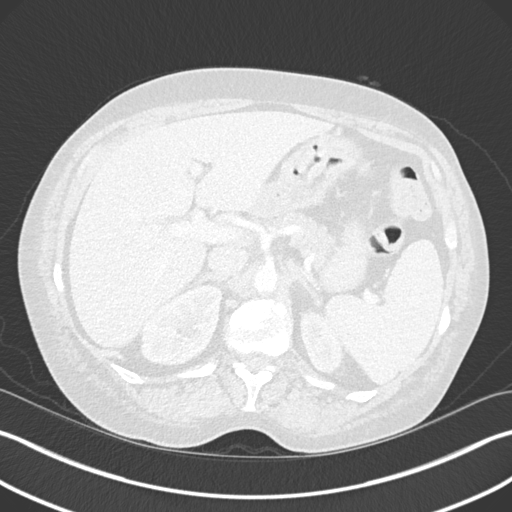
[im 22/143  lung]
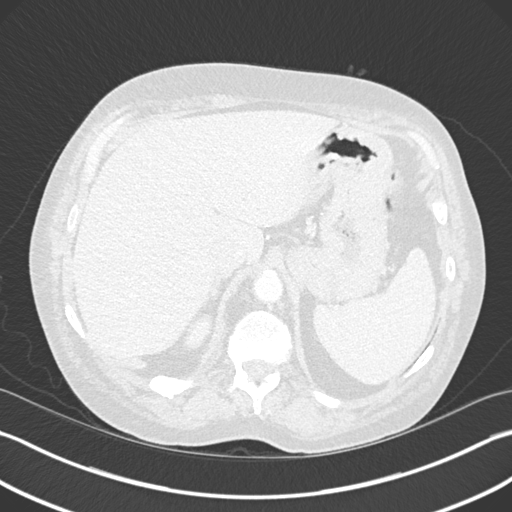
[im 32/143  lung]
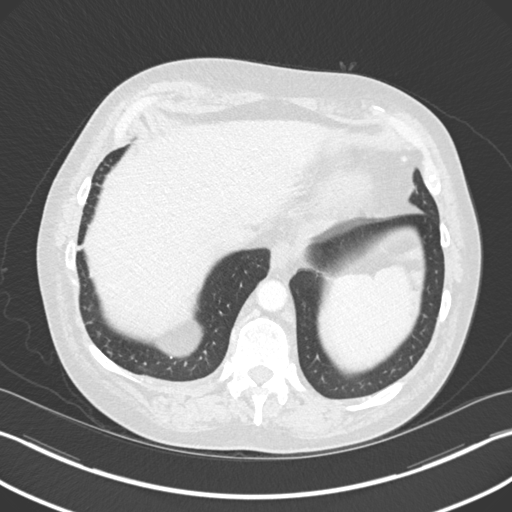
[im 48/143  lung]
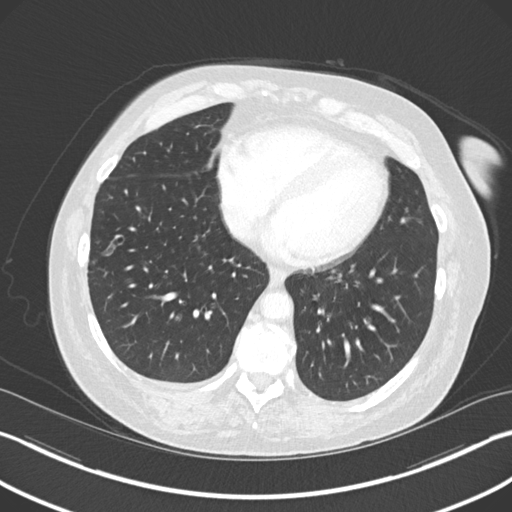
[im 58/143  mediastinal]
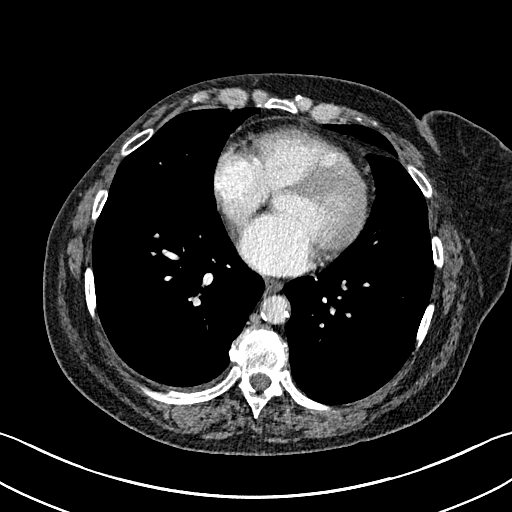
[im 58/143  lung]
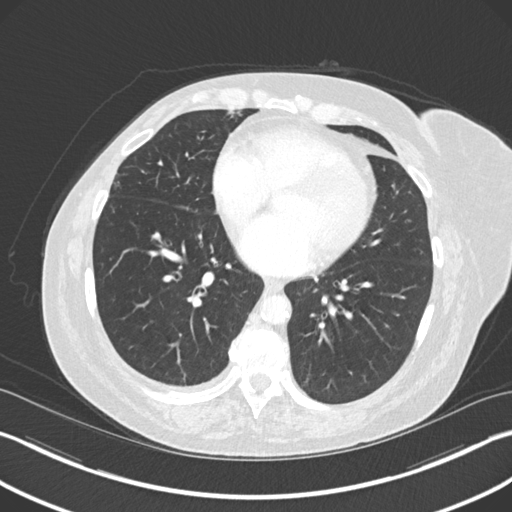
[im 74/143  lung]
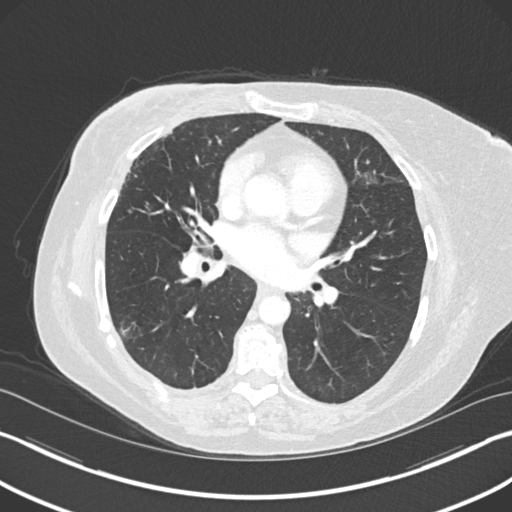
[im 85/143  lung]
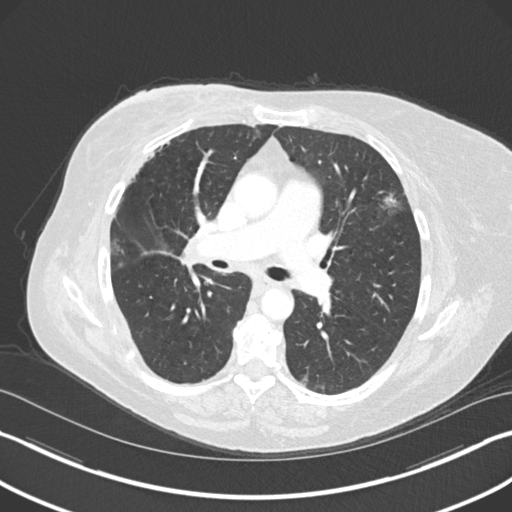
[im 95/143  lung]
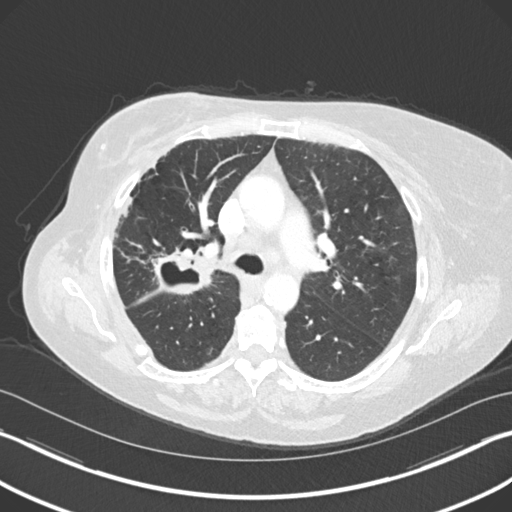
[im 111/143  mediastinal]
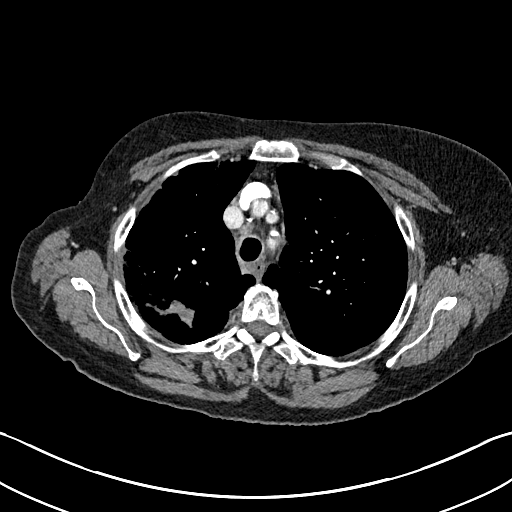
[im 111/143  lung]
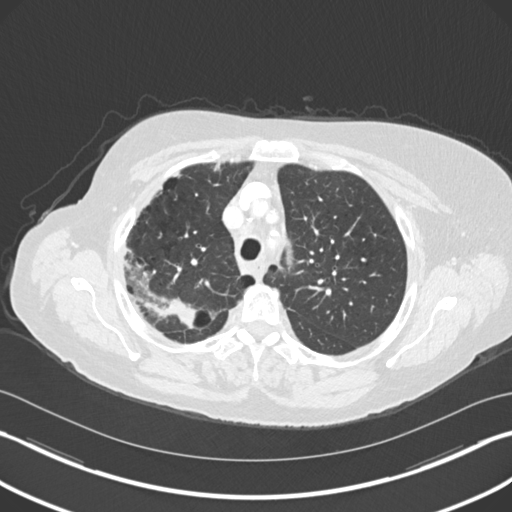
[im 121/143  lung]
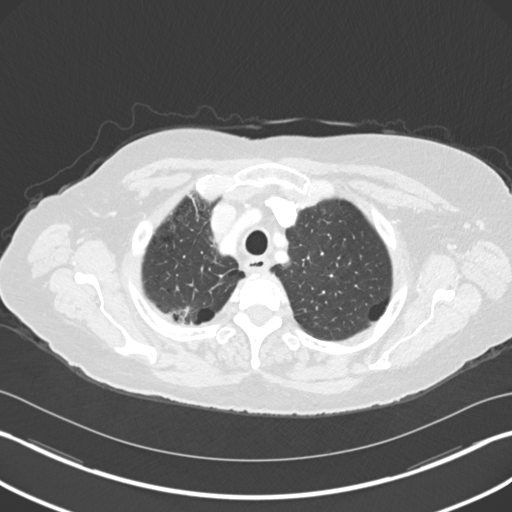
[im 132/143  lung]
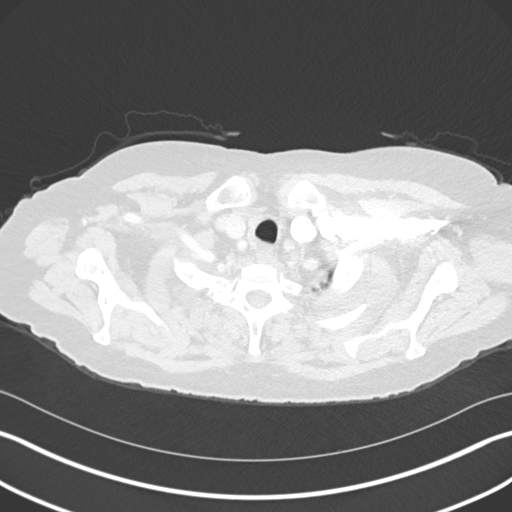

[Series 6: coronal · coronal · 0.57mm/px · 3 of 149 slices shown]
[im 30/149  lung]
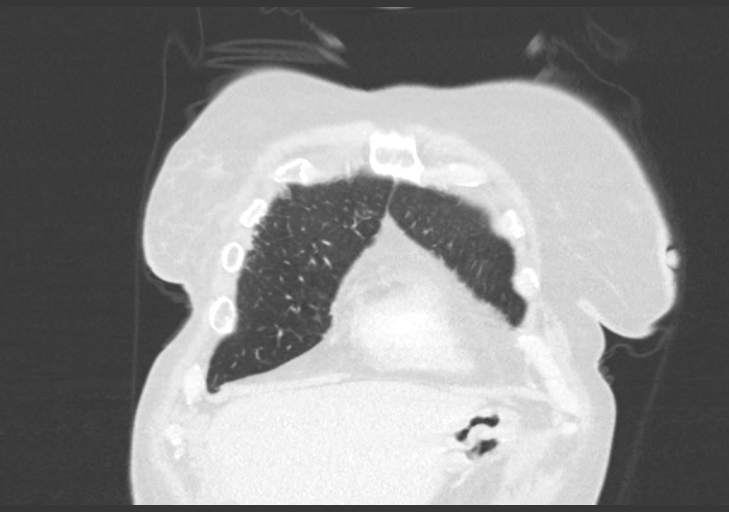
[im 60/149  lung]
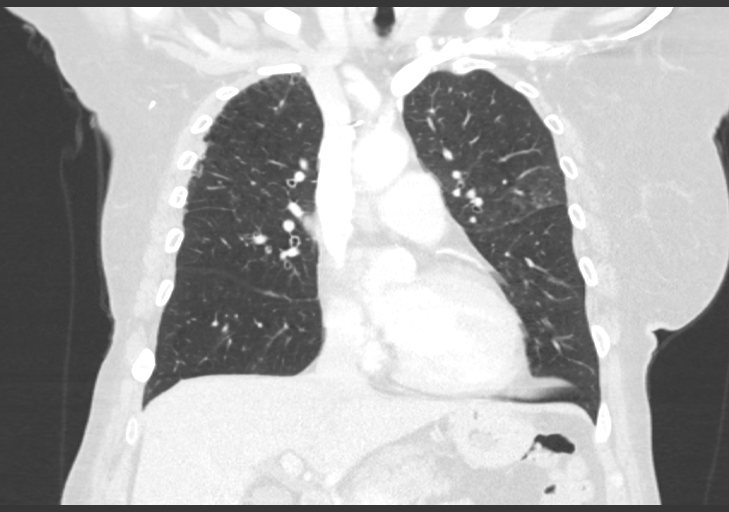
[im 89/149  lung]
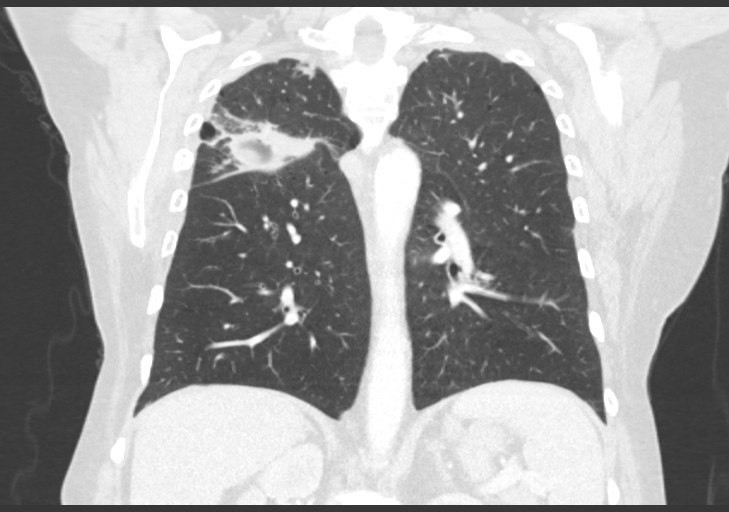

[14 of 36 positions shown; findings below may reference images not displayed]

FINDINGS: Cardiovascular: The heart size is normal. No pericardial effusion.
Coronary artery calcification is evident. Atherosclerotic
calcification is noted in the wall of the thoracic aorta.

Mediastinum/Nodes: 13 mm right paratracheal lymph node measured
previously is now 7 mm short axis. Other scattered small mediastinal
lymph nodes are evident. 10 mm short axis right hilar lymph node is
identified. There is no left hilar lymphadenopathy. The esophagus
has normal imaging features. There is no axillary lymphadenopathy.

Lungs/Pleura: Hypermetabolic posterior right upper lobe pulmonary
lesion seen on the previous PET-CT shows interval cavitation lesion
measures 6.0 x 3.4 cm (image 45 series 5) compared to 5.5 x 4.6 cm
previously.

14 mm subpleural nodule identified right lower lobe on image 66
series 5 is new in the interval. 9 mm right middle lobe subpleural
nodule is seen anteriorly on image 61 series 5 is new. 1.8 cm sub
solid nodule in the left upper lobe demonstrates centrilobular
tree-in-bud opacity and may be an area of infectious/inflammatory
bronchiolitis.

Right lower lobe bronchiectasis with small airway impaction is
similar to prior. No pulmonary edema or pleural effusion.

Upper Abdomen: Left hydronephrosis is incompletely visualized.

Musculoskeletal: Bone windows reveal no worrisome lytic or sclerotic
osseous lesions.
IMPRESSION: 1. Interval enlargement and cavitation of the right upper lobe
pulmonary mass consistent with squamous cell carcinoma.
2. Interval development of bilateral pulmonary nodules, 1 of which
has features suggesting infectious/inflammatory etiology. Close
continued attention recommended as metastatic disease not excluded.
3. Right paratracheal lymph node has decreased slightly in the
interval. Right hilar lymph node more evident on today's study with
intravenous contrast material.
4. Left hydronephrosis, incompletely visualized.
5. Coronary artery and Aortic Atherosclerois ([id number]-170.0)

## 2018-08-15 NOTE — Progress Notes (Signed)
This encounter was created in error - please disregard.

## 2019-03-15 IMAGING — CR DG CHEST 1V PORT
1 series · 1 of 1 positions shown · non-contrast
Comparison: 01/17/2018

CLINICAL DATA: Shortness of breath. History of breast cancer. COPD.
Prior radiation therapy to right lung. Right lung cancer.

EXAM:
PORTABLE CHEST 1 VIEW

[portable]
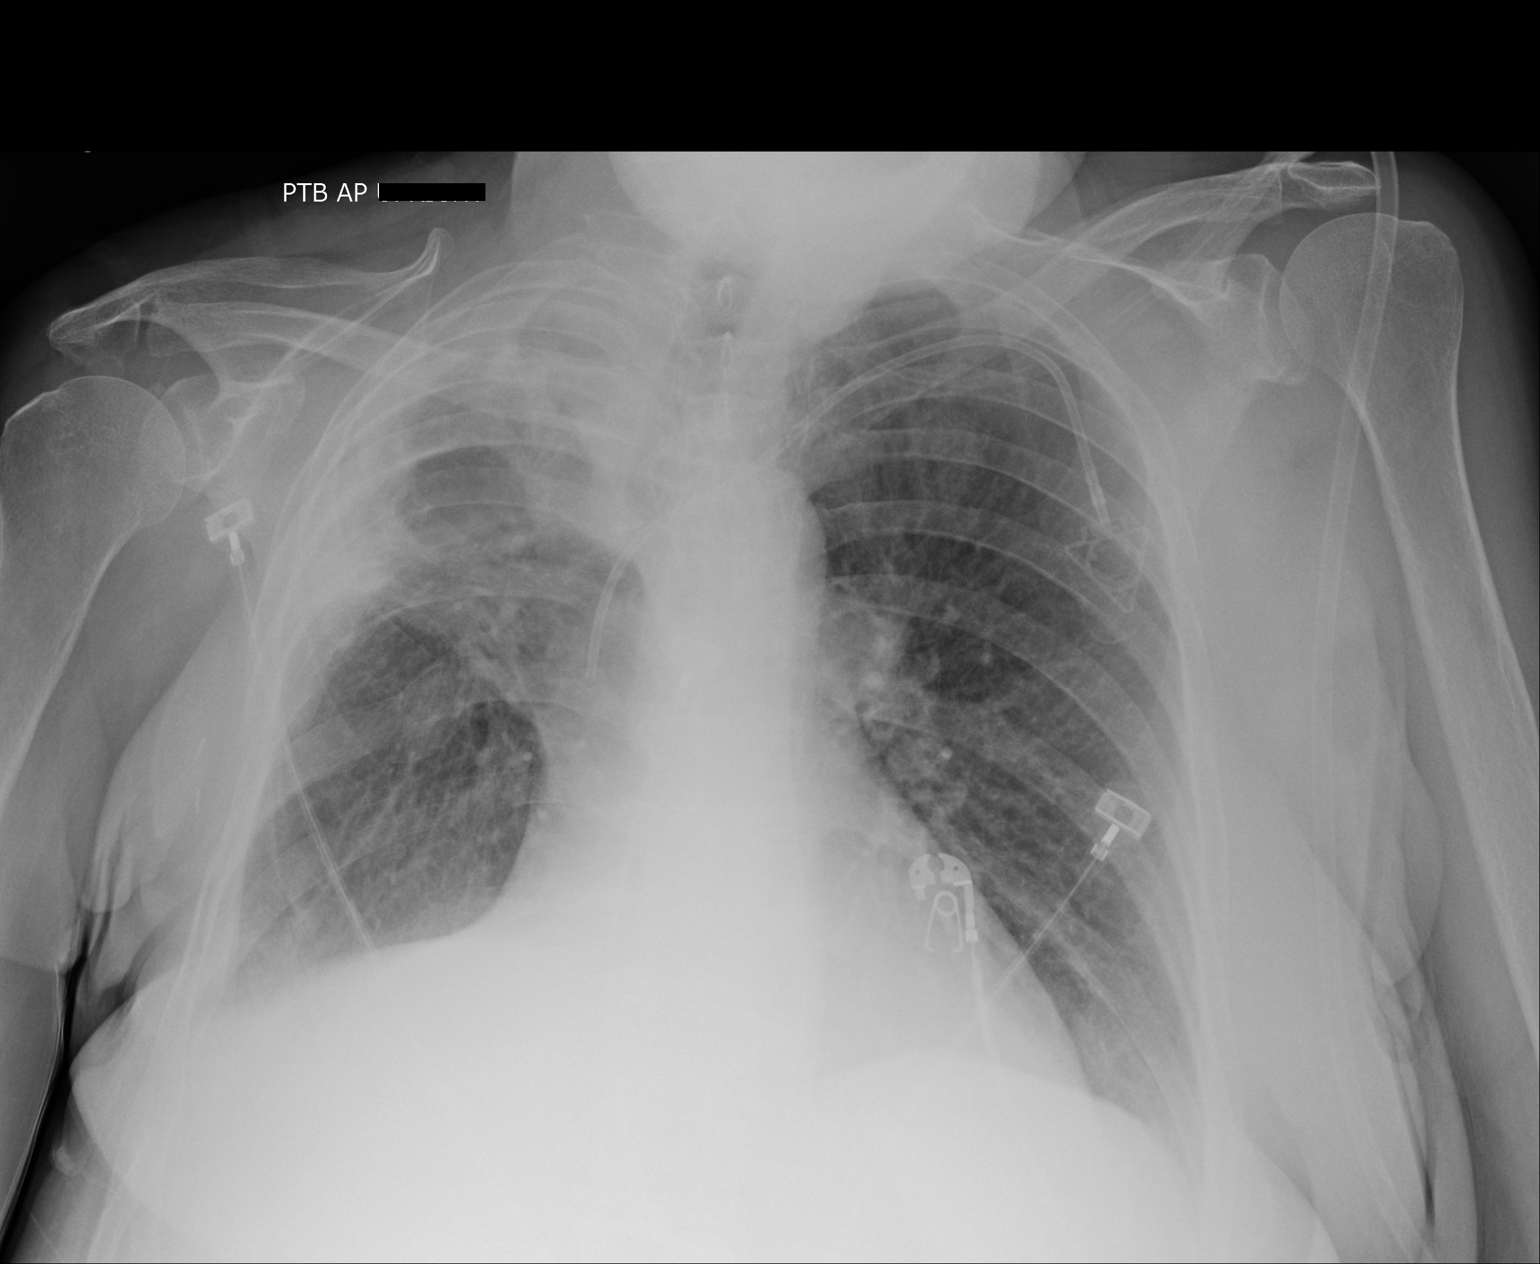

[1 of 1 positions shown; findings below may reference images not displayed]

FINDINGS: Left Port-A-Cath in place with the tip in the SVC. Chronic density
in the right upper lobe is stable since prior study, likely
postradiation changes. Small right pleural effusion and right base
atelectasis. No confluent opacity on the left. Heart is borderline
in size.
IMPRESSION: Stable chronic changes in the right upper lobe, likely postradiation
changes.

Small right effusion with right base atelectasis.
